# Patient Record
Sex: Male | Born: 1965 | Race: Black or African American | Hispanic: No | Smoking: Current every day smoker
Health system: Southern US, Community
[De-identification: ages and names within clinical notes are randomized; demographics above are authoritative.]

## PROBLEM LIST (undated history)

## (undated) DIAGNOSIS — G2581 Restless legs syndrome: Secondary | ICD-10-CM

## (undated) DIAGNOSIS — F32A Depression, unspecified: Secondary | ICD-10-CM

## (undated) DIAGNOSIS — F419 Anxiety disorder, unspecified: Secondary | ICD-10-CM

## (undated) DIAGNOSIS — E669 Obesity, unspecified: Secondary | ICD-10-CM

## (undated) DIAGNOSIS — E781 Pure hyperglyceridemia: Secondary | ICD-10-CM

## (undated) DIAGNOSIS — J309 Allergic rhinitis, unspecified: Secondary | ICD-10-CM

## (undated) DIAGNOSIS — M549 Dorsalgia, unspecified: Secondary | ICD-10-CM

## (undated) DIAGNOSIS — M5136 Other intervertebral disc degeneration, lumbar region: Secondary | ICD-10-CM

## (undated) DIAGNOSIS — E8881 Metabolic syndrome: Secondary | ICD-10-CM

## (undated) DIAGNOSIS — M109 Gout, unspecified: Secondary | ICD-10-CM

## (undated) DIAGNOSIS — G4733 Obstructive sleep apnea (adult) (pediatric): Secondary | ICD-10-CM

## (undated) DIAGNOSIS — K219 Gastro-esophageal reflux disease without esophagitis: Secondary | ICD-10-CM

## (undated) DIAGNOSIS — R519 Headache, unspecified: Secondary | ICD-10-CM

## (undated) DIAGNOSIS — F329 Major depressive disorder, single episode, unspecified: Secondary | ICD-10-CM

## (undated) DIAGNOSIS — N529 Male erectile dysfunction, unspecified: Secondary | ICD-10-CM

## (undated) DIAGNOSIS — T7840XA Allergy, unspecified, initial encounter: Secondary | ICD-10-CM

## (undated) DIAGNOSIS — M5432 Sciatica, left side: Secondary | ICD-10-CM

## (undated) DIAGNOSIS — Z8719 Personal history of other diseases of the digestive system: Secondary | ICD-10-CM

## (undated) DIAGNOSIS — R51 Headache: Secondary | ICD-10-CM

## (undated) DIAGNOSIS — G629 Polyneuropathy, unspecified: Secondary | ICD-10-CM

## (undated) DIAGNOSIS — K5900 Constipation, unspecified: Secondary | ICD-10-CM

## (undated) DIAGNOSIS — E785 Hyperlipidemia, unspecified: Secondary | ICD-10-CM

## (undated) DIAGNOSIS — E119 Type 2 diabetes mellitus without complications: Secondary | ICD-10-CM

## (undated) DIAGNOSIS — I10 Essential (primary) hypertension: Secondary | ICD-10-CM

## (undated) DIAGNOSIS — G8929 Other chronic pain: Secondary | ICD-10-CM

## (undated) DIAGNOSIS — S060X9A Concussion with loss of consciousness of unspecified duration, initial encounter: Secondary | ICD-10-CM

## (undated) HISTORY — DX: Other chronic pain: G89.29

## (undated) HISTORY — PX: ELBOW SURGERY: SHX618

## (undated) HISTORY — DX: Dorsalgia, unspecified: M54.9

## (undated) HISTORY — PX: TONSILLECTOMY AND ADENOIDECTOMY: SUR1326

## (undated) HISTORY — DX: Allergic rhinitis, unspecified: J30.9

## (undated) HISTORY — PX: SHOULDER SURGERY: SHX246

## (undated) HISTORY — PX: COLONOSCOPY: SHX174

## (undated) HISTORY — DX: Other intervertebral disc degeneration, lumbar region: M51.36

## (undated) HISTORY — DX: Male erectile dysfunction, unspecified: N52.9

## (undated) HISTORY — DX: Obesity, unspecified: E66.9

## (undated) HISTORY — DX: Major depressive disorder, single episode, unspecified: F32.9

## (undated) HISTORY — DX: Obstructive sleep apnea (adult) (pediatric): G47.33

## (undated) HISTORY — DX: Depression, unspecified: F32.A

## (undated) HISTORY — DX: Sciatica, left side: M54.32

## (undated) HISTORY — DX: Personal history of other diseases of the digestive system: Z87.19

## (undated) HISTORY — DX: Gastro-esophageal reflux disease without esophagitis: K21.9

## (undated) HISTORY — DX: Hyperlipidemia, unspecified: E78.5

## (undated) HISTORY — DX: Metabolic syndrome: E88.81

## (undated) HISTORY — DX: Essential (primary) hypertension: I10

## (undated) HISTORY — PX: KNEE SURGERY: SHX244

## (undated) HISTORY — DX: Gout, unspecified: M10.9

## (undated) HISTORY — DX: Allergy, unspecified, initial encounter: T78.40XA

## (undated) HISTORY — DX: Pure hyperglyceridemia: E78.1

---

## 2001-05-29 ENCOUNTER — Emergency Department (HOSPITAL_COMMUNITY): Admission: EM | Admit: 2001-05-29 | Discharge: 2001-05-29 | Payer: Self-pay

## 2001-06-06 ENCOUNTER — Encounter: Admission: RE | Admit: 2001-06-06 | Discharge: 2001-07-04 | Payer: Self-pay | Admitting: Family Medicine

## 2001-06-22 ENCOUNTER — Encounter: Admission: RE | Admit: 2001-06-22 | Discharge: 2001-06-22 | Payer: Self-pay | Admitting: Family Medicine

## 2001-06-22 ENCOUNTER — Encounter: Payer: Self-pay | Admitting: Family Medicine

## 2001-07-20 ENCOUNTER — Encounter: Admission: RE | Admit: 2001-07-20 | Discharge: 2001-10-18 | Payer: Self-pay

## 2002-02-07 ENCOUNTER — Emergency Department (HOSPITAL_COMMUNITY): Admission: EM | Admit: 2002-02-07 | Discharge: 2002-02-07 | Payer: Self-pay

## 2003-02-18 ENCOUNTER — Encounter: Admission: RE | Admit: 2003-02-18 | Discharge: 2003-02-18 | Payer: Self-pay | Admitting: Family Medicine

## 2003-05-06 ENCOUNTER — Encounter: Admission: RE | Admit: 2003-05-06 | Discharge: 2003-05-06 | Payer: Self-pay | Admitting: Otolaryngology

## 2003-05-22 ENCOUNTER — Ambulatory Visit (HOSPITAL_COMMUNITY): Admission: RE | Admit: 2003-05-22 | Discharge: 2003-05-22 | Payer: Self-pay | Admitting: Otolaryngology

## 2003-05-22 ENCOUNTER — Encounter (INDEPENDENT_AMBULATORY_CARE_PROVIDER_SITE_OTHER): Payer: Self-pay | Admitting: *Deleted

## 2004-02-10 ENCOUNTER — Ambulatory Visit: Payer: Self-pay | Admitting: Family Medicine

## 2004-04-02 ENCOUNTER — Ambulatory Visit: Payer: Self-pay | Admitting: Family Medicine

## 2004-05-31 ENCOUNTER — Ambulatory Visit: Payer: Self-pay | Admitting: Family Medicine

## 2004-06-14 ENCOUNTER — Ambulatory Visit: Payer: Self-pay | Admitting: Family Medicine

## 2004-08-13 ENCOUNTER — Ambulatory Visit: Payer: Self-pay | Admitting: Family Medicine

## 2004-08-15 ENCOUNTER — Encounter: Admission: RE | Admit: 2004-08-15 | Discharge: 2004-08-15 | Payer: Self-pay | Admitting: Family Medicine

## 2004-08-21 ENCOUNTER — Encounter: Admission: RE | Admit: 2004-08-21 | Discharge: 2004-08-21 | Payer: Self-pay | Admitting: Family Medicine

## 2004-08-26 ENCOUNTER — Ambulatory Visit: Payer: Self-pay | Admitting: Family Medicine

## 2004-11-18 ENCOUNTER — Ambulatory Visit: Payer: Self-pay | Admitting: Family Medicine

## 2004-12-06 ENCOUNTER — Ambulatory Visit: Payer: Self-pay | Admitting: Family Medicine

## 2004-12-15 ENCOUNTER — Emergency Department (HOSPITAL_COMMUNITY): Admission: EM | Admit: 2004-12-15 | Discharge: 2004-12-15 | Payer: Self-pay | Admitting: Emergency Medicine

## 2004-12-16 ENCOUNTER — Ambulatory Visit: Payer: Self-pay | Admitting: Family Medicine

## 2005-01-06 ENCOUNTER — Ambulatory Visit: Payer: Self-pay | Admitting: Family Medicine

## 2005-01-10 ENCOUNTER — Encounter: Admission: RE | Admit: 2005-01-10 | Discharge: 2005-01-10 | Payer: Self-pay | Admitting: Specialist

## 2005-01-12 ENCOUNTER — Encounter: Admission: RE | Admit: 2005-01-12 | Discharge: 2005-01-12 | Payer: Self-pay | Admitting: Specialist

## 2005-01-18 ENCOUNTER — Encounter: Admission: RE | Admit: 2005-01-18 | Discharge: 2005-01-18 | Payer: Self-pay | Admitting: Specialist

## 2005-02-22 ENCOUNTER — Ambulatory Visit: Payer: Self-pay | Admitting: Family Medicine

## 2005-05-26 ENCOUNTER — Ambulatory Visit: Payer: Self-pay | Admitting: Family Medicine

## 2005-07-12 ENCOUNTER — Ambulatory Visit: Payer: Self-pay | Admitting: Family Medicine

## 2005-09-12 ENCOUNTER — Ambulatory Visit: Payer: Self-pay | Admitting: Family Medicine

## 2006-10-24 DIAGNOSIS — E8881 Metabolic syndrome: Secondary | ICD-10-CM

## 2006-10-24 DIAGNOSIS — E669 Obesity, unspecified: Secondary | ICD-10-CM | POA: Insufficient documentation

## 2006-10-24 HISTORY — DX: Metabolic syndrome: E88.81

## 2006-12-21 ENCOUNTER — Telehealth: Payer: Self-pay | Admitting: Family Medicine

## 2007-05-01 ENCOUNTER — Ambulatory Visit: Payer: Self-pay | Admitting: Family Medicine

## 2007-05-01 DIAGNOSIS — M109 Gout, unspecified: Secondary | ICD-10-CM

## 2007-05-01 DIAGNOSIS — IMO0002 Reserved for concepts with insufficient information to code with codable children: Secondary | ICD-10-CM | POA: Insufficient documentation

## 2007-05-01 HISTORY — DX: Gout, unspecified: M10.9

## 2007-05-04 ENCOUNTER — Ambulatory Visit: Payer: Self-pay | Admitting: Family Medicine

## 2007-05-15 ENCOUNTER — Telehealth: Payer: Self-pay | Admitting: Family Medicine

## 2007-06-21 ENCOUNTER — Telehealth (INDEPENDENT_AMBULATORY_CARE_PROVIDER_SITE_OTHER): Payer: Self-pay | Admitting: *Deleted

## 2007-06-29 ENCOUNTER — Ambulatory Visit: Payer: Self-pay | Admitting: Family Medicine

## 2007-06-29 DIAGNOSIS — J069 Acute upper respiratory infection, unspecified: Secondary | ICD-10-CM | POA: Insufficient documentation

## 2007-08-28 ENCOUNTER — Telehealth: Payer: Self-pay | Admitting: *Deleted

## 2007-08-30 ENCOUNTER — Emergency Department (HOSPITAL_COMMUNITY): Admission: EM | Admit: 2007-08-30 | Discharge: 2007-08-31 | Payer: Self-pay | Admitting: Emergency Medicine

## 2007-09-13 ENCOUNTER — Telehealth: Payer: Self-pay | Admitting: Family Medicine

## 2007-09-13 ENCOUNTER — Encounter: Payer: Self-pay | Admitting: *Deleted

## 2007-10-19 ENCOUNTER — Emergency Department (HOSPITAL_COMMUNITY): Admission: EM | Admit: 2007-10-19 | Discharge: 2007-10-19 | Payer: Self-pay | Admitting: Emergency Medicine

## 2007-10-23 ENCOUNTER — Telehealth: Payer: Self-pay | Admitting: Internal Medicine

## 2007-11-08 ENCOUNTER — Ambulatory Visit: Payer: Self-pay | Admitting: Family Medicine

## 2007-11-08 LAB — CONVERTED CEMR LAB
BUN: 11 mg/dL (ref 6–23)
CO2: 29 meq/L (ref 19–32)
Calcium: 9.6 mg/dL (ref 8.4–10.5)
Chloride: 101 meq/L (ref 96–112)
Creatinine, Ser: 1 mg/dL (ref 0.4–1.5)
GFR calc Af Amer: 106 mL/min
GFR calc non Af Amer: 88 mL/min
Glucose, Bld: 109 mg/dL — ABNORMAL HIGH (ref 70–99)
Potassium: 3.8 meq/L (ref 3.5–5.1)
Sodium: 140 meq/L (ref 135–145)
Uric Acid, Serum: 4.6 mg/dL (ref 4.0–7.8)

## 2007-11-10 ENCOUNTER — Telehealth: Payer: Self-pay | Admitting: Family Medicine

## 2008-01-01 ENCOUNTER — Telehealth: Payer: Self-pay | Admitting: *Deleted

## 2008-01-24 ENCOUNTER — Emergency Department (HOSPITAL_COMMUNITY): Admission: EM | Admit: 2008-01-24 | Discharge: 2008-01-24 | Payer: Self-pay | Admitting: Emergency Medicine

## 2008-02-20 ENCOUNTER — Ambulatory Visit: Payer: Self-pay | Admitting: Family Medicine

## 2008-02-20 LAB — CONVERTED CEMR LAB: Uric Acid, Serum: 2.3 mg/dL — ABNORMAL LOW (ref 4.0–7.8)

## 2008-03-07 ENCOUNTER — Telehealth: Payer: Self-pay | Admitting: Family Medicine

## 2008-03-09 ENCOUNTER — Emergency Department (HOSPITAL_COMMUNITY): Admission: EM | Admit: 2008-03-09 | Discharge: 2008-03-09 | Payer: Self-pay | Admitting: Emergency Medicine

## 2008-03-11 ENCOUNTER — Ambulatory Visit: Payer: Self-pay | Admitting: Family Medicine

## 2008-03-11 DIAGNOSIS — F32A Depression, unspecified: Secondary | ICD-10-CM | POA: Insufficient documentation

## 2008-03-11 DIAGNOSIS — I10 Essential (primary) hypertension: Secondary | ICD-10-CM

## 2008-03-11 DIAGNOSIS — M79609 Pain in unspecified limb: Secondary | ICD-10-CM | POA: Insufficient documentation

## 2008-03-11 DIAGNOSIS — F329 Major depressive disorder, single episode, unspecified: Secondary | ICD-10-CM

## 2008-03-11 HISTORY — DX: Essential (primary) hypertension: I10

## 2008-04-21 ENCOUNTER — Emergency Department (HOSPITAL_COMMUNITY): Admission: EM | Admit: 2008-04-21 | Discharge: 2008-04-21 | Payer: Self-pay | Admitting: Emergency Medicine

## 2008-04-21 DIAGNOSIS — M25519 Pain in unspecified shoulder: Secondary | ICD-10-CM | POA: Insufficient documentation

## 2008-04-25 ENCOUNTER — Ambulatory Visit: Payer: Self-pay | Admitting: Family Medicine

## 2008-06-20 ENCOUNTER — Ambulatory Visit: Payer: Self-pay | Admitting: Family Medicine

## 2008-07-02 ENCOUNTER — Emergency Department (HOSPITAL_COMMUNITY): Admission: EM | Admit: 2008-07-02 | Discharge: 2008-07-02 | Payer: Self-pay | Admitting: Emergency Medicine

## 2008-07-07 ENCOUNTER — Telehealth: Payer: Self-pay | Admitting: Family Medicine

## 2008-07-17 ENCOUNTER — Ambulatory Visit (HOSPITAL_COMMUNITY): Admission: RE | Admit: 2008-07-17 | Discharge: 2008-07-17 | Payer: Self-pay | Admitting: Orthopedic Surgery

## 2008-08-08 ENCOUNTER — Ambulatory Visit (HOSPITAL_COMMUNITY): Admission: RE | Admit: 2008-08-08 | Discharge: 2008-08-08 | Payer: Self-pay | Admitting: Orthopedic Surgery

## 2008-09-19 ENCOUNTER — Telehealth: Payer: Self-pay | Admitting: Family Medicine

## 2008-09-30 ENCOUNTER — Emergency Department (HOSPITAL_COMMUNITY): Admission: EM | Admit: 2008-09-30 | Discharge: 2008-09-30 | Payer: Self-pay | Admitting: Emergency Medicine

## 2009-03-19 ENCOUNTER — Emergency Department (HOSPITAL_COMMUNITY): Admission: EM | Admit: 2009-03-19 | Discharge: 2009-03-19 | Payer: Self-pay | Admitting: Emergency Medicine

## 2009-05-07 ENCOUNTER — Observation Stay (HOSPITAL_COMMUNITY): Admission: EM | Admit: 2009-05-07 | Discharge: 2009-05-08 | Payer: Self-pay | Admitting: Emergency Medicine

## 2009-05-19 ENCOUNTER — Ambulatory Visit: Payer: Self-pay | Admitting: Family Medicine

## 2009-05-19 DIAGNOSIS — Z8719 Personal history of other diseases of the digestive system: Secondary | ICD-10-CM | POA: Insufficient documentation

## 2009-05-19 LAB — CONVERTED CEMR LAB
ALT: 23 units/L (ref 0–53)
AST: 22 units/L (ref 0–37)
Albumin: 4.1 g/dL (ref 3.5–5.2)
Alkaline Phosphatase: 62 units/L (ref 39–117)
BUN: 14 mg/dL (ref 6–23)
Basophils Relative: 0 % (ref 0.0–3.0)
Bilirubin, Direct: 0 mg/dL (ref 0.0–0.3)
CO2: 28 meq/L (ref 19–32)
Calcium: 9.8 mg/dL (ref 8.4–10.5)
Chloride: 101 meq/L (ref 96–112)
Cholesterol: 280 mg/dL — ABNORMAL HIGH (ref 0–200)
Creatinine, Ser: 1.1 mg/dL (ref 0.4–1.5)
Direct LDL: 68.4 mg/dL
Eosinophils Relative: 0 % (ref 0.0–5.0)
GFR calc non Af Amer: 94.49 mL/min (ref >60)
Glucose, Bld: 93 mg/dL (ref 70–99)
HCT: 41.8 % (ref 39.0–52.0)
HDL: 36 mg/dL — ABNORMAL LOW (ref >39.00)
Hemoglobin: 14.4 g/dL (ref 13.0–17.0)
Lymphocytes Relative: 24 % (ref 12.0–46.0)
MCHC: 34.4 g/dL (ref 30.0–36.0)
MCV: 98.8 fL (ref 78.0–100.0)
Monocytes Relative: 2 % — ABNORMAL LOW (ref 3.0–12.0)
Neutrophils Relative %: 74 % (ref 43.0–77.0)
Platelets: 483 10*3/uL — ABNORMAL HIGH (ref 150.0–400.0)
Potassium: 4.5 meq/L (ref 3.5–5.1)
RBC: 4.23 M/uL (ref 4.22–5.81)
RDW: 13.4 % (ref 11.5–14.6)
Sodium: 137 meq/L (ref 135–145)
TSH: 0.25 microintl units/mL — ABNORMAL LOW (ref 0.35–5.50)
Total Bilirubin: 0 mg/dL — ABNORMAL LOW (ref 0.3–1.2)
Total CHOL/HDL Ratio: 8
Total Protein: 7 g/dL (ref 6.0–8.3)
Triglycerides: 1685 mg/dL — ABNORMAL HIGH (ref 0.0–149.0)
VLDL: 337 mg/dL — ABNORMAL HIGH (ref 0.0–40.0)
WBC: 10.2 10*3/uL (ref 4.5–10.5)

## 2009-05-26 ENCOUNTER — Ambulatory Visit: Payer: Self-pay | Admitting: Family Medicine

## 2009-05-26 DIAGNOSIS — E781 Pure hyperglyceridemia: Secondary | ICD-10-CM

## 2009-05-26 HISTORY — DX: Pure hyperglyceridemia: E78.1

## 2009-06-18 ENCOUNTER — Telehealth: Payer: Self-pay | Admitting: Family Medicine

## 2009-07-03 ENCOUNTER — Telehealth: Payer: Self-pay | Admitting: Family Medicine

## 2009-07-29 ENCOUNTER — Telehealth: Payer: Self-pay | Admitting: Gastroenterology

## 2009-07-30 ENCOUNTER — Ambulatory Visit: Payer: Self-pay | Admitting: Family Medicine

## 2009-07-30 DIAGNOSIS — B372 Candidiasis of skin and nail: Secondary | ICD-10-CM | POA: Insufficient documentation

## 2009-07-31 ENCOUNTER — Telehealth: Payer: Self-pay | Admitting: Family Medicine

## 2009-07-31 LAB — CONVERTED CEMR LAB
Cholesterol: 229 mg/dL — ABNORMAL HIGH (ref 0–200)
Direct LDL: 60.7 mg/dL
HDL: 45 mg/dL (ref >39.00)
Total CHOL/HDL Ratio: 5
Triglycerides: 923 mg/dL — ABNORMAL HIGH (ref 0.0–149.0)
VLDL: 184.6 mg/dL — ABNORMAL HIGH (ref 0.0–40.0)

## 2009-09-04 ENCOUNTER — Telehealth: Payer: Self-pay | Admitting: Family Medicine

## 2009-09-07 ENCOUNTER — Telehealth: Payer: Self-pay | Admitting: Family Medicine

## 2009-09-11 ENCOUNTER — Telehealth: Payer: Self-pay | Admitting: Family Medicine

## 2009-10-04 DIAGNOSIS — T07XXXA Unspecified multiple injuries, initial encounter: Secondary | ICD-10-CM | POA: Insufficient documentation

## 2009-10-05 ENCOUNTER — Emergency Department (HOSPITAL_COMMUNITY): Admission: EM | Admit: 2009-10-05 | Discharge: 2009-10-05 | Payer: Self-pay | Admitting: Emergency Medicine

## 2009-10-06 ENCOUNTER — Ambulatory Visit: Payer: Self-pay | Admitting: Family Medicine

## 2009-10-20 ENCOUNTER — Telehealth: Payer: Self-pay | Admitting: Internal Medicine

## 2009-10-29 ENCOUNTER — Telehealth: Payer: Self-pay | Admitting: Family Medicine

## 2009-12-04 ENCOUNTER — Ambulatory Visit: Payer: Self-pay | Admitting: Family Medicine

## 2009-12-11 ENCOUNTER — Ambulatory Visit: Payer: Self-pay | Admitting: Family Medicine

## 2010-01-11 ENCOUNTER — Emergency Department (HOSPITAL_COMMUNITY)
Admission: EM | Admit: 2010-01-11 | Discharge: 2010-01-11 | Payer: Self-pay | Source: Home / Self Care | Admitting: Emergency Medicine

## 2010-01-29 ENCOUNTER — Ambulatory Visit: Payer: Self-pay | Admitting: Family Medicine

## 2010-01-29 DIAGNOSIS — H109 Unspecified conjunctivitis: Secondary | ICD-10-CM | POA: Insufficient documentation

## 2010-01-29 DIAGNOSIS — R300 Dysuria: Secondary | ICD-10-CM | POA: Insufficient documentation

## 2010-01-29 LAB — CONVERTED CEMR LAB
Bilirubin Urine: NEGATIVE
Glucose, Urine, Semiquant: NEGATIVE
Ketones, urine, test strip: NEGATIVE
Nitrite: NEGATIVE
Protein, U semiquant: 30
Specific Gravity, Urine: <1.005
Urobilinogen, UA: 0.2
pH: 7

## 2010-01-30 ENCOUNTER — Encounter: Payer: Self-pay | Admitting: Family Medicine

## 2010-02-23 ENCOUNTER — Ambulatory Visit: Payer: Self-pay | Admitting: Family Medicine

## 2010-02-23 DIAGNOSIS — J309 Allergic rhinitis, unspecified: Secondary | ICD-10-CM | POA: Insufficient documentation

## 2010-02-23 LAB — CONVERTED CEMR LAB
ALT: 30 units/L (ref 0–53)
AST: 46 units/L — ABNORMAL HIGH (ref 0–37)
Albumin: 4.5 g/dL (ref 3.5–5.2)
Alkaline Phosphatase: 77 units/L (ref 39–117)
Bilirubin, Direct: 0 mg/dL (ref 0.0–0.3)
Cholesterol: 242 mg/dL — ABNORMAL HIGH (ref 0–200)
HDL: 33.9 mg/dL — ABNORMAL LOW (ref >39.00)
Total Bilirubin: 0.2 mg/dL — ABNORMAL LOW (ref 0.3–1.2)
Total CHOL/HDL Ratio: 7
Total Protein: 6.9 g/dL (ref 6.0–8.3)
Triglycerides: 2924 mg/dL — ABNORMAL HIGH (ref 0.0–149.0)
VLDL: 584.8 mg/dL — ABNORMAL HIGH (ref 0.0–40.0)

## 2010-03-02 ENCOUNTER — Ambulatory Visit: Payer: Self-pay | Admitting: Family Medicine

## 2010-03-08 ENCOUNTER — Ambulatory Visit: Payer: Self-pay | Admitting: Cardiology

## 2010-03-22 ENCOUNTER — Emergency Department (HOSPITAL_COMMUNITY)
Admission: EM | Admit: 2010-03-22 | Discharge: 2010-03-23 | Payer: Self-pay | Source: Home / Self Care | Admitting: Emergency Medicine

## 2010-03-23 DIAGNOSIS — F17200 Nicotine dependence, unspecified, uncomplicated: Secondary | ICD-10-CM | POA: Insufficient documentation

## 2010-03-24 ENCOUNTER — Telehealth: Payer: Self-pay | Admitting: Family Medicine

## 2010-03-26 ENCOUNTER — Inpatient Hospital Stay (HOSPITAL_COMMUNITY)
Admission: EM | Admit: 2010-03-26 | Discharge: 2010-03-29 | Payer: Self-pay | Source: Home / Self Care | Attending: Cardiology | Admitting: Cardiology

## 2010-03-26 ENCOUNTER — Encounter: Payer: Self-pay | Admitting: Cardiology

## 2010-04-01 ENCOUNTER — Ambulatory Visit
Admission: RE | Admit: 2010-04-01 | Discharge: 2010-04-01 | Payer: Self-pay | Source: Home / Self Care | Attending: Family Medicine | Admitting: Family Medicine

## 2010-04-01 DIAGNOSIS — I319 Disease of pericardium, unspecified: Secondary | ICD-10-CM | POA: Insufficient documentation

## 2010-04-05 ENCOUNTER — Ambulatory Visit: Admission: RE | Admit: 2010-04-05 | Discharge: 2010-04-05 | Payer: Self-pay | Source: Home / Self Care

## 2010-04-05 ENCOUNTER — Other Ambulatory Visit: Payer: Self-pay | Admitting: Cardiology

## 2010-04-05 LAB — HEPATIC FUNCTION PANEL
ALT: 19 U/L (ref 0–53)
AST: 24 U/L (ref 0–37)
Albumin: 3.4 g/dL — ABNORMAL LOW (ref 3.5–5.2)
Alkaline Phosphatase: 69 U/L (ref 39–117)
Bilirubin, Direct: 0.3 mg/dL (ref 0.0–0.3)
Total Bilirubin: 0.7 mg/dL (ref 0.3–1.2)
Total Protein: 6.7 g/dL (ref 6.0–8.3)

## 2010-04-05 LAB — LIPID PANEL
Cholesterol: 74 mg/dL (ref 0–200)
HDL: 17.6 mg/dL — ABNORMAL LOW (ref >39.00)
LDL Cholesterol: 45 mg/dL (ref 0–99)
Total CHOL/HDL Ratio: 4
Triglycerides: 56 mg/dL (ref 0.0–149.0)
VLDL: 11.2 mg/dL (ref 0.0–40.0)

## 2010-04-05 LAB — BASIC METABOLIC PANEL
BUN: 10 mg/dL (ref 6–23)
CO2: 27 mEq/L (ref 19–32)
Calcium: 9.3 mg/dL (ref 8.4–10.5)
Chloride: 100 mEq/L (ref 96–112)
Creatinine, Ser: 0.8 mg/dL (ref 0.4–1.5)
GFR: 144.17 mL/min (ref >60.00)
Glucose, Bld: 96 mg/dL (ref 70–99)
Potassium: 4.7 mEq/L (ref 3.5–5.1)
Sodium: 135 mEq/L (ref 135–145)

## 2010-04-07 ENCOUNTER — Ambulatory Visit
Admission: RE | Admit: 2010-04-07 | Discharge: 2010-04-07 | Payer: Self-pay | Source: Home / Self Care | Attending: Family Medicine | Admitting: Family Medicine

## 2010-04-08 ENCOUNTER — Ambulatory Visit
Admission: RE | Admit: 2010-04-08 | Discharge: 2010-04-08 | Payer: Self-pay | Source: Home / Self Care | Attending: Physician Assistant | Admitting: Physician Assistant

## 2010-04-08 ENCOUNTER — Encounter: Payer: Self-pay | Admitting: Physician Assistant

## 2010-04-08 ENCOUNTER — Ambulatory Visit: Admission: RE | Admit: 2010-04-08 | Discharge: 2010-04-08 | Payer: Self-pay | Source: Home / Self Care

## 2010-04-08 DIAGNOSIS — R9431 Abnormal electrocardiogram [ECG] [EKG]: Secondary | ICD-10-CM

## 2010-04-08 DIAGNOSIS — I300 Acute nonspecific idiopathic pericarditis: Secondary | ICD-10-CM

## 2010-04-08 HISTORY — DX: Acute nonspecific idiopathic pericarditis: I30.0

## 2010-04-08 HISTORY — DX: Abnormal electrocardiogram (ECG) (EKG): R94.31

## 2010-04-17 ENCOUNTER — Encounter: Payer: Self-pay | Admitting: Family Medicine

## 2010-04-18 ENCOUNTER — Encounter: Payer: Self-pay | Admitting: Family Medicine

## 2010-04-22 NOTE — H&P (Addendum)
Harold [name], Harold [name] NO.:  [id number]  MEDICAL RECORD NO.:  [id number]          PATIENT TYPE:  INP  LOCATION:  2034                         FACILITY:  [facility]  PHYSICIAN:  Rollene Rotunda, MD, FACCDATE OF BIRTH:  October 19, 1965  DATE OF ADMISSION:  03/26/2010 DATE OF DISCHARGE:                             HISTORY & PHYSICAL   PRIMARY CARDIOLOGIST:  New patient to [facility], being seen by Dr. Antoine Poche.  PRIMARY CARE PHYSICIAN:  Tinnie Gens A. Tawanna Cooler, MD  CHIEF COMPLAINT:  Abdominal, chest and neck pain.  HISTORY OF PRESENT ILLNESS:  This is a 45 year old African American male with history of hypertension, dyslipidemia, and pancreatitis who was evaluated in the emergency department 3 days ago for complaints of abdominal pain.  The patient was worked up for gastritis and gastroesophageal reflux disease and given Prilosec and Percocet for pain management.  The patient states his symptoms have continued to get worse.  His chest pain is sore and now radiates up to his neck.  He has upper abdominal pain.  Pain is worse with inspiration as well as lying down.  The patient presented to the emergency department this morning with increased pain and inability to sleep secondary to pain and not being able to lie down.  A stat echo was ordered for questionable pericarditis.  Dr. Antoine Poche has reviewed this echocardiogram, which did reveal pericardial effusion.  The patient has been given aspirin and Dilaudid in the emergency department.  He is still with some pain.  EKG does show ST upsloping in inferior leads.  The patient has been admitted by the Cardiology Service for treatment of pericarditis.  PAST MEDICAL HISTORY: 1. Obesity. 2. Hypertension. 3. Depression/anxiety. 4. Dyslipidemia. 5. Gastroesophageal reflux disease. 6. Tobacco abuse. 7. History of alcohol abuse. 8. Acute pancreatitis in February 2011. 9. Back pain, chronic. 10.Status post tonsillectomy and  adenoidectomy. 11.Status post right shoulder arthroscopy.  SOCIAL HISTORY:  The patient lives in [place] with his fiancee.  He is working on obtaining disability for chronic pain issues.  He has a 20- pack-year smoking history and continues to smoke between a half and one pack a day.  He does consume alcohol several times a week, mostly on the weekends.  FAMILY HISTORY:  Contributory for early coronary artery disease.  His mother does have diabetes and hypertension.  He has father with pancreatitis.  ALLERGIES/INTOLERANCES: 1. COLCHICINE causing diarrhea. 2. BENADRYL making the patient feel crazy.  OUTPATIENT MEDICATIONS: 1. Lisinopril/hydrochlorothiazide 20/25 mg daily. 2. Crestor 20 mg daily. 3. Celexa 40 mg daily. 4. Bystolic 5 mg daily. 5. Fenofibrate 160 mg daily. 6. Fish oil 1000 mg capsules two capsules daily. 7. Vicodin ES 7.5/750 mg four times a day.  REVIEW OF SYSTEMS:  All pertinent positives and negatives as stated in the HPI.  All other systems have been reviewed and are negative.  CODE STATUS:  Full.  PHYSICAL EXAMINATION:  VITAL SIGNS:  Temperature 98.2, pulse 72, respirations 19, blood pressure 150/89, O2 saturation 96% on room air. GENERAL:  This is an obese, visibly uncomfortable gentleman.  He is drowsy secondary to pain medication. HEENT:  Normocephalic with  darkening skin above the brow. NECK:  Supple without bruit or JVD. HEART:  Regular rate and rhythm with S1 and S2.  There are no rubs noted.  No murmurs.  PMI is normal.  Pulses are 2+ and equal bilaterally. LUNGS:  Clear to auscultation bilaterally without wheezes, rales or rhonchi. ABDOMEN:  Obese, nontender, positive bowel sounds x4. EXTREMITIES:  No clubbing, cyanosis or edema. MUSCULOSKELETAL:  No joint deformities. NEUROLOGIC:  Alert and oriented x3, cranial nerves II through XII grossly intact.  Chest x-ray pending.  EKG showing sinus rhythm at a rate of 69 beats per minute.  Axis  is normal.  There are inferior and lateral upsloping ST segment elevation.  LABORATORY DATA:  WBC 11.4, hemoglobin 11.5, hematocrit 35.2, platelets 355.  Sodium 134, potassium 3.7, chloride 98, bicarb 25, BUN 11, creatinine 0.94.  Liver enzymes normal.  Point-of-care negative except for elevated myoglobin at 239, lipase 28.  ASSESSMENT AND PLAN:  This is a 45 year old gentleman who presents with 5 days of pleuritic chest discomfort as well as abdominal discomfort. EKG with ST elevations in the inferior and lateral leads.  Bedside echo showed pericardial effusion, therefore, the patient will be admitted to telemetry for treatment of pericarditis.  He will be started on indomethacin and colchicine.  If colchicine does cause diarrhea, this can be discontinued.  We will manage the patient's pain.  EKG will be obtained in the morning.  No heparin or aspirin will be used secondary to possibility of hemorrhagic effusion as wells the patient's anemia.  Upon reviewing lab results on March 22, 2010, hemoglobin/hematocrit have fallen.  We will follow CBCs closely and check stool guaiac.  The patient will be continued on home medications for hypertension, depression, and dyslipidemia.  Venous thromboembolism prophylaxis will be with SCDs.     Leonette Monarch, PA-C   ______________________________ Rollene Rotunda, MD, [facility]    NB/MEDQ  D:  03/26/2010  T:  03/27/2010  Job:  [id number]  Electronically Signed by Alen Blew P.A. on 04/08/2010 08:16:35 AM Electronically Signed by Rollene Rotunda MD [facility] on 04/22/2010 12:17:30 PM

## 2010-04-22 NOTE — Discharge Summary (Addendum)
  NAME:  [name], [name] NO.:  [id number]  MEDICAL RECORD NO.:  [id number]           PATIENT TYPE:  LOCATION:                                 FACILITY:  PHYSICIAN:  Rollene Rotunda, MD, FACCDATE OF BIRTH:  1966-03-16  DATE OF ADMISSION: DATE OF DISCHARGE:                              DISCHARGE SUMMARY   Dr. Antoine Poche reviewed Harold [name] medications.  It was recommended that he be discharged on a higher dose of lisinopril and to discontinue the hydralazine.  To that end, the discharge medications were changed to reflect this.  The lisinopril HCTZ 20/12.5 is discontinued.  He is to start lisinopril HCTZ 20/12.5 two tabs daily.  Hydralazine was a new medication for him and this is discontinued.  All other discharge plans and medications are unchanged.     Harold Demark, PA-C   ______________________________ Rollene Rotunda, MD, [facility]    RB/MEDQ  D:  03/29/2010  T:  03/30/2010  Job:  [id number] Electronically Signed by Harold Demark PA-C on 04/05/2010 03:53:49 PM Electronically Signed by Rollene Rotunda MD [facility] on 04/22/2010 12:17:14 PM

## 2010-04-22 NOTE — Discharge Summary (Addendum)
NAMEFERGUSON, GERTNER NO.:  [id number]  MEDICAL RECORD NO.:  [id number]          PATIENT TYPE:  INP  LOCATION:  2034                         FACILITY:  [facility]  PHYSICIAN:  Rollene Rotunda, MD, FACCDATE OF BIRTH:  10-27-1965  DATE OF ADMISSION:  03/26/2010 DATE OF DISCHARGE:  03/29/2010                              DISCHARGE SUMMARY   PROCEDURES: 1. Two-view chest x-ray. 2. Two-D echocardiogram.  PRIMARY FINAL DISCHARGE DIAGNOSIS:  Acute pericarditis.  SECONDARY DIAGNOSES: 1. Hypertension. 2. Dyslipidemia. 3. Obesity. 4. Depression/anxiety. 5. Gastroesophageal reflux disease. 6. Ongoing tobacco use. 7. History of ethyl alcohol use. 8. History of pancreatitis in February 2011. 9. Chronic back pain. 10.Status post tonsillectomy, right knee surgery, right rotator cuff     repair, left elbow surgery, and left shoulder surgery.  TIME AT DISCHARGE:  34 minutes.  HOSPITAL COURSE:  Mr. Parisien is a 45 year old male with no previous history of coronary artery disease.  He had abdominal, chest, and neck pain and came to the hospital where he was admitted for further evaluation and treatment.  His CKs were up somewhat, but his MBs and troponins were all negative. TSH was within normal limits.  His blood sugars were mildly elevated between 103-112 and his white count was up a little bit with mild anemia, hemoglobin 11.5.  A chest x-ray showed some interval enlargement of the cardiac silhouette and possible venous hypertension versus early interstitial fluid.  On echo, his EF was 55-60% with a mild to moderate pericardial effusion, but no significant RV diastolic collapse and less than 25% respirophasic variation of the mitral inflow E velocity.  There was no evidence of tamponade.  He was treated with NSAIDs and his condition improved.  He was continued on his own blood pressure medications with adjustments for better blood pressure control.  By March 29, 2010, Mr. Eckardt condition had improved.  He was evaluated by Dr. Antoine Poche and considered stable for discharge, to follow up as an outpatient.  DISCHARGE INSTRUCTIONS: 1. His activity level is to be increased gradually. 2. He is encouraged to stick to a low-sodium, heart-healthy diet. 3. He is encouraged not to use tobacco or alcohol. 4. He is to follow up with Dr. Antoine Poche or the PA and our office will     call him with an appointment. 5. He is to follow up with Dr. Tawanna Cooler as needed.  DISCHARGE MEDICATIONS: 1. Tylenol 325 mg, 1-2 tablets q.4 h. p.r.n. 2. Celexa 40 mg daily. 3. Lisinopril/HCTZ 1/2 tablet daily. 4. Bystolic 5 mg daily. 5. Hydralazine 25 mg daily. 6. Fenofibrate 145 mg daily. 7. Crestor 20 mg daily. 8. Hydrocodone 7/750 one tablet q.i.d. 9. Indomethacin 50 mg t.i.d. for a total of 7 days. 10.Protonix 40 mg a day. 11.Potassium 10 mEq a day.     Theodore Demark, PA-C   ______________________________ Rollene Rotunda, MD, [facility]    RB/MEDQ  D:  03/29/2010  T:  03/30/2010  Job:  [id number]  cc:   Tinnie Gens A. Tawanna Cooler, MD  Electronically Signed by Theodore Demark PA-C on 04/05/2010 03:53:23 PM Electronically Signed by Rollene Rotunda MD [facility] on 04/22/2010  12:17:11 PM

## 2010-04-27 NOTE — Progress Notes (Signed)
Summary: KNEE PAIN  Phone Note Call from Patient Call back at Home Phone [phone]   Caller: Patient Call For: Roderick Pee MD Summary of Call: PT IS HAVING KNEE PAIN PLEASE ADVISE Initial call taken by: Heron Sabins,  October 29, 2009 12:47 PM  Follow-up for Phone Call        elevation and ice, however, if that does not work then the SMOC>>>>>>>>>>>> see orthopedic consult Follow-up by: Roderick Pee MD,  October 29, 2009 1:16 PM  Additional Follow-up for Phone Call Additional follow up Details #1::        patient does not have insurance and can not go to ortho. [phone] Additional Follow-up by: Kern Reap CMA Duncan Dull),  October 29, 2009 4:26 PM    Additional Follow-up for Phone Call Additional follow up Details #2::    patient aware rx for pain will not be given.   Follow-up by: Kern Reap CMA Duncan Dull),  October 29, 2009 4:32 PM

## 2010-04-27 NOTE — Progress Notes (Signed)
Summary: REQUEST FOR REFILL (Hydrocodone)  Phone Note Call from Patient   Caller: Patient  [phone] Summary of Call: Pt called in to request a refill for med: HYDROCO/APAP 7.5-750TAB..... Pt advised that he was told that the previous refill request was denied because he needed to get any further refills from the Specialist that he is seeing.... Pt adv that he doesn't have a specialist and that he and Dr Tawanna Cooler had an agreement that he could get meds from Dr Tawanna Cooler till he sees a specialist..... Pt is advising that he doesn't have insurance and is applying for disability and will not see a specialist till after his disability is approved.  Pt request that Rx be sent to [facility] - [address]   Initial call taken by: Debbra Riding,  September 07, 2009 11:32 AM  Follow-up for Phone Call        Vicodin ES dispense 50 tablets directions one half to one tab t.i.d. p.r.n. for pain, refills x 2 Follow-up by: Roderick Pee MD,  September 07, 2009 12:58 PM  Additional Follow-up for Phone Call Additional follow up Details #1::        pt needs lisinopril also Additional Follow-up by: Heron Sabins,  September 07, 2009 2:56 PM    Additional Follow-up for Phone Call Additional follow up Details #2::    Please call pt back at [phone] Follow-up by: Lucy Antigua,  September 07, 2009 5:00 PM  Additional Follow-up for Phone Call Additional follow up Details #3:: Details for Additional Follow-up Action Taken: rx ready at [facility] on [address]  Additional Follow-up by: Kern Reap CMA Duncan Dull),  September 07, 2009 5:24 PM

## 2010-04-27 NOTE — Assessment & Plan Note (Signed)
Summary: ?sinus inf/njr   Vital Signs:  Patient Profile:   45 Years Old Male Weight:      255 pounds Temp:     97.9 degrees F BP sitting:   136 / 84  Vitals Entered By: Sindy Guadeloupe RN (June 29, 2007 12:11 PM)                 Chief Complaint:  sinus cong--right jaw hurts--started yesterday.  History of Present Illness: Harold [name] is a 45 year old male, who comes in today for two day history of congestion, sore throat, nonproductive cough.  He said no, fever, chills, nausea, vomiting, or diarrhea.    Current Allergies (reviewed today): ! BENADRYL (DIPHENHYDRAMINE HCL) ! COLCHICINE (COLCHICINE)   Social History:    Reviewed history from 05/01/2007 and no changes required:       Single       Divorced       Former Smoker       Alcohol use-no       Drug use-no       Regular exercise-no    Review of Systems      See HPI   Physical Exam  General:     Well-developed,well-nourished,in no acute distress; alert,appropriate and cooperative throughout examination Head:     Normocephalic and atraumatic without obvious abnormalities. No apparent alopecia or balding. Eyes:     No corneal or conjunctival inflammation noted. EOMI. Perrla. Funduscopic exam benign, without hemorrhages, exudates or papilledema. Vision grossly normal. Ears:     External ear exam shows no significant lesions or deformities.  Otoscopic examination reveals clear canals, tympanic membranes are intact bilaterally without bulging, retraction, inflammation or discharge. Hearing is grossly normal bilaterally. Nose:     External nasal examination shows no deformity or inflammation. Nasal mucosa are pink and moist without lesions or exudates. Mouth:     Oral mucosa and oropharynx without lesions or exudates.  Teeth in good repair. Neck:     No deformities, masses, or tenderness noted. Chest Wall:     No deformities, masses, tenderness or gynecomastia noted. Lungs:     Normal respiratory effort, chest  expands symmetrically. Lungs are clear to auscultation, no crackles or wheezes.    Impression & Recommendations:  Problem # 1:  VIRAL URI (ICD-465.9) Assessment: New  His updated medication list for this problem includes:    Ibuprofen 200 Mg Caps (Ibuprofen) .[name]... As needed    Hycodan 5-1.5 Mg/78ml Syrp (Hydrocodone-homatropine) .[name]... 1 or 2 tsps three times a day as needed   Complete Medication List: 1)  Celexa 20 Mg Tabs (Citalopram hydrobromide) .... Take 1 tablet by mouth once a day 2)  Lisinopril-hydrochlorothiazide 20-25 Mg Tabs (Lisinopril-hydrochlorothiazide) .... Take 1 tablet by mouth once a day 3)  Lortab 10 10-500 Mg Tabs (Hydrocodone-acetaminophen) .... As needed 4)  Cialis 20 Mg Tabs (Tadalafil) .... As needed 5)  Ibuprofen 200 Mg Caps (Ibuprofen) .... As needed 6)  Hydrocodone-acetaminophen 7.5-750 Mg Tabs (Hydrocodone-acetaminophen) .... One q4-6h prn 7)  Allopurinol 300 Mg Tabs (Allopurinol) .... Once daily 8)  Protonix 20 Mg Tbec (Pantoprazole sodium) .... Once daily prn 9)  Hycodan 5-1.5 Mg/35ml Syrp (Hydrocodone-homatropine) .[name].. 1 or 2 tsps three times a day as needed 10)  Amoxicillin 500 Mg Caps (Amoxicillin) .... Take 1 tablet by mouth three times a day   Patient Instructions: 1)  Get plenty of rest, drink lots of clear liquids, and use Tylenol or Ibuprofen for fever and comfort. Return in 7-10 days if  you're not better:sooner if you're feeling worse. 2)  he may take one or 2 teaspoons of the hydrocodone cough syrup 3 or 4 times a day to help with the sore throat, head congestion, cough. 3)  Also, you may use the Magic mouthwash, 1 teaspoon to swish and spit every two to 4 hours as needed for severe sore throat pain.    Prescriptions: AMOXICILLIN 500 MG  CAPS (AMOXICILLIN) Take 1 tablet by mouth three times a day  #30 x 0   Entered and Authorized by:   Roderick Pee MD   Signed by:   Roderick Pee MD on 06/29/2007   Method used:   Print then Give to  Patient   RxID:   [id number] HYCODAN 5-1.5 MG/5ML  SYRP (HYDROCODONE-HOMATROPINE) 1 or 2 tsps three times a day as needed  #8oz x 1   Entered and Authorized by:   Roderick Pee MD   Signed by:   Roderick Pee MD on 06/29/2007   Method used:   Print then Give to Patient   RxID:   [id number]  ]

## 2010-04-27 NOTE — Miscellaneous (Signed)
Summary: vicodin   Medications Added VICODIN ES 7.5-750 MG  TABS (HYDROCODONE-ACETAMINOPHEN) take one tab two times a day as needed for pain       Clinical Lists Changes  Medications: Added new medication of VICODIN ES 7.5-750 MG  TABS (HYDROCODONE-ACETAMINOPHEN) take one tab two times a day as needed for pain

## 2010-04-27 NOTE — Progress Notes (Signed)
Summary: Pt called to check on status of refill of Vicodin  Phone Note Call from Patient Call back at Work Phone [phone]   Caller: Patient Summary of Call: Pt called and said that he is returning call. Pt is checking on status of refill.  Initial call taken by: Lucy Antigua,  July 03, 2009 4:37 PM  Follow-up for Phone Call        spoke with patient and department of insurance could not help but he has hired a Clinical research associate to try and get disablity.  Is the rx okay to fill? Follow-up by: Kern Reap CMA Duncan Dull),  July 03, 2009 4:46 PM  Additional Follow-up for Phone Call Additional follow up Details #1::        okay per dr Santanna Whitford for #60 take one tab bid  no refils.  2 weeks before refill patient must come in and sign a pain contract. Additional Follow-up by: Kern Reap CMA Duncan Dull),  July 03, 2009 4:57 PM    Additional Follow-up for Phone Call Additional follow up Details #2::    patient is aware and rx called in Follow-up by: Kern Reap CMA Duncan Dull),  July 03, 2009 5:03 PM

## 2010-04-27 NOTE — Assessment & Plan Note (Signed)
Summary: med check--form completion//ccm   Vital Signs:  Patient profile:   45 year old male Weight:      237 pounds Temp:     98.3 degrees F oral BP sitting:   160 / 90  (right arm) Cuff size:   regular  Vitals Entered By: Kern Reap CMA Duncan Dull) (Jul 30, 2009 2:41 PM) CC: med check, and forms Is Patient Diabetic? Yes   CC:  med check and and forms.  History of Present Illness: Harold [name] is a 45 year old single male, smoker, who comes in today for follow-up of hyperlipidemia, and pancreatitis.  He was admitted to the hospital in February of 2011 for pancreatitis, secondary to hypertriglyceridemia.  At that time.  His triglyceride level was 2719.  He was discharged and gemfibrozil 600 mg daily and Zocor.  We stopped the Zocor and switched him to Crestor 40 mg daily.  He states he feels well.  No side effects from medication.  He needs follow-up labs today.  He also brings in a number of forms related to disability.  He tired, and attorney to help him.  He states he cannot work because of the chronic pain.  He takes Vicodin one tablet twice daily.  He is unable to go to the pain clinic because he doesn't have insurance. He also takes lisinopril 20 -- 25 daily BP at home 130/80.  He also takes Celexa 20 mg nightly for mild depression.  he also has a skin rash on his chest he would like checked  Allergies: 1)  ! Benadryl (Diphenhydramine Hcl) 2)  ! Colchicine  Past History:  Past medical, surgical, family and social histories (including risk factors) reviewed for relevance to current acute and chronic problems.  Past Medical History: Reviewed history from 03/11/2008 and no changes required. obesity metabolic syndrome, pre DM & LBP Hypertension Depression  Past Surgical History: Reviewed history from 10/24/2006 and no changes required. Tonsillectomy, adenoidectomy carcoid tumor colon  Family History: Reviewed history from 05/01/2007 and no changes required. Family  History Depression Family History Diabetes 1st degree relative Family History High cholesterol Family History Hypertension  Social History: Reviewed history from 05/01/2007 and no changes required. Single Divorced Former Smoker Alcohol use-no Drug use-no Regular exercise-no  Review of Systems      See HPI  Physical Exam  General:  Well-developed,well-nourished,in no acute distress; alert,appropriate and cooperative throughout examination Skin:  skin condition, consistent with a superficial fungal infection in the chest   Impression & Recommendations:  Problem # 1:  HYPERTRIGLYCERIDEMIA (ICD-272.1) Assessment Improved  His updated medication list for this problem includes:    Gemfibrozil 600 Mg Tabs (Gemfibrozil) .[name]... Take one tab two times a day    Crestor 20 Mg Tabs (Rosuvastatin calcium) .[name]... 1 tab @ bedtime  Orders: Venipuncture (16109) TLB-Lipid Panel (80061-LIPID)  Problem # 2:  HYPERTENSION (ICD-401.9) Assessment: Improved  His updated medication list for this problem includes:    Lisinopril-hydrochlorothiazide 20-25 Mg Tabs (Lisinopril-hydrochlorothiazide) .[name]... Take 1 tablet by mouth once a day  Problem # 3:  CANDIDIASIS, SKIN (ICD-112.3) Assessment: New  Complete Medication List: 1)  Celexa 40 Mg Tabs (Citalopram hydrobromide) .... Take 1 tablet by mouth once a day 2)  Lisinopril-hydrochlorothiazide 20-25 Mg Tabs (Lisinopril-hydrochlorothiazide) .... Take 1 tablet by mouth once a day 3)  Cialis 20 Mg Tabs (Tadalafil) .... As needed 4)  Vicodin Es 7.5-750 Mg Tabs (Hydrocodone-acetaminophen) .... Take 1 tablet by mouth four times a day 5)  Gemfibrozil 600 Mg Tabs (Gemfibrozil) .Marland KitchenMarland KitchenMarland Kitchen  Take one tab two times a day 6)  Cetirizine Hcl 10 Mg Tabs (Cetirizine hcl) .... One tab once daily 7)  Crestor 20 Mg Tabs (Rosuvastatin calcium) .[name].. 1 tab @ bedtime  Patient Instructions: 1)  continue current medications.  Return in 3 months for follow-up, sooner if any  problems. 2)  I will call you the report of the lab work Prescriptions: CRESTOR 20 MG TABS (ROSUVASTATIN CALCIUM) 1 tab @ bedtime  #100 x 3   Entered and Authorized by:   Roderick Pee MD   Signed by:   Roderick Pee MD on 07/30/2009   Method used:   Print then Give to Patient   RxID:   [id number] VICODIN ES 7.5-750 MG TABS (HYDROCODONE-ACETAMINOPHEN) Take 1 tablet by mouth four times a day  #60 x 2   Entered and Authorized by:   Roderick Pee MD   Signed by:   Roderick Pee MD on 07/30/2009   Method used:   Print then Give to Patient   RxID:   [id number]

## 2010-04-27 NOTE — Assessment & Plan Note (Signed)
Summary: chipped bone in shoulder/mhf reschedule per Dr Garald Braver   Vital Signs:  Patient Profile:   45 Years Old Male Weight:      255 pounds Temp:     98.3 degrees F oral BP sitting:   110 / 64  (left arm) Cuff size:   large  Vitals Entered By: Kern Reap CMA (April 25, 2008 12:51 PM)                 Chief Complaint:  left shoulder pain.  History of Present Illness: Harold [name] is a 45 year old male, who comes in today for evaluation of shoulder pain.  He states he fell out of his bed on Sunday in the middle of the night.  Went to the emergency room.  X-ray shows a chip in his left shoulder.  Question acute or old.  Patient, states she's having severe pain.  And a rotator cuff injury to his right shoulder, which was repaired by Dr. Madelon Lips.  He's also had lumbar disk injections by [facility]  orthopedics.  However, he owes  both people money, and they will not see him    Current Allergies: ! BENADRYL (DIPHENHYDRAMINE HCL) ! COLCHICINE (COLCHICINE)  Past Medical History:    Reviewed history from 03/11/2008 and no changes required:       obesity       metabolic syndrome, pre DM & LBP       Hypertension       Depression   Social History:    Reviewed history from 05/01/2007 and no changes required:       Single       Divorced       Former Smoker       Alcohol use-no       Drug use-no       Regular exercise-no    Review of Systems      See HPI   Physical Exam  General:     Well-developed,well-nourished,in no acute distress; alert,appropriate and cooperative throughout examination    Impression & Recommendations:  Problem # 1:  SHOULDER PAIN, LEFT (ICD-719.41) Assessment: New  His updated medication list for this problem includes:    Vicodin Es 7.5-750 Mg Tabs (Hydrocodone-acetaminophen) .[name]... Take 1 tablet by mouth four times a day as needed    Indomethacin 50 Mg Caps (Indomethacin) .[name]... Take 1 tablet by mouth two times a day   Complete Medication  List: 1)  Celexa 20 Mg Tabs (Citalopram hydrobromide) .... Take 1 tablet by mouth once a day 2)  Lisinopril-hydrochlorothiazide 20-25 Mg Tabs (Lisinopril-hydrochlorothiazide) .... Take 1 tablet by mouth once a day 3)  Cialis 20 Mg Tabs (Tadalafil) .... As needed 4)  Protonix 20 Mg Tbec (Pantoprazole sodium) .... Once daily prn 5)  Vicodin Es 7.5-750 Mg Tabs (Hydrocodone-acetaminophen) .... Take 1 tablet by mouth four times a day as needed 6)  Amoxicillin 500 Mg Caps (Amoxicillin) .... Once daily 7)  Indomethacin 50 Mg Caps (Indomethacin) .... Take 1 tablet by mouth two times a day   Patient Instructions: 1)  keep ice on that shoulder is much as possible.  Continue to wear the sling.  It may take a pain pill every 4 to 6 hours as needed for pain.  Call the orthopedist to be seen ASAP   Prescriptions: VICODIN ES 7.5-750 MG  TABS (HYDROCODONE-ACETAMINOPHEN) Take 1 tablet by mouth four times a day as needed  #60 x 2   Entered and Authorized by:   Roderick Pee MD  Signed by:   Roderick Pee MD on 04/25/2008   Method used:   Print then Give to Patient   RxID:   [id number]

## 2010-04-27 NOTE — Progress Notes (Signed)
Summary: refill  Phone Note Refill Request Message from:  Fax from Pharmacy on June 18, 2009 2:10 PM  Refills Requested: Medication #1:  CELEXA 40 MG TABS Take 1 tablet by mouth once a day Initial call taken by: Kern Reap CMA Duncan Dull),  June 18, 2009 2:10 PM    Prescriptions: CELEXA 40 MG TABS (CITALOPRAM HYDROBROMIDE) Take 1 tablet by mouth once a day  #30 x 6   Entered by:   Kern Reap CMA (AAMA)   Authorized by:   Roderick Pee MD   Signed by:   Kern Reap CMA (AAMA) on 06/18/2009   Method used:   Electronically to        [facility] [phone]* (retail)       [address]       [place], [place]  [address]       Ph: [phone]       Fax: [phone]   RxID:   [id number]

## 2010-04-27 NOTE — Assessment & Plan Note (Signed)
Summary: 1 week fup//ccm   Vital Signs:  Patient profile:   45 year old male Height:      70 inches Weight:      239 pounds Pulse rate:   69 / minute BP sitting:   150 / 90  (left arm) Cuff size:   large  Vitals Entered By: Kathlene November LPN (March 02, 2010 11:48 AM) CC: followup BP   CC:  followup BP.  History of Present Illness: Harold [name]  is a 45 year old male, who comes in today for follow-up of two problems.  He has underlying hypertension.  He's been noncompliant with his medication.  I gave him samples of bystolic 5 mg daily to add to his lisinopril 20 -- 25 daily.  BP this morning, 150/90 left arm sitting position.  Is also on Crestor 20 mg daily total cholesterol continues to remain markedly elevated and his triglycerides are now over 2000.  We will request a consult from the lipid clinic.  He states he does not follow a good diet eats a lot of fried and fatty foods.  He also admits to drinking alcohol two to 3 shots, but only two or 3 times per week.  Current Medications (verified): 1)  Celexa 40 Mg Tabs (Citalopram Hydrobromide) .... Take 1 Tablet By Mouth Once A Day 2)  Cialis 20 Mg  Tabs (Tadalafil) .... As Needed 3)  Vicodin Es 7.5-750 Mg Tabs (Hydrocodone-Acetaminophen) .... Take 1 Tablet By Mouth Four Times A Day 4)  Gemfibrozil 600 Mg Tabs (Gemfibrozil) .... Take One Tab Two Times A Day 5)  Crestor 20 Mg Tabs (Rosuvastatin Calcium) .... Take One Tab By Mouth At Bedtime 6)  Flexeril 10 Mg Tabs (Cyclobenzaprine Hcl) .... Take One Tab By Mouth Three Times A Day As Needed For For Muscle Spasm 7)  Lisinopril 40 Mg Tabs (Lisinopril) .... Take 1 Tablet By Mouth Every Morning 8)  Hydrochlorothiazide 25 Mg Tabs (Hydrochlorothiazide) .... Take 1 Tablet By Mouth Every Morning 9)  Prednisone 20 Mg Tabs (Prednisone) .... Uad  Allergies (verified): 1)  ! Benadryl (Diphenhydramine Hcl) 2)  ! Colchicine  Past History:  Past medical, surgical, family and social histories  (including risk factors) reviewed for relevance to current acute and chronic problems.  Past Medical History: Reviewed history from 03/11/2008 and no changes required. obesity metabolic syndrome, pre DM & LBP Hypertension Depression  Past Surgical History: Reviewed history from 10/24/2006 and no changes required. Tonsillectomy, adenoidectomy carcoid tumor colon  Family History: Reviewed history from 05/01/2007 and no changes required. Family History Depression Family History Diabetes 1st degree relative Family History High cholesterol Family History Hypertension  Social History: Reviewed history from 05/01/2007 and no changes required. Single Divorced Former Smoker Alcohol use-no Drug use-no Regular exercise-no  Review of Systems      See HPI  Physical Exam  General:  Well-developed,well-nourished,in no acute distress; alert,appropriate and cooperative throughout examination   Impression & Recommendations:  Problem # 1:  HYPERTRIGLYCERIDEMIA (ICD-272.1) Assessment Deteriorated  His updated medication list for this problem includes:    Gemfibrozil 600 Mg Tabs (Gemfibrozil) .[name]... Take one tab two times a day  Orders: Misc. Referral (Misc. Ref)  Problem # 2:  HYPERTENSION (ICD-401.9) Assessment: Improved  The following medications were removed from the medication list:    Lisinopril 40 Mg Tabs (Lisinopril) .[name]... Take 1 tablet by mouth every morning    Hydrochlorothiazide 25 Mg Tabs (Hydrochlorothiazide) .[name]... Take 1 tablet by mouth every morning His updated medication list for  this problem includes:    Zestoretic 20-25 Mg Tabs (Lisinopril-hydrochlorothiazide) .[name]... Take 1 tablet by mouth every morning  Complete Medication List: 1)  Celexa 40 Mg Tabs (Citalopram hydrobromide) .... Take 1 tablet by mouth once a day 2)  Cialis 20 Mg Tabs (Tadalafil) .... As needed 3)  Vicodin Es 7.5-750 Mg Tabs (Hydrocodone-acetaminophen) .... Take 1 tablet by mouth four times a  day 4)  Gemfibrozil 600 Mg Tabs (Gemfibrozil) .... Take one tab two times a day 5)  Crestor 20 Mg Tabs (rosuvastatin Calcium)  .... Take one tab by mouth at bedtime 6)  Flexeril 10 Mg Tabs (Cyclobenzaprine hcl) .... Take one tab by mouth three times a day as needed for for muscle spasm 7)  Prednisone 20 Mg Tabs (Prednisone) .... Uad 8)  Zestoretic 20-25 Mg Tabs (Lisinopril-hydrochlorothiazide) .... Take 1 tablet by mouth every morning  Patient Instructions: 1)  for your blood pressure control continue the lisinopril 20 -- 25 daily, and the bystolic 5 mg daily in the morning.  Check a morning blood pressure daily.  Return in 5 weeks for follow-up. 2)  Really work hard to avoid fried foods and fatty foods, no alcohol, take your Crestor 20 mg daily.  I will also schedule per consult in the lipid clinic to help [place] manage your hypertriglyceridemia Prescriptions: VICODIN ES 7.5-750 MG TABS (HYDROCODONE-ACETAMINOPHEN) Take 1 tablet by mouth four times a day  #100 x 0   Entered and Authorized by:   Roderick Pee MD   Signed by:   Roderick Pee MD on 03/02/2010   Method used:   Print then Give to Patient   RxID:   [id number] ZESTORETIC 20-25 MG TABS (LISINOPRIL-HYDROCHLOROTHIAZIDE) Take 1 tablet by mouth every morning  #100 x 3   Entered and Authorized by:   Roderick Pee MD   Signed by:   Roderick Pee MD on 03/02/2010   Method used:   Electronically to        [facility] (retail)       [address]       [place], [place]  [address]       Ph: [phone]       Fax: [phone]   RxID:   [id number] CELEXA 40 MG TABS (CITALOPRAM HYDROBROMIDE) Take 1 tablet by mouth once a day  #100 x 3   Entered and Authorized by:   Roderick Pee MD   Signed by:   Roderick Pee MD on 03/02/2010   Method used:   Electronically to        [facility] (retail)       [address]       [place], [place]  [address]       Ph:  [phone]       Fax: [phone]   RxID:   [id number]    Orders Added: 1)  Est. Patient Level III [42706] 2)  Misc. Referral [Misc. Ref]

## 2010-04-27 NOTE — Miscellaneous (Signed)
Summary: Controlled Substance Agreement  Controlled Substance Agreement   Imported By: Maryln Gottron 08/04/2009 12:46:47  _____________________________________________________________________  External Attachment:    Type:   Image     Comment:   External Document

## 2010-04-27 NOTE — Assessment & Plan Note (Signed)
Summary: ankle swollen and sore no injury gout?/mhf   Vital Signs:  Patient Profile:   45 Years Old Male Weight:      250 pounds Temp:     98.3 degrees F oral Pulse rate:   74 / minute Pulse rhythm:   regular BP sitting:   130 / 94  (left arm) Cuff size:   large  Pt. in pain?   yes    Location:   ankle    Intensity:   6    Type:       dull  Vitals Entered By: Kern Reap CMA (February 20, 2008 10:14 AM)                  Chief Complaint:  right ankle swollen and pain.  History of Present Illness: Harold [name] is a 45 year old male, who comes in today for evaluation of pain in his right ankle for 3 weeks.  He has a history of underlying gouty arthropathy.  He takes his allopurinol 300 mg daily.  Despite that he states 3 weeks ago.  His right ankle and right great toe began to be sore and painful.  It is not recalling history,.  Other trauma    Current Allergies: ! BENADRYL (DIPHENHYDRAMINE HCL) ! COLCHICINE (COLCHICINE)  Past Medical History:    Reviewed history from 05/01/2007 and no changes required:       obesity       metabolic syndrome, pre DM & LBP       gout   Social History:    Reviewed history from 05/01/2007 and no changes required:       Single       Divorced       Former Smoker       Alcohol use-no       Drug use-no       Regular exercise-no    Review of Systems      See HPI   Physical Exam  General:     Well-developed,well-nourished,in no acute distress; alert,appropriate and cooperative throughout examination Msk:     tender ankle and great toe to palpation.  There both warm and hot Pulses:     R and L carotid,radial,femoral,dorsalis pedis and posterior tibial pulses are full and equal bilaterally Extremities:     No clubbing, cyanosis, edema, or deformity noted with normal full range of motion of all joints.   Neurologic:     No cranial nerve deficits noted. Station and gait are normal. Plantar reflexes are down-going bilaterally. DTRs  are symmetrical throughout. Sensory, motor and coordinative functions appear intact.    Impression & Recommendations:  Problem # 1:  GOUT, UNSPECIFIED (ICD-274.9) Assessment: Deteriorated  His updated medication list for this problem includes:    Ibuprofen 200 Mg Caps (Ibuprofen) .[name]... As needed    Allopurinol 300 Mg Tabs (Allopurinol) ..... Once daily  Orders: Venipuncture (42595) TLB-Uric Acid, Blood (84550-URIC)   Complete Medication List: 1)  Celexa 20 Mg Tabs (Citalopram hydrobromide) .... Take 1 tablet by mouth once a day 2)  Lisinopril-hydrochlorothiazide 20-25 Mg Tabs (Lisinopril-hydrochlorothiazide) .... Take 1 tablet by mouth once a day 3)  Cialis 20 Mg Tabs (Tadalafil) .... As needed 4)  Ibuprofen 200 Mg Caps (Ibuprofen) .... As needed 5)  Allopurinol 300 Mg Tabs (Allopurinol) .... Once daily 6)  Protonix 20 Mg Tbec (Pantoprazole sodium) .... Once daily prn 7)  Vicodin Es 7.5-750 Mg Tabs (Hydrocodone-acetaminophen) .... Take one tab two times a day as needed for pain  8)  Prednisone 20 Mg Tabs (Prednisone) .... Uad   Patient Instructions: 1)  begin prednisone, take one tablet daily for 5 days, then take a half a tablet a day for 5 days.  I will call you next week when I get your lab work back   ]

## 2010-04-27 NOTE — Assessment & Plan Note (Signed)
Summary: follow up from hosp/kidney function/cjr   Vital Signs:  Patient profile:   45 year old male Height:      70 inches Weight:      243 pounds BMI:     34.99 Temp:     99.0 degrees F oral BP sitting:   118 / 82  (left arm) Cuff size:   regular  Vitals Entered By: Kern Reap CMA Duncan Dull) (May 19, 2009 4:19 PM)  History of Present Illness: Harold [name] is is a 45 year old single male, nonsmoker, who comes in today following a bout of pancreatitis.  He was hospitalized on February the ninth.  Discharged on the 11th with acute pancreatitis secondary to alcohol excess.  He stated he been drinking a lot of alcohol over the Super Bowl weekend, and that's what triggered his pancreatitis.  He now is committed to not drinking any more alcohol.  He also has some abnormal labs that need follow-up.  We gave him a triglyceride lowering drug and simvastatin to take but he is not taken them yet.  In the past.  His lipids are normal.  Blood pressure on lisinopril 20 to 25, normal 118/82  Allergies: 1)  ! Benadryl (Diphenhydramine Hcl) 2)  ! Colchicine (Colchicine)  Past History:  Past medical, surgical, family and social histories (including risk factors) reviewed for relevance to current acute and chronic problems.  Past Medical History: Reviewed history from 03/11/2008 and no changes required. obesity metabolic syndrome, pre DM & LBP Hypertension Depression  Past Surgical History: Reviewed history from 10/24/2006 and no changes required. Tonsillectomy, adenoidectomy carcoid tumor colon  Family History: Reviewed history from 05/01/2007 and no changes required. Family History Depression Family History Diabetes 1st degree relative Family History High cholesterol Family History Hypertension  Social History: Reviewed history from 05/01/2007 and no changes required. Single Divorced Former Smoker Alcohol use-no Drug use-no Regular exercise-no  Review of Systems      See  HPI  Physical Exam  General:  Well-developed,well-nourished,in no acute distress; alert,appropriate and cooperative throughout examination Abdomen:  Bowel sounds positive,abdomen soft and non-tender without masses, organomegaly or hernias noted.   Impression & Recommendations:  Problem # 1:  DEPRESSION (ICD-311) Assessment Deteriorated  His updated medication list for this problem includes:    Celexa 20 Mg Tabs (Citalopram hydrobromide) .[name]... Take 1 tablet by mouth once a day  Orders: Venipuncture ([address]109) TLB-Lipid Panel (80061-LIPID) TLB-BMP (Basic Metabolic Panel-BMET) (80048-METABOL) TLB-CBC Platelet - w/Differential (85025-CBCD) TLB-Hepatic/Liver Function Pnl (80076-HEPATIC) TLB-TSH (Thyroid Stimulating Hormone) (84443-TSH)  Problem # 2:  PANCREATITIS, HX OF (ICD-V12.70) Assessment: New  Orders: Venipuncture (60454) TLB-Lipid Panel (80061-LIPID) TLB-BMP (Basic Metabolic Panel-BMET) (80048-METABOL) TLB-CBC Platelet - w/Differential (85025-CBCD) TLB-Hepatic/Liver Function Pnl (80076-HEPATIC) TLB-TSH (Thyroid Stimulating Hormone) (84443-TSH)  Complete Medication List: 1)  Celexa 20 Mg Tabs (Citalopram hydrobromide) .... Take 1 tablet by mouth once a day 2)  Lisinopril-hydrochlorothiazide 20-25 Mg Tabs (Lisinopril-hydrochlorothiazide) .... Take 1 tablet by mouth once a day 3)  Cialis 20 Mg Tabs (Tadalafil) .... As needed 4)  Vicodin Es 7.5-750 Mg Tabs (Hydrocodone-acetaminophen) .... Take 1 tablet by mouth four times a day 5)  Gemfibrozil 600 Mg Tabs (Gemfibrozil) .... Take one tab two times a day 6)  Simvastatin 20 Mg Tabs (Simvastatin) .... Take one tab at bedtime 7)  Cetirizine Hcl 10 Mg Tabs (Cetirizine hcl) .... One tab once daily  Patient Instructions: 1)  increase the Celexa to 40 mg daily.  Take two Tylenol 4 times a day as needed for pain. 2)  Return in one week for follow-up. 3)  In the meantime stay on a fat-free diet, and absolutely no alcohol

## 2010-04-27 NOTE — Assessment & Plan Note (Signed)
Summary: bp check//ccm   Vital Signs:  Patient profile:   45 year old male Weight:      241 pounds Temp:     98.1 degrees F oral BP sitting:   170 / 100  (left arm) Cuff size:   large  Vitals Entered By: Kathrynn Speed CMA (December 04, 2009 12:08 PM) CC: BP check, src Is Patient Diabetic? No   CC:  BP check and src.  History of Present Illness: Harold [name]  is a 45 year old male, who comes in today for evaluation of elevation of his blood pressure.  He spent a lisinopril H. CTZ 2025 with good blood pressure control until recently.  He states is being compliant with his medication.  He went for a disability examination of August 30 at that time.  His BP was 180/94.  He was told to come see [place] for evaluation.  Review of systems otherwise negative  At the end of the discussion.  He then stated he needs pain medicine.  He said surgery on his left shoulder by Dr. Madelon Lips that was successful, although she has some minor discomfort.  There.  His major discomfort.  His right knee and his back.  Both of those areas were not able to be addressed because he has no insurance.  However, he was told at one time by Dr. Algis Greenhouse is that he had degenerative disease and that was the source of his back pain.  He is currently applying for disability  Preventive Screening-Counseling & Management  Alcohol-Tobacco     Smoking Status: current     Packs/Day: 0.25  Current Medications (verified): 1)  Celexa 40 Mg Tabs (Citalopram Hydrobromide) .... Take 1 Tablet By Mouth Once A Day 2)  Lisinopril-Hydrochlorothiazide 20-25 Mg  Tabs (Lisinopril-Hydrochlorothiazide) .... Take 1 Tablet By Mouth Once A Day 3)  Cialis 20 Mg  Tabs (Tadalafil) .... As Needed 4)  Vicodin Es 7.5-750 Mg Tabs (Hydrocodone-Acetaminophen) .... Take 1 Tablet By Mouth Four Times A Day 5)  Gemfibrozil 600 Mg Tabs (Gemfibrozil) .... Take One Tab Two Times A Day 6)  Crestor 40 Mg Tabs (Rosuvastatin Calcium) .... Take One Tab By Mouth At  Bedtime 7)  Flexeril 10 Mg Tabs (Cyclobenzaprine Hcl) .... Take One Tab By Mouth Three Times A Day As Needed For For Muscle Spasm  Allergies (verified): 1)  ! Benadryl (Diphenhydramine Hcl) 2)  ! Colchicine PMH-FH-SH reviewed for relevance  Social History: Smoking Status:  current Packs/Day:  0.25  Review of Systems      See HPI  Physical Exam  General:  Well-developed,well-nourished,in no acute distress; alert,appropriate and cooperative throughout examination Heart:  170/90 right arm sitting position   Impression & Recommendations:  Problem # 1:  HYPERTENSION (ICD-401.9) Assessment Deteriorated  The following medications were removed from the medication list:    Lisinopril-hydrochlorothiazide 20-25 Mg Tabs (Lisinopril-hydrochlorothiazide) .[name]... Take 1 tablet by mouth once a day His updated medication list for this problem includes:    Lisinopril 40 Mg Tabs (Lisinopril) .[name]... Take 1 tablet by mouth every morning    Hydrochlorothiazide 25 Mg Tabs (Hydrochlorothiazide) .[name]... Take 1 tablet by mouth every morning  Orders: Prescription Created Electronically [id number])  Complete Medication List: 1)  Celexa 40 Mg Tabs (Citalopram hydrobromide) .... Take 1 tablet by mouth once a day 2)  Cialis 20 Mg Tabs (Tadalafil) .... As needed 3)  Vicodin Es 7.5-750 Mg Tabs (Hydrocodone-acetaminophen) .... Take 1 tablet by mouth four times a day 4)  Gemfibrozil 600 Mg Tabs (  Gemfibrozil) .... Take one tab two times a day 5)  Crestor 40 Mg Tabs (Rosuvastatin calcium) .... Take one tab by mouth at bedtime 6)  Flexeril 10 Mg Tabs (Cyclobenzaprine hcl) .... Take one tab by mouth three times a day as needed for for muscle spasm 7)  Lisinopril 40 Mg Tabs (Lisinopril) .... Take 1 tablet by mouth every morning 8)  Hydrochlorothiazide 25 Mg Tabs (Hydrochlorothiazide) .... Take 1 tablet by mouth every morning  Patient Instructions: 1)  begin Coreg, 40 mg directions one half tab now and then one half tab  q.a.m. 2)  Increase the lisinopril to 40 mg daily. 3)  Continue the HCTZ 25 mg daily. 4)  Check a morning blood pressure daily and return in one week for follow-up Prescriptions: VICODIN ES 7.5-750 MG TABS (HYDROCODONE-ACETAMINOPHEN) Take 1 tablet by mouth four times a day  #100 x 3   Entered and Authorized by:   Roderick Pee MD   Signed by:   Roderick Pee MD on 12/04/2009   Method used:   Print then Give to Patient   RxID:   [id number] HYDROCHLOROTHIAZIDE 25 MG TABS (HYDROCHLOROTHIAZIDE) Take 1 tablet by mouth every morning  #100 x 3   Entered and Authorized by:   Roderick Pee MD   Signed by:   Roderick Pee MD on 12/04/2009   Method used:   Electronically to        [facility] (retail)       [address]       [place], [place]  [address]       Ph: [phone]       Fax: [phone]   RxID:   [id number] LISINOPRIL 40 MG TABS (LISINOPRIL) Take 1 tablet by mouth every morning  #100 x 3   Entered and Authorized by:   Roderick Pee MD   Signed by:   Roderick Pee MD on 12/04/2009   Method used:   Electronically to        [facility] (retail)       [address]       [place], [place]  [address]       Ph: [phone]       Fax: [phone]   RxID:   [id number]

## 2010-04-27 NOTE — Progress Notes (Signed)
Summary: refill  Phone Note Call from Patient Call back at [facility] Phone [phone]   Caller: pt live Call For: Tawanna Cooler Summary of Call: Hydrocodone 7.5 750 mg Harold [name] last office visit was 4/09 is this okay to fill? Initial call taken by: Roselle Locus,  October 23, 2007 11:20 AM    #50    Appended Document: refill rx called in.
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
## 2010-04-27 NOTE — Progress Notes (Signed)
Summary: crestor increased  Phone Note Outgoing Call   Summary of Call: crestor increased to 40mg  once daily  Initial call taken by: Kern Reap CMA Duncan Dull),  Jul 31, 2009 3:43 PM    New/Updated Medications: CRESTOR 40 MG TABS (ROSUVASTATIN CALCIUM) take one tab by mouth at bedtime Prescriptions: CRESTOR 40 MG TABS (ROSUVASTATIN CALCIUM) take one tab by mouth at bedtime  #90 x 3   Entered by:   Kern Reap CMA (AAMA)   Authorized by:   Roderick Pee MD   Signed by:   Kern Reap CMA (AAMA) on 07/31/2009   Method used:   Electronically to        [facility]  [address] [phone]* (retail)       [address]       [place], [place]  [id number]       Ph: [phone] or [phone]       Fax: [phone]   RxID:   [id number]

## 2010-04-27 NOTE — Assessment & Plan Note (Signed)
Summary: fup//ccm   Vital Signs:  Patient profile:   45 year old male Weight:      244 pounds Temp:     99 degrees F oral Pulse (ortho):   68 / minute Pulse rhythm:   regular Resp:     12 per minute BP sitting:   158 / 90  (right arm) Cuff size:   regular  Vitals Entered By: Gladis Riffle, RN (May 26, 2009 3:34 PM) CC: FU labs, states waiting on lab results on whether he takes simvastatin and genfibrizole Is Patient Diabetic? No   CC:  FU labs and states waiting on lab results on whether he takes simvastatin and genfibrizole.  History of Present Illness: Harold [name] is a 44 year old single male, who comes in today for evaluation of multiple problems.  At his previous visit.  He was seen following a hospital stay for pancreatitis.  That episode was induced by alcohol.  Follow-up lab work shows a nonfasting triglyceride level of 1685.  Blood sugar back to normal.  Liver functions normal.  CBC normal.  Dr. Madelon Lips, fixed his left shoulder.  However, he continues to have back pain.  Because of the back pain.  He is unable to work.  Dr. Madelon Lips will not see him again because he has no insurance and is not able to pay his bill.  He applied for Medicaid was denied because he is not been out of work for over two years.  I asked him to call the department of insurance in [place] and Dr. Madelon Lips to see if there is anything they can do to help him with his back.  In the meantime, will have to cover him with some pain pills.  Preventive Screening-Counseling & Management  Alcohol-Tobacco     Smoking Status: quit     Packs/Day: 1/7  Current Medications (verified): 1)  Celexa 40 Mg Tabs (Citalopram Hydrobromide) .... Take 1 Tablet By Mouth Once A Day 2)  Lisinopril-Hydrochlorothiazide 20-25 Mg  Tabs (Lisinopril-Hydrochlorothiazide) .... Take 1 Tablet By Mouth Once A Day 3)  Cialis 20 Mg  Tabs (Tadalafil) .... As Needed 4)  Vicodin Es 7.5-750 Mg Tabs (Hydrocodone-Acetaminophen) .... Take 1 Tablet  By Mouth Four Times A Day 5)  Gemfibrozil 600 Mg Tabs (Gemfibrozil) .... Take One Tab Two Times A Day 6)  Simvastatin 20 Mg Tabs (Simvastatin) .... Take One Tab At Bedtime 7)  Cetirizine Hcl 10 Mg Tabs (Cetirizine Hcl) .... One Tab Once Daily  Allergies: 1)  ! Benadryl (Diphenhydramine Hcl) 2)  ! Colchicine (Colchicine)  Past History:  Past medical, surgical, family and social histories (including risk factors) reviewed for relevance to current acute and chronic problems.  Past Medical History: Reviewed history from 03/11/2008 and no changes required. obesity metabolic syndrome, pre DM & LBP Hypertension Depression  Past Surgical History: Reviewed history from 10/24/2006 and no changes required. Tonsillectomy, adenoidectomy carcoid tumor colon  Family History: Reviewed history from 05/01/2007 and no changes required. Family History Depression Family History Diabetes 1st degree relative Family History High cholesterol Family History Hypertension  Social History: Reviewed history from 05/01/2007 and no changes required. Single Divorced Former Smoker Alcohol use-no Drug use-no Regular exercise-no  Review of Systems      See HPI  Physical Exam  General:  Well-developed,well-nourished,in no acute distress; alert,appropriate and cooperative throughout examination   Problems:  Medical Problems Added: 1)  Dx of Hypertriglyceridemia  (ICD-272.1)  Impression & Recommendations:  Problem # 1:  PANCREATITIS, HX OF (ICD-V12.70) Assessment  Improved  Problem # 2:  HYPERTRIGLYCERIDEMIA (ICD-272.1) Assessment: Deteriorated  The following medications were removed from the medication list:    Simvastatin 20 Mg Tabs (Simvastatin) .[name]... Take one tab at bedtime His updated medication list for this problem includes:    Gemfibrozil 600 Mg Tabs (Gemfibrozil) .[name]... Take one tab two times a day  Complete Medication List: 1)  Celexa 40 Mg Tabs (Citalopram hydrobromide) ....  Take 1 tablet by mouth once a day 2)  Lisinopril-hydrochlorothiazide 20-25 Mg Tabs (Lisinopril-hydrochlorothiazide) .... Take 1 tablet by mouth once a day 3)  Cialis 20 Mg Tabs (Tadalafil) .... As needed 4)  Vicodin Es 7.5-750 Mg Tabs (Hydrocodone-acetaminophen) .... Take 1 tablet by mouth four times a day 5)  Gemfibrozil 600 Mg Tabs (Gemfibrozil) .... Take one tab two times a day 6)  Cetirizine Hcl 10 Mg Tabs (Cetirizine hcl) .... One tab once daily  Patient Instructions: 1)  continue your good diet.  Remember to stay away from all fried foods, fatty foods, and carbohydrates. 2)  Drain 30 ounces of water daily. 3)  Take the Crestor 20 mg daily, along with the gemfibrozil, 600 mg daily. 4)  Take Vicodin one half tablet 3 times a day as needed for back pain. 5)  call  the [employer]  in [place], [place] tomorrow to see if there is a possibility of enrolling in the state health.  Plan 6)  Please schedule a follow-up appointment in 1 month. Prescriptions: VICODIN ES 7.5-750 MG TABS (HYDROCODONE-ACETAMINOPHEN) Take 1 tablet by mouth four times a day  #50 x 2   Entered and Authorized by:   Roderick Pee MD   Signed by:   Roderick Pee MD on 05/26/2009   Method used:   Print then Give to Patient   RxID:   [id number]

## 2010-04-27 NOTE — Progress Notes (Signed)
Summary: refills  Phone Note Refill Request Message from:  Fax from Pharmacy on September 04, 2009 4:24 PM  Refills Requested: Medication #1:  LISINOPRIL-HYDROCHLOROTHIAZIDE 20-25 MG  TABS Take 1 tablet by mouth once a day Initial call taken by: Kern Reap CMA Duncan Dull),  September 04, 2009 4:24 PM    Prescriptions: LISINOPRIL-HYDROCHLOROTHIAZIDE 20-25 MG  TABS (LISINOPRIL-HYDROCHLOROTHIAZIDE) Take 1 tablet by mouth once a day  #90 x 3   Entered by:   Kern Reap CMA (AAMA)   Authorized by:   Roderick Pee MD   Signed by:   Kern Reap CMA (AAMA) on 09/04/2009   Method used:   Electronically to        [facility] (retail)       [address]       [place], [place]  [address]       Ph: [phone]       Fax: [phone]   RxID:   [id number]

## 2010-04-27 NOTE — Progress Notes (Signed)
Summary: refill  Phone Note Refill Request Message from:  Fax from Pharmacy on September 11, 2009 1:01 PM  Refills Requested: Medication #1:  LISINOPRIL-HYDROCHLOROTHIAZIDE 20-25 MG  TABS Take 1 tablet by mouth once a day Initial call taken by: Kern Reap CMA Duncan Dull),  September 11, 2009 1:01 PM    Prescriptions: LISINOPRIL-HYDROCHLOROTHIAZIDE 20-25 MG  TABS (LISINOPRIL-HYDROCHLOROTHIAZIDE) Take 1 tablet by mouth once a day  #90 x 3   Entered by:   Kern Reap CMA (AAMA)   Authorized by:   Roderick Pee MD   Signed by:   Kern Reap CMA (AAMA) on 09/11/2009   Method used:   Electronically to        [facility] (retail)       [address]       [place], [place]  [address]       Ph: [phone]       Fax: [phone]   RxID:   [id number]

## 2010-04-27 NOTE — Progress Notes (Signed)
Summary: Schedule Colonoscopy   Phone Note Outgoing Call Call back at [facility] Phone [phone]   Call placed by: Harlow Mares CMA Duncan Dull),  Jul 29, 2009 10:48 AM Call placed to: Patient Summary of Call: Left message on patients machine to call back. pt due for colonoscopy Initial call taken by: Harlow Mares CMA Duncan Dull),  Jul 29, 2009 10:48 AM  Follow-up for Phone Call        patient does not have insurance and is waiting on his disability to get approved. I gae him the number to see if he is able to get patient assistance. He will call back when he gets an answer from them. Follow-up by: Harlow Mares CMA Duncan Dull),  Aug 10, 2009 11:33 AM

## 2010-04-27 NOTE — Assessment & Plan Note (Signed)
Summary: 3 day rov/njr  Medications Added LORTAB 10 10-500 MG  TABS (HYDROCODONE-ACETAMINOPHEN) as needed HYDROCODONE-ACETAMINOPHEN 7.5-750 MG  TABS (HYDROCODONE-ACETAMINOPHEN) one q4-6h prn ALLOPURINOL 300 MG  TABS (ALLOPURINOL) once daily PROTONIX 20 MG  TBEC (PANTOPRAZOLE SODIUM) once daily prn        Vital Signs:  Patient Profile:   45 Years Old Male Weight:      245 pounds Temp:     98.3 degrees F BP sitting:   130 / 88  Vitals Entered By: Sindy Guadeloupe RN (May 04, 2007 10:27 AM)                 Chief Complaint:  follow up on right foot--gout.  History of Present Illness:  Harold [name] is a [age]-year-old male, who comes in today for evaluation of gout.  He was seen yesterday with an acute gouty attack.  He was started allopurinol 300 mg a day and colchicine .6.  Every 6 hours.  He only took two doses of colchicine because it gave him diarrhea.  Current Allergies (reviewed today): ! BENADRYL (DIPHENHYDRAMINE HCL) ! COLCHICINE (COLCHICINE)     Review of Systems      See HPI   Physical Exam  General:     Well-developed,well-nourished,in no acute distress; alert,appropriate and cooperative throughout examination Msk:     acute gouty swollen toe    Impression & Recommendations:  Problem # 1:  GOUT, UNSPECIFIED (ICD-274.9) Assessment: Unchanged  His updated medication list for this problem includes:    Ibuprofen 200 Mg Caps (Ibuprofen) .[name]... As needed    Allopurinol 300 Mg Tabs (Allopurinol) ..... Once daily   Complete Medication List: 1)  Celexa 20 Mg Tabs (Citalopram hydrobromide) .... Take 1 tablet by mouth once a day 2)  Lisinopril-hydrochlorothiazide 20-25 Mg Tabs (Lisinopril-hydrochlorothiazide) .... Take 1 tablet by mouth once a day 3)  Lortab 10 10-500 Mg Tabs (Hydrocodone-acetaminophen) .... As needed 4)  Cialis 20 Mg Tabs (Tadalafil) .... As needed 5)  Ibuprofen 200 Mg Caps (Ibuprofen) .... As needed 6)  Hydrocodone-acetaminophen 7.5-750  Mg Tabs (Hydrocodone-acetaminophen) .... One q4-6h prn 7)  Allopurinol 300 Mg Tabs (Allopurinol) .... Once daily 8)  Protonix 20 Mg Tbec (Pantoprazole sodium) .... Once daily prn   Patient Instructions: 1)  begin prednisone 20-mg tablets two a day for 3 days, one a day for 3 days, then half a tablet a day for 3 days and stop.  Return p.r.n.    ]

## 2010-04-27 NOTE — Assessment & Plan Note (Signed)
Summary: fup on cold symptoms/pt still not feeling well/cjr   Vital Signs:  Patient profile:   45 year old male Height:      70 inches Weight:      241 pounds BMI:     34.70 Temp:     98.5 degrees F oral BP sitting:   184 / 100  (left arm) Cuff size:   regular  Vitals Entered By: Kern Reap CMA Duncan Dull) (February 23, 2010 10:23 AM) CC: follow-up visit cough and urine   CC:  follow-up visit cough and urine.  History of Present Illness: Harold [name] is a 45 year old male, who comes in today for evaluation of multiple problems.  He has underlying hypertension.  BP today 184/100 on lisinopril 40 mg daily and hydrochlorothiazide 25 mg daily.  He, states he's been compliant with his medication however, the samples of Corgard, really lowered his blood pressure back to normal.  He is out of samples and did not come back for follow-up.  In the past month.  He said his condition.  Postnasal drip and cough, pain.  He continues also to smoke.  He's been on the Crestor 40 mg daily.  He is to for follow-up lipid panel  Allergies: 1)  ! Benadryl (Diphenhydramine Hcl) 2)  ! Colchicine  Past History:  Past medical, surgical, family and social histories (including risk factors) reviewed for relevance to current acute and chronic problems.  Past Medical History: Reviewed history from 03/11/2008 and no changes required. obesity metabolic syndrome, pre DM & LBP Hypertension Depression  Past Surgical History: Reviewed history from 10/24/2006 and no changes required. Tonsillectomy, adenoidectomy carcoid tumor colon  Family History: Reviewed history from 05/01/2007 and no changes required. Family History Depression Family History Diabetes 1st degree relative Family History High cholesterol Family History Hypertension  Social History: Reviewed history from 05/01/2007 and no changes required. Single Divorced Former Smoker Alcohol use-no Drug use-no Regular exercise-no  Review of  Systems      See HPI  Physical Exam  General:  Well-developed,well-nourished,in no acute distress; alert,appropriate and cooperative throughout examination..........Marland Kitchensmells of smoke Head:  Normocephalic and atraumatic without obvious abnormalities. No apparent alopecia or balding. Eyes:  No corneal or conjunctival inflammation noted. EOMI. Perrla. Funduscopic exam benign, without hemorrhages, exudates or papilledema. Vision grossly normal. Ears:  External ear exam shows no significant lesions or deformities.  Otoscopic examination reveals clear canals, tympanic membranes are intact bilaterally without bulging, retraction, inflammation or discharge. Hearing is grossly normal bilaterally. Nose:  External nasal examination shows no deformity or inflammation. Nasal mucosa are pink and moist without lesions or exudates. Mouth:  Oral mucosa and oropharynx without lesions or exudates.  Teeth in good repair. Neck:  No deformities, masses, or tenderness noted. Chest Wall:  No deformities, masses, tenderness or gynecomastia noted. Lungs:  Normal respiratory effort, chest expands symmetrically. Lungs are clear to auscultation, no crackles or wheezes.   Problems:  Medical Problems Added: 1)  Dx of Rhinitis  (ICD-477.9)  Impression & Recommendations:  Problem # 1:  HYPERTENSION (ICD-401.9) Assessment Deteriorated  His updated medication list for this problem includes:    Lisinopril 40 Mg Tabs (Lisinopril) .[name]... Take 1 tablet by mouth every morning    Hydrochlorothiazide 25 Mg Tabs (Hydrochlorothiazide) .[name]... Take 1 tablet by mouth every morning  Problem # 2:  METABOLIC SYNDROME X (ICD-277.7) Assessment: Deteriorated  Problem # 3:  RHINITIS (ICD-477.9) Assessment: New  Complete Medication List: 1)  Celexa 40 Mg Tabs (Citalopram hydrobromide) .... Take 1 tablet by  mouth once a day 2)  Cialis 20 Mg Tabs (Tadalafil) .... As needed 3)  Vicodin Es 7.5-750 Mg Tabs (Hydrocodone-acetaminophen) ....  Take 1 tablet by mouth four times a day 4)  Gemfibrozil 600 Mg Tabs (Gemfibrozil) .... Take one tab two times a day 5)  Crestor 40 Mg Tabs (Rosuvastatin calcium) .... Take one tab by mouth at bedtime 6)  Flexeril 10 Mg Tabs (Cyclobenzaprine hcl) .... Take one tab by mouth three times a day as needed for for muscle spasm 7)  Lisinopril 40 Mg Tabs (Lisinopril) .... Take 1 tablet by mouth every morning 8)  Hydrochlorothiazide 25 Mg Tabs (Hydrochlorothiazide) .... Take 1 tablet by mouth every morning 9)  Polytrim 10000-0.1 Unit/ml-% Soln (Polymyxin b-trimethoprim) .... 2 drops od qid for 5 days 10)  Prednisone 20 Mg Tabs (Prednisone) .... Uad  Other Orders: Venipuncture (04540) TLB-Lipid Panel (80061-LIPID) TLB-Hepatic/Liver Function Pnl (80076-HEPATIC)  Patient Instructions: 1)  begin prednisone, one tablet x 3 days, a half x 3 days, then half a tablet Monday, Wednesday, Friday, for a two week taper. 2)  Start the beta-blocker, 5 mg one daily. 3)  Return in one week for follow-up. 4)  bring   All your blood pressure readings with you and the device Prescriptions: PREDNISONE 20 MG TABS (PREDNISONE) UAD  #30 x 0   Entered and Authorized by:   Roderick Pee MD   Signed by:   Roderick Pee MD on 02/23/2010   Method used:   Print then Give to Patient   RxID:   [id number]    Orders Added: 1)  Est. Patient Level III [08657] 2)  Venipuncture [84696] 3)  TLB-Lipid Panel [80061-LIPID] 4)  TLB-Hepatic/Liver Function Pnl [80076-HEPATIC]

## 2010-04-27 NOTE — Progress Notes (Signed)
Summary: rx lost  Phone Note Call from Patient   Caller: Patient Call For: Roderick Pee MD Reason for Call: Talk to Doctor Summary of Call: patient is calling because he lost is rx for vicodin.  also the rx for fexeril given to him from dr todd did not have an amount on it.  patient would like to pick up both rx if possible because he is in pain. Initial call taken by: Kern Reap CMA Duncan Dull),  October 20, 2009 3:16 PM  Follow-up for Phone Call        Rx Called In Follow-up by: Kern Reap CMA Duncan Dull),  October 20, 2009 3:48 PM    Prescriptions: FLEXERIL 10 MG TABS (CYCLOBENZAPRINE HCL) take one tab by mouth three times a day as needed for for muscle spasm  #30 x 2   Entered and Authorized by:   Gordy Savers  MD   Signed by:   Gordy Savers  MD on 10/20/2009   Method used:   Print then Give to Patient   RxID:   [id number] VICODIN ES 7.5-750 MG TABS (HYDROCODONE-ACETAMINOPHEN) Take 1 tablet by mouth four times a day  #50 x 0   Entered and Authorized by:   Gordy Savers  MD   Signed by:   Gordy Savers  MD on 10/20/2009   Method used:   Print then Give to Patient   RxID:   [id number]

## 2010-04-27 NOTE — Assessment & Plan Note (Signed)
Summary: 1 week fup//ccm   Vital Signs:  Patient profile:   45 year old male Weight:      238 pounds Temp:     98.5 degrees F oral BP sitting:   180 / 92  (right arm) Cuff size:   regular  Vitals Entered By: Kathrynn Speed CMA (December 11, 2009 2:15 PM) CC: !wk fup, Review report for disability, Mole removal today? Is Patient Diabetic? Yes   CC:  !wk fup, Review report for disability, and Mole removal today?.  History of Present Illness: Harold [name] is a 45 year old male, who comes in today for evaluation of two problems.  He has a history of underlying hypertension.  We recently increased his lisinopril to 40 mg daily.  His 25 mg of HCTZand added Coreg 20 mg daily.  BP at home, now 120 systolic over 80 diastolic.  He also has a skin tag on his forehead.  He would like removed  Current Medications (verified): 1)  Celexa 40 Mg Tabs (Citalopram Hydrobromide) .... Take 1 Tablet By Mouth Once A Day 2)  Cialis 20 Mg  Tabs (Tadalafil) .... As Needed 3)  Vicodin Es 7.5-750 Mg Tabs (Hydrocodone-Acetaminophen) .... Take 1 Tablet By Mouth Four Times A Day 4)  Gemfibrozil 600 Mg Tabs (Gemfibrozil) .... Take One Tab Two Times A Day 5)  Crestor 40 Mg Tabs (Rosuvastatin Calcium) .... Take One Tab By Mouth At Bedtime 6)  Flexeril 10 Mg Tabs (Cyclobenzaprine Hcl) .... Take One Tab By Mouth Three Times A Day As Needed For For Muscle Spasm 7)  Lisinopril 40 Mg Tabs (Lisinopril) .... Take 1 Tablet By Mouth Every Morning 8)  Hydrochlorothiazide 25 Mg Tabs (Hydrochlorothiazide) .... Take 1 Tablet By Mouth Every Morning  Allergies (verified): 1)  ! Benadryl (Diphenhydramine Hcl) 2)  ! Colchicine  Review of Systems      See HPI  Physical Exam  General:  Well-developed,well-nourished,in no acute distress; alert,appropriate and cooperative throughout examination Heart:  170/70 right arm sitting position.  Here, 130/80 at home   Impression & Recommendations:  Problem # 1:  HYPERTENSION  (ICD-401.9) Assessment Improved  His updated medication list for this problem includes:    Lisinopril 40 Mg Tabs (Lisinopril) .[name]... Take 1 tablet by mouth every morning    Hydrochlorothiazide 25 Mg Tabs (Hydrochlorothiazide) .[name]... Take 1 tablet by mouth every morning  Complete Medication List: 1)  Celexa 40 Mg Tabs (Citalopram hydrobromide) .... Take 1 tablet by mouth once a day 2)  Cialis 20 Mg Tabs (Tadalafil) .... As needed 3)  Vicodin Es 7.5-750 Mg Tabs (Hydrocodone-acetaminophen) .... Take 1 tablet by mouth four times a day 4)  Gemfibrozil 600 Mg Tabs (Gemfibrozil) .... Take one tab two times a day 5)  Crestor 40 Mg Tabs (Rosuvastatin calcium) .... Take one tab by mouth at bedtime 6)  Flexeril 10 Mg Tabs (Cyclobenzaprine hcl) .... Take one tab by mouth three times a day as needed for for muscle spasm 7)  Lisinopril 40 Mg Tabs (Lisinopril) .... Take 1 tablet by mouth every morning 8)  Hydrochlorothiazide 25 Mg Tabs (Hydrochlorothiazide) .... Take 1 tablet by mouth every morning  Patient Instructions: 1)  continue the lisinopril 40 mg daily, hydrochlorothiazide, 25 mg daily, and the Coreg , 40 mg one half tab q.a.m.

## 2010-04-27 NOTE — Assessment & Plan Note (Signed)
Summary: ear inf/swollen gland behing ear/okper rv/cjr   Vital Signs:  Patient profile:   45 year old male Temp:     98.8 degrees F oral BP sitting:   144 / 78  (left arm) Cuff size:   large  Vitals Entered By: Sid Falcon LPN (January 29, 2010 2:45 PM)  History of Present Illness: Patient seen for the following.  Questionable left ear infection. He notes some soreness left posterior auricular area. This was noted 2 days ago. Swollen lymph node in this area. History that 4 days prior he accidentally cut his left scalp while shaving. No fever or chills.  Also separate problem one-day history of redness right eye with some greenish discharge and matting this morning. No pain and no blurred vision.  No contact use.  Hypertension and requesting samples of Coreg 20 mg.  Out for past few days.  Pt also complains of 2-3 day hx of mild burning with urination.  No penile discharge.  No fever or chillls.  Allergies: 1)  ! Benadryl (Diphenhydramine Hcl) 2)  ! Colchicine  Past History:  Past Medical History: Last updated: 03/11/2008 obesity metabolic syndrome, pre DM & LBP Hypertension Depression  Past Surgical History: Last updated: 10/24/2006 Tonsillectomy, adenoidectomy carcoid tumor colon  Family History: Last updated: 05/01/2007 Family History Depression Family History Diabetes 1st degree relative Family History High cholesterol Family History Hypertension  Social History: Last updated: 05/01/2007 Single Divorced Former Smoker Alcohol use-no Drug use-no Regular exercise-no  Risk Factors: Exercise: no (05/01/2007)  Risk Factors: Smoking Status: current (12/04/2009) Packs/Day: 0.25 (12/04/2009) PMH-FH-SH reviewed for relevance  Review of Systems  The patient denies anorexia, fever, weight loss, vision loss, chest pain, syncope, dyspnea on exertion, peripheral edema, and headaches.    Physical Exam  General:  Well-developed,well-nourished,in no acute  distress; alert,appropriate and cooperative throughout examination Head:  small abrasion L parietal scalp with no drainage and no surrounding erythema. Eyes:  patient has erythema of both conjunctiva. Pupils equal reactive to light. Corneas appear normal. No preauricular adenopathy Ears:  External ear exam shows no significant lesions or deformities.  Otoscopic examination reveals clear canals, tympanic membranes are intact bilaterally without bulging, retraction, inflammation or discharge. Hearing is grossly normal bilaterally. Mouth:  Oral mucosa and oropharynx without lesions or exudates.  Teeth in good repair. Neck:  patient has minimally enlarged tender left posterior regular noted. Lungs:  Normal respiratory effort, chest expands symmetrically. Lungs are clear to auscultation, no crackles or wheezes. Heart:  Normal rate and regular rhythm. S1 and S2 normal without gallop, murmur, click, rub or other extra sounds. Extremities:  no edema Neurologic:  alert & oriented X3 and cranial nerves II-XII intact.     Impression & Recommendations:  Problem # 1:  CONJUNCTIVITIS (ICD-372.30) Assessment New  His updated medication list for this problem includes:    Polytrim 10000-0.1 Unit/ml-% Soln (Polymyxin b-trimethoprim) .[name]... 2 drops od qid for 5 days  Problem # 2:  CERVICAL LYMPHADENOPATHY (ICD-785.6) Assessment: New suspect reactive from recent abrasion L scalp.  No secondary infection. His updated medication list for this problem includes:    Cipro 500 Mg Tabs (Ciprofloxacin hcl) ..... One tab two times a day for 7 days  Problem # 3:  HYPERTENSION (ICD-401.9)  His updated medication list for this problem includes:    Lisinopril 40 Mg Tabs (Lisinopril) .[name]... Take 1 tablet by mouth every morning    Hydrochlorothiazide 25 Mg Tabs (Hydrochlorothiazide) .[name]... Take 1 tablet by mouth every morning  Problem # 4:  DYSURIA (ICD-788.1) Assessment: New urine cx and cover with Cipro pending cx  results. His updated medication list for this problem includes:    Cipro 500 Mg Tabs (Ciprofloxacin hcl) ..... One tab two times a day for 7 days  Orders: UA Dipstick w/o Micro (manual) (06301) T-Culture, Urine ([address]09-32355)  Complete Medication List: 1)  Celexa 40 Mg Tabs (Citalopram hydrobromide) .... Take 1 tablet by mouth once a day 2)  Cialis 20 Mg Tabs (Tadalafil) .... As needed 3)  Vicodin Es 7.5-750 Mg Tabs (Hydrocodone-acetaminophen) .... Take 1 tablet by mouth four times a day 4)  Gemfibrozil 600 Mg Tabs (Gemfibrozil) .... Take one tab two times a day 5)  Crestor 40 Mg Tabs (Rosuvastatin calcium) .... Take one tab by mouth at bedtime 6)  Flexeril 10 Mg Tabs (Cyclobenzaprine hcl) .... Take one tab by mouth three times a day as needed for for muscle spasm 7)  Lisinopril 40 Mg Tabs (Lisinopril) .... Take 1 tablet by mouth every morning 8)  Hydrochlorothiazide 25 Mg Tabs (Hydrochlorothiazide) .... Take 1 tablet by mouth every morning 9)  Polytrim 10000-0.1 Unit/ml-% Soln (Polymyxin b-trimethoprim) .... 2 drops od qid for 5 days 10)  Cipro 500 Mg Tabs (Ciprofloxacin hcl) .... One tab two times a day for 7 days  Patient Instructions: 1)  Clean any discharge from eyelids with baby shampoo and warm water. Be sure to wash hands often to avoid spreading and reinfection. If you wear contacts, remove them and wear glasses until infection resolved( be sure and clean lenses before replacing).  Prescriptions: CIPRO 500 MG TABS (CIPROFLOXACIN HCL) one tab two times a day for 7 days  #14 x 0   Entered by:   Sid Falcon LPN   Authorized by:   Evelena Peat MD   Signed by:   Sid Falcon LPN on 73/22/0254   Method used:   Electronically to        [facility] (retail)       [address]       [place], [place]  [address]       Ph: [phone]       Fax: [phone]   RxID:   [id number] POLYTRIM 10000-0.1 UNIT/ML-% SOLN (POLYMYXIN B-TRIMETHOPRIM) 2  drops OD QID for 5 days  #5 ml x 0   Entered and Authorized by:   Evelena Peat MD   Signed by:   Evelena Peat MD on 01/29/2010   Method used:   Electronically to        [facility] (retail)       [address]       [place], [place]  [address]       Ph: [phone]       Fax: [phone]   RxID:   [id number]    Orders Added: 1)  UA Dipstick w/o Micro (manual) [81002] 2)  T-Culture, Urine [[id number]] 3)  Est. Patient Level IV [76195]    Laboratory Results   Urine Tests    Routine Urinalysis   Color: yellow Appearance: Hazy Glucose: negative   (Normal Range: Negative) Bilirubin: negative   (Normal Range: Negative) Ketone: negative   (Normal Range: Negative) Spec. Gravity: <1.005   (Normal Range: 1.003-1.035) Blood: moderate   (Normal Range: Negative) pH: 7.0   (Normal Range: 5.0-8.0) Protein: 30   (Normal Range: Negative) Urobilinogen: 0.2   (  Normal Range: 0-1) Nitrite: negative   (Normal Range: Negative) Leukocyte Esterace: trace   (Normal Range: Negative)    Comments: Sid Falcon LPN  January 29, 2010 3:21 PM

## 2010-04-29 NOTE — Assessment & Plan Note (Signed)
Summary: eph. gd  Medications Added CELEXA 20 MG TABS (CITALOPRAM HYDROBROMIDE) take one tablet by mouth daily CRESTOR 10 MG TABS (ROSUVASTATIN CALCIUM) take one tablet by mouth daily COLCRYS 0.6 MG TABS (COLCHICINE) Take 1 tablet by mouth once a day for 3 months      Allergies Added:   History of Present Illness: Primary Cardiologist:  Dr. Rollene Rotunda  Harold [name] is a 45 yo male who was admitted 03/26/10 to 03/29/10 with acute pericarditis.  Echo on 12/30 demonstrated an EF 55-60%; mild LVH; mild-mod circumferential pericardial effusion without evidence of tamponade.  He was treated with NSAIDs with improvement.  He returns for follow up.  He is doing well.  He denies any further pleuritic chest pain.  He has some mild dyspnea.  Notes NYHA class 2 to 2b symptoms.  No syncope.  No cough.  No orthopnea or PND.  He does have witnessed apneic episodes and has a sleep test scheduled soon.  He denies chest pain with exertion.  Current Medications (verified): 1)  Celexa 20 Mg Tabs (Citalopram Hydrobromide) .... Take One Tablet By Mouth Daily 2)  Cialis 20 Mg  Tabs (Tadalafil) .... Uad 3)  Vicodin Es 7.5-750 Mg Tabs (Hydrocodone-Acetaminophen) .... Take 1 Tablet By Mouth Four Times A Day 4)  Crestor 10 Mg Tabs (Rosuvastatin Calcium) .... Take One Tablet By Mouth Daily 5)  Flexeril 10 Mg Tabs (Cyclobenzaprine Hcl) .... Take One Tab By Mouth Three Times A Day As Needed For For Muscle Spasm 6)  Zestoretic 20-25 Mg Tabs (Lisinopril-Hydrochlorothiazide) .... Take 1 Tablet By Mouth Every Morning 7)  Bystolic 5 Mg Tabs (Nebivolol Hcl) .... Take 1 Tablet By Mouth Daily 8)  Fish Oil 1000 Mg Caps (Omega-3 Fatty Acids) .... Take 2 Capsules By Mouth Daily 9)  Tricor 145 Mg Tabs (Fenofibrate) .... One Tab Once Daily 10)  Klor-Con 10 10 Meq Cr-Tabs (Potassium Chloride) .... Take One Tab By Mouth Once Daily 11)  Indomethacin 50 Mg Caps (Indomethacin) .... Take One Tab By Mouth Three Times A  Day  Allergies (verified): 1)  ! Colchicine 2)  Benadryl (Diphenhydramine Hcl)  Past History:  Past Medical History: Last updated: 04/08/2010 Admx 03/26/10-03/29/10: acute pericarditis   a. echo 03/26/10: EF 55-60%; mild LVH; trivial MR; RVF ok; mild-mod circumferential Eff w/o tamponade Hypertriglyceridemia   a. followed in [facility] obesity metabolic syndrome Hypertension Depression/Anxiety GERD h/o Pancreatitis   a. admx 04/2009 . . . ? 2/2 triglycerides chronic back pain  Review of Systems       As per  the HPI.  All other systems reviewed and negative.   Vital Signs:  Patient profile:   45 year old male Height:      70 inches Weight:      238.50 pounds Pulse rate:   87 / minute Pulse rhythm:   regular BP sitting:   130 / 78  (left arm) Cuff size:   large  Physical Exam  General:  Well nourished, well developed, in no acute distress HEENT: normal Neck: no JVD Cardiac:  normal S1, S2; RRR; no murmur; no rubs Lungs:  clear to auscultation bilaterally, no wheezing, rhonchi or rales Abd: soft, nontender, no hepatomegaly Ext: no  edema Skin: warm and dry Neuro:  CNs 2-12 intact, no focal abnormalities noted    EKG  Procedure date:  04/08/2010  Findings:      normal sinus rhythm Heart rate 87 Normal axis T-wave inversions in leads 2, 3, aVF, V3-V6  QTC 599 ms Nonspecific interventricular conduction delay  Impression & Recommendations:  Problem # 1:  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  He is not having any further chest pain.  I think he can stop the indocin.  I will put him on colchicine 0.6 mg once daily for 3 months to prevent recurrence. He is leaving tomorrow for [employer]  He will be set up for a repeat echo when he returns and follow up with Dr. Antoine Poche.  Problem # 2:  NONSPECIFIC ABNORMAL ELECTROCARDIOGRAM (ICD-794.31) QT is prolonged. He  should refrain from starting any other drugs that have the potential to prolong the QT interval. He has diffuse T wave inversions in inf leads and V3-6.  This is c/w resolving pericarditis.  Problem # 3:  HYPERTENSION (ICD-401.9) Controlled.  Problem # 4:  Possible sleep apnea As noted, he has a sleep test pending.  Other Orders: Echocardiogram (Echo)  Patient Instructions: 1)  Your physician has recommended you make the following change in your medication:  2)  Stop the Indomethacin. 3)  Start taking Colchicine (colcrys) once daily for 3 months. 4)  Your physician has requested that you have an echocardiogram.  Echocardiography is a painless test that uses sound waves to create images of your heart. It provides your doctor with information about the size and shape of your heart and how well your heart's chambers and valves are working.  This procedure takes approximately one hour. There are no restrictions for this procedure. 5)  Your physician recommends that you schedule a follow-up appointment in: With Dr. Antoine Poche in 2 to 3 months Prescriptions: COLCRYS 0.6 MG TABS (COLCHICINE) Take 1 tablet by mouth once a day for 3 months  #30 x 2   Entered and Authorized by:   Tereso Newcomer PA-C   Signed by:   Tereso Newcomer PA-C on 04/08/2010   Method used:   Print then Give to Patient   RxID:   [id number]

## 2010-04-29 NOTE — Assessment & Plan Note (Signed)
Summary: 1 month rov/njr   Vital Signs:  Patient profile:   45 year old male Weight:      240 pounds Temp:     98.7 degrees F oral BP sitting:   124 / 84  (left arm) Cuff size:   regular  Vitals Entered By: Kern Reap CMA Duncan Dull) (April 07, 2010 10:44 AM) CC: follow-up visit   CC:  follow-up visit.  History of Present Illness: Harold [name] is a 45 year old male, who comes in today for follow-up of multiple problems.  His lipids, liver tests, were all back to normal because he stopped drinking alcohol completely.  Indeed, his total cholesterol is dropped to 74.  Blood pressure 124/84.  Is compliant with his medication.  He takes Vicodin one tab q.i.d. for chronic back pain.  He scored L. A. for a couple months.    Allergies: 1)  ! Colchicine 2)  Benadryl (Diphenhydramine Hcl)  Past History:  Past medical, surgical, family and social histories (including risk factors) reviewed for relevance to current acute and chronic problems.  Past Medical History: Reviewed history from 03/11/2008 and no changes required. obesity metabolic syndrome, pre DM & LBP Hypertension Depression  Past Surgical History: Reviewed history from 10/24/2006 and no changes required. Tonsillectomy, adenoidectomy carcoid tumor colon  Family History: Reviewed history from 05/01/2007 and no changes required. Family History Depression Family History Diabetes 1st degree relative Family History High cholesterol Family History Hypertension  Social History: Reviewed history from 05/01/2007 and no changes required. Single Divorced Former Smoker Alcohol use-no Drug use-no Regular exercise-no  Review of Systems      See HPI  Physical Exam  General:  Well-developed,well-nourished,in no acute distress; alert,appropriate and cooperative throughout examination   Impression & Recommendations:  Problem # 1:  CIGARETTE SMOKER (ICD-305.1) Assessment Improved  His updated medication list for  this problem includes:    Chantix Continuing Month Pak 1 Mg Tabs (Varenicline tartrate) ..... Uad  Problem # 2:  PANCREATITIS, HX OF (ICD-V12.70) Assessment: Improved  Problem # 3:  METABOLIC SYNDROME X (ICD-277.7) Assessment: Improved  Complete Medication List: 1)  Celexa 40 Mg Tabs (Citalopram hydrobromide) .... Take 1 tablet by mouth once a day 2)  Cialis 20 Mg Tabs (Tadalafil) .... Uad 3)  Vicodin Es 7.5-750 Mg Tabs (Hydrocodone-acetaminophen) .... Take 1 tablet by mouth four times a day 4)  Crestor 20 Mg Tabs (rosuvastatin Calcium)  .... Take one tab by mouth at bedtime 5)  Flexeril 10 Mg Tabs (Cyclobenzaprine hcl) .... Take one tab by mouth three times a day as needed for for muscle spasm 6)  Zestoretic 20-25 Mg Tabs (Lisinopril-hydrochlorothiazide) .... Take 1 tablet by mouth every morning 7)  Bystolic 5 Mg Tabs (Nebivolol hcl) .... Take 1 tablet by mouth daily 8)  Fenofibrate 160 Mg Tabs (Fenofibrate) .... Take one tablet by mouth daily with a meal 9)  Fish Oil 1000 Mg Caps (Omega-3 fatty acids) .... Take 2 capsules by mouth daily 10)  Tricor 145 Mg Tabs (Fenofibrate) .... One tab once daily 11)  Klor-con 10 10 Meq Cr-tabs (Potassium chloride) .... Take one tab by mouth once daily 12)  Pantoprazole Sodium 40 Mg Tbec (Pantoprazole sodium) .... Take one tab by mouth once daily 13)  Indomethacin 50 Mg Caps (Indomethacin) .... Take one tab by mouth three times a day 14)  Chantix Continuing Month Pak 1 Mg Tabs (Varenicline tartrate) .... Uad  Patient Instructions: 1)  decrease the Crestor to 10 mg daily. 2)  Continue your good  health habits. 3)  Workup on stop smoking completely.  Follow-up in 3 months when you get back from [place] 4)  Continue to stayon a  alcohol free diet Prescriptions: VICODIN ES 7.5-750 MG TABS (HYDROCODONE-ACETAMINOPHEN) Take 1 tablet by mouth four times a day  #120 x 3   Entered and Authorized by:   Roderick Pee MD   Signed by:   Roderick Pee MD on  04/07/2010   Method used:   Print then Give to Patient   RxID:   [id number] CIALIS 20 MG  TABS (TADALAFIL) UAD  #30 x 3   Entered and Authorized by:   Roderick Pee MD   Signed by:   Roderick Pee MD on 04/07/2010   Method used:   Print then Give to Patient   RxID:   [id number]    Orders Added: 1)  Est. Patient Level III [62831]

## 2010-04-29 NOTE — Progress Notes (Signed)
Summary: pt req refill VICODIN ES 7.5-750 MG TABS pt req refill  Phone Note Refill Request Call back at Home Phone [phone] Message from:  Patient on March 24, 2010 1:56 PM  Refills Requested: Medication #1:  VICODIN ES 7.5-750 MG TABS Take 1 tablet by mouth four times a day   Dosage confirmed as above?Dosage Confirmed   Supply Requested: 1 month Pls call in to [facility] on [address]. Pt was in hosp on Monday 03/22/10 for GI inflamation. Pt req refill asap.    Method Requested: Telephone to Pharmacy Initial call taken by: Lucy Antigua,  March 24, 2010 1:55 PM  Follow-up for Phone Call        Pt called to check on status of refill. Need this asap today.  Follow-up by: Lucy Antigua,  March 24, 2010 4:03 PM  Additional Follow-up for Phone Call Additional follow up Details #1::        Pt called back to check on status of refill.  Additional Follow-up by: Lucy Antigua,  March 24, 2010 4:52 PM    Additional Follow-up for Phone Call Additional follow up Details #2::    less than one months since last refill.  May refill # 60 until Dr Nelida Meuse return Follow-up by: Evelena Peat MD,  March 24, 2010 6:05 PM  Additional Follow-up for Phone Call Additional follow up Details #3:: Details for Additional Follow-up Action Taken: Pt informed R ready for pick-up Additional Follow-up by: Sid Falcon LPN,  March 25, 2010 8:30 AM  Prescriptions: VICODIN ES 7.5-750 MG TABS (HYDROCODONE-ACETAMINOPHEN) Take 1 tablet by mouth four times a day  #60 x 0   Entered and Authorized by:   Evelena Peat MD   Signed by:   Evelena Peat MD on 03/24/2010   Method used:   Print then Give to Patient   RxID:   [id number]

## 2010-04-29 NOTE — Assessment & Plan Note (Signed)
Summary: FUP FROM HOSP RE: FLUID ON HEART AND HIGH BP/CJR   Vital Signs:  Patient profile:   45 year old male Weight:      238 pounds Temp:     99.0 degrees F oral BP sitting:   130 / 88  (left arm) Cuff size:   regular  Vitals Entered By: Kern Reap CMA Duncan Dull) (April 01, 2010 1:05 PM) CC: follow-up visit   CC:  follow-up visit.  History of Present Illness: Harold [name] Falling is a 45 year old male, who comes in today for follow-up of multiple problems.  Over the Christmas holidays.  He went to the emergency room for a viral gastroenteritis.  Four days later he went back to the emergency room and was admitted for chest pain eval.  He was admitted in December, the 30th and discharged on January the second.  Workup showed a pericardial effusion, most likely viral in etiology.  He was discharged on indomethacin 50 mg 3 times a day.  However, it he's having problems with nosebleeds from his right nostril.  Is also continues to smoke however, he speak 3 since consumption, and a 6 cigarettes per day, but would like to quit completely.  the pharmacist for lipid evaluation and is currently on 20 mg of Crestor daily, along with TriCor hundred and 45 mg daily.  Also, his blood pressure medication was switched and he was asked to take lisinopril 40 mg daily, along with 25 mg of hydrochlorothiazide daily, however, he never got the prescription filled and is currently taking Zestoretic 20 to 25 daily.  BP today 130/88.  The cardiologist.  Also, recommend he have a sleep apnea study, however, he is going to [place] on January the 11th and will not be back until March the third  Allergies: 1)  ! Colchicine 2)  Benadryl (Diphenhydramine Hcl)  Past History:  Past medical, surgical, family and social histories (including risk factors) reviewed for relevance to current acute and chronic problems.  Past Medical History: Reviewed history from 03/11/2008 and no changes required. obesity metabolic syndrome,  pre DM & LBP Hypertension Depression  Past Surgical History: Reviewed history from 10/24/2006 and no changes required. Tonsillectomy, adenoidectomy carcoid tumor colon  Family History: Reviewed history from 05/01/2007 and no changes required. Family History Depression Family History Diabetes 1st degree relative Family History High cholesterol Family History Hypertension  Social History: Reviewed history from 05/01/2007 and no changes required. Single Divorced Former Smoker Alcohol use-no Drug use-no Regular exercise-no  Review of Systems      See HPI  Physical Exam  General:  Well-developed,well-nourished,in no acute distress; alert,appropriate and cooperative throughout examination Head:  Normocephalic and atraumatic without obvious abnormalities. No apparent alopecia or balding. Eyes:  No corneal or conjunctival inflammation noted. EOMI. Perrla. Funduscopic exam benign, without hemorrhages, exudates or papilledema. Vision grossly normal. Ears:  External ear exam shows no significant lesions or deformities.  Otoscopic examination reveals clear canals, tympanic membranes are intact bilaterally without bulging, retraction, inflammation or discharge. Hearing is grossly normal bilaterally. Nose:  clot the septum.  The right nostril........ozzing  Mouth:  Oral mucosa and oropharynx without lesions or exudates.  Teeth in good repair. Neck:  No deformities, masses, or tenderness noted. Chest Wall:  No deformities, masses, tenderness or gynecomastia noted. Lungs:  Normal respiratory effort, chest expands symmetrically. Lungs are clear to auscultation, no crackles or wheezes. Heart:  Normal rate and regular rhythm. S1 and S2 normal without gallop, murmur, click, rub or other extra sounds.  Impression & Recommendations:  Problem # 1:  PERICARDIAL EFFUSION (ICD-423.9) Assessment New  Orders: Tobacco use cessation intermediate 3-10 minutes (53664)  Problem # 2:  CIGARETTE  SMOKER (ICD-305.1) Assessment: Unchanged  His updated medication list for this problem includes:    Chantix Continuing Month Pak 1 Mg Tabs (Varenicline tartrate) ..... Uad  Orders: Tobacco use cessation intermediate 3-10 minutes (40347)  Problem # 3:  HYPERTENSION (ICD-401.9) Assessment: Improved  His updated medication list for this problem includes:    Zestoretic 20-25 Mg Tabs (Lisinopril-hydrochlorothiazide) .[name]... Take 1 tablet by mouth every morning    Bystolic 5 Mg Tabs (Nebivolol hcl) .[name]... Take 1 tablet by mouth daily  Complete Medication List: 1)  Celexa 40 Mg Tabs (Citalopram hydrobromide) .... Take 1 tablet by mouth once a day 2)  Cialis 20 Mg Tabs (Tadalafil) .... As needed 3)  Vicodin Es 7.5-750 Mg Tabs (Hydrocodone-acetaminophen) .... Take 1 tablet by mouth four times a day 4)  Crestor 20 Mg Tabs (rosuvastatin Calcium)  .... Take one tab by mouth at bedtime 5)  Flexeril 10 Mg Tabs (Cyclobenzaprine hcl) .... Take one tab by mouth three times a day as needed for for muscle spasm 6)  Zestoretic 20-25 Mg Tabs (Lisinopril-hydrochlorothiazide) .... Take 1 tablet by mouth every morning 7)  Bystolic 5 Mg Tabs (Nebivolol hcl) .... Take 1 tablet by mouth daily 8)  Fenofibrate 160 Mg Tabs (Fenofibrate) .... Take one tablet by mouth daily with a meal 9)  Fish Oil 1000 Mg Caps (Omega-3 fatty acids) .... Take 2 capsules by mouth daily 10)  Tricor 145 Mg Tabs (Fenofibrate) .... One tab once daily 11)  Klor-con 10 10 Meq Cr-tabs (Potassium chloride) .... Take one tab by mouth once daily 12)  Pantoprazole Sodium 40 Mg Tbec (Pantoprazole sodium) .... Take one tab by mouth once daily 13)  Indomethacin 50 Mg Caps (Indomethacin) .... Take one tab by mouth three times a day 14)  Chantix Continuing Month Pak 1 Mg Tabs (Varenicline tartrate) .... Uad  Other Orders: Pulmonary Referral (Pulmonary)  Patient Instructions: 1)  use Vaseline up that right nostril 3 times daily.x 2 weeks. 2)  Stop  the indomethacin until Monday then restarted.  I taking one tablet twice daily to bottle empty.  If however, the bleeding recurs stop the Indocin completely. 3)  Chantix one half tablet q.a.m................. stop smoking completely 4)  Continue the Zestoretic, 20/25 q.a.m. for blood pressure control. 5)  We will get to set up the second or third week of March for a sleep apnea study Prescriptions: CHANTIX CONTINUING MONTH PAK 1 MG TABS (VARENICLINE TARTRATE) UAD  #1 x 1   Entered and Authorized by:   Roderick Pee MD   Signed by:   Roderick Pee MD on 04/01/2010   Method used:   Print then Give to Patient   RxID:   [id number] CHANTIX CONTINUING MONTH PAK 1 MG TABS (VARENICLINE TARTRATE) UAD  #1 x 1   Entered and Authorized by:   Roderick Pee MD   Signed by:   Roderick Pee MD on 04/01/2010   Method used:   Electronically to        [facility] (retail)       [address]       [place], [place]  [address]       Ph: [phone]       Fax: [phone]   RxID:  [id number]    Orders Added: 1)  Pulmonary Referral [Pulmonary] 2)  Est. Patient Level III [14782] 3)  Tobacco use cessation intermediate 3-10 minutes [99406]

## 2010-04-29 NOTE — Assessment & Plan Note (Signed)
Summary: dx hypertriglycerydemia/mt  Medications Added BYSTOLIC 5 MG TABS (NEBIVOLOL HCL) Take 1 tablet by mouth daily FENOFIBRATE 160 MG TABS (FENOFIBRATE) Take one tablet by mouth daily with a meal FISH OIL 1000 MG CAPS (OMEGA-3 FATTY ACIDS) Take 2 capsules by mouth daily      Allergies Added: BENADRYL (DIPHENHYDRAMINE HCL)  Visit Type:  Initial Consult         This is a 45 year old male who presents for evaluation in Lipid Clinic.  The patient presents with history of hyperlipidemia, hypertension, smoking, obesity, and current cholesterol value of TC- 242, TG-2924, HDL- 33.9 and LDL was unable to be calculated due to elevated TG.  Pt was suppose to be taking Crestor 20mg  and Gemfibrozil 600mg  when lab was drawn but he reports only taking Crestor 10mg  and no gemfibrozil at this time.  Pt did not have a specific reason of why he was not taking medications as prescribed.   He has a history of elevated TG.  His TG were 1285 in February and 923 in May.  He also had pancreatitis earlier this year.            Review of diet showed pt was eating a high fat, high cholesterol diet prior to most recent lab work.  Breakfast would be bacon, eggs and grits.  Lunch and dinner were often fried chicken, chicken wings or fried fish.  He would snakc on popcorn. States he likes fried foods because of the crunch.  After he received his last lab results, he changed his diet.  He now skips breakfast most days.  For lunch he will have a flatbread from [employer].  Dinner consists of backed chicken, [place], brocolli, etc.  He drinks water throughout the day.  He does drink a SIGNIFICANT amount of alcohol during football season.  For 5 days of the week, he will have 1 1/2 pints of gin or vodka while he is watching the games.           Pt is currently not exercising on a regular basis.  He has back and shoulder pain, bone spurs and carpel tunnel in his wrists that keep him from extensive physicial activity.         Pt is a  smoker.  He smokes about 5 cigarettes/day and has smoked for 20 years.  He has tried to quit in the past with evaporated cigarettes and lozenges.  He stated neither one helped.  He is interested in other alternatives.   Lipid Management Provider  Weston Brass, PharmD  Preventive Screening-Counseling & Management  Alcohol-Tobacco     Alcohol drinks/day: >5     Alcohol type: spirits     >5/day in last 3 mos: yes     Alcohol Counseling: to decrease amount and/or frequency of alcohol intake     Smoking Status: current     Smoking Cessation Counseling: yes     Smoke Cessation Stage: contemplative     Packs/Day: 0.25  Caffeine-Diet-Exercise     Does Patient Exercise: no  Medications Prior to Update: 1)  Celexa 40 Mg Tabs (Citalopram Hydrobromide) .... Take 1 Tablet By Mouth Once A Day 2)  Cialis 20 Mg  Tabs (Tadalafil) .... As Needed 3)  Vicodin Es 7.5-750 Mg Tabs (Hydrocodone-Acetaminophen) .... Take 1 Tablet By Mouth Four Times A Day 4)  Gemfibrozil 600 Mg Tabs (Gemfibrozil) .... Take One Tab Two Times A Day 5)  Crestor 20 Mg Tabs (Rosuvastatin Calcium) .... Take One Tab  By Mouth At Bedtime 6)  Flexeril 10 Mg Tabs (Cyclobenzaprine Hcl) .... Take One Tab By Mouth Three Times A Day As Needed For For Muscle Spasm 7)  Prednisone 20 Mg Tabs (Prednisone) .... Uad 8)  Zestoretic 20-25 Mg Tabs (Lisinopril-Hydrochlorothiazide) .... Take 1 Tablet By Mouth Every Morning  Current Medications (verified): 1)  Celexa 40 Mg Tabs (Citalopram Hydrobromide) .... Take 1 Tablet By Mouth Once A Day 2)  Cialis 20 Mg  Tabs (Tadalafil) .... As Needed 3)  Vicodin Es 7.5-750 Mg Tabs (Hydrocodone-Acetaminophen) .... Take 1 Tablet By Mouth Four Times A Day 4)  Crestor 20 Mg Tabs (Rosuvastatin Calcium) .... Take One Tab By Mouth At Bedtime 5)  Flexeril 10 Mg Tabs (Cyclobenzaprine Hcl) .... Take One Tab By Mouth Three Times A Day As Needed For For Muscle Spasm 6)  Prednisone 20 Mg Tabs (Prednisone) .... Uad 7)   Zestoretic 20-25 Mg Tabs (Lisinopril-Hydrochlorothiazide) .... Take 1 Tablet By Mouth Every Morning 8)  Bystolic 5 Mg Tabs (Nebivolol Hcl) .... Take 1 Tablet By Mouth Daily 9)  Fenofibrate 160 Mg Tabs (Fenofibrate) .... Take One Tablet By Mouth Daily With A Meal 10)  Fish Oil 1000 Mg Caps (Omega-3 Fatty Acids) .... Take 2 Capsules By Mouth Daily  Allergies (verified): 1)  ! Colchicine 2)  Benadryl (Diphenhydramine Hcl)  Past History:  Past Medical History: Last updated: 03/11/2008 obesity metabolic syndrome, pre DM & LBP Hypertension Depression  Family History: Last updated: 05/01/2007 Family History Depression Family History Diabetes 1st degree relative Family History High cholesterol Family History Hypertension  Social History: Alcohol drinks/day:  >5 >5/day in last 3 mos:  yes   Vital Signs:  Patient profile:   45 year old male Weight:      240 pounds  Impression & Recommendations:  Problem # 1:  HYPERTRIGLYCERIDEMIA (ICD-272.1) Pt has history of extremely elevated TG.  Likely secondary to alcohol intake and pt not taking medications as prescribed.  Of note, his TG do increase during football season and were lower during the off season.  Counseled pt extensively on alcohol intake and risk of CVD and pancreatitis.  Pt agreed to decrease alcohol to only 1 drink with the football game rather than the entire pint.  Will change pt from gemfibrozil to fenofibrate to decrease risk of myalgias and drug interactions and help with compliance (once-daily dosing rather than two times a day).  Asked pt to increase Crestor back to 20mg  daily.  He has made significant improvements in his diet already.  Encouraged him to continue this and try to exercise as much as possible.  Will f/u in 1 month.    The following medications were removed from the medication list:    Gemfibrozil 600 Mg Tabs (Gemfibrozil) .[name]... Take one tab two times a day His updated medication list for this problem  includes:    Fenofibrate 160 Mg Tabs (Fenofibrate) .[name]... Take one tablet by mouth daily with a meal  Problem # 2:  CIGARETTE SMOKER (ICD-305.1) Pt interested in smoking cessation.  He has tried several nicotine replacement options in past with no success.  He is interested in Chantix.  Will discuss with Dr. Tawanna Cooler.   Patient Instructions: 1)  Start Tricor (fenofibrate) once daily 2)  Start fish oil (omega-3) two tablets daily 3)  Continue all other medications 4)  Decrease alcohol to one drink daily 5)  Decrease fried foods to once a week 6)  Exercise when possible 7)  Lab Appt: 04/05/09 at 10:30-  do not eat before appt 8)  Lipid Clinic Appt: 04/08/09 at 3pm  Prescriptions: FENOFIBRATE 160 MG TABS (FENOFIBRATE) Take one tablet by mouth daily with a meal  #30 x 3   Entered by:   Weston Brass PharmD   Authorized by:   Lenoria Farrier, MD, [facility]   Signed by:   Weston Brass PharmD on 03/08/2010   Method used:   Electronically to        [facility] [phone]* (retail)       [address]       [place], [place]  [id number]       Ph: [phone] or [phone]       Fax: [phone]   RxID:   [id number]

## 2010-06-02 ENCOUNTER — Ambulatory Visit (INDEPENDENT_AMBULATORY_CARE_PROVIDER_SITE_OTHER)
Admission: RE | Admit: 2010-06-02 | Discharge: 2010-06-02 | Disposition: A | Discharge status: Home / Self Care | Payer: Self-pay | Source: Ambulatory Visit | Attending: Family Medicine | Admitting: Family Medicine

## 2010-06-02 ENCOUNTER — Ambulatory Visit: Payer: Self-pay | Admitting: Internal Medicine

## 2010-06-02 ENCOUNTER — Ambulatory Visit (INDEPENDENT_AMBULATORY_CARE_PROVIDER_SITE_OTHER): Payer: Self-pay | Admitting: Family Medicine

## 2010-06-02 VITALS — BP 115/80 | Temp 97.8°F | Ht 70.0 in | Wt 250.0 lb

## 2010-06-02 DIAGNOSIS — I300 Acute nonspecific idiopathic pericarditis: Secondary | ICD-10-CM

## 2010-06-02 DIAGNOSIS — R0609 Other forms of dyspnea: Secondary | ICD-10-CM

## 2010-06-02 DIAGNOSIS — R609 Edema, unspecified: Secondary | ICD-10-CM

## 2010-06-02 DIAGNOSIS — R0600 Dyspnea, unspecified: Secondary | ICD-10-CM

## 2010-06-02 DIAGNOSIS — I10 Essential (primary) hypertension: Secondary | ICD-10-CM

## 2010-06-02 DIAGNOSIS — R0989 Other specified symptoms and signs involving the circulatory and respiratory systems: Secondary | ICD-10-CM

## 2010-06-02 DIAGNOSIS — R319 Hematuria, unspecified: Secondary | ICD-10-CM

## 2010-06-02 LAB — POCT URINALYSIS DIPSTICK
Glucose, UA: NEGATIVE
Ketones, UA: NEGATIVE
Leukocytes, UA: NEGATIVE
Nitrite, UA: NEGATIVE
Spec Grav, UA: 1.015
Urobilinogen, UA: 4
pH, UA: 6

## 2010-06-02 LAB — BASIC METABOLIC PANEL
BUN: 20 mg/dL (ref 6–23)
CO2: 29 mEq/L (ref 19–32)
Calcium: 8.9 mg/dL (ref 8.4–10.5)
Chloride: 102 mEq/L (ref 96–112)
Creatinine, Ser: 1 mg/dL (ref 0.4–1.5)
GFR: 101.43 mL/min (ref >60.00)
Glucose, Bld: 149 mg/dL — ABNORMAL HIGH (ref 70–99)
Potassium: 4.5 mEq/L (ref 3.5–5.1)
Sodium: 140 mEq/L (ref 135–145)

## 2010-06-02 LAB — TSH: TSH: 1.03 u[IU]/mL (ref 0.35–5.50)

## 2010-06-02 LAB — BRAIN NATRIURETIC PEPTIDE: Pro B Natriuretic peptide (BNP): 736.4 pg/mL — ABNORMAL HIGH (ref 0.0–100.0)

## 2010-06-02 MED ORDER — FUROSEMIDE 20 MG PO TABS: 20.0000 mg | ORAL_TABLET | Freq: Every day | ORAL | Status: DC

## 2010-06-02 NOTE — Progress Notes (Signed)
  Subjective:    Patient ID: Harold [name], male    DOB: 11/01/1965, 45 y.o.   MRN: [id number]  HPI  Patient seen with chief complaint of increased edema legs and feet past 2-3 weeks. He relates hospitalization for acute pericarditis back in January. He also had dyspnea past 2-3 weeks and possibly some orthopnea. Dyspnea mostly with exertion. Occasional dry cough. Patient had ECHO recent hospitalization with EF 55-60% with LVH. Remains on lisinopril hydrochlorothiazide. Patient denies any history of CHF. Has been scheduled for sleep study for possible sleep apnea.  No recent chest pain.  He recalls discharged hospital weight around 236 pounds currently 250 pounds. No alcohol since recent admission. Was treated with Indocin, followed by colchicine for his pericarditis.  Off NSAIDS at this time.   Review of Systems  Constitutional: Positive for fatigue. Negative for fever and chills.  Respiratory: Positive for shortness of breath. Negative for cough and wheezing.   Cardiovascular: Positive for leg swelling. Negative for chest pain and palpitations.  Gastrointestinal: Negative for nausea, vomiting, abdominal pain, diarrhea and blood in stool.  Genitourinary: Negative for dysuria.  Skin: Negative for rash.  Neurological: Negative for syncope.  Psychiatric/Behavioral: Negative for confusion.       Objective:   Physical Exam  patient is alert nontoxic in appearance. Blood pressure 115/80  oropharynx is clear  Neck supple no mass Chest clear to auscultation Heart regular rhythm and rate Abdomen is soft and nontender Extremities 2+ pitting edema of feet legs and ankles bilaterally   GU No hernia.  No urethral discharge.  R testicle slightly tender but no mass palpated.    Assessment & Plan:  #1 increased pedal edema- ?etiology.  May have sleep apnea.  Off NSAIDS.  Check labs with BMP and BNP.  CHeck CXR #2 Hypertension-stable. #3 recent acute pericarditis-  Pt has been scheduled for  repeat ECHO.  May need to move up with new symptoms above. #4 hematuria on urine dipstick.  Needs further evaluation.  Urology referral.

## 2010-06-03 ENCOUNTER — Encounter: Payer: Self-pay | Admitting: Family Medicine

## 2010-06-03 NOTE — Progress Notes (Signed)
Quick Note:  Pt informed, he is scheduled with Dr Todd 3/13 ______ 

## 2010-06-03 NOTE — Progress Notes (Signed)
Quick Note:  Pt informed, he is scheduled with Dr Tawanna Cooler 3/13 ______

## 2010-06-04 ENCOUNTER — Institutional Professional Consult (permissible substitution) (INDEPENDENT_AMBULATORY_CARE_PROVIDER_SITE_OTHER): Payer: Self-pay | Admitting: Pulmonary Disease

## 2010-06-04 ENCOUNTER — Encounter: Payer: Self-pay | Admitting: Pulmonary Disease

## 2010-06-04 DIAGNOSIS — G4733 Obstructive sleep apnea (adult) (pediatric): Secondary | ICD-10-CM

## 2010-06-07 LAB — CARDIAC PANEL(CRET KIN+CKTOT+MB+TROPI)
CK, MB: 2 ng/mL (ref 0.3–4.0)
CK, MB: 2.1 ng/mL (ref 0.3–4.0)
Relative Index: 0.6 (ref 0.0–2.5)
Relative Index: 0.6 (ref 0.0–2.5)
Total CK: 360 U/L — ABNORMAL HIGH (ref 7–232)
Total CK: 375 U/L — ABNORMAL HIGH (ref 7–232)
Troponin I: 0.02 ng/mL (ref 0.00–0.06)
Troponin I: 0.03 ng/mL (ref 0.00–0.06)

## 2010-06-07 LAB — CBC
HCT: 35.2 % — ABNORMAL LOW (ref 39.0–52.0)
HCT: 35.8 % — ABNORMAL LOW (ref 39.0–52.0)
HCT: 41.7 % (ref 39.0–52.0)
Hemoglobin: 11.5 g/dL — ABNORMAL LOW (ref 13.0–17.0)
Hemoglobin: 11.5 g/dL — ABNORMAL LOW (ref 13.0–17.0)
Hemoglobin: 14.1 g/dL (ref 13.0–17.0)
MCH: 32.3 pg (ref 26.0–34.0)
MCH: 32.7 pg (ref 26.0–34.0)
MCH: 33.3 pg (ref 26.0–34.0)
MCHC: 32.1 g/dL (ref 30.0–36.0)
MCHC: 32.7 g/dL (ref 30.0–36.0)
MCHC: 33.8 g/dL (ref 30.0–36.0)
MCV: 100 fL (ref 78.0–100.0)
MCV: 100.6 fL — ABNORMAL HIGH (ref 78.0–100.0)
MCV: 98.3 fL (ref 78.0–100.0)
Platelets: 329 10*3/uL (ref 150–400)
Platelets: 355 10*3/uL (ref 150–400)
Platelets: 365 10*3/uL (ref 150–400)
RBC: 3.52 MIL/uL — ABNORMAL LOW (ref 4.22–5.81)
RBC: 3.56 MIL/uL — ABNORMAL LOW (ref 4.22–5.81)
RBC: 4.24 MIL/uL (ref 4.22–5.81)
RDW: 12.3 % (ref 11.5–15.5)
RDW: 12.4 % (ref 11.5–15.5)
RDW: 12.4 % (ref 11.5–15.5)
WBC: 11.4 10*3/uL — ABNORMAL HIGH (ref 4.0–10.5)
WBC: 20.3 10*3/uL — ABNORMAL HIGH (ref 4.0–10.5)
WBC: 9.5 10*3/uL (ref 4.0–10.5)

## 2010-06-07 LAB — HEPATIC FUNCTION PANEL
ALT: 18 U/L (ref 0–53)
AST: 26 U/L (ref 0–37)
Albumin: 4.2 g/dL (ref 3.5–5.2)
Alkaline Phosphatase: 51 U/L (ref 39–117)
Bilirubin, Direct: <0.1 mg/dL (ref 0.0–0.3)
Total Bilirubin: 0.6 mg/dL (ref 0.3–1.2)
Total Protein: 7 g/dL (ref 6.0–8.3)

## 2010-06-07 LAB — COMPREHENSIVE METABOLIC PANEL
ALT: 15 U/L (ref 0–53)
AST: 20 U/L (ref 0–37)
Albumin: 3.6 g/dL (ref 3.5–5.2)
Alkaline Phosphatase: 53 U/L (ref 39–117)
BUN: 11 mg/dL (ref 6–23)
CO2: 25 mEq/L (ref 19–32)
Calcium: 9.2 mg/dL (ref 8.4–10.5)
Chloride: 98 mEq/L (ref 96–112)
Creatinine, Ser: 0.94 mg/dL (ref 0.4–1.5)
GFR calc Af Amer: >60 mL/min (ref >60)
GFR calc non Af Amer: >60 mL/min (ref >60)
Glucose, Bld: 112 mg/dL — ABNORMAL HIGH (ref 70–99)
Potassium: 3.7 mEq/L (ref 3.5–5.1)
Sodium: 134 mEq/L — ABNORMAL LOW (ref 135–145)
Total Bilirubin: 0.5 mg/dL (ref 0.3–1.2)
Total Protein: 7.2 g/dL (ref 6.0–8.3)

## 2010-06-07 LAB — DIFFERENTIAL
Basophils Absolute: 0 10*3/uL (ref 0.0–0.1)
Basophils Absolute: 0 10*3/uL (ref 0.0–0.1)
Basophils Absolute: 0 10*3/uL (ref 0.0–0.1)
Basophils Relative: 0 % (ref 0–1)
Basophils Relative: 0 % (ref 0–1)
Basophils Relative: 0 % (ref 0–1)
Eosinophils Absolute: 0.1 10*3/uL (ref 0.0–0.7)
Eosinophils Absolute: 0.3 10*3/uL (ref 0.0–0.7)
Eosinophils Absolute: 0.3 10*3/uL (ref 0.0–0.7)
Eosinophils Relative: 1 % (ref 0–5)
Eosinophils Relative: 3 % (ref 0–5)
Eosinophils Relative: 3 % (ref 0–5)
Lymphocytes Relative: 14 % (ref 12–46)
Lymphocytes Relative: 19 % (ref 12–46)
Lymphocytes Relative: 9 % — ABNORMAL LOW (ref 12–46)
Lymphs Abs: 1.6 10*3/uL (ref 0.7–4.0)
Lymphs Abs: 1.8 10*3/uL (ref 0.7–4.0)
Lymphs Abs: 1.8 10*3/uL (ref 0.7–4.0)
Monocytes Absolute: 1.3 10*3/uL — ABNORMAL HIGH (ref 0.1–1.0)
Monocytes Absolute: 1.4 10*3/uL — ABNORMAL HIGH (ref 0.1–1.0)
Monocytes Absolute: 2.1 10*3/uL — ABNORMAL HIGH (ref 0.1–1.0)
Monocytes Relative: 10 % (ref 3–12)
Monocytes Relative: 12 % (ref 3–12)
Monocytes Relative: 14 % — ABNORMAL HIGH (ref 3–12)
Neutro Abs: 16.2 10*3/uL — ABNORMAL HIGH (ref 1.7–7.7)
Neutro Abs: 6.1 10*3/uL (ref 1.7–7.7)
Neutro Abs: 8.1 10*3/uL — ABNORMAL HIGH (ref 1.7–7.7)
Neutrophils Relative %: 64 % (ref 43–77)
Neutrophils Relative %: 71 % (ref 43–77)
Neutrophils Relative %: 80 % — ABNORMAL HIGH (ref 43–77)

## 2010-06-07 LAB — BASIC METABOLIC PANEL
BUN: 10 mg/dL (ref 6–23)
BUN: 11 mg/dL (ref 6–23)
BUN: 12 mg/dL (ref 6–23)
CO2: 27 mEq/L (ref 19–32)
CO2: 27 mEq/L (ref 19–32)
CO2: 28 mEq/L (ref 19–32)
Calcium: 9.1 mg/dL (ref 8.4–10.5)
Calcium: 9.4 mg/dL (ref 8.4–10.5)
Calcium: 9.6 mg/dL (ref 8.4–10.5)
Chloride: 102 mEq/L (ref 96–112)
Chloride: 96 mEq/L (ref 96–112)
Chloride: 99 mEq/L (ref 96–112)
Creatinine, Ser: 0.84 mg/dL (ref 0.4–1.5)
Creatinine, Ser: 0.87 mg/dL (ref 0.4–1.5)
Creatinine, Ser: 0.95 mg/dL (ref 0.4–1.5)
GFR calc Af Amer: >60 mL/min (ref >60)
GFR calc Af Amer: >60 mL/min (ref >60)
GFR calc Af Amer: >60 mL/min (ref >60)
GFR calc non Af Amer: >60 mL/min (ref >60)
GFR calc non Af Amer: >60 mL/min (ref >60)
GFR calc non Af Amer: >60 mL/min (ref >60)
Glucose, Bld: 103 mg/dL — ABNORMAL HIGH (ref 70–99)
Glucose, Bld: 110 mg/dL — ABNORMAL HIGH (ref 70–99)
Glucose, Bld: 113 mg/dL — ABNORMAL HIGH (ref 70–99)
Potassium: 3.2 mEq/L — ABNORMAL LOW (ref 3.5–5.1)
Potassium: 4 mEq/L (ref 3.5–5.1)
Potassium: 4 mEq/L (ref 3.5–5.1)
Sodium: 134 mEq/L — ABNORMAL LOW (ref 135–145)
Sodium: 134 mEq/L — ABNORMAL LOW (ref 135–145)
Sodium: 137 mEq/L (ref 135–145)

## 2010-06-07 LAB — URINALYSIS, ROUTINE W REFLEX MICROSCOPIC
Bilirubin Urine: NEGATIVE
Glucose, UA: NEGATIVE mg/dL
Ketones, ur: NEGATIVE mg/dL
Leukocytes, UA: NEGATIVE
Nitrite: NEGATIVE
Protein, ur: NEGATIVE mg/dL
Specific Gravity, Urine: 1.025 (ref 1.005–1.030)
Urobilinogen, UA: 1 mg/dL (ref 0.0–1.0)
pH: 6 (ref 5.0–8.0)

## 2010-06-07 LAB — LIPASE, BLOOD
Lipase: 28 U/L (ref 11–59)
Lipase: 28 U/L (ref 11–59)

## 2010-06-07 LAB — CK TOTAL AND CKMB (NOT AT ARMC)
CK, MB: 2 ng/mL (ref 0.3–4.0)
Relative Index: 0.5 (ref 0.0–2.5)
Total CK: 372 U/L — ABNORMAL HIGH (ref 7–232)

## 2010-06-07 LAB — POCT CARDIAC MARKERS
CKMB, poc: 3.2 ng/mL (ref 1.0–8.0)
Myoglobin, poc: 239 ng/mL (ref 12–200)
Troponin i, poc: <0.05 ng/mL (ref 0.00–0.09)

## 2010-06-07 LAB — URINE MICROSCOPIC-ADD ON

## 2010-06-07 LAB — TSH: TSH: 0.695 u[IU]/mL (ref 0.350–4.500)

## 2010-06-07 LAB — TROPONIN I: Troponin I: 0.01 ng/mL (ref 0.00–0.06)

## 2010-06-08 ENCOUNTER — Ambulatory Visit: Payer: Self-pay | Admitting: Family Medicine

## 2010-06-09 ENCOUNTER — Other Ambulatory Visit (HOSPITAL_COMMUNITY): Payer: Self-pay

## 2010-06-09 ENCOUNTER — Ambulatory Visit: Payer: Self-pay | Admitting: Cardiology

## 2010-06-10 ENCOUNTER — Ambulatory Visit (INDEPENDENT_AMBULATORY_CARE_PROVIDER_SITE_OTHER): Payer: Self-pay | Admitting: Cardiology

## 2010-06-10 ENCOUNTER — Encounter: Payer: Self-pay | Admitting: Cardiology

## 2010-06-10 ENCOUNTER — Inpatient Hospital Stay (HOSPITAL_COMMUNITY)
Admission: AD | Admit: 2010-06-10 | Discharge: 2010-06-17 | DRG: 286 | Disposition: A | Discharge status: Home / Self Care | Payer: Medicaid Other | Source: Ambulatory Visit | Attending: Cardiology | Admitting: Cardiology

## 2010-06-10 ENCOUNTER — Telehealth: Payer: Self-pay | Admitting: Cardiology

## 2010-06-10 ENCOUNTER — Ambulatory Visit: Payer: Self-pay | Admitting: Family Medicine

## 2010-06-10 ENCOUNTER — Ambulatory Visit (HOSPITAL_BASED_OUTPATIENT_CLINIC_OR_DEPARTMENT_OTHER): Payer: Medicaid Other

## 2010-06-10 DIAGNOSIS — I5033 Acute on chronic diastolic (congestive) heart failure: Secondary | ICD-10-CM | POA: Diagnosis present

## 2010-06-10 DIAGNOSIS — I311 Chronic constrictive pericarditis: Principal | ICD-10-CM | POA: Diagnosis present

## 2010-06-10 DIAGNOSIS — E781 Pure hyperglyceridemia: Secondary | ICD-10-CM | POA: Diagnosis present

## 2010-06-10 DIAGNOSIS — I319 Disease of pericardium, unspecified: Secondary | ICD-10-CM | POA: Insufficient documentation

## 2010-06-10 DIAGNOSIS — I059 Rheumatic mitral valve disease, unspecified: Secondary | ICD-10-CM | POA: Insufficient documentation

## 2010-06-10 DIAGNOSIS — R0989 Other specified symptoms and signs involving the circulatory and respiratory systems: Secondary | ICD-10-CM

## 2010-06-10 DIAGNOSIS — M549 Dorsalgia, unspecified: Secondary | ICD-10-CM | POA: Diagnosis present

## 2010-06-10 DIAGNOSIS — G8929 Other chronic pain: Secondary | ICD-10-CM | POA: Diagnosis present

## 2010-06-10 DIAGNOSIS — I10 Essential (primary) hypertension: Secondary | ICD-10-CM | POA: Insufficient documentation

## 2010-06-10 DIAGNOSIS — K219 Gastro-esophageal reflux disease without esophagitis: Secondary | ICD-10-CM | POA: Diagnosis present

## 2010-06-10 DIAGNOSIS — E669 Obesity, unspecified: Secondary | ICD-10-CM | POA: Insufficient documentation

## 2010-06-10 DIAGNOSIS — I509 Heart failure, unspecified: Secondary | ICD-10-CM | POA: Diagnosis present

## 2010-06-10 DIAGNOSIS — K761 Chronic passive congestion of liver: Secondary | ICD-10-CM | POA: Diagnosis present

## 2010-06-10 DIAGNOSIS — F17200 Nicotine dependence, unspecified, uncomplicated: Secondary | ICD-10-CM | POA: Insufficient documentation

## 2010-06-10 DIAGNOSIS — E876 Hypokalemia: Secondary | ICD-10-CM | POA: Diagnosis present

## 2010-06-10 DIAGNOSIS — Z79899 Other long term (current) drug therapy: Secondary | ICD-10-CM

## 2010-06-10 DIAGNOSIS — Z0289 Encounter for other administrative examinations: Secondary | ICD-10-CM

## 2010-06-10 DIAGNOSIS — F341 Dysthymic disorder: Secondary | ICD-10-CM | POA: Diagnosis present

## 2010-06-10 DIAGNOSIS — R609 Edema, unspecified: Secondary | ICD-10-CM

## 2010-06-10 DIAGNOSIS — Z23 Encounter for immunization: Secondary | ICD-10-CM

## 2010-06-10 DIAGNOSIS — G4733 Obstructive sleep apnea (adult) (pediatric): Secondary | ICD-10-CM | POA: Diagnosis present

## 2010-06-10 HISTORY — DX: Edema, unspecified: R60.9

## 2010-06-10 LAB — COMPREHENSIVE METABOLIC PANEL
ALT: 43 U/L (ref 0–53)
AST: 54 U/L — ABNORMAL HIGH (ref 0–37)
Albumin: 2.8 g/dL — ABNORMAL LOW (ref 3.5–5.2)
Alkaline Phosphatase: 123 U/L — ABNORMAL HIGH (ref 39–117)
BUN: 15 mg/dL (ref 6–23)
CO2: 25 mEq/L (ref 19–32)
Calcium: 8.3 mg/dL — ABNORMAL LOW (ref 8.4–10.5)
Chloride: 97 mEq/L (ref 96–112)
Creatinine, Ser: 1.13 mg/dL (ref 0.4–1.5)
GFR calc Af Amer: >60 mL/min (ref >60)
GFR calc non Af Amer: >60 mL/min (ref >60)
Glucose, Bld: 147 mg/dL — ABNORMAL HIGH (ref 70–99)
Potassium: 3.3 mEq/L — ABNORMAL LOW (ref 3.5–5.1)
Sodium: 133 mEq/L — ABNORMAL LOW (ref 135–145)
Total Bilirubin: 3.7 mg/dL — ABNORMAL HIGH (ref 0.3–1.2)
Total Protein: 6.5 g/dL (ref 6.0–8.3)

## 2010-06-10 LAB — PROTIME-INR
INR: 1.36 (ref 0.00–1.49)
Prothrombin Time: 17 seconds — ABNORMAL HIGH (ref 11.6–15.2)

## 2010-06-10 LAB — CBC
HCT: 44.6 % (ref 39.0–52.0)
Hemoglobin: 14.3 g/dL (ref 13.0–17.0)
MCH: 26.8 pg (ref 26.0–34.0)
MCHC: 32.1 g/dL (ref 30.0–36.0)
MCV: 83.7 fL (ref 78.0–100.0)
Platelets: 312 10*3/uL (ref 150–400)
RBC: 5.33 MIL/uL (ref 4.22–5.81)
RDW: 18.3 % — ABNORMAL HIGH (ref 11.5–15.5)
WBC: 6.7 10*3/uL (ref 4.0–10.5)

## 2010-06-10 LAB — TSH: TSH: 0.824 u[IU]/mL (ref 0.350–4.500)

## 2010-06-10 LAB — APTT: aPTT: 30 seconds (ref 24–37)

## 2010-06-10 LAB — BRAIN NATRIURETIC PEPTIDE: Pro B Natriuretic peptide (BNP): 594 pg/mL — ABNORMAL HIGH (ref 0.0–100.0)

## 2010-06-11 LAB — BASIC METABOLIC PANEL
BUN: 16 mg/dL (ref 6–23)
BUN: 16 mg/dL (ref 6–23)
CO2: 30 mEq/L (ref 19–32)
CO2: 29 mEq/L (ref 19–32)
Calcium: 9.1 mg/dL (ref 8.4–10.5)
Calcium: 9 mg/dL (ref 8.4–10.5)
Chloride: 98 mEq/L (ref 96–112)
Chloride: 102 mEq/L (ref 96–112)
Creatinine, Ser: 1.06 mg/dL (ref 0.4–1.5)
Creatinine, Ser: 1.03 mg/dL (ref 0.4–1.5)
GFR calc Af Amer: >60 mL/min (ref >60)
GFR calc Af Amer: >60 mL/min (ref >60)
GFR calc non Af Amer: >60 mL/min (ref >60)
GFR calc non Af Amer: >60 mL/min (ref >60)
Glucose, Bld: 96 mg/dL (ref 70–99)
Glucose, Bld: 167 mg/dL — ABNORMAL HIGH (ref 70–99)
Potassium: 3.1 mEq/L — ABNORMAL LOW (ref 3.5–5.1)
Potassium: 3.7 mEq/L (ref 3.5–5.1)
Sodium: 138 mEq/L (ref 135–145)
Sodium: 139 mEq/L (ref 135–145)

## 2010-06-11 LAB — MAGNESIUM: Magnesium: 2.1 mg/dL (ref 1.5–2.5)

## 2010-06-12 LAB — BASIC METABOLIC PANEL
BUN: 15 mg/dL (ref 6–23)
CO2: 33 mEq/L — ABNORMAL HIGH (ref 19–32)
Calcium: 9.1 mg/dL (ref 8.4–10.5)
Chloride: 99 mEq/L (ref 96–112)
Creatinine, Ser: 1.09 mg/dL (ref 0.4–1.5)
GFR calc Af Amer: >60 mL/min (ref >60)
GFR calc non Af Amer: >60 mL/min (ref >60)
Glucose, Bld: 83 mg/dL (ref 70–99)
Potassium: 3.7 mEq/L (ref 3.5–5.1)
Sodium: 140 mEq/L (ref 135–145)

## 2010-06-13 LAB — BASIC METABOLIC PANEL
BUN: 19 mg/dL (ref 6–23)
CO2: 34 mEq/L — ABNORMAL HIGH (ref 19–32)
Calcium: 9.3 mg/dL (ref 8.4–10.5)
Chloride: 98 mEq/L (ref 96–112)
Creatinine, Ser: 1.19 mg/dL (ref 0.4–1.5)
GFR calc Af Amer: >60 mL/min (ref >60)
GFR calc non Af Amer: >60 mL/min (ref >60)
Glucose, Bld: 85 mg/dL (ref 70–99)
Potassium: 3.8 mEq/L (ref 3.5–5.1)
Sodium: 139 mEq/L (ref 135–145)

## 2010-06-14 ENCOUNTER — Other Ambulatory Visit: Payer: Self-pay

## 2010-06-14 DIAGNOSIS — I509 Heart failure, unspecified: Secondary | ICD-10-CM

## 2010-06-14 LAB — BASIC METABOLIC PANEL
BUN: 19 mg/dL (ref 6–23)
CO2: 31 mEq/L (ref 19–32)
Calcium: 9.1 mg/dL (ref 8.4–10.5)
Chloride: 97 mEq/L (ref 96–112)
Creatinine, Ser: 1.2 mg/dL (ref 0.4–1.5)
GFR calc Af Amer: >60 mL/min (ref >60)
GFR calc non Af Amer: >60 mL/min (ref >60)
Glucose, Bld: 86 mg/dL (ref 70–99)
Potassium: 4 mEq/L (ref 3.5–5.1)
Sodium: 137 mEq/L (ref 135–145)

## 2010-06-14 LAB — PROTIME-INR
INR: 1.26 (ref 0.00–1.49)
Prothrombin Time: 16 seconds — ABNORMAL HIGH (ref 11.6–15.2)

## 2010-06-14 LAB — CBC
HCT: 46.1 % (ref 39.0–52.0)
Hemoglobin: 15.1 g/dL (ref 13.0–17.0)
MCH: 27.8 pg (ref 26.0–34.0)
MCHC: 32.8 g/dL (ref 30.0–36.0)
MCV: 84.7 fL (ref 78.0–100.0)
Platelets: 390 10*3/uL (ref 150–400)
RBC: 5.44 MIL/uL (ref 4.22–5.81)
RDW: 19.2 % — ABNORMAL HIGH (ref 11.5–15.5)
WBC: 7.1 10*3/uL (ref 4.0–10.5)

## 2010-06-15 LAB — POCT I-STAT 3, ART BLOOD GAS (G3+)
Acid-Base Excess: 8 mmol/L — ABNORMAL HIGH (ref 0.0–2.0)
Bicarbonate: 34.4 mEq/L — ABNORMAL HIGH (ref 20.0–24.0)
O2 Saturation: 68 %
TCO2: 36 mmol/L (ref 0–100)
pCO2 arterial: 52.7 mmHg — ABNORMAL HIGH (ref 35.0–45.0)
pH, Arterial: 7.423 (ref 7.350–7.450)
pO2, Arterial: 35 mmHg — CL (ref 80.0–100.0)

## 2010-06-15 LAB — POCT I-STAT 3, VENOUS BLOOD GAS (G3P V)
Acid-Base Excess: 7 mmol/L — ABNORMAL HIGH (ref 0.0–2.0)
Bicarbonate: 33 mEq/L — ABNORMAL HIGH (ref 20.0–24.0)
O2 Saturation: 36 %
TCO2: 34 mmol/L (ref 0–100)
pCO2, Ven: 50.6 mmHg — ABNORMAL HIGH (ref 45.0–50.0)
pH, Ven: 7.422 — ABNORMAL HIGH (ref 7.250–7.300)
pO2, Ven: 21 mmHg — CL (ref 30.0–45.0)

## 2010-06-15 LAB — BASIC METABOLIC PANEL
BUN: 20 mg/dL (ref 6–23)
CO2: 31 mEq/L (ref 19–32)
Calcium: 9 mg/dL (ref 8.4–10.5)
Chloride: 97 mEq/L (ref 96–112)
Creatinine, Ser: 1.32 mg/dL (ref 0.4–1.5)
GFR calc Af Amer: >60 mL/min (ref >60)
GFR calc non Af Amer: 59 mL/min — ABNORMAL LOW (ref >60)
Glucose, Bld: 152 mg/dL — ABNORMAL HIGH (ref 70–99)
Potassium: 3.7 mEq/L (ref 3.5–5.1)
Sodium: 135 mEq/L (ref 135–145)

## 2010-06-15 LAB — CBC
HCT: 43.7 % (ref 39.0–52.0)
Hemoglobin: 13.7 g/dL (ref 13.0–17.0)
MCH: 26.7 pg (ref 26.0–34.0)
MCHC: 31.4 g/dL (ref 30.0–36.0)
MCV: 85 fL (ref 78.0–100.0)
Platelets: 418 10*3/uL — ABNORMAL HIGH (ref 150–400)
RBC: 5.14 MIL/uL (ref 4.22–5.81)
RDW: 19.3 % — ABNORMAL HIGH (ref 11.5–15.5)
WBC: 8.1 10*3/uL (ref 4.0–10.5)

## 2010-06-15 LAB — POCT ACTIVATED CLOTTING TIME: Activated Clotting Time: 140 seconds

## 2010-06-15 NOTE — Progress Notes (Signed)
Summary: pt calling re time/room number for [facility]                Phone Note Call from Patient   Caller: Patient [phone] Reason for Call: Talk to Nurse Summary of Call: pt calling to get [facility] number and time to go to [facility] today Initial call taken by: Glynda Jaeger,  June 10, 2010 3:30 PM  Follow-up for Phone Call        [facility] has not assigned a bed to pt as of yet  3:34 pm  Avie Arenas, RN  Pt aware bed assignment is not available as of yet.  I gave Patient Placement pt's cell and home number.  They are aware pt is not in the office at this time and they are to call pt.  Pt aware orders are here for him to pick up before he goes to the hospital. Follow-up by: Charolotte Capuchin, RN,  June 10, 2010 3:51 PM

## 2010-06-15 NOTE — Assessment & Plan Note (Signed)
Summary: 2 month rov.sl/sp      Allergies Added:   Visit Type:  Hospital Admission Note Primary Provider:  Roderick Pee MD  CC:  Anasarca.  History of Present Illness: The patient presents for followup of previous cardial effusions. He recently was seen for evaluation of sleep apnea as well. A chest x-ray demonstrated a right pleural effusion. The day he has obvious massive volume overload with edema extending above his abdomen. He has gained about 30 pounds since an appointment earlier this year. He says this has been having for several weeks. It started after he took a trip to [place]. He is having some dyspnea with exertion but is not describing PND or orthopnea. He said he would not be able to walk more than 25 yards without dyspnea.  He is not describing chest pressure, neck or arm discomfort. He is not describing palpitations, presyncope or syncope.  Current Medications (verified): 1)  Celexa 20 Mg Tabs (Citalopram Hydrobromide) .... Take One Tablet By Mouth Daily 2)  Cialis 20 Mg  Tabs (Tadalafil) .... Uad 3)  Crestor 10 Mg Tabs (Rosuvastatin Calcium) .... Take One Tablet By Mouth Daily 4)  Zestoretic 20-25 Mg Tabs (Lisinopril-Hydrochlorothiazide) .... Take 1 Tablet By Mouth Every Morning 5)  Bystolic 5 Mg Tabs (Nebivolol Hcl) .... Take 1 Tablet By Mouth Daily 6)  Tricor 145 Mg Tabs (Fenofibrate) .... One Tab Once Daily 7)  Furosemide 20 Mg Tabs (Furosemide) .... Take 1 Tablet By Mouth Once A Day  Allergies (verified): 1)  ! Colchicine 2)  Benadryl (Diphenhydramine Hcl)  Past History:  Past Medical History: Admx 03/26/10-03/29/10: acute pericarditis   a. echo 03/26/10: EF 55-60%; mild LVH; trivial MR; RVF ok; mild-mod circumferential Eff w/o tamponade Hypertriglyceridemia   a. followed in [facility] lipid clinic Obesity Metabolic syndrome Hypertension Depression/Anxiety GERD h/o Pancreatitis   a. admx 04/2009 . . . ? 2/2 triglycerides Chronic back pain  Past Surgical  History: Tonsillectomy, adenoidectomy Carcoid tumor colon  Family History: History Depression History Diabetes 1st degree relative History High cholesterol History Hypertension Father has a h/o pancreatitis  Social History: Reviewed history from 04/08/2010 and no changes required. Lives in [place] with fiance Smoker Drinks alcohol several times a week and mostly on the weekends (He denies current use)  Review of Systems       As stated in the HPI and negative for all other systems.   Vital Signs:  Patient profile:   45 year old male Height:      70 inches Weight:      261 pounds BMI:     37.58 Pulse rate:   69 / minute Resp:     18 per minute BP sitting:   142 / 120  (right arm)  Vitals Entered By: Marrion Coy, CNA (June 10, 2010 11:20 AM)  Physical Exam  General:  Well developed, well nourished, in no acute distress. Head:  normocephalic and atraumatic Eyes:  PERRLA/EOM intact; conjunctiva and lids normal. Mouth:  Teeth, gums and palate normal. Oral mucosa normal. Neck:  Neck supple, no JVD. No masses, thyromegaly or abnormal cervical nodes. Chest Wall:  no deformities or breast masses noted Lungs:  Clear bilaterally to auscultation and percussion. Abdomen:  Bowel sounds positive; abdomen soft and non-tender without masses, organomegaly, or hernias noted. No hepatosplenomegaly. obese with positive edema Msk:  Back normal, normal gait. Muscle strength and tone normal. Extremities:  severe bilateral lower extremity edema extending to the abdomen Neurologic:  Alert and oriented x  3. Skin:  Intact without lesions or rashes. Cervical Nodes:  no significant adenopathy Inguinal Nodes:  no significant adenopathy Psych:  Normal affect.   Detailed Cardiovascular Exam  Neck    Carotids: Carotids full and equal bilaterally without bruits.      Neck Veins: Mild venous distention  Heart    Inspection: no deformities or lifts noted.      Palpation: normal PMI with no  thrills palpable.      Auscultation: regular rate and rhythm, S1, S2 without murmurs, rubs, gallops, or clicks.    Vascular    Abdominal Aorta: no palpable masses, pulsations, or audible bruits.      Femoral Pulses: normal femoral pulses bilaterally.      Pedal Pulses: Unable to appreciate dorsalis pedis or posterior tibialis    Radial Pulses: normal radial pulses bilaterally.     Impression & Recommendations:  Problem # 1:  EDEMA (ICD-782.3) The patient has severe edema/anasarca. I have reviewed his echocardiogram personally and with Dr. Tenny Craw. Given his past history and current thickening of the pericardium constrictive physiology needs to be considered. I will begin diuresis but he will need a right and left heart catheter.  Problem # 2:  OBSTRUCTIVE SLEEP APNEA (ICD-327.23) This evaluation is ongoing. I will defer management to Dr.Clance  Problem # 3:  PERICARDIAL EFFUSION (ICD-423.9) He has no significant recurrent effusion or tenderness no. However, constrictive pericarditis is now a possibility and will be evaluated as above.  Problem # 4:  HYPERTENSION (ICD-401.9) I will continue his ACE inhibitor and beta blocker.

## 2010-06-15 NOTE — Procedures (Signed)
  NAME:  Harold [name], VENN NO.:  [id number]  MEDICAL RECORD NO.:  [id number]           PATIENT TYPE:  I  LOCATION:  2029                         FACILITY:  [facility]  PHYSICIAN:  Verne Carrow, MDDATE OF BIRTH:  Nov 08, 1965  DATE OF PROCEDURE:  06/14/2010 DATE OF DISCHARGE:                           CARDIAC CATHETERIZATION   PRIMARY CARDIOLOGIST:  Rollene Rotunda, MD, [facility]  PRIMARY CARE PHYSICIAN:  Eugenio Hoes. Tawanna Cooler, MD  PROCEDURES PERFORMED: 1. Left heart catheterization with measurement of the left ventricular     pressures only. 2. Right heart catheterization.  OPERATOR:  Verne Carrow, MD  INDICATIONS:  This is a 45 year old African American male admitted with diastolic heart failure.  There is the possibility of constrictive pericarditis.  The right and left heart catheterization was arranged today to measure pressures and to exclude constriction.  PROCEDURE IN DETAIL:  The patient was brought to the main cardiac catheterization laboratory after signing informed consent for the procedure.  The right groin was prepped and draped in sterile fashion. Lidocaine 1% was used for local anesthesia.  A 5-French sheath was inserted into the right femoral artery without difficulty.  A 7-French sheath was inserted into the right femoral vein without difficulty. Right heart catheterization was performed with a balloon-tipped catheter.  A pigtail catheter was placed into the left ventricle. Simultaneous pressures were measured comparing the left ventricle to the wedge pressure, pulmonary artery pressure, right ventricular pressure, and right atrial pressure.  The patient tolerated the procedure well. He was taken to the recovery area in stable condition.  HEMODYNAMIC FINDINGS:  Central aortic pressure 109/77.  Left ventricular pressure 104/26/30.  Right atrial pressure 26.  Right ventricular pressure 42/20/28.  Pulmonary artery pressure 46/29 with a mean of  35. Pulmonary capillary wedge pressure 29/27 with a mean of 27.  Cardiac output 4.79 L/min.  Cardiac index 2.14 L/min/m2.  Pulmonary artery saturation 36%.  Central aortic saturation 68% (of note, this was a true saturation.  This correlated with the patient's episodes of sleep apnea and peripheral saturation).  Of note, all hemodynamic tracings are included in the chart.  IMPRESSION: 1. Hemodynamics consistent with constriction. 2. Obstructive sleep apnea as demonstrated during periods of apnea     while on the catheterization table at which time the patient     dropped the saturations down into the low 60s.  RECOMMENDATIONS:  We will continue diuresis at this time.  Further plans per Dr. Antoine Poche in regards to the patient's possible constriction physiology.  The patient will also need a sleep study to document sleep apnea and will best be treated with CPAP at night.     Verne Carrow, MD     CM/MEDQ  D:  06/14/2010  T:  06/15/2010  Job:  [id number]  cc:   Tinnie Gens A. Tawanna Cooler, MD  Electronically Signed by Verne Carrow MD on 06/15/2010 10:09:30 AM

## 2010-06-16 LAB — RAPID URINE DRUG SCREEN, HOSP PERFORMED
Amphetamines: NOT DETECTED
Barbiturates: NOT DETECTED
Benzodiazepines: POSITIVE — AB
Cocaine: NOT DETECTED
Opiates: POSITIVE — AB
Tetrahydrocannabinol: NOT DETECTED

## 2010-06-16 LAB — DIFFERENTIAL
Basophils Absolute: 0 10*3/uL (ref 0.0–0.1)
Basophils Absolute: 0 10*3/uL (ref 0.0–0.1)
Basophils Relative: 0 % (ref 0–1)
Basophils Relative: 0 % (ref 0–1)
Eosinophils Absolute: 0 10*3/uL (ref 0.0–0.7)
Eosinophils Absolute: 0.1 10*3/uL (ref 0.0–0.7)
Eosinophils Relative: 0 % (ref 0–5)
Eosinophils Relative: 1 % (ref 0–5)
Lymphocytes Relative: 12 % (ref 12–46)
Lymphocytes Relative: 9 % — ABNORMAL LOW (ref 12–46)
Lymphs Abs: 1.4 10*3/uL (ref 0.7–4.0)
Lymphs Abs: 1.9 10*3/uL (ref 0.7–4.0)
Monocytes Absolute: 0.6 10*3/uL (ref 0.1–1.0)
Monocytes Absolute: 1 10*3/uL (ref 0.1–1.0)
Monocytes Relative: 4 % (ref 3–12)
Monocytes Relative: 6 % (ref 3–12)
Neutro Abs: 13 10*3/uL — ABNORMAL HIGH (ref 1.7–7.7)
Neutro Abs: 13.6 10*3/uL — ABNORMAL HIGH (ref 1.7–7.7)
Neutrophils Relative %: 83 % — ABNORMAL HIGH (ref 43–77)
Neutrophils Relative %: 85 % — ABNORMAL HIGH (ref 43–77)

## 2010-06-16 LAB — COMPREHENSIVE METABOLIC PANEL
ALT: 14 U/L (ref 0–53)
ALT: 20 U/L (ref 0–53)
ALT: 39 U/L (ref 0–53)
AST: 24 U/L (ref 0–37)
AST: 29 U/L (ref 0–37)
AST: 53 U/L — ABNORMAL HIGH (ref 0–37)
Albumin: 2.9 g/dL — ABNORMAL LOW (ref 3.5–5.2)
Albumin: 3.4 g/dL — ABNORMAL LOW (ref 3.5–5.2)
Albumin: 4.6 g/dL (ref 3.5–5.2)
Alkaline Phosphatase: 54 U/L (ref 39–117)
Alkaline Phosphatase: 56 U/L (ref 39–117)
Alkaline Phosphatase: 77 U/L (ref 39–117)
BUN: 2 mg/dL — ABNORMAL LOW (ref 6–23)
BUN: 3 mg/dL — ABNORMAL LOW (ref 6–23)
BUN: 4 mg/dL — ABNORMAL LOW (ref 6–23)
CO2: 27 mEq/L (ref 19–32)
CO2: 29 mEq/L (ref 19–32)
CO2: 29 mEq/L (ref 19–32)
Calcium: 8.7 mg/dL (ref 8.4–10.5)
Calcium: 8.9 mg/dL (ref 8.4–10.5)
Calcium: 9.5 mg/dL (ref 8.4–10.5)
Chloride: 89 mEq/L — ABNORMAL LOW (ref 96–112)
Chloride: 90 mEq/L — ABNORMAL LOW (ref 96–112)
Chloride: 99 mEq/L (ref 96–112)
Creatinine, Ser: 0.83 mg/dL (ref 0.4–1.5)
Creatinine, Ser: 0.87 mg/dL (ref 0.4–1.5)
Creatinine, Ser: 0.9 mg/dL (ref 0.4–1.5)
GFR calc Af Amer: >60 mL/min (ref >60)
GFR calc Af Amer: >60 mL/min (ref >60)
GFR calc Af Amer: >60 mL/min (ref >60)
GFR calc non Af Amer: >60 mL/min (ref >60)
GFR calc non Af Amer: >60 mL/min (ref >60)
GFR calc non Af Amer: >60 mL/min (ref >60)
Glucose, Bld: 115 mg/dL — ABNORMAL HIGH (ref 70–99)
Glucose, Bld: 142 mg/dL — ABNORMAL HIGH (ref 70–99)
Glucose, Bld: 149 mg/dL — ABNORMAL HIGH (ref 70–99)
Potassium: 3 mEq/L — ABNORMAL LOW (ref 3.5–5.1)
Potassium: 3.4 mEq/L — ABNORMAL LOW (ref 3.5–5.1)
Potassium: 3.9 mEq/L (ref 3.5–5.1)
Sodium: 129 mEq/L — ABNORMAL LOW (ref 135–145)
Sodium: 131 mEq/L — ABNORMAL LOW (ref 135–145)
Sodium: 133 mEq/L — ABNORMAL LOW (ref 135–145)
Total Bilirubin: 0.7 mg/dL (ref 0.3–1.2)
Total Bilirubin: 1.4 mg/dL — ABNORMAL HIGH (ref 0.3–1.2)
Total Bilirubin: 2.7 mg/dL — ABNORMAL HIGH (ref 0.3–1.2)
Total Protein: 6.5 g/dL (ref 6.0–8.3)
Total Protein: 6.7 g/dL (ref 6.0–8.3)
Total Protein: 8.5 g/dL — ABNORMAL HIGH (ref 6.0–8.3)

## 2010-06-16 LAB — URINALYSIS, MICROSCOPIC ONLY
Bilirubin Urine: NEGATIVE
Glucose, UA: NEGATIVE mg/dL
Ketones, ur: 15 mg/dL — AB
Leukocytes, UA: NEGATIVE
Nitrite: NEGATIVE
Protein, ur: 30 mg/dL — AB
Specific Gravity, Urine: 1.017 (ref 1.005–1.030)
Urobilinogen, UA: 0.2 mg/dL (ref 0.0–1.0)
pH: 7.5 (ref 5.0–8.0)

## 2010-06-16 LAB — FECAL LACTOFERRIN, QUANT: Fecal Lactoferrin: NEGATIVE

## 2010-06-16 LAB — LIPID PANEL
Cholesterol: 525 mg/dL — ABNORMAL HIGH (ref 0–200)
LDL Cholesterol: UNDETERMINED mg/dL (ref 0–99)
Triglycerides: 2719 mg/dL — ABNORMAL HIGH (ref <150)
VLDL: UNDETERMINED mg/dL (ref 0–40)

## 2010-06-16 LAB — CBC
HCT: 33.1 % — ABNORMAL LOW (ref 39.0–52.0)
HCT: 36.7 % — ABNORMAL LOW (ref 39.0–52.0)
HCT: 43 % (ref 39.0–52.0)
Hemoglobin: 11.4 g/dL — ABNORMAL LOW (ref 13.0–17.0)
Hemoglobin: 12.8 g/dL — ABNORMAL LOW (ref 13.0–17.0)
Hemoglobin: 14.7 g/dL (ref 13.0–17.0)
MCHC: 34.3 g/dL (ref 30.0–36.0)
MCHC: 34.5 g/dL (ref 30.0–36.0)
MCHC: 34.8 g/dL (ref 30.0–36.0)
MCV: 96.9 fL (ref 78.0–100.0)
MCV: 97.6 fL (ref 78.0–100.0)
MCV: 98.8 fL (ref 78.0–100.0)
Platelets: 264 10*3/uL (ref 150–400)
Platelets: 264 10*3/uL (ref 150–400)
Platelets: 276 10*3/uL (ref 150–400)
RBC: 3.39 MIL/uL — ABNORMAL LOW (ref 4.22–5.81)
RBC: 3.79 MIL/uL — ABNORMAL LOW (ref 4.22–5.81)
RBC: 4.35 MIL/uL (ref 4.22–5.81)
RDW: 13.3 % (ref 11.5–15.5)
RDW: 13.5 % (ref 11.5–15.5)
RDW: 13.7 % (ref 11.5–15.5)
WBC: 12.5 10*3/uL — ABNORMAL HIGH (ref 4.0–10.5)
WBC: 15.6 10*3/uL — ABNORMAL HIGH (ref 4.0–10.5)
WBC: 16 10*3/uL — ABNORMAL HIGH (ref 4.0–10.5)

## 2010-06-16 LAB — AMYLASE: Amylase: 29 U/L (ref 0–105)

## 2010-06-16 LAB — MAGNESIUM: Magnesium: 3 mg/dL — ABNORMAL HIGH (ref 1.5–2.5)

## 2010-06-16 LAB — BASIC METABOLIC PANEL
BUN: 22 mg/dL (ref 6–23)
CO2: 30 mEq/L (ref 19–32)
Calcium: 9.5 mg/dL (ref 8.4–10.5)
Chloride: 94 mEq/L — ABNORMAL LOW (ref 96–112)
Creatinine, Ser: 1.46 mg/dL (ref 0.4–1.5)
GFR calc Af Amer: >60 mL/min (ref >60)
GFR calc non Af Amer: 53 mL/min — ABNORMAL LOW (ref >60)
Glucose, Bld: 153 mg/dL — ABNORMAL HIGH (ref 70–99)
Potassium: 4.8 mEq/L (ref 3.5–5.1)
Sodium: 136 mEq/L (ref 135–145)

## 2010-06-16 LAB — LIPASE, BLOOD
Lipase: 18 U/L (ref 11–59)
Lipase: 23 U/L (ref 11–59)
Lipase: 42 U/L (ref 11–59)

## 2010-06-16 LAB — HEMOGLOBIN A1C
Hgb A1c MFr Bld: 6.4 % — ABNORMAL HIGH (ref 4.6–6.1)
Mean Plasma Glucose: 137 mg/dL

## 2010-06-16 LAB — PROTIME-INR
INR: 0.97 (ref 0.00–1.49)
Prothrombin Time: 12.8 seconds (ref 11.6–15.2)

## 2010-06-16 LAB — HOMOCYSTEINE: Homocysteine: 6.9 umol/L (ref 4.0–15.4)

## 2010-06-16 LAB — PHOSPHORUS: Phosphorus: 3.9 mg/dL (ref 2.3–4.6)

## 2010-06-16 LAB — APTT: aPTT: 30 seconds (ref 24–37)

## 2010-06-17 DIAGNOSIS — I319 Disease of pericardium, unspecified: Secondary | ICD-10-CM

## 2010-06-17 LAB — BASIC METABOLIC PANEL
BUN: 25 mg/dL — ABNORMAL HIGH (ref 6–23)
CO2: 32 mEq/L (ref 19–32)
Calcium: 9.1 mg/dL (ref 8.4–10.5)
Chloride: 96 mEq/L (ref 96–112)
Creatinine, Ser: 1.53 mg/dL — ABNORMAL HIGH (ref 0.4–1.5)
GFR calc Af Amer: >60 mL/min (ref >60)
GFR calc non Af Amer: 50 mL/min — ABNORMAL LOW (ref >60)
Glucose, Bld: 94 mg/dL (ref 70–99)
Potassium: 4.6 mEq/L (ref 3.5–5.1)
Sodium: 135 mEq/L (ref 135–145)

## 2010-06-24 ENCOUNTER — Other Ambulatory Visit (INDEPENDENT_AMBULATORY_CARE_PROVIDER_SITE_OTHER): Payer: Self-pay | Admitting: *Deleted

## 2010-06-24 ENCOUNTER — Other Ambulatory Visit (INDEPENDENT_AMBULATORY_CARE_PROVIDER_SITE_OTHER): Payer: Self-pay

## 2010-06-24 DIAGNOSIS — E876 Hypokalemia: Secondary | ICD-10-CM

## 2010-06-24 DIAGNOSIS — E878 Other disorders of electrolyte and fluid balance, not elsewhere classified: Secondary | ICD-10-CM

## 2010-06-24 DIAGNOSIS — R0789 Other chest pain: Secondary | ICD-10-CM

## 2010-06-24 NOTE — Discharge Summary (Addendum)
NAME:  Harold [name], Harold [name] NO.:  [id number]  MEDICAL RECORD NO.:  [id number]           PATIENT TYPE:  I  LOCATION:  2029                         FACILITY:  [facility]  PHYSICIAN:  Rollene Rotunda, MD, FACCDATE OF BIRTH:  22-May-1965  DATE OF ADMISSION:  06/10/2010 DATE OF DISCHARGE:  06/17/2010                              DISCHARGE SUMMARY   DISCHARGE DIAGNOSES: 1. 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     a.     Discharge weight of 101.2 kg.     b.     Lasix increased to 80 mg daily at discharge.     c.     Underwent right heart catheterization demonstrating      constricted hemodynamics June 14, 2010, by Dr. Clifton Isley Zinni. 2. History of acute pericarditis March 26, 2010 - March 29, 2010,     with echo at that time showing EF of 55-60% with mild LVH, trivial     MR and a mild-to-moderate circumstantial effusion without     tamponade. 3. Hypertriglyceridemia. 4. Obesity. 5. Metabolic syndrome. 6. Hypertension. 7. Depression/anxiety. 8. Gastroesophageal reflux disease. 9. History of pancreatitis with admission in 2011, questionably     related to triglycerides. 10.Chronic back pain. 11.Status post tonsillectomy and adenoidectomy. 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 13.Probable obstructive sleep apnea, for sleep study with Dr. Shelle Iron.  HOSPITAL COURSE:  Harold [name] is a 45 year old gentleman with a history of acute pericarditis, hypertension and hypertriglyceridemia as well as probable obstructive sleep apnea who was seen in the office with complaints of edema, having gained 30 pounds since an appointment earlier this year.  He was found to be in massive volume overload with swelling extending above his abdomen consistent with anasarca.  He has had some dyspnea with exertion, but did not describe any PND or orthopnea.  He denied any chest pressure, neck or arm discomfort, palpitations, syncope or presyncope.  Given his past history  and thickening of the pericardium, Dr. Antoine Poche postulated that this was constrictive pericarditis and to admit the patient in the hospital for diuresis.  He was initiated on 80 mg IV b.i.d. of Lasix and diuresed well.  His potassium had to be repleted secondary to hypokalemia.  He did have some problems with hypertension, so his lisinopril was increased to 20 mg b.i.d.  He was to have a heart catheterization on Friday the 16th, however, this was delayed until June 14, 2010, secondary to scheduling issues.  He underwent right heart catheterization which showed hemodynamic constrictive with construction. There was also obstructive sleep apnea that demonstrated during periods of the apnea while on the catheterization table during which time the patient dropped to saturations into the low 60s.  Dr. Antoine Poche has discussed the patient with Dr. Shelle Iron who is working to expedite the patient's sleep study.  He was continued on diuresis, which did include a brief trial of metolazone.  Today, the patient is doing well with much improved edema.  The first weight in the computer is noted at 109.3 kg and today's weight is 101.2 kg.  Dr. Antoine Poche has seen and examined him today and feels he is  stable for discharge.  He did have some acute renal insufficiency with a creatinine of 1.53, but unfortunately unaffected creatinine clearance, and Dr. Antoine Poche will changing his lisinopril/HCT to lisinopril 20 mg daily.  Please see below for consolidative medicine list.  DISCHARGE LABORATORY DATA:  WBC 8.1, hemoglobin 13.7, hematocrit 43.7, platelet count 418.  Sodium 135, potassium 4.6, chloride 96, CO2 of 32, glucose 94, BUN 25, creatinine 1.53.  LFTs were elevated on admission with alk phos of 123 and AST of 54 and a total bilirubin 3.7, BNP was 594.  The LFT elevations were felt consistent with hepatic congestion. Magnesium was 2.1 on admission.  TSH was 0.824.  STUDIES: 1. Chest x-ray June 02, 2010,  showed new right pleural effusion with     associated right lower lobe atelectasis versus infiltrate. 2. Right heart catheterization, please see full report for details as     well as hospital course for summary.  DISCHARGE MEDICATIONS: 1. Lisinopril 20 mg daily. 2. Potassium chloride 20 mEq daily. 3. Lasix 80 mg daily. 4. Bystolic 5 mg daily. 5. Citalopram 20 mg daily. 6. Crestor 10 mg daily. 7. Fenofibrate 145 mg daily. 8. Hydrocodone/APAP 7.5/750 mg p.o. q6 h. p.r.n. pain. 9. Multivitamin 1 tablet daily. 10.Vitamin B12 OTC 1 tablet sublingual 3 times a week. 11.Zantac 150 mg daily. 12.Zyrtec 10 mg half tablet daily.  DISPOSITION:  Harold [name] will be discharged in stable condition to home. He is not to lift anything over 5 pounds for 5 days or participate in sexual activity for 5 days.  He is to follow a low-sodium heart-healthy diet and to call or return if he notices any pain, swelling, bleeding or pus at this cath site.  He will follow up for a BMET on June 24, 2010, at 10 a.m. and then has his next appointment with Dr. Antoine Poche July 22, 2010, at 4:15 cm.  Dr. Teddy Spike office is also apparently trying to expedite his sleep study per Dr. Jenene Slicker note, and the patient will be instructed to call Dr. Teddy Spike office within 3 days if he has not heard anything back.  DURATION OF DISCHARGE ENCOUNTER:  Greater than 30 minutes including physician and PA time.     Ronie Spies, P.A.C.   ______________________________ Rollene Rotunda, MD, [facility]    DD/MEDQ  D:  06/17/2010  T:  06/18/2010  Job:  [id number]  cc:   Rollene Rotunda, MD, [facility] Barbaraann Share, MD,FCCP Eugenio Hoes Tawanna Cooler, MD  Electronically Signed by Ronie Spies  on 06/24/2010 12:23:31 PM Electronically Signed by Rollene Rotunda MD [facility] on 07/23/2010 11:42:57 AM

## 2010-06-24 NOTE — Assessment & Plan Note (Signed)
Summary: consult for possible osa   Visit Type:  Initial Consult Copy to:  Kelle Darting Primary Provider/Referring Provider:  Roderick Pee MD  CC:  Sleep Consult.  Pt states he snores while sleeping at night, has difficulty breathing while sleeping, and and states he is tired throughout the day. [name]  History of Present Illness: The pt is a 44y/o male who I have been asked to see for possible osa.  Presents with the following: +loud snoring, +abnl breathing pattern frequent awakenings at night, nonrestorative sleep, sleeps 1-3a to 9a. +sleepiness with any period of inactivity, falls asleep with tv in evening epworth 16, weight up 20 pounds in the past 2 yrs.  Current Medications (verified): 1)  Celexa 20 Mg Tabs (Citalopram Hydrobromide) .... Take One Tablet By Mouth Daily 2)  Cialis 20 Mg  Tabs (Tadalafil) .... Uad 3)  Vicodin Es 7.5-750 Mg Tabs (Hydrocodone-Acetaminophen) .... Take 1 Tablet By Mouth Four Times A Day 4)  Crestor 10 Mg Tabs (Rosuvastatin Calcium) .... Take One Tablet By Mouth Daily 5)  Flexeril 10 Mg Tabs (Cyclobenzaprine Hcl) .... Take One Tab By Mouth Three Times A Day As Needed For For Muscle Spasm 6)  Zestoretic 20-25 Mg Tabs (Lisinopril-Hydrochlorothiazide) .... Take 1 Tablet By Mouth Every Morning 7)  Bystolic 5 Mg Tabs (Nebivolol Hcl) .... Take 1 Tablet By Mouth Daily 8)  Tricor 145 Mg Tabs (Fenofibrate) .... One Tab Once Daily 9)  Klor-Con 10 10 Meq Cr-Tabs (Potassium Chloride) .... Take One Tab By Mouth Once Daily 10)  Furosemide 20 Mg Tabs (Furosemide) .... Take 1 Tablet By Mouth Once A Day  Allergies (verified): 1)  ! Colchicine 2)  Benadryl (Diphenhydramine Hcl)  Past History:  Past Medical History: Reviewed history from 04/08/2010 and no changes required. Admx 03/26/10-03/29/10: acute pericarditis   a. echo 03/26/10: EF 55-60%; mild LVH; trivial MR; RVF ok; mild-mod circumferential Eff w/o tamponade Hypertriglyceridemia   a. followed in [facility] obesity metabolic syndrome Hypertension Depression/Anxiety GERD h/o Pancreatitis   a. admx 04/2009 . . . ? 2/2 triglycerides chronic back pain  Past Surgical History: Tonsillectomy carcoid tumor colon  Family History: Reviewed history from 04/08/2010 and no changes required. Family History Depression Family History Diabetes 1st degree relative Family History High cholesterol Family History Hypertension Father has a h/o pancreatitis  emphysema: father asthma: sister, father heart disease: father cancer: father (colon)   Social History: Reviewed history from 04/08/2010 and no changes required. pt is divorced. pt has 1 children. Smoker.  started at age 89.  3 cigs daily. pt is unemployed. applying for disability. Drinks alcohol several times a week and mostly on the weekends  Review of Systems       The patient complains of shortness of breath with activity, shortness of breath at rest, productive cough, non-productive cough, acid heartburn, indigestion, weight change, difficulty swallowing, nasal congestion/difficulty breathing through nose, sneezing, itching, ear ache, anxiety, depression, hand/feet swelling, joint stiffness or pain, and change in color of mucus.  The patient denies coughing up blood, chest pain, irregular heartbeats, loss of appetite, abdominal pain, sore throat, tooth/dental problems, headaches, rash, and fever.    Vital Signs:  Patient profile:   45 year old male Height:      70 inches Weight:      252.25 pounds BMI:     36.32 O2 Sat:      96 % on Room air Pulse rate:   72 / minute BP sitting:   132 /  100  (right arm) Cuff size:   large  Vitals Entered By: Arman Filter LPN (June 03, 100 9:43 AM)  O2 Flow:  Room air CC: Sleep Consult.  Pt states he snores while sleeping at night, has difficulty breathing while sleeping,  and states he is tired throughout the day.  Comments Medications reviewed with patient Arman Filter LPN  June 04, 2010 9:43 AM    Physical Exam  General:  obese male in nad  Eyes:  PERRLA and EOMI.   Nose:  large turbs, but patent bilat Mouth:  elongation of soft palate and uvula, no lesions or exudates seen Neck:  no jvd, tmg, LN  Lungs:  clear to auscultation  Heart:  rrr, no mrg Abdomen:  soft and nontender, bs+ Extremities:  2+ edema bilat, no cyanosis  pulses intact distally Neurologic:  appears very sleepy, moves all 4    Impression & Recommendations:  Problem # 1:  OBSTRUCTIVE SLEEP APNEA (ICD-327.23) the pt's history is very suggestive of osa.  I think he needs to have a sleep study for diagnosis, and the pt is agreeable.  I have had a long discussion with the pt about sleep apnea, including its impact on QOL and CV health.  Medications Added to Medication List This Visit: 1)  Furosemide 20 Mg Tabs (Furosemide) .... Take 1 tablet by mouth once a day  Other Orders: Consultation Level IV (72536) Sleep Disorder Referral (Sleep Disorder)  Patient Instructions: 1)  will schedule for sleep study 2)  work on weight loss 3)  will arrange followup once results available

## 2010-06-25 LAB — BASIC METABOLIC PANEL
BUN: 19 mg/dL (ref 6–23)
CO2: 31 mEq/L (ref 19–32)
Calcium: 9 mg/dL (ref 8.4–10.5)
Chloride: 101 mEq/L (ref 96–112)
Creatinine, Ser: 1.2 mg/dL (ref 0.4–1.5)
GFR: 89.29 mL/min (ref >60.00)
Glucose, Bld: 100 mg/dL — ABNORMAL HIGH (ref 70–99)
Potassium: 4.1 mEq/L (ref 3.5–5.1)
Sodium: 138 mEq/L (ref 135–145)

## 2010-06-29 ENCOUNTER — Encounter: Payer: Self-pay | Admitting: Family Medicine

## 2010-06-29 ENCOUNTER — Ambulatory Visit (INDEPENDENT_AMBULATORY_CARE_PROVIDER_SITE_OTHER): Payer: Self-pay | Admitting: Family Medicine

## 2010-06-29 ENCOUNTER — Ambulatory Visit (HOSPITAL_BASED_OUTPATIENT_CLINIC_OR_DEPARTMENT_OTHER): Payer: Medicaid Other | Attending: Pulmonary Disease

## 2010-06-29 DIAGNOSIS — G4733 Obstructive sleep apnea (adult) (pediatric): Secondary | ICD-10-CM

## 2010-06-29 DIAGNOSIS — I509 Heart failure, unspecified: Secondary | ICD-10-CM

## 2010-06-29 HISTORY — DX: Heart failure, unspecified: I50.9

## 2010-06-29 NOTE — Patient Instructions (Signed)
Split the Lasix does take 40 mg in the morning and 40 mg around 2 p.m..  Take an extra 40 mg of Lasix when you get home.  This afternoon.  Avoid sitting.  Keep her feet elevated continuously.  Complete no salt diet.  Daily weights at home.  Follow-up in cardiology next week or if his symptoms get worse over the weekend call the cardiologist

## 2010-06-29 NOTE — Progress Notes (Signed)
  Subjective:    Patient ID: Harold [name], male    DOB: [birth year], 45 y.o.   MRN: [id number]  HPIlamonte Is a 45 year old single male, who comes in today for evaluation of multiple issues.  The most pressing of which is a recent hospitalization for congestive heart failure.  He states he was hospitalized for 7 days and on discharge he had no peripheral edema.  Now.  His legs are swollen again.  He states his been compliant with his medication.  They increased his Lasix from 20 mg a day to 80.  Electrolytes drawn 5 days ago shows a normal potassium of 4.1, creatinine 1.2, GFR of 90.  He states he has to use his sister.  oxygen sometimes at night to sleep because he wakes up short of breath!!!!!!!!!!!!!  He also has multiple other medical problems, including a possible sleep apnea, hypertension, which is 108/80.  Today, history of metabolic syndrome, tobacco abuse, however, he states he is not smoking now.   Review of Systems    General and cardiovascular review of systems negative except for above Objective:   Physical Exam    Well-developed well-nourished man, acute distress.  Cardiac exam shows no murmurs.  Lung exam clear.  No crackles, 2+ peripheral edema.  Right left    Assessment & Plan:  Congestive heart failure, ,,,,,,,,,,, split the Lasix take 40 mg in the morning, and another 40-mg dose around 2 p.m.,,,,,,,, complete no salt diet,,,,,,,,,, elevate his feet continuously see cardiology on Monday for follow-up

## 2010-07-01 ENCOUNTER — Ambulatory Visit: Payer: Self-pay | Admitting: Family Medicine

## 2010-07-01 ENCOUNTER — Emergency Department (HOSPITAL_COMMUNITY): Payer: Medicaid Other

## 2010-07-01 ENCOUNTER — Inpatient Hospital Stay (HOSPITAL_COMMUNITY)
Admission: EM | Admit: 2010-07-01 | Discharge: 2010-07-10 | DRG: 237 | Disposition: A | Discharge status: Home Health | Payer: Medicaid Other | Attending: Cardiothoracic Surgery | Admitting: Cardiothoracic Surgery

## 2010-07-01 DIAGNOSIS — G4733 Obstructive sleep apnea (adult) (pediatric): Secondary | ICD-10-CM | POA: Diagnosis present

## 2010-07-01 DIAGNOSIS — F329 Major depressive disorder, single episode, unspecified: Secondary | ICD-10-CM | POA: Diagnosis present

## 2010-07-01 DIAGNOSIS — I059 Rheumatic mitral valve disease, unspecified: Secondary | ICD-10-CM | POA: Diagnosis present

## 2010-07-01 DIAGNOSIS — R0602 Shortness of breath: Secondary | ICD-10-CM

## 2010-07-01 DIAGNOSIS — E669 Obesity, unspecified: Secondary | ICD-10-CM | POA: Diagnosis present

## 2010-07-01 DIAGNOSIS — G609 Hereditary and idiopathic neuropathy, unspecified: Secondary | ICD-10-CM | POA: Diagnosis present

## 2010-07-01 DIAGNOSIS — M549 Dorsalgia, unspecified: Secondary | ICD-10-CM | POA: Diagnosis present

## 2010-07-01 DIAGNOSIS — I10 Essential (primary) hypertension: Secondary | ICD-10-CM | POA: Diagnosis present

## 2010-07-01 DIAGNOSIS — E781 Pure hyperglyceridemia: Secondary | ICD-10-CM | POA: Diagnosis present

## 2010-07-01 DIAGNOSIS — E7800 Pure hypercholesterolemia, unspecified: Secondary | ICD-10-CM | POA: Diagnosis present

## 2010-07-01 DIAGNOSIS — I5043 Acute on chronic combined systolic (congestive) and diastolic (congestive) heart failure: Secondary | ICD-10-CM | POA: Diagnosis present

## 2010-07-01 DIAGNOSIS — F17200 Nicotine dependence, unspecified, uncomplicated: Secondary | ICD-10-CM | POA: Diagnosis present

## 2010-07-01 DIAGNOSIS — M1990 Unspecified osteoarthritis, unspecified site: Secondary | ICD-10-CM | POA: Diagnosis present

## 2010-07-01 DIAGNOSIS — F3289 Other specified depressive episodes: Secondary | ICD-10-CM | POA: Diagnosis present

## 2010-07-01 DIAGNOSIS — F411 Generalized anxiety disorder: Secondary | ICD-10-CM | POA: Diagnosis present

## 2010-07-01 DIAGNOSIS — I311 Chronic constrictive pericarditis: Principal | ICD-10-CM | POA: Diagnosis present

## 2010-07-01 DIAGNOSIS — G8929 Other chronic pain: Secondary | ICD-10-CM | POA: Diagnosis present

## 2010-07-01 DIAGNOSIS — K219 Gastro-esophageal reflux disease without esophagitis: Secondary | ICD-10-CM | POA: Diagnosis present

## 2010-07-01 LAB — COMPREHENSIVE METABOLIC PANEL
ALT: 22 U/L (ref 0–53)
AST: 69 U/L — ABNORMAL HIGH (ref 0–37)
Albumin: 3.1 g/dL — ABNORMAL LOW (ref 3.5–5.2)
Alkaline Phosphatase: 96 U/L (ref 39–117)
BUN: 16 mg/dL (ref 6–23)
CO2: 25 mEq/L (ref 19–32)
Calcium: 8.6 mg/dL (ref 8.4–10.5)
Chloride: 101 mEq/L (ref 96–112)
Creatinine, Ser: 1.13 mg/dL (ref 0.4–1.5)
GFR calc Af Amer: >60 mL/min (ref >60)
GFR calc non Af Amer: >60 mL/min (ref >60)
Glucose, Bld: 141 mg/dL — ABNORMAL HIGH (ref 70–99)
Potassium: 4.6 mEq/L (ref 3.5–5.1)
Sodium: 135 mEq/L (ref 135–145)
Total Bilirubin: 3.3 mg/dL — ABNORMAL HIGH (ref 0.3–1.2)
Total Protein: 7.4 g/dL (ref 6.0–8.3)

## 2010-07-01 LAB — DIFFERENTIAL
Basophils Absolute: 0.1 10*3/uL (ref 0.0–0.1)
Basophils Relative: 1 % (ref 0–1)
Eosinophils Absolute: 0.1 10*3/uL (ref 0.0–0.7)
Eosinophils Relative: 1 % (ref 0–5)
Lymphocytes Relative: 20 % (ref 12–46)
Lymphs Abs: 1.7 10*3/uL (ref 0.7–4.0)
Monocytes Absolute: 1 10*3/uL (ref 0.1–1.0)
Monocytes Relative: 12 % (ref 3–12)
Neutro Abs: 5.5 10*3/uL (ref 1.7–7.7)
Neutrophils Relative %: 66 % (ref 43–77)

## 2010-07-01 LAB — TROPONIN I: Troponin I: 0.02 ng/mL (ref 0.00–0.06)

## 2010-07-01 LAB — PROTIME-INR
INR: 1.44 (ref 0.00–1.49)
Prothrombin Time: 17.7 seconds — ABNORMAL HIGH (ref 11.6–15.2)

## 2010-07-01 LAB — CK TOTAL AND CKMB (NOT AT ARMC)
CK, MB: 2.3 ng/mL (ref 0.3–4.0)
Relative Index: 1.7 (ref 0.0–2.5)
Total CK: 139 U/L (ref 7–232)

## 2010-07-01 LAB — CBC
HCT: 44.7 % (ref 39.0–52.0)
Hemoglobin: 13.8 g/dL (ref 13.0–17.0)
MCH: 26 pg (ref 26.0–34.0)
MCHC: 30.9 g/dL (ref 30.0–36.0)
MCV: 84.2 fL (ref 78.0–100.0)
Platelets: 366 10*3/uL (ref 150–400)
RBC: 5.31 MIL/uL (ref 4.22–5.81)
RDW: 19.4 % — ABNORMAL HIGH (ref 11.5–15.5)
WBC: 8.3 10*3/uL (ref 4.0–10.5)

## 2010-07-01 LAB — BRAIN NATRIURETIC PEPTIDE: Pro B Natriuretic peptide (BNP): 433 pg/mL — ABNORMAL HIGH (ref 0.0–100.0)

## 2010-07-01 LAB — LIPASE, BLOOD: Lipase: 40 U/L (ref 11–59)

## 2010-07-02 ENCOUNTER — Inpatient Hospital Stay (HOSPITAL_COMMUNITY): Payer: Medicaid Other

## 2010-07-02 ENCOUNTER — Encounter (HOSPITAL_COMMUNITY): Payer: Self-pay | Admitting: Radiology

## 2010-07-02 DIAGNOSIS — I309 Acute pericarditis, unspecified: Secondary | ICD-10-CM

## 2010-07-02 DIAGNOSIS — R609 Edema, unspecified: Secondary | ICD-10-CM

## 2010-07-02 LAB — COMPREHENSIVE METABOLIC PANEL
ALT: 21 U/L (ref 0–53)
AST: 42 U/L — ABNORMAL HIGH (ref 0–37)
Albumin: 3 g/dL — ABNORMAL LOW (ref 3.5–5.2)
Alkaline Phosphatase: 101 U/L (ref 39–117)
BUN: 18 mg/dL (ref 6–23)
CO2: 23 mEq/L (ref 19–32)
Calcium: 8.7 mg/dL (ref 8.4–10.5)
Chloride: 104 mEq/L (ref 96–112)
Creatinine, Ser: 1 mg/dL (ref 0.4–1.5)
GFR calc Af Amer: >60 mL/min (ref >60)
GFR calc non Af Amer: >60 mL/min (ref >60)
Glucose, Bld: 124 mg/dL — ABNORMAL HIGH (ref 70–99)
Potassium: 3.7 mEq/L (ref 3.5–5.1)
Sodium: 136 mEq/L (ref 135–145)
Total Bilirubin: 2.6 mg/dL — ABNORMAL HIGH (ref 0.3–1.2)
Total Protein: 7 g/dL (ref 6.0–8.3)

## 2010-07-02 LAB — CBC
HCT: 43.4 % (ref 39.0–52.0)
Hemoglobin: 13.6 g/dL (ref 13.0–17.0)
MCH: 26.5 pg (ref 26.0–34.0)
MCHC: 31.3 g/dL (ref 30.0–36.0)
MCV: 84.4 fL (ref 78.0–100.0)
Platelets: 340 10*3/uL (ref 150–400)
RBC: 5.14 MIL/uL (ref 4.22–5.81)
RDW: 19.3 % — ABNORMAL HIGH (ref 11.5–15.5)
WBC: 7.5 10*3/uL (ref 4.0–10.5)

## 2010-07-02 LAB — COMPREHENSIVE METABOLIC PANEL WITH GFR
ALT: 26 U/L (ref 0–53)
AST: 49 U/L — ABNORMAL HIGH (ref 0–37)
Albumin: 3.1 g/dL — ABNORMAL LOW (ref 3.5–5.2)
Alkaline Phosphatase: 98 U/L (ref 39–117)
BUN: 18 mg/dL (ref 6–23)
CO2: 27 meq/L (ref 19–32)
Calcium: 8.8 mg/dL (ref 8.4–10.5)
Chloride: 102 meq/L (ref 96–112)
Creatinine, Ser: 1.12 mg/dL (ref 0.4–1.5)
GFR calc non Af Amer: >60 mL/min
Glucose, Bld: 95 mg/dL (ref 70–99)
Potassium: 4.1 meq/L (ref 3.5–5.1)
Sodium: 136 meq/L (ref 135–145)
Total Bilirubin: 2.8 mg/dL — ABNORMAL HIGH (ref 0.3–1.2)
Total Protein: 7 g/dL (ref 6.0–8.3)

## 2010-07-02 LAB — LIPID PANEL
Cholesterol: 76 mg/dL (ref 0–200)
HDL: 10 mg/dL — ABNORMAL LOW (ref >39)
LDL Cholesterol: 49 mg/dL (ref 0–99)
Total CHOL/HDL Ratio: 7.6 RATIO
Triglycerides: 85 mg/dL (ref <150)
VLDL: 17 mg/dL (ref 0–40)

## 2010-07-02 LAB — DIFFERENTIAL
Basophils Absolute: 0.1 10*3/uL (ref 0.0–0.1)
Basophils Relative: 1 % (ref 0–1)
Eosinophils Absolute: 0.2 10*3/uL (ref 0.0–0.7)
Eosinophils Relative: 2 % (ref 0–5)
Lymphocytes Relative: 26 % (ref 12–46)
Lymphs Abs: 2 10*3/uL (ref 0.7–4.0)
Monocytes Absolute: 0.9 10*3/uL (ref 0.1–1.0)
Monocytes Relative: 12 % (ref 3–12)
Neutro Abs: 4.4 10*3/uL (ref 1.7–7.7)
Neutrophils Relative %: 59 % (ref 43–77)

## 2010-07-02 LAB — BRAIN NATRIURETIC PEPTIDE: Pro B Natriuretic peptide (BNP): 443 pg/mL — ABNORMAL HIGH (ref 0.0–100.0)

## 2010-07-02 MED ORDER — IOHEXOL 300 MG/ML  SOLN
80.0000 mL | Freq: Once | INTRAMUSCULAR | Status: AC | PRN
  Administered 2010-07-02: 80 mL via INTRAVENOUS

## 2010-07-03 LAB — BASIC METABOLIC PANEL
BUN: 17 mg/dL (ref 6–23)
CO2: 28 mEq/L (ref 19–32)
Calcium: 9 mg/dL (ref 8.4–10.5)
Chloride: 100 mEq/L (ref 96–112)
Creatinine, Ser: 1.08 mg/dL (ref 0.4–1.5)
GFR calc Af Amer: >60 mL/min (ref >60)
GFR calc non Af Amer: >60 mL/min (ref >60)
Glucose, Bld: 89 mg/dL (ref 70–99)
Potassium: 4.1 mEq/L (ref 3.5–5.1)
Sodium: 137 mEq/L (ref 135–145)

## 2010-07-03 LAB — PROTIME-INR
INR: 1.34 (ref 0.00–1.49)
Prothrombin Time: 16.8 seconds — ABNORMAL HIGH (ref 11.6–15.2)

## 2010-07-03 LAB — APTT: aPTT: 29 seconds (ref 24–37)

## 2010-07-04 ENCOUNTER — Inpatient Hospital Stay (HOSPITAL_COMMUNITY): Payer: Medicaid Other

## 2010-07-04 LAB — COMPREHENSIVE METABOLIC PANEL
ALT: 19 U/L (ref 0–53)
AST: 42 U/L — ABNORMAL HIGH (ref 0–37)
Albumin: 2.9 g/dL — ABNORMAL LOW (ref 3.5–5.2)
Alkaline Phosphatase: 93 U/L (ref 39–117)
BUN: 17 mg/dL (ref 6–23)
CO2: 27 mEq/L (ref 19–32)
Calcium: 8.5 mg/dL (ref 8.4–10.5)
Chloride: 98 mEq/L (ref 96–112)
Creatinine, Ser: 1.14 mg/dL (ref 0.4–1.5)
GFR calc Af Amer: >60 mL/min (ref >60)
GFR calc non Af Amer: >60 mL/min (ref >60)
Glucose, Bld: 117 mg/dL — ABNORMAL HIGH (ref 70–99)
Potassium: 3.7 mEq/L (ref 3.5–5.1)
Sodium: 133 mEq/L — ABNORMAL LOW (ref 135–145)
Total Bilirubin: 2.4 mg/dL — ABNORMAL HIGH (ref 0.3–1.2)
Total Protein: 6.9 g/dL (ref 6.0–8.3)

## 2010-07-04 LAB — BLOOD GAS, ARTERIAL
Acid-Base Excess: 0.7 mmol/L (ref 0.0–2.0)
Bicarbonate: 24.3 mEq/L — ABNORMAL HIGH (ref 20.0–24.0)
Drawn by: 23337
FIO2: 0.21 %
O2 Saturation: 95.4 %
Patient temperature: 98.6
TCO2: 25.4 mmol/L (ref 0–100)
pCO2 arterial: 35.6 mmHg (ref 35.0–45.0)
pH, Arterial: 7.449 (ref 7.350–7.450)
pO2, Arterial: 76.5 mmHg — ABNORMAL LOW (ref 80.0–100.0)

## 2010-07-04 LAB — URINALYSIS, ROUTINE W REFLEX MICROSCOPIC
Bilirubin Urine: NEGATIVE
Glucose, UA: NEGATIVE mg/dL
Hgb urine dipstick: NEGATIVE
Ketones, ur: NEGATIVE mg/dL
Nitrite: NEGATIVE
Protein, ur: NEGATIVE mg/dL
Specific Gravity, Urine: 1.014 (ref 1.005–1.030)
Urobilinogen, UA: 2 mg/dL — ABNORMAL HIGH (ref 0.0–1.0)
pH: 6 (ref 5.0–8.0)

## 2010-07-04 LAB — CBC
HCT: 43.6 % (ref 39.0–52.0)
Hemoglobin: 13.4 g/dL (ref 13.0–17.0)
MCH: 26.2 pg (ref 26.0–34.0)
MCHC: 30.7 g/dL (ref 30.0–36.0)
MCV: 85.3 fL (ref 78.0–100.0)
Platelets: 336 10*3/uL (ref 150–400)
RBC: 5.11 MIL/uL (ref 4.22–5.81)
RDW: 19.4 % — ABNORMAL HIGH (ref 11.5–15.5)
WBC: 6.2 10*3/uL (ref 4.0–10.5)

## 2010-07-04 LAB — TYPE AND SCREEN
ABO/RH(D): A POS
Antibody Screen: NEGATIVE

## 2010-07-04 LAB — ABO/RH: ABO/RH(D): A POS

## 2010-07-04 LAB — PROTIME-INR
INR: 1.34 (ref 0.00–1.49)
Prothrombin Time: 16.8 seconds — ABNORMAL HIGH (ref 11.6–15.2)

## 2010-07-04 LAB — MRSA PCR SCREENING: MRSA by PCR: NEGATIVE

## 2010-07-05 ENCOUNTER — Inpatient Hospital Stay (HOSPITAL_COMMUNITY): Payer: Medicaid Other

## 2010-07-05 ENCOUNTER — Other Ambulatory Visit: Payer: Self-pay | Admitting: Cardiothoracic Surgery

## 2010-07-05 DIAGNOSIS — I309 Acute pericarditis, unspecified: Secondary | ICD-10-CM

## 2010-07-05 LAB — POCT I-STAT 3, ART BLOOD GAS (G3+)
Acid-Base Excess: 1 mmol/L (ref 0.0–2.0)
Acid-Base Excess: 3 mmol/L — ABNORMAL HIGH (ref 0.0–2.0)
Acid-Base Excess: 2 mmol/L (ref 0.0–2.0)
Acid-Base Excess: 2 mmol/L (ref 0.0–2.0)
Bicarbonate: 25.4 mEq/L — ABNORMAL HIGH (ref 20.0–24.0)
Bicarbonate: 26.8 mEq/L — ABNORMAL HIGH (ref 20.0–24.0)
Bicarbonate: 26.9 mEq/L — ABNORMAL HIGH (ref 20.0–24.0)
Bicarbonate: 27 mEq/L — ABNORMAL HIGH (ref 20.0–24.0)
O2 Saturation: 100 %
O2 Saturation: 97 %
O2 Saturation: 97 %
O2 Saturation: 97 %
Patient temperature: 36.1
Patient temperature: 36.5
Patient temperature: 36.5
TCO2: 27 mmol/L (ref 0–100)
TCO2: 28 mmol/L (ref 0–100)
TCO2: 28 mmol/L (ref 0–100)
TCO2: 28 mmol/L (ref 0–100)
pCO2 arterial: 40.7 mmHg (ref 35.0–45.0)
pCO2 arterial: 37.3 mmHg (ref 35.0–45.0)
pCO2 arterial: 41.4 mmHg (ref 35.0–45.0)
pCO2 arterial: 42.4 mmHg (ref 35.0–45.0)
pH, Arterial: 7.404 (ref 7.350–7.450)
pH, Arterial: 7.46 — ABNORMAL HIGH (ref 7.350–7.450)
pH, Arterial: 7.419 (ref 7.350–7.450)
pH, Arterial: 7.41 (ref 7.350–7.450)
pO2, Arterial: 198 mmHg — ABNORMAL HIGH (ref 80.0–100.0)
pO2, Arterial: 81 mmHg (ref 80.0–100.0)
pO2, Arterial: 83 mmHg (ref 80.0–100.0)
pO2, Arterial: 87 mmHg (ref 80.0–100.0)

## 2010-07-05 LAB — POCT I-STAT, CHEM 8
BUN: 16 mg/dL (ref 6–23)
Calcium, Ion: 1.2 mmol/L (ref 1.12–1.32)
Chloride: 107 mEq/L (ref 96–112)
Creatinine, Ser: 1 mg/dL (ref 0.4–1.5)
Glucose, Bld: 119 mg/dL — ABNORMAL HIGH (ref 70–99)
HCT: 43 % (ref 39.0–52.0)
Hemoglobin: 14.6 g/dL (ref 13.0–17.0)
Potassium: 5.2 mEq/L — ABNORMAL HIGH (ref 3.5–5.1)
Sodium: 140 mEq/L (ref 135–145)
TCO2: 25 mmol/L (ref 0–100)

## 2010-07-05 LAB — GLUCOSE, SEROUS FLUID: Glucose, Fluid: 104 mg/dL

## 2010-07-05 LAB — MAGNESIUM: Magnesium: 2.7 mg/dL — ABNORMAL HIGH (ref 1.5–2.5)

## 2010-07-05 LAB — POCT I-STAT 4, (NA,K, GLUC, HGB,HCT)
Glucose, Bld: 129 mg/dL — ABNORMAL HIGH (ref 70–99)
Glucose, Bld: 116 mg/dL — ABNORMAL HIGH (ref 70–99)
Glucose, Bld: 140 mg/dL — ABNORMAL HIGH (ref 70–99)
Glucose, Bld: 99 mg/dL (ref 70–99)
HCT: 47 % (ref 39.0–52.0)
HCT: 45 % (ref 39.0–52.0)
HCT: 44 % (ref 39.0–52.0)
HCT: 43 % (ref 39.0–52.0)
Hemoglobin: 16 g/dL (ref 13.0–17.0)
Hemoglobin: 15.3 g/dL (ref 13.0–17.0)
Hemoglobin: 15 g/dL (ref 13.0–17.0)
Hemoglobin: 14.6 g/dL (ref 13.0–17.0)
Potassium: 3.9 mEq/L (ref 3.5–5.1)
Potassium: 3.3 mEq/L — ABNORMAL LOW (ref 3.5–5.1)
Potassium: 3.8 mEq/L (ref 3.5–5.1)
Potassium: 3.7 mEq/L (ref 3.5–5.1)
Sodium: 140 mEq/L (ref 135–145)
Sodium: 140 mEq/L (ref 135–145)
Sodium: 141 mEq/L (ref 135–145)
Sodium: 145 mEq/L (ref 135–145)

## 2010-07-05 LAB — CBC
HCT: 37.3 % — ABNORMAL LOW (ref 39.0–52.0)
HCT: 37.6 % — ABNORMAL LOW (ref 39.0–52.0)
Hemoglobin: 11.6 g/dL — ABNORMAL LOW (ref 13.0–17.0)
Hemoglobin: 11.9 g/dL — ABNORMAL LOW (ref 13.0–17.0)
MCH: 25.8 pg — ABNORMAL LOW (ref 26.0–34.0)
MCH: 26.2 pg (ref 26.0–34.0)
MCHC: 31.1 g/dL (ref 30.0–36.0)
MCHC: 31.6 g/dL (ref 30.0–36.0)
MCV: 83.1 fL (ref 78.0–100.0)
MCV: 82.8 fL (ref 78.0–100.0)
Platelets: 263 10*3/uL (ref 150–400)
Platelets: 273 10*3/uL (ref 150–400)
RBC: 4.49 MIL/uL (ref 4.22–5.81)
RBC: 4.54 MIL/uL (ref 4.22–5.81)
RDW: 18.9 % — ABNORMAL HIGH (ref 11.5–15.5)
RDW: 19.4 % — ABNORMAL HIGH (ref 11.5–15.5)
WBC: 6.4 10*3/uL (ref 4.0–10.5)
WBC: 8.6 10*3/uL (ref 4.0–10.5)

## 2010-07-05 LAB — LACTATE DEHYDROGENASE, PLEURAL OR PERITONEAL FLUID: LD, Fluid: 143 U/L — ABNORMAL HIGH (ref 3–23)

## 2010-07-05 LAB — GRAM STAIN: Gram Stain: NONE SEEN

## 2010-07-05 LAB — BODY FLUID CELL COUNT WITH DIFFERENTIAL
Lymphs, Fluid: 60 %
Monocyte-Macrophage-Serous Fluid: 5 % — ABNORMAL LOW (ref 50–90)
Neutrophil Count, Fluid: 35 % — ABNORMAL HIGH (ref 0–25)
Total Nucleated Cell Count, Fluid: 113 cu mm (ref 0–1000)

## 2010-07-05 LAB — PROTIME-INR
INR: 1.72 — ABNORMAL HIGH (ref 0.00–1.49)
Prothrombin Time: 20.3 seconds — ABNORMAL HIGH (ref 11.6–15.2)

## 2010-07-05 LAB — PROTEIN, BODY FLUID: Total protein, fluid: 3.4 g/dL

## 2010-07-05 LAB — GLUCOSE, CAPILLARY
Glucose-Capillary: 83 mg/dL (ref 70–99)
Glucose-Capillary: 100 mg/dL — ABNORMAL HIGH (ref 70–99)
Glucose-Capillary: 101 mg/dL — ABNORMAL HIGH (ref 70–99)
Glucose-Capillary: 108 mg/dL — ABNORMAL HIGH (ref 70–99)

## 2010-07-05 LAB — POCT I-STAT GLUCOSE
Glucose, Bld: 98 mg/dL (ref 70–99)
Glucose, Bld: 110 mg/dL — ABNORMAL HIGH (ref 70–99)
Operator id: 3408
Operator id: 3408

## 2010-07-05 LAB — CREATININE, SERUM
Creatinine, Ser: 1.01 mg/dL (ref 0.4–1.5)
GFR calc Af Amer: >60 mL/min (ref >60)
GFR calc non Af Amer: >60 mL/min (ref >60)

## 2010-07-05 LAB — APTT: aPTT: 33 seconds (ref 24–37)

## 2010-07-06 ENCOUNTER — Inpatient Hospital Stay (HOSPITAL_COMMUNITY): Payer: Medicaid Other

## 2010-07-06 LAB — POCT I-STAT, CHEM 8
BUN: 21 mg/dL (ref 6–23)
Calcium, Ion: 1.17 mmol/L (ref 1.12–1.32)
Chloride: 101 mEq/L (ref 96–112)
Creatinine, Ser: 1 mg/dL (ref 0.4–1.5)
Glucose, Bld: 89 mg/dL (ref 70–99)
HCT: 50 % (ref 39.0–52.0)
Hemoglobin: 17 g/dL (ref 13.0–17.0)
Potassium: 4.5 mEq/L (ref 3.5–5.1)
Sodium: 139 mEq/L (ref 135–145)
TCO2: 28 mmol/L (ref 0–100)

## 2010-07-06 LAB — COMPREHENSIVE METABOLIC PANEL
ALT: 19 U/L (ref 0–53)
AST: 30 U/L (ref 0–37)
Albumin: 3.9 g/dL (ref 3.5–5.2)
Alkaline Phosphatase: 50 U/L (ref 39–117)
BUN: 12 mg/dL (ref 6–23)
CO2: 22 mEq/L (ref 19–32)
Calcium: 9.2 mg/dL (ref 8.4–10.5)
Chloride: 96 mEq/L (ref 96–112)
Creatinine, Ser: 1.01 mg/dL (ref 0.4–1.5)
GFR calc Af Amer: >60 mL/min (ref >60)
GFR calc non Af Amer: >60 mL/min (ref >60)
Glucose, Bld: 115 mg/dL — ABNORMAL HIGH (ref 70–99)
Potassium: 4.2 mEq/L (ref 3.5–5.1)
Sodium: 134 mEq/L — ABNORMAL LOW (ref 135–145)
Total Bilirubin: 1.3 mg/dL — ABNORMAL HIGH (ref 0.3–1.2)
Total Protein: 6.5 g/dL (ref 6.0–8.3)

## 2010-07-06 LAB — CBC
HCT: 39.1 % (ref 39.0–52.0)
HCT: 39.2 % (ref 39.0–52.0)
HCT: 44 % (ref 39.0–52.0)
Hemoglobin: 12.3 g/dL — ABNORMAL LOW (ref 13.0–17.0)
Hemoglobin: 13.8 g/dL (ref 13.0–17.0)
Hemoglobin: 13.6 g/dL (ref 13.0–17.0)
MCH: 26.2 pg (ref 26.0–34.0)
MCH: 26.1 pg (ref 26.0–34.0)
MCHC: 31.5 g/dL (ref 30.0–36.0)
MCHC: 35.2 g/dL (ref 30.0–36.0)
MCHC: 30.9 g/dL (ref 30.0–36.0)
MCV: 83.2 fL (ref 78.0–100.0)
MCV: 98.6 fL (ref 78.0–100.0)
MCV: 84.3 fL (ref 78.0–100.0)
Platelets: 271 10*3/uL (ref 150–400)
Platelets: 297 10*3/uL (ref 150–400)
Platelets: 310 10*3/uL (ref 150–400)
RBC: 4.7 MIL/uL (ref 4.22–5.81)
RBC: 3.97 MIL/uL — ABNORMAL LOW (ref 4.22–5.81)
RBC: 5.22 MIL/uL (ref 4.22–5.81)
RDW: 19.5 % — ABNORMAL HIGH (ref 11.5–15.5)
RDW: 13 % (ref 11.5–15.5)
RDW: 19.9 % — ABNORMAL HIGH (ref 11.5–15.5)
WBC: 9.9 10*3/uL (ref 4.0–10.5)
WBC: 7.3 10*3/uL (ref 4.0–10.5)
WBC: 13.9 10*3/uL — ABNORMAL HIGH (ref 4.0–10.5)

## 2010-07-06 LAB — MAGNESIUM
Magnesium: 2.6 mg/dL — ABNORMAL HIGH (ref 1.5–2.5)
Magnesium: 2.5 mg/dL (ref 1.5–2.5)

## 2010-07-06 LAB — BASIC METABOLIC PANEL
BUN: 14 mg/dL (ref 6–23)
CO2: 27 mEq/L (ref 19–32)
Calcium: 8.3 mg/dL — ABNORMAL LOW (ref 8.4–10.5)
Chloride: 109 mEq/L (ref 96–112)
Creatinine, Ser: 0.97 mg/dL (ref 0.4–1.5)
GFR calc Af Amer: >60 mL/min (ref >60)
GFR calc non Af Amer: >60 mL/min (ref >60)
Glucose, Bld: 107 mg/dL — ABNORMAL HIGH (ref 70–99)
Potassium: 4.7 mEq/L (ref 3.5–5.1)
Sodium: 140 mEq/L (ref 135–145)

## 2010-07-06 LAB — GLUCOSE, CAPILLARY
Glucose-Capillary: 104 mg/dL — ABNORMAL HIGH (ref 70–99)
Glucose-Capillary: 114 mg/dL — ABNORMAL HIGH (ref 70–99)
Glucose-Capillary: 115 mg/dL — ABNORMAL HIGH (ref 70–99)
Glucose-Capillary: 119 mg/dL — ABNORMAL HIGH (ref 70–99)
Glucose-Capillary: 192 mg/dL — ABNORMAL HIGH (ref 70–99)
Glucose-Capillary: 87 mg/dL (ref 70–99)
Glucose-Capillary: 91 mg/dL (ref 70–99)

## 2010-07-06 LAB — CREATININE, SERUM
Creatinine, Ser: 1.01 mg/dL (ref 0.4–1.5)
GFR calc Af Amer: >60 mL/min (ref >60)
GFR calc non Af Amer: >60 mL/min (ref >60)

## 2010-07-06 LAB — POTASSIUM: Potassium: 4.6 mEq/L (ref 3.5–5.1)

## 2010-07-07 ENCOUNTER — Inpatient Hospital Stay (HOSPITAL_COMMUNITY): Payer: Medicaid Other

## 2010-07-07 ENCOUNTER — Encounter (HOSPITAL_BASED_OUTPATIENT_CLINIC_OR_DEPARTMENT_OTHER): Payer: Self-pay

## 2010-07-07 DIAGNOSIS — I311 Chronic constrictive pericarditis: Secondary | ICD-10-CM

## 2010-07-07 LAB — GLUCOSE, CAPILLARY
Glucose-Capillary: 107 mg/dL — ABNORMAL HIGH (ref 70–99)
Glucose-Capillary: 112 mg/dL — ABNORMAL HIGH (ref 70–99)
Glucose-Capillary: 114 mg/dL — ABNORMAL HIGH (ref 70–99)
Glucose-Capillary: 132 mg/dL — ABNORMAL HIGH (ref 70–99)
Glucose-Capillary: 138 mg/dL — ABNORMAL HIGH (ref 70–99)
Glucose-Capillary: 140 mg/dL — ABNORMAL HIGH (ref 70–99)
Glucose-Capillary: 98 mg/dL (ref 70–99)

## 2010-07-07 LAB — CBC
HCT: 41.8 % (ref 39.0–52.0)
Hemoglobin: 13 g/dL (ref 13.0–17.0)
MCH: 25.9 pg — ABNORMAL LOW (ref 26.0–34.0)
MCHC: 31.1 g/dL (ref 30.0–36.0)
MCV: 83.4 fL (ref 78.0–100.0)
Platelets: 295 10*3/uL (ref 150–400)
RBC: 5.01 MIL/uL (ref 4.22–5.81)
RDW: 20 % — ABNORMAL HIGH (ref 11.5–15.5)
WBC: 13.8 10*3/uL — ABNORMAL HIGH (ref 4.0–10.5)

## 2010-07-07 LAB — BASIC METABOLIC PANEL
BUN: 17 mg/dL (ref 6–23)
CO2: 29 mEq/L (ref 19–32)
Calcium: 8.3 mg/dL — ABNORMAL LOW (ref 8.4–10.5)
Chloride: 100 mEq/L (ref 96–112)
Creatinine, Ser: 0.93 mg/dL (ref 0.4–1.5)
GFR calc Af Amer: >60 mL/min (ref >60)
GFR calc non Af Amer: >60 mL/min (ref >60)
Glucose, Bld: 94 mg/dL (ref 70–99)
Potassium: 4.4 mEq/L (ref 3.5–5.1)
Sodium: 135 mEq/L (ref 135–145)

## 2010-07-07 LAB — TISSUE CULTURE: Culture: NO GROWTH

## 2010-07-08 ENCOUNTER — Inpatient Hospital Stay (HOSPITAL_COMMUNITY): Payer: Medicaid Other

## 2010-07-08 LAB — BODY FLUID CULTURE
Culture: NO GROWTH
Gram Stain: NONE SEEN

## 2010-07-08 LAB — COMPREHENSIVE METABOLIC PANEL
ALT: 16 U/L (ref 0–53)
AST: 40 U/L — ABNORMAL HIGH (ref 0–37)
Albumin: 2.3 g/dL — ABNORMAL LOW (ref 3.5–5.2)
Alkaline Phosphatase: 73 U/L (ref 39–117)
BUN: 19 mg/dL (ref 6–23)
CO2: 28 mEq/L (ref 19–32)
Calcium: 8 mg/dL — ABNORMAL LOW (ref 8.4–10.5)
Chloride: 97 mEq/L (ref 96–112)
Creatinine, Ser: 0.95 mg/dL (ref 0.4–1.5)
GFR calc Af Amer: >60 mL/min (ref >60)
GFR calc non Af Amer: >60 mL/min (ref >60)
Glucose, Bld: 86 mg/dL (ref 70–99)
Potassium: 4.2 mEq/L (ref 3.5–5.1)
Sodium: 132 mEq/L — ABNORMAL LOW (ref 135–145)
Total Bilirubin: 3.5 mg/dL — ABNORMAL HIGH (ref 0.3–1.2)
Total Protein: 5.5 g/dL — ABNORMAL LOW (ref 6.0–8.3)

## 2010-07-08 LAB — CBC
HCT: 40.4 % (ref 39.0–52.0)
Hemoglobin: 12.5 g/dL — ABNORMAL LOW (ref 13.0–17.0)
MCH: 25.6 pg — ABNORMAL LOW (ref 26.0–34.0)
MCHC: 30.9 g/dL (ref 30.0–36.0)
MCV: 82.8 fL (ref 78.0–100.0)
Platelets: 293 10*3/uL (ref 150–400)
RBC: 4.88 MIL/uL (ref 4.22–5.81)
RDW: 19.8 % — ABNORMAL HIGH (ref 11.5–15.5)
WBC: 10.1 10*3/uL (ref 4.0–10.5)

## 2010-07-08 LAB — GLUCOSE, CAPILLARY
Glucose-Capillary: 85 mg/dL (ref 70–99)
Glucose-Capillary: 87 mg/dL (ref 70–99)
Glucose-Capillary: 100 mg/dL — ABNORMAL HIGH (ref 70–99)
Glucose-Capillary: 102 mg/dL — ABNORMAL HIGH (ref 70–99)
Glucose-Capillary: 93 mg/dL (ref 70–99)

## 2010-07-08 LAB — TISSUE CULTURE
Culture: NO GROWTH
Gram Stain: NONE SEEN

## 2010-07-09 ENCOUNTER — Inpatient Hospital Stay (HOSPITAL_COMMUNITY): Payer: Medicaid Other

## 2010-07-09 ENCOUNTER — Telehealth: Payer: Self-pay | Admitting: Pulmonary Disease

## 2010-07-09 DIAGNOSIS — G4733 Obstructive sleep apnea (adult) (pediatric): Secondary | ICD-10-CM

## 2010-07-09 LAB — CBC
HCT: 42.2 % (ref 39.0–52.0)
Hemoglobin: 13 g/dL (ref 13.0–17.0)
MCH: 26 pg (ref 26.0–34.0)
MCHC: 30.8 g/dL (ref 30.0–36.0)
MCV: 84.4 fL (ref 78.0–100.0)
Platelets: 318 10*3/uL (ref 150–400)
RBC: 5 MIL/uL (ref 4.22–5.81)
RDW: 20.1 % — ABNORMAL HIGH (ref 11.5–15.5)
WBC: 9.1 10*3/uL (ref 4.0–10.5)

## 2010-07-09 LAB — GLUCOSE, CAPILLARY
Glucose-Capillary: 103 mg/dL — ABNORMAL HIGH (ref 70–99)
Glucose-Capillary: 114 mg/dL — ABNORMAL HIGH (ref 70–99)
Glucose-Capillary: 96 mg/dL (ref 70–99)
Glucose-Capillary: 107 mg/dL — ABNORMAL HIGH (ref 70–99)

## 2010-07-09 LAB — BASIC METABOLIC PANEL
BUN: 14 mg/dL (ref 6–23)
CO2: 30 mEq/L (ref 19–32)
Calcium: 8.4 mg/dL (ref 8.4–10.5)
Chloride: 97 mEq/L (ref 96–112)
Creatinine, Ser: 0.98 mg/dL (ref 0.4–1.5)
GFR calc Af Amer: >60 mL/min (ref >60)
GFR calc non Af Amer: >60 mL/min (ref >60)
Glucose, Bld: 78 mg/dL (ref 70–99)
Potassium: 3.7 mEq/L (ref 3.5–5.1)
Sodium: 133 mEq/L — ABNORMAL LOW (ref 135–145)

## 2010-07-09 NOTE — Telephone Encounter (Signed)
Please have them order s9 escape/auto with h/h and set on 10cm of pressure.  He needs to see me in 5 weeks

## 2010-07-09 NOTE — H&P (Signed)
NAME:  Harold, [name] NO.:  [id number]  MEDICAL RECORD NO.:  [id number]           PATIENT TYPE:  I  LOCATION:  2003                         FACILITY:  [facility]  PHYSICIAN:  Harold Abed, MD, FACCDATE OF BIRTH:  1965-05-30  DATE OF ADMISSION:  07/01/2010 DATE OF DISCHARGE:                             HISTORY & PHYSICAL   PRIMARY CARDIOLOGIST:  Harold Rotunda, MD, [facility]  PRIMARY CARE PHYSICIAN:  Harold [name]. Tawanna Cooler, MD  PRIMARY PULMONOLOGIST:  Harold Share, MD,FCCP  CHIEF COMPLAINT:  Shortness of breath.  HISTORY OF PRESENT ILLNESS:  [name] is a 45 year old African American male with a known history of constrictive pericarditis with significant volume overload discharged on June 18, 2010, after diuresing with 80 mg of IV Lasix b.i.d., decrease in weight from 109.3 kg to 101.2 kg on discharge.  The patient had a right and left heart catheterization at that time as well as a 2-D echocardiogram, both of which were consistent with constrictive pericarditis.  The patient was diagnosed with acute pericarditis back in January 2012.  The patient also has history significant for hypertriglyceridemia, obesity, hypertension, presumed OSA (had sleep study 2 days ago, results pending), GERD, and minimal but ongoing tobacco abuse who now presents with worsening shortness of breath, orthopnea, PND, 2 episodes of vomiting separated by 1 week, and increasing cough over the last 2 weeks.  The patient was in his usual state of health and felt much better after leaving the hospital on June 18, 2010.  He reports excellent medical compliance except for discontinuing his statin therapy due to "hand cramps."  He denies any dietary indiscretions.  Unfortunately, he has had a progressive increase in shortness of breath, orthopnea, lower extremity swelling, and abdominal tightness over the last week and a half to two weeks.  He has also noted significant increasing in  cough, especially over the last 24 hours.  One week ago, he had an episode of nausea with 2 episodes of vomiting.  He was okay for a week with these symptoms, but then today he was coughing vigorously and had another episode of vomiting at this time.  He felt it is mostly phlegm that he got up.  The patient has some chest tightness, but no significant chest pain outside associated with his worsening shortness of breath.  He denies fevers, chills, palpitations, pre/syncope, wheezing, bleeding, changes in bowel or bladder, or any other complaints.  It was felt noteworthy that the patient has chronic back pain continues to bother him.  He also complains of right testicular pain.  When he stands, he feels like this pain is worsened and he has a fullness and actually can feel that there seems to be swelling in his scrotum simply from standing, more so on the right side without any discomfort in the left side.  His symptoms this morning were significantly worse as he has had such poor sleep overnight due to his shortness of breath/orthopnea/PND/coughing.  He finally came to the emergency department for further evaluation.  Here he is mildly hypertensive, otherwise vital signs within normal limits and stable.  Chest x-ray shows  persistent moderate right pleural effusion.  Labs notable for BNP of 433, but prior values between 594 and 736 from last admission.  CBC and CMET unremarkable except for minimally elevated AST of 69.  PAST MEDICAL HISTORY: 1. Constrictive pericarditis.     a.     Discharged June 18, 2010, on Lasix 80 mg daily and at 101.2      kg down from 109.3 kg on admission.     b.     2-D echocardiogram and left and right heart catheterization      at admission consistent with constrictive pericarditis. 2. History of acute pericarditis on March 29, 2010. 3. Hypertriglyceridemia. 4. Obesity. 5. Hypertension. 6. Obstructive sleep apnea (suspected, sleep study completed June 29, 2010, results pending). 7. Depression/anxiety. 8. GERD. 9. Ongoing tobacco abuse (only 3 cigarettes per day). 10.Chronic back pain. 11.History of pancreatitis (question secondary to     hypertriglyceridemia). 12.History of colonic polyps. 13.S/P tonsillectomy and adenoidectomy.  SOCIAL HISTORY:  The patient lives in [place] with his parents.  He is on disability.  The patient has been smoking for 25 years, but only smokes 3 cigarettes per day for the last year or so.  No EtOH since March 21, 2010.  No illicit drug use history.  No herbal meds. Generally follows low-sodium diet.  No regular exercise.  FAMILY HISTORY:  Negative for premature diagnosis of coronary artery disease.  REVIEW OF SYSTEMS:  Please see HPI.  All other systems were reviewed and were negative.  CODE STATUS:  Full.  ALLERGIES AND INTOLERANCES: 1. DIPHENHYDRAMINE. 2. PORCINE. 3. ZOLPIDEM.  MEDICATIONS: 1. Lisinopril 20 mg p.o. b.i.d. 2. Potassium chloride 20 mEq p.o. daily. 3. Lasix 80 mg p.o. daily. 4. Bystolic 5 mg p.o. daily. 5. Citalopram 20 mg p.o. daily. 6. Crestor 20 mg p.o. at bedtime (the patient discontinued secondary     to hand cramps). 7. Fenofibrate 145 mg p.o. daily. 8. Hydrocodone/acetaminophen 7.5/750 mg p.o. q.6 h. 9. Multivitamin daily. 10.Vitamin B12 daily. 11.Zantac 150 mg p.o. daily. 12.Zyrtec 10 mg one-half tablet p.o. daily.  In the emergency department, the patient was given Lasix 80 mg IV x1.  PHYSICAL EXAMINATION:  VITAL SIGNS:  Temperature 97.6 degrees Fahrenheit with BP 143/105, pulse 70, respiration rate 20, O2 saturation 99% on room air. GENERAL:  The patient is alert and oriented x3 in no apparent distress. He is able to speak easily in full sentences without respiratory distress (wearing 2 L nasal cannula currently). HEENT:  Head is normocephalic, atraumatic.  Pupils equal, round, and reactive to light.  Extraocular muscles are intact.  Nares are  patent without discharge.  Oropharynx without erythema or exudates. NECK:  Supple without lymphadenopathy.  No thyromegaly, JVD approximately at the earlobe bilaterally. HEART:  Rate is regular with audible S1 and S2.  No clicks, rubs, murmurs, or gallops.  Pulses are 2+ and equal in both upper and lower extremities bilaterally. LUNGS:  Decreased breath sounds at the right base. SKIN:  No rashes, lesions, or petechiae. ABDOMEN:  Firm, nontender, mild-to-moderate distention.  Normal abdominal bowel sounds.  No rebound or guarding.  No obvious splenomegaly.  The patient is obese. EXTREMITIES:  With no clubbing or cyanosis, difficult to assess edema as the patient is wearing thigh-high stockings, but at least 1+ bilaterally. NEURO:  Cranial nerves II through XII grossly intact.  Strength 5/5 in all extremities and axial groups.  Normal sensation throughout and normal cerebellar function.  RADIOLOGY:  Chest  x-ray showed persistent moderate right pleural effusion with dense right basilar opacity.  Probable plate-like atelectomy versus atelectasis at the left base.  EKG:  Pending.  LABORATORY DATA:  WBC is 8.3, HGB 13.8, HCT 44.7, PLT count is 366. Sodium 135, potassium 4.6, chloride 101, bicarb 25, BUN 16, creatinine 1.13, glucose 141, total bilirubin 3.3, alkaline phosphatase 96, AST 69, ALT 22, albumin 3.1.  BNP 433.  Protime 17.7, INR 1.44, calcium 8.6.  ASSESSMENT/PLAN:  The patient initially seen by Lenard Galloway, PA-C and then seen and thoroughly assessed by attending cardiologist, Dr. Willa Rough.  Mr. Brau is a 45 year old African American male with a known history of constrictive pericarditis requiring admission and IV diuresis with Lasix 80 mg IV b.i.d. with decrease in weight of 101.2 kg discharged on June 18, 2010.  Also history significant for hypertriglyceridemia, pericarditis thought to possibly related to elevated triglycerides, obesity, hypertension, and highly  suspected to have obstructive sleep apnea who now presents with acute on chronic diastolic CHF (constrictive physiology), persistent and moderate right pleural effusion, question of GI illness and/or right inguinal hernia in the setting of question of gelness and/or right inguinal hernia.  The patient known to have good LV time, he is currently volume overloaded, there is a question of constrictive physiology and obstructive sleep apnea.  We will admit the patient, diurese, talk to his primary cardiologist, question role of pericardectomy in this case.  If right pleural effusion does not resolve/improve this admission, we will discuss the risks and benefits of thoracocentesis (diagnostic/therapeutic).  We will check lipase and follow renal function, electrolytes, telemetry, and symptoms closely.  The patient will have low-sodium diet, daily weights, and nutrition consult.     Jarrett Ables, PAC   ______________________________ Harold Abed, MD, [facility]    MS/MEDQ  D:  07/01/2010  T:  07/02/2010  Job:  [id number]  Electronically Signed by Jarrett Ables PAC on 07/06/2010 11:22:12 AM Electronically Signed by Willa Rough MD FACC on 07/09/2010 02:48:08 PM

## 2010-07-09 NOTE — Telephone Encounter (Signed)
Raynelle Fanning, case mgr on 2000 calling to inquire about starting the patient on cpap. He has been on cpap while in the hospital and says that he feels so much better. Raynelle Fanning, will fax sleep study over for [facility] to review but will go ahead and have cardiology order cpap for home through [facility]. KC can call them with cpap settings. Sleep study given to [facility].

## 2010-07-10 LAB — GLUCOSE, CAPILLARY: Glucose-Capillary: 77 mg/dL (ref 70–99)

## 2010-07-10 LAB — ANAEROBIC CULTURE: Gram Stain: NONE SEEN

## 2010-07-12 NOTE — Telephone Encounter (Signed)
LMOM with Raynelle Fanning, case mgr at [facility] to inform her of KC's recs.

## 2010-07-13 DIAGNOSIS — G4733 Obstructive sleep apnea (adult) (pediatric): Secondary | ICD-10-CM

## 2010-07-14 ENCOUNTER — Telehealth: Payer: Self-pay | Admitting: Pulmonary Disease

## 2010-07-14 NOTE — Telephone Encounter (Signed)
Called and spoke with pt.  Pt scheduled to see KC on Monday 07-19-2010 at 2:30 pm.

## 2010-07-14 NOTE — Procedures (Signed)
NAME:  Harold [name], Harold [name] NO.:  [id number]  MEDICAL RECORD NO.:  [id number]          PATIENT TYPE:  OUT  LOCATION:  SLEEP CENTER                 FACILITY:  [facility]  PHYSICIAN:  Barbaraann Share, MD,FCCPDATE OF BIRTH:  02[birth year]08[birth year]1968  DATE OF STUDY:  07/01/2010                           NOCTURNAL POLYSOMNOGRAM  REFERRING PHYSICIAN:  Moody Robben M Asar Evilsizer  INDICATION FOR STUDY:  Hypersomnia with sleep apnea.  EPWORTH SLEEPINESS SCORE:  14.  MEDICATIONS:  SLEEP ARCHITECTURE:  The patient had a total sleep time of 182 minutes with no slow wave sleep and only 3.5 minutes of REM.  Sleep onset latency was prolonged at 75 minutes, and REM onset was prolonged at 198 minutes.  Sleep efficiency was poor at 50%.  RESPIRATORY DATA:  The patient was found to have 107 central apneas, 117 obstructive apneas, and 11 obstructive hypopneas, giving him an apnea- hypopnea index of 78 events per hour.  The events occurred in all body positions and there was moderate snoring noted throughout.  OXYGEN DATA:  There was O2 desaturation as low as 69% with the patient's obstructive events.  CARDIAC DATA:  Rare PAC noted, but no clinically significant arrhythmias were seen.  MOVEMENT-PARASOMNIA:  The patient had no significant leg jerks or other abnormal behaviors seen.  IMPRESSIONS-RECOMMENDATIONS: 1. Severe obstructive and central sleep apnea with an AHI of 78 events     per hour and O2 desaturation as low as 69%.  Treatment for this     degree of sleep apnea should focus primarily on a positive pressure     device such as CPAP or bilevel, as well as weight loss.  Given the     patient's complex apnea, he will more than likely need a return to     the sleep center for a formal CPAP titration in order to prevent     worsening of his apnea due to     pressure-induced central apneas in this setting. 2. Rare PAC noted, but no clinically significant arrhythmia was seen.     Barbaraann Share,  MD,FCCP Diplomate, American Board of Sleep Medicine Electronically Signed    [facility]/MEDQ  D:  07/13/2010 19:19:47  T:  07/14/2010 06:04:18  Job:  [id number]

## 2010-07-15 ENCOUNTER — Telehealth: Payer: Self-pay | Admitting: Cardiology

## 2010-07-15 HISTORY — PX: OTHER SURGICAL HISTORY: SHX169

## 2010-07-15 NOTE — Telephone Encounter (Signed)
Yes I do. 

## 2010-07-15 NOTE — Telephone Encounter (Signed)
Called to sch follow-up after sending order to [facility] . He states he already has cpap machine through [facility]. It's a Resmed machine. PCC's will call [facility] to find out exactly what type of machine he has and pressure setting. Pt is scheduled for f/u on Mon., 08/09/2010 w/ KC.

## 2010-07-15 NOTE — Telephone Encounter (Signed)
Spoke w/ Raynelle Fanning and she states pt has already be released and can no longer do anything about pt being on cpap bc he has been released.  Spoke w/ [facility] and they states they received order from Dr. Nelida Meuse office for pt to be on cpap.   Please advise Dr. Shelle Iron if you still want [place] to send order for pt s9 escape/auto w/ h/h and set on 10 cm of pressure and then him to see you in 5 weeks.  Thanks  Carver Fila, CMA

## 2010-07-15 NOTE — Telephone Encounter (Signed)
Spoke with Harold [name]'s brother who reports Harold [name] started experiencing severe right side pain and left sided chest pain last evening which doubled him up. Also, has shortness of breath.  Brother states Harold [name]'s pain is near where he has a drain in.  Harold [name] released from hospital on Saturday following median sternotomy with pericardectomy.  Brother reports Harold [name] with 10/10 pain. Took pain medicine with relief only lasting 20 minutes.  I told brother Harold [name] should go to [facility] to be evaluated by ER doctor. Brother agrees with plan and will transport Harold [name] to hospital.  He is aware to call EMS if needed.

## 2010-07-19 ENCOUNTER — Ambulatory Visit: Payer: Self-pay | Admitting: Pulmonary Disease

## 2010-07-19 NOTE — Telephone Encounter (Signed)
Checked with [facility] and they D/C pt on a ResMed s9 Escape/Auto set a 8 cm with o2 bled in.

## 2010-07-19 NOTE — Consult Note (Signed)
NAME:  Harold [name], Harold [name] NO.:  [id number]  MEDICAL RECORD NO.:  [id number]           PATIENT TYPE:  I  LOCATION:  2003                         FACILITY:  [facility]  PHYSICIAN:  Sheliah Plane, MD    DATE OF BIRTH:  11-10-1965  DATE OF CONSULTATION: DATE OF DISCHARGE:                                CONSULTATION   REQUESTING PHYSICIAN:  Rollene Rotunda, MD, [facility]  FOLLOWUP CARDIOLOGIST:  Rollene Rotunda, MD, [facility]  PRIMARY CARE PHYSICIAN:  Eugenio Hoes. Tawanna Cooler, MD  REASON FOR CONSULTATION:  Question of constrictive pericarditis.  HISTORY OF PRESENT ILLNESS:  The patient is a 45 year old male who has had a history of pericarditis, admitted in December 2011.  At that time, no invasive procedures were carried out, but since the patient has been seen frequently for increasing heart failure especially right heart failure with anasarca.  In spite of aggressive medical therapy, he was admitted on June 10, 2010, at that time right heart catheterization was performed suggesting constrictive pericarditis.  The patient was again discharged home and now returns back in spite of diuretic therapy and what appears to be compliance with his treatment regime with increasing right pleural effusion and anasarca.  He has a history of hypertension, hyperlipidemia.  Denies diabetes.  He is a smoker and continues to smoke and has been for 20 years.  Denies stroke.  Denies claudication.  Denies renal insufficiency.  Has had a history of pancreatitis in February 2011.  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  Previous history include right knee arthroscopy of both shoulders and left elbow, tonsillectomy.  He had a vocal cord polyp removed with suggestion that could be lymphoma, but the path was all negative.  He had a colon polyp removed during colonoscopy by Dr. 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  His colonoscopy was performed  because the patient's concern his father died at age 53 with colon cancer.  SOCIAL HISTORY:  The patient is divorced, has 45-year-old son.  He is currently disabled.  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 He notes the in December 2011, he quit drinking alcohol.  CURRENT MEDICATIONS: 1. Lisinopril 20 mg a day. 2. KCl 20 mEq a day. 3. Lasix 80 mg twice a day. 4. Bystolic 5 mg a day. 5. Citalopram 20 mg a day. 6. Crestor 20 mg a day. 7. Fenofibrate 145 mg a day. 8. Vicodin 7.5/750 q.6 h. 9. Vitamin D. 10.Zantac. 11.Zyrtec.  DRUG ALLERGIES: 1. COLCHICINE. 2. ZYLOPRIM. 3. DIPHENHYDRAMINE.  REVIEW OF SYSTEMS:  CARDIAC:  Negative for chest pain.  He had resting shortness of breath, exertional shortness of breath, presyncope, orthopnea, severe lower extremity edema.  Denies palpitations or syncope.  GENERAL:  The patient's weight has fluctuate significantly. When he had severe anasarca, his weight had been up as high as 262 pounds and with aggressive treatment had been as low as 212 pounds.  He recently had a sleep apnea tests and is followed by Dr. Shelle Iron.  During his cath catheterization, he had a right heart cath, he  had episodes of apnea with hypoxia.  He denies any blood in his stool.  Has musculoskeletal complaints including chronic low back pain, which he takes chronic narcotics.  He has had no history of tuberculosis exposure.  FAMILY HISTORY:  The patient's father died in his 23s who has had 3 or 4 strokes and had colon cancer at age 68, and died related to strokes in his 79s.  His mother is alive.  She does have diabetes.  He has two sisters, one has "mold-related lung disease.".  PHYSICAL EXAMINATION:  GENERAL:  The patient appears older than his stated age of 66 years. VITAL SIGNS:  O2 sats 99% on room air.  He is afebrile.  Blood pressure is 140/80. NECK:  He has no carotid bruits.  He has marked decreased breath sounds of the lower right  chest. CARDIAC:  Has muffled, but I do not hear any murmur of mitral insufficiency. LUNGS:  The left lung fields are clear. ABDOMEN:  Exam reveals a palpable edge of his liver three to four fingerbreadths below the costal margin. Extremities:  He has significant pitting edema in his thighs and calves.  LABORATORY FINDINGS:  Sodium 136, potassium 3.7, creatinine is 1.0, AST 42, alk phos 101, total bilirubin 2.6, ALT 21, albumin 3.0.  BNP 433. Protime 17.7, INR 1.44.  CT scan of the chest was ordered and reviewed with Radiology.  The patient has a thin distinctive fat pad around the heart.  The pericardium appears to be thickened to at least 10 mm.  It is unclear if this is actually thickened pericardium or a very uniform pericardial effusion, but on review of the whole scan, my oppression is that it is thickened pericardium at least 10 mm in thickness.  The patient had a right heart cath with simultaneous left and right heart pressures done in end of March consistent with constrictive pericarditis.  IMPRESSION: 1. Acute on chronic right heart failure with anasarca and large right     pleural effusion. 2. History of alcohol abuse with pancreatitis and probable some degree     of cirrhosis.  SUGGESTIONS:  With the patient's current clinical course, I agree with Dr. Antoine Poche that pericardectomy would be indicated as the patient's aggressive medical therapy for this situation is failed as the likelihood in his true constrictive pericarditis.  I have discussed these findings with the patient with a known passive congestion of liver superimposed and abnormal liver, we will give some vitamin K at this point and consider early in this hospitalization proceeding with sternotomy, pericardectomy with pump standby and also drainage of right pleural effusion with placement of a PleurX catheter.     Sheliah Plane, MD     EG/MEDQ  D:  07/03/2010  T:  07/03/2010  Job:  [id number]  cc:    Rollene Rotunda, MD, [facility]. Tawanna Cooler, MD  Electronically Signed by Sheliah Plane MD on 07/19/2010 11:04:03 AM

## 2010-07-19 NOTE — Discharge Summary (Signed)
NAME:  Harold [name], Harold [name] NO.:  [id number]  MEDICAL RECORD NO.:  [id number]           PATIENT TYPE:  I  LOCATION:  2016                         FACILITY:  [facility]  PHYSICIAN:  Sheliah Plane, MD    DATE OF BIRTH:  Feb 14, 1966  DATE OF ADMISSION:  07/01/2010 DATE OF DISCHARGE:  07/10/2010                              DISCHARGE SUMMARY   PRIMARY ADMITTING DIAGNOSIS:  Shortness of breath.  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 3. Hypertension. 4. Hyperlipidemia. 5. Ongoing tobacco abuse. 6. History of pancreatitis in February 2011. 7. History of vocal cord polyps. 8. History of colon polyps. 9. Prior history of alcohol use. 10.Presumed obstructive sleep apnea. 11.History of hypertriglyceridemia. 12.Gastroesophageal reflux disease.  PROCEDURES PERFORMED: 1. Median sternotomy with pericardectomy. 2. Placement of right Pleurx catheter.  HISTORY OF PRESENT ILLNESS:  The patient is a 45 year old male who was recently been admitted on several occasions since December 2011 with pericarditis.  He was most recently admitted in March 2012 with some significant volume overload which was felt to be secondary to constrictive pericarditis.  He was treated with aggressive diuresis and subsequently was discharged home on June 08, 2011, in improved condition.  Since that time, he has had progressive shortness of breath, orthopnea, lower extremity edema, and anasarca.  He ultimately presented to the emergency department on the day of admission for further evaluation.  He was noted to be mildly hypertensive with a persistent moderate right pleural effusion in the ER.  He was seen by Cardiology and was subsequently admitted for further evaluation and treatment.  HOSPITAL COURSE:  Harold [name] was admitted and was started on diuresis. His records from his previous admission were reviewed and he  had undergone cardiac catheterization which suggested constrictive pericarditis.  In light of his progressive symptoms, cardiac surgery consult was obtained for consideration of pericardectomy.  Dr. Tyrone Sage saw the patient and reviewed his films and agreed that this indicated as the patient had failed medical therapy.  A CT of the chest was performed and did show thickening of the pericardium, at least 10 mm.  Review of his right heart cath did confirm of a constrictive pericarditis picture. Dr. Tyrone Sage explained all risks, benefits, and alternatives of the surgery to the patient and he agreed to proceed.  Also, it was felt that Pleurx catheter should be placed to drain this recurrent right pleural effusion.  The patient was taken to the operating room on July 05, 2010, and underwent a median sternotomy with pericardectomy and placement of a right Pleurx catheter.  He tolerated the procedure well and was transferred to the SICU in stable condition.  Intraoperatively, it was noted that he had significant constrictive pericarditis and approximately 600 mL of yellowish fluid was drained from the right pleural space.  The patient was extubated shortly after surgery and was hemodynamically stable and doing well on postop day #1.  He did remain in the unit for close observation.  By postop day #3, he was ready for transfer to the Step-Down Unit.  His postoperative course has generally been uneventful.  His chest tubes were removed by postop day #2 and his Pleurx catheter was drained on postop day #4 yielding approximately 250 mL of serous-appearing fluid.  His pulmonary status has remained stable and he has been afebrile.  He has not been significantly volume overloaded postoperatively and has not required diuresis.  His incisions are all healing well.  He is tolerating a regular diet and is ambulating in the halls without problem.  He did have some mild elevation of his LFTs on postop day  #2, but on followup this had begun to trend back down towards baseline.  His most recent LFT panel showed a total bilirubin of 3.5 and alkaline phosphatase of 73.  His labs on postop day #4 showed hemoglobin of 13, hematocrit 42.2, white count 9.1, platelets 318, sodium 133, potassium 3.7, BUN 14, creatinine 0.98.  His most recent chest x-ray is stable.  He appears to be progressing as expected and we anticipate discharge home within the next 24-48 hours providing he remain stable.  DISCHARGE MEDICATIONS: 1. Metoprolol 12.5 mg b.i.d. 2. Oxycodone IR 5-10 mg q.3-4 h. p.r.n. for pain. 3. Celexa 20 mg daily. 4. Crestor 10 mg daily. 5. Fenofibrate 145 mg daily. 6. Multivitamin daily. 7. Vitamin B12 three times weekly. 8. Zantac 150 mg daily. 9. Zyrtec 5 mg daily.  DISCHARGE INSTRUCTIONS:  He is asked to refrain from driving, heavy lifting, or strenuous activity.  He may continue ambulating daily and using his incentive spirometer.  He may shower daily and clean his incisions with soap and water.  He will continue a low-fat, low-sodium diet.  DISCHARGE FOLLOWUP:  A home health nurse has been arranged to continuedrainage of his right Pleurx catheter.  He will need to follow up with Dr. Antoine Poche in 2 weeks.  He will then see Dr. Tyrone Sage in 3 weeks with a chest x-ray.  In the interim, our office will be contacted if experiences any problems or has questions.     Coral Ceo, P.A.   ______________________________ Sheliah Plane, MD    GC/MEDQ  D:  07/09/2010  T:  07/10/2010  Job:  [id number]  cc:   Rollene Rotunda, MD, [facility] Tinnie Gens A. Tawanna Cooler, MD [facility] Office  Electronically Signed by Coral Ceo P.A. on 07/16/2010 02:24:09 PM Electronically Signed by Sheliah Plane MD on 07/19/2010 11:04:41 AM

## 2010-07-19 NOTE — Op Note (Signed)
NAME:  Harold [name], Harold [name] NO.:  [id number]  MEDICAL RECORD NO.:  [id number]           PATIENT TYPE:  I  LOCATION:  2304                         FACILITY:  [facility]  PHYSICIAN:  Sheliah Plane, MD    DATE OF BIRTH:  1965/06/23  DATE OF PROCEDURE:  07/05/2010 DATE OF DISCHARGE:                              OPERATIVE REPORT   PREOPERATIVE DIAGNOSIS:  Constrictive pericarditis with acute and chronic systolic and diastolic heart failure.  POSTOPERATIVE DIAGNOSIS:  Constrictive pericarditis with acute and chronic systolic and diastolic heart failure.  SURGICAL PROCEDURES:  Pericardectomy and relief of constrictive pericarditis by a sternotomy and placement of right thorax catheter.  SURGEON:  Sheliah Plane, MD.  FIRST ASSISTANT:  Doree Fudge, [place].  BRIEF HISTORY:  The patient is a 45 year old male, who has had a history of pancreatitis in the past.  In December 2011, he presented with abdominal and chest pain and ultimately was diagnosed with pericarditis. He was hospitalized several days.  In March, he presented with traumatic right-sided heart failure symptoms with anasarca.  The right heart cath was suggestive of constrictive pericarditis.  He was treated with diuretics and returned with continued symptoms of right heart failure, was readmitted and Surgery was consulted.  CT scan of the chest was performed and showed a pericardium at least 10 mm thick and in addition had reflux of venous contrast into the hepatic veins consistent with passive congestion.  He also had elevated bilirubin and LFTs.  With his physical and radiographic findings, recommended pericardectomy.  The patient agreed and signed informed consent.  DESCRIPTION OF PROCEDURE:  With Swan-Ganz and arterial line monitors in place, the patient underwent general anesthesia without incident.  A TEE probe was placed and showed 1+ mitral regurgitation and findings consistent with thickened  pericardium and constriction.  Median sternotomy was performed.  The right pleural space was entered and 1800 mL of straw-colored pleural fluid was removed.  The pericardium was obviously very thickened.  Initially, the parietal layer of the pericardium was entered and dissected off the heart.  This still left a tightly bound visceral pericardium.  There were areas of necrotic debris and small pockets of old clotted blood throughout the space starting from the left ventricle.  The visceral pericardium with slow deliberation was carefully excised off the epicardium.  With this, obviously the left ventricle began bulging.  This dissection process was carried out for several hours to be able to excise the pericardium from phrenic nerve to phrenic nerve.  As we progressed from the left ventricle toward the right ventricle, the right ventricle and a right atrium, which was also involved was made a traumatic difference in filling and contraction.  With this process completed, specimens were cultured,.  Kawasaki virus, tuberculosis, routine cultures, and path were all sent.  Under direct vision, a PleurX catheter was placed and tunneled in the right chest and placed posteriorly in the right pleural space.  Bilateral chest tubes were placed.  Two Blake drains, one anterior, one inferior in the pericardium.  Pericardial space was performed.  The sternum was then closed with #6 stainless steel wire.  Fascia closed with interrupted 0 Vicryl and 3-0 Vicryl subcutaneous tissue, 4-0 subcuticular stitch in the skin edges.  Dry dressings were applied.  Sponge and needle count was reported as correct at completion of procedure.  TEE at the completion of procedure showed marked improvement in contractility especially of the right ventricle.  The mitral regurgitation was still present with a slightly wider base, but no increased amount.  It did not appear to be significant mitral regurgitation to what  placing the patient on bypass and placing a ring.  The patient was left intubated and was transferred to surgical intensive care unit for further postoperative care.     Sheliah Plane, MD     EG/MEDQ  D:  07/06/2010  T:  07/06/2010  Job:  [id number]  cc:   Rollene Rotunda, MD, [place] Todd,MD Electronically Signed by Sheliah Plane MD on 07/19/2010 11:04:33 AM

## 2010-07-22 ENCOUNTER — Ambulatory Visit (INDEPENDENT_AMBULATORY_CARE_PROVIDER_SITE_OTHER): Payer: Self-pay | Admitting: Cardiology

## 2010-07-22 ENCOUNTER — Encounter: Payer: Self-pay | Admitting: Cardiology

## 2010-07-22 DIAGNOSIS — I311 Chronic constrictive pericarditis: Secondary | ICD-10-CM

## 2010-07-22 DIAGNOSIS — G4733 Obstructive sleep apnea (adult) (pediatric): Secondary | ICD-10-CM

## 2010-07-22 DIAGNOSIS — R609 Edema, unspecified: Secondary | ICD-10-CM

## 2010-07-22 HISTORY — DX: Chronic constrictive pericarditis: I31.1

## 2010-07-22 MED ORDER — POTASSIUM CHLORIDE CRYS ER 20 MEQ PO TBCR: 80.0000 meq | EXTENDED_RELEASE_TABLET | Freq: Every day | ORAL | Status: DC

## 2010-07-22 MED ORDER — FUROSEMIDE 80 MG PO TABS: 80.0000 mg | ORAL_TABLET | Freq: Two times a day (BID) | ORAL | Status: DC

## 2010-07-22 NOTE — Progress Notes (Signed)
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 He was sent home with a Pleurx catheter. He has had better breathing he has continued to have edema. He is getting about 400-800 cc drained from this daily. He is trying to keep his feet elevated. Is not having a new chest discomfort, neck or arm discomfort though he has obvious sternal wound discomfort. He is not having any new palpitations, presyncope or syncope. He has had no fevers chills or new cough.  Allergies  Allergen Reactions  . Colchicine     REACTION: diarrhea  . Diphenhydramine Hcl     REACTION: itching    Current Outpatient Prescriptions  Medication Sig Dispense Refill  . citalopram (CELEXA) 40 MG tablet Take 20 mg by mouth daily.       . fenofibrate (TRICOR) 145 MG tablet Take 145 mg by mouth daily.        . furosemide (LASIX) 80 MG tablet Take 40 mg by mouth daily.       [name] lisinopril (PRINIVIL,ZESTRIL) 20 MG tablet Take 20 mg by mouth daily.        . metoprolol tartrate (LOPRESSOR) 25 MG tablet Take 12.5 mg by mouth 2 (two) times daily.        [name] oxyCODONE-acetaminophen (PERCOCET) 5-325 MG per tablet Take 1 tablet by mouth every 4 (four) hours as needed.        . potassium chloride (KLOR-CON) 10 MEQ CR tablet Take 20 mEq by mouth daily.        . rosuvastatin (CRESTOR) 40 MG tablet Take 10 mg by mouth at bedtime.       [name] DISCONTD: cyclobenzaprine (FLEXERIL) 10 MG tablet Take 10 mg by mouth 3 (three) times daily as needed.        [name] DISCONTD: gemfibrozil (LOPID) 600 MG tablet Take 600 mg by mouth 2 (two) times daily before a meal.        . DISCONTD: hydrochlorothiazide 25 MG tablet Take 25 mg by mouth daily.        [name] DISCONTD: HYDROcodone-acetaminophen (VICODIN ES) 7.5-750 MG per tablet Take 1 tablet by mouth. Take one tablet by mouth four times a day       . DISCONTD: lisinopril-hydrochlorothiazide (PRINZIDE,ZESTORETIC) 20-25 MG per tablet Take 1 tablet by  mouth daily.        [name] DISCONTD: nebivolol (BYSTOLIC) 5 MG tablet Take 5 mg by mouth daily.        [name] DISCONTD: predniSONE (DELTASONE) 20 MG tablet Take 20 mg by mouth as directed.        [name] DISCONTD: tadalafil (CIALIS) 20 MG tablet Take 20 mg by mouth daily as needed.          Past Medical History  Diagnosis Date  . Obesity   . Metabolic syndrome   . Hypertension   . Depression   . GERD (gastroesophageal reflux disease)   . Chronic back pain   . History of pancreatitis     a. admx 04-2009.Marland KitchenMarland Kitchen? 2-2 triglycerides  . Hypertriglyceridemia     a. followed by [facility]  . Acute pericarditis     admx 03-26-10 thru 03-29-10; a. echo 03-26-10: EF 55-60%; mild LVH; trivial MR; RVF ok; mild -mod circumferential Eff w/o tamponade    Past Surgical History  Procedure Date  . Tonsillectomy and adenoidectomy   . Carcoid tumor colon     ROS: As stated in the HPI and negative for all other  systems.  PHYSICAL EXAM BP 141/106  Pulse 87  Resp 18  Ht 5\' 10"  (1.778 m)  Wt 230 lb (104.327 kg)  BMI 33.00 kg/m2 GENERAL:  Well appearing HEENT:  Pupils equal round and reactive, fundi not visualized, oral mucosa unremarkable NECK:  Jugular venous distention to the jaw, waveform within normal limits, carotid upstroke brisk and symmetric, no bruits, no thyromegaly LYMPHATICS:  No cervical, inguinal adenopathy LUNGS:  Decreased breath sounds bilaterally with right lower lobe BACK:  No CVA tenderness CHEST:  Well healed sternotomy scar. HEART:  PMI not displaced or sustained,S1 and S2 within normal limits, no S3, no S4, no clicks, no rubs, no murmurs ABD:  Flat, positive bowel sounds normal in frequency in pitch, no bruits, no rebound, no guarding, no midline pulsatile mass, no hepatomegaly, no splenomegaly EXT:  2 plus pulses throughout, Severe edema bilaterally to above the kneesedema, no cyanosis no clubbing SKIN:  No rashes no nodules NEURO:  Cranial nerves II through XII grossly intact, motor  grossly intact throughout PSYCH:  Cognitively intact, oriented to person place and time   EKG:  Sinus rhythm, rate 85, poor anterior R-wave progression, low voltage in chest leads  ASSESSMENT AND PLAN

## 2010-07-22 NOTE — Patient Instructions (Signed)
Increase Furosemide to 80 mg twice a day Increase Potassium to as well Return Tuesday for blood work See Dr Antoine Poche 08/12/2010

## 2010-07-22 NOTE — Assessment & Plan Note (Signed)
He is now being followed for this.

## 2010-07-22 NOTE — Assessment & Plan Note (Signed)
As above.

## 2010-07-22 NOTE — Assessment & Plan Note (Signed)
He is status post pericardiectomy. He is to follow with his surgeon on the 6th of next month.  His nurse is checking his Pleurx.  I will increase his Lasix to 80 mg b.i.d. With 80 mEq of potassium the next 4 days. He will come back next Tuesday on the fifth day for a basic metabolic profile. He is to restrict salt and fluid keep his feet elevated and put his compression stockings back on.

## 2010-07-26 ENCOUNTER — Other Ambulatory Visit: Payer: Self-pay | Admitting: Family Medicine

## 2010-07-26 ENCOUNTER — Encounter: Payer: Self-pay | Admitting: Pulmonary Disease

## 2010-07-27 ENCOUNTER — Other Ambulatory Visit (INDEPENDENT_AMBULATORY_CARE_PROVIDER_SITE_OTHER): Payer: Self-pay | Admitting: *Deleted

## 2010-07-27 DIAGNOSIS — E7800 Pure hypercholesterolemia, unspecified: Secondary | ICD-10-CM

## 2010-07-27 DIAGNOSIS — Z79899 Other long term (current) drug therapy: Secondary | ICD-10-CM

## 2010-07-27 LAB — BASIC METABOLIC PANEL
BUN: 8 mg/dL (ref 6–23)
CO2: 27 mEq/L (ref 19–32)
Calcium: 8.9 mg/dL (ref 8.4–10.5)
Chloride: 100 mEq/L (ref 96–112)
Creatinine, Ser: 0.8 mg/dL (ref 0.4–1.5)
GFR: 146.17 mL/min (ref >60.00)
Glucose, Bld: 84 mg/dL (ref 70–99)
Potassium: 4.5 mEq/L (ref 3.5–5.1)
Sodium: 136 mEq/L (ref 135–145)

## 2010-07-27 LAB — LIPID PANEL
Cholesterol: 86 mg/dL (ref 0–200)
HDL: 21.7 mg/dL — ABNORMAL LOW (ref >39.00)
LDL Cholesterol: 52 mg/dL (ref 0–99)
Total CHOL/HDL Ratio: 4
Triglycerides: 62 mg/dL (ref 0.0–149.0)
VLDL: 12.4 mg/dL (ref 0.0–40.0)

## 2010-07-27 LAB — HEPATIC FUNCTION PANEL
ALT: 9 U/L (ref 0–53)
AST: 23 U/L (ref 0–37)
Albumin: 2.9 g/dL — ABNORMAL LOW (ref 3.5–5.2)
Alkaline Phosphatase: 100 U/L (ref 39–117)
Bilirubin, Direct: 1 mg/dL — ABNORMAL HIGH (ref 0.0–0.3)
Total Bilirubin: 1.9 mg/dL — ABNORMAL HIGH (ref 0.3–1.2)
Total Protein: 6.2 g/dL (ref 6.0–8.3)

## 2010-07-27 NOTE — Telephone Encounter (Signed)
Would not recommend that he take narcotics with this cardiac problem.  If his back is bothering him that much then no need to go see an orthopedist, Dr. Cleophas Dunker in group

## 2010-07-29 ENCOUNTER — Ambulatory Visit (INDEPENDENT_AMBULATORY_CARE_PROVIDER_SITE_OTHER): Payer: Self-pay

## 2010-07-29 VITALS — Wt 222.0 lb

## 2010-07-29 DIAGNOSIS — E781 Pure hyperglyceridemia: Secondary | ICD-10-CM

## 2010-07-29 DIAGNOSIS — F17200 Nicotine dependence, unspecified, uncomplicated: Secondary | ICD-10-CM

## 2010-07-29 NOTE — Patient Instructions (Addendum)
Great job on the improvement in your cholesterol  Continue current medications and lifestyle changes  Continue to think about quitting smoking.    Recheck labs in 6 months.

## 2010-07-29 NOTE — Assessment & Plan Note (Signed)
Pt's cholesterol is much improved.  TC- 86 (goal<200), TG- 62 (goal<150), HDL- 21 (goal>40), and LDL- 52 (goal<70).  LFTs are WNL.  Pt is tolerating medications well.  Will continue current regimen.  Encouraged him to keep up dietary changes.  Will f/u in 6 months.

## 2010-07-29 NOTE — Progress Notes (Signed)
This is a 45 year old male who presents for follow[facility]up [facility].  Since last visit, pt has been in the hospital several times for pericarditis and fluid overload.  In April he had a pericardectomy.  He is currently compliant with his Crestor but did run out of Tricor approximately 2 weeks ago.  He does report some leg pain when he gains some fluid.  He states it is better when the swelling is down and he is wearing his compression hose.  He still has some chest pain from his surgery last month and is having to take hydrocodone/APAP on a consistent basis.            Pt has been able to maintain the dietary changes he made at the end of last year.  He has started eating breakfast every day.  He does limit the number of eggs he has but increased his fruits.  Now that he is consistently eating breakfast, he may skip lunch most days because he is not hungry.  If he does eat lunch, it is usually a sandwich.  Dinner consists of backed chicken, [place], brocolli, etc.  He will splurge on fried foods once a week.  He has completely cut out all alcohol from his diet.         Pt is currently not exercising.  He is still limited due to surgery and is wearing oxygen on an as needed basis.         Pt is a smoker.  He smokes about 5 cigarettes/day and has smoked for 20 years.  He did talk to Dr. Tawanna Cooler about starting Chantix but is concerned over costs.  Discussed the difference in cost of Chantix and the cigarettes and it would be cheaper for him to take the Chantix.  He was given a coupon today in clinic if he decides to start taking it.   Current Outpatient Prescriptions  Medication Sig Dispense Refill  . citalopram (CELEXA) 40 MG tablet Take 20 mg by mouth daily.       . fenofibrate (TRICOR) 145 MG tablet Take 145 mg by mouth daily.        . furosemide (LASIX) 80 MG tablet Take 1 tablet (80 mg total) by mouth 2 (two) times daily.  60 tablet  11  . HYDROcodone-acetaminophen (LORTAB) 7.5-500 MG per tablet  Take 1-2 tablets by mouth. every 4-6 hours as needed       . lisinopril (PRINIVIL,ZESTRIL) 20 MG tablet Take 20 mg by mouth daily.        . metoprolol tartrate (LOPRESSOR) 25 MG tablet Take 12.5 mg by mouth 2 (two) times daily.        . potassium chloride (KLOR-CON) 10 MEQ CR tablet Take 20 mEq by mouth daily.        . potassium chloride SA (K-DUR,KLOR-CON) 20 MEQ tablet Take 4 tablets (80 mEq total) by mouth daily.  120 tablet  11  . rosuvastatin (CRESTOR) 10 MG tablet Take 10 mg by mouth daily.        [name] DISCONTD: oxyCODONE-acetaminophen (PERCOCET) 5-325 MG per tablet Take 1 tablet by mouth every 4 (four) hours as needed.        [name] DISCONTD: rosuvastatin (CRESTOR) 40 MG tablet Take 10 mg by mouth at bedtime.

## 2010-07-29 NOTE — Assessment & Plan Note (Signed)
Pt is still smoking.  He states he is still not ready to quit but will think about it.  He is aware of the risks of smoking.

## 2010-08-04 ENCOUNTER — Other Ambulatory Visit: Payer: Self-pay | Admitting: Cardiothoracic Surgery

## 2010-08-04 DIAGNOSIS — I309 Acute pericarditis, unspecified: Secondary | ICD-10-CM

## 2010-08-05 ENCOUNTER — Encounter (INDEPENDENT_AMBULATORY_CARE_PROVIDER_SITE_OTHER): Payer: Self-pay | Admitting: Cardiothoracic Surgery

## 2010-08-05 ENCOUNTER — Ambulatory Visit
Admission: RE | Admit: 2010-08-05 | Discharge: 2010-08-05 | Disposition: A | Discharge status: Home / Self Care | Payer: No Typology Code available for payment source | Source: Ambulatory Visit | Attending: Cardiothoracic Surgery | Admitting: Cardiothoracic Surgery

## 2010-08-05 DIAGNOSIS — J90 Pleural effusion, not elsewhere classified: Secondary | ICD-10-CM

## 2010-08-05 DIAGNOSIS — I309 Acute pericarditis, unspecified: Secondary | ICD-10-CM

## 2010-08-05 NOTE — Assessment & Plan Note (Signed)
OFFICE VISIT  Harold [name], Harold [name] DOB:  1965/10/30                                        Aug 05, 2010 CHART #:  [id number]  The patient returns to office today for postop visit following total pericardiectomy for severe constrictive pericarditis with acute and chronic systolic and diastolic heart failure with severe anasarca which was done on July 05, 2010.  Since discharge, the patient has had a sleep test and now is on home CPAP at night with oxygen.  He was discharged home on nasal cannula which he has been using on and off since discharge.  Because of significant right pleural effusion at the time of surgery, a PleurX catheter was also placed.  This has been drained Monday, Wednesday, and Fridays.  The drainage has decreased some, but still puts out about 200 each time it is drained.  His Lasix dose has been increased.  Overall, his heart failure is markedly improved.  He notes that he has much less edema, much less fatigue, less chest pain and overall much improved from his condition on just prior to surgery.  His current medications include: 1. Metoprolol 12.5 b.i.d. 2. Celexa 20 once a day. 3. Crestor 10 mg a day. 4. Fenofibrate 145 mg a day. 5. Vitamin B12. 6. Zantac. 7. Zyrtec. 8. Hydrocodone. 9. Potassium 40 mEq a day. 10.Lasix 80 p.o. b.i.d. 11.Lisinopril 20 mg p.o. daily.  His allergies are unchanged.  On physical exam his blood pressure remains elevated 150/114, 157/10 on the left arm, pulse 75, respiratory rate 20, O2 sats 99% on 2 liters nasal cannula.  He has decreased breath sounds over the left base, clear over the right.  The chest tube sutures were removed. The PleurX catheter was intact and secured in place.  His sternum is stable and well healed.  I do not appreciate any murmur.  His anasarca is markedly improved.  He has mild edema at each ankle, but legs are significantly better.  His recent laboratory done on Jul 27, 2010,  shows his bilirubin is decreased from 3.7 preop to 1.9, creatinine is 0.8.  Follow up chest x-ray shows clear lung fields on the right, small left pleural effusion is noted.  IMPRESSION:  The patient still has very poorly-controlled hypertension. He is on lisinopril and beta-blocker.  When seen in the lumbar office, his blood pressure was also elevated.  I have contacted Dr. Antoine Poche, who wished to see him this coming week to consider further blood pressure treatment and we will decrease the frequency of drainage of his PleurX catheter to Mondays and Thursdays, as his heart failure resolves, we will consider removing it.  I planned to see him back again in 3 weeks with chest x-ray.  Sheliah Plane, MD Electronically Signed  EG/MEDQ  D:  08/05/2010  T:  08/05/2010  Job:  [id number]  cc:   Tinnie Gens A. Tawanna Cooler, MD Rollene Rotunda, MD, [facility]

## 2010-08-09 ENCOUNTER — Encounter: Payer: Self-pay | Admitting: Pulmonary Disease

## 2010-08-09 ENCOUNTER — Ambulatory Visit (INDEPENDENT_AMBULATORY_CARE_PROVIDER_SITE_OTHER): Payer: Self-pay | Admitting: Pulmonary Disease

## 2010-08-09 ENCOUNTER — Encounter: Payer: Self-pay | Admitting: Cardiology

## 2010-08-09 ENCOUNTER — Ambulatory Visit (HOSPITAL_BASED_OUTPATIENT_CLINIC_OR_DEPARTMENT_OTHER): Payer: Medicaid Other | Attending: Pulmonary Disease

## 2010-08-09 VITALS — BP 154/100 | HR 76 | Temp 98.1°F | Ht 70.0 in | Wt 216.4 lb

## 2010-08-09 DIAGNOSIS — G4733 Obstructive sleep apnea (adult) (pediatric): Secondary | ICD-10-CM | POA: Insufficient documentation

## 2010-08-09 DIAGNOSIS — G4730 Sleep apnea, unspecified: Secondary | ICD-10-CM

## 2010-08-09 DIAGNOSIS — G4731 Primary central sleep apnea: Secondary | ICD-10-CM

## 2010-08-09 NOTE — Patient Instructions (Addendum)
Will send an order to [facility] to get you a mask adapter piece. Continue on cpap at current level Will schedule for titration study at sleep center to optimize your pressure.  Will call when this is done.

## 2010-08-09 NOTE — Progress Notes (Signed)
  Subjective:    Patient ID: Harold [name], male    DOB: [birth year], 45 y.o.   MRN: [id number]  HPI The pt comes in today for review of his recent sleep study.  He was in the hospital earlier in the year for pericarditis, and noted to have significant apnea during his catheterization.  He was ultimately discharged on bipap until he could have a formal sleep study.  He underwent NPSG where he was found to have severe osa with AHI 78/hr.  He had both central and obstructive events. The pt states he has been wearing bipap everynight, and has no issues with mask or pressure.  He thinks he is sleeping better and has improved daytime alertness.    Review of Systems  Constitutional: Negative for fever and unexpected weight change.  HENT: Positive for congestion, rhinorrhea, sneezing and sinus pressure. Negative for ear pain, nosebleeds, sore throat, trouble swallowing, dental problem and postnasal drip.   Eyes: Positive for itching. Negative for redness.  Respiratory: Positive for shortness of breath. Negative for cough, chest tightness and wheezing.   Cardiovascular: Positive for leg swelling. Negative for palpitations.  Gastrointestinal: Positive for nausea. Negative for vomiting.  Genitourinary: Negative for dysuria.  Musculoskeletal: Negative for joint swelling.  Skin: Negative for rash.  Neurological: Negative for headaches.  Hematological: Does not bruise/bleed easily.  Psychiatric/Behavioral: Negative for dysphoric mood. The patient is nervous/anxious.        Objective:   Physical Exam Obese male in nad No skin breakdown or pressure necrosis from cpap mask Chest is clear bilat Cor with rrr LE with edema, no cyanosis noted. Alert and oriented, appears slightly sleepy, moves all 4.        Assessment & Plan:

## 2010-08-10 NOTE — Op Note (Signed)
NAME:  Harold [name], Harold [name]               ACCOUNT NO.:  [id number]   MEDICAL RECORD NO.:  [id number]          PATIENT TYPE:  AMB   LOCATION:  SDS                          FACILITY:  [facility]   PHYSICIAN:  Dyke Brackett, M.D.    DATE OF BIRTH:  [birth year]   DATE OF PROCEDURE:  DATE OF DISCHARGE:  08/08/2008                               OPERATIVE REPORT   INDICATIONS:  This 45 year old with intractable shoulder pain, left,  status post right shoulder arthroscopy felt to be amenable to outpatient  surgery.   PREOPERATIVE DIAGNOSES:  1. Impingement.  2. Degenerative tearing, anterior superior labrum, partial rotator      cuff tear, partial subscap tear, very large os acromiale.   POSTOPERATIVE DIAGNOSES:  1. Impingement.  2. Degenerative tearing, anterior superior labrum, partial rotator      cuff tear, partial subscap tear, very large os acromiale.   OPERATION:  1. Arthroscopic acromioplasty.  2. Arthroscopic debridement to labrum and rotator cuff.   SURGEON:  Dyke Brackett, MD   ASSISTANT:  Joslyn Devon. [place], [place]   DESCRIPTION OF PROCEDURE:  Arthroscopic portals were created laterally,  anteriorly, and posteriorly.  After examination of anesthesia showed  normal range of motion, no instability to arthroscope as mentioned.  Systematic space intra-articularly showed very small partial thickness  and partial tear of the anterior edge of the supraspinatus was debrided,  not nearly full thickness, tendinopathy with some degenerative tearing  interstitially of the subscap without a frank avulsion debrided.  It  seemed almost slip into the joint somewhat as well as labral tearing  with the same, although no instability of the shoulder was noted,  debridement of the labrum, subscap, and supraspinatus was carried out.   Subacromial space was hypertrophied and inflamed.  It was resected.  There was a very large os acromiale present, which was impinging.  He  was counseled preoperatively that we  would not make an attempt to fix  the os or excise it.  Undersurface debridement was carried out.  Care  was made not to destabilize the os anymore than minimally, and  the [facility] joint was not violated as it seemed to have a relatively strong  articulation to the os.  Bursectomy was carried out at the superior  surface of the cup as expected, no full-thickness component appreciated,  light compressive dressing sling applied with stitches, and taken to  recovery room in stable condition.      Dyke Brackett, M.D.  Electronically Signed     WDC/MEDQ  D:  08/08/2008  T:  08/09/2008  Job:  [id number]

## 2010-08-12 ENCOUNTER — Ambulatory Visit (INDEPENDENT_AMBULATORY_CARE_PROVIDER_SITE_OTHER): Payer: Self-pay | Admitting: Cardiology

## 2010-08-12 ENCOUNTER — Encounter: Payer: Self-pay | Admitting: Cardiology

## 2010-08-12 VITALS — BP 158/102 | HR 65 | Ht 70.0 in | Wt 217.1 lb

## 2010-08-12 DIAGNOSIS — I311 Chronic constrictive pericarditis: Secondary | ICD-10-CM

## 2010-08-12 DIAGNOSIS — I10 Essential (primary) hypertension: Secondary | ICD-10-CM

## 2010-08-12 DIAGNOSIS — R9431 Abnormal electrocardiogram [ECG] [EKG]: Secondary | ICD-10-CM | POA: Insufficient documentation

## 2010-08-12 LAB — BASIC METABOLIC PANEL
BUN: 11 mg/dL (ref 6–23)
CO2: 24 mEq/L (ref 19–32)
Calcium: 9.2 mg/dL (ref 8.4–10.5)
Chloride: 102 mEq/L (ref 96–112)
Creatinine, Ser: 0.8 mg/dL (ref 0.4–1.5)
GFR: 146.14 mL/min (ref >60.00)
Glucose, Bld: 81 mg/dL (ref 70–99)
Potassium: 4.2 mEq/L (ref 3.5–5.1)
Sodium: 135 mEq/L (ref 135–145)

## 2010-08-12 LAB — MAGNESIUM: Magnesium: 1.7 mg/dL (ref 1.5–2.5)

## 2010-08-12 MED ORDER — LISINOPRIL 40 MG PO TABS: 40.0000 mg | ORAL_TABLET | Freq: Every day | ORAL | Status: DC

## 2010-08-12 NOTE — Progress Notes (Signed)
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 He was sent home with a Pleurx catheter.  Since I last saw him he has had much less drainage from his catheter.  He is breathing better and his lower extremity edema is improved.  He has had continued hypertension.  He denies any palpitations, chest pain neck or arm discomfort.  His breathing is much improved.  He is not having PND or orthopnea.  His weights are down.    Allergies  Allergen Reactions  . Colchicine     REACTION: diarrhea  . Diphenhydramine Hcl     REACTION: itching    Current Outpatient Prescriptions  Medication Sig Dispense Refill  . citalopram (CELEXA) 40 MG tablet Take 20 mg by mouth daily.       . fenofibrate (TRICOR) 145 MG tablet Take 145 mg by mouth daily.        . furosemide (LASIX) 80 MG tablet Take 1 tablet (80 mg total) by mouth 2 (two) times daily.  60 tablet  11  . HYDROcodone-acetaminophen (LORTAB) 7.5-500 MG per tablet Take 1-2 tablets by mouth. every 4-6 hours as needed       . lisinopril (PRINIVIL,ZESTRIL) 40 MG tablet Take 1 tablet (40 mg total) by mouth daily.  30 tablet  12  . metoprolol tartrate (LOPRESSOR) 25 MG tablet Take 12.5 mg by mouth 2 (two) times daily.        . potassium chloride SA (K-DUR,KLOR-CON) 20 MEQ tablet Take 4 tablets (80 mEq total) by mouth daily.  120 tablet  11  . rosuvastatin (CRESTOR) 10 MG tablet Take 10 mg by mouth daily.        [name] DISCONTD: lisinopril (PRINIVIL,ZESTRIL) 20 MG tablet Take 20 mg by mouth daily.        [name] DISCONTD: nebivolol (BYSTOLIC) 5 MG tablet Take 5 mg by mouth daily.        [name] DISCONTD: tadalafil (CIALIS) 20 MG tablet UAD         Past Medical History  Diagnosis Date  . Obesity   . Metabolic syndrome   . Hypertension   . Depression   . GERD (gastroesophageal reflux disease)   . Chronic back pain   . History of pancreatitis     a. admx 04-2009.Marland KitchenMarland Kitchen? 2-2 triglycerides  .  Hypertriglyceridemia     a. followed by [facility]  . Acute pericarditis     admx 03-26-10 thru 03-29-10; a. echo 03-26-10: EF 55-60%; mild LVH; trivial MR; RVF ok; mild -mod circumferential Eff w/o tamponade    Past Surgical History  Procedure Date  . Tonsillectomy and adenoidectomy     ROS: As stated in the HPI and negative for all other systems.  PHYSICAL EXAM BP 158/102  Pulse 65  Ht 5\' 10"  (1.778 m)  Wt 217 lb 1.9 oz (98.485 kg)  BMI 31.15 kg/m2 GENERAL:  Well appearing HEENT:  Pupils equal round and reactive, fundi not visualized, oral mucosa unremarkable NECK:  Jugular venous distention 8 cm, waveform within normal limits, carotid upstroke brisk and symmetric, no bruits, no thyromegaly LYMPHATICS:  No cervical, inguinal adenopathy LUNGS:  Decreased breath sounds bilaterally with right lower lobe BACK:  No CVA tenderness CHEST:  Well healed sternotomy scar, indwelling catheter HEART:  PMI not displaced or sustained,S1 and S2 within normal limits, no S3, no S4, no clicks, no rubs, no murmurs ABD:  Flat, positive bowel sounds normal in frequency in pitch, no bruits,  no rebound, no guarding, no midline pulsatile mass, no hepatomegaly, no splenomegaly EXT:  2 plus pulses throughout, Mild edema bilaterally to above the kneesedema, no cyanosis no clubbing SKIN:  No rashes no nodules NEURO:  Cranial nerves II through XII grossly intact, motor grossly intact throughout PSYCH:  Cognitively intact, oriented to person place and time   EKG:  Sinus rhythm, rate 65, deep anterior lateral T wave inversion with QT prolongation new since previous ECG.  ASSESSMENT AND PLAN

## 2010-08-12 NOTE — Assessment & Plan Note (Signed)
I will increase his Lisinopril to 40 mg daily.

## 2010-08-12 NOTE — Assessment & Plan Note (Addendum)
His ECG is markedly changed from previous.  I will check Mg and BMET today.  I will discuss with Dr. Tawanna Cooler the possibility of stopping the Celexa.

## 2010-08-12 NOTE — Assessment & Plan Note (Signed)
He will continue with current therapy.  He has follow up with Dr. Tyrone Sage later this month

## 2010-08-12 NOTE — Patient Instructions (Signed)
Your physician recommends that you schedule a follow-up appointment in: June  Your physician recommends that you return for lab work in: WITH DR GEXBMWUX AND TODAY  INCREASE LISINOPRIL TO 40 MG ONCE DAILY

## 2010-08-13 NOTE — H&P (Signed)
NAME:  Harold [name], Harold [name]                         ACCOUNT NO.:  [id number]   MEDICAL RECORD NO.:  [id number]                   PATIENT TYPE:  OIB   LOCATION:  2899                                 FACILITY:  [facility]   PHYSICIAN:  Hermelinda Medicus, M.D.                DATE OF BIRTH:  Sep 29, 1965   DATE OF ADMISSION:  05/22/2003  DATE OF DISCHARGE:                                HISTORY & PHYSICAL   HISTORY OF PRESENT ILLNESS:  The patient is a 45 year old male who has  recently had esophagoscopy by Dr. Sheryn Bison and he saw a concerning  area on his vocal cord and also on his pharynx.  He sent him to me where on  examination he had a very full tonsillar region on the right side.  He is a  smoker.  He also had a glimpse. We were concerned about a slight fullness in  his true vocal cord and I placed him on doxycycline originally.  He then had  very little response.  I placed him on Augmentin XR to try to prove a  tonsillar inflammation along with a lateral pharyngeal wall inflammation and  fullness.  On palpation, this felt firm, not so firm that it was hard but  also with full response to the Augmentin.  We then placed him on Avelox 400  mg daily.  This still did not gain any inflammation improvement in his right  tonsil.  A CT scan was obtained showing asymmetrical fullness in the  tonsillar region and the lateral pharyngeal wall with associated neck nodes  approaching 1.8 cm x 2.1 cm and fullness in the tonsillar pillar.  One lymph  node was 1.5 cm where the opposite side was somewhat similar but less in  size.  The concern is that he had a bilateral 2/3 lymphadenopathy, more on  the right side and inflammation is a consideration but also neoplastic  etiology is possible.  With this concern, a direct laryngoscopy and biopsy  along with a right tonsillectomy will be completed.   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   MEDICATIONS:  1. Zestoretic 20/25 daily.  2. Protonix 40 mg two per day.  3. Multivitamins.  4. Prednisone 10 mg daily.  5. Guaifenesin one daily.  6. He has also taken hydrocodone in the past for discomfort.   PAST SURGICAL HISTORY:  1. Left elbow fracture.  2. Arthroscopy of the right knee in 2003.  3. Rotator cuff in 1991.   SOCIAL HISTORY:  He smokes approximately half a pack a day.  He does not  drink.   REVIEW OF SYSTEMS:  He has some reflux esophagitis.  He has had back pain  and knee pain.  He has had some cough productive and shortness of breath due  to congestion.   PHYSICAL EXAMINATION:  VITAL SIGNS:  Blood pressure 136/82, height 5 feet 10  inches.  He weighs 230.  His heart rate is 62.  HEENT:  Ears are clear.  Tympanic membranes are clear.  His oral cavity is clear. His tonsil,  however, n the right side is quite enlarged.  It does show some erythema  with some firmness relative to the left which is also somewhat enlarged.  The right side also shows a questionable vocal cord nodule or fullness I  just caught on a glimpse as he is somewhat difficult to examine.  The  lateral pharyngeal wall on the right side also appears to be slightly full.  The base of his tongue is clear.  The oral cavity is otherwise clear.  The  nasopharynx and oropharynx and nose are free of any ulceration or mass.  NECK:  Free of any thyromegaly but does have some bilateral cervical  adenopathy, more on the right than on the left.  A fullness and a tenderness  associated with this.  CHEST:  Clear.  No rhonchi or wheezes.  CARDIOVASCULAR:  No __________ murmurs or gallops.  ABDOMEN:  Free of any organomegaly, tenderness or mass.  Mildly obese.  EXTREMITIES:  Unremarkable except for the previous surgery noted.  Left  elbow, right knee arthroscopy, right rotator cuff.   INITIAL DIAGNOSES:  1. Rule out tonsillar malignancy, possible lymphoma, right.  2. Rule out  true vocal nodule, doubt malignancy.  3. History of left elbow fracture.  4. History of arthroscopy, right knee.  5. History of rotator cuff repair.  6. History of reflux esophagitis.  7. History of back pain, knee and arm pain.  8. History of sinusitis, distant history.                                                Hermelinda Medicus, M.D.    JC/MEDQ  D:  05/22/2003  T:  05/22/2003  Job:  [id number]   cc:   Tinnie Gens A. Tawanna Cooler, M.D. [facility]   Vania Rea. Jarold Motto, M.D. [facility]

## 2010-08-13 NOTE — Consult Note (Signed)
[facility]  Patient:    Harold [name], Harold [name] Visit Number: [id number] MRN: [id number]          Service Type: PMG Location: TPC Attending Physician:  Sondra Come Dictated by:   Sondra Come, D.O. Proc. Date: 07/27/01 Admit Date:  07/20/2001   CC:         Evette Georges, M.D. [facility]   Consultation Report  HISTORY OF PRESENT ILLNESS:  Mr. Kunst returns to clinic today as scheduled for a left L5-S1 facet injection.  I initially saw Mr. Santone on July 24, 2001.  He continues to have left lower back pain at the L5-S1 region and has noted L5-S1 facet arthropathy, left greater than right, on MRI.  I review health and history form, and 14-point review of systems.  Pain today is a 6/10 on a subjective scale.  PHYSICAL EXAMINATION:  GENERAL:  Healthy male in no acute distress.  VITAL SIGNS:  Blood pressure 151/85, pulse 83, respirations 16, O2 saturation is 97% on room air.  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  PLAN:  Left L5-S1 facet injection.  The procedure was described to the patient in detail including risks, benefits, limitations, and alternatives.  The patient wishes to proceed.  Informed consent was obtained.  DESCRIPTION OF PROCEDURE:  The patient was brought back to the fluoroscopy suite and placed on the table in prone position.  The skin was prepped and draped in the usual sterile fashion.  Skin and subcutaneous tissues were anesthetized with 3 cc of 1% lidocaine.  Under direct fluoroscopic guidance, a 22-gauge 3-1/2-inch spinal needle was advanced into the inferior recess of L5-S1 on the left.  Injection of 0.25 cc of Omnipaque 180 revealed an L5-S1 arthrogram.  This was then injected with 0.5 cc of Kenalog 40 mg/cc plus 1 cc of preservative-free 0.25% Bupivacaine.  The patient tolerated the procedure well.  Discharge instructions given.  The patients post-injection pain  level was 4/10.  RECOMMENDATIONS: 1. The patient is to continue current medications. 2. The patient is to return to clinic in two weeks for reevaluation. 3. The patient is to call our clinic early next week and we will discuss    return to work plans.  The patient was educated on the above findings and recommendations, and understands.  There were no barriers to communication. Dictated by:   Sondra Come, D.O. Attending Physician:  Sondra Come DD:  07/27/01 TD:  07/29/01 Job: [id number]
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
## 2010-08-13 NOTE — Op Note (Signed)
NAME:  Harold [name], Harold [name]                         ACCOUNT NO.:  [id number]   MEDICAL RECORD NO.:  [id number]                   PATIENT TYPE:  OIB   LOCATION:  2899                                 FACILITY:  [facility]   PHYSICIAN:  Hermelinda Medicus, M.D.                DATE OF BIRTH:  October 21, 1965   DATE OF PROCEDURE:  05/22/2003  DATE OF DISCHARGE:                                 OPERATIVE REPORT   PREOPERATIVE DIAGNOSIS:  1. Right tonsillar mass with fullness with cervical adenopathy, rule out     lymphoma.  2. 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   POSTOPERATIVE DIAGNOSIS:  1. Right tonsillar mass with fullness with cervical adenopathy, rule out     lymphoma.  2. 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   OPERATION:  Direct laryngoscopy and biopsy with removal of vocal cord polyp  and a right tonsillectomy for lymphoma evaluation.   SURGEON:  Hermelinda Medicus, M.D.   ANESTHESIA:  General endotracheal.   PROCEDURE:  The patient was placed in the supine position.  Under general  endotracheal anesthesia, the laryngoscopy was first approached.  The patient  was prepped and the head drape was placed.  The laryngoscope was placed and  the larynx and pharynx were completely evaluated except for the anterior  commissure region.  Through this, we saw no evidence of abnormality but were  still concerned about the right tonsillar fullness and firmness in that  region.  The anterior commissure scope was then used and the true vocal cord  polyp was brought into position, it was unilateral just on the right side  and this was completely biopsy excised using the biopsy forceps.  The vocal  cord was, otherwise, in good condition and appeared to be a benign type  procedure, though this polyp was quite anterior and was approximately 4-5 mm  in size.   Once this was completed, then the tonsil was approached.  We reprepped,  gowned, and gloves.  The tonsillar gag was  placed and the pharynx was  carefully examined.  Once again, it was felt to be slightly inflamed, he had  been on antibiotics for approximately three weeks including Avalox.  The  right tonsil was removed using Bovie and blunt dissection.  All hemostasis  was established with Bovie electrocoagulation.  This was sent fresh for  lymphoma  evaluation.  We discussed this with pathology, there were two specimen, the  second to be evaluated, the tonsil, for lymphoma.  The patient tolerated the  procedure well and is doing well postop and we will follow closely in five  days, ten days, three weeks, five weeks, then depending on pathology result.  Hermelinda Medicus, M.D.    JC/MEDQ  D:  05/22/2003  T:  05/22/2003  Job:  [id number]   cc:   Tinnie Gens A. Tawanna Cooler, M.D. [facility]   Vania Rea. Jarold Motto, M.D. [facility]

## 2010-08-13 NOTE — Consult Note (Signed)
[facility]  Patient:    Harold [name], Harold [name] Visit Number: [id number] MRN: [id number]          Service Type: PMG Location: [facility] Attending Physician:  Sondra Come Dictated by:   Sondra Come, D.O. Proc. Date: 08/09/01 Admit Date:  07/20/2001   CC:         Harold [name], M.D. [facility]   Consultation Report  Harold [name] returns to clinic today as scheduled for reevaluation.  He was last seen on Jul 27, 2001 at which time he underwent an L5-S1 facet joint injection for facet arthropathy of the lumbar spine with low back pain.  Patient states that his pain has considerably improved following the injection.  His pain level today is a 5/10 on a subjective scale.  He has returned to work with good and bad days.  His function and quality of life indexes have improved. His sleep is good.  He takes hydrocodone rarely at nighttime for severe pain. He continues on Bextra 20 mg daily.  I reviewed health and history form and 14 point review of systems.  PHYSICAL EXAMINATION  VITAL SIGNS:  Blood pressure 152/90, pulse 64, respirations 16, O2 saturation 97% on room air.  BACK:  Level pelvis without scoliosis.  There is minimally decreased lumbar lordosis.  Range of motion is full in all planes with pain on extension, especially with left rotation plus extension.  Manual muscle testing is 5/5 bilateral lower extremities.  Sensory examination intact to light touch bilateral lower extremities.  Muscle stretch reflexes are symmetric bilateral lower extremities.  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  PLAN: 1. Repeat left L5-S1 facet injection. 2. Patient to continue Bextra 20 mg daily. 3. Continue hydrocodone just as needed and sparing use. 4. Patient to stay off work today. 5. Resume work Advertising account executive. 6. Patient to return to clinic in two weeks for reevaluation.  PROCEDURE:  Left L5-S1 facet  injection.  Informed consent was obtained. Patient was brought back to the fluoroscopy suite and placed on the table in prone position.  Skin was prepped and draped in the usual sterile fashion. Skin and subcutaneous tissues were anesthetized with 3 cc of 1% lidocaine. Under fluoroscopic guidance a 22-gauge 3.5 inch spinal needle was advanced into the inferior recess of L5-S1 facet joint on the left.  Injection of 0.25 cc of Omnipaque 180 revealed an L5-S1 facet arthrogram.  This was then injected with 0.5 cc of Kenalog 40 mg/cc plus 1 cc of preservative-free 0.25% bupivacaine.  Patient tolerated procedure well.  Discharge instructions given. Patients post injection pain level is 0/10.  Patient was educated on the above findings and recommendations and understands.  No barriers to communication. Dictated by:   Sondra Come, D.O. Attending Physician:  Sondra Come DD:  08/09/01 TD:  08/12/01 Job: [id number]

## 2010-08-13 NOTE — Consult Note (Signed)
[facility]  Patient:    Harold, [name] Visit Number: [id number] MRN: [id number]          Service Type: PMG Location: [facility] Attending Physician:  Sondra Come Dictated by:   Sondra Come, D.O. Proc. Date: 08/22/01 Admit Date:  07/20/2001   CC:         Evette Georges, M.D. [facility]   Consultation Report  [name] returns to clinic today as scheduled for reevaluation.  He is doing significantly well following left L5-S1 facet injection x2.  His pain today is a 3/10 on a subjective scale.  Patients function and quality of life indexes have improved significantly.  He states he is able to play basketball.  He continues to take Bextra 20 mg as needed and Flexeril as needed at bedtime. He also occasionally takes a Vicodin at bedtime.  We discussed medication options at this point.  I reviewed a lumbar stabilization exercise program and demonstrated exercises for [name].  I review health and history form and 14 point review of systems.  PHYSICAL EXAMINATION  GENERAL:  Healthy male in no acute distress.  VITAL SIGNS:  Blood pressure 136/76, pulse 68, respirations 12, O2 saturation 98% on room air.  NEUROLOGIC:  Manual muscle testing 5/5 bilateral lower extremities.  Sensory examination intact to light touch bilateral lower extremities.  Muscle stretch reflexes are symmetrical bilateral lower extremities.  Mild tenderness to palpation bilateral lumbar paraspinal muscles.  IMPRESSION:  Facet arthropathy of the lumbar spine with improved left L5-S1 facet syndrome.  PLAN: 1. Thorough discussion with [name] with counseling on home exercise    program as well as medication use.  At this point continue to take Bextra    20 mg one p.o. q.d. 2. Flexeril 10 mg one p.o. q.h.s. as needed #30 with one refill. 3. Will provide patient with Ultracet samples #20 to take one to two up to    q.i.d. as needed for pain not controlled with Bextra. 4.  Continue home exercise program. 5. Patient to return to clinic in two months for reevaluation.  Patient was educated on the above findings and recommendations and understands.  There were no barriers to communication. Dictated by:   Sondra Come, D.O. Attending Physician:  Sondra Come DD:  08/22/01 TD:  08/23/01 Job: [id number]

## 2010-08-15 ENCOUNTER — Encounter: Payer: Self-pay | Admitting: Pulmonary Disease

## 2010-08-15 NOTE — Assessment & Plan Note (Addendum)
The pt has severe complex apnea by his sleep study, and currently is on cpap which is appropriate.  He needs to have his pressure optimized, and with his complex apnea this will need to be done in the sleep center.  I have asked him to continue with his current cpap, which he thinks has helped, and will set up for formal cpap titration study at the sleep center.  I have also encouraged him to work on weight loss.

## 2010-08-17 LAB — AFB CULTURE WITH SMEAR (NOT AT ARMC)
Acid Fast Smear: NONE SEEN
Acid Fast Smear: NONE SEEN

## 2010-08-21 ENCOUNTER — Other Ambulatory Visit: Payer: Self-pay | Admitting: *Deleted

## 2010-08-21 MED ORDER — METOPROLOL TARTRATE 25 MG PO TABS: 12.5000 mg | ORAL_TABLET | Freq: Two times a day (BID) | ORAL | Status: DC

## 2010-08-25 ENCOUNTER — Other Ambulatory Visit: Payer: Self-pay | Admitting: Cardiothoracic Surgery

## 2010-08-25 DIAGNOSIS — J90 Pleural effusion, not elsewhere classified: Secondary | ICD-10-CM

## 2010-08-26 ENCOUNTER — Ambulatory Visit
Admission: RE | Admit: 2010-08-26 | Discharge: 2010-08-26 | Disposition: A | Discharge status: Home / Self Care | Payer: No Typology Code available for payment source | Source: Ambulatory Visit | Attending: Cardiothoracic Surgery | Admitting: Cardiothoracic Surgery

## 2010-08-26 ENCOUNTER — Encounter: Payer: Self-pay | Admitting: Pulmonary Disease

## 2010-08-26 ENCOUNTER — Encounter (INDEPENDENT_AMBULATORY_CARE_PROVIDER_SITE_OTHER): Payer: Self-pay | Admitting: Cardiothoracic Surgery

## 2010-08-26 DIAGNOSIS — I309 Acute pericarditis, unspecified: Secondary | ICD-10-CM

## 2010-08-26 DIAGNOSIS — J90 Pleural effusion, not elsewhere classified: Secondary | ICD-10-CM

## 2010-08-27 NOTE — Assessment & Plan Note (Signed)
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 At that time, he also had a right Pleurx catheter placed that has continued to drain approximately 175-200 mL each time it has drained twice per week.  Overall, the patient continues to improve.  He is increasing his physical activity.  He has had marked decrease in the lower extremity edema that he had bilaterally to the point where the diameter of his legs and calves are almost totally back to normal.  He notes that he did have some blood work done in [facility] office last week but was not sure of the results.  PHYSICAL EXAMINATION:  VITAL SIGNS:  His blood pressure 147/98, pulse 62, respiratory rate 20, and O2 saturation 99% on 2 liters of nasal cannula.  CHEST:  His sternum is stable and well healed.  His Pleurx is intact.  CARDIAC:  Regular rate and rhythm without murmur or gallop. EXTREMITIES:  As noted, both his lower legs and thighs are markedly decreased in size previously.  Review of his laboratory and EPIC, his potassium is 4.2.  His AST and ALT have returned to normal.  Follow up chest x-ray shows clear lung fields bilaterally.  The Pleurx catheter is in good position.  Unfortunately, the patient continues smoking, has requested nicotine patch.  I have given him 40 patches 15 mg transdermal and refilled Lortab 7.5/500, 40 tablets 1 p.o. q.6 h p.r.n. for pain.  We will plan to see him back with a chest x-ray in 1 month.  He is aware should the drainage from the Pleurx decrease to negligible amount, then to call and we would take out the Pleurx sooner.  The patient's medications in EPIC are reviewed.  He continues on metoprolol, Celexa, Crestor, potassium, Lasix, and  lisinopril.  Sheliah Plane, MD Electronically Signed  EG/MEDQ  D:  08/26/2010  T:  08/27/2010  Job:  [id number]  cc:   Rollene Rotunda, MD, [facility] Gens A. Tawanna Cooler, MD

## 2010-09-01 DIAGNOSIS — G4733 Obstructive sleep apnea (adult) (pediatric): Secondary | ICD-10-CM

## 2010-09-02 ENCOUNTER — Telehealth: Payer: Self-pay | Admitting: Pulmonary Disease

## 2010-09-02 DIAGNOSIS — G4731 Primary central sleep apnea: Secondary | ICD-10-CM

## 2010-09-02 DIAGNOSIS — G4739 Other sleep apnea: Secondary | ICD-10-CM

## 2010-09-02 NOTE — Telephone Encounter (Signed)
cpap titration study shows optimal pressure of 12cm.  Will let pt know and will send an order to [facility].

## 2010-09-02 NOTE — Procedures (Signed)
NAME:  Harold [name], Harold [name] NO.:  [id number]  MEDICAL RECORD NO.:  [id number]          PATIENT TYPE:  OUT  LOCATION:  SLEEP CENTER                 FACILITY:  [facility]  PHYSICIAN:  Barbaraann Share, MD,FCCPDATE OF BIRTH:  1968-02-29  DATE OF STUDY:  08/09/2010                           NOCTURNAL POLYSOMNOGRAM  REFERRING PHYSICIAN:  KEITH M CLANCE  INDICATION FOR STUDY:  Hypersomnia with sleep apnea.  EPWORTH SLEEPINESS SCORE:  14.  MEDICATIONS:  SLEEP ARCHITECTURE:  The patient had total sleep time of only 192 minutes with no slow wave sleep and only 30 minutes of REM.  Sleep onset latency was mildly prolonged at 34 minutes, and REM onset was delayed at 275 minutes.  Sleep efficiency was poor at 45%.  RESPIRATORY DATA:  The patient underwent a CPAP titration study with a small Quattro FX full-face mask.  He was started on a pressure of 5-cm and increased for both respiratory events and also snoring.  He was found to have good control both of those parameters at a CPAP pressure of 12-cm of water.  There was good control even through REM.  OXYGEN DATA:  There was O2 desaturation as low as 88% with the patient's obstructive events.  This resolved with optimal CPAP.  CARDIAC DATA:  Rare PVC noted, but no clinically significant arrhythmias were seen.  MOVEMENT-PARASOMNIA:  The patient had no significant leg jerks or other abnormal behaviors noted.  IMPRESSIONS-RECOMMENDATIONS: 1. Good control of previously documented obstructive sleep apnea with     a CPAP pressure of 12-cm of water, and delivered by a small Quattro     FX full-face mask.  The patient should also be encouraged to work     aggressively     on weight loss. 2. Rare PVC noted, but no clinically significant arrhythmias were     seen.     Barbaraann Share, MD,FCCP Diplomate, American Board of Sleep Medicine Electronically Signed    [facility]/MEDQ  D:  09/01/2010 19:50:59  T:  09/02/2010 08:46:39  Job:   [id number]

## 2010-09-07 NOTE — Telephone Encounter (Signed)
Called and spoke with pt. Pt aware of cpap pressure.  Pt states DME has already come to his home and set his pressure to 12.

## 2010-09-10 ENCOUNTER — Ambulatory Visit (INDEPENDENT_AMBULATORY_CARE_PROVIDER_SITE_OTHER): Payer: Self-pay | Admitting: Family Medicine

## 2010-09-10 ENCOUNTER — Ambulatory Visit: Payer: Self-pay | Admitting: Family Medicine

## 2010-09-10 ENCOUNTER — Encounter: Payer: Self-pay | Admitting: Family Medicine

## 2010-09-10 VITALS — BP 130/92 | Temp 98.8°F | Wt 222.0 lb

## 2010-09-10 DIAGNOSIS — M545 Low back pain, unspecified: Secondary | ICD-10-CM

## 2010-09-10 MED ORDER — HYDROCODONE-ACETAMINOPHEN 5-325 MG PO TABS: 2.0000 | ORAL_TABLET | Freq: Four times a day (QID) | ORAL | Status: DC | PRN

## 2010-09-10 NOTE — Progress Notes (Signed)
  Subjective:    Patient ID: Harold [name], male    DOB: July 06, 1965, 45 y.o.   MRN: [id number]  HPI Patient seen with low back pain. Onset about 2 weeks ago. Intermittent problems chronically. Location left lumbar. Sharp pain occasionally down below the left knee. No definite weakness. Numbness first and second toe intermittently. No recent injury. Hydrocodone helps slightly with 2 tablets.  Worse with position change. No incontinent symptoms. No dysuria. Denies fever or chills  Patient presented with profound dyspnea and lower extremity edema several months ago. Constrictive pericarditis. Underwent recent surgery and symptoms greatly improved. Also obstructive sleep apnea now on CPAP.   Review of Systems  Constitutional: Negative for fever, chills, appetite change and unexpected weight change.  Cardiovascular: Negative for chest pain and leg swelling.  Gastrointestinal: Negative for abdominal pain.  Genitourinary: Negative for dysuria and hematuria.  Neurological: Positive for numbness. Negative for weakness.  Hematological: Negative for adenopathy.       Objective:   Physical Exam  Constitutional: He appears well-developed and well-nourished.  Cardiovascular: Normal rate and regular rhythm.   Pulmonary/Chest: Effort normal and breath sounds normal. No respiratory distress. He has no wheezes. He has no rales.  Musculoskeletal: He exhibits no edema.       Straight leg raise is negative. Patient able to flex back 90. No localized tenderness  Neurological:       No weakness with plantar or dorsiflexion. Ankle reflexes 2+ bilaterally. Trace knee bilaterally  Skin: No rash noted.  Psychiatric: He has a normal mood and affect.          Assessment & Plan:  Low back pain with some left sided radiculopathy symptoms. No focal weakness. Patient previously intolerant to prednisone. Poor candidate for nonsteroidals. 1 refill hydrocodone 5/325 mg one to 2 every 6 hours as needed #40 with no  refill. Reviewed proper stretches. Consider physical therapy if no improvement 2 weeks

## 2010-09-10 NOTE — Patient Instructions (Signed)
Low Back Sprain with Rehab    A sprain is an injury in which a ligament is torn. The ligaments of the lower back are vulnerable to sprains. However, they are strong and require great force to be injured. These ligaments are important for stabilizing the spinal column. Sprains are classified into three categories. Grade 1 sprains cause pain, but the tendon is not lengthened. Grade 2 sprains include a lengthened ligament, due to the ligament being stretched or partially ruptured. With grade 2 sprains there is still function, although the function may be decreased. Grade 3 sprains involve a complete tear of the tendon or muscle, and function is usually impaired.   SYMPTOMS  Severe pain in the lower back.  Sometimes, a feeling of a "pop," "snap," or tear, at the time of injury.  Tenderness and sometimes swelling at the injury site.  Uncommonly, bruising (contusion) within 48 hours of injury.  Muscle spasms in the back.   CAUSES  Low back sprains occur when a force is placed on the ligaments that is greater than they can handle. Common causes of injury include:  Performing a stressful act while off-balance.  Repetitive stressful activities that involve movement of the lower back.  Direct hit (trauma) to the lower back.   RISK INCREASES WITH   Contact sports (football, wrestling).  Collisions (major skiing accidents).  Sports that require throwing or lifting (baseball, weightlifting).  Sports involving twisting of the spine (gymnastics, diving, tennis, golf).  Poor strength and flexibility.  Inadequate protection.  Previous back injury or surgery (especially fusion).   PREVENTIVE MEASURES   Wear properly fitted and padded protective equipment.  Warm up and stretch properly before activity.  Allow for adequate recovery between workouts.  Maintain physical fitness: l Strength, flexibility, and endurance. l Cardiovascular fitness.  Maintain a healthy body weight.     PROGNOSIS If treated properly, low back sprains usually heal with non-surgical treatment. The length of time for healing depends on the severity of the injury.    POSSIBLE COMPLICATIONS   Recurring symptoms, resulting in a chronic problem.  Chronic inflammation and pain in the low back.  Delayed healing or resolution of symptoms, especially if activity is resumed too soon.  Prolonged impairment.  Unstable or arthritic joints of the low back.   GENERAL TREATMENT CONSIDERATIONS  Treatment first involves the use of ice and medicine, to reduce pain and inflammation. The use of strengthening and stretching exercises may help reduce pain with activity. These exercises may be performed at home or with a therapist. Severe injuries may require referral to a therapist for further evaluation and treatment, such as ultrasound. Your caregiver may advise that you wear a back brace or corset, to help reduce pain and discomfort. Often, prolonged bed rest results in greater harm then benefit. Corticosteroid injections may be recommended. However, these should be reserved for the most serious cases. It is important to avoid using your back when lifting objects. At night, sleep on your back on a firm mattress, with a pillow placed under your knees. If non-surgical treatment is unsuccessful, surgery may be needed.    MEDICATION:   If pain medicine is needed, nonsteroidal anti-inflammatory medicines (aspirin and ibuprofen), or other minor pain relievers (acetaminophen), are often advised.   Do not take pain medicine for 7 days before surgery.   Prescription pain relievers may be given, if your caregiver thinks they are needed. Use only as directed and only as much as you need.  Ointments applied to   the skin may be helpful.  Corticosteroid injections may be given by your caregiver. These injections should be reserved for the most serious cases, because they may only be given a certain number of times.    HEAT AND COLD:   Cold treatment (icing) should be applied for 10 to 15 minutes every 2 to 3 hours for inflammation and pain, and immediately after activity that aggravates your symptoms. Use ice packs or an ice massage.  Heat treatment may be used before performing stretching and strengthening activities prescribed by your caregiver, physical therapist, or athletic trainer. Use a heat pack or a warm water soak.     SEEK MEDICAL CARE IF:   Symptoms get worse or do not improve in 2 to 4 weeks, despite treatment.  You develop numbness or weakness in either leg.  You lose bowel or bladder function.  Any of the following occur after surgery: fever, increased pain, swelling, redness, drainage of fluids, or bleeding in the affected area.  New, unexplained symptoms develop. (Drugs used in treatment may produce side effects.)     EXERCISES   RANGE OF MOTION AND STRETCHING EXERCISES - Low Back Sprain Most people with lower back pain will find that their symptoms get worse with excessive bending forward (flexion) or arching at the lower back (extension). The exercises that will help resolve your symptoms will focus on the opposite motion.    Your physician, physical therapist or athletic trainer will help you determine which exercises will be most helpful to resolve your lower back pain. Do not complete any exercises without first consulting with your caregiver. Discontinue any exercises which make your symptoms worse, until you speak to your caregiver.    If you have pain, numbness or tingling which travels down into your buttocks, leg or foot, the goal of the therapy is for these symptoms to move closer to your back and eventually resolve. Sometimes, these leg symptoms will get better, but your lower back pain may worsen. This is often an indication of progress in your rehabilitation. Be very alert to any changes in your symptoms and the activities in which you participated in the 24 hours prior  to the change. Sharing this information with your caregiver will allow him or her to most efficiently treat your condition.   These exercises may help you when beginning to rehabilitate your injury. Your symptoms may resolve with or without further involvement from your physician, physical therapist or athletic trainer. While completing these exercises, remember:   Restoring tissue flexibility helps normal motion to return to the joints. This allows healthier, less painful movement and activity.  An effective stretch should be held for at least 30 seconds.  A stretch should never be painful. You should only feel a gentle lengthening or release in the stretched tissue.      FLEXION RANGE OF MOTION AND STRETCHING EXERCISES:  STRETCH - Flexion, Single Knee to Chest   Lie on a firm bed or floor with both legs extended in front of you.  Keeping one leg in contact with the floor, bring your opposite knee to your chest. Hold your leg in place by either grabbing behind your thigh or at your knee.  Pull until you feel a gentle stretch in your low back. Hold __________ seconds.  Slowly release your grasp and repeat the exercise with the opposite side. Repeat __________ times. Complete this exercise __________ times per day.     STRETCH - Flexion, Double Knee to Chest     Lie on a firm bed or floor with both legs extended in front of you.  Keeping one leg in contact with the floor, bring your opposite knee to your chest.    Tense your stomach muscles to support your back and then lift your other knee to your chest. Hold your legs in place by either grabbing behind your thighs or at your knees.  Pull both knees toward your chest until you feel a gentle stretch in your low back. Hold __________ seconds.  Tense your stomach muscles and slowly return one leg at a time to the floor. Repeat __________ times. Complete this exercise __________ times per day.     STRETCH - Low Trunk Rotation   Lie  on a firm bed or floor. Keeping your legs in front of you, bend your knees so they are both pointed toward the ceiling and your feet are flat on the floor.  Extend your arms out to the side. This will stabilize your upper body by keeping your shoulders in contact with the floor.  Gently and slowly drop both knees together to one side until you feel a gentle stretch in your low back. Hold for __________ seconds.   Tense your stomach muscles to support your lower back as you bring your knees back to the starting position. Repeat the exercise to the other side. Repeat __________ times. Complete this exercise __________ times per day      EXTENSION RANGE OF MOTION AND FLEXIBILITY EXERCISES:  STRETCH - Extension, Prone on Elbows   Lie on your stomach on the floor, a bed will be too soft. Place your palms about shoulder width apart and at the height of your head.  Place your elbows under your shoulders. If this is too painful, stack pillows under your chest.  Allow your body to relax so that your hips drop lower and make contact more completely with the floor.  Hold this position for __________ seconds.  Slowly return to lying flat on the floor. Repeat __________ times. Complete this exercise __________ times per day.     RANGE OF MOTION - Extension, Prone Press Ups   Lie on your stomach on the floor, a bed will be too soft. Place your palms about shoulder width apart and at the height of your head.  Keeping your back as relaxed as possible, slowly straighten your elbows while keeping your hips on the floor. You may adjust the placement of your hands to maximize your comfort. As you gain motion, your hands will come more underneath your shoulders.  Hold this position __________ seconds.  Slowly return to lying flat on the floor. Repeat __________ times. Complete this exercise __________ times per day.     RANGE OF MOTION- Quadruped, Neutral Spine   Assume a hands and knees position  on a firm surface. Keep your hands under your shoulders and your knees under your hips. You may place padding under your knees for comfort.    Drop your head and point your tailbone toward the ground below you. This will round out your lower back like an angry cat. Hold this position for __________ seconds.   Slowly lift your head and release your tail bone so that your back sags into a large arch, like an old horse.  Hold this position for __________ seconds.   Repeat this until you feel limber in your low back.  Now, find your "sweet spot." This will be the most comfortable position somewhere between the two previous   positions. This is your neutral spine. Once you have found this position, tense your stomach muscles to support your low back.  Hold this position for __________ seconds. Repeat __________ times. Complete this exercise __________ times per day.      STRENGTHENING EXERCISES - Low Back Sprain These exercises may help you when beginning to rehabilitate your injury. These exercises should be done near your "sweet spot." This is the neutral, low-back arch, somewhere between fully rounded and fully arched, that is your least painful position. When performed in this safe range of motion, these exercises can be used for people who have either a flexion or extension based injury. These exercises may resolve your symptoms with or without further involvement from your physician, physical therapist or athletic trainer. While completing these exercises, remember:   Muscles can gain both the endurance and the strength needed for everyday activities through controlled exercises.  Complete these exercises as instructed by your physician, physical therapist or athletic trainer. Increase the resistance and repetitions only as guided.  You may experience muscle soreness or fatigue, but the pain or discomfort you are trying to eliminate should never worsen during these exercises. If this pain does  worsen, stop and make certain you are following the directions exactly. If the pain is still present after adjustments, discontinue the exercise until you can discuss the trouble with your caregiver.     STRENGTHENING - Deep Abdominals, Pelvic Tilt   Lie on a firm bed or floor. Keeping your legs in front of you, bend your knees so they are both pointed toward the ceiling and your feet are flat on the floor.  Tense your lower abdominal muscles to press your low back into the floor.  This motion will rotate your pelvis so that your tail bone is scooping upwards rather than pointing at your feet or into the floor. With a gentle tension and even breathing, hold this position for __________ seconds. Repeat __________ times. Complete this exercise __________ times per day.      STRENGTHENING - Abdominals, Crunches   Lie on a firm bed or floor. Keeping your legs in front of you, bend your knees so they are both pointed toward the ceiling and your feet are flat on the floor. Cross your arms over your chest.    Slightly tip your chin down without bending your neck.  Tense your abdominals and slowly lift your trunk high enough to just clear your shoulder blades. Lifting higher can put excessive stress on the lower back and does not further strengthen your abdominal muscles.  Control your return to the starting position. Repeat __________ times. Complete this exercise __________ times per day.     STRENGTHENING - Quadruped, Opposite UE/LE Lift   Assume a hands and knees position on a firm surface. Keep your hands under your shoulders and your knees under your hips. You may place padding under your knees for comfort.    Find your neutral spine and gently tense your abdominal muscles so that you can maintain this position. Your shoulders and hips should form a rectangle that is parallel with the floor and is not twisted.   Keeping your trunk steady, lift your right hand no higher than your shoulder  and then your left leg no higher than your hip. Make sure you are not holding your breath. Hold this position for __________ seconds.  Continuing to keep your abdominal muscles tense and your back steady, slowly return to your starting position. Repeat with the   opposite arm and leg. Repeat __________ times. Complete this exercise __________ times per day.      STRENGTHENING - Abdominals and Quadriceps, Straight Leg Raise   Lie on a firm bed or floor with both legs extended in front of you.  Keeping one leg in contact with the floor, bend the other knee so that your foot can rest flat on the floor.  Find your neutral spine, and tense your abdominal muscles to maintain your spinal position throughout the exercise.  Slowly lift your straight leg off the floor about 6 inches for a count of 15, making sure to not hold your breath.  Still keeping your neutral spine, slowly lower your leg all the way to the floor.   Repeat this exercise with each leg __________ times. Complete this exercise __________ times per day.     POSTURE AND BODY MECHANICS CONSIDERATIONS - Low Back Sprain Keeping correct posture when sitting, standing or completing your activities will reduce the stress put on different body tissues, allowing injured tissues a chance to heal and limiting painful experiences. The following are general guidelines for improved posture. Your physician or physical therapist will provide you with any instructions specific to your needs. While reading these guidelines, remember:  The exercises prescribed by your provider will help you have the flexibility and strength to maintain correct postures.  The correct posture provides the best environment for your joints to work. All of your joints have less wear and tear when properly supported by a spine with good posture. This means you will experience a healthier, less painful body.  Correct posture must be practiced with all of your activities,  especially prolonged sitting and standing. Correct posture is as important when doing repetitive low-stress activities (typing) as it is when doing a single heavy-load activity (lifting).     RESTING POSITIONS Consider which positions are most painful for you when choosing a resting position. If you have pain with flexion-based activities (sitting, bending, stooping, squatting), choose a position that allows you to rest in a less flexed posture. You would want to avoid curling into a fetal position on your side. If your pain worsens with extension-based activities (prolonged standing, working overhead), avoid resting in an extended position such as sleeping on your stomach. Most people will find more comfort when they rest with their spine in a more neutral position, neither too rounded nor too arched. Lying on a non-sagging bed on your side with a pillow between your knees, or on your back with a pillow under your knees will often provide some relief.  Keep in mind, being in any one position for a prolonged period of time, no matter how correct your posture, can still lead to stiffness.    PROPER SITTING POSTURE In order to minimize stress and discomfort on your spine, you must sit with correct posture. Sitting with good posture should be effortless for a healthy body. Returning to good posture is a gradual process. Many people can work toward this most comfortably by using various supports until they have the flexibility and strength to maintain this posture on their own.   When sitting with proper posture, your ears will fall over your shoulders and your shoulders will fall over your hips. You should use the back of the chair to support your upper back. Your lower back will be in a neutral position, just slightly arched. You may place a small pillow or folded towel at the base of your lower back for    support.    When working at a desk, create an environment that supports good, upright posture.  Without extra support, muscles tire, which leads to excessive strain on joints and other tissues. Keep these recommendations in mind:   CHAIR:    A chair should be able to slide under your desk when your back makes contact with the back of the chair. This allows you to work closely.  The chair's height should allow your eyes to be level with the upper part of your monitor and your hands to be slightly lower than your elbows.      BODY POSITION  Your feet should make contact with the floor. If this is not possible, use a foot rest.  Keep your ears over your shoulders. This will reduce stress on your neck and low back.     INCORRECT SITTING POSTURES  If you are feeling tired and unable to assume a healthy sitting posture, do not slouch or slump. This puts excessive strain on your back tissues, causing more damage and pain. Healthier options include:  Using more support, like a lumbar pillow.  Switching tasks to something that requires you to be upright or walking.  Talking a brief walk.  Lying down to rest in a neutral-spine position.      PROLONGED STANDING WHILE SLIGHTLY LEANING FORWARD  When completing a task that requires you to lean forward while standing in one place for a long time, place either foot up on a stationary 2-4 inch high object to help maintain the best posture. When both feet are on the ground, the lower back tends to lose its slight inward curve. If this curve flattens (or becomes too large), then the back and your other joints will experience too much stress, tire more quickly, and can cause pain.       CORRECT STANDING POSTURES Proper standing posture should be assumed with all daily activities, even if they only take a few moments, like when brushing your teeth. As in sitting, your ears should fall over your shoulders and your shoulders should fall over your hips. You should keep a slight tension in your abdominal muscles to brace your spine. Your tailbone  should point down to the ground, not behind your body, resulting in an over-extended swayback posture.      INCORRECT STANDING POSTURES  Common incorrect standing postures include a forward head, locked knees and/or an excessive swayback.     WALKING Walk with an upright posture. Your ears, shoulders and hips should all line-up.     PROLONGED ACTIVITY IN A FLEXED POSITION When completing a task that requires you to bend forward at your waist or lean over a low surface, try to find a way to stabilize 3 out of 4 of your limbs. You can place a hand or elbow on your thigh or rest a knee on the surface you are reaching across. This will provide you more stability, so that your muscles do not tire as quickly. By keeping your knees relaxed, or slightly bent, you will also reduce stress across your lower back.     CORRECT LIFTING TECHNIQUES DO :   Assume a wide stance. This will provide you more stability and the opportunity to get as close as possible to the object which you are lifting.  Tense your abdominals to brace your spine. Bend at the knees and hips. Keeping your back locked in a neutral-spine position, lift using your leg muscles. Lift with your legs, keeping your   back straight.  Test the weight of unknown objects before attempting to lift them.  Try to keep your elbows locked down at your sides in order get the best strength from your shoulders when carrying an object.  Always ask for help when lifting heavy or awkward objects.    INCORRECT LIFTING TECHNIQUES DO NOT:   Lock your knees when lifting, even if it is a small object.  Bend and twist. Pivot at your feet or move your feet when needing to change directions.  Assume that you can safely pick up even a paperclip without proper posture.   Document Released: 03/14/2005  Document Re-Released: 01/09/2009 [employer] Patient Information 2011 [employer]. 

## 2010-09-14 ENCOUNTER — Telehealth: Payer: Self-pay | Admitting: *Deleted

## 2010-09-14 NOTE — Telephone Encounter (Signed)
Spoke with Karen

## 2010-09-14 NOTE — Telephone Encounter (Signed)
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  I would ask them to call the cardiologist to see how they want to change his medication

## 2010-09-14 NOTE — Telephone Encounter (Signed)
Nurse has been seeing pt for two months now, he has a plurex catheter under the care of Dr. Lowella Fairy. However Dr. Tawanna Cooler is managing his blood pressure medication.  His current medications are lisinopril and metoprolol, bp remains in the 160 range over 90-100.  Thinks his medication needs to be adjusted.  Please advise.

## 2010-09-16 ENCOUNTER — Other Ambulatory Visit: Payer: Self-pay | Admitting: *Deleted

## 2010-09-16 MED ORDER — METOPROLOL TARTRATE 25 MG PO TABS: 12.5000 mg | ORAL_TABLET | Freq: Two times a day (BID) | ORAL | Status: DC

## 2010-09-23 ENCOUNTER — Ambulatory Visit (INDEPENDENT_AMBULATORY_CARE_PROVIDER_SITE_OTHER): Payer: Self-pay | Admitting: Family Medicine

## 2010-09-23 ENCOUNTER — Encounter: Payer: Self-pay | Admitting: Cardiology

## 2010-09-23 ENCOUNTER — Encounter: Payer: Self-pay | Admitting: Family Medicine

## 2010-09-23 ENCOUNTER — Ambulatory Visit (INDEPENDENT_AMBULATORY_CARE_PROVIDER_SITE_OTHER): Payer: Self-pay | Admitting: Cardiology

## 2010-09-23 VITALS — BP 138/90 | Temp 99.1°F | Wt 220.0 lb

## 2010-09-23 DIAGNOSIS — M549 Dorsalgia, unspecified: Secondary | ICD-10-CM

## 2010-09-23 DIAGNOSIS — I10 Essential (primary) hypertension: Secondary | ICD-10-CM

## 2010-09-23 DIAGNOSIS — G8929 Other chronic pain: Secondary | ICD-10-CM

## 2010-09-23 DIAGNOSIS — R609 Edema, unspecified: Secondary | ICD-10-CM

## 2010-09-23 MED ORDER — AMLODIPINE BESYLATE 5 MG PO TABS: 2.5000 mg | ORAL_TABLET | Freq: Every day | ORAL | Status: DC

## 2010-09-23 MED ORDER — METOPROLOL SUCCINATE ER 25 MG PO TB24: 25.0000 mg | ORAL_TABLET | Freq: Two times a day (BID) | ORAL | Status: DC

## 2010-09-23 MED ORDER — AMLODIPINE BESYLATE 2.5 MG PO TABS: 2.5000 mg | ORAL_TABLET | Freq: Every day | ORAL | Status: DC

## 2010-09-23 MED ORDER — POTASSIUM CHLORIDE CRYS ER 20 MEQ PO TBCR: 40.0000 meq | EXTENDED_RELEASE_TABLET | Freq: Every day | ORAL | Status: DC

## 2010-09-23 MED ORDER — OXYCODONE-ACETAMINOPHEN 7.5-500 MG PO TABS: ORAL_TABLET | ORAL | Status: DC

## 2010-09-23 MED ORDER — FUROSEMIDE 80 MG PO TABS: 80.0000 mg | ORAL_TABLET | Freq: Every day | ORAL | Status: DC

## 2010-09-23 NOTE — Assessment & Plan Note (Signed)
Today I will decrease the Lasix to 80 mg daily.

## 2010-09-23 NOTE — Progress Notes (Signed)
  Subjective:    Patient ID: Harold [name], male    DOB: 12/02/1965, 45 y.o.   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  Chronic back pain.  He taken a Vicodin 4 times a day however, he still having severe pain.  This is been an ongoing problem now for greater than a year.  We originally referred him to Dr. Madelon Lips, however, he owed him money and Dr. Madelon Lips, she is to follow.  We cannot find anybody else to see him for evaluation because he has no insurance   Review of Systems    Negative Objective:   Physical Exam    Well-developed well-nourished, in no acute distress.  Examination spine is normal.  Neurologic examination lower extremity shows normal sensation.  Muscle strength and reflexes    Assessment & Plan:  Chronic low back pain,,,,,,,,, Percocet one half to one tablet 3 times a day as needed for severe pain.  Return in 5 weeks for follow-up

## 2010-09-23 NOTE — Patient Instructions (Signed)
Percocet one half to one tablet 3 times a day.  Return in 5 weeks for follow-up.  We will try to get to set up to see the physicians in the pain clinic for complete evaluation

## 2010-09-23 NOTE — Progress Notes (Signed)
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 He was sent home with a Pleurx catheter.  Over the past several weeks the Pleurx catheter has drained less and less.  He feels "like I am 32."  He is exercising and denies any new shortness of breath, PND or orthopnea. Is not having any chest pressure, neck or arm discomfort. His lower extremity swelling has resolved. His weight is down dramatically.  He exercises routinely and is doing well with this.   Allergies  Allergen Reactions  . Colchicine     REACTION: diarrhea  . Diphenhydramine Hcl     REACTION: itching    Current Outpatient Prescriptions  Medication Sig Dispense Refill  . citalopram (CELEXA) 40 MG tablet Take 20 mg by mouth daily.       . fenofibrate (TRICOR) 145 MG tablet Take 145 mg by mouth daily.        . furosemide (LASIX) 80 MG tablet Take 1 tablet (80 mg total) by mouth 2 (two) times daily.  60 tablet  11  . HYDROcodone-acetaminophen (NORCO) 5-325 MG per tablet Take 2 tablets by mouth every 6 (six) hours as needed.  40 tablet  0  . lisinopril (PRINIVIL,ZESTRIL) 40 MG tablet Take 1 tablet (40 mg total) by mouth daily.  30 tablet  12  . metoprolol succinate (TOPROL-XL) 25 MG 24 hr tablet Take by mouth daily. Take 1/2 tab daily       . potassium chloride SA (K-DUR,KLOR-CON) 20 MEQ tablet Take 4 tablets (80 mEq total) by mouth daily.  120 tablet  11  . rosuvastatin (CRESTOR) 10 MG tablet Take 10 mg by mouth daily.        [name] DISCONTD: metoprolol tartrate (LOPRESSOR) 25 MG tablet Take 1 tablet (25 mg total) by mouth 2 (two) times daily.  60 tablet  6    Past Medical History  Diagnosis Date  . Obesity   . Metabolic syndrome   . Hypertension   . Depression   . GERD (gastroesophageal reflux disease)   . Chronic back pain   . History of pancreatitis     a. admx 04-2009.Marland KitchenMarland Kitchen? 2-2 triglycerides  . Hypertriglyceridemia     a. followed by [facility]  .  Acute pericarditis     admx 03-26-10 thru 03-29-10; a. echo 03-26-10: EF 55-60%; mild LVH; trivial MR; RVF ok; mild -mod circumferential Eff w/o tamponade    Past Surgical History  Procedure Date  . Tonsillectomy and adenoidectomy     ROS: As stated in the HPI and negative for all other systems except for back pain.  PHYSICAL EXAM BP 155/102  Pulse 65  Ht 5\' 10"  (1.778 m)  Wt 218 lb (98.884 kg)  BMI 31.28 kg/m2 GENERAL:  Well appearing HEENT:  Pupils equal round and reactive, fundi not visualized, oral mucosa unremarkable NECK:  Jugular venous distention 8 cm, waveform within normal limits, carotid upstroke brisk and symmetric, no bruits, no thyromegaly LYMPHATICS:  No cervical, inguinal adenopathy LUNGS:  Decreased breath sounds bilaterally with right lower lobe BACK:  No CVA tenderness CHEST:  Well healed sternotomy scar, indwelling catheter HEART:  PMI not displaced or sustained,S1 and S2 within normal limits, no S3, no S4, no clicks, no rubs, no murmurs ABD:  Flat, positive bowel sounds normal in frequency in pitch, no bruits, no rebound, no guarding, no midline pulsatile mass, no hepatomegaly, no splenomegaly EXT:  2 plus pulses throughout, no edema, no cyanosis no  clubbing SKIN:  No rashes no nodules NEURO:  Cranial nerves II through XII grossly intact, motor grossly intact throughout PSYCH:  Cognitively intact, oriented to person place and time   EKG:  Sinus rhythm, rate 65, deep anterior lateral T wave inversion with QT shortened since previous.  ASSESSMENT AND PLAN

## 2010-09-23 NOTE — Assessment & Plan Note (Signed)
His blood pressure is not at target.  I will add Norvasc 2.5 mg and increase the beta blocker.

## 2010-09-23 NOTE — Assessment & Plan Note (Signed)
The QT has shortened and he can continue with meds as listed.

## 2010-09-23 NOTE — Patient Instructions (Addendum)
Please start Norvasc (amlodipine) 2.5 mg once a day. Increase Metoprolol to one tablet twice a day Decrease Lasix (Furosemide) to 80 mg a day. Decrease Potassium CHL to 40 MEQ a day. Follow up with Dr Antoine Poche in 6 weeks.

## 2010-09-24 ENCOUNTER — Other Ambulatory Visit: Payer: Self-pay | Admitting: Cardiothoracic Surgery

## 2010-09-24 DIAGNOSIS — I309 Acute pericarditis, unspecified: Secondary | ICD-10-CM

## 2010-09-26 ENCOUNTER — Other Ambulatory Visit: Payer: Self-pay | Admitting: Cardiology

## 2010-09-27 ENCOUNTER — Ambulatory Visit
Admission: RE | Admit: 2010-09-27 | Discharge: 2010-09-27 | Disposition: A | Discharge status: Home / Self Care | Payer: No Typology Code available for payment source | Source: Ambulatory Visit | Attending: Cardiothoracic Surgery | Admitting: Cardiothoracic Surgery

## 2010-09-27 ENCOUNTER — Encounter (INDEPENDENT_AMBULATORY_CARE_PROVIDER_SITE_OTHER): Payer: Self-pay

## 2010-09-27 DIAGNOSIS — J90 Pleural effusion, not elsewhere classified: Secondary | ICD-10-CM

## 2010-09-27 DIAGNOSIS — I309 Acute pericarditis, unspecified: Secondary | ICD-10-CM

## 2010-09-28 NOTE — Assessment & Plan Note (Signed)
OFFICE VISIT  Harold [name], Harold [name] DOB:  08-22-1965                                        September 27, 2010 CHART #:  [id number]  The patient comes in today for 48-month followup.  He is status post pericardectomy for relief of constrictive pericarditis and placement of a right Pleurx catheter on July 15, 2010.  Currently, his Pleurx catheter is being drained on Monday and Thursday.  The patient reports minimal drainage over the past 3-4 weeks.  At the most, they have gotten about 50 mL out though for the past 2 weeks, he has had no drainage out. He is still having a moderate amount of pain particularly related to the drainage sessions.  He overall is much better, however.  His breathing has been stable and he is back to pretty much his usual activities at this point.  His lower extremity edema has completely resolved. Overall, he is progressing well.  PHYSICAL EXAMINATION:  Blood pressure 144/92, pulse is 66, respirations 16, O2 sat 97% on room air.  His sternal incision is healed and sternum is stable to palpation.  HEART:  Regular rate and rhythm without murmurs, rubs, or gallops.  Lungs are clear.  His right Pleurx catheter site is clean and dry.  Chest x-ray today shows resolution of his right effusion.  ASSESSMENT AND PLAN:  The patient is progressing well status post pericardectomy and Pleurx catheter placement.  Hopefully, we can remove his Pleurx catheter soon.  Dr. Tyrone Sage is currently on vacation, but we will discuss this with him when he returns and we will schedule the patient to return to Short Stay hopefully within the next week or two for removal of his Pleurx catheter.  He may call in the interim if he experiences any problems or has questions.  Coral Ceo, P.A.  GC/MEDQ  D:  09/27/2010  T:  09/28/2010  Job:  [id number]  cc:   Rollene Rotunda, MD, [facility] T[facility] Office [place] [facility]. Tawanna Cooler, MD

## 2010-09-30 ENCOUNTER — Ambulatory Visit (HOSPITAL_COMMUNITY)
Admission: RE | Admit: 2010-09-30 | Discharge: 2010-09-30 | Disposition: A | Discharge status: Home / Self Care | Payer: Medicaid Other | Source: Ambulatory Visit | Attending: Cardiothoracic Surgery | Admitting: Cardiothoracic Surgery

## 2010-09-30 DIAGNOSIS — J90 Pleural effusion, not elsewhere classified: Secondary | ICD-10-CM

## 2010-09-30 DIAGNOSIS — I311 Chronic constrictive pericarditis: Secondary | ICD-10-CM | POA: Insufficient documentation

## 2010-09-30 DIAGNOSIS — I5043 Acute on chronic combined systolic (congestive) and diastolic (congestive) heart failure: Secondary | ICD-10-CM | POA: Insufficient documentation

## 2010-09-30 DIAGNOSIS — Z4682 Encounter for fitting and adjustment of non-vascular catheter: Secondary | ICD-10-CM | POA: Insufficient documentation

## 2010-10-01 ENCOUNTER — Encounter: Payer: Self-pay | Admitting: Pulmonary Disease

## 2010-10-01 ENCOUNTER — Telehealth: Payer: Self-pay | Admitting: *Deleted

## 2010-10-06 ENCOUNTER — Other Ambulatory Visit: Payer: Self-pay | Admitting: Cardiothoracic Surgery

## 2010-10-06 DIAGNOSIS — I309 Acute pericarditis, unspecified: Secondary | ICD-10-CM

## 2010-10-07 ENCOUNTER — Encounter: Payer: Self-pay | Admitting: Cardiothoracic Surgery

## 2010-10-07 ENCOUNTER — Ambulatory Visit (INDEPENDENT_AMBULATORY_CARE_PROVIDER_SITE_OTHER): Payer: Self-pay | Admitting: Cardiothoracic Surgery

## 2010-10-07 ENCOUNTER — Ambulatory Visit
Admission: RE | Admit: 2010-10-07 | Discharge: 2010-10-07 | Disposition: A | Discharge status: Home / Self Care | Payer: No Typology Code available for payment source | Source: Ambulatory Visit | Attending: Cardiothoracic Surgery | Admitting: Cardiothoracic Surgery

## 2010-10-07 ENCOUNTER — Ambulatory Visit: Payer: No Typology Code available for payment source | Admitting: Cardiothoracic Surgery

## 2010-10-07 VITALS — BP 150/97 | HR 64 | Resp 16

## 2010-10-07 DIAGNOSIS — J90 Pleural effusion, not elsewhere classified: Secondary | ICD-10-CM

## 2010-10-07 DIAGNOSIS — Z09 Encounter for follow-up examination after completed treatment for conditions other than malignant neoplasm: Secondary | ICD-10-CM

## 2010-10-07 DIAGNOSIS — I311 Chronic constrictive pericarditis: Secondary | ICD-10-CM

## 2010-10-07 DIAGNOSIS — I309 Acute pericarditis, unspecified: Secondary | ICD-10-CM

## 2010-10-07 NOTE — Progress Notes (Signed)
See dictation in Echart for exam and history

## 2010-10-07 NOTE — Assessment & Plan Note (Signed)
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  At that time, he had also been plagued by significant right pleural effusion and right Pleurx catheter was placed and has been intermittently drained over the past several months.  Last week, the drainage had decreased to a minimal amount with a clear chest x-ray. One week ago, the Pleurx catheter was removed.  He returns today for followup visit with a chest x-ray.  Overall, the patient seems to be making good progress postoperatively and his symptoms, especially of anasarca has dramatically improved.  He notes that he continues to see Dr. Rollene Rotunda for attempts at controlling his longstanding hypertension.  Today in the office, his blood pressure is 150/97, pulse is 65, respiratory rate is 18, and O2 saturation is 96%.  His sternum is stable and well healed.  His lungs are clear.  Prior to surgery, he had severe anasarca.  This is markedly improved.  On chest x-ray today, he has clear lung fields bilaterally without evidence of pleural effusion.  In EPIC system, his medications are reviewed and outlined there.  I have not made him return appointment to see me as he is now 3 months postop.  He continues to see Dr. Antoine Poche for hypertension management.  Harold Plane, MD Electronically Signed  EG/MEDQ  D:  10/07/2010  T:  10/07/2010  Job:  [id number]  cc:   Rollene Rotunda, MD, [facility] Barbaraann Share, MD,FCCP Eugenio Hoes Tawanna Cooler, MD

## 2010-10-07 NOTE — Progress Notes (Deleted)
  Subjective:    Patient ID: Harold [name], male    DOB: [birth year], 45 y.o.   MRN: [id number]  HPI    Review of Systems     Objective:   Physical Exam        Assessment & Plan:

## 2010-10-07 NOTE — Progress Notes (Signed)
  Subjective:    Patient ID: Harold [name], male    DOB: October 24, 1965, 45 y.o.   MRN: [id number]  HPI    Review of Systems  Skin: Negative for color change and wound.       Objective:   Physical Exam        Assessment & Plan:

## 2010-10-14 ENCOUNTER — Ambulatory Visit (INDEPENDENT_AMBULATORY_CARE_PROVIDER_SITE_OTHER): Payer: Self-pay | Admitting: Family Medicine

## 2010-10-14 ENCOUNTER — Encounter: Payer: Self-pay | Admitting: Family Medicine

## 2010-10-14 ENCOUNTER — Ambulatory Visit: Payer: Self-pay | Admitting: Cardiothoracic Surgery

## 2010-10-14 DIAGNOSIS — E669 Obesity, unspecified: Secondary | ICD-10-CM

## 2010-10-14 DIAGNOSIS — M549 Dorsalgia, unspecified: Secondary | ICD-10-CM

## 2010-10-14 DIAGNOSIS — G8929 Other chronic pain: Secondary | ICD-10-CM

## 2010-10-14 MED ORDER — OXYCODONE-ACETAMINOPHEN 7.5-500 MG PO TABS: ORAL_TABLET | ORAL | Status: DC

## 2010-10-14 NOTE — Patient Instructions (Signed)
Increase the Percocet to one tablet 4 times daily return in 4 weeks for follow-up

## 2010-10-14 NOTE — Progress Notes (Signed)
  Subjective:    Patient ID: Harold [name], male    DOB: 07/11/1965, 45 y.o.   MRN: [id number]  Harold [name] Is a 45 year old single male ex smoker x 3 weeks, who comes in today for reevaluation of chronic back pain.  As noted previously.  He seen orthopedics.  They did not find any surgical correctable cause.  Because of his disability.  He can't work.  He recently went underwent a disability evaluation and is waiting for approval.  Once he gets disability, then, we will refer him to the pain clinic.  In the meantime, we will treat his pain.  He has signed a recent pain Contract  and we will do a drug screen today.  His blood pressure is 120/80.  He recently had the pericardial drain removed.  His weight has gone from 272 to 222!!!!!!!!!!!!!!.  He is to see Dr. Leta Jungling in August    Review of Systems General and neurologic review of systems otherwise negative    Objective:   Physical Exam    Well-developed well-nourished, male in no acute distress    Assessment & Plan:  Chronic back pain continue Percocet increase dose to one tablet 4 times daily.  Follow-up in 4 weeks

## 2010-10-15 LAB — DRUG SCREEN, URINE
Amphetamine Screen, Ur: NEGATIVE
Barbiturate Quant, Ur: NEGATIVE
Benzodiazepines.: NEGATIVE
Cocaine Metabolites: NEGATIVE
Creatinine,U: 31.3 mg/dL
Marijuana Metabolite: NEGATIVE
Methadone: NEGATIVE
Opiates: NEGATIVE
Phencyclidine (PCP): NEGATIVE
Propoxyphene: NEGATIVE

## 2010-10-18 ENCOUNTER — Ambulatory Visit: Payer: Self-pay | Admitting: Family Medicine

## 2010-11-09 ENCOUNTER — Encounter: Payer: Self-pay | Admitting: Cardiology

## 2010-11-09 ENCOUNTER — Ambulatory Visit (INDEPENDENT_AMBULATORY_CARE_PROVIDER_SITE_OTHER): Payer: Self-pay | Admitting: Cardiology

## 2010-11-09 DIAGNOSIS — I311 Chronic constrictive pericarditis: Secondary | ICD-10-CM

## 2010-11-09 DIAGNOSIS — I10 Essential (primary) hypertension: Secondary | ICD-10-CM

## 2010-11-09 DIAGNOSIS — R609 Edema, unspecified: Secondary | ICD-10-CM

## 2010-11-09 MED ORDER — LISINOPRIL-HYDROCHLOROTHIAZIDE 20-25 MG PO TABS: 1.0000 | ORAL_TABLET | Freq: Every day | ORAL | Status: DC

## 2010-11-09 MED ORDER — POTASSIUM CHLORIDE CRYS ER 20 MEQ PO TBCR: 20.0000 meq | EXTENDED_RELEASE_TABLET | Freq: Every day | ORAL | Status: DC

## 2010-11-09 MED ORDER — FUROSEMIDE 40 MG PO TABS: 40.0000 mg | ORAL_TABLET | Freq: Every day | ORAL | Status: DC

## 2010-11-09 NOTE — Progress Notes (Signed)
HPI The patient presents for followup of his difficult to control hypertension.  Since I last saw him he ran out of Lisinopril and started back on Lisinopril/hct 20/25 that he had laying around.  On this drug his BP was much better.  He tolerated increased beta blocker and decreased lasix.  The patient denies any new symptoms such as chest discomfort, neck or arm discomfort. There has been no new shortness of breath, PND or orthopnea. There have been no reported palpitations, presyncope or syncope. He does complain of some pain in his feet possibly secondary to neuropathy.  Allergies  Allergen Reactions  . Colchicine     REACTION: diarrhea  . Diphenhydramine Hcl     REACTION: itching    Current Outpatient Prescriptions  Medication Sig Dispense Refill  . amLODipine (NORVASC) 2.5 MG tablet Take 1 tablet (2.5 mg total) by mouth daily.  30 tablet  11  . citalopram (CELEXA) 40 MG tablet Take 20 mg by mouth daily.       . fenofibrate (TRICOR) 145 MG tablet Take 145 mg by mouth daily.        . furosemide (LASIX) 80 MG tablet Take 1 tablet (80 mg total) by mouth daily.  30 tablet  11  . HYDROcodone-acetaminophen (NORCO) 5-325 MG per tablet Take 2 tablets by mouth every 6 (six) hours as needed.        [name] lisinopril (PRINIVIL,ZESTRIL) 40 MG tablet Take 1 tablet (40 mg total) by mouth daily.  30 tablet  12  . metoprolol succinate (TOPROL-XL) 25 MG 24 hr tablet Take 1 tablet (25 mg total) by mouth 2 (two) times daily.  60 tablet  6  . Multiple Vitamins-Minerals (MEGA MULTIVITAMIN FOR MEN) TABS Take 500 mg by mouth 2 (two) times daily.        [name] oxyCODONE-acetaminophen (PERCOCET) 7.5-500 MG per tablet One tablet 4 times daily  120 tablet  0  . potassium chloride SA (K-DUR,KLOR-CON) 20 MEQ tablet Take 2 tablets (40 mEq total) by mouth daily.  120 tablet  11  . rosuvastatin (CRESTOR) 10 MG tablet Take 10 mg by mouth daily.          Past Medical History  Diagnosis Date  . Obesity   . Metabolic syndrome     . Hypertension   . Depression   . GERD (gastroesophageal reflux disease)   . Chronic back pain   . History of pancreatitis     a. admx 04-2009.Marland KitchenMarland Kitchen? 2-2 triglycerides  . Hypertriglyceridemia     a. followed by [facility]  . Acute pericarditis     admx 03-26-10 thru 03-29-10; a. echo 03-26-10: EF 55-60%; mild LVH; trivial MR; RVF ok; mild -mod circumferential Eff w/o tamponade  . Constrictive pericarditis     Past Surgical History  Procedure Date  . Tonsillectomy and adenoidectomy   . Pericardectomy 07/15/2010    Dr. Tyrone Sage  . Pleurx catheter placement 07/15/2010    Dr. Tyrone Sage    ROS: As stated in the HPI and negative for all other systems except for back pain.  PHYSICAL EXAM BP 180/92  Pulse 88  Ht 5\' 10"  (1.778 m)  Wt 223 lb (101.152 kg)  BMI 32.00 kg/m2 GENERAL:  Well appearing HEENT:  Pupils equal round and reactive, fundi not visualized, oral mucosa unremarkable NECK:  Jugular venous distention 8 cm, waveform within normal limits, carotid upstroke brisk and symmetric, no bruits, no thyromegaly LYMPHATICS:  No cervical, inguinal adenopathy LUNGS:  Decreased breath sounds bilaterally  with right lower lobe BACK:  No CVA tenderness CHEST:  Well healed sternotomy scar, indwelling catheter HEART:  PMI not displaced or sustained,S1 and S2 within normal limits, no S3, no S4, no clicks, no rubs, no murmurs ABD:  Flat, positive bowel sounds normal in frequency in pitch, no bruits, no rebound, no guarding, no midline pulsatile mass, no hepatomegaly, no splenomegaly EXT:  2 plus pulses throughout, no edema, no cyanosis no clubbing SKIN:  No rashes no nodules NEURO:  Cranial nerves II through XII grossly intact, motor grossly intact throughout PSYCH:  Cognitively intact, oriented to person place and time  ASSESSMENT AND PLAN

## 2010-11-09 NOTE — Assessment & Plan Note (Signed)
I will be trying to reduce his Lasix to the lowest dose now that he is post pericardiectomy.

## 2010-11-09 NOTE — Assessment & Plan Note (Signed)
At this point I will change him to lisinopril HCT 20/25. I'm going to further reduce his lasix to 40 mg and his KCL to 20 meq.  I will check a BMET in 1 week.

## 2010-11-09 NOTE — Patient Instructions (Signed)
Please decrease Lasix to 40 mg a day, decrease potassium to 20 mEq a day, stop Lisinopril and start Lisinopril/HCT 20-25 mg a day  Please have lab work in one week between the hours of 8:30 am to 1:30 pm and 2:30 to 4:30.  Follow up with Dr Antoine Poche in 2 months

## 2010-11-11 ENCOUNTER — Ambulatory Visit (INDEPENDENT_AMBULATORY_CARE_PROVIDER_SITE_OTHER): Payer: Self-pay | Admitting: Family Medicine

## 2010-11-11 ENCOUNTER — Encounter: Payer: Self-pay | Admitting: Family Medicine

## 2010-11-11 VITALS — BP 128/88 | Temp 98.8°F | Wt 224.0 lb

## 2010-11-11 DIAGNOSIS — M549 Dorsalgia, unspecified: Secondary | ICD-10-CM

## 2010-11-11 DIAGNOSIS — G8929 Other chronic pain: Secondary | ICD-10-CM | POA: Insufficient documentation

## 2010-11-11 MED ORDER — HYDROCODONE-ACETAMINOPHEN 7.5-750 MG PO TABS: 1.0000 | ORAL_TABLET | Freq: Four times a day (QID) | ORAL | Status: DC | PRN

## 2010-11-11 NOTE — Patient Instructions (Signed)
Stop the Percocet.  Take the hydrocodone 7.5 751 tablet 4 times a day as needed for severe pain.  This in a shoe.  Gait, your health insurance, let us know and we would be to set up for a neurosurgical consultation to evaluate your back

## 2010-11-11 NOTE — Progress Notes (Signed)
  Subjective:    Patient ID: Harold [name], male    DOB: Mar 28, 1966, 45 y.o.   MRN: [id number]  HPI Harold [name] is a 45 year old single male, who comes in today for reevaluation of chronic back pain.  Cardiac-wise he is doing well.  He saw Dr. Leta Jungling recently and is clinically stable.  His Lasix was reduced to one tablet daily, and cardiac-wise, he feels fine.  He continues to have chronic low back pain and is on Percocet one tablet 4 times daily.  We are unable to get him set up to see any of the specialists or go to the pain clinic because they want to accept him because he has no insurance.  He applied for disability 10 months ago, however, has not heard anything.  They have to provide a hearing within one year.  He states the Percocet controls his pain however, he has side effects from it.  It makes him feel loopy.  He would prefer to plain hydrocodone   Review of Systems    General musculoskeletal review of systems otherwise negative Objective:   Physical Exam Harold [name] menarche distress       Assessment & Plan:  Chronic low back pain.  Plan DC Percocet use the hydrocodone 7.5 specialists consult ASAP.

## 2010-11-16 ENCOUNTER — Other Ambulatory Visit: Payer: Self-pay | Admitting: *Deleted

## 2010-12-06 ENCOUNTER — Other Ambulatory Visit: Payer: Self-pay | Admitting: Family Medicine

## 2010-12-06 NOTE — Telephone Encounter (Signed)
no

## 2010-12-06 NOTE — Telephone Encounter (Addendum)
Pt has chronic back pain he had to increase taking vicodin to 5 pills a day. Needs early refill if possible [facility] [place] [phone]. Pt is out of med

## 2010-12-07 NOTE — Telephone Encounter (Signed)
patient  Is aware 

## 2010-12-08 ENCOUNTER — Emergency Department (HOSPITAL_COMMUNITY)
Admission: EM | Admit: 2010-12-08 | Discharge: 2010-12-08 | Disposition: A | Discharge status: Home / Self Care | Payer: Medicaid Other | Attending: Emergency Medicine | Admitting: Emergency Medicine

## 2010-12-08 DIAGNOSIS — G8929 Other chronic pain: Secondary | ICD-10-CM | POA: Insufficient documentation

## 2010-12-08 DIAGNOSIS — I10 Essential (primary) hypertension: Secondary | ICD-10-CM | POA: Insufficient documentation

## 2010-12-08 DIAGNOSIS — F3289 Other specified depressive episodes: Secondary | ICD-10-CM | POA: Insufficient documentation

## 2010-12-08 DIAGNOSIS — M545 Low back pain, unspecified: Secondary | ICD-10-CM | POA: Insufficient documentation

## 2010-12-08 DIAGNOSIS — K219 Gastro-esophageal reflux disease without esophagitis: Secondary | ICD-10-CM | POA: Insufficient documentation

## 2010-12-08 DIAGNOSIS — Z79899 Other long term (current) drug therapy: Secondary | ICD-10-CM | POA: Insufficient documentation

## 2010-12-08 DIAGNOSIS — E7800 Pure hypercholesterolemia, unspecified: Secondary | ICD-10-CM | POA: Insufficient documentation

## 2010-12-08 DIAGNOSIS — M79609 Pain in unspecified limb: Secondary | ICD-10-CM | POA: Insufficient documentation

## 2010-12-08 DIAGNOSIS — F329 Major depressive disorder, single episode, unspecified: Secondary | ICD-10-CM | POA: Insufficient documentation

## 2010-12-13 ENCOUNTER — Other Ambulatory Visit: Payer: Self-pay | Admitting: *Deleted

## 2010-12-14 ENCOUNTER — Other Ambulatory Visit (INDEPENDENT_AMBULATORY_CARE_PROVIDER_SITE_OTHER): Payer: Self-pay | Admitting: *Deleted

## 2010-12-14 DIAGNOSIS — E781 Pure hyperglyceridemia: Secondary | ICD-10-CM

## 2010-12-14 DIAGNOSIS — R609 Edema, unspecified: Secondary | ICD-10-CM

## 2010-12-14 LAB — BASIC METABOLIC PANEL
BUN: 16 mg/dL (ref 6–23)
CO2: 28 mEq/L (ref 19–32)
Calcium: 9.4 mg/dL (ref 8.4–10.5)
Chloride: 101 mEq/L (ref 96–112)
Creatinine, Ser: 1 mg/dL (ref 0.4–1.5)
GFR: 101.18 mL/min (ref >60.00)
Glucose, Bld: 99 mg/dL (ref 70–99)
Potassium: 4.2 mEq/L (ref 3.5–5.1)
Sodium: 138 mEq/L (ref 135–145)

## 2010-12-14 LAB — HEPATIC FUNCTION PANEL
ALT: 18 U/L (ref 0–53)
AST: 25 U/L (ref 0–37)
Albumin: 4.4 g/dL (ref 3.5–5.2)
Alkaline Phosphatase: 56 U/L (ref 39–117)
Bilirubin, Direct: 0 mg/dL (ref 0.0–0.3)
Total Bilirubin: 0.6 mg/dL (ref 0.3–1.2)
Total Protein: 7.4 g/dL (ref 6.0–8.3)

## 2010-12-14 LAB — LIPID PANEL
Cholesterol: 142 mg/dL (ref 0–200)
HDL: 47.2 mg/dL (ref >39.00)
LDL Cholesterol: 76 mg/dL (ref 0–99)
Total CHOL/HDL Ratio: 3
Triglycerides: 94 mg/dL (ref 0.0–149.0)
VLDL: 18.8 mg/dL (ref 0.0–40.0)

## 2010-12-16 ENCOUNTER — Ambulatory Visit: Payer: Self-pay

## 2011-01-11 ENCOUNTER — Ambulatory Visit (INDEPENDENT_AMBULATORY_CARE_PROVIDER_SITE_OTHER): Payer: Self-pay | Admitting: Cardiology

## 2011-01-11 ENCOUNTER — Encounter: Payer: Self-pay | Admitting: Cardiology

## 2011-01-11 VITALS — BP 141/88 | HR 77 | Ht 70.0 in | Wt 231.0 lb

## 2011-01-11 DIAGNOSIS — R609 Edema, unspecified: Secondary | ICD-10-CM

## 2011-01-11 DIAGNOSIS — E669 Obesity, unspecified: Secondary | ICD-10-CM

## 2011-01-11 DIAGNOSIS — I10 Essential (primary) hypertension: Secondary | ICD-10-CM

## 2011-01-11 DIAGNOSIS — I319 Disease of pericardium, unspecified: Secondary | ICD-10-CM

## 2011-01-11 MED ORDER — POTASSIUM CHLORIDE CRYS ER 10 MEQ PO TBCR: 10.0000 meq | EXTENDED_RELEASE_TABLET | Freq: Every day | ORAL | Status: DC

## 2011-01-11 MED ORDER — FUROSEMIDE 20 MG PO TABS: 20.0000 mg | ORAL_TABLET | Freq: Every day | ORAL | Status: DC

## 2011-01-11 NOTE — Progress Notes (Signed)
HPI The patient presents for followup of his difficult to control hypertension. At the last visit I decreased his Lasix and potassium.  He has done well with this.  I changed his lisinoprilHCT as well.  His blood pressure has been well controlled at home. The patient denies any new symptoms such as chest discomfort, neck or arm discomfort. There has been no new shortness of breath, PND or orthopnea. There have been no reported palpitations, presyncope or syncope. He has had a cold recently.  Allergies  Allergen Reactions  . Colchicine     REACTION: diarrhea  . Diphenhydramine Hcl     REACTION: itching    Current Outpatient Prescriptions  Medication Sig Dispense Refill  . citalopram (CELEXA) 40 MG tablet Take 20 mg by mouth daily.       . fenofibrate (TRICOR) 145 MG tablet Take 145 mg by mouth daily.        . furosemide (LASIX) 40 MG tablet Take 1 tablet (40 mg total) by mouth daily.  30 tablet  11  . HYDROcodone-acetaminophen (VICODIN ES) 7.5-750 MG per tablet Take 1 tablet by mouth every 6 (six) hours as needed for pain.  120 tablet  2  . lisinopril-hydrochlorothiazide (PRINZIDE,ZESTORETIC) 20-25 MG per tablet Take 1 tablet by mouth daily.  90 tablet  3  . metoprolol succinate (TOPROL-XL) 25 MG 24 hr tablet 12.5 mg daily       . Multiple Vitamins-Minerals (MEGA MULTIVITAMIN FOR MEN) TABS Take 500 mg by mouth 2 (two) times daily.        . potassium chloride SA (K-DUR,KLOR-CON) 20 MEQ tablet Take 1 tablet (20 mEq total) by mouth daily.  30 tablet  11  . rosuvastatin (CRESTOR) 10 MG tablet Take 10 mg by mouth daily.          Past Medical History  Diagnosis Date  . Obesity   . Metabolic syndrome   . Hypertension   . Depression   . GERD (gastroesophageal reflux disease)   . Chronic back pain   . History of pancreatitis     a. admx 04-2009.Marland KitchenMarland Kitchen? 2-2 triglycerides  . Hypertriglyceridemia     a. followed by [facility]  . Acute pericarditis     admx 03-26-10 thru 03-29-10; a. echo  03-26-10: EF 55-60%; mild LVH; trivial MR; RVF ok; mild -mod circumferential Eff w/o tamponade  . Constrictive pericarditis     Past Surgical History  Procedure Date  . Tonsillectomy and adenoidectomy   . Pericardectomy 07/15/2010    Dr. Tyrone Sage  . Pleurx catheter placement 07/15/2010    Dr. Tyrone Sage    ROS: As stated in the HPI and negative for all other systems.  PHYSICAL EXAM BP 141/88  Pulse 77  Ht 5\' 10"  (1.778 m)  Wt 231 lb (104.781 kg)  BMI 33.15 kg/m2 GENERAL:  Well appearing HEENT:  Pupils equal round and reactive, fundi not visualized, oral mucosa unremarkable NECK:  No JVD, waveform within normal limits, carotid upstroke brisk and symmetric, no bruits, no thyromegaly LYMPHATICS:  No cervical, inguinal adenopathy LUNGS: Clear BACK:  No CVA tenderness CHEST:  Well healed sternotomy scar HEART:  PMI not displaced or sustained,S1 and S2 within normal limits, no S3, no S4, no clicks, no rubs, no murmurs ABD:  Flat, positive bowel sounds normal in frequency in pitch, no bruits, no rebound, no guarding, no midline pulsatile mass, no hepatomegaly, no splenomegaly EXT:  2 plus pulses throughout, no edema, no cyanosis no clubbing SKIN:  No rashes  no nodules NEURO:  Cranial nerves II through XII grossly intact, motor grossly intact throughout PSYCH:  Cognitively intact, oriented to person place and time  ASSESSMENT AND PLAN

## 2011-01-11 NOTE — Assessment & Plan Note (Addendum)
He can decrease his Lasix further to 20 mg with a KCL of 10.  He will call in several weeks and we might be able to go down further on his meds.

## 2011-01-11 NOTE — Assessment & Plan Note (Signed)
The patient understands the need to lose weight with diet and exercise. We have discussed specific strategies for this.  

## 2011-01-11 NOTE — Assessment & Plan Note (Signed)
He is s/p pericardial stripping and no further imaging is needed.

## 2011-01-11 NOTE — Patient Instructions (Signed)
PLEASE RESCHEDULE YOUR APPOINTMENT FOR THE LIPID CLINIC  PLEASE HAVE FASTING LIPID AND LIVER PROFILE BEFORE YOU SEE THE LIPID CLINIC  DECREASE LASIX TO 20 MG A DAY DECREASE POTASSIUM CHLORIDE TO 10 MEQ A DAY CONTINUE ALL OTHER MEDICATIONS AS LISTED  Follow up in 6 MONTHS with Dr Antoine Poche.  You will receive a letter in the mail 2 months before you are due.  Please call us when you receive this letter to schedule your follow up appointment.

## 2011-01-13 ENCOUNTER — Other Ambulatory Visit: Payer: Self-pay | Admitting: *Deleted

## 2011-01-17 ENCOUNTER — Ambulatory Visit (INDEPENDENT_AMBULATORY_CARE_PROVIDER_SITE_OTHER): Payer: Self-pay

## 2011-01-17 VITALS — BP 132/78 | HR 66 | Wt 230.5 lb

## 2011-01-17 DIAGNOSIS — E781 Pure hyperglyceridemia: Secondary | ICD-10-CM

## 2011-01-17 NOTE — Patient Instructions (Signed)
1) Continue Crestor 10 mg daily and Tricor 145 mg daily. 2) Try to decrease the amount of chips you eat in a week and the amount of snacking you do in general. 3) Eat three meals a day as many days of the week as possible. 4) Limit fried foods. 5) Start incorporating physical activity into your regular routine.   Follow up with the lipid clinic in 3 months: Monday, January 28th at 1:30PM FASTING lab work at [facility]: Thursday, January 24th at 8:50AM

## 2011-01-17 NOTE — Progress Notes (Signed)
Mr. Kenley is a 45 year old African American male presenting to the lipid clinic for 6 month follow up of his dyslipidemia. Current cholesterol medications include Crestor 10 mg daily and Tricor 145 mg daily. Patient reports that he is compliant with his medications and has not had any issues tolerating them.   Diet: Patient has been conscious of his diet and does a good job overall. Says that his weakness is chips, and he reports eating one bag per week. He sometimes will snack instead of eating a meal. Skips meals 3 days a week (most commonly breakfast). Snacks at night when he can't sleep, usually on grapes or Jello. Breakfast consists of egg whites with bacon or sausage. Lunch is usually a [place] or chicken sandwich prepared at home with mustard and mayo. Will sometimes have a salad at lunch. Dinner is frequently seafood, prepared baked or grilled. Will have a side of macaroni and cheese, salad, or baked potato as well. Only eats one plate of food. Drinks water throughout the day and has an occasional Pepsi or sweet tea. Fried food 2x per week.  Exercise: No regular exercise. Patient states that he was cleared last Tuesday by his PCP to do regular exercise, and he is planning on incorporating that into his routine. He would like to lose 10 pounds by Thanksgiving.  Social History: Patient has a couple of drinks per week and quit smoking about a month and a half ago. Did not use a nicotine replacement product, but rather weaned himself off of cigarettes slowly.  Current Outpatient Prescriptions on File Prior to Visit  Medication Sig Dispense Refill  . citalopram (CELEXA) 40 MG tablet Take 20 mg by mouth daily.       . fenofibrate (TRICOR) 145 MG tablet Take 145 mg by mouth daily.        . furosemide (LASIX) 20 MG tablet Take 1 tablet (20 mg total) by mouth daily.  30 tablet  11  . HYDROcodone-acetaminophen (VICODIN ES) 7.5-750 MG per tablet Take 1 tablet by mouth every 6 (six) hours as needed for  pain.  120 tablet  2  . lisinopril-hydrochlorothiazide (PRINZIDE,ZESTORETIC) 20-25 MG per tablet Take 1 tablet by mouth daily.  90 tablet  3  . metoprolol succinate (TOPROL-XL) 25 MG 24 hr tablet 12.5 mg daily       . Multiple Vitamins-Minerals (MEGA MULTIVITAMIN FOR MEN) TABS Take 500 mg by mouth 2 (two) times daily.        . potassium chloride SA (K-DUR,KLOR-CON) 10 MEQ tablet Take 1 tablet (10 mEq total) by mouth daily.  30 tablet  11  . rosuvastatin (CRESTOR) 10 MG tablet Take 10 mg by mouth daily.

## 2011-01-17 NOTE — Assessment & Plan Note (Addendum)
Assessment: Dyslipidemia controlled on current medication regimen. Labs from 9/18: TC 142 (goal <200), TG 94 (goal <150), HDL  47 (goal >40), LDL 76 (goal <70). LFTs WNL.  Plan: Continue current medication regimen and lifestyle modifications. Congratulated the patient on smoking cessation. Provided with the instructions below. Patient will follow up in 3 months to reassess.   1) Continue Crestor 10 mg daily and Tricor 145 mg daily. 2) Try to decrease the amount of chips you eat in a week and the amount of snacking you do in general. 3) Eat three meals a day as many days of the week as possible. 4) Limit fried foods. 5) Start incorporating physical activity into your regular routine.

## 2011-01-27 ENCOUNTER — Encounter: Payer: Self-pay | Admitting: Family Medicine

## 2011-01-27 ENCOUNTER — Ambulatory Visit (INDEPENDENT_AMBULATORY_CARE_PROVIDER_SITE_OTHER): Payer: Self-pay | Admitting: Family Medicine

## 2011-01-27 VITALS — BP 170/100 | Temp 98.2°F | Wt 234.0 lb

## 2011-01-27 DIAGNOSIS — I10 Essential (primary) hypertension: Secondary | ICD-10-CM

## 2011-01-27 DIAGNOSIS — Z23 Encounter for immunization: Secondary | ICD-10-CM

## 2011-01-27 DIAGNOSIS — J309 Allergic rhinitis, unspecified: Secondary | ICD-10-CM

## 2011-01-27 DIAGNOSIS — G8929 Other chronic pain: Secondary | ICD-10-CM

## 2011-01-27 DIAGNOSIS — M549 Dorsalgia, unspecified: Secondary | ICD-10-CM

## 2011-01-27 MED ORDER — HYDROCODONE-ACETAMINOPHEN 7.5-750 MG PO TABS: 1.0000 | ORAL_TABLET | Freq: Four times a day (QID) | ORAL | Status: DC | PRN

## 2011-01-27 MED ORDER — HYDROCODONE-HOMATROPINE 5-1.5 MG/5ML PO SYRP: ORAL_SOLUTION | ORAL | Status: DC

## 2011-01-27 NOTE — Progress Notes (Signed)
Addended by: Kern Reap B on: 01/27/2011 05:44 PM   Modules accepted: Orders

## 2011-01-27 NOTE — Patient Instructions (Signed)
Drink lots of liquids.  Hydromet 0.5-teaspoon 3 times daily p.r.n. For cough and cold.  Return p.r.n.

## 2011-01-27 NOTE — Progress Notes (Signed)
  Subjective:    Patient ID: Harold [name], male    DOB: 05/09/1965, 45 y.o.   MRN: [id number]  HPI Harold [name] is a 45 year old male, nonsmoker, who comes in today for a cold for two weeks.  For the past two weeks has had head congestion, postnasal drip, mild cough.  No fever.  He has a history of hypertension and is on Lasix 20 mg daily, Zestoretic 20 -- 25 daily, Toprol 12.5 daily.  BP today is 170 over hundred, however, he says his blood pressures at home and at the cardiology office a couple weeks ago were normal 130/70 He's not been taking any over-the-counter cough or cold preparations Review of Systems General and pulmonary venous systems otherwise negative    Objective:   Physical Exam  Well-developed well-nourished, male in no acute distress.  Examination of the HEENT negative.  Neck was supple.  No adenopathy.  Lungs are clear      Assessment & Plan:  Viral syndrome.........[name] Drink lots of liquids........ Hydromet one half to 1 teaspoon nightly p.r.n.

## 2011-04-08 ENCOUNTER — Other Ambulatory Visit: Payer: Self-pay | Admitting: Cardiothoracic Surgery

## 2011-04-12 ENCOUNTER — Encounter: Payer: Self-pay | Admitting: Family Medicine

## 2011-04-12 ENCOUNTER — Ambulatory Visit (INDEPENDENT_AMBULATORY_CARE_PROVIDER_SITE_OTHER): Payer: Self-pay | Admitting: Family Medicine

## 2011-04-12 DIAGNOSIS — G8929 Other chronic pain: Secondary | ICD-10-CM

## 2011-04-12 DIAGNOSIS — F17200 Nicotine dependence, unspecified, uncomplicated: Secondary | ICD-10-CM

## 2011-04-12 DIAGNOSIS — E8881 Metabolic syndrome: Secondary | ICD-10-CM

## 2011-04-12 DIAGNOSIS — M79609 Pain in unspecified limb: Secondary | ICD-10-CM

## 2011-04-12 DIAGNOSIS — N390 Urinary tract infection, site not specified: Secondary | ICD-10-CM

## 2011-04-12 DIAGNOSIS — I10 Essential (primary) hypertension: Secondary | ICD-10-CM

## 2011-04-12 DIAGNOSIS — M549 Dorsalgia, unspecified: Secondary | ICD-10-CM

## 2011-04-12 DIAGNOSIS — Z720 Tobacco use: Secondary | ICD-10-CM

## 2011-04-12 LAB — POCT URINALYSIS DIPSTICK
Bilirubin, UA: NEGATIVE
Blood, UA: NEGATIVE
Glucose, UA: NEGATIVE
Ketones, UA: NEGATIVE
Leukocytes, UA: NEGATIVE
Nitrite, UA: NEGATIVE
Protein, UA: NEGATIVE
Spec Grav, UA: 1.025
Urobilinogen, UA: 0.2
pH, UA: 5.5

## 2011-04-12 MED ORDER — FENOFIBRATE 145 MG PO TABS: 145.0000 mg | ORAL_TABLET | Freq: Every day | ORAL | Status: DC

## 2011-04-12 MED ORDER — HYDROCODONE-ACETAMINOPHEN 7.5-750 MG PO TABS: 1.0000 | ORAL_TABLET | Freq: Four times a day (QID) | ORAL | Status: DC | PRN

## 2011-04-12 MED ORDER — CITALOPRAM HYDROBROMIDE 40 MG PO TABS: 20.0000 mg | ORAL_TABLET | Freq: Every day | ORAL | Status: DC

## 2011-04-12 NOTE — Patient Instructions (Signed)
second dose of the Zestoretic, tonight, then tomorrow resuming y normal medications.  Check y  blood pressure daily in the morning and return in one  Week  for follow-up..........Marland KitchenBring a record of all your blood pressure readings and the device when you return next week  I will call you when I get your lab work back

## 2011-04-12 NOTE — Progress Notes (Signed)
  Subjective:    Patient ID: Harold [name], male    DOB: 10-06-1965, 46 y.o.   MRN: [id number]  HPI Harold [name] is a 46 year old single male, who comes in today for evaluation of multiple problems per usual.  He has underlying hypertension, for which he takes Lasix 20 mg daily and Zestoretic 20 to 25 daily.  BP today 180 over hundred.  He states he checked his blood pressure at home week ago.  It was 135/85.  He attributes to his elevation in blood pressure to the, fact he's been traveling to the [facility] in the last 48 hours and just got back to [place].  He also complains of tingling in both feet for 5 months.  He states that he told this to Dr. h his cardiologist after he had a pericardial window and the felt it would resolve over time, however, is not.  He is not having trouble circulation.  He states when he rubs his feet.  It makes it feels better.   Review of Systems    General cardiovascular and neurologic review of systems otherwise negative Objective:   Physical Exam Obese male no acute distress.  Examination of the right arm shows a blood pressure be 190 over hundred, pulse 70 and regular.  Cardiovascular exam negative periphery shows the skin on the legs to be normal.  No edema.  Pulses normal.  No swelling or tenderness.  Normal range of motion.  No neurologic deficit       Assessment & Plan:  Hypertension not at goal.  Plan take a second dose of lisinopril tonight, then resume your normal doses tomorrow morning.  BP check.  Q.a.m. Follow-up in two weeks.  Check metabolic studies, including B12, and blood sugar to rule out a metabolic problem with the tingling in his feet

## 2011-04-13 LAB — BASIC METABOLIC PANEL
BUN: 11 mg/dL (ref 6–23)
CO2: 27 mEq/L (ref 19–32)
Calcium: 9.3 mg/dL (ref 8.4–10.5)
Chloride: 101 mEq/L (ref 96–112)
Creatinine, Ser: 1 mg/dL (ref 0.4–1.5)
GFR: 104.53 mL/min (ref >60.00)
Glucose, Bld: 110 mg/dL — ABNORMAL HIGH (ref 70–99)
Potassium: 4.1 mEq/L (ref 3.5–5.1)
Sodium: 138 mEq/L (ref 135–145)

## 2011-04-13 LAB — HEMOGLOBIN A1C: Hgb A1c MFr Bld: 6.3 % (ref 4.6–6.5)

## 2011-04-13 LAB — MICROALBUMIN / CREATININE URINE RATIO
Creatinine,U: 186.7 mg/dL
Microalb Creat Ratio: 0.9 mg/g (ref 0.0–30.0)
Microalb, Ur: 1.6 mg/dL (ref 0.0–1.9)

## 2011-04-14 LAB — DRUG SCREEN, URINE
Amphetamine Screen, Ur: NEGATIVE
Barbiturate Quant, Ur: NEGATIVE
Benzodiazepines.: NEGATIVE
Cocaine Metabolites: NEGATIVE
Creatinine,U: 186.76 mg/dL
Marijuana Metabolite: NEGATIVE
Methadone: NEGATIVE
Opiates: POSITIVE — AB
Phencyclidine (PCP): NEGATIVE
Propoxyphene: NEGATIVE

## 2011-04-14 LAB — VITAMIN B12: Vitamin B-12: 307 pg/mL (ref 211–911)

## 2011-04-21 ENCOUNTER — Ambulatory Visit (INDEPENDENT_AMBULATORY_CARE_PROVIDER_SITE_OTHER): Payer: Self-pay | Admitting: Family Medicine

## 2011-04-21 ENCOUNTER — Encounter: Payer: Self-pay | Admitting: Family Medicine

## 2011-04-21 ENCOUNTER — Other Ambulatory Visit: Payer: Self-pay | Admitting: *Deleted

## 2011-04-21 DIAGNOSIS — I10 Essential (primary) hypertension: Secondary | ICD-10-CM

## 2011-04-21 NOTE — Progress Notes (Signed)
  Subjective:    Patient ID: Harold [name], male    DOB: 1966-02-29, 46 y.o.   MRN: [id number]  HPI Harold [name] is a 46 year old male, who comes in today for follow-up of hypertension.  We saw him last week.  His blood pressure was elevated, however, he stated he been on a cross-country trip and felt that he did not want to change his medicine and that once he got a good night sleep.  His blood pressure would drop back to normal.  He comes in today for follow-up.  BP 120/70   Review of Systems    General and cardiovascular review of systems otherwise negative Objective:   Physical Exam Well-developed well-nourished, male in no acute distress       Assessment & Plan:  Hypertension,,,,,,,,,, normal,,,,,, continue current medication return p.r.n.

## 2011-04-21 NOTE — Patient Instructions (Signed)
Continue your current medication.  Check your blood pressure weekly.  Return p.r.n.

## 2011-04-25 ENCOUNTER — Ambulatory Visit: Payer: Self-pay

## 2011-04-27 ENCOUNTER — Other Ambulatory Visit: Payer: Self-pay | Admitting: *Deleted

## 2011-04-28 ENCOUNTER — Ambulatory Visit: Payer: Self-pay

## 2011-06-23 ENCOUNTER — Telehealth: Payer: Self-pay | Admitting: Family Medicine

## 2011-06-23 NOTE — Telephone Encounter (Signed)
No answer at pt phone numbers.

## 2011-06-23 NOTE — Telephone Encounter (Signed)
Patient called stating that he would like an appt for Monday as he has a pain in the right side of his neck. Please assist.

## 2011-06-27 ENCOUNTER — Encounter: Payer: Self-pay | Admitting: Family Medicine

## 2011-06-27 ENCOUNTER — Ambulatory Visit (INDEPENDENT_AMBULATORY_CARE_PROVIDER_SITE_OTHER): Payer: Self-pay | Admitting: Family Medicine

## 2011-06-27 VITALS — BP 160/90 | Temp 98.8°F | Wt 250.0 lb

## 2011-06-27 DIAGNOSIS — G8929 Other chronic pain: Secondary | ICD-10-CM

## 2011-06-27 DIAGNOSIS — I10 Essential (primary) hypertension: Secondary | ICD-10-CM

## 2011-06-27 DIAGNOSIS — M79609 Pain in unspecified limb: Secondary | ICD-10-CM

## 2011-06-27 DIAGNOSIS — M549 Dorsalgia, unspecified: Secondary | ICD-10-CM

## 2011-06-27 MED ORDER — HYDROCODONE-ACETAMINOPHEN 7.5-750 MG PO TABS: 1.0000 | ORAL_TABLET | Freq: Four times a day (QID) | ORAL | Status: DC | PRN

## 2011-06-27 MED ORDER — CYCLOBENZAPRINE HCL 5 MG PO TABS: 5.0000 mg | ORAL_TABLET | Freq: Three times a day (TID) | ORAL | Status: AC | PRN

## 2011-06-27 NOTE — Patient Instructions (Signed)
Take 5 mg of Flexeril at bedtime as needed for the muscle spasm in your neck  Check your blood pressure daily in the morning return in 3 weeks for followup with the data and the device

## 2011-06-27 NOTE — Progress Notes (Signed)
  Subjective:    Patient ID: Harold [name], male    DOB: 12-09-1965, 46 y.o.   MRN: [id number]  HPI Harold [name] is a 46 year old male states he's not a smoker but he smells like tobacco who comes in today for evaluation of right-sided neck pain  He states as to sit woke with right-sided neck pain it's a dull ache neurologic symptoms none. No history of trauma   Review of Systems    general review of systems negative except for elevated blood pressure 160/90 Objective:   Physical Exam  Well-developed well-nourished male in no acute distress examination the neck is normal full range of motion neurologic exam normal  BP 160/90 right arm sitting position      Assessment & Plan:  Cervical muscle spasm plan continue symptomatic therapy at Flexeril 5 mg at bedtime  Hypertension question goal plan BP check daily followup in 3 weeks

## 2011-07-04 ENCOUNTER — Other Ambulatory Visit (INDEPENDENT_AMBULATORY_CARE_PROVIDER_SITE_OTHER): Payer: Self-pay

## 2011-07-04 DIAGNOSIS — Z0271 Encounter for disability determination: Secondary | ICD-10-CM

## 2011-07-04 DIAGNOSIS — E781 Pure hyperglyceridemia: Secondary | ICD-10-CM

## 2011-07-05 ENCOUNTER — Ambulatory Visit (INDEPENDENT_AMBULATORY_CARE_PROVIDER_SITE_OTHER): Payer: Self-pay | Admitting: Pharmacist

## 2011-07-05 DIAGNOSIS — E781 Pure hyperglyceridemia: Secondary | ICD-10-CM

## 2011-07-05 LAB — HEPATIC FUNCTION PANEL
ALT: 19 U/L (ref 0–53)
AST: 29 U/L (ref 0–37)
Albumin: 4.5 g/dL (ref 3.5–5.2)
Alkaline Phosphatase: 59 U/L (ref 39–117)
Bilirubin, Direct: 0.1 mg/dL (ref 0.0–0.3)
Total Bilirubin: 0.5 mg/dL (ref 0.3–1.2)
Total Protein: 7.3 g/dL (ref 6.0–8.3)

## 2011-07-05 LAB — LIPID PANEL
Cholesterol: 145 mg/dL (ref 0–200)
HDL: 43.5 mg/dL (ref >39.00)
Total CHOL/HDL Ratio: 3
Triglycerides: 306 mg/dL — ABNORMAL HIGH (ref 0.0–149.0)
VLDL: 61.2 mg/dL — ABNORMAL HIGH (ref 0.0–40.0)

## 2011-07-05 LAB — LDL CHOLESTEROL, DIRECT: Direct LDL: 58.2 mg/dL

## 2011-07-05 NOTE — Progress Notes (Signed)
Subjective -  Harold [name] presented to the lipid clinic today for follow up of his dyslipidemia assisted by a cane and drowsy from his medications.  His last visit was in Oct 2012. He continue on Crestor 10 mg daily (samples from Dr. Tawanna Cooler) and Tricor 145 mg daily without complaints of side effects.  Due to lack of insurance or employment, he is not able to afford medications that are not on the $4 generic list.  Subsequently, he ran out of his Tricor samples about two weeks ago.  He is actively pursuing disability and needs to reapply for the [employer].  Diet: Patient continues to watch what he eats, and tries to limit fried foods and red meat.  Exercise: Due to significant chronic back pain and disability, he is unable to tolerate any physical activity.  Anticipates need for surgery in the near future.  Lipid Panel     Component Value Date/Time   CHOL 145 07/04/2011 1503   TRIG 306.0* 07/04/2011 1503   HDL 43.50 07/04/2011 1503   CHOLHDL 3 07/04/2011 1503   VLDL 61.2* 07/04/2011 1503   LDLCALC 76 12/14/2010 1117    Current Outpatient Prescriptions on File Prior to Visit  Medication Sig Dispense Refill  . cetirizine (ZYRTEC) 10 MG tablet Take 10 mg by mouth daily.      . citalopram (CELEXA) 40 MG tablet Take 0.5 tablets (20 mg total) by mouth daily.  100 tablet  3  . cyclobenzaprine (FLEXERIL) 5 MG tablet Take 1 tablet (5 mg total) by mouth 3 (three) times daily as needed for muscle spasms.  30 tablet  2  . fenofibrate (TRICOR) 145 MG tablet Take 1 tablet (145 mg total) by mouth daily.  100 tablet  3  . furosemide (LASIX) 20 MG tablet Take 1 tablet (20 mg total) by mouth daily.  30 tablet  11  . HYDROcodone-acetaminophen (VICODIN ES) 7.5-750 MG per tablet Take 1 tablet by mouth every 6 (six) hours as needed for pain.  120 tablet  2  . lisinopril-hydrochlorothiazide (PRINZIDE,ZESTORETIC) 20-25 MG per tablet Take 1 tablet by mouth daily.  90 tablet  3  . Multiple Vitamins-Minerals (MEGA MULTIVITAMIN  FOR MEN) TABS Take 500 mg by mouth 2 (two) times daily.        . potassium chloride SA (K-DUR,KLOR-CON) 10 MEQ tablet Take 1 tablet (10 mEq total) by mouth daily.  30 tablet  11  . ranitidine (ZANTAC) 150 MG tablet Take 150 mg by mouth daily.      . rosuvastatin (CRESTOR) 10 MG tablet Take 10 mg by mouth daily.

## 2011-07-05 NOTE — Assessment & Plan Note (Signed)
CV Risk Assessment:  Risk Factors: HTN, sedentary lifestyle TC goal <200, HDL goal >40, LDL goal <100, TG goal <150  Recommendations/Plan:  1. Continue current medication regimen and lifestyle modifications.  Gave patient Trilipix samples. 2. Supply verification of income to apply for company-supplied medication (MAP). 3. Return for follow-up with Dr. Antoine Poche on May 7th

## 2011-07-18 ENCOUNTER — Ambulatory Visit: Payer: Self-pay | Admitting: Family Medicine

## 2011-07-18 ENCOUNTER — Ambulatory Visit: Payer: Self-pay | Admitting: Family

## 2011-07-19 ENCOUNTER — Telehealth: Payer: Self-pay | Admitting: Cardiology

## 2011-07-19 NOTE — Telephone Encounter (Signed)
Spoke with pt and Victorino Sparrow who state they didn't call about paperwork.  I think this message was taken on the incorrect pt.  Will check with person who took message to try to figure this out.

## 2011-07-19 NOTE — Telephone Encounter (Signed)
Call number left and was told they didn't know who Harold [name] is.  Will attempt to contact pt

## 2011-07-19 NOTE — Telephone Encounter (Signed)
Please return call to patient daughter  Dravyn, [place] [phone]  Regarding medical source statement for patient

## 2011-07-20 NOTE — Telephone Encounter (Incomplete)
Paperwork found for this patient, was initially  logged under different Harold [name]. [name] is an Geophysicist/field seismologist to [employer]. Kurtis Bushman representing this patient in a Social Security disability claim. They have forwarded a Medical Source Statement to Dr. Antoine Poche for completion asking for a one day turnaround for a scheduled hearing on April 25th. I called the Law office and spoke to Miss Mordecai Maes informing her of our 7-10 business day turnaround period. She agreed the our policy and will use the medical records she received from [facility] for her immediate needs.

## 2011-07-26 ENCOUNTER — Encounter: Payer: Self-pay | Admitting: Family Medicine

## 2011-07-26 ENCOUNTER — Ambulatory Visit (INDEPENDENT_AMBULATORY_CARE_PROVIDER_SITE_OTHER): Payer: Self-pay | Admitting: Family Medicine

## 2011-07-26 VITALS — BP 140/90 | Temp 97.9°F | Wt 250.0 lb

## 2011-07-26 DIAGNOSIS — I10 Essential (primary) hypertension: Secondary | ICD-10-CM

## 2011-07-26 DIAGNOSIS — K649 Unspecified hemorrhoids: Secondary | ICD-10-CM | POA: Insufficient documentation

## 2011-07-26 MED ORDER — HYDROCORTISONE ACETATE 25 MG RE SUPP: 25.0000 mg | Freq: Two times a day (BID) | RECTAL | Status: AC

## 2011-07-26 NOTE — Patient Instructions (Signed)
Continue your current medication  Call us when you get your disability insurance effective date so we can then do the orthopedic and neurologic evaluations to help determine what can be done to help your pain

## 2011-07-26 NOTE — Progress Notes (Signed)
  Subjective:    Patient ID: Harold [name], male    DOB: 02/04/1966, 46 y.o.   MRN: [id number]  HPI Harold [name] is a 46 year old male who comes in today for evaluation of multiple issues  He's currently on Zestoretic 20-25 daily for hypertension BP 140/90 he states his blood pressure at home averages 130/80  He also states he got some good news last Thursday his disability was approved. Now once that kickstand will be able to send him for the neurologic and orthopedic evaluations  He also states in an episode on Saturday of midepigastric abdominal pain that lasted for about 5-6 hours and went away. He had no fever chills nausea vomiting or diarrhea. That day was his L. abrasion and he had a few alcoholic beverages. He has a history of pancreatitis in the past and again was advised not to drink any alcohol  He continues to have trouble with internal hemorrhoids and has been using Preparation H with no success   Review of Systems Gen. cardiovascular review of systems GI review of systems otherwise negative    Objective:   Physical Exam  Well-developed well-nourished male in no acute distress BP right arm sitting position 140/90 pulse 70 and regular      Assessment & Plan:  Hypertension at goal continue current therapy  Internal hemorrhoids diet exercise weight loss Anusol-HC suppositories GI consult if symptoms persist  Abdominal pain probably secondary to a mild episode of pancreatitis again advised no alcohol  History of orthopedic and neurologic problems that we will address once he gets his disability insurance

## 2011-08-02 ENCOUNTER — Encounter: Payer: Self-pay | Admitting: Cardiology

## 2011-08-02 ENCOUNTER — Ambulatory Visit (INDEPENDENT_AMBULATORY_CARE_PROVIDER_SITE_OTHER): Payer: Self-pay | Admitting: Cardiology

## 2011-08-02 VITALS — BP 170/120 | HR 65 | Ht 70.0 in | Wt 249.1 lb

## 2011-08-02 DIAGNOSIS — E669 Obesity, unspecified: Secondary | ICD-10-CM

## 2011-08-02 DIAGNOSIS — I509 Heart failure, unspecified: Secondary | ICD-10-CM

## 2011-08-02 DIAGNOSIS — I5022 Chronic systolic (congestive) heart failure: Secondary | ICD-10-CM

## 2011-08-02 NOTE — Assessment & Plan Note (Signed)
Since he did better on the Lasix I will start this again at 20 mg with potassium 10 mEq daily. He will keep a blood pressure diary. I will get a basic metabolic profile in about 10 days.

## 2011-08-02 NOTE — Patient Instructions (Signed)
Please start Lasix 20 mg and Potassium 10 MEQ a day Continue all other medications as listed  Please have blood work in 10 days  Follow up in 6 months with Dr Antoine Poche.  You will receive a letter in the mail 2 months before you are due.  Please call us when you receive this letter to schedule your follow up appointment.

## 2011-08-02 NOTE — Progress Notes (Signed)
HPI The patient presents for followup of his difficult to control hypertension. Since stopping his Lasix his blood pressure has actually been too high. He denies any new symptoms. He's been breathing well and has no PND or orthopnea. He has no palpitations, presyncope or syncope. He has no chest pressure, neck or arm discomfort. He's somewhat limited by heel spurs and back pain.  Allergies  Allergen Reactions  . Colchicine     REACTION: diarrhea  . Diphenhydramine Hcl     REACTION: itching    Current Outpatient Prescriptions  Medication Sig Dispense Refill  . cetirizine (ZYRTEC) 10 MG tablet Take 10 mg by mouth daily.      . citalopram (CELEXA) 40 MG tablet Take 0.5 tablets (20 mg total) by mouth daily.  100 tablet  3  . cyclobenzaprine (FLEXERIL) 10 MG tablet Take 10 mg by mouth at bedtime.      . fenofibrate (TRICOR) 145 MG tablet Take 1 tablet (145 mg total) by mouth daily.  100 tablet  3  . HYDROcodone-acetaminophen (VICODIN ES) 7.5-750 MG per tablet Take 1 tablet by mouth every 6 (six) hours as needed for pain.  120 tablet  2  . hydrocortisone (ANUSOL-HC) 25 MG suppository Place 1 suppository (25 mg total) rectally 2 (two) times daily.  12 suppository  1  . lisinopril-hydrochlorothiazide (PRINZIDE,ZESTORETIC) 20-25 MG per tablet Take 1 tablet by mouth daily.  90 tablet  3  . Multiple Vitamins-Minerals (MEGA MULTIVITAMIN FOR MEN) TABS Take 500 mg by mouth 2 (two) times daily.        . ranitidine (ZANTAC) 150 MG tablet Take 150 mg by mouth daily.      . rosuvastatin (CRESTOR) 10 MG tablet Take 10 mg by mouth daily.          Past Medical History  Diagnosis Date  . Obesity   . Metabolic syndrome   . Hypertension   . Depression   . GERD (gastroesophageal reflux disease)   . Chronic back pain   . History of pancreatitis     a. admx 04-2009.Marland KitchenMarland Kitchen? 2-2 triglycerides  . Hypertriglyceridemia     a. followed by [facility]  . Acute pericarditis     admx 03-26-10 thru 03-29-10; a.  echo 03-26-10: EF 55-60%; mild LVH; trivial MR; RVF ok; mild -mod circumferential Eff w/o tamponade  . Constrictive pericarditis     Past Surgical History  Procedure Date  . Tonsillectomy and adenoidectomy   . Pericardectomy 07/15/2010    Dr. Tyrone Sage  . Pleurx catheter placement 07/15/2010    Dr. Tyrone Sage    ROS: As stated in the HPI and negative for all other systems.  PHYSICAL EXAM BP 170/120  Pulse 65  Ht 5\' 10"  (1.778 m)  Wt 249 lb 1.9 oz (113 kg)  BMI 35.74 kg/m2 GENERAL:  Well appearing HEENT:  Pupils equal round and reactive, fundi not visualized, oral mucosa unremarkable NECK:  No JVD, waveform within normal limits, carotid upstroke brisk and symmetric, no bruits, no thyromegaly LYMPHATICS:  No cervical, inguinal adenopathy LUNGS: Clear BACK:  No CVA tenderness CHEST:  Well healed sternotomy scar HEART:  PMI not displaced or sustained,S1 and S2 within normal limits, no S3, no S4, no clicks, no rubs, no murmurs ABD:  Flat, positive bowel sounds normal in frequency in pitch, no bruits, no rebound, no guarding, no midline pulsatile mass, no hepatomegaly, no splenomegaly EXT:  2 plus pulses throughout, no edema, no cyanosis no clubbing SKIN:  No rashes no  nodules NEURO:  Cranial nerves II through XII grossly intact, motor grossly intact throughout [facility]:  Cognitively intact, oriented to person place and time  EKG:  Sinus rhythm, rate 65, right axis deviation, intervals within normal limits, nonspecific inferior T-wave changes 08/02/2011  ASSESSMENT AND PLAN

## 2011-08-02 NOTE — Assessment & Plan Note (Signed)
He is no longer bothered by this issue. No change in therapy is specifically indicated for this. No further imaging is indicated.

## 2011-08-02 NOTE — Assessment & Plan Note (Signed)
We had a discussion about weight loss with diet and exercise.

## 2011-08-12 ENCOUNTER — Other Ambulatory Visit: Payer: Self-pay

## 2011-08-16 ENCOUNTER — Other Ambulatory Visit: Payer: Self-pay | Admitting: *Deleted

## 2011-08-16 ENCOUNTER — Other Ambulatory Visit: Payer: Self-pay | Admitting: Cardiology

## 2011-08-16 DIAGNOSIS — I10 Essential (primary) hypertension: Secondary | ICD-10-CM

## 2011-08-16 MED ORDER — FUROSEMIDE 20 MG PO TABS: 20.0000 mg | ORAL_TABLET | Freq: Every day | ORAL | Status: DC

## 2011-08-16 MED ORDER — LISINOPRIL-HYDROCHLOROTHIAZIDE 20-25 MG PO TABS: 1.0000 | ORAL_TABLET | Freq: Every day | ORAL | Status: DC

## 2011-08-16 MED ORDER — POTASSIUM CHLORIDE ER 10 MEQ PO TBCR: 10.0000 meq | EXTENDED_RELEASE_TABLET | Freq: Every day | ORAL | Status: DC

## 2011-08-30 ENCOUNTER — Encounter: Payer: Self-pay | Admitting: Gastroenterology

## 2011-09-16 ENCOUNTER — Ambulatory Visit (INDEPENDENT_AMBULATORY_CARE_PROVIDER_SITE_OTHER): Payer: Medicaid Other | Admitting: Internal Medicine

## 2011-09-16 ENCOUNTER — Ambulatory Visit: Payer: Self-pay | Admitting: Internal Medicine

## 2011-09-16 ENCOUNTER — Encounter: Payer: Self-pay | Admitting: Internal Medicine

## 2011-09-16 VITALS — BP 170/110 | Temp 98.3°F | Wt 250.0 lb

## 2011-09-16 DIAGNOSIS — E8881 Metabolic syndrome: Secondary | ICD-10-CM

## 2011-09-16 DIAGNOSIS — G8929 Other chronic pain: Secondary | ICD-10-CM

## 2011-09-16 DIAGNOSIS — I10 Essential (primary) hypertension: Secondary | ICD-10-CM

## 2011-09-16 DIAGNOSIS — M549 Dorsalgia, unspecified: Secondary | ICD-10-CM

## 2011-09-16 DIAGNOSIS — M79609 Pain in unspecified limb: Secondary | ICD-10-CM

## 2011-09-16 DIAGNOSIS — E781 Pure hyperglyceridemia: Secondary | ICD-10-CM

## 2011-09-16 DIAGNOSIS — I311 Chronic constrictive pericarditis: Secondary | ICD-10-CM

## 2011-09-16 MED ORDER — HYDROCODONE-ACETAMINOPHEN 7.5-750 MG PO TABS: 1.0000 | ORAL_TABLET | Freq: Four times a day (QID) | ORAL | Status: DC | PRN

## 2011-09-16 MED ORDER — FENOFIBRATE 145 MG PO TABS: 145.0000 mg | ORAL_TABLET | Freq: Every day | ORAL | Status: DC

## 2011-09-16 MED ORDER — PREDNISONE 10 MG PO TABS: 10.0000 mg | ORAL_TABLET | Freq: Two times a day (BID) | ORAL | Status: DC

## 2011-09-16 NOTE — Progress Notes (Signed)
  Subjective:    Patient ID: Harold [name], male    DOB: 12/12/1965, 46 y.o.   MRN: [id number]  HPI  46 year old patient who is seen today complaining of right great toe pain. His problem list includes a history of gout but the patient states this was a misdiagnosis and he has had intermittent pain do to a bone spur. No x-rays for review. He has chronic back and foot pain which has worsened over the past week. He does use Vicodin for pain. He has a complex past medical history the includes multi-drug-resistant hypertension and also a history of idiopathic pericarditis. This was compensated by constriction and he is status post pericardectomy. Except as toe pain he seems to be doing well. Furosemide was resumed at his last visit with cardiology and he claims compliance. He is on medications for dyslipidemia.    Review of Systems  Constitutional: Negative for fever, chills, appetite change and fatigue.  HENT: Negative for hearing loss, ear pain, congestion, sore throat, trouble swallowing, neck stiffness, dental problem, voice change and tinnitus.   Eyes: Negative for pain, discharge and visual disturbance.  Respiratory: Negative for cough, chest tightness, wheezing and stridor.   Cardiovascular: Negative for chest pain, palpitations and leg swelling.  Gastrointestinal: Negative for nausea, vomiting, abdominal pain, diarrhea, constipation, blood in stool and abdominal distention.  Genitourinary: Negative for urgency, hematuria, flank pain, discharge, difficulty urinating and genital sores.  Musculoskeletal: Positive for joint swelling, arthralgias and gait problem. Negative for myalgias and back pain.  Skin: Negative for rash.  Neurological: Negative for dizziness, syncope, speech difficulty, weakness, numbness and headaches.  Hematological: Negative for adenopathy. Does not bruise/bleed easily.  Psychiatric/Behavioral: Negative for behavioral problems and dysphoric mood. The patient is not  nervous/anxious.        Objective:   Physical Exam  Constitutional: He appears well-developed and well-nourished. No distress.       Obese. No acute distress. Repeat blood pressure 150/98  Neck: Normal range of motion. No JVD present.  Cardiovascular: Normal rate, regular rhythm, normal heart sounds and intact distal pulses.   Pulmonary/Chest: Effort normal and breath sounds normal. He has no rales.  Musculoskeletal:       Examination the right great toe did reveal some mild soft tissue swelling about the first MTP joint There was tenderness to palpation          Assessment & Plan:   Right great toe pain. History of gout. Clinical exam is fairly consistent with gout or possible bursitis. Will treat with short-term oral prednisone and refill analgesics Hypertension poor control. We'll recheck in 2 weeks History of constrictive pericarditis stable

## 2011-09-16 NOTE — Patient Instructions (Signed)
Limit your sodium (Salt) intake  Please check your blood pressure on a regular basis.  If it is consistently greater than 150/90, please make an office appointment.  You need to lose weight.  Consider a lower calorie diet and regular exercise. 

## 2011-09-27 ENCOUNTER — Ambulatory Visit: Payer: Self-pay | Admitting: Gastroenterology

## 2011-10-07 ENCOUNTER — Telehealth: Payer: Self-pay | Admitting: Family Medicine

## 2011-10-07 NOTE — Telephone Encounter (Signed)
Left message for patient to return our call.

## 2011-10-07 NOTE — Telephone Encounter (Signed)
Pt scheduled for an appt 7/17 for a blood pressure follow up and would like to be worked in sooner. Please advise

## 2011-10-12 ENCOUNTER — Ambulatory Visit (INDEPENDENT_AMBULATORY_CARE_PROVIDER_SITE_OTHER): Payer: Medicaid Other | Admitting: Family Medicine

## 2011-10-12 ENCOUNTER — Encounter: Payer: Self-pay | Admitting: Family Medicine

## 2011-10-12 VITALS — BP 140/90 | Temp 98.4°F | Wt 252.0 lb

## 2011-10-12 DIAGNOSIS — M549 Dorsalgia, unspecified: Secondary | ICD-10-CM

## 2011-10-12 DIAGNOSIS — M79609 Pain in unspecified limb: Secondary | ICD-10-CM

## 2011-10-12 DIAGNOSIS — G8929 Other chronic pain: Secondary | ICD-10-CM

## 2011-10-12 DIAGNOSIS — I10 Essential (primary) hypertension: Secondary | ICD-10-CM

## 2011-10-12 MED ORDER — HYDROCODONE-ACETAMINOPHEN 7.5-750 MG PO TABS: 1.0000 | ORAL_TABLET | Freq: Four times a day (QID) | ORAL | Status: DC | PRN

## 2011-10-12 MED ORDER — LISINOPRIL 40 MG PO TABS: 40.0000 mg | ORAL_TABLET | Freq: Every day | ORAL | Status: DC

## 2011-10-12 NOTE — Progress Notes (Signed)
  Subjective:    Patient ID: Harold [name], male    DOB: 07/25/1965, 46 y.o.   MRN: [id number]  HPI Harold [name] is a 46 year old male who comes in today for evaluation of multiple issues  He has a history of hypertension and is on Zestoretic 20/25 daily along with 20 mg of Lasix and 110 mEq potassium supplement daily. BP 140/90  He's finally going back to school at [employer] studying computer technology  He continues to have pain in his foot from the bone spur and now that he has insurance he would like to see somebody about getting that fixed. I recommend Dr. Toni Arthurs  He continues the Vicodin 1 tablet 4 times daily when necessary for chronic back and hip pain   Review of Systems    general and musculoskeletal review of systems otherwise negative Objective:   Physical Exam Well-developed well-nourished male in acute distress. Weight still elevated 252,,,,,,,,,,, he played football page 150,,,,,,,,,,,,,  BP right arm sitting position 140/90     Assessment & Plan:  Hypertension not at goal DCD Zestoretic start lisinopril 40 mg daily continue Lasix and potassium followup in one month  Chronic pain refill medication  Bone spur foot refer to Dr. Toni Arthurs

## 2011-10-12 NOTE — Patient Instructions (Signed)
Stop the Zestoretic  Start lisinopril 40 mg daily in the morning and continue the Lasix and potassium one of each daily  Check your blood pressure daily in the morning  Return in 3 weeks for followup  Call [facility] and asked to see Dr. Toni Arthurs   for evaluation of your foot

## 2011-10-14 ENCOUNTER — Ambulatory Visit (INDEPENDENT_AMBULATORY_CARE_PROVIDER_SITE_OTHER): Payer: Medicaid Other | Admitting: Gastroenterology

## 2011-10-14 ENCOUNTER — Encounter: Payer: Self-pay | Admitting: Gastroenterology

## 2011-10-14 ENCOUNTER — Other Ambulatory Visit: Payer: Self-pay | Admitting: *Deleted

## 2011-10-14 VITALS — BP 114/60 | HR 68 | Ht 71.0 in | Wt 251.1 lb

## 2011-10-14 DIAGNOSIS — K219 Gastro-esophageal reflux disease without esophagitis: Secondary | ICD-10-CM

## 2011-10-14 DIAGNOSIS — I10 Essential (primary) hypertension: Secondary | ICD-10-CM

## 2011-10-14 DIAGNOSIS — E739 Lactose intolerance, unspecified: Secondary | ICD-10-CM

## 2011-10-14 DIAGNOSIS — E785 Hyperlipidemia, unspecified: Secondary | ICD-10-CM

## 2011-10-14 DIAGNOSIS — Z800 Family history of malignant neoplasm of digestive organs: Secondary | ICD-10-CM

## 2011-10-14 DIAGNOSIS — G8929 Other chronic pain: Secondary | ICD-10-CM

## 2011-10-14 DIAGNOSIS — Z8719 Personal history of other diseases of the digestive system: Secondary | ICD-10-CM

## 2011-10-14 DIAGNOSIS — M549 Dorsalgia, unspecified: Secondary | ICD-10-CM

## 2011-10-14 DIAGNOSIS — Z86010 Personal history of colonic polyps: Secondary | ICD-10-CM

## 2011-10-14 MED ORDER — MOVIPREP 100 G PO SOLR: 1.0000 | Freq: Once | ORAL | Status: DC

## 2011-10-14 MED ORDER — COLESEVELAM HCL 625 MG PO TABS: ORAL_TABLET | ORAL | Status: DC

## 2011-10-14 MED ORDER — VARENICLINE TARTRATE 0.5 MG PO TABS: 0.5000 mg | ORAL_TABLET | Freq: Every day | ORAL | Status: DC

## 2011-10-14 MED ORDER — LISINOPRIL 40 MG PO TABS: 40.0000 mg | ORAL_TABLET | Freq: Every day | ORAL | Status: DC

## 2011-10-14 NOTE — Progress Notes (Signed)
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 He also has a history of hypertension with previous CHF which apparently has resolved with Lasix therapy. I saw him in 2005 because of colon cancer in his father at age 42. He had an adenomatous polyp removed, but has not had followup since that time. The patient will occasionally have bright red blood per rectum with straining, and does have a lot of abdominal gas, bloating, and has a history of lactose intolerance. He has had no anorexia, weight loss, specific hepatobiliary complaints, but does have chronic GERD with when necessary Zantac use. There is no history of solid or liquid food dysphagia, melena, NSAID or cigarette abuse. Apparently he still drinks in moderation. His wife is with him throughout her interview and exam.  I have reviewed this patient's present history, medical and surgical past history, allergies and medications.     ROS: The remainder of the 10 point ROS is negative     Physical Exam: Blood pressure 114/60, pulse 60 and regular, weight 251 pounds the BMI of 35.02. General well developed well nourished patient in no acute distress, appearing their stated age Eyes PERRLA, no icterus, fundoscopic exam per opthamologist Skin no lesions noted Neck supple, no adenopathy, no thyroid enlargement, no tenderness Chest clear to percussion and auscultation Heart no significant murmurs, gallops or rubs noted Abdomen no hepatosplenomegaly masses or tenderness, BS normal.  Extremities no acute joint lesions, edema, phlebitis or evidence of cellulitis. Neurologic patient oriented x 3, cranial nerves intact, no focal neurologic deficits noted. Psychological mental status normal and normal affect.  Assessment and plan: Lactose intolerance with chronic abdominal gas and  bloating, chronic poorly treated GERD, and a family history of colon cancer. I have placed this patient on a lactose-free diet, and have scheduled followup endoscopy and colonoscopy, and also will obtain small bowel biopsy. Additionally, have supplied I list of gaseous producing dietary products to avoid. Otherwise he is to continue his medications as listed and reviewed per primary care. Please copy Dr. Kelle Darting.  Encounter Diagnoses  Name Primary?  . Personal history of colonic polyps Yes  . Esophageal reflux

## 2011-10-14 NOTE — Patient Instructions (Addendum)
You have been given a separate informational sheet regarding your tobacco use, the importance of quitting and local resources to help you quit.  You have been scheduled for an endoscopy and colonoscopy with propofol. Please follow the written instructions given to you at your visit today. Please pick up your prep at the pharmacy within the next 1-3 days. If you use inhalers (even only as needed), please bring them with you on the day of your procedure.  You have been given information on Artifical Sweeteners and see below on Lactose Intolerance.   Lactose Intolerance, Adult Lactose intolerance is when the body is not able to digest lactose, a sugar found in milk and milk products. Lactose intolerance is caused by your body not producing enough of the enzyme lactase. When there is not enough lactase to digest the amount of lactose consumed, discomfort may be felt. Lactose intolerance is not a milk allergy. For most people, lactase deficiency is a condition that develops naturally over time. After about the age of 2, the body begins to produce less lactase. But many people may not experience symptoms until they are much older. CAUSES Things that can cause you to be lactose intolerant include:  Aging.   Being born without the ability to make lactase.   Certain digestive diseases.   Injuries to the small intestine.  SYMPTOMS   Feeling sick to your stomach (nauseous).   Diarrhea.   Cramps.   Bloating.   Gas.  Symptoms usually show up a half hour or 2 hours after eating or drinking products containing lactose. TREATMENT  No treatment can improve the body's ability to produce lactase. However, symptoms can be controlled through diet. A medicine may be given to you to take when you consume lactose-containing foods or drinks. The medicine contains the lactase enzyme, which help the body digest lactose better. HOME CARE INSTRUCTIONS  Eat or drink dairy products as told by your caregiver or  dietician.   Take all medicine as directed by your caregiver.   Find lactose-free or lactose-reduced products at your local grocery store.   Talk to your caregiver or dietician to decide if you need any dietary supplements.  The following is the amount of calcium needed from the diet:  19 to 50 years: 1000 mg   Over 50 years: 1200 mg  Calcium and Lactose in Common Foods Non-Dairy Products / Calcium Content (mg)  Calcium-fortified orange juice, 1 cup / 308 to 344 mg   Sardines, with edible bones, 3 oz / 270 mg   Salmon, canned, with edible bones, 3 oz / 205 mg   Soymilk, fortified, 1 cup / 200 mg   Broccoli (raw), 1 cup / 90 mg   Orange, 1 medium / 50 mg   Pinto beans,  cup / 40 mg   Tuna, canned, 3 oz / 10 mg   Lettuce greens,  cup / 10 mg  Dairy Products / Calcium Content (mg) / Lactose Content (g)  Yogurt, plain, low-fat, 1 cup / 415 mg / 5 g   Milk, reduced fat, 1 cup / 295 mg / 11 g   Swiss cheese, 1 oz / 270 mg / 1 g   Ice cream,  cup / 85 mg / 6 g   Cottage cheese,  cup / 75 mg / 2 to 3 g  SEEK MEDICAL CARE IF: You have no relief from your symptoms. Document Released: 03/14/2005 Document Revised: 03/03/2011 Document Reviewed: 06/11/2010 [facility] Patient Information 2012 [place], [place].  cc: Kelle Darting, MD

## 2011-10-17 ENCOUNTER — Encounter: Payer: Self-pay | Admitting: Gastroenterology

## 2011-10-17 ENCOUNTER — Ambulatory Visit (AMBULATORY_SURGERY_CENTER): Payer: Medicaid Other | Admitting: Gastroenterology

## 2011-10-17 ENCOUNTER — Other Ambulatory Visit: Payer: Self-pay

## 2011-10-17 VITALS — BP 143/98 | HR 64 | Temp 96.2°F | Resp 14 | Ht 71.0 in | Wt 257.0 lb

## 2011-10-17 DIAGNOSIS — D1330 Benign neoplasm of unspecified part of small intestine: Secondary | ICD-10-CM

## 2011-10-17 DIAGNOSIS — R197 Diarrhea, unspecified: Secondary | ICD-10-CM

## 2011-10-17 DIAGNOSIS — Z800 Family history of malignant neoplasm of digestive organs: Secondary | ICD-10-CM

## 2011-10-17 DIAGNOSIS — K259 Gastric ulcer, unspecified as acute or chronic, without hemorrhage or perforation: Secondary | ICD-10-CM

## 2011-10-17 DIAGNOSIS — B3781 Candidal esophagitis: Secondary | ICD-10-CM

## 2011-10-17 MED ORDER — ESOMEPRAZOLE MAGNESIUM 40 MG PO PACK: 40.0000 mg | PACK | Freq: Every day | ORAL | Status: DC

## 2011-10-17 MED ORDER — SODIUM CHLORIDE 0.9 % IV SOLN: 500.0000 mL | INTRAVENOUS | Status: DC

## 2011-10-17 MED ORDER — FLUCONAZOLE 100 MG PO TABS: ORAL_TABLET | ORAL | Status: DC

## 2011-10-17 NOTE — Op Note (Signed)
[facility] [address] [place], [place]  [address]  COLONOSCOPY PROCEDURE REPORT  PATIENT:  Harold [name], Harold [name]  MR#:  [id number] BIRTHDATE:  1965/06/30, 46 yrs. old  GENDER:  male ENDOSCOPIST:  Vania Rea. Jarold Motto, MD, [facility] REF. BY: PROCEDURE DATE:  10/17/2011 PROCEDURE:  Diagnostic Colonoscopy ASA CLASS:  Class III INDICATIONS:  family history of colon cancer MEDICATIONS:   propofol (Diprivan) 150 mg IV  DESCRIPTION OF PROCEDURE:   After the risks and benefits and of the procedure were explained, informed consent was obtained. Digital rectal exam was performed and revealed no abnormalities. The LB CF-H180AL [id number] endoscope was introduced through the anus and advanced to the cecum, which was identified by both the appendix and ileocecal valve.  The quality of the prep was excellent, using MoviPrep.  The instrument was then slowly withdrawn as the colon was fully examined. <<PROCEDUREIMAGES>>  FINDINGS:  No polyps or cancers were seen.  This was otherwise a normal examination of the colon.   Retroflexed views in the rectum revealed no abnormalities.    The scope was then withdrawn from the patient and the procedure completed.  COMPLICATIONS:  None ENDOSCOPIC IMPRESSION: 1) No polyps or cancers 2) Otherwise normal examination RECOMMENDATIONS: 1) Given your significant family history of colon cancer, you should have a repeat colonoscopy in 5 years  REPEAT EXAM:  No  ______________________________ Vania Rea. Jarold Motto, MD, [facility]  CC:  n. eSIGNED:   Vania Rea. Patterson at 10/17/2011 04:07 PM  Barron Schmid, [phone]

## 2011-10-17 NOTE — Telephone Encounter (Signed)
Per Dr. Sheryn Bison samples of Nexium 40mg  sent upstairs for pt. To be supplied with post procedure.  Rx sent to pharmacy for Nexium and also Diflucan per Dr. Norval Gable instructions.

## 2011-10-17 NOTE — Op Note (Signed)
[facility] [address] [place], [place]  [address]  ENDOSCOPY PROCEDURE REPORT  PATIENT:  Harold [name], Harold [name]  MR#:  [id number] BIRTHDATE:  26-Jul-1965, 46 yrs. old  GENDER:  male  ENDOSCOPIST:  Vania Rea. Jarold Motto, MD, [facility] Referred by:  PROCEDURE DATE:  10/17/2011 PROCEDURE:  EGD with biopsy, [address], EGD with biopsy for H. pylori [id number] ASA CLASS:  Class III INDICATIONS:  GAS AND BLOATING,,HX.OF PANCREATITIS AND LACTOSE INTOLERANCE.  MEDICATIONS:   There was residual sedation effect present from prior procedure., propofol (Diprivan) 200 mg IV, glycopyrrolate (Robinal) 0.2 mg IV TOPICAL ANESTHETIC:  Cetacaine Spray  DESCRIPTION OF PROCEDURE:   After the risks and benefits of the procedure were explained, informed consent was obtained.  The LB GIF-H180 [id number] endoscope was introduced through the mouth and advanced to the second portion of the duodenum.  The instrument was slowly withdrawn as the mucosa was fully examined. <<PROCEDUREIMAGES>>  Candida esophagitis in the proximal esophagus. SEE PICTURES.DENSE ADHERENT WHITE PLAQUES IN PROXIMAL HALF OF ESOPHAGITIS.  Multiple ulcers were found at the pylorus. SEE PICTURES.CLO BX. DONE.MULTIPLE GASTRIC PREPYLORIC ULCERS AND ANTRAL GASTRITIS NOTED.  Normal duodenal folds were noted. SI BX. DONE.  Otherwise normal esophagus. NO HH OR GERD NOTED.    MILD GASTRITIS IN FUNDUS.  The scope was then withdrawn from the patient and the procedure completed.  COMPLICATIONS:  None  ENDOSCOPIC IMPRESSION: 1) Candida esophagitis in the proximal esophagus 2) Ulcers, multiple at the pylorus 3) Normal duodenal folds 4) Otherwise normal esophagus 5. NSAID ULCERS V. H.PYLORI INFECTION 6.CANDIDA ESOPHSGITIS 7.R/O CELIAC DISEASE 8.NO HH OR GERD CHANGES NOTED RECOMMENDATIONS: 1) Await biopsy results 1.NEXIUM 40MG /QAM 2.DIFLUCAN 100MG  BID FOR 1 DAY,THEN DAILY FOR 10 DAYS 3.D/C NSAID USE 4.OV 1  MONTH  ______________________________ Vania Rea. Jarold Motto, MD, [facility]  CC:  Roderick Pee, MD  n. Rosalie Doctor:   Vania Rea. Patterson at 10/17/2011 04:23 PM  Barron Schmid, [phone]

## 2011-10-17 NOTE — Progress Notes (Signed)
Patient did not experience any of the following events: a burn prior to discharge; a fall within the facility; wrong site/side/patient/procedure/implant event; or a hospital transfer or hospital admission upon discharge from the facility. (G8907) Patient did not have preoperative order for IV antibiotic SSI prophylaxis. (G8918)  

## 2011-10-17 NOTE — Patient Instructions (Addendum)
Discharge instructions given with verbal understanding. Resume previous medications. Medications sent to pharmacy. YOU HAD AN ENDOSCOPIC PROCEDURE TODAY AT THE [facility]: Refer to the procedure report that was given to you for any specific questions about what was found during the examination.  If the procedure report does not answer your questions, please call your gastroenterologist to clarify.  If you requested that your care partner not be given the details of your procedure findings, then the procedure report has been included in a sealed envelope for you to review at your convenience later.  YOU SHOULD EXPECT: Some feelings of bloating in the abdomen. Passage of more gas than usual.  Walking can help get rid of the air that was put into your GI tract during the procedure and reduce the bloating. If you had a lower endoscopy (such as a colonoscopy or flexible sigmoidoscopy) you may notice spotting of blood in your stool or on the toilet paper. If you underwent a bowel prep for your procedure, then you may not have a normal bowel movement for a few days.  DIET: Your first meal following the procedure should be a light meal and then it is ok to progress to your normal diet.  A half-sandwich or bowl of soup is an example of a good first meal.  Heavy or fried foods are harder to digest and may make you feel nauseous or bloated.  Likewise meals heavy in dairy and vegetables can cause extra gas to form and this can also increase the bloating.  Drink plenty of fluids but you should avoid alcoholic beverages for 24 hours.  ACTIVITY: Your care partner should take you home directly after the procedure.  You should plan to take it easy, moving slowly for the rest of the day.  You can resume normal activity the day after the procedure however you should NOT DRIVE or use heavy machinery for 24 hours (because of the sedation medicines used during the test).    SYMPTOMS TO REPORT IMMEDIATELY: A  gastroenterologist can be reached at any hour.  During normal business hours, 8:30 AM to 5:00 PM Monday through Friday, call [phone].  After hours and on weekends, please call the GI answering service at [phone] who will take a message and have the physician on call contact you.   Following lower endoscopy (colonoscopy or flexible sigmoidoscopy):  Excessive amounts of blood in the stool  Significant tenderness or worsening of abdominal pains  Swelling of the abdomen that is new, acute  Fever of 100F or higher  Following upper endoscopy (EGD)  Vomiting of blood or coffee ground material  New chest pain or pain under the shoulder blades  Painful or persistently difficult swallowing  New shortness of breath  Fever of 100F or higher  Black, tarry-looking stools  FOLLOW UP: If any biopsies were taken you will be contacted by phone or by letter within the next 1-3 weeks.  Call your gastroenterologist if you have not heard about the biopsies in 3 weeks.  Our staff will call the home number listed on your records the next business day following your procedure to check on you and address any questions or concerns that you may have at that time regarding the information given to you following your procedure. This is a courtesy call and so if there is no answer at the home number and we have not heard from you through the emergency physician on call, we will assume that you have returned  to your regular daily activities without incident.  SIGNATURES/CONFIDENTIALITY: You and/or your care partner have signed paperwork which will be entered into your electronic medical record.  These signatures attest to the fact that that the information above on your After Visit Summary has been reviewed and is understood.  Full responsibility of the confidentiality of this discharge information lies with you and/or your care-partner.  

## 2011-10-18 ENCOUNTER — Telehealth: Payer: Self-pay

## 2011-10-18 NOTE — Telephone Encounter (Signed)
Left message on answering machine. 

## 2011-10-19 ENCOUNTER — Encounter: Payer: Self-pay | Admitting: Gastroenterology

## 2011-10-19 LAB — HELICOBACTER PYLORI SCREEN-BIOPSY: UREASE: POSITIVE

## 2011-10-21 ENCOUNTER — Other Ambulatory Visit: Payer: Self-pay | Admitting: *Deleted

## 2011-10-21 MED ORDER — AMOXICILL-CLARITHRO-LANSOPRAZ PO MISC: Freq: Two times a day (BID) | ORAL | Status: DC

## 2011-10-21 NOTE — Progress Notes (Signed)
Informed pt to finish the Diflucan before beginning the Prevpack; pt stated understanding.

## 2011-11-01 ENCOUNTER — Encounter: Payer: Self-pay | Admitting: Gastroenterology

## 2011-11-07 ENCOUNTER — Encounter: Payer: Self-pay | Admitting: Family Medicine

## 2011-11-07 ENCOUNTER — Other Ambulatory Visit: Payer: Self-pay | Admitting: Family Medicine

## 2011-11-07 ENCOUNTER — Ambulatory Visit (INDEPENDENT_AMBULATORY_CARE_PROVIDER_SITE_OTHER): Payer: Medicaid Other | Admitting: Family Medicine

## 2011-11-07 ENCOUNTER — Encounter: Payer: Self-pay | Admitting: *Deleted

## 2011-11-07 VITALS — BP 180/90 | Temp 98.6°F | Wt 255.0 lb

## 2011-11-07 DIAGNOSIS — G8929 Other chronic pain: Secondary | ICD-10-CM

## 2011-11-07 DIAGNOSIS — M778 Other enthesopathies, not elsewhere classified: Secondary | ICD-10-CM

## 2011-11-07 DIAGNOSIS — E781 Pure hyperglyceridemia: Secondary | ICD-10-CM

## 2011-11-07 DIAGNOSIS — M65839 Other synovitis and tenosynovitis, unspecified forearm: Secondary | ICD-10-CM

## 2011-11-07 DIAGNOSIS — I10 Essential (primary) hypertension: Secondary | ICD-10-CM

## 2011-11-07 DIAGNOSIS — M549 Dorsalgia, unspecified: Secondary | ICD-10-CM

## 2011-11-07 MED ORDER — ROSUVASTATIN CALCIUM 10 MG PO TABS: 10.0000 mg | ORAL_TABLET | Freq: Every day | ORAL | Status: DC

## 2011-11-07 MED ORDER — HYDROCODONE-ACETAMINOPHEN 7.5-750 MG PO TABS: 1.0000 | ORAL_TABLET | Freq: Four times a day (QID) | ORAL | Status: DC | PRN

## 2011-11-07 NOTE — Progress Notes (Signed)
  Subjective:    Patient ID: Harold [name], male    DOB: [birth year], 46 y.o.   MRN: [id number]  HPI Harold [name] is a 46 year old male who comes in today for multiple issues  He has a history of chronic back pain and takes Vicodin 4 times daily  He also has a history of hyperlipidemia and is on Crestor 10 mg daily and needs refills  He's also pain in his left wrist for the past 2 days no history of trauma  He also has a history of hypertension and is on a combination of Lasix 20 mg daily along with lisinopril 40 mg daily. BP at home 125/70. Blood pressure today elevated 180/90 because he was told we do not take his [employer] and he is very upset that we can no longer see him  He is then going back to school at [employer], and needs a note saying that he has chronic low back pain   Review of Systems    general and orthopedic review of systems otherwise negative Objective:   Physical Exam Well-developed well-nourished male in no acute distress examination of his shows tenderness in the flexor tendons otherwise exam normal       Assessment & Plan:  Tendinitis left wrist elevation ice Motrin 400 twice a day  Chronic back pain continue Vicodin  Hyperlipidemia continue Crestor 10 mg daily

## 2011-11-07 NOTE — Patient Instructions (Addendum)
Elevation, ice, Motrin 400 mg twice daily  Continue your other meds

## 2011-11-22 ENCOUNTER — Ambulatory Visit: Payer: Medicaid Other | Admitting: Gastroenterology

## 2011-12-28 ENCOUNTER — Encounter: Payer: Self-pay | Admitting: Physical Medicine & Rehabilitation

## 2012-01-09 ENCOUNTER — Ambulatory Visit (HOSPITAL_BASED_OUTPATIENT_CLINIC_OR_DEPARTMENT_OTHER): Payer: Medicaid Other | Admitting: Physical Medicine & Rehabilitation

## 2012-01-09 ENCOUNTER — Encounter: Payer: Self-pay | Admitting: Physical Medicine & Rehabilitation

## 2012-01-09 ENCOUNTER — Encounter: Payer: Medicaid Other | Attending: Physical Medicine & Rehabilitation

## 2012-01-09 VITALS — BP 176/97 | HR 77 | Resp 14 | Ht 71.0 in | Wt 256.0 lb

## 2012-01-09 DIAGNOSIS — M2390 Unspecified internal derangement of unspecified knee: Secondary | ICD-10-CM

## 2012-01-09 DIAGNOSIS — M25569 Pain in unspecified knee: Secondary | ICD-10-CM

## 2012-01-09 DIAGNOSIS — Z5181 Encounter for therapeutic drug level monitoring: Secondary | ICD-10-CM

## 2012-01-09 DIAGNOSIS — M2391 Unspecified internal derangement of right knee: Secondary | ICD-10-CM

## 2012-01-09 DIAGNOSIS — M545 Low back pain, unspecified: Secondary | ICD-10-CM | POA: Insufficient documentation

## 2012-01-09 DIAGNOSIS — M25469 Effusion, unspecified knee: Secondary | ICD-10-CM

## 2012-01-09 DIAGNOSIS — M549 Dorsalgia, unspecified: Secondary | ICD-10-CM

## 2012-01-09 DIAGNOSIS — M25461 Effusion, right knee: Secondary | ICD-10-CM

## 2012-01-09 MED ORDER — DIAZEPAM 10 MG PO TABS: 10.0000 mg | ORAL_TABLET | Freq: Once | ORAL | Status: DC

## 2012-01-09 MED ORDER — DICLOFENAC SODIUM 1 % TD GEL: 1.0000 "application " | Freq: Four times a day (QID) | TRANSDERMAL | Status: DC

## 2012-01-09 MED ORDER — MELOXICAM 15 MG PO TABS: 15.0000 mg | ORAL_TABLET | Freq: Every day | ORAL | Status: DC

## 2012-01-09 NOTE — Patient Instructions (Signed)
I ordered a right knee MRI for your pain and clicking and catching sensation The back injections will be done here and I will give you Valium 10 mg prior to the injection. If the back injections only give you a temporary relief there is a longer lasting procedure called radiofrequency neurotomy. Please see the pamphlet  The new arthritis pain medication is called Mobic you'll take one a day with food

## 2012-01-09 NOTE — Progress Notes (Signed)
Subjective:    Patient ID: Harold [name], male    DOB: January 30, 1966, 46 y.o.   MRN: [id number]  HPI Back pain and R knee pain.R knee painful since 1997.  Arthroscopic surgery to "clean it out". Back painful since 2003 I reviewed MRIs from 2006 as well as CT from 2006. No disc problems. 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 Has had a recent x-ray but no recent MRIs. Past medical history Negative for coronary artery disease Positive for pericarditis history of CHF,Related to cardiac tamponade rather than cardiomyopathy Pain Inventory Average Pain 6 Pain Right Now 8 My pain is constant, tingling and aching  In the last 24 hours, has pain interfered with the following? General activity 6 Relation with others 7 Enjoyment of life 8 What TIME of day is your pain at its worst? morning Sleep (in general) Poor  Pain is worse with: some activites Pain improves with: medication and injections Relief from Meds: 7  Mobility use a cane how many minutes can you walk? 5 ability to climb steps?  yes do you drive?  yes Do you have any goals in this area?  yes  Function disabled: date disabled 2013 Do you have any goals in this area?  no  Neuro/Psych weakness trouble walking anxiety  Prior Studies bone scan x-rays CT/MRI nerve study MRI OF LUMBAR SPINE WITHOUT CONTRAST  Technique: Multiplanar and multiecho pulse sequences of the lumbar spine, to  include the lower thoracic and upper sacral regions, were obtained according to  standard protocol without IV contrast.  Comparison: Report dated 06/22/2001. This films are not available for comparison  at this time.  Findings: For counting purposes, the left open disc space is labeled the L5-S1  level. 1.4 cm hemangioma in the S1 vertebral body. 5 mm hemangioma in the L3  vertebral body. Mild facet degenerative changes are demonstrated on the  left at  the L5-S1 level, with a small amount of fluid within the joint and mild spur  formation without significant canal or foraminal stenosis. The remaining levels  of the lumbar and lower thoracic spine have normal appearances. Normal appearing  conus medullaris with its tip at the L1 level. No disc bulges or disc  herniations seen.  IMPRESSION  Mild facet degenerative change is on the left at the L5-S1 level without  significant canal or foraminal stenosis. Otherwise, unremarkable examination.   11/30/2011 Lumbar Xray is negative Physicians involved in your care Any changes since last visit?  no    Family History  Problem Relation Age of Onset  . Diabetes Father   . Colon cancer Father   . Heart disease Father   . Pancreatitis Father   . Hypertension Other     entire family  . Diabetes Mother   . Liver cancer Paternal Uncle   . Diabetes Sister   . Stomach cancer Maternal Grandfather   . Pancreatitis Paternal Uncle     x 2  . Esophageal cancer Neg Hx   . Rectal cancer Neg Hx    History   Social History  . Marital Status: Divorced    Spouse Name: N/A    Number of Children: 1  . Years of Education: N/A   Occupational History  . disabled    Social History Main Topics  . Smoking status: Current Every Day Smoker -- 0.3 packs/day for 20 years    Types: Cigarettes  .  Smokeless tobacco: Never Used  . Alcohol Use: 0.0 oz/week     several times a week and mostly on the weekends  . Drug Use: No  . Sexually Active: None   Other Topics Concern  . None   Social History Narrative   Regular exercise- No   Past Surgical History  Procedure Date  . Tonsillectomy and adenoidectomy     one tonsil  . Pericardectomy 07/15/2010    Dr. Tyrone Sage  . Pleurx catheter placement 07/15/2010    Dr. Tyrone Sage   Past Medical History  Diagnosis Date  . Obesity   . Metabolic syndrome   . Hypertension   . Depression   . GERD (gastroesophageal reflux disease)   . Chronic back pain    . History of pancreatitis     a. admx 04-2009.Marland KitchenMarland Kitchen? 2-2 triglycerides  . Hypertriglyceridemia     a. followed by [facility]  . Acute pericarditis     admx 03-26-10 thru 03-29-10; a. echo 03-26-10: EF 55-60%; mild LVH; trivial MR; RVF ok; mild -mod circumferential Eff w/o tamponade  . Constrictive pericarditis   . OSA (obstructive sleep apnea)   . Gout     pt denies  . Family history of malignant neoplasm of gastrointestinal tract    BP 176/97  Pulse 77  Resp 14  Ht 5\' 11"  (1.803 m)  Wt 256 lb (116.121 kg)  BMI 35.70 kg/m2  SpO2 95%     Review of Systems  Musculoskeletal: Positive for myalgias, back pain, arthralgias and gait problem.  Neurological: Positive for weakness and numbness.  Psychiatric/Behavioral: Positive for dysphoric mood.  All other systems reviewed and are negative.       Objective:   Physical Exam  Constitutional: He is oriented to person, place, and time. He appears well-developed and well-nourished.       obese  HENT:  Head: Normocephalic and atraumatic.  Neck: Normal range of motion.  Musculoskeletal:       Right knee: He exhibits decreased range of motion and effusion. He exhibits no LCL laxity. tenderness found. Medial joint line and lateral joint line tenderness noted. No patellar tendon tenderness noted.       Lumbar back: He exhibits decreased range of motion, tenderness and pain. He exhibits no deformity and no spasm.       Right knee without evidence of erythema Right knee with full extension but only 95 of flexion  Neurological: He is alert and oriented to person, place, and time. He has normal strength. No sensory deficit.  Reflex Scores:      Tricep reflexes are 2+ on the right side and 2+ on the left side.      Bicep reflexes are 2+ on the right side and 2+ on the left side.      Brachioradialis reflexes are 2+ on the right side and 2+ on the left side.      Patellar reflexes are 2+ on the right side and 2+ on the left side.       Achilles reflexes are 2+ on the right side and 2+ on the left side. Psychiatric: His mood appears anxious. His affect is angry.          Assessment & Plan:  1. Chronic low back pain has responded in the past to L5-S1 facet injections. These were performed by Dr. Andrey Campanile. These were done in 2003. As I explained to patient I do not think is back pain is discogenic. Do not think repeat MRI  needed at the current time. Recent x-rays showed no compression fractures or acute abnormalities. I would recommend L3-L4 medial branch blocks and L5 dorsal ramus injections bilaterally. It's only short-term response then move on to radiofrequency neurotomy. I explained that I do not think narcotic analgesics should be a long-term solution to his pain problems. He has an elevated opioid risk total score making him a poor candidate for chronic narcotic analgesic medication management 2. Knee pain with knee effusion. He may have recurrent meniscal tear. Will evaluate with MRI and may require orthopedic referral I recommend Mobic however the patient is concerned about cardiovascular risk. Looking through his chart he's had pericarditis but no coronary artery disease. He does have a family history of coronary artery disease. Overall I think he would be a safe choice for him however he declines to use this. We'll trial him on Voltaren gel.

## 2012-01-12 ENCOUNTER — Telehealth: Payer: Self-pay | Admitting: Physical Medicine & Rehabilitation

## 2012-01-12 NOTE — Telephone Encounter (Signed)
Cream not working

## 2012-01-13 NOTE — Telephone Encounter (Signed)
Patient cannot take mobic it made him feel bad.  He is not getting much relief from the Voltaren gel.  He says hydrocodone 7.5 helps and he would like to get back on that.  Prednisone also helped.  His knee is really what is bothering him not his back.  He has a injection scheduled for Thursday for his back but would like something for relief before then.  Offered appointment but he cant come in before the appointment. Please advise.

## 2012-01-13 NOTE — Telephone Encounter (Signed)
High opioid risk per Dr. Jodean Lima last note , will not prescribe narcotics, he might have to see Dr. Doroteo Bradford again and discuss this

## 2012-01-17 ENCOUNTER — Telehealth: Payer: Self-pay | Admitting: Physical Medicine & Rehabilitation

## 2012-01-17 ENCOUNTER — Emergency Department (HOSPITAL_COMMUNITY)
Admission: EM | Admit: 2012-01-17 | Discharge: 2012-01-17 | Disposition: A | Discharge status: Home / Self Care | Payer: Medicaid Other | Source: Home / Self Care | Attending: Family Medicine | Admitting: Family Medicine

## 2012-01-17 ENCOUNTER — Encounter (HOSPITAL_COMMUNITY): Payer: Self-pay | Admitting: Emergency Medicine

## 2012-01-17 DIAGNOSIS — M25569 Pain in unspecified knee: Secondary | ICD-10-CM

## 2012-01-17 DIAGNOSIS — G8929 Other chronic pain: Secondary | ICD-10-CM

## 2012-01-17 MED ORDER — TRAMADOL HCL 50 MG PO TABS: 50.0000 mg | ORAL_TABLET | Freq: Four times a day (QID) | ORAL | Status: DC | PRN

## 2012-01-17 NOTE — ED Notes (Signed)
Right knee pain is a chronic issue.  Reports having surgery 1996.  Around 2003 had back issues as well and change in gait reflected in right knee pain.  Patient has had knee pain treated by dr todd in the past, but since medicaid information is inaccurate, regular physicians unable to see patient as they have in the past.

## 2012-01-17 NOTE — Telephone Encounter (Signed)
Calling back on medication.  2nd call.

## 2012-01-17 NOTE — ED Provider Notes (Signed)
History     CSN: [id number]  Arrival date & time 01/17/12  1813   First MD Initiated Contact with Patient 01/17/12 1827      Chief Complaint  Patient presents with  . Knee Pain    (Consider location/radiation/quality/duration/timing/severity/associated sxs/prior treatment) HPI Comments: 46 year old male with history of chronic knee pain. Status for arthroscopy of the right knee in 1996. Here complaining of right knee pain exacerbation for one month. He has seen Dr. Eddie Candle at rehabilitation [facility] pain management clinic. Has been prescribed with diclofenac gel and MOBIC but reports he's not using this medications do to his history of high blood pressure and heart disease. Has taken Tylenol and over-the-counter ibuprofen with minimal improvement. He wears her cane. Denies new onset of swelling redness or increased temperature. Denies general malaise, fever or chills. Patient blood pressure was found to be elevated today at 169/105 manually. He denies headache blurry vision, chest pain shortness of breath. Status he has a comming appointment in 3 days with his cardiologist Dr. Tawanna Cooler. Patient reports he has been told to double his Lasix and potassium dose when his blood pressure remains elevated. He denies any increase weight in the last few days. Has had Toradol injections and is taking over-the-counter ibuprofen in the last few days due to chronic pain.   Past Medical History  Diagnosis Date  . Obesity   . Metabolic syndrome   . Hypertension   . Depression   . GERD (gastroesophageal reflux disease)   . Chronic back pain   . History of pancreatitis     a. admx 04-2009.Marland KitchenMarland Kitchen? 2-2 triglycerides  . Hypertriglyceridemia     a. followed by [facility]  . Acute pericarditis     admx 03-26-10 thru 03-29-10; a. echo 03-26-10: EF 55-60%; mild LVH; trivial MR; RVF ok; mild -mod circumferential Eff w/o tamponade  . Constrictive pericarditis   . OSA (obstructive sleep apnea)   . Gout     pt denies    . Family history of malignant neoplasm of gastrointestinal tract     Past Surgical History  Procedure Date  . Tonsillectomy and adenoidectomy     one tonsil  . Pericardectomy 07/15/2010    Dr. Tyrone Sage  . Pleurx catheter placement 07/15/2010    Dr. Tyrone Sage  . Shoulder surgery     right and left shoulders  . Knee surgery     right    Family History  Problem Relation Age of Onset  . Diabetes Father   . Colon cancer Father   . Heart disease Father   . Pancreatitis Father   . Hypertension Other     entire family  . Diabetes Mother   . Liver cancer Paternal Uncle   . Diabetes Sister   . Stomach cancer Maternal Grandfather   . Pancreatitis Paternal Uncle     x 2  . Esophageal cancer Neg Hx   . Rectal cancer Neg Hx     History  Substance Use Topics  . Smoking status: Current Every Day Smoker -- 0.3 packs/day for 20 years    Types: Cigarettes  . Smokeless tobacco: Never Used  . Alcohol Use: 0.0 oz/week     several times a week and mostly on the weekends      Review of Systems  Constitutional: Negative for fever and chills.  Eyes: Negative for visual disturbance.  Respiratory: Negative for cough, chest tightness, shortness of breath and wheezing.   Cardiovascular: Negative for chest pain, palpitations and leg  swelling.  Gastrointestinal: Negative for nausea, vomiting and abdominal pain.  Musculoskeletal:       Knee pain as history of present illness  Skin: Negative for rash.  Neurological: Negative for dizziness, seizures, weakness, numbness and headaches.  All other systems reviewed and are negative.    Allergies  Colchicine and Diphenhydramine hcl  Home Medications   Current Outpatient Rx  Name Route Sig Dispense Refill  . CETIRIZINE HCL 10 MG PO TABS Oral Take 10 mg by mouth daily.    [name] CITALOPRAM HYDROBROMIDE 40 MG PO TABS Oral Take 0.5 tablets (20 mg total) by mouth daily. 100 tablet 3  . CYCLOBENZAPRINE HCL 10 MG PO TABS Oral Take 10 mg by mouth at  bedtime.    [name] DIAZEPAM 10 MG PO TABS Oral Take 1 tablet (10 mg total) by mouth once. 1 tablet 3    Take prior to injection  . DICLOFENAC SODIUM 1 % TD GEL Topical Apply 1 application topically 4 (four) times daily. 3 Tube 2  . FENOFIBRATE 145 MG PO TABS Oral Take 1 tablet (145 mg total) by mouth daily. 100 tablet 3  . FUROSEMIDE 20 MG PO TABS Oral Take 1 tablet (20 mg total) by mouth daily. 90 tablet 1  . LISINOPRIL 40 MG PO TABS Oral Take 1 tablet (40 mg total) by mouth daily. 90 tablet 3  . MEGA MULTIVITAMIN FOR MEN PO TABS Oral Take 500 mg by mouth 2 (two) times daily.      [name] POTASSIUM CHLORIDE ER 10 MEQ PO TBCR Oral Take 1 tablet (10 mEq total) by mouth daily. 90 tablet 1  . RANITIDINE HCL 150 MG PO TABS Oral Take 150 mg by mouth daily.    [name] ROSUVASTATIN CALCIUM 10 MG PO TABS Oral Take 1 tablet (10 mg total) by mouth daily. 100 tablet 3  . TRAMADOL HCL 50 MG PO TABS Oral Take 1 tablet (50 mg total) by mouth every 6 (six) hours as needed for pain. 15 tablet 0    BP 169/105  Pulse 73  Temp 98.6 F (37 C) (Oral)  Resp 18  SpO2 96%  Physical Exam  Nursing note and vitals reviewed. Constitutional: He is oriented to person, place, and time. He appears well-developed and well-nourished. No distress.  HENT:  Head: Normocephalic and atraumatic.  Eyes: EOM are normal. Pupils are equal, round, and reactive to light.  Cardiovascular: Normal rate and regular rhythm.        Trace lower extremity edema  Pulmonary/Chest: Effort normal and breath sounds normal.  Musculoskeletal:       Right knee: No abvious swelling or deformity. No redness or increased temperature. No palpable effusion. Reported pain with palpation below and lateral to the patella. No hyper laxity on stress valgo or varus. Crepitus fell with flexion and extension and also patient reported pain with active and passive movement. Negative drawer test. Do not feel Baker's cyst. Right lower extremity with normal superficial  sensation. Also patent dorsal pedal and tibial posterior pulses. Patient is weightbearing of the report pain with walking and has a limp on the right side. Wears a cane.  Neurological: He is alert and oriented to person, place, and time.  Skin: No rash noted. He is not diaphoretic.    ED Course  Procedures (including critical care time)  Labs Reviewed - No data to display No results found.   1. Chronic knee pain       MDM  Treated with tramadol and over-the-counter  Tylenol. Encouraged to remain of nonsteroidal anti-inflammatory medications until he follows up with his primary care provider. Encouraged to followup with his rehabilitation/pain management specialist an orthopedic Dr. to establish best options to manage his chronic pain.        Sharin Grave, MD 01/18/12 1414

## 2012-01-17 NOTE — ED Notes (Signed)
Patient requested medicine to be called in to [facility] location.  Verified with dr Tressia Danas this request.  Dr Tressia Danas agreed to request.  .  Called [facility] and canceled e-script.

## 2012-01-18 NOTE — Telephone Encounter (Signed)
Patient will discuss options at appt.

## 2012-01-19 ENCOUNTER — Encounter: Payer: Self-pay | Admitting: Physical Medicine & Rehabilitation

## 2012-01-19 ENCOUNTER — Ambulatory Visit (HOSPITAL_BASED_OUTPATIENT_CLINIC_OR_DEPARTMENT_OTHER): Payer: Medicaid Other | Admitting: Physical Medicine & Rehabilitation

## 2012-01-19 VITALS — BP 151/95 | HR 61 | Resp 14 | Ht 71.0 in | Wt 255.6 lb

## 2012-01-19 DIAGNOSIS — M25461 Effusion, right knee: Secondary | ICD-10-CM

## 2012-01-19 DIAGNOSIS — M25469 Effusion, unspecified knee: Secondary | ICD-10-CM

## 2012-01-19 DIAGNOSIS — M47817 Spondylosis without myelopathy or radiculopathy, lumbosacral region: Secondary | ICD-10-CM

## 2012-01-19 DIAGNOSIS — M47816 Spondylosis without myelopathy or radiculopathy, lumbar region: Secondary | ICD-10-CM

## 2012-01-19 MED ORDER — TRAMADOL HCL 50 MG PO TABS: 50.0000 mg | ORAL_TABLET | Freq: Three times a day (TID) | ORAL | Status: DC | PRN

## 2012-01-19 MED ORDER — TRAMADOL HCL 50 MG PO TABS: 50.0000 mg | ORAL_TABLET | Freq: Four times a day (QID) | ORAL | Status: DC | PRN

## 2012-01-19 NOTE — Progress Notes (Signed)
Bilateral Lumbar L3, L4  medial branch blocks and L 5 dorsal ramus injection under fluoroscopic guidance  Indication: Lumbar pain which is not relieved by medication management or other conservative care and interfering with self-care and mobility.  Informed consent was obtained after describing risks and benefits of the procedure with the patient, this includes bleeding, infection, paralysis and medication side effects.  The patient wishes to proceed and has given written consent.  The patient was placed in prone position.  The lumbar area was marked and prepped with Betadine.  One mL of 1% lidocaine was injected into each of 6 areas into the skin and subcutaneous tissue.  Then a 22-gauge 5in spinal needle was inserted targeting the junction of the left S1 superior articular process and sacral ala junction. Needle was advanced under fluoroscopic guidance.  Bone contact was made.  Omnipaque 180 was injected x 0.5 mL demonstrating no intravascular uptake.  Then a solution containing one mL of 4 mg per mL dexamethasone and 3 mL of 2% MPF lidocaine was injected x 0.5 mL.  Then the left L5 superior articular process in transverse process junction was targeted.  Bone contact was made.  Omnipaque 180 was injected x 0.5 mL demonstrating no intravascular uptake. Then a solution containing one mL of 4 mg per mL dexamethasone and 3 mL of 2% MPF lidocaine was injected x 0.5 mL.  Then the left L4 superior articular process in transverse process junction was targeted.  Bone contact was made.  Omnipaque 180 was injected x 0.5 mL demonstrating no intravascular uptake.  Then a solution containing one mL of 4 mg per mL dexamethasone and 3 mL if 2% MPF lidocaine was injected x 0.5 mL.  This same procedure was performed on the right side using the same needle, technique and injectate.  Patient tolerated procedure well.  Post procedure instructions were given. 

## 2012-01-19 NOTE — Progress Notes (Signed)
  PROCEDURE RECORD [facility]   Name: Harold [name] DOB:[birth year] MRN: [id number]  Date:01/19/2012  Physician: Claudette Laws, MD    Nurse/CMA: Jovanie Verge RN  Allergies:  Allergies  Allergen Reactions  . Colchicine     REACTION: diarrhea  . Diphenhydramine Hcl     REACTION: itchingm :jittery"    Consent Signed: yes  Is patient diabetic? no  CBG today?  Pregnant: no LMP: No LMP for male patient. (age 44-55)  Anticoagulants: no Anti-inflammatory: no Antibiotics: no  Procedure: Bilateral Medial Branch Block L3-4-5 Position: Prone Start Time: 9:28 am  End Time: 9:42 Fluoro Time: 39sec  RN/CMA Museum/gallery exhibitions officer RN    Time 8:37 9:47    BP 151/95 178/100    Pulse 61 71    Respirations 14 14    O2 Sat 97 100    S/S 6 6    Pain Level 8/10 Not sure     D/C home with Harold [name], patient A & O X 3, D/C instructions reviewed, and sits independently.

## 2012-01-19 NOTE — Patient Instructions (Signed)
Facet Block A facet block is an injection procedure used to numb nerves near a spinal joint (facet). The injection usually includes a medicine like Novacaine (anesthetic) and a steroid medicine (similar to cortisone). The injections are made directly into the facet joint of the back. They are used for patients with several types of neck or back pain problems (such as worsening arthritis or persistent pain after surgery) that have not been helped with anti-inflammatory medications, exercise programs, physical therapy, and other forms of pain management. Multiple injections may be needed depending on how many joints are involved.  A facet block procedure can be helpful with diagnosis as well as providing therapeutic pain relief. One of three things may happen after the procedure:  The pain does not go away. This can mean that the pain is probably not coming from blocked facet joints. This information is helpful with diagnosis.  The pain goes away and stays away for a few hours but the original pain comes back and does not get better again. This information is also helpful with diagnosis. It can mean that pain is probably coming from the joints; but the steroid was not helpful for longer term pain control.  The pain goes away after the block, then returns later that day, and then gets better again over the next few days. This can mean that the block was helpful for pain control and the steroid had a longer lasting effect. If there is good, lasting benefit from the injections, the block may be repeated from 3 to 5 times. If there is good relief but it is only of short-term benefit, other procedures (such as radiofrequency lesioning) may be considered.  Note: The procedure cannot be performed if you have an active infection, a lesion on or near the area of injection, flu, cold, fever, very high blood pressure or if you are on blood thinners. Please make your doctor aware of any of these conditions. This is for  your safety!  LET YOUR CAREGIVER KNOW ABOUT:   Allergies.  Medications taken including herbs, eye drops, over the counter medications, and creams  Use of steroids (by mouth or creams).  Possible pregnancy, if applicable.  Previous problems with anesthetics or Novocaine.  History of blood clots.  History of bleeding or blood problems.  Previous surgery, particularly of the neck and/or back  Other health problems. RISKS AND COMPLICATIONS These are very uncommon but include:  Bleeding.  Injury to a nerve near the injection site.  Weakness or numbness in areas controlled by nerves near the injection site.  Infection.  Pain at the site of the injection.  Temporary fluid retention in those who are prone to this problem.  Allergic reaction to anesthetics or medicines used during the procedure. Diabetics may have a temporary increase in their blood sugar after any surgical procedure, especially if steroids are used. Stinging/burning of the numbing medicine is the most uncomfortable part of the procedure; however every person's response to any procedure is individual.  BEFORE THE PROCEDURE   Your caregiver will provide instructions about stopping any medication before the procedure.  Unless advised otherwise, if the injections are in your neck, you may take your medications as usual with a sip of water but do not eat or drink for 6 hours before the procedure.  Unless advised otherwise, you may eat, drink and take your medications as usual on the day of the procedure (both before and after) if the injections are to be in your lower back.    There is no other specific preparation necessary unless advised otherwise. PROCEDURE After checking your blood pressure, the procedure will be done in the x-ray (fluoroscopy) room while lying on your stomach. For procedures in the neck, an intravenous line is usually started. The back is then cleansed with an antiseptic soap. Sterile drapes are  placed in this area. The skin is numbed with a local anesthetic. This is felt as a stinging or burning sensation. Using x-ray guidance, needles are then advanced to the appropriate locations. Once the needles are in the proper location, the anesthetic and steroid is injected through the needles and the needles are removed. The skin is then cleansed and bandages are applied. Blood pressure will be checked again, and you will be discharged to leave with your ride after your caregiver says it is okay to go.  AFTER THE PROCEDURE  You may not drive for the remainder of the day after your procedure. An adult must be present to drive you home or to go with you in a taxi or on public transportation. The procedure will be canceled if you do not have a responsible adult with you! This is for your safety.  HOME CARE INSTRUCTIONS   The bandages noted above can be removed on the morning after the procedure.  Resume medications according to your caregiver's instructions.  No heat is to be used near or over the injected area(s) for the remainder of the day.  No tub bath or soaking in water (such as a pool, jacuzzi, etc.) for the remainder of the day.  Some local tenderness may be experienced for a couple of days after the injection. Using an ice pack three or four times a day will help this.  Keep track of the amount of pain relief as well as how long the pain relief lasted. SEEK MEDICAL CARE IF:   There is drainage from the injection site.  Pain is not controlled with medications prescribed.  There is significant bleeding or swelling. SEEK IMMEDIATE MEDICAL CARE IF:   You develop a fever of 101 F (38.3 C) or greater.  Worsening pain, swelling, and/or red streaking develops in the skin around the injection site.  Severe pain develops and cannot be controlled with medications prescribed.  You develop any headache, stiff neck, nausea, vomiting, or your eyes become very sensitive to light.  Weakness  or paralysis develops in arms or legs not present before the procedure.  You develop difficulty urinating or difficulty breathing. Document Released: 08/03/2006 Document Revised: 06/06/2011 Document Reviewed: 07/24/2008 [employer] Patient Information 2013 [employer].  

## 2012-01-21 ENCOUNTER — Telehealth: Payer: Self-pay | Admitting: Physical Medicine & Rehabilitation

## 2012-01-21 NOTE — Telephone Encounter (Signed)
Pt call me to ask for something stronger than tramadol.  Has hi opioid risk score, non narcotic.  Wishes to terminate treatmetn with me.  Please send letter

## 2012-01-23 ENCOUNTER — Encounter: Payer: Self-pay | Admitting: Physical Medicine & Rehabilitation

## 2012-01-23 NOTE — Telephone Encounter (Signed)
Melissa please send discharge letter. This was at patient request.

## 2012-01-24 ENCOUNTER — Ambulatory Visit: Payer: Medicaid Other | Admitting: Physical Medicine & Rehabilitation

## 2012-01-31 ENCOUNTER — Ambulatory Visit: Payer: Medicaid Other | Admitting: Physical Medicine & Rehabilitation

## 2012-02-09 ENCOUNTER — Ambulatory Visit: Payer: Medicaid Other | Admitting: Family Medicine

## 2012-02-09 ENCOUNTER — Telehealth: Payer: Self-pay | Admitting: Family Medicine

## 2012-02-09 NOTE — Telephone Encounter (Signed)
Harold [name], we've got Harold [name] straightened out. Here's the breakdown: Nov 2013 - medicaid - he will not be able to have appt with [place] this month Dec 2013 - self pay - i made him an appt w/ Dr. Tawanna Cooler for 12/29 Mar 2012 - Medicare and Medicaid - he will be able to remain w/ Dr. Tawanna Cooler  In the interim, he needs refills. Can we accommodate until Dec when he comes in on 12/2? Needs refills on the following:  citalopram (CELEXA) 40 MG tablet fenofibrate (TRICOR) 145 MG tablet furosemide (LASIX) 20 MG tablet lisinopril (PRINIVIL,ZESTRIL) 40 MG tablet potassium chloride (K-DUR) 10 MEQ tablet rosuvastatin (CRESTOR) 10 MG tablet Vicodin 7/5 - 750mg   [facility]: [facility] [address]

## 2012-02-09 NOTE — Telephone Encounter (Signed)
Dr Todd notified. 

## 2012-02-10 ENCOUNTER — Other Ambulatory Visit: Payer: Self-pay | Admitting: Family Medicine

## 2012-02-10 DIAGNOSIS — M79609 Pain in unspecified limb: Secondary | ICD-10-CM

## 2012-02-10 DIAGNOSIS — I10 Essential (primary) hypertension: Secondary | ICD-10-CM

## 2012-02-10 DIAGNOSIS — E8881 Metabolic syndrome: Secondary | ICD-10-CM

## 2012-02-10 DIAGNOSIS — E781 Pure hyperglyceridemia: Secondary | ICD-10-CM

## 2012-02-10 DIAGNOSIS — G8929 Other chronic pain: Secondary | ICD-10-CM

## 2012-02-10 MED ORDER — FENOFIBRATE 145 MG PO TABS: 145.0000 mg | ORAL_TABLET | Freq: Every day | ORAL | Status: DC

## 2012-02-10 MED ORDER — LISINOPRIL 40 MG PO TABS: 40.0000 mg | ORAL_TABLET | Freq: Every day | ORAL | Status: DC

## 2012-02-10 MED ORDER — FUROSEMIDE 20 MG PO TABS: 20.0000 mg | ORAL_TABLET | Freq: Every day | ORAL | Status: DC

## 2012-02-10 MED ORDER — HYDROCODONE-ACETAMINOPHEN 7.5-325 MG PO TABS: 1.0000 | ORAL_TABLET | Freq: Four times a day (QID) | ORAL | Status: DC | PRN

## 2012-02-10 MED ORDER — CITALOPRAM HYDROBROMIDE 40 MG PO TABS: 20.0000 mg | ORAL_TABLET | Freq: Every day | ORAL | Status: DC

## 2012-02-10 MED ORDER — POTASSIUM CHLORIDE ER 10 MEQ PO TBCR: 10.0000 meq | EXTENDED_RELEASE_TABLET | Freq: Every day | ORAL | Status: DC

## 2012-02-10 MED ORDER — ROSUVASTATIN CALCIUM 10 MG PO TABS: 10.0000 mg | ORAL_TABLET | Freq: Every day | ORAL | Status: DC

## 2012-02-27 ENCOUNTER — Encounter: Payer: Self-pay | Admitting: Family Medicine

## 2012-02-27 ENCOUNTER — Ambulatory Visit (INDEPENDENT_AMBULATORY_CARE_PROVIDER_SITE_OTHER): Payer: Self-pay | Admitting: Family Medicine

## 2012-02-27 VITALS — BP 140/90 | Temp 98.2°F | Wt 267.0 lb

## 2012-02-27 DIAGNOSIS — I10 Essential (primary) hypertension: Secondary | ICD-10-CM

## 2012-02-27 DIAGNOSIS — M549 Dorsalgia, unspecified: Secondary | ICD-10-CM

## 2012-02-27 DIAGNOSIS — G8929 Other chronic pain: Secondary | ICD-10-CM

## 2012-02-27 MED ORDER — HYDROCODONE-ACETAMINOPHEN 7.5-325 MG PO TABS: 1.0000 | ORAL_TABLET | Freq: Four times a day (QID) | ORAL | Status: DC | PRN

## 2012-02-27 NOTE — Patient Instructions (Signed)
Continue your current medications  See Dr. Cassandria Santee as soon as you get your insurance  At that juncture they will take over prescribing your  pain medication or they were refer you to the pain clinic for further evaluation  Check your blood pressure weekly

## 2012-02-27 NOTE — Progress Notes (Signed)
  Subjective:    Patient ID: Harold [name], male    DOB: April 16, 1965, 46 y.o.   MRN: [id number]  [name] is a 46 year old male single,,,,,,,,,,,, who lives with his girlfriend who smokes,,,,,,,,,,, who comes in today for 2 problems  Has a history of underlying hypertension and is currently on Lasix 20 mg daily, potassium 10 mEq daily, lisinopril 40 mg daily BP at home 130/80  He's to to get Medicare disability next month because of back and knee pain. I explained to him I would refill his medication which he takes a narcotic 3 times daily. Once he gets his Medicare disability and sees Harold [name] then they will take care of writing his pain medication  He's going to [facility] [facility]   Review of Systems General and cardiovascular review of systems otherwise negative    Objective:   Physical Exam Overweight male in no acute distress BP right arm sitting position 140/90       Assessment & Plan:  Hypertension at goal continue current therapy  Chronic pain back and knee,,,,,,,,,,, followup by orthopedist as soon as he gets insurance

## 2012-03-19 ENCOUNTER — Other Ambulatory Visit: Payer: Self-pay | Admitting: Cardiology

## 2012-04-10 ENCOUNTER — Other Ambulatory Visit: Payer: Self-pay | Admitting: Family Medicine

## 2012-04-10 ENCOUNTER — Telehealth: Payer: Self-pay | Admitting: Family Medicine

## 2012-04-10 NOTE — Telephone Encounter (Signed)
Letter ready for pick up

## 2012-04-10 NOTE — Telephone Encounter (Signed)
Pt needs school note stating his condition will not interfere/nor restrict him for going to class and performing school task.

## 2012-04-11 ENCOUNTER — Other Ambulatory Visit: Payer: Self-pay | Admitting: Family Medicine

## 2012-04-16 ENCOUNTER — Telehealth: Payer: Self-pay | Admitting: Family Medicine

## 2012-04-16 NOTE — Telephone Encounter (Signed)
Patient is aware 

## 2012-04-16 NOTE — Telephone Encounter (Signed)
Per Dr Tawanna Cooler, patient should be now receiving pain medication from pain clinic or ortho.

## 2012-04-16 NOTE — Telephone Encounter (Signed)
Patient states he usually gets #120 hydrocodone and this time he only got #90 okay to fill?

## 2012-04-16 NOTE — Telephone Encounter (Signed)
Pt states he is following up with [facility] concerning hydrocodone refill.  Requesting call from [facility], advised pt it may be later on this evening before call is returned.

## 2012-04-17 ENCOUNTER — Ambulatory Visit: Payer: Self-pay | Admitting: Family Medicine

## 2012-04-21 ENCOUNTER — Emergency Department (HOSPITAL_COMMUNITY): Payer: Medicare Other

## 2012-04-21 ENCOUNTER — Encounter (HOSPITAL_COMMUNITY): Payer: Self-pay | Admitting: Physical Medicine and Rehabilitation

## 2012-04-21 ENCOUNTER — Emergency Department (HOSPITAL_COMMUNITY)
Admission: EM | Admit: 2012-04-21 | Discharge: 2012-04-21 | Disposition: A | Discharge status: Home / Self Care | Payer: Medicare Other | Attending: Emergency Medicine | Admitting: Emergency Medicine

## 2012-04-21 DIAGNOSIS — S6990XA Unspecified injury of unspecified wrist, hand and finger(s), initial encounter: Secondary | ICD-10-CM | POA: Insufficient documentation

## 2012-04-21 DIAGNOSIS — S8990XA Unspecified injury of unspecified lower leg, initial encounter: Secondary | ICD-10-CM | POA: Insufficient documentation

## 2012-04-21 DIAGNOSIS — I10 Essential (primary) hypertension: Secondary | ICD-10-CM | POA: Insufficient documentation

## 2012-04-21 DIAGNOSIS — E781 Pure hyperglyceridemia: Secondary | ICD-10-CM | POA: Insufficient documentation

## 2012-04-21 DIAGNOSIS — F329 Major depressive disorder, single episode, unspecified: Secondary | ICD-10-CM | POA: Insufficient documentation

## 2012-04-21 DIAGNOSIS — Z79899 Other long term (current) drug therapy: Secondary | ICD-10-CM | POA: Insufficient documentation

## 2012-04-21 DIAGNOSIS — Y939 Activity, unspecified: Secondary | ICD-10-CM | POA: Insufficient documentation

## 2012-04-21 DIAGNOSIS — Z8669 Personal history of other diseases of the nervous system and sense organs: Secondary | ICD-10-CM | POA: Insufficient documentation

## 2012-04-21 DIAGNOSIS — Z8719 Personal history of other diseases of the digestive system: Secondary | ICD-10-CM | POA: Insufficient documentation

## 2012-04-21 DIAGNOSIS — W19XXXA Unspecified fall, initial encounter: Secondary | ICD-10-CM

## 2012-04-21 DIAGNOSIS — Z8639 Personal history of other endocrine, nutritional and metabolic disease: Secondary | ICD-10-CM | POA: Insufficient documentation

## 2012-04-21 DIAGNOSIS — W010XXA Fall on same level from slipping, tripping and stumbling without subsequent striking against object, initial encounter: Secondary | ICD-10-CM | POA: Insufficient documentation

## 2012-04-21 DIAGNOSIS — S59909A Unspecified injury of unspecified elbow, initial encounter: Secondary | ICD-10-CM | POA: Insufficient documentation

## 2012-04-21 DIAGNOSIS — Z8679 Personal history of other diseases of the circulatory system: Secondary | ICD-10-CM | POA: Insufficient documentation

## 2012-04-21 DIAGNOSIS — Z862 Personal history of diseases of the blood and blood-forming organs and certain disorders involving the immune mechanism: Secondary | ICD-10-CM | POA: Insufficient documentation

## 2012-04-21 DIAGNOSIS — M25539 Pain in unspecified wrist: Secondary | ICD-10-CM

## 2012-04-21 DIAGNOSIS — F17200 Nicotine dependence, unspecified, uncomplicated: Secondary | ICD-10-CM | POA: Insufficient documentation

## 2012-04-21 DIAGNOSIS — M549 Dorsalgia, unspecified: Secondary | ICD-10-CM | POA: Insufficient documentation

## 2012-04-21 DIAGNOSIS — F3289 Other specified depressive episodes: Secondary | ICD-10-CM | POA: Insufficient documentation

## 2012-04-21 DIAGNOSIS — E669 Obesity, unspecified: Secondary | ICD-10-CM | POA: Insufficient documentation

## 2012-04-21 DIAGNOSIS — Y929 Unspecified place or not applicable: Secondary | ICD-10-CM | POA: Insufficient documentation

## 2012-04-21 DIAGNOSIS — S99919A Unspecified injury of unspecified ankle, initial encounter: Secondary | ICD-10-CM | POA: Insufficient documentation

## 2012-04-21 DIAGNOSIS — G8929 Other chronic pain: Secondary | ICD-10-CM | POA: Insufficient documentation

## 2012-04-21 MED ORDER — HYDROMORPHONE HCL PF 2 MG/ML IJ SOLN
2.0000 mg | Freq: Once | INTRAMUSCULAR | Status: AC
  Administered 2012-04-21: 2 mg via INTRAMUSCULAR
  Filled 2012-04-21: qty 1

## 2012-04-21 MED ORDER — HYDROCODONE-ACETAMINOPHEN 5-325 MG PO TABS: 1.0000 | ORAL_TABLET | Freq: Four times a day (QID) | ORAL | Status: DC | PRN

## 2012-04-21 NOTE — ED Notes (Addendum)
Pt presents to department for evaluation of chronic R knee pain and R wrist pain. States he tripped and fell last night because R knee "gave way." states he landed on R wrist. States he had MRI today. Attempted to get medication for pain, but PCP would not prescribe. 9/10 pain at the time. Able to move all extremities. No obvious deformities noted.

## 2012-04-21 NOTE — ED Provider Notes (Signed)
History   This chart was scribed for non-physician practitioner working with Derwood Kaplan, MD by Leone Payor, ED Scribe. This patient was seen in room TR05C/TR05C and the patient's care was started at 1645.   CSN: [id number]  Arrival date & time 04/21/12  1645   None     Chief Complaint  Patient presents with  . Knee Pain  . Wrist Pain     The history is provided by the patient. No language interpreter was used.    Harold [name] is a 47 y.o. male who presents to the Emergency Department complaining of new, constant, unchanged right wrist pain and chronic knee pain starting 1 day ago. Pt reports his knee "buckled" and fell onto his right wrist. Pt takes hydrocodone and Voltaren gel to control knee pain which he has since run out of. Pt reports having last MRI this morning which was ordered by the orthopedist. Pt was not given pain medication.   Pt has h/o HTN, chronic back pain.  Pt is a current everyday smoker and occasional alcohol user.  Past Medical History  Diagnosis Date  . Obesity   . Metabolic syndrome   . Hypertension   . Depression   . GERD (gastroesophageal reflux disease)   . Chronic back pain   . History of pancreatitis     a. admx 04-2009.Marland KitchenMarland Kitchen? 2-2 triglycerides  . Hypertriglyceridemia     a. followed by [facility]  . Acute pericarditis     admx 03-26-10 thru 03-29-10; a. echo 03-26-10: EF 55-60%; mild LVH; trivial MR; RVF ok; mild -mod circumferential Eff w/o tamponade  . Constrictive pericarditis   . OSA (obstructive sleep apnea)   . Gout     pt denies  . Family history of malignant neoplasm of gastrointestinal tract     Past Surgical History  Procedure Date  . Tonsillectomy and adenoidectomy     one tonsil  . Pericardectomy 07/15/2010    Dr. Tyrone Sage  . Pleurx catheter placement 07/15/2010    Dr. Tyrone Sage  . Shoulder surgery     right and left shoulders  . Knee surgery     right    Family History  Problem Relation Age of Onset  . Diabetes  Father   . Colon cancer Father   . Heart disease Father   . Pancreatitis Father   . Hypertension Other     entire family  . Diabetes Mother   . Liver cancer Paternal Uncle   . Diabetes Sister   . Stomach cancer Maternal Grandfather   . Pancreatitis Paternal Uncle     x 2  . Esophageal cancer Neg Hx   . Rectal cancer Neg Hx     History  Substance Use Topics  . Smoking status: Current Every Day Smoker -- 0.3 packs/day for 20 years    Types: Cigarettes  . Smokeless tobacco: Never Used  . Alcohol Use: 0.0 oz/week     Comment: several times a week and mostly on the weekends      Review of Systems  A complete 10 system review of systems was obtained and all systems are negative except as noted in the HPI and PMH.    Allergies  Colchicine and Diphenhydramine hcl  Home Medications   Current Outpatient Rx  Name  Route  Sig  Dispense  Refill  . ACETAMINOPHEN 500 MG PO TABS   Oral   Take 1,000 mg by mouth every 6 (six) hours as needed. For pain         .  CITALOPRAM HYDROBROMIDE 40 MG PO TABS   Oral   Take 0.5 tablets (20 mg total) by mouth daily.   100 tablet   3   . CYCLOBENZAPRINE HCL 10 MG PO TABS   Oral   Take 10 mg by mouth at bedtime as needed. For muscle spasms         . DICLOFENAC SODIUM 1 % TD GEL   Topical   Apply 1 application topically 4 (four) times daily.   3 Tube   2   . FENOFIBRATE 145 MG PO TABS   Oral   Take 1 tablet (145 mg total) by mouth daily.   100 tablet   3   . FUROSEMIDE 20 MG PO TABS   Oral   Take 1 tablet (20 mg total) by mouth daily.   90 tablet   1   . IBUPROFEN 200 MG PO TABS   Oral   Take 800 mg by mouth every 6 (six) hours as needed. For pain         . LISINOPRIL 20 MG PO TABS   Oral   Take 40 mg by mouth daily.         [name] POTASSIUM CHLORIDE ER 10 MEQ PO TBCR   Oral   Take 10 mEq by mouth daily.         [name] ROSUVASTATIN CALCIUM 10 MG PO TABS   Oral   Take 1 tablet (10 mg total) by mouth daily.   100  tablet   3     BP 180/110  Pulse 75  Temp 97.7 F (36.5 C) (Oral)  Resp 18  SpO2 96%  Physical Exam  Nursing note and vitals reviewed. Constitutional: He is oriented to person, place, and time. He appears well-developed and well-nourished. No distress.  HENT:  Head: Normocephalic and atraumatic.  Eyes: Conjunctivae normal and EOM are normal.  Neck: Normal range of motion. Neck supple.  Cardiovascular:       Intact distal pulses, capillary refill < 3 seconds  Pulmonary/Chest: Effort normal.  Musculoskeletal: Normal range of motion.       Right knee-- Tender to palpation everywhere. Pain with flexion and extension.   Right hand--Tenderness to palpation over 4th and 5th metacarpal and medial wrist.   All other extremities with normal ROM.  Neurological: He is alert and oriented to person, place, and time.       No sensory deficit  Skin: Skin is warm and dry. No rash noted. He is not diaphoretic.       Skin intact, no tenting  Psychiatric: He has a normal mood and affect. His behavior is normal.    ED Course  Procedures (including critical care time)  DIAGNOSTIC STUDIES: Oxygen Saturation is 96% on room air, adequate by my interpretation.    COORDINATION OF CARE:  6:29 PM Discussed treatment plan which includes imaging of right hand and wrist with pt at bedside and pt agreed to plan.    Labs Reviewed - No data to display Dg Wrist Complete Right  04/21/2012  *RADIOLOGY REPORT*  Clinical Data: Status post fall.  Pain.  RIGHT WRIST - COMPLETE 3+ VIEW  Comparison: None.  Findings: There is no fracture or dislocation.  No notable degenerative change is identified.  Soft tissue structures are unremarkable.  IMPRESSION: Negative exam.   Original Report Authenticated By: Holley Dexter, M.D.    Dg Hand Complete Right  04/21/2012  *RADIOLOGY REPORT*  Clinical Data: Fall, pain.  RIGHT HAND -  COMPLETE 3+ VIEW  Comparison: None.  Findings: Imaged bones, joints and soft tissues  appear normal.  IMPRESSION: Negative exam.   Original Report Authenticated By: Holley Dexter, M.D.      No diagnosis found.    MDM  Hypertension, Chronic knee pain, new wrist pain s/p mechanical fall   Patient X-Ray negative for obvious fracture or dislocation.Neurovascularly intact.  Pain managed in ED. Pt advised to follow up with orthopedics if symptoms persist for possibility of missed fracture diagnosis. Patient given brace while in ED, conservative therapy recommended and discussed. Patient will be dc home & is agreeable with above plan.       I personally performed the services described in this documentation, which was scribed in my presence. The recorded information has been reviewed and is accurate.      Jaci Carrel, [place] 04/21/12 1931

## 2012-04-21 NOTE — ED Notes (Addendum)
Pt c/o right wrist pain and chronic knee pain. Pt reports he has knee "buckled" and he fell onto his right lateral wrist. Pt in nad. No obvious swelling or deformity seen on wrist or knee. Pt able to move all digits and wrist up and down. Pt has brace on right knee.

## 2012-04-22 NOTE — ED Provider Notes (Signed)
Medical screening examination/treatment/procedure(s) were performed by non-physician practitioner and as supervising physician I was immediately available for consultation/collaboration.  Derwood Kaplan, MD 04/22/12 1551

## 2012-04-25 ENCOUNTER — Telehealth: Payer: Self-pay

## 2012-04-25 NOTE — Telephone Encounter (Signed)
error 

## 2012-05-01 ENCOUNTER — Other Ambulatory Visit: Payer: Self-pay | Admitting: Family Medicine

## 2012-05-01 NOTE — Telephone Encounter (Signed)
Pt would like refill of HYDROcodone-acetaminophen (NORCO/VICODIN) 5-325 MG per tablet Pt states MD said he would do this May 01, 2012.  Pharm: Human resources officer

## 2012-05-02 NOTE — Telephone Encounter (Signed)
Patient is aware 

## 2012-05-02 NOTE — Telephone Encounter (Signed)
Denied per Dr Tawanna Cooler

## 2012-05-03 ENCOUNTER — Telehealth: Payer: Self-pay | Admitting: Family Medicine

## 2012-05-03 NOTE — Telephone Encounter (Signed)
Pt would like appt for back pain. Can I sch?

## 2012-05-04 NOTE — Telephone Encounter (Signed)
Patient is aware 

## 2012-05-04 NOTE — Telephone Encounter (Signed)
Per Dr Tawanna Cooler patient should go to a pain clinic or call the orthopedic office.

## 2012-05-18 ENCOUNTER — Other Ambulatory Visit: Payer: Self-pay

## 2012-05-18 MED ORDER — DICLOFENAC SODIUM 1 % TD GEL: 1.0000 "application " | Freq: Four times a day (QID) | TRANSDERMAL | Status: DC

## 2012-06-13 ENCOUNTER — Encounter (HOSPITAL_COMMUNITY): Payer: Self-pay | Admitting: Emergency Medicine

## 2012-06-13 ENCOUNTER — Emergency Department (HOSPITAL_COMMUNITY): Payer: Medicare Other

## 2012-06-13 ENCOUNTER — Emergency Department (HOSPITAL_COMMUNITY)
Admission: EM | Admit: 2012-06-13 | Discharge: 2012-06-13 | Disposition: A | Discharge status: Home / Self Care | Payer: Medicare Other | Attending: Emergency Medicine | Admitting: Emergency Medicine

## 2012-06-13 DIAGNOSIS — E669 Obesity, unspecified: Secondary | ICD-10-CM | POA: Insufficient documentation

## 2012-06-13 DIAGNOSIS — Z79899 Other long term (current) drug therapy: Secondary | ICD-10-CM | POA: Insufficient documentation

## 2012-06-13 DIAGNOSIS — F329 Major depressive disorder, single episode, unspecified: Secondary | ICD-10-CM | POA: Insufficient documentation

## 2012-06-13 DIAGNOSIS — Z8739 Personal history of other diseases of the musculoskeletal system and connective tissue: Secondary | ICD-10-CM | POA: Insufficient documentation

## 2012-06-13 DIAGNOSIS — G8918 Other acute postprocedural pain: Secondary | ICD-10-CM | POA: Insufficient documentation

## 2012-06-13 DIAGNOSIS — E781 Pure hyperglyceridemia: Secondary | ICD-10-CM | POA: Insufficient documentation

## 2012-06-13 DIAGNOSIS — Z8679 Personal history of other diseases of the circulatory system: Secondary | ICD-10-CM | POA: Insufficient documentation

## 2012-06-13 DIAGNOSIS — Z862 Personal history of diseases of the blood and blood-forming organs and certain disorders involving the immune mechanism: Secondary | ICD-10-CM | POA: Insufficient documentation

## 2012-06-13 DIAGNOSIS — Z8639 Personal history of other endocrine, nutritional and metabolic disease: Secondary | ICD-10-CM | POA: Insufficient documentation

## 2012-06-13 DIAGNOSIS — Z8669 Personal history of other diseases of the nervous system and sense organs: Secondary | ICD-10-CM | POA: Insufficient documentation

## 2012-06-13 DIAGNOSIS — M7989 Other specified soft tissue disorders: Secondary | ICD-10-CM

## 2012-06-13 DIAGNOSIS — F17200 Nicotine dependence, unspecified, uncomplicated: Secondary | ICD-10-CM | POA: Insufficient documentation

## 2012-06-13 DIAGNOSIS — F3289 Other specified depressive episodes: Secondary | ICD-10-CM | POA: Insufficient documentation

## 2012-06-13 DIAGNOSIS — M25561 Pain in right knee: Secondary | ICD-10-CM

## 2012-06-13 DIAGNOSIS — Z9889 Other specified postprocedural states: Secondary | ICD-10-CM | POA: Insufficient documentation

## 2012-06-13 DIAGNOSIS — Z8719 Personal history of other diseases of the digestive system: Secondary | ICD-10-CM | POA: Insufficient documentation

## 2012-06-13 DIAGNOSIS — I10 Essential (primary) hypertension: Secondary | ICD-10-CM | POA: Insufficient documentation

## 2012-06-13 MED ORDER — HYDROMORPHONE HCL PF 2 MG/ML IJ SOLN
2.0000 mg | Freq: Once | INTRAMUSCULAR | Status: AC
  Administered 2012-06-13: 2 mg via INTRAMUSCULAR
  Filled 2012-06-13: qty 1

## 2012-06-13 NOTE — Progress Notes (Signed)
*  Preliminary Results* Right lower extremity venous duplex completed. Right lower extremity is negative for deep vein thrombosis. No evidence of right Baker's cyst.  06/13/2012 4:41 PM Gertie Fey, RDMS, RDCS

## 2012-06-13 NOTE — ED Notes (Signed)
Patient is on crutches and has some swelling to his right knee

## 2012-06-13 NOTE — ED Provider Notes (Signed)
Medical screening examination/treatment/procedure(s) were conducted as a shared visit with non-physician practitioner(s) and myself.  I personally evaluated the patient during the encounter  Knee pain and swelling s/p arthroscopy 05/29/12 at [facility]. No fever.  Intact DP and PT pulses. Knee effusion. No warmth or erythema. FROM without pain.  Doubt septic joint. F/u with ortho tomorrow.  Glynn Octave, MD 06/13/12 1600

## 2012-06-13 NOTE — Progress Notes (Signed)
Orthopedic Tech Progress Note Patient Details:  Harold [name] 1965/10/16 [id number]  Ortho Devices Type of Ortho Device: Knee Immobilizer Ortho Device/Splint Location: (R) LE Ortho Device/Splint Interventions: Application;Ordered   Jennye Moccasin 06/13/2012, 5:41 PM

## 2012-06-13 NOTE — ED Provider Notes (Addendum)
History     CSN: [id number]  Arrival date & time 06/13/12  1328   First MD Initiated Contact with Patient 06/13/12 1347      Chief Complaint  Patient presents with  . Knee Pain    "I had surgery on 05-29-12 on my menicus I was suppose to have an ultrasound on 06-08-12 but the weather was bad so I didnt make to [facility] to have it done"      (Consider location/radiation/quality/duration/timing/severity/associated sxs/prior treatment) HPI 47 y/o male presents to the ED with cc R knee pain. Recent arthroscopic Knee repair on 06/08/2012 in [place] with [facility].  Patient states the began having swelling and pain after the surgery. Patient was scheduled for an [place] to r/o DVT, however he left before the study was performed due to inclement weather and has not been back for follow up.  He c/o swelling and sever pain in the knee which is worsened with flexion and full extension. He states that his pian medication is not working . Denies cp/ SOB/ hemoptysis.  Denies numbness, pallor, or tingling in the toes. Denies heat, swelling, or calf pain in the LE. Past Medical History  Diagnosis Date  . Obesity   . Metabolic syndrome   . Hypertension   . Depression   . GERD (gastroesophageal reflux disease)   . Chronic back pain   . History of pancreatitis     a. admx 04-2009.Marland KitchenMarland Kitchen? 2-2 triglycerides  . Hypertriglyceridemia     a. followed by [facility]  . Acute pericarditis     admx 03-26-10 thru 03-29-10; a. echo 03-26-10: EF 55-60%; mild LVH; trivial MR; RVF ok; mild -mod circumferential Eff w/o tamponade  . Constrictive pericarditis   . OSA (obstructive sleep apnea)   . Gout     pt denies  . Family history of malignant neoplasm of gastrointestinal tract     Past Surgical History  Procedure Laterality Date  . Tonsillectomy and adenoidectomy      one tonsil  . Pericardectomy  07/15/2010    Dr. Tyrone Sage  . Pleurx catheter placement  07/15/2010    Dr. Tyrone Sage  . Shoulder surgery      right and left  shoulders  . Knee surgery      right    Family History  Problem Relation Age of Onset  . Diabetes Father   . Colon cancer Father   . Heart disease Father   . Pancreatitis Father   . Hypertension Other     entire family  . Diabetes Mother   . Liver cancer Paternal Uncle   . Diabetes Sister   . Stomach cancer Maternal Grandfather   . Pancreatitis Paternal Uncle     x 2  . Esophageal cancer Neg Hx   . Rectal cancer Neg Hx     History  Substance Use Topics  . Smoking status: Current Every Day Smoker -- 10.00 packs/day for 20 years    Types: Cigarettes  . Smokeless tobacco: Never Used  . Alcohol Use: 0.0 oz/week     Comment: several times a week and mostly on the weekends      Review of Systems Ten systems reviewed and are negative for acute change, except as noted in the HPI.   Allergies  Colchicine; Diphenhydramine hcl; and Other  Home Medications   Current Outpatient Rx  Name  Route  Sig  Dispense  Refill  . acetaminophen (TYLENOL) 500 MG tablet   Oral   Take 1,000 mg  by mouth every 6 (six) hours as needed. For pain         . citalopram (CELEXA) 40 MG tablet   Oral   Take 0.5 tablets (20 mg total) by mouth daily.   100 tablet   3   . cyclobenzaprine (FLEXERIL) 10 MG tablet   Oral   Take 10 mg by mouth at bedtime as needed. For muscle spasms         . diclofenac sodium (VOLTAREN) 1 % GEL   Topical   Apply 1 application topically 4 (four) times daily.   3 Tube   2   . fenofibrate (TRICOR) 145 MG tablet   Oral   Take 1 tablet (145 mg total) by mouth daily.   100 tablet   3   . furosemide (LASIX) 20 MG tablet   Oral   Take 1 tablet (20 mg total) by mouth daily.   90 tablet   1   . HYDROcodone-acetaminophen (NORCO/VICODIN) 5-325 MG per tablet   Oral   Take 1 tablet by mouth every 6 (six) hours as needed for pain.   6 tablet   0   . ibuprofen (ADVIL,MOTRIN) 200 MG tablet   Oral   Take 800 mg by mouth every 6 (six) hours as needed. For  pain         . lisinopril (PRINIVIL,ZESTRIL) 20 MG tablet   Oral   Take 40 mg by mouth daily.         . potassium chloride (K-DUR) 10 MEQ tablet   Oral   Take 10 mEq by mouth daily.         . rosuvastatin (CRESTOR) 10 MG tablet   Oral   Take 1 tablet (10 mg total) by mouth daily.   100 tablet   3     BP 172/109  Pulse 74  Temp(Src) 98.5 F (36.9 C) (Oral)  Ht 5\' 11"  (1.803 m)  Wt 263 lb (119.296 kg)  BMI 36.7 kg/m2  SpO2 98%  Physical Exam  Nursing note and vitals reviewed. Constitutional: He appears well-developed and well-nourished. No distress.  HENT:  Head: Normocephalic and atraumatic.  Eyes: Conjunctivae are normal. No scleral icterus.  Neck: Normal range of motion. Neck supple.  Cardiovascular: Normal rate, regular rhythm and normal heart sounds.   Pulmonary/Chest: Effort normal and breath sounds normal. No respiratory distress.  Abdominal: Soft. There is no tenderness.  Musculoskeletal: He exhibits no edema.  A right knee exam was performed. SKIN: intact and incision clean and dry SWELLING: moderate EFFUSION: yes WARMTH: no warmth TENDERNESS: none and diffuse ROM: limited by pain   Neurological: He is alert.  Skin: Skin is warm and dry. He is not diaphoretic.  Psychiatric: His behavior is normal.    ED Course  Procedures (including critical care time)  Labs Reviewed - No data to display Dg Knee Complete 4 Views Right  06/13/2012  *RADIOLOGY REPORT*  Clinical Data: Pain and swelling since the surgery 05/29/2012.  RIGHT KNEE - COMPLETE 4+ VIEW  Comparison: 10/05/2009.  Findings: Findings suggestive of remote proximal right fibular fracture unchanged from prior exam.  Mild tricompartment degenerative changes most notable medial tibiofemoral joint space and patellofemoral joint space.  Moderate to large suprapatellar joint effusion. Etiology indeterminate.  Postoperative infection not excluded if of clinical concern.  IMPRESSION: Moderate to large  suprapatellar joint effusion. Etiology indeterminate.  Postoperative infection not excluded if of clinical concern.  Mild tricompartment degenerative changes most notable medial tibiofemoral joint space  and patellofemoral joint space.   Original Report Authenticated By: Lacy Duverney, M.D.      1. Knee pain, right       MDM  3:36 PM BP 172/109  Pulse 74  Temp(Src) 98.5 F (36.9 C) (Oral)  Ht 5\' 11"  (1.803 m)  Wt 263 lb (119.296 kg)  BMI 36.7 kg/m2  SpO2 98% Patient s/p left knee arthroscopy approx. 2 weeks ago.  Xray shows large suprapatellar effusion.  I have spoken with Scot Dock PA-C at [facility] in [place] who requests DVT r/o and patient f/u at 9:00 am tomorrow for joint aspiration.  I do not suspect septic arthritis or dvt.  Patient has good ROM. DVT study ordered. Patient requests pain medicine. Will dc with bulky jonesdressing and knee immobilizer. I have given report to PA Laveda Norman who will assume care. Patient seen in shared visit with Dr. Leandro Reasoner, PA-C 06/13/12 1544  Arthor Captain, PA-C 07/09/12 0009  Arthor Captain, PA-C 07/30/12 1537

## 2012-06-21 ENCOUNTER — Ambulatory Visit (INDEPENDENT_AMBULATORY_CARE_PROVIDER_SITE_OTHER): Payer: Medicare Other | Admitting: Family Medicine

## 2012-06-21 ENCOUNTER — Encounter: Payer: Self-pay | Admitting: Family Medicine

## 2012-06-21 VITALS — BP 150/70 | Temp 98.6°F

## 2012-06-21 DIAGNOSIS — J069 Acute upper respiratory infection, unspecified: Secondary | ICD-10-CM

## 2012-06-21 MED ORDER — BENZONATATE 200 MG PO CAPS: 200.0000 mg | ORAL_CAPSULE | Freq: Three times a day (TID) | ORAL | Status: DC | PRN

## 2012-06-21 MED ORDER — MAGIC MOUTHWASH: 10.0000 mL | Freq: Four times a day (QID) | ORAL | Status: DC | PRN

## 2012-06-21 NOTE — Progress Notes (Signed)
  Subjective:    Patient ID: Harold [name], male    DOB: 06-20-1965, 47 y.o.   MRN: [id number]  HPI Acute visit. Acute respiratory illness which started last Sunday. Developed some nasal congestion and cough. Intermittent mild headaches. Question of low-grade fever off and on. Not sleeping well at night secondary to cough. Started some DayQuil mild relief. He has some mild body aches and fatigue. Has some persistent sore throat symptoms. Ibuprofen helps. Wife developing similar symptoms  Patient recent arthroscopic knee surgery without complication. No pleuritic pain. No hemoptysis. No dyspnea.   Review of Systems  Constitutional: Positive for fatigue. Negative for chills.  HENT: Positive for congestion and sore throat.   Respiratory: Positive for cough.   Musculoskeletal: Positive for myalgias.  Neurological: Positive for headaches.       Objective:   Physical Exam  Constitutional: He appears well-developed and well-nourished.  HENT:  Right Ear: External ear normal.  Left Ear: External ear normal.  Mild posterior pharynx erythema. No exudate  Neck: Neck supple.  Cardiovascular: Normal rate and regular rhythm.   Pulmonary/Chest: Effort normal and breath sounds normal. No respiratory distress. He has no wheezes. He has no rales.  Lymphadenopathy:    He has no cervical adenopathy.          Assessment & Plan:  Viral URI. Tessalon Perles 200 mg every 8 hours as needed. Patient requesting refill Magic mouthwash for sore throat symptoms. Plenty of fluids. Followup promptly for fever or worsening symptoms

## 2012-06-22 ENCOUNTER — Telehealth: Payer: Self-pay | Admitting: *Deleted

## 2012-06-22 MED ORDER — FIRST-DUKES MOUTHWASH MT SUSP: 10.0000 mL | Freq: Four times a day (QID) | OROMUCOSAL | Status: DC | PRN

## 2012-06-22 NOTE — Telephone Encounter (Signed)
Pharmacy requesting additional information re magic mouthwash.

## 2012-06-22 NOTE — Telephone Encounter (Signed)
Rx re-sent to pharmacy, pt informed on VM

## 2012-06-22 NOTE — Telephone Encounter (Signed)
See rx for [facility] Magic Mouthwash.

## 2012-07-09 NOTE — ED Provider Notes (Signed)
Medical screening examination/treatment/procedure(s) were conducted as a shared visit with non-physician practitioner(s) and myself.  I personally evaluated the patient during the encounter  Glynn Octave, MD 07/09/12 1118

## 2012-07-23 ENCOUNTER — Telehealth: Payer: Self-pay | Admitting: Family Medicine

## 2012-07-24 ENCOUNTER — Telehealth: Payer: Self-pay | Admitting: Family Medicine

## 2012-07-24 NOTE — Telephone Encounter (Signed)
Unable to fill celexa at with new directions. No early refills per Dr Tawanna Cooler

## 2012-07-24 NOTE — Telephone Encounter (Signed)
Fleet Contras please call him on.......... Since he is having the symptoms he describes we recommend he go to behavioral health for an evaluation. It's right behind are main office on [address]

## 2012-07-24 NOTE — Telephone Encounter (Signed)
Pt called in to inquire about his refill. Upon telling him that his request for an early refill was declined, he stated "this dude is trippin", and then proceeded to ask for an appointment to come in and speak with Dr. Tawanna Cooler. Should I schedule him this appointment?

## 2012-07-24 NOTE — Telephone Encounter (Signed)
Okay to fill? 

## 2012-07-24 NOTE — Telephone Encounter (Signed)
[address] [place], [place] [address] p. [phone] f. [phone] To: [facility] (After Hours Triage) Fax: [phone] From: [facility] Date/ Time: 07/23/2012 5:31 PM Taken By: Jethro BolusWindy Fast Facility: home Patient: Harold [name], Harold [name] DOB: 1965/10/11 Phone: [phone] Reason for Call: Patient needs refill of Celexa. He reports increasing anxiety at the end of March and used double the normal dosage of his medication as directed by Dr. Tawanna Cooler. He now needs a refill of the medication and the pharmacy will not give the refill due to it not being time for the medication yet. Please contact patient regarding this refill request. Regarding Appointment:

## 2012-07-25 ENCOUNTER — Ambulatory Visit: Payer: Medicare Other | Admitting: Family Medicine

## 2012-07-25 NOTE — Telephone Encounter (Signed)
Spoke with patient.

## 2012-07-30 NOTE — ED Provider Notes (Signed)
Medical screening examination/treatment/procedure(s) were conducted as a shared visit with non-physician practitioner(s) and myself.  I personally evaluated the patient during the encounter  See my additional note  Glynn Octave, MD 07/30/12 2243

## 2012-08-14 ENCOUNTER — Telehealth: Payer: Self-pay | Admitting: Internal Medicine

## 2012-08-14 NOTE — Telephone Encounter (Signed)
Ok with me 

## 2012-08-14 NOTE — Telephone Encounter (Signed)
Patient is requesting to switch from Dr. Tawanna Cooler to Dr. Jonny Ruiz, per sister(Yvonnia Montez Morita) this is OK per Dr. Jonny Ruiz, please advise?

## 2012-08-15 NOTE — Telephone Encounter (Signed)
Called left message to call back 

## 2012-08-16 NOTE — Telephone Encounter (Signed)
Patient informed. 

## 2012-09-07 ENCOUNTER — Other Ambulatory Visit: Payer: Self-pay

## 2012-09-07 ENCOUNTER — Telehealth: Payer: Self-pay | Admitting: Family Medicine

## 2012-09-07 MED ORDER — POTASSIUM CHLORIDE ER 10 MEQ PO TBCR: 10.0000 meq | EXTENDED_RELEASE_TABLET | Freq: Every day | ORAL | Status: DC

## 2012-09-07 NOTE — Telephone Encounter (Signed)
This has been denied.  Patient of Dr Jonny Ruiz

## 2012-09-07 NOTE — Telephone Encounter (Signed)
PT called to request a refill of his potassium chloride (K-DUR) 10 MEQ tablet. He would like it sent to [facility] on [address]. Please assist.

## 2012-09-14 ENCOUNTER — Ambulatory Visit (INDEPENDENT_AMBULATORY_CARE_PROVIDER_SITE_OTHER): Payer: Medicare Other | Admitting: Internal Medicine

## 2012-09-14 ENCOUNTER — Encounter: Payer: Self-pay | Admitting: Internal Medicine

## 2012-09-14 ENCOUNTER — Other Ambulatory Visit (INDEPENDENT_AMBULATORY_CARE_PROVIDER_SITE_OTHER): Payer: Medicare Other

## 2012-09-14 VITALS — BP 132/82 | HR 68 | Temp 98.4°F | Ht 71.0 in | Wt 261.4 lb

## 2012-09-14 DIAGNOSIS — N529 Male erectile dysfunction, unspecified: Secondary | ICD-10-CM | POA: Insufficient documentation

## 2012-09-14 DIAGNOSIS — J309 Allergic rhinitis, unspecified: Secondary | ICD-10-CM | POA: Insufficient documentation

## 2012-09-14 DIAGNOSIS — M5432 Sciatica, left side: Secondary | ICD-10-CM | POA: Insufficient documentation

## 2012-09-14 DIAGNOSIS — Z0000 Encounter for general adult medical examination without abnormal findings: Secondary | ICD-10-CM

## 2012-09-14 DIAGNOSIS — E8881 Metabolic syndrome: Secondary | ICD-10-CM

## 2012-09-14 DIAGNOSIS — Z23 Encounter for immunization: Secondary | ICD-10-CM

## 2012-09-14 DIAGNOSIS — M5136 Other intervertebral disc degeneration, lumbar region: Secondary | ICD-10-CM

## 2012-09-14 DIAGNOSIS — L84 Corns and callosities: Secondary | ICD-10-CM

## 2012-09-14 DIAGNOSIS — Z0001 Encounter for general adult medical examination with abnormal findings: Secondary | ICD-10-CM | POA: Insufficient documentation

## 2012-09-14 DIAGNOSIS — M51369 Other intervertebral disc degeneration, lumbar region without mention of lumbar back pain or lower extremity pain: Secondary | ICD-10-CM

## 2012-09-14 DIAGNOSIS — E781 Pure hyperglyceridemia: Secondary | ICD-10-CM

## 2012-09-14 DIAGNOSIS — M79609 Pain in unspecified limb: Secondary | ICD-10-CM

## 2012-09-14 HISTORY — DX: Other intervertebral disc degeneration, lumbar region: M51.36

## 2012-09-14 HISTORY — DX: Male erectile dysfunction, unspecified: N52.9

## 2012-09-14 HISTORY — DX: Sciatica, left side: M54.32

## 2012-09-14 HISTORY — DX: Allergic rhinitis, unspecified: J30.9

## 2012-09-14 HISTORY — DX: Other intervertebral disc degeneration, lumbar region without mention of lumbar back pain or lower extremity pain: M51.369

## 2012-09-14 LAB — HEPATIC FUNCTION PANEL
ALT: 18 U/L (ref 0–53)
AST: 24 U/L (ref 0–37)
Albumin: 4.1 g/dL (ref 3.5–5.2)
Alkaline Phosphatase: 76 U/L (ref 39–117)
Bilirubin, Direct: 0.1 mg/dL (ref 0.0–0.3)
Total Bilirubin: 0.9 mg/dL (ref 0.3–1.2)
Total Protein: 7 g/dL (ref 6.0–8.3)

## 2012-09-14 LAB — CBC WITH DIFFERENTIAL/PLATELET
Basophils Absolute: 0 10*3/uL (ref 0.0–0.1)
Basophils Relative: 0.3 % (ref 0.0–3.0)
Eosinophils Absolute: 0.2 10*3/uL (ref 0.0–0.7)
Eosinophils Relative: 2.1 % (ref 0.0–5.0)
HCT: 47 % (ref 39.0–52.0)
Hemoglobin: 15.6 g/dL (ref 13.0–17.0)
Lymphocytes Relative: 19.4 % (ref 12.0–46.0)
Lymphs Abs: 1.9 10*3/uL (ref 0.7–4.0)
MCHC: 33.1 g/dL (ref 30.0–36.0)
MCV: 98.6 fl (ref 78.0–100.0)
Monocytes Absolute: 1.1 10*3/uL — ABNORMAL HIGH (ref 0.1–1.0)
Monocytes Relative: 10.6 % (ref 3.0–12.0)
Neutro Abs: 6.8 10*3/uL (ref 1.4–7.7)
Neutrophils Relative %: 67.6 % (ref 43.0–77.0)
Platelets: 304 10*3/uL (ref 150.0–400.0)
RBC: 4.77 Mil/uL (ref 4.22–5.81)
RDW: 13.9 % (ref 11.5–14.6)
WBC: 10 10*3/uL (ref 4.5–10.5)

## 2012-09-14 LAB — URINALYSIS, ROUTINE W REFLEX MICROSCOPIC
Bilirubin Urine: NEGATIVE
Hgb urine dipstick: NEGATIVE
Ketones, ur: NEGATIVE
Leukocytes, UA: NEGATIVE
Nitrite: NEGATIVE
Specific Gravity, Urine: >=1.03 (ref 1.000–1.030)
Total Protein, Urine: NEGATIVE
Urine Glucose: NEGATIVE
Urobilinogen, UA: 0.2 (ref 0.0–1.0)
pH: 5.5 (ref 5.0–8.0)

## 2012-09-14 LAB — LIPID PANEL
Cholesterol: 207 mg/dL — ABNORMAL HIGH (ref 0–200)
HDL: 32.3 mg/dL — ABNORMAL LOW (ref >39.00)
Total CHOL/HDL Ratio: 6
Triglycerides: 286 mg/dL — ABNORMAL HIGH (ref 0.0–149.0)
VLDL: 57.2 mg/dL — ABNORMAL HIGH (ref 0.0–40.0)

## 2012-09-14 LAB — BASIC METABOLIC PANEL
BUN: 13 mg/dL (ref 6–23)
CO2: 25 mEq/L (ref 19–32)
Calcium: 9.8 mg/dL (ref 8.4–10.5)
Chloride: 102 mEq/L (ref 96–112)
Creatinine, Ser: 1 mg/dL (ref 0.4–1.5)
GFR: 101.51 mL/min (ref >60.00)
Glucose, Bld: 125 mg/dL — ABNORMAL HIGH (ref 70–99)
Potassium: 4.4 mEq/L (ref 3.5–5.1)
Sodium: 136 mEq/L (ref 135–145)

## 2012-09-14 LAB — TSH: TSH: 0.63 u[IU]/mL (ref 0.35–5.50)

## 2012-09-14 LAB — LDL CHOLESTEROL, DIRECT: Direct LDL: 124.5 mg/dL

## 2012-09-14 LAB — PSA: PSA: 1.25 ng/mL (ref 0.10–4.00)

## 2012-09-14 MED ORDER — CITALOPRAM HYDROBROMIDE 40 MG PO TABS: 40.0000 mg | ORAL_TABLET | Freq: Every day | ORAL | Status: DC

## 2012-09-14 MED ORDER — SILDENAFIL CITRATE 100 MG PO TABS
50.0000 mg | ORAL_TABLET | Freq: Every day | ORAL | Status: DC | PRN
Start: 2012-09-14 — End: 2013-03-08

## 2012-09-14 MED ORDER — ASPIRIN 81 MG PO TBEC: 81.0000 mg | DELAYED_RELEASE_TABLET | Freq: Every day | ORAL | Status: DC

## 2012-09-14 MED ORDER — FENOFIBRATE 145 MG PO TABS: 145.0000 mg | ORAL_TABLET | Freq: Every day | ORAL | Status: DC

## 2012-09-14 MED ORDER — POTASSIUM CHLORIDE ER 10 MEQ PO TBCR: 10.0000 meq | EXTENDED_RELEASE_TABLET | Freq: Every day | ORAL | Status: DC

## 2012-09-14 MED ORDER — DICLOFENAC SODIUM 1 % TD GEL: 1.0000 "application " | Freq: Four times a day (QID) | TRANSDERMAL | Status: DC

## 2012-09-14 MED ORDER — LISINOPRIL 20 MG PO TABS: 40.0000 mg | ORAL_TABLET | Freq: Every day | ORAL | Status: DC

## 2012-09-14 MED ORDER — ATORVASTATIN CALCIUM 20 MG PO TABS: 20.0000 mg | ORAL_TABLET | Freq: Every day | ORAL | Status: DC

## 2012-09-14 MED ORDER — FUROSEMIDE 20 MG PO TABS: 20.0000 mg | ORAL_TABLET | Freq: Every day | ORAL | Status: DC
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
## 2012-09-14 NOTE — Assessment & Plan Note (Signed)
For podiatry referral as well

## 2012-09-14 NOTE — Patient Instructions (Addendum)
You had the tetanus shot today OK to stop the crestor as you have Please take all new medication as prescribed  -  The lipitor 20 mg You should also take Aspirin 81 mgt - 1 per day (coated only) OK to increase the celexa to 40 mg per day (new prescription given today) Please continue all other medications as before, and refills have been done if requested You will be contacted regarding the referral for: podiatry Please continue your efforts at being more active, low cholesterol diet, and weight control. You are otherwise up to date with prevention measures today. Please go to the LAB in the Basement (turn left off the elevator) for the tests to be done today You will be contacted by phone if any changes need to be made immediately.  Otherwise, you will receive a letter about your results with an explanation, but please check with MyChart first. Please remember to sign up for My Chart if you have not done so, as this will be important to you in the future with finding out test results, communicating by private email, and scheduling acute appointments online when needed. Please return in 6 months, or sooner if needed, with Lab testing done 3-5 days before

## 2012-09-14 NOTE — Assessment & Plan Note (Signed)

## 2012-09-14 NOTE — Addendum Note (Signed)
Addended by: Scharlene Gloss B on: 09/14/2012 10:39 AM   Modules accepted: Orders

## 2012-10-02 ENCOUNTER — Telehealth: Payer: Self-pay | Admitting: *Deleted

## 2012-10-02 NOTE — Telephone Encounter (Signed)
Harold [name], called requesting the status of the Disability Act form from [facility] which was left at last OV.  Further states form is due back at the school on or before 7.20.2014.  Ms Freida Busman states she gave the form to Dr Jonny Ruiz during the appointment.  Please advise

## 2012-10-02 NOTE — Telephone Encounter (Signed)
Not sure, I will forward to carolyn

## 2012-10-17 ENCOUNTER — Telehealth: Payer: Self-pay | Admitting: *Deleted

## 2012-10-17 NOTE — Telephone Encounter (Signed)
Harold [name] called states [employer] never received the forms.  Forms printed from chart, advised to pickup hard copy.

## 2012-11-29 DIAGNOSIS — M25569 Pain in unspecified knee: Secondary | ICD-10-CM | POA: Insufficient documentation

## 2013-01-09 ENCOUNTER — Encounter: Payer: Self-pay | Admitting: Internal Medicine

## 2013-01-09 ENCOUNTER — Ambulatory Visit (INDEPENDENT_AMBULATORY_CARE_PROVIDER_SITE_OTHER): Payer: Medicare Other | Admitting: Internal Medicine

## 2013-01-09 VITALS — BP 154/100 | HR 81 | Temp 98.2°F | Ht 71.0 in | Wt 250.1 lb

## 2013-01-09 DIAGNOSIS — M25561 Pain in right knee: Secondary | ICD-10-CM

## 2013-01-09 DIAGNOSIS — M25569 Pain in unspecified knee: Secondary | ICD-10-CM

## 2013-01-09 DIAGNOSIS — M549 Dorsalgia, unspecified: Secondary | ICD-10-CM

## 2013-01-09 DIAGNOSIS — R269 Unspecified abnormalities of gait and mobility: Secondary | ICD-10-CM

## 2013-01-09 DIAGNOSIS — H109 Unspecified conjunctivitis: Secondary | ICD-10-CM

## 2013-01-09 DIAGNOSIS — G8929 Other chronic pain: Secondary | ICD-10-CM

## 2013-01-09 DIAGNOSIS — R2689 Other abnormalities of gait and mobility: Secondary | ICD-10-CM

## 2013-01-09 DIAGNOSIS — I509 Heart failure, unspecified: Secondary | ICD-10-CM

## 2013-01-09 MED ORDER — HYDROCODONE-ACETAMINOPHEN 7.5-325 MG PO TABS: 1.0000 | ORAL_TABLET | Freq: Four times a day (QID) | ORAL | Status: DC | PRN

## 2013-01-09 MED ORDER — TOBRAMYCIN 0.3 % OP SOLN: 1.0000 [drp] | Freq: Four times a day (QID) | OPHTHALMIC | Status: DC

## 2013-01-09 NOTE — Patient Instructions (Addendum)
Please take all new medication as prescribed - the pain medication, and the antibiotic eye drops  Please continue all other medications as before, and refills have been done if requested.  Please have the pharmacy call with any other refills you may need.  Your forms will be sent as soon as we are able to get these done (may be 2-3 days)  Please return in 3 months, or sooner if needed

## 2013-01-09 NOTE — Progress Notes (Signed)
Subjective:    Patient ID: Harold [name], male    DOB: 1965/11/15, 47 y.o.   MRN: [id number]  HPI  Here for Mobility exam, the :"face to face examination."  Pt is asking for mobility assistance with Power Scooter (POV) .  Lives with fiancess and children but over the past year the mobility has gradually worsened, such that she is no longer able to compensate well for him.  Pt continues to have recurring mod to severe LBP across the lower back, worse to sit for several minutes only, then worse to stand for several minutes as well, as well as worse to bend or stand up, but no bowel or bladder change, fever, wt loss,  worsening LE pain/numbness/weakness. This has required ongoing narcotic oral med treatment.  Also has had worsening severe right knee pain in the past year, requiring right knee arthroscopy in mar 2014, with f/u repeat surgury Sept 4, 2014 with placement of "biocartilage."  Unfortunately pain, swelling has persisted though has participated in PT and regular ortho f/u.  Lives in split level home, cannot go up stairs due to pain. May eventually need right knee replacement per pt.  Currently here with crutches for the exam but required assist per fiancee as well to ambulate 100 ft from parking lot.  Has fallen total of 5 times in the past month at home with the right knee giveaway, can only ambulate slowly about 10 ft about in the home with assist of crutch and another person.  Cane not helpful as still fell with that.  Walker not likely to help as will not prevent right knee giveaway and fall. Manual wheelchair not helpful per pt as he has tried this, and wheels spin on the floor surface in the home, not able to propel forward. States would need scooter rather than power wheelchair as sitting in a wheelchair likely would exacerbate his back pain, and has room in the home for scooter.  He is willing and motivated to operate a scooter.  He is currently unable to perform ADLs of bathing such as standing  in the shower (is working on getting shower chair), standing for grooming and dressing and cooking or toileting, though is able to sit for toileting.   All requires approx over 75% assist from another person to move about the home to accomplish these tasks.  There is no difficulty with feeding as he has no UE impairment, can sit to do so, though all his meals must be prepared for him.    Incidentally with right eye discomfort, slight d/c, redness, and ? Low grade temp since yesterday. Past Medical History  Diagnosis Date  . Obesity   . Metabolic syndrome   . Hypertension   . Depression   . GERD (gastroesophageal reflux disease)   . Chronic back pain   . History of pancreatitis     a. admx 04-2009.Marland KitchenMarland Kitchen? 2-2 triglycerides  . Hypertriglyceridemia     a. followed by [facility]  . Acute pericarditis     admx 03-26-10 thru 03-29-10; a. echo 03-26-10: EF 55-60%; mild LVH; trivial MR; RVF ok; mild -mod circumferential Eff w/o tamponade  . Constrictive pericarditis   . OSA (obstructive sleep apnea)   . Gout     pt denies  . Family history of malignant neoplasm of gastrointestinal tract   . Allergic rhinitis, cause unspecified 09/14/2012  . DDD (degenerative disc disease), lumbar 09/14/2012  . Left sided sciatica 09/14/2012  . Erectile dysfunction 09/14/2012  Past Surgical History  Procedure Laterality Date  . Tonsillectomy and adenoidectomy      one tonsil  . Pericardectomy  07/15/2010    Dr. Tyrone Sage  . Pleurx catheter placement  07/15/2010    Dr. Tyrone Sage  . Shoulder surgery      right and left shoulders  . Knee surgery      right    reports that he has been smoking Cigarettes.  He has a 200 pack-year smoking history. He has never used smokeless tobacco. He reports that he drinks alcohol. He reports that he does not use illicit drugs. family history includes Colon cancer in his father; Diabetes in his father, mother, and sister; Heart disease in his father; Hypertension in his other;  Liver cancer in his paternal uncle; Pancreatitis in his father and paternal uncle; Stomach cancer in his maternal grandfather. There is no history of Esophageal cancer or Rectal cancer. Allergies  Allergen Reactions  . Colchicine     REACTION: diarrhea  . Diphenhydramine Hcl     REACTION: itchingm :jittery"  . Other Other (See Comments)    Pecans Reaction: itching and swelling of the tongue    Current Outpatient Prescriptions on File Prior to Visit  Medication Sig Dispense Refill  . acetaminophen (TYLENOL) 500 MG tablet Take 1,000 mg by mouth every 6 (six) hours as needed. For pain      . aspirin 81 MG EC tablet Take 1 tablet (81 mg total) by mouth daily. Swallow whole.  30 tablet  12  . atorvastatin (LIPITOR) 20 MG tablet Take 1 tablet (20 mg total) by mouth daily.  90 tablet  3  . citalopram (CELEXA) 40 MG tablet Take 1 tablet (40 mg total) by mouth daily.  90 tablet  3  . cyclobenzaprine (FLEXERIL) 10 MG tablet Take 10 mg by mouth at bedtime as needed. For muscle spasms      . diclofenac sodium (VOLTAREN) 1 % GEL Apply 1 application topically 4 (four) times daily.  3 Tube  2  . fenofibrate (TRICOR) 145 MG tablet Take 1 tablet (145 mg total) by mouth daily.  90 tablet  3  . furosemide (LASIX) 20 MG tablet Take 1 tablet (20 mg total) by mouth daily.  90 tablet  3  . ibuprofen (ADVIL,MOTRIN) 200 MG tablet Take 800 mg by mouth every 6 (six) hours as needed. For pain      . lisinopril (PRINIVIL,ZESTRIL) 20 MG tablet Take 2 tablets (40 mg total) by mouth daily.  180 tablet  3  . potassium chloride (K-DUR) 10 MEQ tablet Take 1 tablet (10 mEq total) by mouth daily.  90 tablet  3  . sildenafil (VIAGRA) 100 MG tablet Take 0.5-1 tablets (50-100 mg total) by mouth daily as needed for erectile dysfunction.  5 tablet  11  . [DISCONTINUED] amoxicillin-clarithromycin-lansoprazole (PREVPAC) combo pack Take by mouth 2 (two) times daily. Follow package directions.  1 kit  0   No current  facility-administered medications on file prior to visit.     Review of Systems  Constitutional: Negative for unexpected weight change, or unusual diaphoresis  HENT: Negative for tinnitus.   Eyes: Negative for photophobia and visual disturbance.  Respiratory: Negative for choking and stridor.   Gastrointestinal: Negative for vomiting and blood in stool.  Genitourinary: Negative for hematuria and decreased urine volume.  Musculoskeletal: Negative for acute joint swelling Skin: Negative for color change and wound.  Neurological: Negative for tremors and numbness other than noted  Psychiatric/Behavioral: Negative  for decreased concentration or  hyperactivity.       Objective:   Physical Exam BP 154/100  Pulse 81  Temp(Src) 98.2 F (36.8 C) (Oral)  Ht 5\' 11"  (1.803 m)  Wt 250 lb 2 oz (113.456 kg)  BMI 34.9 kg/m2  SpO2 94% VS noted, has diffuse general weakness but able to get up on exam table with mild assist putting wt on left knee and using arms Constitutional: Pt is oriented to person, place, and time. Appears severe obese Head: Normocephalic and atraumatic.  Right Ear: External ear normal.  Left Ear: External ear normal.  Nose: Nose normal.  Mouth/Throat: Oropharynx is clear and moist.  Eyes: Conjunctivae and EOM are normal. Pupils are equal, round, and reactive to light.  Neck: Normal range of motion. Neck supple. No JVD present. No tracheal deviation present.  Cardiovascular: Normal rate, regular rhythm, normal heart sounds and intact distal pulses.   Pulmonary/Chest: Effort normal and breath sounds normal.  Abdominal: Soft. Bowel sounds are normal. There is no tenderness. No HSM  Spine nontender, has bilat lumbar diffuse paravertebral tender without swelling, red/rash Musculoskeletal: Normal range of motion except for reduced lumbar extension flexion, right knee with extension to 70 degrees only, flexion to 80 degrees only, with 2+ effusion, diffuse tender, mild warmth, can  bear minimal wt, limps to walk with crutches; joint exam o/w intact without synovitis Lymphadenopathy:  Has no cervical adenopathy.  Neurological: Pt is alert and oriented to person, place, and time. Pt has normal reflexes. No cranial nerve deficit. Motor 5/5 but overall reduced power than expected for his size and muscularity, dtr intact except no right patellar, sens intact to LT to all extremities, gait severe antalgic with crutches only favoring the RLE, unsteady with mod to high risk of fall, unable to assess balance otherwise Skin: Skin is warm and dry. No rash noted. trace LE edema bilat Psychiatric:  Has  normal mood and affect. Behavior is normal. except for mild nervous    Assessment & Plan:

## 2013-01-10 NOTE — Assessment & Plan Note (Signed)
stable overall by history and exam, recent data reviewed with pt, and pt to continue medical treatment as before,  to f/u any worsening symptoms or concerns Lab Results  Component Value Date   WBC 10.0 09/14/2012   HGB 15.6 09/14/2012   HCT 47.0 09/14/2012   PLT 304.0 09/14/2012   GLUCOSE 125* 09/14/2012   CHOL 207* 09/14/2012   TRIG 286.0* 09/14/2012   HDL 32.30* 09/14/2012   LDLDIRECT 124.5 09/14/2012   LDLCALC 76 12/14/2010   ALT 18 09/14/2012   AST 24 09/14/2012   NA 136 09/14/2012   K 4.4 09/14/2012   CL 102 09/14/2012   CREATININE 1.0 09/14/2012   BUN 13 09/14/2012   CO2 25 09/14/2012   TSH 0.63 09/14/2012   PSA 1.25 09/14/2012   INR 1.72* 07/05/2010   HGBA1C 6.3 04/12/2011   MICROALBUR 1.6 04/12/2011    

## 2013-01-10 NOTE — Assessment & Plan Note (Signed)
Due to back and knee pain, for scooter today as above

## 2013-01-10 NOTE — Assessment & Plan Note (Addendum)
Severe ongoing now s/p surg x 2 with frequent giveaway and falls with inability to perform ADLs without assist, I agree with pt request for Scooter, and forms to be filled out today  Note:  Total time for pt hx, exam, review of record with pt in the room, determination of diagnoses and plan for further eval and tx is > 40 min, with over 50% spent in coordination and counseling of patient

## 2013-01-10 NOTE — Assessment & Plan Note (Signed)
Incidental - Mild to mod, for antibx course,  to f/u any worsening symptoms or concerns

## 2013-01-10 NOTE — Assessment & Plan Note (Addendum)
An element also causing impairment, just not as severe as impairment from the right knee , has known hx of lumbar disc dz, but still is quite significant, for pain control/pain med refill

## 2013-01-18 ENCOUNTER — Telehealth: Payer: Self-pay

## 2013-01-18 NOTE — Telephone Encounter (Signed)
I see no need for change in prescriptoin  He had 3 recent rx, all dated correctly  Please inform pharmacy

## 2013-01-18 NOTE — Telephone Encounter (Signed)
Patient informed. 

## 2013-01-18 NOTE — Telephone Encounter (Signed)
Phone call from patient stating the pharmacy can not fill his Hydrocodone. His insurance will not pay unless the signature is written 1-2 every 4-6 hours. Then he mentioned a different sig. Patient was very confusing and unclear when I was trying to speak to him. I did not call the pharmacy.

## 2013-01-18 NOTE — Telephone Encounter (Signed)
Pt is requesting call back.  °

## 2013-03-01 ENCOUNTER — Other Ambulatory Visit: Payer: Self-pay | Admitting: *Deleted

## 2013-03-01 MED ORDER — LISINOPRIL 20 MG PO TABS: 40.0000 mg | ORAL_TABLET | Freq: Every day | ORAL | Status: DC

## 2013-03-08 ENCOUNTER — Encounter: Payer: Self-pay | Admitting: Internal Medicine

## 2013-03-08 ENCOUNTER — Ambulatory Visit (INDEPENDENT_AMBULATORY_CARE_PROVIDER_SITE_OTHER): Payer: Medicare Other | Admitting: Internal Medicine

## 2013-03-08 ENCOUNTER — Telehealth: Payer: Self-pay

## 2013-03-08 ENCOUNTER — Ambulatory Visit (HOSPITAL_COMMUNITY): Payer: Medicare Other | Attending: Internal Medicine

## 2013-03-08 VITALS — BP 180/110 | HR 55 | Temp 97.9°F | Ht 71.0 in | Wt 255.0 lb

## 2013-03-08 DIAGNOSIS — F17200 Nicotine dependence, unspecified, uncomplicated: Secondary | ICD-10-CM | POA: Insufficient documentation

## 2013-03-08 DIAGNOSIS — M79604 Pain in right leg: Secondary | ICD-10-CM

## 2013-03-08 DIAGNOSIS — M7989 Other specified soft tissue disorders: Secondary | ICD-10-CM

## 2013-03-08 DIAGNOSIS — I10 Essential (primary) hypertension: Secondary | ICD-10-CM

## 2013-03-08 DIAGNOSIS — M79609 Pain in unspecified limb: Secondary | ICD-10-CM

## 2013-03-08 DIAGNOSIS — Z23 Encounter for immunization: Secondary | ICD-10-CM

## 2013-03-08 DIAGNOSIS — M25561 Pain in right knee: Secondary | ICD-10-CM

## 2013-03-08 DIAGNOSIS — M25569 Pain in unspecified knee: Secondary | ICD-10-CM

## 2013-03-08 DIAGNOSIS — E785 Hyperlipidemia, unspecified: Secondary | ICD-10-CM | POA: Insufficient documentation

## 2013-03-08 DIAGNOSIS — I509 Heart failure, unspecified: Secondary | ICD-10-CM

## 2013-03-08 MED ORDER — KETOCONAZOLE 2 % EX CREA: 1.0000 "application " | TOPICAL_CREAM | Freq: Every day | CUTANEOUS | Status: DC

## 2013-03-08 NOTE — Patient Instructions (Addendum)
You had the flu shot today Please continue all other medications as before, and refills have been done if requested. Please have the pharmacy call with any other refills you may need.  You will be contacted regarding the referral for: right leg venous doppler

## 2013-03-08 NOTE — Progress Notes (Signed)
Pre-visit discussion using our clinic review tool. No additional management support is needed unless otherwise documented below in the visit note.  

## 2013-03-08 NOTE — Telephone Encounter (Signed)
The patient is aware of his results from todays test negative for DVT.  He would like to know what to do now.  Also needs a note stating he was seen in the office today, fax to [phone].  Note is on MD's desk to sign.

## 2013-03-08 NOTE — Assessment & Plan Note (Signed)
stable overall by history and exam, recent data reviewed with pt, and pt to continue medical treatment as before,  to f/u any worsening symptoms or concerns BP Readings from Last 3 Encounters:  03/08/13 180/110  01/09/13 154/100  09/14/12 132/82

## 2013-03-08 NOTE — Assessment & Plan Note (Signed)
?   superfic phlebitis vs DVT - for LE venous doppler now, otherwise degree of swelling could be related to knee effusion,  to f/u any worsening symptoms or concerns

## 2013-03-08 NOTE — Telephone Encounter (Signed)
Patient informed of MD instructions.  Faxed letter as requested.

## 2013-03-08 NOTE — Telephone Encounter (Signed)
There is no specific tx needed , can take tylenol for discomfort, may want to f/u with orthopedic in case the swelling to the leg is starting from the knee getting inflamed

## 2013-03-08 NOTE — Assessment & Plan Note (Signed)
No evidence worsening at this time,  to f/u any worsening symptoms or concerns

## 2013-03-08 NOTE — Progress Notes (Signed)
Subjective:    Patient ID: Harold [name], male    DOB: 1965-04-06, 47 y.o.   MRN: [id number]  HPI  Here to f/u with acute, is now s/p 2 arthroscopy surg to right knee in mar 2014 and sept 2014, did well after second until now 1 wk onset warm medial knee only, minor pain but mostly concerned about swelling to RLE below the knee and mild tender firm area to prox post calf. No fever, falls but has a sense of coming giveaway on occasion.  Pt denies chest pain, increased sob or doe, wheezing, orthopnea, PND, increased LE swelling, palpitations, dizziness or syncope.  Pt denies new neurological symptoms such as new headache, or facial or extremity weakness or numbness   Pt denies polydipsia, polyuria,  Past Medical History  Diagnosis Date  . Obesity   . Metabolic syndrome   . Hypertension   . Depression   . GERD (gastroesophageal reflux disease)   . Chronic back pain   . History of pancreatitis     a. admx 04-2009.Marland KitchenMarland Kitchen? 2-2 triglycerides  . Hypertriglyceridemia     a. followed by [facility]  . Acute pericarditis     admx 03-26-10 thru 03-29-10; a. echo 03-26-10: EF 55-60%; mild LVH; trivial MR; RVF ok; mild -mod circumferential Eff w/o tamponade  . Constrictive pericarditis   . OSA (obstructive sleep apnea)   . Gout     pt denies  . Family history of malignant neoplasm of gastrointestinal tract   . Allergic rhinitis, cause unspecified 09/14/2012  . DDD (degenerative disc disease), lumbar 09/14/2012  . Left sided sciatica 09/14/2012  . Erectile dysfunction 09/14/2012   Past Surgical History  Procedure Laterality Date  . Tonsillectomy and adenoidectomy      one tonsil  . Pericardectomy  07/15/2010    Dr. Tyrone Sage  . Pleurx catheter placement  07/15/2010    Dr. Tyrone Sage  . Shoulder surgery      right and left shoulders  . Knee surgery      right    reports that he has been smoking Cigarettes.  He has a 200 pack-year smoking history. He has never used smokeless tobacco. He reports that  he drinks alcohol. He reports that he does not use illicit drugs. family history includes Colon cancer in his father; Diabetes in his father, mother, and sister; Heart disease in his father; Hypertension in his other; Liver cancer in his paternal uncle; Pancreatitis in his father and paternal uncle; Stomach cancer in his maternal grandfather. There is no history of Esophageal cancer or Rectal cancer. Allergies  Allergen Reactions  . Colchicine     REACTION: diarrhea  . Diphenhydramine Hcl     REACTION: itchingm :jittery"  . Other Other (See Comments)    Pecans Reaction: itching and swelling of the tongue    Current Outpatient Prescriptions on File Prior to Visit  Medication Sig Dispense Refill  . acetaminophen (TYLENOL) 500 MG tablet Take 1,000 mg by mouth every 6 (six) hours as needed. For pain      . aspirin 81 MG EC tablet Take 1 tablet (81 mg total) by mouth daily. Swallow whole.  30 tablet  12  . atorvastatin (LIPITOR) 20 MG tablet Take 1 tablet (20 mg total) by mouth daily.  90 tablet  3  . citalopram (CELEXA) 40 MG tablet Take 1 tablet (40 mg total) by mouth daily.  90 tablet  3  . cyclobenzaprine (FLEXERIL) 10 MG tablet Take 10 mg by  mouth at bedtime as needed. For muscle spasms      . diclofenac sodium (VOLTAREN) 1 % GEL Apply 1 application topically 4 (four) times daily.  3 Tube  2  . fenofibrate (TRICOR) 145 MG tablet Take 1 tablet (145 mg total) by mouth daily.  90 tablet  3  . furosemide (LASIX) 20 MG tablet Take 1 tablet (20 mg total) by mouth daily.  90 tablet  3  . HYDROcodone-acetaminophen (NORCO) 7.5-325 MG per tablet Take 1 tablet by mouth every 6 (six) hours as needed for pain. To fill Mar 10, 2013  120 tablet  0  . ibuprofen (ADVIL,MOTRIN) 200 MG tablet Take 800 mg by mouth every 6 (six) hours as needed. For pain      . lisinopril (PRINIVIL,ZESTRIL) 20 MG tablet Take 2 tablets (40 mg total) by mouth daily.  180 tablet  0  . potassium chloride (K-DUR) 10 MEQ tablet Take 1  tablet (10 mEq total) by mouth daily.  90 tablet  3  . [DISCONTINUED] amoxicillin-clarithromycin-lansoprazole (PREVPAC) combo pack Take by mouth 2 (two) times daily. Follow package directions.  1 kit  0   No current facility-administered medications on file prior to visit.   Review of Systems  Constitutional: Negative for unexpected weight change, or unusual diaphoresis  HENT: Negative for tinnitus.   Eyes: Negative for photophobia and visual disturbance.  Respiratory: Negative for choking and stridor.   Gastrointestinal: Negative for vomiting and blood in stool.  Genitourinary: Negative for hematuria and decreased urine volume.  Musculoskeletal: Negative for acute joint swelling Skin: Negative for color change and wound.  Neurological: Negative for tremors and numbness other than noted  Psychiatric/Behavioral: Negative for decreased concentration or  hyperactivity.       Objective:   Physical Exam BP 180/110  Pulse 55  Temp(Src) 97.9 F (36.6 C) (Oral)  Ht 5\' 11"  (1.803 m)  Wt 255 lb (115.667 kg)  BMI 35.58 kg/m2  SpO2 97% VS noted,  Constitutional: Pt appears well-developed and well-nourished.  HENT: Head: NCAT.  Right Ear: External ear normal.  Left Ear: External ear normal.  Eyes: Conjunctivae and EOM are normal. Pupils are equal, round, and reactive to light.  Neck: Normal range of motion. Neck supple.  Cardiovascular: Normal rate and regular rhythm.   Pulmonary/Chest: Effort normal and breath sounds normal.  Abd:  Soft, NT, non-distended, + BS Neurological: Pt is alert. Not confused  Right knee with 1+ effusion, medial knee warmth mild RLE below knee with 1-2+ edema with a firm mild tender slightly raised area subq at the prox calf Psychiatric: Pt behavior is normal. Thought content normal.     Assessment & Plan:

## 2013-03-08 NOTE — Assessment & Plan Note (Signed)
Should f/u with ortho for this as well, tylenol prn

## 2013-03-15 ENCOUNTER — Ambulatory Visit: Payer: Medicare Other | Admitting: Internal Medicine

## 2013-04-05 ENCOUNTER — Telehealth: Payer: Self-pay | Admitting: *Deleted

## 2013-04-05 NOTE — Telephone Encounter (Signed)
Pt phoned stating he needed meds refilled, but did not state which ones.  Attempted to contact pt for clarification, no answer, left message to call us back

## 2013-04-05 NOTE — Telephone Encounter (Signed)
Ok to keep trying - to [facility] for routine refills

## 2013-04-08 ENCOUNTER — Telehealth: Payer: Self-pay | Admitting: *Deleted

## 2013-04-08 MED ORDER — HYDROCODONE-ACETAMINOPHEN 7.5-325 MG PO TABS: 1.0000 | ORAL_TABLET | Freq: Four times a day (QID) | ORAL | Status: DC | PRN

## 2013-04-08 NOTE — Telephone Encounter (Signed)
Last OV 03/08/13.  Phoned requesting refill on his Norco.  Please advise.  CB# [phone]

## 2013-04-08 NOTE — Telephone Encounter (Signed)
Done hardcopy to [facility], to also let pt know  You are given the letter today explaining the transitional pain medication refill policy, due to recent change in [place] Law and [place] regulations  Please be aware that I will no longer be able to offer monthly refills of any Schedule II or higher medication starting Apr 28, 2013  I realize pt may need to consider change of physician due to this, I can to try to refer to pain management but there is no guarantee about this

## 2013-04-09 NOTE — Telephone Encounter (Signed)
Called the patient informed hardcopy's are ready for pickup at the front desk and explained enclosed letter as well.

## 2013-04-09 NOTE — Telephone Encounter (Signed)
Called left message to call back 

## 2013-04-11 ENCOUNTER — Ambulatory Visit: Payer: Medicare Other | Admitting: Internal Medicine

## 2013-04-11 DIAGNOSIS — Z0289 Encounter for other administrative examinations: Secondary | ICD-10-CM

## 2013-07-11 ENCOUNTER — Ambulatory Visit (INDEPENDENT_AMBULATORY_CARE_PROVIDER_SITE_OTHER): Payer: Medicare Other | Admitting: Internal Medicine

## 2013-07-11 ENCOUNTER — Encounter: Payer: Self-pay | Admitting: Internal Medicine

## 2013-07-11 VITALS — BP 180/108 | HR 73 | Temp 98.7°F | Ht 71.0 in | Wt 259.4 lb

## 2013-07-11 DIAGNOSIS — E785 Hyperlipidemia, unspecified: Secondary | ICD-10-CM

## 2013-07-11 DIAGNOSIS — G894 Chronic pain syndrome: Secondary | ICD-10-CM

## 2013-07-11 DIAGNOSIS — J309 Allergic rhinitis, unspecified: Secondary | ICD-10-CM

## 2013-07-11 DIAGNOSIS — I10 Essential (primary) hypertension: Secondary | ICD-10-CM

## 2013-07-11 DIAGNOSIS — Z0000 Encounter for general adult medical examination without abnormal findings: Secondary | ICD-10-CM

## 2013-07-11 HISTORY — DX: Hyperlipidemia, unspecified: E78.5

## 2013-07-11 MED ORDER — HYDROCODONE-ACETAMINOPHEN 7.5-325 MG PO TABS: 1.0000 | ORAL_TABLET | Freq: Four times a day (QID) | ORAL | Status: DC | PRN

## 2013-07-11 MED ORDER — METHYLPREDNISOLONE ACETATE 80 MG/ML IJ SUSP
80.0000 mg | Freq: Once | INTRAMUSCULAR | Status: AC
  Administered 2013-07-11: 80 mg via INTRAMUSCULAR

## 2013-07-11 MED ORDER — PREDNISONE 10 MG PO TABS: ORAL_TABLET | ORAL | Status: DC

## 2013-07-11 MED ORDER — AMLODIPINE BESYLATE 10 MG PO TABS: 10.0000 mg | ORAL_TABLET | Freq: Every day | ORAL | Status: DC

## 2013-07-11 MED ORDER — ATORVASTATIN CALCIUM 20 MG PO TABS: 20.0000 mg | ORAL_TABLET | Freq: Every day | ORAL | Status: DC

## 2013-07-11 NOTE — Progress Notes (Signed)
Subjective:    Patient ID: Harold [name], male    DOB: 1965-06-21, 48 y.o.   MRN: [id number]  HPI   Here with 2-3 wks ongoing nasal allergy symptoms with clearish congestion, itch and sneezing, without fever, pain, ST, cough, swelling or wheezing. Pt denies chest pain, increased sob or doe, wheezing, orthopnea, PND, increased LE swelling, palpitations, dizziness or syncope.  Pt denies new neurological symptoms such as new headache, or facial or extremity weakness or numbness   Pt denies polydipsia, polyuria. Denies worsening reflux, abd pain, dysphagia, n/v, bowel change or blood. Overall pain controlled, Pt continues to have recurring LBP without change in severity, bowel or bladder change, fever, wt loss,  worsening LE pain/numbness/weakness, gait change or falls. Past Medical History  Diagnosis Date  . Obesity   . Metabolic syndrome   . Hypertension   . Depression   . GERD (gastroesophageal reflux disease)   . Chronic back pain   . History of pancreatitis     a. admx 04-2009.Marland KitchenMarland Kitchen? 2-2 triglycerides  . Hypertriglyceridemia     a. followed by [facility]  . Acute pericarditis     admx 03-26-10 thru 03-29-10; a. echo 03-26-10: EF 55-60%; mild LVH; trivial MR; RVF ok; mild -mod circumferential Eff w/o tamponade  . Constrictive pericarditis   . OSA (obstructive sleep apnea)   . Gout     pt denies  . Family history of malignant neoplasm of gastrointestinal tract   . Allergic rhinitis, cause unspecified 09/14/2012  . DDD (degenerative disc disease), lumbar 09/14/2012  . Left sided sciatica 09/14/2012  . Erectile dysfunction 09/14/2012  . Hyperlipidemia 07/11/2013   Past Surgical History  Procedure Laterality Date  . Tonsillectomy and adenoidectomy      one tonsil  . Pericardectomy  07/15/2010    Dr. [name]  . Pleurx catheter placement  07/15/2010    Dr. [name]  . Shoulder surgery      right and left shoulders  . Knee surgery      right    reports that he has been smoking  Cigarettes.  He has a 200 pack-year smoking history. He has never used smokeless tobacco. He reports that he drinks alcohol. He reports that he does not use illicit drugs. family history includes Colon cancer in his father; Diabetes in his father, mother, and sister; Heart disease in his father; Hypertension in his other; Liver cancer in his paternal uncle; Pancreatitis in his father and paternal uncle; Stomach cancer in his maternal grandfather. There is no history of Esophageal cancer or Rectal cancer. Allergies  Allergen Reactions  . Colchicine     REACTION: diarrhea  . Diphenhydramine Hcl     REACTION: itchingm :jittery"  . Other Other (See Comments)    Pecans Reaction: itching and swelling of the tongue    Current Outpatient Prescriptions on File Prior to Visit  Medication Sig Dispense Refill  . acetaminophen (TYLENOL) 500 MG tablet Take 1,000 mg by mouth every 6 (six) hours as needed. For pain      . aspirin 81 MG EC tablet Take 1 tablet (81 mg total) by mouth daily. Swallow whole.  30 tablet  12  . citalopram (CELEXA) 40 MG tablet Take 1 tablet (40 mg total) by mouth daily.  90 tablet  3  . cyclobenzaprine (FLEXERIL) 10 MG tablet Take 10 mg by mouth at bedtime as needed. For muscle spasms      . diclofenac sodium (VOLTAREN) 1 % GEL Apply 1 application  topically 4 (four) times daily.  3 Tube  2  . fenofibrate (TRICOR) 145 MG tablet Take 1 tablet (145 mg total) by mouth daily.  90 tablet  3  . furosemide (LASIX) 20 MG tablet Take 1 tablet (20 mg total) by mouth daily.  90 tablet  3  . ibuprofen (ADVIL,MOTRIN) 200 MG tablet Take 800 mg by mouth every 6 (six) hours as needed. For pain      . ketoconazole (NIZORAL) 2 % cream Apply 1 application topically daily.  30 g  1  . lisinopril (PRINIVIL,ZESTRIL) 20 MG tablet Take 2 tablets (40 mg total) by mouth daily.  180 tablet  0  . potassium chloride (K-DUR) 10 MEQ tablet Take 1 tablet (10 mEq total) by mouth daily.  90 tablet  3  .  [DISCONTINUED] amoxicillin-clarithromycin-lansoprazole (PREVPAC) combo pack Take by mouth 2 (two) times daily. Follow package directions.  1 kit  0   No current facility-administered medications on file prior to visit.   Review of Systems  Constitutional: Negative for unexpected weight change, or unusual diaphoresis  HENT: Negative for tinnitus.   Eyes: Negative for photophobia and visual disturbance.  Respiratory: Negative for choking and stridor.   Gastrointestinal: Negative for vomiting and blood in stool.  Genitourinary: Negative for hematuria and decreased urine volume.  Musculoskeletal: Negative for acute joint swelling Skin: Negative for color change and wound.  Neurological: Negative for tremors and numbness other than noted  Psychiatric/Behavioral: Negative for decreased concentration or  hyperactivity.       Objective:   Physical Exam BP 180/108  Pulse 73  Temp(Src) 98.7 F (37.1 C) (Oral)  Ht '5\' 11"'  (1.803 m)  Wt 259 lb 6 oz (117.652 kg)  BMI 36.19 kg/m2  SpO2 97% VS noted,  Constitutional: Pt appears well-developed and well-nourished.  HENT: Head: NCAT.  Right Ear: External ear normal.  Left Ear: External ear normal.  Bilat tm's with mild erythema.  Max sinus areas non tender.  Pharynx with mild erythema, no exudate Eyes: Conjunctivae and EOM are normal. Pupils are equal, round, and reactive to light.  Neck: Normal range of motion. Neck supple.  Cardiovascular: Normal rate and regular rhythm.   Pulmonary/Chest: Effort normal and breath sounds normal.  Abd:  Soft, NT, non-distended, + BS Neurological: Pt is alert. Not confused  Skin: Skin is warm. No erythema.  Psychiatric: Pt behavior is normal. Thought content normal.     Assessment & Plan:

## 2013-07-11 NOTE — Patient Instructions (Signed)
You had the steroid shot today  Please take all new medication as prescribed - the amlodipine 10 mg per day, and the short course of prednisone  Please continue all other medications as before, and refills have been done if requested - the pain medications  Please have the pharmacy call with any other refills you may need.  Please contact your local pain clinic for ongoing pain management, as I will no longer be able to provide your pain medication prescriptions after today  Please return in 3 months, or sooner if needed, with Lab testing done 3-5 days before

## 2013-07-11 NOTE — Assessment & Plan Note (Signed)
Uncontrolled, to add amlod 10 qd, watch for worsening LE edema

## 2013-07-11 NOTE — Assessment & Plan Note (Signed)
Mild to mod, for depomedrol IM, predpak asd, otc allegra prn, to f/u any worsening symptoms or concerns

## 2013-07-11 NOTE — Progress Notes (Signed)
Pre visit review using our clinic review tool, if applicable. No additional management support is needed unless otherwise documented below in the visit note. 

## 2013-07-11 NOTE — Assessment & Plan Note (Addendum)
Not taking the lipitor, to re-start, f/u labs next visit  Lab Results  Component Value Date   LDLCALC 76 12/14/2010

## 2013-07-11 NOTE — Assessment & Plan Note (Signed)
[place] for refills now x 3 mo, pt plans to f/u with more local pain clinic in [facility]

## 2013-07-24 ENCOUNTER — Telehealth: Payer: Self-pay | Admitting: Internal Medicine

## 2013-07-24 ENCOUNTER — Telehealth: Payer: Self-pay | Admitting: *Deleted

## 2013-07-24 NOTE — Telephone Encounter (Signed)
Pt family called states pt has began having cramps since starting new BP medication.  Please advise

## 2013-07-24 NOTE — Telephone Encounter (Signed)
Hard to say which if any of his medications may be related to this, as his BP medications are not likely the cause, except for possibly the lasix with the fluid and electrolyte shifts, or the lipitor which is also known to do this, as well as overexertion such as standing and walking during the day more than usual  I would encourage stay on all meds for now to see if it will pass, cont the flexeril prn, but if necessary, could consider a 2 wks holiday from the lipitor to see if that helps

## 2013-07-24 NOTE — Telephone Encounter (Signed)
Patient states that he is taking his amLODipine (NORVASC) 10 MG tablet and wants to know if it is normal for him to be experiencing cramps while taking.   He is also asking if he needs to increase his dosage of potassium while taking. Please advise.

## 2013-07-25 NOTE — Telephone Encounter (Signed)
Spoke with pt advised of MDs message 

## 2013-09-04 ENCOUNTER — Encounter: Payer: Self-pay | Admitting: Family Medicine

## 2013-09-04 ENCOUNTER — Other Ambulatory Visit (INDEPENDENT_AMBULATORY_CARE_PROVIDER_SITE_OTHER): Payer: Medicare Other

## 2013-09-04 ENCOUNTER — Ambulatory Visit (INDEPENDENT_AMBULATORY_CARE_PROVIDER_SITE_OTHER): Payer: Medicare Other | Admitting: Family Medicine

## 2013-09-04 VITALS — BP 150/86 | HR 71 | Ht 71.0 in | Wt 258.0 lb

## 2013-09-04 DIAGNOSIS — M25569 Pain in unspecified knee: Secondary | ICD-10-CM

## 2013-09-04 DIAGNOSIS — M25561 Pain in right knee: Secondary | ICD-10-CM

## 2013-09-04 DIAGNOSIS — M1710 Unilateral primary osteoarthritis, unspecified knee: Secondary | ICD-10-CM | POA: Insufficient documentation

## 2013-09-04 MED ORDER — DICLOFENAC SODIUM 2 % TD SOLN: 2.0000 "application " | Freq: Two times a day (BID) | TRANSDERMAL | Status: DC

## 2013-09-04 NOTE — Assessment & Plan Note (Signed)
Patient does have what appears to be a questionable meniscal tear but per patient he has had a meniscectomy. Differential also includes the patient's BioCartilage could have been displaced causing this difficulty. Also patient may just be having underlying osteoarthritis flare. At this time would like to do is put patient in a brace which was fitted by me today. We discussed icing protocol as well as over-the-counter medications that could be beneficial. In addition patient is going to start a home exercise program. I would like patient come back in 2-3 weeks after I did the reports from his surgeries have a more clear detail of what has occurred as well as any imaging. Depending on findings as well as patient's response to conservative therapy and then we will change her management accordingly.

## 2013-09-04 NOTE — Progress Notes (Signed)
[facility] [address], [place] [address] Phone: [phone] Subjective:    I'm seeing this patient by the request  of:  Cathlean Cower, MD   CC: Knee pain, right  [facility]:[id number] Harold [name] is a 48 y.o. male coming in with complaint of knee pain. Patient is a past medical history significant for chronic pain syndrome. Patient is going to a pain clinic. Patient has primary provider previously and did have x-rays. X-rays were reviewed by me today and shows the patient does have a joint effusion greater than a year ago. Patient also had mild to moderate osteophytic changes of all 3 compartments. Patient states that he went to [facility] and and there he did have surgery with orthotics [place] for meniscal tear. Patient states he did not make any improvement in within another 3 months he did have another surgery in September of last year. Patient states that he did have a biocartilage implanted. Patient states since that time it is not seem to be correct. Patient states it still has a locking sensation and is severely tender to palpation at all times. Patient states if he makes any twisting motion he feels like his knee with 40 to get out. Patient is doing topical anti-inflammatory with minimal benefit. Patient does see a pain clinic as well. He's not doing any other home modalities. Patient is a severity of 9/10. We do not have any other records from these surgeries.      Past medical history, social, surgical and family history all reviewed in electronic medical record.   Review of Systems: No headache, visual changes, nausea, vomiting, diarrhea, constipation, dizziness, abdominal pain, skin rash, fevers, chills, night sweats, weight loss, swollen lymph nodes, body aches, joint swelling, muscle aches, chest pain, shortness of breath, mood changes.   Objective Blood pressure 150/86, pulse 71, height 5\' 11"  (1.803 m), weight 258 lb (117.028 kg), SpO2  97.00%.  General: No apparent distress alert and oriented x3 mood and affect normal, dressed appropriately. obese HEENT: Pupils equal, extraocular movements intact  Respiratory: Patient's speak in full sentences and does not appear short of breath  Cardiovascular: No lower extremity edema, non tender, no erythema  Skin: Warm dry intact with no signs of infection or rash on extremities or on axial skeleton.  Abdomen: Soft nontender  Neuro: Cranial nerves II through XII are intact, neurovascularly intact in all extremities with 2+ DTRs and 2+ pulses.  Lymph: No lymphadenopathy of posterior or anterior cervical chain or axillae bilaterally.  Gait normal with good balance and coordination.  MSK:  Non tender with full range of motion and good stability and symmetric strength and tone of shoulders, elbows, wrist, hip, and ankles bilaterally.  Knee: Right On inspection patient does have what appears to be a trace effusion compared to the contralateral side. In addition this patient does have scar from previous open surgery on the medial portion of the knee. Patient is severely tender to even light palpation. Patient does have most of his pain over the medial joint line. Range of motion lacks the last 5 of extension as well as the last 10 of flexion. Ligaments with solid consistent endpoints including ACL, PCL, LCL, MCL. Positive Mcmurray's, Apley's, and Thessalonian tests.  painful patellar compression. Patellar glide with mild crepitus. Patellar and quadriceps tendons unremarkable. Hamstring and quadriceps strength is normal.  Contralateral knee unremarkable.  MSK US performed of: Right knee This study was ordered, performed, and interpreted by Thedore Mins  Othel Hoogendoorn D.O.  Knee: All structures visualized. Patient has a trace effusion of the suprapatellar pouch Anteromedial meniscus is what appears to be present. Patient does have what appears to be a potential posterior removal of the meniscus but still  some debris is noted. Mild to moderate osteoarthritis secondary to narrowing of the joint space is also appreciated. Anterolateral,  and posterolateral menisci unremarkable without tearing, fraying, effusion, or displacement. Patellar Tendon unremarkable on long and transverse views without effusion. No abnormality of prepatellar bursa. LCL and MCL unremarkable on long and transverse views. No abnormality of origin of medial or lateral head of the gastrocnemius.  IMPRESSION:  Questionable tear versus postsurgical changes of the medial meniscus with small joint effusion..     Impression and Recommendations:     This case required medical decision making of moderate complexity.

## 2013-09-04 NOTE — Patient Instructions (Signed)
Great to meet you Ice 20 minutes 2 times a day Exercises 3 times a week Wear brace with activity  Vitamin D 2000 Iu daily Turmeric 500mg  twice daily.  We will get your records.  Come back in 2-3 weeks and we will see what you are doing and I will have more info.

## 2013-09-09 ENCOUNTER — Encounter: Payer: Self-pay | Admitting: Family Medicine

## 2013-09-09 DIAGNOSIS — M1710 Unilateral primary osteoarthritis, unspecified knee: Secondary | ICD-10-CM

## 2013-09-18 ENCOUNTER — Ambulatory Visit: Payer: Medicare Other | Admitting: Family Medicine

## 2013-09-24 ENCOUNTER — Other Ambulatory Visit (INDEPENDENT_AMBULATORY_CARE_PROVIDER_SITE_OTHER): Payer: Medicare Other

## 2013-09-24 ENCOUNTER — Ambulatory Visit (INDEPENDENT_AMBULATORY_CARE_PROVIDER_SITE_OTHER): Payer: Medicare Other | Admitting: Family Medicine

## 2013-09-24 ENCOUNTER — Encounter: Payer: Self-pay | Admitting: Family Medicine

## 2013-09-24 VITALS — BP 132/80 | HR 74 | Ht 71.0 in | Wt 259.0 lb

## 2013-09-24 DIAGNOSIS — M25569 Pain in unspecified knee: Secondary | ICD-10-CM

## 2013-09-24 DIAGNOSIS — M25561 Pain in right knee: Secondary | ICD-10-CM

## 2013-09-24 DIAGNOSIS — M5136 Other intervertebral disc degeneration, lumbar region: Secondary | ICD-10-CM

## 2013-09-24 DIAGNOSIS — M1711 Unilateral primary osteoarthritis, right knee: Secondary | ICD-10-CM

## 2013-09-24 DIAGNOSIS — M5137 Other intervertebral disc degeneration, lumbosacral region: Secondary | ICD-10-CM

## 2013-09-24 DIAGNOSIS — M1710 Unilateral primary osteoarthritis, unspecified knee: Secondary | ICD-10-CM

## 2013-09-24 MED ORDER — DICLOFENAC SODIUM 2 % TD SOLN: TRANSDERMAL | Status: DC

## 2013-09-24 NOTE — Assessment & Plan Note (Signed)
Patient did have what appeared to be a very large calcific change to the medial aspect on the superior rim of the medial knee. Under ultrasound this was injected today and it appeared that this piece did break off. This could have been potentially some mild cartilage. Patient did have significant decrease in pain immediately as well as better range of motion of the knee. This is making is optimistic. Patient will continue with the bracing showed proper wearing them. We discussed topical anti-inflammatories and [place] this can be beneficial. Patient was prescribed a set last visit but never got it so we are prescribed it again. The discussed with patient that he needs to be doing the exercises fairly regularly. Patient will come back and see [place] again in 3-4 weeks for further evaluation and treatment.  Patient did ask about narcotics and we discussed once again that he would not do narcotics in our clinic.

## 2013-09-24 NOTE — Assessment & Plan Note (Signed)
Patient's last x-rays were in 2010. Patient states that his pain. Patient come back again for further evaluation greater detail

## 2013-09-24 NOTE — Progress Notes (Signed)
[facility] [address], [place] [address] Phone: [phone] Subjective:     CC: Knee pain, right followup  UXL:[id number] Harold [name] is a 48 y.o. male coming in with complaint of knee pain. Patient is a past medical history significant for chronic pain syndrome. Patient was seen previously and was given a brace. Patient states that the brace is helping approximately 5%. We also tried a topical anti-inflammatory which patient states it is never received. Patient continues to unfortunately take his pain medications. Patient denies any new symptoms but states that he was unable to enjoy his speech trip secondary to pain.  PMHx Patient is going to a pain clinic. Patient has primary provider previously and did have x-rays. X-rays were reviewed by me today and shows the patient does have a joint effusion greater than a year ago. Patient also had mild to moderate osteophytic changes of all 3 compartments. Patient states that he went to [facility] and and there he did have surgery with ortho [place] for meniscal tear. Patient states he did not make any improvement in within another 3 months he did have another surgery in September of last year. Patient states that he did have a biocartilage implanted.       Past medical history, social, surgical and family history all reviewed in electronic medical record.   Review of Systems: No headache, visual changes, nausea, vomiting, diarrhea, constipation, dizziness, abdominal pain, skin rash, fevers, chills, night sweats, weight loss, swollen lymph nodes, body aches, joint swelling, muscle aches, chest pain, shortness of breath, mood changes.   Objective Blood pressure 132/80, pulse 74, height 5\' 11"  (1.803 m), weight 259 lb (117.482 kg), SpO2 97.00%.  General: No apparent distress alert and oriented x3 mood and affect normal, dressed appropriately. obese HEENT: Pupils equal, extraocular movements intact    Respiratory: Patient's speak in full sentences and does not appear short of breath  Cardiovascular: No lower extremity edema, non tender, no erythema  Skin: Warm dry intact with no signs of infection or rash on extremities or on axial skeleton.  Abdomen: Soft nontender  Neuro: Cranial nerves II through XII are intact, neurovascularly intact in all extremities with 2+ DTRs and 2+ pulses.  Lymph: No lymphadenopathy of posterior or anterior cervical chain or axillae bilaterally.  Gait normal with good balance and coordination.  MSK:  Non tender with full range of motion and good stability and symmetric strength and tone of shoulders, elbows, wrist, hip, and ankles bilaterally.  Knee: Right On inspection patient does have what appears to be a trace effusion compared to the contralateral side. In addition this patient does have scar from previous open surgery on the medial portion of the knee. Patient is severely tender to even light palpation. Patient does have most of his pain over the medial joint line. Range of motion lacks the last 5 of extension as well as the last 10 of flexion. Ligaments with solid consistent endpoints including ACL, PCL, LCL, MCL. Positive Mcmurray's, Apley's, and Thessalonian tests.  painful patellar compression. Patellar glide with mild crepitus. Patellar and quadriceps tendons unremarkable. Hamstring and quadriceps strength is normal.  Contralateral knee unremarkable.  MSK US performed of: Right knee This study was ordered, performed, and interpreted by Charlann Boxer D.O.  Knee: All structures visualized. Patient has a trace effusion of the suprapatellar pouch Anteromedial meniscus is what appears to be present. Patient does have what appears to be a potential posterior removal of the  meniscus but still some debris is noted. Mild to moderate osteoarthritis secondary to narrowing of the joint space is also appreciated. There is a very large hyperechoic area on the  lateral aspect that does have a different density within the bone itself. This is severely tender to palpation during the exam.  Anterolateral,  and posterolateral menisci unremarkable without tearing, fraying, effusion, or displacement. Patellar Tendon unremarkable on long and transverse views without effusion. No abnormality of prepatellar bursa. LCL and MCL unremarkable on long and transverse views. No abnormality of origin of medial or lateral head of the gastrocnemius.  IMPRESSION:  Questionable foreign body in versus osteophyte giving pain on the medial aspect of the knee.  Procedure: Real-time Ultrasound Guided Injection of right knee Device: GE Logiq E  Ultrasound guided injection is preferred based studies that show increased duration, increased effect, greater accuracy, decreased procedural pain, increased response rate, and decreased cost with ultrasound guided versus blind injection.  Verbal informed consent obtained.  Time-out conducted.  Noted no overlying erythema, induration, or other signs of local infection.  Skin prepped in a sterile fashion.  Local anesthesia: Topical Ethyl chloride.  With sterile technique and under real time ultrasound guidance: With a 22-gauge 2 inch needle patient was injected with 4 cc of 0.5% Marcaine and 1 cc of Kenalog 40 mg/dL. this was done from a anterior medial approach. We decided to do this under ultrasound into the area of this osteophyte formation. When this was hit with the needle and did seem to break off. This did not have the same consistency of bone.  Completed without difficulty  Pain immediately resolved suggesting accurate placement of the medication.  Advised to call if fevers/chills, erythema, induration, drainage, or persistent bleeding.  Images permanently stored and available for review in the ultrasound unit.  Impression: Technically successful ultrasound guided injection. .     Impression and Recommendations:     This  case required medical decision making of moderate complexity.

## 2013-09-24 NOTE — Patient Instructions (Signed)
Good to see you Ice 20 minutes 2 times daily.  Consider gabapentin Continue the brace with activity.  See you in 2-3 week sto check out your back more.

## 2013-09-27 ENCOUNTER — Other Ambulatory Visit: Payer: Self-pay | Admitting: Internal Medicine

## 2013-10-03 ENCOUNTER — Telehealth: Payer: Self-pay

## 2013-10-03 NOTE — Telephone Encounter (Signed)
PA Pennsaid

## 2013-10-04 ENCOUNTER — Ambulatory Visit (INDEPENDENT_AMBULATORY_CARE_PROVIDER_SITE_OTHER): Payer: Medicare Other | Admitting: Internal Medicine

## 2013-10-04 ENCOUNTER — Encounter: Payer: Self-pay | Admitting: Internal Medicine

## 2013-10-04 VITALS — BP 118/72 | HR 73 | Temp 98.4°F | Wt 259.2 lb

## 2013-10-04 DIAGNOSIS — G894 Chronic pain syndrome: Secondary | ICD-10-CM

## 2013-10-04 DIAGNOSIS — F3289 Other specified depressive episodes: Secondary | ICD-10-CM

## 2013-10-04 DIAGNOSIS — I10 Essential (primary) hypertension: Secondary | ICD-10-CM

## 2013-10-04 DIAGNOSIS — E785 Hyperlipidemia, unspecified: Secondary | ICD-10-CM

## 2013-10-04 DIAGNOSIS — F329 Major depressive disorder, single episode, unspecified: Secondary | ICD-10-CM

## 2013-10-04 MED ORDER — HYDROCODONE-ACETAMINOPHEN 7.5-325 MG PO TABS: 1.0000 | ORAL_TABLET | Freq: Four times a day (QID) | ORAL | Status: DC | PRN

## 2013-10-04 MED ORDER — ROSUVASTATIN CALCIUM 20 MG PO TABS: 20.0000 mg | ORAL_TABLET | Freq: Every day | ORAL | Status: DC

## 2013-10-04 MED ORDER — KETOCONAZOLE 2 % EX CREA: 1.0000 "application " | TOPICAL_CREAM | Freq: Every day | CUTANEOUS | Status: DC

## 2013-10-04 NOTE — Progress Notes (Signed)
Pre visit review using our clinic review tool, if applicable. No additional management support is needed unless otherwise documented below in the visit note. 

## 2013-10-04 NOTE — Progress Notes (Signed)
Subjective:    Patient ID: Harold [name], male    DOB: [birth year], 48 y.o.   MRN: [id number]  HPI  Here to f/u; overall doing ok,  Pt denies chest pain, increased sob or doe, wheezing, orthopnea, PND, increased LE swelling, palpitations, dizziness or syncope.  Pt denies polydipsia, polyuria, or low sugar symptoms such as weakness or confusion improved with po intake.  Pt denies new neurological symptoms such as new headache, or facial or extremity weakness or numbness.   Pt states overall good compliance with meds, has been trying to follow lower cholesterol diet, with wt overall stable,  but little exercise, due to ongoing right knee pain, but which is now improved after tx per Dr Tamala Julian.  Has been intolerant of lipitor in past, wants to go back to crestor although more expensive..  ED has been improved off the beta blocker in the past as well.   Has pain clinic appt aug 17, asks for bridging non escalating dose pain control.  Denies worsening depressive symptoms, suicidal ideation, or panic; Past Medical History  Diagnosis Date  . Obesity   . Metabolic syndrome   . Hypertension   . Depression   . GERD (gastroesophageal reflux disease)   . Chronic back pain   . History of pancreatitis     a. admx 04-2009.Marland KitchenMarland Kitchen? 2-2 triglycerides  . Hypertriglyceridemia     a. followed by [facility]  . Acute pericarditis     admx 03-26-10 thru 03-29-10; a. echo 03-26-10: EF 55-60%; mild LVH; trivial MR; RVF ok; mild -mod circumferential Eff w/o tamponade  . Constrictive pericarditis   . OSA (obstructive sleep apnea)   . Gout     pt denies  . Family history of malignant neoplasm of gastrointestinal tract   . Allergic rhinitis, cause unspecified 09/14/2012  . DDD (degenerative disc disease), lumbar 09/14/2012  . Left sided sciatica 09/14/2012  . Erectile dysfunction 09/14/2012  . Hyperlipidemia 07/11/2013   Past Surgical History  Procedure Laterality Date  . Tonsillectomy and adenoidectomy      one tonsil   . Pericardectomy  07/15/2010    Dr. [name]  . Pleurx catheter placement  07/15/2010    Dr. [name]  . Shoulder surgery      right and left shoulders  . Knee surgery      right    reports that he has been smoking Cigarettes.  He has a 200 pack-year smoking history. He has never used smokeless tobacco. He reports that he drinks alcohol. He reports that he does not use illicit drugs. family history includes Colon cancer in his father; Diabetes in his father, mother, and sister; Heart disease in his father; Hypertension in his other; Liver cancer in his paternal uncle; Pancreatitis in his father and paternal uncle; Stomach cancer in his maternal grandfather. There is no history of Esophageal cancer or Rectal cancer. Allergies  Allergen Reactions  . Colchicine     REACTION: diarrhea  . Diphenhydramine Hcl     REACTION: itchingm :jittery"  . Lipitor [Atorvastatin]     Muscle cramps  . Other Other (See Comments)    Pecans Reaction: itching and swelling of the tongue    Current Outpatient Prescriptions on File Prior to Visit  Medication Sig Dispense Refill  . amLODipine (NORVASC) 10 MG tablet Take 1 tablet (10 mg total) by mouth daily.  90 tablet  3  . aspirin 81 MG EC tablet Take 1 tablet (81 mg total) by mouth daily. Swallow  whole.  30 tablet  12  . atorvastatin (LIPITOR) 20 MG tablet Take 1 tablet (20 mg total) by mouth daily.  90 tablet  3  . citalopram (CELEXA) 40 MG tablet TAKE 1 TABLET BY MOUTH DAILY  90 tablet  3  . cyclobenzaprine (FLEXERIL) 10 MG tablet Take 10 mg by mouth at bedtime as needed. For muscle spasms      . Diclofenac Sodium (PENNSAID) 2 % SOLN Place 2 application onto the skin 2 (two) times daily.  112 g  3  . fenofibrate (TRICOR) 145 MG tablet Take 1 tablet (145 mg total) by mouth daily.  90 tablet  3  . furosemide (LASIX) 20 MG tablet Take 1 tablet (20 mg total) by mouth daily.  90 tablet  3  . lisinopril (PRINIVIL,ZESTRIL) 20 MG tablet Take 2 tablets (40 mg total)  by mouth daily.  180 tablet  0  . potassium chloride (K-DUR) 10 MEQ tablet Take 1 tablet (10 mEq total) by mouth daily.  90 tablet  3  . Diclofenac Sodium 2 % SOLN Apply twice daily  112 g  1  . [DISCONTINUED] amoxicillin-clarithromycin-lansoprazole (PREVPAC) combo pack Take by mouth 2 (two) times daily. Follow package directions.  1 kit  0   No current facility-administered medications on file prior to visit.     Review of Systems  Constitutional: Negative for unusual diaphoresis or other sweats  HENT: Negative for ringing in ear Eyes: Negative for double vision or worsening visual disturbance.  Respiratory: Negative for choking and stridor.   Gastrointestinal: Negative for vomiting or other signifcant bowel change Genitourinary: Negative for hematuria or decreased urine volume.  Musculoskeletal: Negative for other MSK pain or swelling Skin: Negative for color change and worsening wound.  Neurological: Negative for tremors and numbness other than noted  Psychiatric/Behavioral: Negative for decreased concentration or agitation other than above       Objective:   Physical Exam BP 118/72  Pulse 73  Temp(Src) 98.4 F (36.9 C) (Oral)  Wt 259 lb 4 oz (117.595 kg)  SpO2 97% VS noted,  Constitutional: Pt appears well-developed, well-nourished.  HENT: Head: NCAT.  Right Ear: External ear normal.  Left Ear: External ear normal.  Eyes: . Pupils are equal, round, and reactive to light. Conjunctivae and EOM are normal Neck: Normal range of motion. Neck supple.  Cardiovascular: Normal rate and regular rhythm.   Pulmonary/Chest: Effort normal and breath sounds normal.  Abd:  Soft, NT, ND, + BS Neurological: Pt is alert. Not confused , motor grossly intact Skin: Skin is warm. No rash Psychiatric: Pt behavior is normal. No agitation.     Assessment & Plan:

## 2013-10-04 NOTE — Patient Instructions (Addendum)
OK to stop the lipitor as you have  Please take all new medication as prescribed - the crestor  Please continue all other medications as before, and refills have been done if requested - the hydrocodone  Please have the pharmacy call with any other refills you may need.  Please keep your appointments with your specialists as you may have planned, including the pain clinic aug 17 as planned  Please return in 3 months, or sooner if needed

## 2013-10-05 NOTE — Assessment & Plan Note (Signed)
[place] for 2 mo refill pain med, f/u with pain clinic as planned

## 2013-10-05 NOTE — Assessment & Plan Note (Signed)
Ok to re-start crestor, f/u labs next visit

## 2013-10-05 NOTE — Assessment & Plan Note (Signed)
stable overall by history and exam, recent data reviewed with pt, and pt to continue medical treatment as before,  to f/u any worsening symptoms or concerns BP Readings from Last 3 Encounters:  10/04/13 118/72  09/24/13 132/80  09/04/13 150/86

## 2013-10-05 NOTE — Assessment & Plan Note (Signed)
stable overall by history and exam, recent data reviewed with pt, and pt to continue medical treatment as before,  to f/u any worsening symptoms or concerns Lab Results  Component Value Date   WBC 10.0 09/14/2012   HGB 15.6 09/14/2012   HCT 47.0 09/14/2012   PLT 304.0 09/14/2012   GLUCOSE 125* 09/14/2012   CHOL 207* 09/14/2012   TRIG 286.0* 09/14/2012   HDL 32.30* 09/14/2012   LDLDIRECT 124.5 09/14/2012   LDLCALC 76 12/14/2010   ALT 18 09/14/2012   AST 24 09/14/2012   NA 136 09/14/2012   K 4.4 09/14/2012   CL 102 09/14/2012   CREATININE 1.0 09/14/2012   BUN 13 09/14/2012   CO2 25 09/14/2012   TSH 0.63 09/14/2012   PSA 1.25 09/14/2012   INR 1.72* 07/05/2010   HGBA1C 6.3 04/12/2011   MICROALBUR 1.6 04/12/2011

## 2013-10-07 ENCOUNTER — Other Ambulatory Visit: Payer: Self-pay | Admitting: Internal Medicine

## 2013-10-15 ENCOUNTER — Ambulatory Visit: Payer: Medicare Other | Admitting: Family Medicine

## 2013-10-16 ENCOUNTER — Other Ambulatory Visit: Payer: Self-pay | Admitting: Internal Medicine

## 2013-10-16 ENCOUNTER — Telehealth: Payer: Self-pay | Admitting: Family Medicine

## 2013-10-16 NOTE — Telephone Encounter (Signed)
Noted  

## 2013-10-16 NOTE — Telephone Encounter (Signed)
Patient no showed for two week follow up on 10/15/2013.  Please advise.

## 2013-10-23 ENCOUNTER — Telehealth: Payer: Self-pay | Admitting: Internal Medicine

## 2013-10-23 MED ORDER — ZOLPIDEM TARTRATE 10 MG PO TABS: 10.0000 mg | ORAL_TABLET | Freq: Every evening | ORAL | Status: DC | PRN

## 2013-10-23 NOTE — Telephone Encounter (Signed)
Patient called and states that his fiance' just recently passed away and he is needing something to help him sleep/cope. Please advise.

## 2013-10-23 NOTE — Telephone Encounter (Signed)
ambien Done hardcopy to [facility]

## 2013-10-24 NOTE — Telephone Encounter (Signed)
Faxed hardcopy to [facility].

## 2013-10-24 NOTE — Telephone Encounter (Signed)
Called the patient left message script sent in to [facility].

## 2013-11-19 ENCOUNTER — Ambulatory Visit (INDEPENDENT_AMBULATORY_CARE_PROVIDER_SITE_OTHER): Payer: Medicare Other | Admitting: Internal Medicine

## 2013-11-19 ENCOUNTER — Encounter: Payer: Self-pay | Admitting: Internal Medicine

## 2013-11-19 ENCOUNTER — Other Ambulatory Visit (INDEPENDENT_AMBULATORY_CARE_PROVIDER_SITE_OTHER): Payer: Medicare Other

## 2013-11-19 VITALS — BP 128/80 | HR 73 | Temp 98.2°F | Ht 71.0 in | Wt 252.0 lb

## 2013-11-19 DIAGNOSIS — Z0000 Encounter for general adult medical examination without abnormal findings: Secondary | ICD-10-CM

## 2013-11-19 DIAGNOSIS — I10 Essential (primary) hypertension: Secondary | ICD-10-CM

## 2013-11-19 DIAGNOSIS — F4321 Adjustment disorder with depressed mood: Secondary | ICD-10-CM

## 2013-11-19 DIAGNOSIS — R7309 Other abnormal glucose: Secondary | ICD-10-CM

## 2013-11-19 DIAGNOSIS — M549 Dorsalgia, unspecified: Secondary | ICD-10-CM

## 2013-11-19 DIAGNOSIS — R7302 Impaired glucose tolerance (oral): Secondary | ICD-10-CM

## 2013-11-19 DIAGNOSIS — G8929 Other chronic pain: Secondary | ICD-10-CM

## 2013-11-19 DIAGNOSIS — J309 Allergic rhinitis, unspecified: Secondary | ICD-10-CM

## 2013-11-19 DIAGNOSIS — F3289 Other specified depressive episodes: Secondary | ICD-10-CM

## 2013-11-19 DIAGNOSIS — F4320 Adjustment disorder, unspecified: Secondary | ICD-10-CM | POA: Insufficient documentation

## 2013-11-19 DIAGNOSIS — F329 Major depressive disorder, single episode, unspecified: Secondary | ICD-10-CM

## 2013-11-19 DIAGNOSIS — Z23 Encounter for immunization: Secondary | ICD-10-CM

## 2013-11-19 LAB — BASIC METABOLIC PANEL
BUN: 12 mg/dL (ref 6–23)
CO2: 27 mEq/L (ref 19–32)
Calcium: 9.7 mg/dL (ref 8.4–10.5)
Chloride: 103 mEq/L (ref 96–112)
Creatinine, Ser: 0.9 mg/dL (ref 0.4–1.5)
GFR: 118.18 mL/min (ref >60.00)
Glucose, Bld: 112 mg/dL — ABNORMAL HIGH (ref 70–99)
Potassium: 4.1 mEq/L (ref 3.5–5.1)
Sodium: 138 mEq/L (ref 135–145)

## 2013-11-19 LAB — LIPID PANEL
Cholesterol: 146 mg/dL (ref 0–200)
HDL: 37.8 mg/dL — ABNORMAL LOW (ref >39.00)
LDL Cholesterol: 79 mg/dL (ref 0–99)
NonHDL: 108.2
Total CHOL/HDL Ratio: 4
Triglycerides: 145 mg/dL (ref 0.0–149.0)
VLDL: 29 mg/dL (ref 0.0–40.0)

## 2013-11-19 LAB — HEPATIC FUNCTION PANEL
ALT: 17 U/L (ref 0–53)
AST: 23 U/L (ref 0–37)
Albumin: 4.3 g/dL (ref 3.5–5.2)
Alkaline Phosphatase: 68 U/L (ref 39–117)
Bilirubin, Direct: 0.1 mg/dL (ref 0.0–0.3)
Total Bilirubin: 0.5 mg/dL (ref 0.2–1.2)
Total Protein: 7.4 g/dL (ref 6.0–8.3)

## 2013-11-19 LAB — CBC WITH DIFFERENTIAL/PLATELET
Basophils Absolute: 0.1 10*3/uL (ref 0.0–0.1)
Basophils Relative: 0.5 % (ref 0.0–3.0)
Eosinophils Absolute: 0.2 10*3/uL (ref 0.0–0.7)
Eosinophils Relative: 1.4 % (ref 0.0–5.0)
HCT: 44.7 % (ref 39.0–52.0)
Hemoglobin: 15 g/dL (ref 13.0–17.0)
Lymphocytes Relative: 15.4 % (ref 12.0–46.0)
Lymphs Abs: 2 10*3/uL (ref 0.7–4.0)
MCHC: 33.5 g/dL (ref 30.0–36.0)
MCV: 96.2 fl (ref 78.0–100.0)
Monocytes Absolute: 0.9 10*3/uL (ref 0.1–1.0)
Monocytes Relative: 7 % (ref 3.0–12.0)
Neutro Abs: 9.9 10*3/uL — ABNORMAL HIGH (ref 1.4–7.7)
Neutrophils Relative %: 75.7 % (ref 43.0–77.0)
Platelets: 397 10*3/uL (ref 150.0–400.0)
RBC: 4.65 Mil/uL (ref 4.22–5.81)
RDW: 12.9 % (ref 11.5–15.5)
WBC: 13 10*3/uL — ABNORMAL HIGH (ref 4.0–10.5)

## 2013-11-19 LAB — URINALYSIS, ROUTINE W REFLEX MICROSCOPIC
Bilirubin Urine: NEGATIVE
Hgb urine dipstick: NEGATIVE
Ketones, ur: NEGATIVE
Leukocytes, UA: NEGATIVE
Nitrite: NEGATIVE
RBC / HPF: NONE SEEN (ref 0–?)
Specific Gravity, Urine: 1.015 (ref 1.000–1.030)
Total Protein, Urine: NEGATIVE
Urine Glucose: NEGATIVE
Urobilinogen, UA: 0.2 (ref 0.0–1.0)
pH: 6.5 (ref 5.0–8.0)

## 2013-11-19 LAB — PSA: PSA: 1.33 ng/mL (ref 0.10–4.00)

## 2013-11-19 LAB — HEMOGLOBIN A1C: Hgb A1c MFr Bld: 6.5 % (ref 4.6–6.5)

## 2013-11-19 LAB — TSH: TSH: 0.34 u[IU]/mL — ABNORMAL LOW (ref 0.35–4.50)

## 2013-11-19 MED ORDER — HYDROCODONE-ACETAMINOPHEN 7.5-325 MG PO TABS: 1.0000 | ORAL_TABLET | Freq: Four times a day (QID) | ORAL | Status: DC | PRN

## 2013-11-19 MED ORDER — METHYLPREDNISOLONE ACETATE 80 MG/ML IJ SUSP
80.0000 mg | Freq: Once | INTRAMUSCULAR | Status: AC
  Administered 2013-11-19: 80 mg via INTRAMUSCULAR

## 2013-11-19 MED ORDER — CLONAZEPAM 0.5 MG PO TABS: 0.5000 mg | ORAL_TABLET | Freq: Two times a day (BID) | ORAL | Status: DC | PRN

## 2013-11-19 MED ORDER — CYCLOBENZAPRINE HCL 10 MG PO TABS
ORAL_TABLET | ORAL | Status: DC
Start: 2013-11-19 — End: 2014-04-10

## 2013-11-19 NOTE — Addendum Note (Signed)
Addended by: Biagio Borg on: 11/19/2013 11:57 AM   Modules accepted: Orders

## 2013-11-19 NOTE — Patient Instructions (Addendum)
You had the flu shot today, and the steroid shot for allergies  You are given one further prescription for the pain medication, to fill sept 11, 2015  Please call to re-schedul the Pain Clinic first appointment  You will be contacted regarding the referral for: Counseling  Please take all new medication as prescribed - the flexeril changed to three times per day as needed for muscle pain  Please continue all other medications as before, and refills have been done if requested.  Please have the pharmacy call with any other refills you may need.  Please continue your efforts at being more active, low cholesterol diet, and weight control.  You are otherwise up to date with prevention measures today.  Please keep your appointments with your specialists as you may have planned  Please go to the LAB in the Basement (turn left off the elevator) for the tests to be done today  You will be contacted by phone if any changes need to be made immediately.  Otherwise, you will receive a letter about your results with an explanation, but please check with MyChart first.  Please return in 6 months, or sooner if needed

## 2013-11-19 NOTE — Progress Notes (Signed)
Subjective:    Patient ID: Harold [name], male    DOB: 1965/05/11, 48 y.o.   MRN: [id number]  HPI  Here for wellness and f/u;  Overall doing ok;  Pt denies CP, worsening SOB, DOE, wheezing, orthopnea, PND, worsening LE edema, palpitations, dizziness or syncope.  Pt denies neurological change such as new headache, facial or extremity weakness.  Pt denies polydipsia, polyuria, or low sugar symptoms. Pt states overall good compliance with treatment and medications, good tolerability, and has been trying to follow lower cholesterol diet.  Pt denies worsening depressive symptoms, suicidal ideation or panic. No fever, night sweats, wt loss, loss of appetite, or other constitutional symptoms.  Pt states good ability with ADL's, has low fall risk, home safety reviewed and adequate, no other significant changes in hearing or vision.  Does have several wks ongoing nasal allergy symptoms with clearish congestion, itch and sneezing, without fever, pain, ST, cough, swelling or wheezing.  Pt also c/o new right LBP, sore to touch, worse to stand up or bending, but also sometime better with passing gas and/or urination. No other bowel or bladder change, fever, wt loss,  worsening LE pain/numbness/weakness, gait change or falls. Denies constipation, on chronic pain me, also on crestor.  States fiancee died 10/19/2022 with ruptured brain aneurysm.  Did not keep recent first pain clinic appt (missed it), pt called them, states they are to call him with new next appt. Asks for bridge med until then. Still hsa 2 children living with him, all moved back to mothers house for help with children (4 and 80yo) Past Medical History  Diagnosis Date  . Obesity   . Metabolic syndrome   . Hypertension   . Depression   . GERD (gastroesophageal reflux disease)   . Chronic back pain   . History of pancreatitis     a. admx 04-2009.Marland KitchenMarland Kitchen? 2-2 triglycerides  . Hypertriglyceridemia     a. followed by [facility]  . Acute pericarditis       admx 03-26-10 thru 03-29-10; a. echo 03-26-10: EF 55-60%; mild LVH; trivial MR; RVF ok; mild -mod circumferential Eff w/o tamponade  . Constrictive pericarditis   . OSA (obstructive sleep apnea)   . Gout     pt denies  . Family history of malignant neoplasm of gastrointestinal tract   . Allergic rhinitis, cause unspecified 09/14/2012  . DDD (degenerative disc disease), lumbar 09/14/2012  . Left sided sciatica 09/14/2012  . Erectile dysfunction 09/14/2012  . Hyperlipidemia 07/11/2013   Past Surgical History  Procedure Laterality Date  . Tonsillectomy and adenoidectomy      one tonsil  . Pericardectomy  07/15/2010    Dr. [name]  . Pleurx catheter placement  07/15/2010    Dr. [name]  . Shoulder surgery      right and left shoulders  . Knee surgery      right    reports that he has been smoking Cigarettes.  He has a 200 pack-year smoking history. He has never used smokeless tobacco. He reports that he drinks alcohol. He reports that he does not use illicit drugs. family history includes Colon cancer in his father; Diabetes in his father, mother, and sister; Heart disease in his father; Hypertension in his other; Liver cancer in his paternal uncle; Pancreatitis in his father and paternal uncle; Stomach cancer in his maternal grandfather. There is no history of Esophageal cancer or Rectal cancer. Allergies  Allergen Reactions  . Colchicine     REACTION:  diarrhea  . Diphenhydramine Hcl     REACTION: itchingm :jittery"  . Lipitor [Atorvastatin]     Muscle cramps  . Other Other (See Comments)    Pecans Reaction: itching and swelling of the tongue    Current Outpatient Prescriptions on File Prior to Visit  Medication Sig Dispense Refill  . amLODipine (NORVASC) 10 MG tablet Take 1 tablet (10 mg total) by mouth daily.  90 tablet  3  . aspirin 81 MG EC tablet Take 1 tablet (81 mg total) by mouth daily. Swallow whole.  30 tablet  12  . atorvastatin (LIPITOR) 20 MG tablet Take 1 tablet (20  mg total) by mouth daily.  90 tablet  3  . citalopram (CELEXA) 40 MG tablet TAKE 1 TABLET BY MOUTH DAILY  90 tablet  3  . cyclobenzaprine (FLEXERIL) 10 MG tablet Take 10 mg by mouth at bedtime as needed. For muscle spasms      . Diclofenac Sodium (PENNSAID) 2 % SOLN Place 2 application onto the skin 2 (two) times daily.  112 g  3  . Diclofenac Sodium 2 % SOLN Apply twice daily  112 g  1  . fenofibrate (TRICOR) 145 MG tablet TAKE ONE TABLET BY MOUTH DAILY  90 tablet  3  . furosemide (LASIX) 20 MG tablet TAKE ONE TABLET BY MOUTH DAILY  90 tablet  3  . HYDROcodone-acetaminophen (NORCO) 7.5-325 MG per tablet Take 1 tablet by mouth every 6 (six) hours as needed. To fill Nov 06, 2013  120 tablet  0  . ketoconazole (NIZORAL) 2 % cream Apply 1 application topically daily.  30 g  1  . lisinopril (PRINIVIL,ZESTRIL) 20 MG tablet Take 2 tablets (40 mg total) by mouth daily.  180 tablet  0  . potassium chloride (K-DUR) 10 MEQ tablet TAKE ONE TABLET BY MOUTH DAILY  90 tablet  3  . rosuvastatin (CRESTOR) 20 MG tablet Take 1 tablet (20 mg total) by mouth daily.  90 tablet  3  . zolpidem (AMBIEN) 10 MG tablet Take 1 tablet (10 mg total) by mouth at bedtime as needed for sleep.  30 tablet  1  . [DISCONTINUED] amoxicillin-clarithromycin-lansoprazole (PREVPAC) combo pack Take by mouth 2 (two) times daily. Follow package directions.  1 kit  0   No current facility-administered medications on file prior to visit.     Review of Systems Constitutional: Negative for increased diaphoresis, other activity, appetite or other siginficant weight change  HENT: Negative for worsening hearing loss, ear pain, facial swelling, mouth sores and neck stiffness.   Eyes: Negative for other worsening pain, redness or visual disturbance.  Respiratory: Negative for shortness of breath and wheezing.   Cardiovascular: Negative for chest pain and palpitations.  Gastrointestinal: Negative for diarrhea, blood in stool, abdominal distention  or other pain Genitourinary: Negative for hematuria, flank pain or change in urine volume.  Musculoskeletal: Negative for myalgias or other joint complaints.  Skin: Negative for color change and wound.  Neurological: Negative for syncope and numbness. other than noted Hematological: Negative for adenopathy. or other swelling Psychiatric/Behavioral: Negative for hallucinations, self-injury, decreased concentration or other worsening agitation.      Objective:   Physical Exam BP 128/80  Pulse 73  Temp(Src) 98.2 F (36.8 C) (Oral)  Ht '5\' 11"'  (1.803 m)  Wt 252 lb (114.306 kg)  BMI 35.16 kg/m2  SpO2 98% VS noted,  Constitutional: Pt is oriented to person, place, and time. Appears well-developed and well-nourished. /obesity Head: Normocephalic and atraumatic.  Right Ear: External ear normal.  Left Ear: External ear normal.  Nose: Nose normal.  Mouth/Throat: Oropharynx is clear and moist.  Eyes: Conjunctivae and EOM are normal. Pupils are equal, round, and reactive to light.  Neck: Normal range of motion. Neck supple. No JVD present. No tracheal deviation present.  Cardiovascular: Normal rate, regular rhythm, normal heart sounds and intact distal pulses.   Bilat tm's with mild erythema.  Max sinus areas non tender.  Pharynx with mild erythema, no exudate Pulmonary/Chest: Effort normal and breath sounds without rales or wheezing  Abdominal: Soft. Bowel sounds are normal. NT. No HSM  Musculoskeletal: Normal range of motion. Exhibits no edema.  Lymphadenopathy:  Has no cervical adenopathy.  Neurological: Pt is alert and oriented to person, place, and time. Pt has normal reflexes. No cranial nerve deficit. Motor grossly intact Skin: Skin is warm and dry. No rash noted.  Has mild tender right lumbar paravertebral Spine nontender Psychiatric:  Has depressed/anxiuos mood and affect. Behavior is normal.     Assessment & Plan:   Wt Readings from Last 3 Encounters:  11/19/13 252 lb (114.306  kg)  10/04/13 259 lb 4 oz (117.595 kg)  09/24/13 259 lb (117.482 kg)

## 2013-11-19 NOTE — Assessment & Plan Note (Addendum)
Pt withdrawn at home, not functioning well, to cont celexa as is, also refer psychology, denies SI or HI, with anxiety, for klonopin bid prn, limited rx

## 2013-11-19 NOTE — Assessment & Plan Note (Signed)
With flare msk strain on right - for muscle relaxer prn

## 2013-11-19 NOTE — Progress Notes (Signed)
Pre visit review using our clinic review tool, if applicable. No additional management support is needed unless otherwise documented below in the visit note. 

## 2013-11-19 NOTE — Assessment & Plan Note (Signed)
Cont celexa,  to f/u any worsening symptoms or concerns

## 2013-11-19 NOTE — Assessment & Plan Note (Signed)
Asympt, glc 125 in June 2014, encouraged futher wt control with loss to at least 225, for a1c today

## 2013-11-19 NOTE — Addendum Note (Signed)
Addended by: Sharon Seller B on: 11/19/2013 11:56 AM   Modules accepted: Orders

## 2013-11-19 NOTE — Assessment & Plan Note (Addendum)

## 2013-11-19 NOTE — Assessment & Plan Note (Signed)
Mild to mod flare - for depomedrol IM today

## 2013-11-20 ENCOUNTER — Encounter: Payer: Self-pay | Admitting: Internal Medicine

## 2013-12-03 ENCOUNTER — Other Ambulatory Visit: Payer: Self-pay | Admitting: Internal Medicine

## 2013-12-19 ENCOUNTER — Telehealth: Payer: Self-pay | Admitting: Internal Medicine

## 2013-12-20 ENCOUNTER — Other Ambulatory Visit: Payer: Self-pay | Admitting: Internal Medicine

## 2013-12-20 MED ORDER — CLONAZEPAM 0.5 MG PO TABS: ORAL_TABLET | ORAL | Status: DC

## 2013-12-20 NOTE — Telephone Encounter (Signed)
Done hardcopy to stephanie 

## 2013-12-23 ENCOUNTER — Other Ambulatory Visit: Payer: Self-pay

## 2013-12-23 MED ORDER — ZOLPIDEM TARTRATE 10 MG PO TABS: 10.0000 mg | ORAL_TABLET | Freq: Every evening | ORAL | Status: DC | PRN

## 2013-12-23 NOTE — Telephone Encounter (Signed)
Called [facility] spoke with with Ed gave md authorization for the zolipdem, the clonazepam has already ben sent to them on Friday...Johny Chess

## 2013-12-27 ENCOUNTER — Encounter: Payer: Self-pay | Admitting: Family Medicine

## 2013-12-27 ENCOUNTER — Other Ambulatory Visit (INDEPENDENT_AMBULATORY_CARE_PROVIDER_SITE_OTHER): Payer: Medicare Other

## 2013-12-27 ENCOUNTER — Ambulatory Visit (INDEPENDENT_AMBULATORY_CARE_PROVIDER_SITE_OTHER): Payer: Medicare Other | Admitting: Family Medicine

## 2013-12-27 ENCOUNTER — Other Ambulatory Visit: Payer: Self-pay | Admitting: *Deleted

## 2013-12-27 VITALS — BP 132/84 | HR 73 | Ht 71.0 in | Wt 254.0 lb

## 2013-12-27 DIAGNOSIS — M1711 Unilateral primary osteoarthritis, right knee: Secondary | ICD-10-CM

## 2013-12-27 DIAGNOSIS — M25561 Pain in right knee: Secondary | ICD-10-CM

## 2013-12-27 LAB — CBC WITH DIFFERENTIAL/PLATELET
Basophils Absolute: 0 10*3/uL (ref 0.0–0.1)
Basophils Relative: 0.3 % (ref 0.0–3.0)
Eosinophils Absolute: 0.2 10*3/uL (ref 0.0–0.7)
Eosinophils Relative: 1.2 % (ref 0.0–5.0)
HCT: 45.1 % (ref 39.0–52.0)
Hemoglobin: 14.9 g/dL (ref 13.0–17.0)
Lymphocytes Relative: 12.5 % (ref 12.0–46.0)
Lymphs Abs: 1.8 10*3/uL (ref 0.7–4.0)
MCHC: 33 g/dL (ref 30.0–36.0)
MCV: 97.6 fl (ref 78.0–100.0)
Monocytes Absolute: 1.3 10*3/uL — ABNORMAL HIGH (ref 0.1–1.0)
Monocytes Relative: 9.3 % (ref 3.0–12.0)
Neutro Abs: 10.9 10*3/uL — ABNORMAL HIGH (ref 1.4–7.7)
Neutrophils Relative %: 76.7 % (ref 43.0–77.0)
Platelets: 355 10*3/uL (ref 150.0–400.0)
RBC: 4.62 Mil/uL (ref 4.22–5.81)
RDW: 13.3 % (ref 11.5–15.5)
WBC: 14.2 10*3/uL — ABNORMAL HIGH (ref 4.0–10.5)

## 2013-12-27 LAB — SEDIMENTATION RATE: Sed Rate: 3 mm/hr (ref 0–22)

## 2013-12-27 LAB — C-REACTIVE PROTEIN: CRP: <0.5 mg/dL (ref 0.5–20.0)

## 2013-12-27 MED ORDER — TRAMADOL HCL 50 MG PO TABS: 50.0000 mg | ORAL_TABLET | Freq: Two times a day (BID) | ORAL | Status: DC | PRN

## 2013-12-27 MED ORDER — DOXYCYCLINE HYCLATE 100 MG PO TABS: 100.0000 mg | ORAL_TABLET | Freq: Two times a day (BID) | ORAL | Status: AC

## 2013-12-27 NOTE — Progress Notes (Signed)
[facility] [address] [place], [place] [address] Phone: [phone] Subjective:     CC: Knee pain, right followup  [id number] Harold [name] is a 48 y.o. male coming in with complaint of knee pain. Patient is a past medical history significant for chronic pain syndrome. Patient was seen previously and was given a brace. Patient has not been seen for 3 months after injection of his knee. Patient states that the injection did help for approximately 1 month and then unfortunately he started to have the same pain. Patient unfortunately has had recent deaths in his family including his fiance. Patient states that he has a good support system and is doing relatively well. Patient is taking care of 2 young children on his own now which is admitted much more difficult on his knee. Patient is having significant difficult time with activities. Patient states it is a dull throbbing sensation even if he walks for does not walk. Patient has noticed some mild weakness on this side as well.  PMHx Patient is going to a pain clinic. Patient has primary provider previously and did have x-rays. X-rays were reviewed by me today and shows the patient does have a joint effusion greater than a year ago. Patient also had mild to moderate osteophytic changes of all 3 compartments. Patient states that he went to [facility] and and there he did have surgery with orth[facility] [place] for meniscal tear. Patient states he did not make any improvement in within another 3 months he did have another surgery in September of last year. Patient states that he did have a biocartilage implanted.       Past medical history, social, surgical and family history all reviewed in electronic medical record.   Review of Systems: No headache, visual changes, nausea, vomiting, diarrhea, constipation, dizziness, abdominal pain, skin rash, fevers, chills, night sweats, weight loss, swollen lymph nodes, body  aches, joint swelling, muscle aches, chest pain, shortness of breath, mood changes.   Objective Blood pressure 132/84, pulse 73, height 5\' 11"  (1.803 m), weight 254 lb (115.214 kg), SpO2 98.00%.  General: No apparent distress alert and oriented x3 mood and affect normal, dressed appropriately. obese HEENT: Pupils equal, extraocular movements intact  Respiratory: Patient's speak in full sentences and does not appear short of breath  Cardiovascular: No lower extremity edema, non tender, no erythema  Skin: Warm dry intact with no signs of infection or rash on extremities or on axial skeleton.  Abdomen: Soft nontender  Neuro: Cranial nerves II through XII are intact, neurovascularly intact in all extremities with 2+ DTRs and 2+ pulses.  Lymph: No lymphadenopathy of posterior or anterior cervical chain or axillae bilaterally.  Gait normal with good balance and coordination.  MSK:  Non tender with full range of motion and good stability and symmetric strength and tone of shoulders, elbows, wrist, hip, and ankles bilaterally.  Knee: Right On inspection patient does have what appears to be a trace effusion compared to the contralateral side. In addition this patient does have scar from previous open surgery on the medial portion of the knee. Patient is severely tender to even light palpation. Patient does have most of his pain over the medial joint line. Range of motion lacks the last 5 of extension as well as the last 10 of flexion. Ligaments with solid consistent endpoints including ACL, PCL, LCL, MCL. Positive Mcmurray's, Apley's, and Thessalonian tests.  painful patellar compression. Patellar glide with mild crepitus. Patellar and  quadriceps tendons unremarkable. Hamstring and quadriceps strength is normal.  Contralateral knee unremarkable.  MSK US performed of: Right knee This study was ordered, performed, and interpreted by Charlann Boxer D.O.  Knee: All structures visualized. Patient has  a moderate effusion of the suprapatellar pouch Anteromedial meniscus is what appears to be present. Patient swelling though in this area is not completely fluid. Patient does have what appears to be a potential posterior removal of the meniscus but still some debris is noted. Mild to moderate osteoarthritis secondary to narrowing of the joint space is also appreciated. There is a very large hyperechoic area on the lateral aspect that does have a different density within the bone itself. This is severely tender to palpation during the exam.  Anterolateral,  and posterolateral menisci unremarkable without tearing, fraying, effusion, or displacement. Patellar Tendon unremarkable on long and transverse views without effusion. No abnormality of prepatellar bursa. LCL and MCL unremarkable on long and transverse views. No abnormality of origin of medial or lateral head of the gastrocnemius.  IMPRESSION:  Questionable foreign body and continued effusion  Procedure: Real-time Ultrasound Guided Injection of right knee Device: GE Logiq E  Ultrasound guided injection is preferred based studies that show increased duration, increased effect, greater accuracy, decreased procedural pain, increased response rate, and decreased cost with ultrasound guided versus blind injection.  Verbal informed consent obtained.  Time-out conducted.  Noted no overlying erythema, induration, or other signs of local infection.  Skin prepped in a sterile fashion.  Local anesthesia: Topical Ethyl chloride.  With sterile technique and under real time ultrasound guidance: With a 22-gauge 2 inch needle patient was injected with 4 cc of 0.5% Marcaine an even with direct ultrasound visualization of the effusion no aspiration was possible. Pain immediately resolved suggesting accurate placement of the medication.  Advised to call if fevers/chills, erythema, induration, drainage, or persistent bleeding.  Images permanently stored and  available for review in the ultrasound unit.  Impression: Unable to aspirate knee. .     Impression and Recommendations:     This case required medical decision making of moderate complexity.

## 2013-12-27 NOTE — Patient Instructions (Addendum)
Good to see you Ice is your friend COntinue the sleeve We will get MRI Tramadol up to 3 times daily. Careful with hydrocodone Doxycycline 2 times daily for next 2 weeks.  Come back 1 day after MRi.

## 2013-12-27 NOTE — Assessment & Plan Note (Addendum)
Patient does have severe right knee pain. X-rays show mild tricompartmental Oster arthritic changes. Patient has a recurrent effusion of the knee. I do not think it never actually improved. Patient did have the bile [place] done at [facility] and since then has never been completely pain free. I am concerned and patient actually has a subacromial infection. Patient has had no fever but patient does have some mild atrophy of the musculature at this time. Patient did have a procedure today and we attempted to aspirate the knee but unfortunately the fluid would not come out meaning that this is likely thicker and regular synovial fluid. Patient is also significantly tender T. to light palpation. Patient has had a fairly extensive workup previously to rule out autoimmune diseases. I do feel that further imaging is necessary such as an MRI to rule out any osteomyelitis or any other infectious etiology. This also showing internal derangement of the knee. Patient in the meantime was put on doxycycline as well as given tramadol for pain relief. We discussed icing and bracing regimen. Patient will come back one day after the MRI and we'll go results and discussed further treatment options. Labs ordered today as well. Spent greater than 25 minutes with patient face-to-face and had greater than 50% of counseling including as described above in assessment and plan.

## 2014-01-01 ENCOUNTER — Ambulatory Visit (INDEPENDENT_AMBULATORY_CARE_PROVIDER_SITE_OTHER): Payer: Medicare Other | Admitting: Internal Medicine

## 2014-01-01 ENCOUNTER — Encounter: Payer: Self-pay | Admitting: Internal Medicine

## 2014-01-01 ENCOUNTER — Ambulatory Visit (INDEPENDENT_AMBULATORY_CARE_PROVIDER_SITE_OTHER)
Admission: RE | Admit: 2014-01-01 | Discharge: 2014-01-01 | Disposition: A | Discharge status: Home / Self Care | Payer: Medicare Other | Source: Ambulatory Visit | Attending: Family Medicine | Admitting: Family Medicine

## 2014-01-01 VITALS — BP 140/82 | HR 91 | Temp 97.6°F | Ht 71.0 in | Wt 257.2 lb

## 2014-01-01 DIAGNOSIS — R05 Cough: Secondary | ICD-10-CM

## 2014-01-01 DIAGNOSIS — R059 Cough, unspecified: Secondary | ICD-10-CM | POA: Insufficient documentation

## 2014-01-01 DIAGNOSIS — I10 Essential (primary) hypertension: Secondary | ICD-10-CM

## 2014-01-01 DIAGNOSIS — M549 Dorsalgia, unspecified: Secondary | ICD-10-CM

## 2014-01-01 DIAGNOSIS — M25561 Pain in right knee: Secondary | ICD-10-CM

## 2014-01-01 DIAGNOSIS — G8929 Other chronic pain: Secondary | ICD-10-CM

## 2014-01-01 MED ORDER — HYDROCODONE-HOMATROPINE 5-1.5 MG/5ML PO SYRP: 5.0000 mL | ORAL_SOLUTION | Freq: Four times a day (QID) | ORAL | Status: DC | PRN

## 2014-01-01 MED ORDER — CEFTRIAXONE SODIUM 1 G IJ SOLR
1.0000 g | Freq: Once | INTRAMUSCULAR | Status: AC
  Administered 2014-01-01: 1 g via INTRAMUSCULAR

## 2014-01-01 MED ORDER — HYDROCODONE-ACETAMINOPHEN 7.5-325 MG PO TABS: 1.0000 | ORAL_TABLET | Freq: Four times a day (QID) | ORAL | Status: DC | PRN

## 2014-01-01 MED ORDER — LEVOFLOXACIN 500 MG PO TABS: 500.0000 mg | ORAL_TABLET | Freq: Every day | ORAL | Status: DC

## 2014-01-01 NOTE — Progress Notes (Signed)
Pre visit review using our clinic review tool, if applicable. No additional management support is needed unless otherwise documented below in the visit note. 

## 2014-01-01 NOTE — Assessment & Plan Note (Signed)
stable overall by history and exam, recent data reviewed with pt, and pt to continue medical treatment as before,  to f/u any worsening symptoms or concerns BP Readings from Last 3 Encounters:  01/01/14 140/82  12/27/13 132/84  11/19/13 128/80

## 2014-01-01 NOTE — Patient Instructions (Signed)
You appear to have possible pneumonia right lower lung  You had the antibiotic shot today  Please take all new medication as prescribed - the antibiotic pills, and cough medicine  You can also take Mucinex (or it's generic off brand) for congestion, and tylenol as needed for pain.  Please continue all other medications as before, and refills have been done if requested - the pain medication for 2 more months  Remember, the only insurance not associated with [name] is [place] [employer], so if this is the issue, you may have to see your assigned Physician for the pain clinic referral  Please keep your appointments with your specialists as you may have planned  Please go to the XRAY Department in the Basement (go straight as you get off the elevator) for the x-ray testing  You will be contacted by phone if any changes need to be made immediately.  Otherwise, you will receive a letter about your results with an explanation, but please check with MyChart first.  Please remember to sign up for MyChart if you have not done so, as this will be important to you in the future with finding out test results, communicating by private email, and scheduling acute appointments online when needed.

## 2014-01-01 NOTE — Assessment & Plan Note (Signed)
Pt with insurance issue, unable to get pain clinic referral accomplished so far, has medicaid and may have an assigned MD to have this done, for 2 mo further pain med refill today

## 2014-01-01 NOTE — Assessment & Plan Note (Signed)
?   Bronchitis vs pna - for cxr, also rocephin IM, levaquin asd, cough med prn use,  to f/u any worsening symptoms or concerns

## 2014-01-01 NOTE — Progress Notes (Signed)
Subjective:    Patient ID: Harold [name], male    DOB: 08/31/1965, 48 y.o.   MRN: [id number]  HPI  Here to f/u with acute; c/o 2-3 days onset fever, prod cough greenish yellow, no blood, with chest bronchial congestion, but no wheezing or sob or CP.  Was noted incidentally recently with elev WBC, now on doxy.  Grandson ill with ? Viral illness 2 wks ago.  Past Medical History  Diagnosis Date  . Obesity   . Metabolic syndrome   . Hypertension   . Depression   . GERD (gastroesophageal reflux disease)   . Chronic back pain   . History of pancreatitis     a. admx 04-2009.Marland KitchenMarland Kitchen? 2-2 triglycerides  . Hypertriglyceridemia     a. followed by [facility]  . Acute pericarditis     admx 03-26-10 thru 03-29-10; a. echo 03-26-10: EF 55-60%; mild LVH; trivial MR; RVF ok; mild -mod circumferential Eff w/o tamponade  . Constrictive pericarditis   . OSA (obstructive sleep apnea)   . Gout     pt denies  . Family history of malignant neoplasm of gastrointestinal tract   . Allergic rhinitis, cause unspecified 09/14/2012  . DDD (degenerative disc disease), lumbar 09/14/2012  . Left sided sciatica 09/14/2012  . Erectile dysfunction 09/14/2012  . Hyperlipidemia 07/11/2013   Past Surgical History  Procedure Laterality Date  . Tonsillectomy and adenoidectomy      one tonsil  . Pericardectomy  07/15/2010    Dr. [name]  . Pleurx catheter placement  07/15/2010    Dr. [name]  . Shoulder surgery      right and left shoulders  . Knee surgery      right    reports that he has been smoking Cigarettes.  He has a 200 pack-year smoking history. He has never used smokeless tobacco. He reports that he drinks alcohol. He reports that he does not use illicit drugs. family history includes Colon cancer in his father; Diabetes in his father, mother, and sister; Heart disease in his father; Hypertension in his other; Liver cancer in his paternal uncle; Pancreatitis in his father and paternal uncle; Stomach cancer in  his maternal grandfather. There is no history of Esophageal cancer or Rectal cancer. Allergies  Allergen Reactions  . Colchicine     REACTION: diarrhea  . Diphenhydramine Hcl     REACTION: itchingm :jittery"  . Lipitor [Atorvastatin]     Muscle cramps  . Other Other (See Comments)    P[place]ans Reaction: itching and swelling of the tongue    Current Outpatient Prescriptions on File Prior to Visit  Medication Sig Dispense Refill  . amLODipine (NORVASC) 10 MG tablet Take 1 tablet (10 mg total) by mouth daily.  90 tablet  3  . aspirin 81 MG EC tablet Take 1 tablet (81 mg total) by mouth daily. Swallow whole.  30 tablet  12  . atorvastatin (LIPITOR) 20 MG tablet Take 1 tablet (20 mg total) by mouth daily.  90 tablet  3  . citalopram (CELEXA) 40 MG tablet TAKE 1 TABLET BY MOUTH DAILY  90 tablet  3  . clonazePAM (KLONOPIN) 0.5 MG tablet TAKE 1 TABLET BY MOUTH TWICE DAILY AS NEEDED FOR ANXIETY  60 tablet  1  . cyclobenzaprine (FLEXERIL) 10 MG tablet 1/2 - 1 tab by mouth three times per day For muscle spasms  90 tablet  1  . Diclofenac Sodium (PENNSAID) 2 % SOLN Place 2 application onto the skin 2 (  two) times daily.  112 g  3  . Diclofenac Sodium 2 % SOLN Apply twice daily  112 g  1  . doxycycline (VIBRA-TABS) 100 MG tablet Take 1 tablet (100 mg total) by mouth 2 (two) times daily.  28 tablet  0  . fenofibrate (TRICOR) 145 MG tablet TAKE ONE TABLET BY MOUTH DAILY  90 tablet  3  . furosemide (LASIX) 20 MG tablet TAKE ONE TABLET BY MOUTH DAILY  90 tablet  3  . ketoconazole (NIZORAL) 2 % cream Apply 1 application topically daily.  30 g  1  . lisinopril (PRINIVIL,ZESTRIL) 20 MG tablet TAKE ONE TABLET BY MOUTH TWICE DAILY  180 tablet  3  . potassium chloride (K-DUR) 10 MEQ tablet TAKE ONE TABLET BY MOUTH DAILY  90 tablet  3  . rosuvastatin (CRESTOR) 20 MG tablet Take 1 tablet (20 mg total) by mouth daily.  90 tablet  3  . traMADol (ULTRAM) 50 MG tablet Take 1 tablet (50 mg total) by mouth every 12  (twelve) hours as needed.  30 tablet  0  . zolpidem (AMBIEN) 10 MG tablet Take 1 tablet (10 mg total) by mouth at bedtime as needed for sleep.  30 tablet  1  . [DISCONTINUED] amoxicillin-clarithromycin-lansoprazole (PREVPAC) combo pack Take by mouth 2 (two) times daily. Follow package directions.  1 kit  0   No current facility-administered medications on file prior to visit.   Review of Systems  Constitutional: Negative for unusual diaphoresis or other sweats  HENT: Negative for ringing in ear Eyes: Negative for double vision or worsening visual disturbance.  Respiratory: Negative for choking and stridor.   Gastrointestinal: Negative for vomiting or other signifcant bowel change Genitourinary: Negative for hematuria or decreased urine volume.  Musculoskeletal: Negative for other MSK pain or swelling Skin: Negative for color change and worsening wound.  Neurological: Negative for tremors and numbness other than noted  Psychiatric/Behavioral: Negative for decreased concentration or agitation other than above       Objective:   Physical Exam BP 140/82  Pulse 91  Temp(Src) 97.6 F (36.4 C) (Oral)  Ht '5\' 11"'  (1.803 m)  Wt 257 lb 4 oz (116.688 kg)  BMI 35.90 kg/m2  SpO2 97% VS noted,  Constitutional: Pt appears well-developed, well-nourished.  HENT: Head: NCAT.  Right Ear: External ear normal.  Left Ear: External ear normal.  Eyes: . Pupils are equal, round, and reactive to light. Conjunctivae and EOM are normal Neck: Normal range of motion. Neck supple.  Cardiovascular: Normal rate and regular rhythm.   Pulmonary/Chest: Effort normal and breath sounds decresed right base, no overt rales or wheezing Neurological: Pt is alert. Not confused , motor grossly intact Skin: Skin is warm. No rash Psychiatric: Pt behavior is normal. No agitation.      Assessment & Plan:

## 2014-01-02 ENCOUNTER — Telehealth: Payer: Self-pay | Admitting: Internal Medicine

## 2014-01-02 ENCOUNTER — Encounter: Payer: Self-pay | Admitting: Internal Medicine

## 2014-01-02 NOTE — Telephone Encounter (Signed)
emmi emailed °

## 2014-01-09 ENCOUNTER — Ambulatory Visit: Payer: Medicare Other | Admitting: Internal Medicine

## 2014-01-09 ENCOUNTER — Ambulatory Visit
Admission: RE | Admit: 2014-01-09 | Discharge: 2014-01-09 | Disposition: A | Discharge status: Home / Self Care | Payer: Medicare Other | Source: Ambulatory Visit | Attending: Family Medicine | Admitting: Family Medicine

## 2014-01-09 DIAGNOSIS — M1711 Unilateral primary osteoarthritis, right knee: Secondary | ICD-10-CM

## 2014-01-16 ENCOUNTER — Ambulatory Visit (INDEPENDENT_AMBULATORY_CARE_PROVIDER_SITE_OTHER): Payer: Medicare Other | Admitting: Family Medicine

## 2014-01-16 ENCOUNTER — Encounter: Payer: Self-pay | Admitting: Family Medicine

## 2014-01-16 VITALS — BP 120/70 | HR 83 | Wt 254.0 lb

## 2014-01-16 DIAGNOSIS — M659 Synovitis and tenosynovitis, unspecified: Secondary | ICD-10-CM

## 2014-01-16 DIAGNOSIS — M25561 Pain in right knee: Secondary | ICD-10-CM

## 2014-01-16 NOTE — Progress Notes (Signed)
[facility] [address] [place], [place] [address] Phone: [phone] Subjective:     CC: Knee pain, right followup  [id number] Harold [name] is a 48 y.o. male coming in with complaint of knee pain. Patient is a past medical history significant for chronic pain syndrome. Patient was seen previously and was given a brace. Patient does have a past medical history significant for a bio cartilage replacement in this knee. Patient has had 2 corticosteroid injections. Patient was seen previously and concern for a possible loose body or infection due to the pain.  . Patient had a mild improvement. Patient continued to have pain so an MRI was ordered. With ultrasound there appeared to be questionable particulate matter within the joint space itself. Patient is here for further evaluation of the MRI. MRI showed the patient did have postoperative changes of the medial meniscus as well as moderate osteophytic changes. The most concerning finding was a synovitis filling the interchondral notch with displacement of the normal fat pad.  Patient states that the pain is still affecting his daily activities. Patient states that it is waking him up at night as well. Patient states that it feels like something is moving within his knee.   PMHx Patient is going to a pain clinic. 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 Patient states that he went to [facility] and and there he did have surgery with ortho [place] for meniscal tear. Patient states that he did have a biocartilage implanted. Since the implanted BioCore patient has not made any improvement.    Past medical history, social, surgical and family history all reviewed in electronic medical record.   Review of Systems: No headache, visual changes, nausea, vomiting, diarrhea, constipation, dizziness, abdominal pain, skin rash,  fevers, chills, night sweats, weight loss, swollen lymph nodes, body aches, joint swelling, muscle aches, chest pain, shortness of breath, mood changes.   Objective Blood pressure 120/70, pulse 83, weight 254 lb (115.214 kg), SpO2 96.00%.  General: No apparent distress alert and oriented x3 mood and affect normal, dressed appropriately. obese HEENT: Pupils equal, extraocular movements intact  Respiratory: Patient's speak in full sentences and does not appear short of breath  Cardiovascular: No lower extremity edema, non tender, no erythema  Skin: Warm dry intact with no signs of infection or rash on extremities or on axial skeleton.  Abdomen: Soft nontender  Neuro: Cranial nerves II through XII are intact, neurovascularly intact in all extremities with 2+ DTRs and 2+ pulses.  Lymph: No lymphadenopathy of posterior or anterior cervical chain or axillae bilaterally.  Gait normal with good balance and coordination.  MSK:  Non tender with full range of motion and good stability and symmetric strength and tone of shoulders, elbows, wrist, hip, and ankles bilaterally.  Knee: Right On inspection patient does have what appears to be a trace effusion compared to the contralateral side. In addition this patient does have scar from previous open surgery on the medial portion of the knee. Patient is severely tender to even light palpation. Patient does have most of his pain over the medial joint line. Range of motion lacks the last 5 of extension as well as the last 10 of flexion. Ligaments with solid consistent endpoints including ACL, PCL, LCL, MCL. Positive Mcmurray's, Apley's, and Thessalonian tests.  painful patellar compression. Patellar glide with mild crepitus. Patellar and quadriceps tendons unremarkable. Hamstring and  quadriceps strength is normal.  Contralateral knee unremarkable. No significant difference in      Impression and Recommendations:     This case required medical  decision making of moderate complexity.

## 2014-01-16 NOTE — Assessment & Plan Note (Signed)
With patient's severe pain that has not responded to conservative therapies and patient's MRI findings of a synovitis that could be an allergic reaction to the bio cartilage patient has and talking to another specialist I do think that further intervention is likely necessary and surgical consultation would be helpful. Patient will be referred to orthopedic surgery patient encouraged to continue the bracing, icing, and home exercises. Patient knows if he has any questions he is more than welcome to call or come back. Patient has other chronic pain problems and after patient's deals with his knee he will come back and we will discuss further.

## 2014-01-16 NOTE — Patient Instructions (Signed)
Good to see you I am here if you have questions.  Dr. Rhona Raider at [facility] [place]. [address]  They will call you and get you  .  Continue the exercises See me after everything for your back.

## 2014-02-06 ENCOUNTER — Telehealth: Payer: Self-pay | Admitting: Family Medicine

## 2014-02-06 NOTE — Telephone Encounter (Signed)
Rec'd from [facility] forward 2 pages to Dr. Tamala Julian

## 2014-02-28 ENCOUNTER — Encounter: Payer: Self-pay | Admitting: Internal Medicine

## 2014-02-28 ENCOUNTER — Ambulatory Visit (INDEPENDENT_AMBULATORY_CARE_PROVIDER_SITE_OTHER): Payer: Medicare Other | Admitting: Internal Medicine

## 2014-02-28 VITALS — BP 140/76 | HR 76 | Temp 98.1°F | Ht 71.0 in | Wt 252.2 lb

## 2014-02-28 DIAGNOSIS — J019 Acute sinusitis, unspecified: Secondary | ICD-10-CM | POA: Insufficient documentation

## 2014-02-28 DIAGNOSIS — J018 Other acute sinusitis: Secondary | ICD-10-CM

## 2014-02-28 DIAGNOSIS — R21 Rash and other nonspecific skin eruption: Secondary | ICD-10-CM | POA: Insufficient documentation

## 2014-02-28 DIAGNOSIS — J3089 Other allergic rhinitis: Secondary | ICD-10-CM

## 2014-02-28 MED ORDER — HYDROCODONE-HOMATROPINE 5-1.5 MG/5ML PO SYRP: 5.0000 mL | ORAL_SOLUTION | Freq: Four times a day (QID) | ORAL | Status: DC | PRN

## 2014-02-28 MED ORDER — KETOCONAZOLE 2 % EX CREA: 1.0000 "application " | TOPICAL_CREAM | Freq: Every day | CUTANEOUS | Status: DC

## 2014-02-28 MED ORDER — METHYLPREDNISOLONE ACETATE 80 MG/ML IJ SUSP
80.0000 mg | Freq: Once | INTRAMUSCULAR | Status: AC
  Administered 2014-02-28: 80 mg via INTRAMUSCULAR

## 2014-02-28 MED ORDER — AZITHROMYCIN 250 MG PO TABS: ORAL_TABLET | ORAL | Status: DC

## 2014-02-28 NOTE — Progress Notes (Signed)
Pre visit review using our clinic review tool, if applicable. No additional management support is needed unless otherwise documented below in the visit note. 

## 2014-02-28 NOTE — Progress Notes (Signed)
Subjective:    Patient ID: Harold [name], male    DOB: April 10, 1965, 48 y.o.   MRN: [id number]  HPI   Here with 2-3 days acute onset fever, facial pain, pressure, headache, general weakness and malaise, and greenish d/c, with mild ST and cough, but pt denies chest pain, wheezing, increased sob or doe, orthopnea, PND, increased LE swelling, palpitations, dizziness or syncope. Does have several wks ongoing nasal allergy symptoms with clearish congestion, itch and sneezing, without fever, pain, ST, cough, swelling or wheezing.  Also with rash to feet that has recurred, better prior with fungal cream., nontender, no ulcers Past Medical History  Diagnosis Date  . Obesity   . Metabolic syndrome   . Hypertension   . Depression   . GERD (gastroesophageal reflux disease)   . Chronic back pain   . History of pancreatitis     a. admx 04-2009.Marland KitchenMarland Kitchen? 2-2 triglycerides  . Hypertriglyceridemia     a. followed by [facility]  . Acute pericarditis     admx 03-26-10 thru 03-29-10; a. echo 03-26-10: EF 55-60%; mild LVH; trivial MR; RVF ok; mild -mod circumferential Eff w/o tamponade  . Constrictive pericarditis   . OSA (obstructive sleep apnea)   . Gout     pt denies  . Family history of malignant neoplasm of gastrointestinal tract   . Allergic rhinitis, cause unspecified 09/14/2012  . DDD (degenerative disc disease), lumbar 09/14/2012  . Left sided sciatica 09/14/2012  . Erectile dysfunction 09/14/2012  . Hyperlipidemia 07/11/2013   Past Surgical History  Procedure Laterality Date  . Tonsillectomy and adenoidectomy      one tonsil  . Pericardectomy  07/15/2010    Dr. [name]  . Pleurx catheter placement  07/15/2010    Dr. [name]  . Shoulder surgery      right and left shoulders  . Knee surgery      right    reports that he has been smoking Cigarettes.  He has a 200 pack-year smoking history. He has never used smokeless tobacco. He reports that he drinks alcohol. He reports that he does not use  illicit drugs. family history includes Colon cancer in his father; Diabetes in his father, mother, and sister; Heart disease in his father; Hypertension in his other; Liver cancer in his paternal uncle; Pancreatitis in his father and paternal uncle; Stomach cancer in his maternal grandfather. There is no history of Esophageal cancer or Rectal cancer. Allergies  Allergen Reactions  . Colchicine     REACTION: diarrhea  . Diphenhydramine Hcl     REACTION: itchingm :jittery"  . Lipitor [Atorvastatin]     Muscle cramps  . Other Other (See Comments)    Pecans Reaction: itching and swelling of the tongue    Current Outpatient Prescriptions on File Prior to Visit  Medication Sig Dispense Refill  . amLODipine (NORVASC) 10 MG tablet Take 1 tablet (10 mg total) by mouth daily. 90 tablet 3  . aspirin 81 MG EC tablet Take 1 tablet (81 mg total) by mouth daily. Swallow whole. 30 tablet 12  . atorvastatin (LIPITOR) 20 MG tablet Take 1 tablet (20 mg total) by mouth daily. 90 tablet 3  . citalopram (CELEXA) 40 MG tablet TAKE 1 TABLET BY MOUTH DAILY 90 tablet 3  . clonazePAM (KLONOPIN) 0.5 MG tablet TAKE 1 TABLET BY MOUTH TWICE DAILY AS NEEDED FOR ANXIETY 60 tablet 1  . cyclobenzaprine (FLEXERIL) 10 MG tablet 1/2 - 1 tab by mouth three times per day  For muscle spasms 90 tablet 1  . Diclofenac Sodium (PENNSAID) 2 % SOLN Place 2 application onto the skin 2 (two) times daily. 112 g 3  . fenofibrate (TRICOR) 145 MG tablet TAKE ONE TABLET BY MOUTH DAILY 90 tablet 3  . furosemide (LASIX) 20 MG tablet TAKE ONE TABLET BY MOUTH DAILY 90 tablet 3  . HYDROcodone-acetaminophen (NORCO) 7.5-325 MG per tablet Take 1 tablet by mouth every 6 (six) hours as needed. To fill Feb 03, 2014 120 tablet 0  . lisinopril (PRINIVIL,ZESTRIL) 20 MG tablet TAKE ONE TABLET BY MOUTH TWICE DAILY 180 tablet 3  . potassium chloride (K-DUR) 10 MEQ tablet TAKE ONE TABLET BY MOUTH DAILY 90 tablet 3  . rosuvastatin (CRESTOR) 20 MG tablet Take 1  tablet (20 mg total) by mouth daily. 90 tablet 3  . traMADol (ULTRAM) 50 MG tablet Take 1 tablet (50 mg total) by mouth every 12 (twelve) hours as needed. 30 tablet 0  . zolpidem (AMBIEN) 10 MG tablet Take 1 tablet (10 mg total) by mouth at bedtime as needed for sleep. 30 tablet 1  . [DISCONTINUED] amoxicillin-clarithromycin-lansoprazole (PREVPAC) combo pack Take by mouth 2 (two) times daily. Follow package directions. 1 kit 0   No current facility-administered medications on file prior to visit.   Review of Systems  Constitutional: Negative for unusual diaphoresis or other sweats  HENT: Negative for ringing in ear Eyes: Negative for double vision or worsening visual disturbance.  Respiratory: Negative for choking and stridor.   Gastrointestinal: Negative for vomiting or other signifcant bowel change Genitourinary: Negative for hematuria or decreased urine volume.  Musculoskeletal: Negative for other MSK pain or swelling Skin: Negative for color change and worsening wound.  Neurological: Negative for tremors and numbness other than noted  Psychiatric/Behavioral: Negative for decreased concentration or agitation other than above  ;    Objective:   Physical Exam BP 140/76 mmHg  Pulse 76  Temp(Src) 98.1 F (36.7 C) (Oral)  Ht _0  (1.803 m)  Wt 252 lb 4 oz (114.42 kg)  BMI 35.20 kg/m2  SpO2 97% VS noted, mild ill Constitutional: Pt appears well-developed, well-nourished.  HENT: Head: NCAT.  Right Ear: External ear normal.  Left Ear: External ear normal.  Eyes: . Pupils are equal, round, and reactive to light. Conjunctivae and EOM are normal Bilat tm's with mild erythema.  Max sinus areas mild tender.  Pharynx with mild erythema, no exudate Neck: Normal range of motion. Neck supple.  Cardiovascular: Normal rate and regular rhythm.   Pulmonary/Chest: Effort normal and breath sounds normal.  Neurological: Pt is alert. Not confused , motor grossly intact Skin: Skin is warm. Bilat  feet silvery scaly rash to lateral areas bilat feet, nontender, no ulcers Psychiatric: Pt behavior is normal. No agitation.     Assessment & Plan:

## 2014-02-28 NOTE — Patient Instructions (Signed)
You had the steroid shot today  Please take all new medication as prescribed - the antibiotic  Please continue all other medications as before, and refills have been done if requested - the cream  Please have the pharmacy call with any other refills you may need.  Please keep your appointments with your specialists as you may have planned

## 2014-03-01 NOTE — Assessment & Plan Note (Signed)
C/w fungal - for refill/restart antufungal cream prn, keep feet dry, change socks regularly

## 2014-03-01 NOTE — Assessment & Plan Note (Addendum)
Mild to mod seasonal flare, for depomedrol IM,  to f/u any worsening symptoms or concerns

## 2014-03-01 NOTE — Assessment & Plan Note (Signed)
Mild to mod, for antibx course,  to f/u any worsening symptoms or concerns 

## 2014-04-04 ENCOUNTER — Telehealth: Payer: Self-pay | Admitting: Internal Medicine

## 2014-04-04 ENCOUNTER — Other Ambulatory Visit: Payer: Self-pay | Admitting: Internal Medicine

## 2014-04-04 DIAGNOSIS — M5489 Other dorsalgia: Secondary | ICD-10-CM

## 2014-04-04 NOTE — Telephone Encounter (Signed)
Pt called said that he needs to see if Dr Jenny Reichmann could send a referral to :  [place]

## 2014-04-07 NOTE — Telephone Encounter (Signed)
Referral done

## 2014-04-08 NOTE — Telephone Encounter (Signed)
Notified ot md has place referral.../lmb

## 2014-04-10 ENCOUNTER — Encounter: Payer: Self-pay | Admitting: Internal Medicine

## 2014-04-10 ENCOUNTER — Ambulatory Visit (INDEPENDENT_AMBULATORY_CARE_PROVIDER_SITE_OTHER): Payer: Commercial Managed Care - HMO | Admitting: Internal Medicine

## 2014-04-10 VITALS — BP 144/92 | HR 79 | Temp 98.7°F | Ht 71.0 in | Wt 257.8 lb

## 2014-04-10 DIAGNOSIS — M549 Dorsalgia, unspecified: Secondary | ICD-10-CM

## 2014-04-10 DIAGNOSIS — M5442 Lumbago with sciatica, left side: Secondary | ICD-10-CM

## 2014-04-10 DIAGNOSIS — R7302 Impaired glucose tolerance (oral): Secondary | ICD-10-CM | POA: Diagnosis not present

## 2014-04-10 DIAGNOSIS — I10 Essential (primary) hypertension: Secondary | ICD-10-CM | POA: Diagnosis not present

## 2014-04-10 DIAGNOSIS — G8929 Other chronic pain: Secondary | ICD-10-CM | POA: Diagnosis not present

## 2014-04-10 DIAGNOSIS — Z720 Tobacco use: Secondary | ICD-10-CM

## 2014-04-10 DIAGNOSIS — F17200 Nicotine dependence, unspecified, uncomplicated: Secondary | ICD-10-CM

## 2014-04-10 MED ORDER — VARENICLINE TARTRATE 1 MG PO TABS: 1.0000 mg | ORAL_TABLET | Freq: Two times a day (BID) | ORAL | Status: DC

## 2014-04-10 MED ORDER — HYDROCODONE-ACETAMINOPHEN 7.5-325 MG PO TABS: 1.0000 | ORAL_TABLET | Freq: Four times a day (QID) | ORAL | Status: DC | PRN

## 2014-04-10 MED ORDER — METHYLPREDNISOLONE ACETATE 80 MG/ML IJ SUSP
80.0000 mg | Freq: Once | INTRAMUSCULAR | Status: AC
  Administered 2014-04-10: 80 mg via INTRAMUSCULAR

## 2014-04-10 MED ORDER — VARENICLINE TARTRATE 0.5 MG X 11 & 1 MG X 42 PO MISC: ORAL | Status: DC

## 2014-04-10 MED ORDER — PREDNISONE 10 MG PO TABS: ORAL_TABLET | ORAL | Status: DC

## 2014-04-10 NOTE — Progress Notes (Signed)
Subjective:    Patient ID: Harold [name], male    DOB: 15-Oct-1965, 49 y.o.   MRN: [id number]  HPI  Here with acute on chronic recurring LBP - Pt continues to have recurring LBP without change in severity, bowel or bladder change, fever, wt loss,  worsening LE pain/numbness/weakness, gait change or falls, until 4days ago with sudden worsening LBP with left sided radiation to the mid post left thigh.  No LE numbness or weakness.Pt denies chest pain, increased sob or doe, wheezing, orthopnea, PND, increased LE swelling, palpitations, dizziness or syncope.   Pt denies polydipsia, polyuria, Wants to quit smoking, hard without help Past Medical History  Diagnosis Date  . Obesity   . Metabolic syndrome   . Hypertension   . Depression   . GERD (gastroesophageal reflux disease)   . Chronic back pain   . History of pancreatitis     a. admx 04-2009.Marland KitchenMarland Kitchen? 2-2 triglycerides  . Hypertriglyceridemia     a. followed by [facility]  . Acute pericarditis     admx 03-26-10 thru 03-29-10; a. echo 03-26-10: EF 55-60%; mild LVH; trivial MR; RVF ok; mild -mod circumferential Eff w/o tamponade  . Constrictive pericarditis   . OSA (obstructive sleep apnea)   . Gout     pt denies  . Family history of malignant neoplasm of gastrointestinal tract   . Allergic rhinitis, cause unspecified 09/14/2012  . DDD (degenerative disc disease), lumbar 09/14/2012  . Left sided sciatica 09/14/2012  . Erectile dysfunction 09/14/2012  . Hyperlipidemia 07/11/2013   Past Surgical History  Procedure Laterality Date  . Tonsillectomy and adenoidectomy      one tonsil  . Pericardectomy  07/15/2010    Dr. [name]  . Pleurx catheter placement  07/15/2010    Dr. [name]  . Shoulder surgery      right and left shoulders  . Knee surgery      right    reports that he has been smoking Cigarettes.  He has a 200 pack-year smoking history. He has never used smokeless tobacco. He reports that he drinks alcohol. He reports that he does  not use illicit drugs. family history includes Colon cancer in his father; Diabetes in his father, mother, and sister; Heart disease in his father; Hypertension in his other; Liver cancer in his paternal uncle; Pancreatitis in his father and paternal uncle; Stomach cancer in his maternal grandfather. There is no history of Esophageal cancer or Rectal cancer. Allergies  Allergen Reactions  . Colchicine     REACTION: diarrhea  . Diphenhydramine Hcl     REACTION: itchingm :jittery"  . Lipitor [Atorvastatin]     Muscle cramps  . Other Other (See Comments)    Pecans Reaction: itching and swelling of the tongue    Current Outpatient Prescriptions on File Prior to Visit  Medication Sig Dispense Refill  . amLODipine (NORVASC) 10 MG tablet TAKE ONE TABLET BY MOUTH DAILY 90 tablet 2  . atorvastatin (LIPITOR) 20 MG tablet Take 1 tablet (20 mg total) by mouth daily. 90 tablet 3  . citalopram (CELEXA) 40 MG tablet TAKE 1 TABLET BY MOUTH DAILY 90 tablet 3  . clonazePAM (KLONOPIN) 0.5 MG tablet TAKE 1 TABLET BY MOUTH TWICE DAILY AS NEEDED FOR ANXIETY 60 tablet 1  . Diclofenac Sodium (PENNSAID) 2 % SOLN Place 2 application onto the skin 2 (two) times daily. 112 g 3  . fenofibrate (TRICOR) 145 MG tablet TAKE ONE TABLET BY MOUTH DAILY 90 tablet 3  .  furosemide (LASIX) 20 MG tablet TAKE ONE TABLET BY MOUTH DAILY 90 tablet 3  . ketoconazole (NIZORAL) 2 % cream Apply 1 application topically daily. 30 g 1  . lisinopril (PRINIVIL,ZESTRIL) 20 MG tablet TAKE ONE TABLET BY MOUTH TWICE DAILY 180 tablet 3  . methocarbamol (ROBAXIN) 500 MG tablet   0  . potassium chloride (K-DUR) 10 MEQ tablet TAKE ONE TABLET BY MOUTH DAILY 90 tablet 3  . rosuvastatin (CRESTOR) 20 MG tablet Take 1 tablet (20 mg total) by mouth daily. 90 tablet 3  . aspirin 81 MG EC tablet Take 1 tablet (81 mg total) by mouth daily. Swallow whole. (Patient not taking: Reported on 04/10/2014) 30 tablet 12  . [DISCONTINUED]  amoxicillin-clarithromycin-lansoprazole (PREVPAC) combo pack Take by mouth 2 (two) times daily. Follow package directions. 1 kit 0   No current facility-administered medications on file prior to visit.   Review of Systems  Constitutional: Negative for unusual diaphoresis or other sweats  HENT: Negative for ringing in ear Eyes: Negative for double vision or worsening visual disturbance.  Respiratory: Negative for choking and stridor.   Gastrointestinal: Negative for vomiting or other signifcant bowel change Genitourinary: Negative for hematuria or decreased urine volume.  Musculoskeletal: Negative for other MSK pain or swelling Skin: Negative for color change and worsening wound.  Neurological: Negative for tremors and numbness other than noted  Psychiatric/Behavioral: Negative for decreased concentration or agitation other than above       Objective:   Physical Exam BP 144/92 mmHg  Pulse 79  Temp(Src) 98.7 F (37.1 C) (Oral)  Ht '5\' 11"'  (1.803 m)  Wt 257 lb 12 oz (116.915 kg)  BMI 35.96 kg/m2  SpO2 97% VS noted,  Constitutional: Pt appears well-developed, well-nourished.  HENT: Head: NCAT.  Right Ear: External ear normal.  Left Ear: External ear normal.  Eyes: . Pupils are equal, round, and reactive to light. Conjunctivae and EOM are normal Neck: Normal range of motion. Neck supple.  Cardiovascular: Normal rate and regular rhythm.   Pulmonary/Chest: Effort normal and breath sounds without rales or wheezing.  Abd:  Soft, NT, ND, + BS Spine: nontender except for left lower lumbar paravertebral and left upper buttock, no swelling, erythema Neuro: Pt is alert. Not confused , motor/sens/dtr intact to LE's Skin: Skin is warm. No rash Psychiatric: Pt behavior is normal. No agitation.     Assessment & Plan:

## 2014-04-10 NOTE — Progress Notes (Signed)
Pre visit review using our clinic review tool, if applicable. No additional management support is needed unless otherwise documented below in the visit note. 

## 2014-04-10 NOTE — Patient Instructions (Signed)
You had the steroid shot today  Please take all new medication as prescribed - the pain medication (given hardcopy today), prednisone (sent to the pharmacy), and the robaxin you already have at home, as well as the Chantix for smoking  Please continue all other medications as before, and refills have been done if requested.  Please have the pharmacy call with any other refills you may need.  Please keep your appointments with your specialists as you may have planned   Please return for any worsening pain or leg weakness

## 2014-04-11 DIAGNOSIS — M1711 Unilateral primary osteoarthritis, right knee: Secondary | ICD-10-CM | POA: Diagnosis not present

## 2014-04-11 DIAGNOSIS — M1731 Unilateral post-traumatic osteoarthritis, right knee: Secondary | ICD-10-CM | POA: Diagnosis not present

## 2014-04-13 NOTE — Assessment & Plan Note (Signed)
stable overall by history and exam, recent data reviewed with pt, and pt to continue medical treatment as before,  to f/u any worsening symptoms or concerns Lab Results  Component Value Date   HGBA1C 6.5 11/19/2013   For f/u for onset polys or cbg > 200 with steroid tx

## 2014-04-13 NOTE — Assessment & Plan Note (Signed)
D/w pt, for chantix asd,  to f/u any worsening symptoms or concerns

## 2014-04-13 NOTE — Assessment & Plan Note (Signed)
Acute on chronic, no neuro change, for pain control, muscle relaxer, predpac asd,  to f/u any worsening symptoms or concerns, consider MRI, consider [facility] referral

## 2014-04-18 DIAGNOSIS — M1711 Unilateral primary osteoarthritis, right knee: Secondary | ICD-10-CM | POA: Diagnosis not present

## 2014-04-18 DIAGNOSIS — M1731 Unilateral post-traumatic osteoarthritis, right knee: Secondary | ICD-10-CM | POA: Diagnosis not present

## 2014-04-22 ENCOUNTER — Telehealth: Payer: Self-pay | Admitting: Internal Medicine

## 2014-04-22 NOTE — Telephone Encounter (Signed)
Patient already scheduled appt. For tomorrow.

## 2014-04-22 NOTE — Telephone Encounter (Signed)
We should see pt tomorrow to do cough rx if needed, can also use delsym otc prn to see if works ok

## 2014-04-22 NOTE — Telephone Encounter (Signed)
Pt called in requesting refill on HYDROcodone-acetaminophen (NORCO) 7.5-325 MG per tablet [[id number]] .  He said that he still is not feeling well.

## 2014-04-22 NOTE — Telephone Encounter (Signed)
Pt called back in and wanted to clarify that he was talking about calling in the cough meds not the pain meds.  She is coming tomorrow for a cute visit.  The below request was incorrect.

## 2014-04-22 NOTE — Telephone Encounter (Signed)
Very sorry, but pt should have in his possession hardcopy for pain med refills, with the next not due until feb 13  Please do not overuse the pain medication, and only take as prescribed  I cannot be responsible and will not be able to re-do the pain medication rx if lost

## 2014-04-23 ENCOUNTER — Ambulatory Visit (INDEPENDENT_AMBULATORY_CARE_PROVIDER_SITE_OTHER): Payer: Commercial Managed Care - HMO | Admitting: Internal Medicine

## 2014-04-23 ENCOUNTER — Encounter: Payer: Self-pay | Admitting: Internal Medicine

## 2014-04-23 VITALS — BP 130/92 | HR 62 | Temp 98.5°F | Ht 71.0 in | Wt 258.5 lb

## 2014-04-23 DIAGNOSIS — J069 Acute upper respiratory infection, unspecified: Secondary | ICD-10-CM | POA: Diagnosis not present

## 2014-04-23 DIAGNOSIS — N529 Male erectile dysfunction, unspecified: Secondary | ICD-10-CM

## 2014-04-23 DIAGNOSIS — I10 Essential (primary) hypertension: Secondary | ICD-10-CM | POA: Diagnosis not present

## 2014-04-23 DIAGNOSIS — R7302 Impaired glucose tolerance (oral): Secondary | ICD-10-CM

## 2014-04-23 MED ORDER — TADALAFIL 20 MG PO TABS
20.0000 mg | ORAL_TABLET | Freq: Every day | ORAL | Status: DC | PRN
Start: 2014-04-23 — End: 2014-07-16

## 2014-04-23 MED ORDER — LEVOFLOXACIN 500 MG PO TABS: 500.0000 mg | ORAL_TABLET | Freq: Every day | ORAL | Status: DC

## 2014-04-23 MED ORDER — HYDROCODONE-HOMATROPINE 5-1.5 MG/5ML PO SYRP: 5.0000 mL | ORAL_SOLUTION | Freq: Four times a day (QID) | ORAL | Status: DC | PRN

## 2014-04-23 NOTE — Patient Instructions (Addendum)
Please take all new medication as prescribed - the antibiotic, and cough medicine  You can also take Delsym OTC for cough, and/or Mucinex D(or it's generic off brand) for congestion, and tylenol as needed for pain.  Please take all new medication as prescribed: cialis trial  Please continue all other medications as before, and refills have been done if requested.  Please have the pharmacy call with any other refills you may need.  Please keep your appointments with your specialists as you may have planned

## 2014-04-23 NOTE — Progress Notes (Signed)
Pre visit review using our clinic review tool, if applicable. No additional management support is needed unless otherwise documented below in the visit note. 

## 2014-04-25 DIAGNOSIS — M1711 Unilateral primary osteoarthritis, right knee: Secondary | ICD-10-CM | POA: Diagnosis not present

## 2014-04-26 NOTE — Progress Notes (Signed)
Subjective:    Patient ID: Harold [name], male    DOB: [birth year], 49 y.o.   MRN: [id number]  HPI   Here with 2-3 days acute onset fever, facial pain, pressure, headache, general weakness and malaise, and greenish d/c, with mild ST and cough, but pt denies chest pain, wheezing, increased sob or doe, orthopnea, PND, increased LE swelling, palpitations, dizziness or syncope.  Also with ongoing ED symptoms, asks for ED med.  Pt denies new neurological symptoms such as new headache, or facial or extremity weakness or numbness   Pt denies polydipsia, polyuria, Past Medical History  Diagnosis Date  . Obesity   . Metabolic syndrome   . Hypertension   . Depression   . GERD (gastroesophageal reflux disease)   . Chronic back pain   . History of pancreatitis     a. admx 04-2009.Marland KitchenMarland Kitchen? 2-2 triglycerides  . Hypertriglyceridemia     a. followed by [facility]  . Acute pericarditis     admx 03-26-10 thru 03-29-10; a. echo 03-26-10: EF 55-60%; mild LVH; trivial MR; RVF ok; mild -mod circumferential Eff w/o tamponade  . Constrictive pericarditis   . OSA (obstructive sleep apnea)   . Gout     pt denies  . Family history of malignant neoplasm of gastrointestinal tract   . Allergic rhinitis, cause unspecified 09/14/2012  . DDD (degenerative disc disease), lumbar 09/14/2012  . Left sided sciatica 09/14/2012  . Erectile dysfunction 09/14/2012  . Hyperlipidemia 07/11/2013   Past Surgical History  Procedure Laterality Date  . Tonsillectomy and adenoidectomy      one tonsil  . Pericardectomy  07/15/2010    Dr. [name]  . Pleurx catheter placement  07/15/2010    Dr. [name]  . Shoulder surgery      right and left shoulders  . Knee surgery      right    reports that he has been smoking Cigarettes.  He has a 200 pack-year smoking history. He has never used smokeless tobacco. He reports that he drinks alcohol. He reports that he does not use illicit drugs. family history includes Colon cancer in his  father; Diabetes in his father, mother, and sister; Heart disease in his father; Hypertension in his other; Liver cancer in his paternal uncle; Pancreatitis in his father and paternal uncle; Stomach cancer in his maternal grandfather. There is no history of Esophageal cancer or Rectal cancer. Allergies  Allergen Reactions  . Colchicine     REACTION: diarrhea  . Diphenhydramine Hcl     REACTION: itchingm :jittery"  . Lipitor [Atorvastatin]     Muscle cramps  . Other Other (See Comments)    Pecans Reaction: itching and swelling of the tongue    Current Outpatient Prescriptions on File Prior to Visit  Medication Sig Dispense Refill  . amLODipine (NORVASC) 10 MG tablet TAKE ONE TABLET BY MOUTH DAILY 90 tablet 2  . aspirin 81 MG EC tablet Take 1 tablet (81 mg total) by mouth daily. Swallow whole. 30 tablet 12  . atorvastatin (LIPITOR) 20 MG tablet Take 1 tablet (20 mg total) by mouth daily. 90 tablet 3  . citalopram (CELEXA) 40 MG tablet TAKE 1 TABLET BY MOUTH DAILY 90 tablet 3  . clonazePAM (KLONOPIN) 0.5 MG tablet TAKE 1 TABLET BY MOUTH TWICE DAILY AS NEEDED FOR ANXIETY 60 tablet 1  . Diclofenac Sodium (PENNSAID) 2 % SOLN Place 2 application onto the skin 2 (two) times daily. 112 g 3  . fenofibrate (TRICOR) 145  MG tablet TAKE ONE TABLET BY MOUTH DAILY 90 tablet 3  . furosemide (LASIX) 20 MG tablet TAKE ONE TABLET BY MOUTH DAILY 90 tablet 3  . HYDROcodone-acetaminophen (NORCO) 7.5-325 MG per tablet Take 1 tablet by mouth every 6 (six) hours as needed. To fill May 10, 2014 120 tablet 0  . ketoconazole (NIZORAL) 2 % cream Apply 1 application topically daily. 30 g 1  . lisinopril (PRINIVIL,ZESTRIL) 20 MG tablet TAKE ONE TABLET BY MOUTH TWICE DAILY 180 tablet 3  . methocarbamol (ROBAXIN) 500 MG tablet   0  . potassium chloride (K-DUR) 10 MEQ tablet TAKE ONE TABLET BY MOUTH DAILY 90 tablet 3  . rosuvastatin (CRESTOR) 20 MG tablet Take 1 tablet (20 mg total) by mouth daily. 90 tablet 3  .  varenicline (CHANTIX CONTINUING MONTH PAK) 1 MG tablet Take 1 tablet (1 mg total) by mouth 2 (two) times daily. 60 tablet 1  . [DISCONTINUED] amoxicillin-clarithromycin-lansoprazole (PREVPAC) combo pack Take by mouth 2 (two) times daily. Follow package directions. 1 kit 0   No current facility-administered medications on file prior to visit.     Review of Systems  Constitutional: Negative for unusual diaphoresis or other sweats  HENT: Negative for ringing in ear Eyes: Negative for double vision or worsening visual disturbance.  Respiratory: Negative for choking and stridor.   Gastrointestinal: Negative for vomiting or other signifcant bowel change Genitourinary: Negative for hematuria or decreased urine volume.  Musculoskeletal: Negative for other MSK pain or swelling Skin: Negative for color change and worsening wound.  Neurological: Negative for tremors and numbness other than noted  Psychiatric/Behavioral: Negative for decreased concentration or agitation other than above       Objective:   Physical Exam BP 130/92 mmHg  Pulse 62  Temp(Src) 98.5 F (36.9 C) (Oral)  Ht _0  (1.803 m)  Wt 258 lb 8 oz (117.255 kg)  BMI 36.07 kg/m2  SpO2 96% VS noted, mild ill Constitutional: Pt appears well-developed, well-nourished.  HENT: Head: NCAT.  Right Ear: External ear normal.  Left Ear: External ear normal.  Eyes: . Pupils are equal, round, and reactive to light. Conjunctivae and EOM are normal Bilat tm's with mild erythema.  Max sinus areas mild tender.  Pharynx with mild erythema, no exudate Neck: Normal range of motion. Neck supple.  Cardiovascular: Normal rate and regular rhythm.   Pulmonary/Chest: Effort normal and breath sounds without rales or wheezing.  Neurological: Pt is alert. Not confused , motor grossly intact Skin: Skin is warm. No rash Psychiatric: Pt behavior is normal. No agitation.     Assessment & Plan:

## 2014-04-26 NOTE — Assessment & Plan Note (Signed)
Mild to mod, for antibx course,  to f/u any worsening symptoms or concerns 

## 2014-04-26 NOTE — Assessment & Plan Note (Signed)
Mignon for trial of cialis prn,  to f/u any worsening symptoms or concerns

## 2014-04-26 NOTE — Assessment & Plan Note (Signed)
stable overall by history and exam, recent data reviewed with pt, and pt to continue medical treatment as before,  to f/u any worsening symptoms or concerns' Lab Results  Component Value Date   HGBA1C 6.5 11/19/2013

## 2014-04-26 NOTE — Assessment & Plan Note (Signed)
stable overall by history and exam, recent data reviewed with pt, and pt to continue medical treatment as before,  to f/u any worsening symptoms or concerns BP Readings from Last 3 Encounters:  04/23/14 130/92  04/10/14 144/92  02/28/14 140/76

## 2014-05-02 DIAGNOSIS — M1711 Unilateral primary osteoarthritis, right knee: Secondary | ICD-10-CM | POA: Diagnosis not present

## 2014-05-09 DIAGNOSIS — M1711 Unilateral primary osteoarthritis, right knee: Secondary | ICD-10-CM | POA: Diagnosis not present

## 2014-05-18 ENCOUNTER — Emergency Department (INDEPENDENT_AMBULATORY_CARE_PROVIDER_SITE_OTHER)
Admission: EM | Admit: 2014-05-18 | Discharge: 2014-05-18 | Disposition: A | Discharge status: Home / Self Care | Payer: Commercial Managed Care - HMO | Source: Home / Self Care | Attending: Family Medicine | Admitting: Family Medicine

## 2014-05-18 ENCOUNTER — Encounter (HOSPITAL_COMMUNITY): Payer: Self-pay | Admitting: Emergency Medicine

## 2014-05-18 DIAGNOSIS — M25551 Pain in right hip: Secondary | ICD-10-CM | POA: Diagnosis not present

## 2014-05-18 DIAGNOSIS — M545 Low back pain, unspecified: Secondary | ICD-10-CM

## 2014-05-18 LAB — POCT URINALYSIS DIP (DEVICE)
Bilirubin Urine: NEGATIVE
Glucose, UA: NEGATIVE mg/dL
Ketones, ur: NEGATIVE mg/dL
Leukocytes, UA: NEGATIVE
Nitrite: NEGATIVE
Protein, ur: NEGATIVE mg/dL
Specific Gravity, Urine: 1.01 (ref 1.005–1.030)
Urobilinogen, UA: 0.2 mg/dL (ref 0.0–1.0)
pH: 5 (ref 5.0–8.0)

## 2014-05-18 MED ORDER — CYCLOBENZAPRINE HCL 10 MG PO TABS: 10.0000 mg | ORAL_TABLET | Freq: Two times a day (BID) | ORAL | Status: DC | PRN

## 2014-05-18 MED ORDER — KETOROLAC TROMETHAMINE 60 MG/2ML IM SOLN
INTRAMUSCULAR | Status: AC
  Filled 2014-05-18: qty 2

## 2014-05-18 MED ORDER — METHYLPREDNISOLONE ACETATE 40 MG/ML IJ SUSP
80.0000 mg | Freq: Once | INTRAMUSCULAR | Status: AC
  Administered 2014-05-18: 80 mg via INTRAMUSCULAR

## 2014-05-18 MED ORDER — KETOROLAC TROMETHAMINE 60 MG/2ML IM SOLN
60.0000 mg | Freq: Once | INTRAMUSCULAR | Status: AC
  Administered 2014-05-18: 60 mg via INTRAMUSCULAR

## 2014-05-18 MED ORDER — METHYLPREDNISOLONE ACETATE 40 MG/ML IJ SUSP
INTRAMUSCULAR | Status: AC
  Filled 2014-05-18: qty 2

## 2014-05-18 NOTE — ED Provider Notes (Addendum)
CSN: [id number]     Arrival date & time 05/18/14  1455 History   First MD Initiated Contact with Patient 05/18/14 1542     Chief Complaint  Patient presents with  . Abdominal Pain  . 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 He notes that he had viral gastroenteritis for 24 hours earlier this week and the pain in his hip started after that. Is worse when rising from a seated position. He has been taking his daily metabolic without relief of his pain. No loss of bowel or bladder control. No extremity numbness or weakness. No groin pain or testicular pain  Past Medical History  Diagnosis Date  . Obesity   . Metabolic syndrome   . Hypertension   . Depression   . GERD (gastroesophageal reflux disease)   . Chronic back pain   . History of pancreatitis     a. admx 04-2009.Marland KitchenMarland Kitchen? 2-2 triglycerides  . Hypertriglyceridemia     a. followed by [facility]  . Acute pericarditis     admx 03-26-10 thru 03-29-10; a. echo 03-26-10: EF 55-60%; mild LVH; trivial MR; RVF ok; mild -mod circumferential Eff w/o tamponade  . Constrictive pericarditis   . OSA (obstructive sleep apnea)   . Gout     pt denies  . Family history of malignant neoplasm of gastrointestinal tract   . Allergic rhinitis, cause unspecified 09/14/2012  . DDD (degenerative disc disease), lumbar 09/14/2012  . Left sided sciatica 09/14/2012  . Erectile dysfunction 09/14/2012  . Hyperlipidemia 07/11/2013   Past Surgical History  Procedure Laterality Date  . Tonsillectomy and adenoidectomy      one tonsil  . Pericardectomy  07/15/2010    Dr. [name]  . Pleurx catheter placement  07/15/2010    Dr. [name]  . Shoulder surgery      right and left shoulders  . Knee surgery      right   Family History   Problem Relation Age of Onset  . Diabetes Father   . Colon cancer Father   . Heart disease Father   . Pancreatitis Father   . Hypertension Other     entire family  . Diabetes Mother   . Liver cancer Paternal Uncle   . Diabetes Sister   . Stomach cancer Maternal Grandfather   . Pancreatitis Paternal Uncle     x 2  . Esophageal cancer Neg Hx   . Rectal cancer Neg Hx    History  Substance Use Topics  . Smoking status: Current Every Day Smoker -- 0.50 packs/day for 20 years    Types: Cigarettes  . Smokeless tobacco: Never Used  . Alcohol Use: 0.0 oz/week     Comment: several times a week and mostly on the weekends    Review of Systems  Musculoskeletal: Positive for back pain.       Back pain and groin pain on the right  All other systems reviewed and are negative.   Allergies  Colchicine; Diphenhydramine hcl; Lipitor; and Other  Home Medications   Prior to Admission medications   Medication Sig Start Date End Date Taking? Authorizing Provider  amLODipine (NORVASC) 10 MG tablet TAKE ONE TABLET BY MOUTH DAILY 04/04/14  Yes Biagio Borg, MD  aspirin 81 MG EC tablet Take 1 tablet (81 mg total) by  mouth daily. Swallow whole. 09/14/12  Yes Biagio Borg, MD  atorvastatin (LIPITOR) 20 MG tablet Take 1 tablet (20 mg total) by mouth daily. 07/11/13 07/11/14 Yes Biagio Borg, MD  citalopram (CELEXA) 40 MG tablet TAKE 1 TABLET BY MOUTH DAILY   Yes Biagio Borg, MD  furosemide (LASIX) 20 MG tablet TAKE ONE TABLET BY MOUTH DAILY   Yes Biagio Borg, MD  lisinopril (PRINIVIL,ZESTRIL) 20 MG tablet TAKE ONE TABLET BY MOUTH TWICE DAILY 12/03/13  Yes Biagio Borg, MD  potassium chloride (K-DUR) 10 MEQ tablet TAKE ONE TABLET BY MOUTH DAILY 10/16/13  Yes Biagio Borg, MD  rosuvastatin (CRESTOR) 20 MG tablet Take 1 tablet (20 mg total) by mouth daily. 10/04/13  Yes Biagio Borg, MD  clonazePAM (KLONOPIN) 0.5 MG tablet TAKE 1 TABLET BY MOUTH TWICE DAILY AS NEEDED FOR ANXIETY 12/20/13   Biagio Borg, MD   cyclobenzaprine (FLEXERIL) 10 MG tablet Take 1 tablet (10 mg total) by mouth 2 (two) times daily as needed for muscle spasms. 05/18/14   Liam Graham, PA-C  Diclofenac Sodium (PENNSAID) 2 % SOLN Place 2 application onto the skin 2 (two) times daily. 09/04/13   Lyndal Pulley, DO  fenofibrate (TRICOR) 145 MG tablet TAKE ONE TABLET BY MOUTH DAILY 10/16/13   Biagio Borg, MD  HYDROcodone-acetaminophen [facility]) 7.5-325 MG per tablet Take 1 tablet by mouth every 6 (six) hours as needed. To fill May 10, 2014 04/10/14   Biagio Borg, MD  HYDROcodone-homatropine [facility]) 5-1.5 MG/5ML syrup Take 5 mLs by mouth every 6 (six) hours as needed for cough. 04/23/14   Biagio Borg, MD  ketoconazole (NIZORAL) 2 % cream Apply 1 application topically daily. 02/28/14   Biagio Borg, MD  levofloxacin (LEVAQUIN) 500 MG tablet Take 1 tablet (500 mg total) by mouth daily. 04/23/14   Biagio Borg, MD  methocarbamol (ROBAXIN) 500 MG tablet  02/14/14   Historical Provider, MD  tadalafil (CIALIS) 20 MG tablet Take 1 tablet (20 mg total) by mouth daily as needed for erectile dysfunction. 04/23/14   Biagio Borg, MD  varenicline (CHANTIX CONTINUING MONTH PAK) 1 MG tablet Take 1 tablet (1 mg total) by mouth 2 (two) times daily. 04/10/14   Biagio Borg, MD   BP 162/94 mmHg  Pulse 71  Temp(Src) 98.2 F (36.8 C) (Oral)  Resp 18  SpO2 100% Physical Exam  Constitutional: He is oriented to person, place, and time. He appears well-developed and well-nourished. No distress.  HENT:  Head: Normocephalic.  Pulmonary/Chest: Effort normal. No respiratory distress.  Musculoskeletal:       Right hip: He exhibits tenderness (Minimal tenderness in the right groin). He exhibits no crepitus and no deformity.       Lumbar back: He exhibits tenderness (right-sided paraspinous musculature), pain and spasm. He exhibits no bony tenderness and no deformity.  Neurological: He is alert and oriented to person, place, and time. Coordination normal.   Skin: Skin is warm and dry. No rash noted. He is not diaphoretic.  Psychiatric: He has a normal mood and affect. Judgment normal.  Nursing note and vitals reviewed.   ED Course  Procedures (including critical care time) Labs Review Labs Reviewed  POCT URINALYSIS DIP (DEVICE) - Abnormal; Notable for the following:    Hgb urine dipstick TRACE (*)    All other components within normal limits    Imaging Review No results found.   MDM   1.  Midline low back pain without sciatica   2. Right hip pain    No red flags. I reviewed the controlled substances database in this patient is getting chronic narcotics from multiple different providers, I will not prescribe him any narcotic medications. I will give him toradol and depomedrol here and Rx for some Flexeril for pain and he can follow-up with his regular doctor  Meds ordered this encounter  Medications  . ketorolac (TORADOL) injection 60 mg    Sig:   . methylPREDNISolone acetate (DEPO-MEDROL) injection 80 mg    Sig:   . cyclobenzaprine (FLEXERIL) 10 MG tablet    Sig: Take 1 tablet (10 mg total) by mouth 2 (two) times daily as needed for muscle spasms.    Dispense:  20 tablet    Refill:  0    Liam Graham, PA-C 05/18/14 [place] Svetlana Bagby, PA-C 05/20/14 1035

## 2014-05-18 NOTE — ED Notes (Signed)
Pt states that he has had right sided back pain and abdominal pain for 3 days.

## 2014-05-18 NOTE — Discharge Instructions (Signed)
Back Pain, Adult Low back pain is very common. About 1 in 5 people have back pain.The cause of low back pain is rarely dangerous. The pain often gets better over time.About half of people with a sudden onset of back pain feel better in just 2 weeks. About 8 in 10 people feel better by 6 weeks.  CAUSES Some common causes of back pain include:  Strain of the muscles or ligaments supporting the spine.  Wear and tear (degeneration) of the spinal discs.  Arthritis.  Direct injury to the back. DIAGNOSIS Most of the time, the direct cause of low back pain is not known.However, back pain can be treated effectively even when the exact cause of the pain is unknown.Answering your caregiver's questions about your overall health and symptoms is one of the most accurate ways to make sure the cause of your pain is not dangerous. If your caregiver needs more information, he or she may order lab work or imaging tests (X-rays or MRIs).However, even if imaging tests show changes in your back, this usually does not require surgery. HOME CARE INSTRUCTIONS For many people, back pain returns.Since low back pain is rarely dangerous, it is often a condition that people can learn to manageon their own.   Remain active. It is stressful on the back to sit or stand in one place. Do not sit, drive, or stand in one place for more than 30 minutes at a time. Take short walks on level surfaces as soon as pain allows.Try to increase the length of time you walk each day.  Do not stay in bed.Resting more than 1 or 2 days can delay your recovery.  Do not avoid exercise or work.Your body is made to move.It is not dangerous to be active, even though your back may hurt.Your back will likely heal faster if you return to being active before your pain is gone.  Pay attention to your body when you bend and lift. Many people have less discomfortwhen lifting if they bend their knees, keep the load close to their bodies,and  avoid twisting. Often, the most comfortable positions are those that put less stress on your recovering back.  Find a comfortable position to sleep. Use a firm mattress and lie on your side with your knees slightly bent. If you lie on your back, put a pillow under your knees.  Only take over-the-counter or prescription medicines as directed by your caregiver. Over-the-counter medicines to reduce pain and inflammation are often the most helpful.Your caregiver may prescribe muscle relaxant drugs.These medicines help dull your pain so you can more quickly return to your normal activities and healthy exercise.  Put ice on the injured area.  Put ice in a plastic bag.  Place a towel between your skin and the bag.  Leave the ice on for 15-20 minutes, 03-04 times a day for the first 2 to 3 days. After that, ice and heat may be alternated to reduce pain and spasms.  Ask your caregiver about trying back exercises and gentle massage. This may be of some benefit.  Avoid feeling anxious or stressed.Stress increases muscle tension and can worsen back pain.It is important to recognize when you are anxious or stressed and learn ways to manage it.Exercise is a great option. SEEK MEDICAL CARE IF:  You have pain that is not relieved with rest or medicine.  You have pain that does not improve in 1 week.  You have new symptoms.  You are generally not feeling well. SEEK   IMMEDIATE MEDICAL CARE IF:   You have pain that radiates from your back into your legs.  You develop new bowel or bladder control problems.  You have unusual weakness or numbness in your arms or legs.  You develop nausea or vomiting.  You develop abdominal pain.  You feel faint. Document Released: 03/14/2005 Document Revised: 09/13/2011 Document Reviewed: 07/16/2013 Exit[employer] Patient Information 2015 [employer]. This information is not intended to replace advice given to you by your health care provider. Make sure you  discuss any questions you have with your health care provider. Hip Pain Your hip is the joint between your upper legs and your lower pelvis. The bones, cartilage, tendons, and muscles of your hip joint perform a lot of work each day supporting your body weight and allowing you to move around. Hip pain can range from a minor ache to severe pain in one or both of your hips. Pain may be felt on the inside of the hip joint near the groin, or the outside near the buttocks and upper thigh. You may have swelling or stiffness as well.  HOME CARE INSTRUCTIONS   Take medicines only as directed by your health care provider.  Apply ice to the injured area:  Put ice in a plastic bag.  Place a towel between your skin and the bag.  Leave the ice on for 15-20 minutes at a time, 3-4 times a day.  Keep your leg raised (elevated) when possible to lessen swelling.  Avoid activities that cause pain.  Follow specific exercises as directed by your health care provider.  Sleep with a pillow between your legs on your most comfortable side.  Record how often you have hip pain, the location of the pain, and what it feels like. SEEK MEDICAL CARE IF:   You are unable to put weight on your leg.  Your hip is red or swollen or very tender to touch.  Your pain or swelling continues or worsens after 1 week.  You have increasing difficulty walking.  You have a fever. SEEK IMMEDIATE MEDICAL CARE IF:   You have fallen.  You have a sudden increase in pain and swelling in your hip. MAKE SURE YOU:   Understand these instructions.  Will watch your condition.  Will get help right away if you are not doing well or get worse. Document Released: 09/01/2009 Document Revised: 07/29/2013 Document Reviewed: 11/08/2012 Exit[employer] Patient Information 2015 [employer]. This information is not intended to replace advice given to you by your health care provider. Make sure you discuss any questions you have with your  health care provider.  

## 2014-05-20 ENCOUNTER — Encounter: Payer: Self-pay | Admitting: Internal Medicine

## 2014-05-20 ENCOUNTER — Ambulatory Visit (INDEPENDENT_AMBULATORY_CARE_PROVIDER_SITE_OTHER): Payer: Commercial Managed Care - HMO | Admitting: Internal Medicine

## 2014-05-20 VITALS — BP 158/94 | HR 67 | Temp 98.1°F | Ht 71.0 in | Wt 256.8 lb

## 2014-05-20 DIAGNOSIS — M5136 Other intervertebral disc degeneration, lumbar region: Secondary | ICD-10-CM | POA: Diagnosis not present

## 2014-05-20 DIAGNOSIS — M1711 Unilateral primary osteoarthritis, right knee: Secondary | ICD-10-CM

## 2014-05-20 DIAGNOSIS — M5416 Radiculopathy, lumbar region: Secondary | ICD-10-CM | POA: Diagnosis not present

## 2014-05-20 MED ORDER — GABAPENTIN 100 MG PO CAPS: ORAL_CAPSULE | ORAL | Status: DC

## 2014-05-20 NOTE — Patient Instructions (Signed)
Assess response to the gabapentin one every 8 hours as needed. If it is partially beneficial, it can be increased up to a total of 3 pills every 8 hours as needed. This increase of 1 pill each dose  should take place over 72 hours at least.If 300 mg is effective dose ; there is a 300 mg pill. 

## 2014-05-20 NOTE — Progress Notes (Signed)
   Subjective:    Patient ID: Harold [name], male    DOB: 03-30-1965, 49 y.o.   MRN: [id number]  HPI Since 05/14/14 he has had R LS area pain which radiates into the right inguinal area as well as anterior thigh on the right. He describes it as up to level X. It is described as a "pulled muscle which is aching". He denies associated numbness, tingling, or bowel or bladder incontinence.  A muscle relaxant methocarbamol has provided some relief as has hydrocodone.  His most recent 05/18/14 ED visit was reviewed. That describes mid low back pain without sciatica associated with some right hip pain. Toradol, Depo-Medrol as well as Flexeril prescribed.  He has been using a single crutch because of the pain.  Past history includes knee surgery with meniscus tear repair 11/15. Apparently there was some secondary infection which required debridement. ER record mentions lumbar DDD; no imaging in our EMR of LS spine.  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  He denies a surgery or injury to his back. He has had epidural steroid injections on the left apparently.  Review of Systems He denies fever, chills, or sweats.  He has some questionable right lower extremity weakness due to the pain.  Last week he had self limited gastroenteritis with nausea & vomiting 3-5 times.    Objective:   Physical Exam  Pertinent or  positive findings include: He does ambulate using a single crutch on the right. He tends to swing the right leg out. He is wearing a soft brace on the right knee. He is able to lie flat without help. When he lay flat he leaped back up because of pain. With straight leg raising he described discomfort in the right lumbosacral area. There was no radiation below that level. DTR decreased R knee.  General appearance :adequately nourished;clinically uncomfortable but in no  distress. Eyes: No conjunctival inflammation;slight scleral vascular prominence. No icterus is present. Heart:  Normal rate and regular rhythm. S1 and S2 normal without gallop, murmur, click, rub or other extra sounds  Lungs:Chest clear to auscultation; no wheezes, rhonchi,rales ,or rubs present.No increased work of breathing.  Abdomen: Protuberant;bowel sounds normal, soft and non-tender without masses, organomegaly or hernias noted.  No guarding or rebound. No flank tenderness to percussion. Vascular : all pulses equal ; no bruits present. Skin:Warm & dry.  Intact without suspicious lesions or rashes ; no tenting Lymphatic: No lymphadenopathy is noted about the head, neck, axilla Neuro: Strength, tone normal.         Assessment & Plan:  #1 lumbar radiculopathy at L1-3 level on the right.  Plan: The pathophysiology of lumbar radiculopathy was discussed. A trial of gabapentin was recommended. He has requested re-evaluation by Dr Charlann Boxer

## 2014-05-20 NOTE — Progress Notes (Signed)
Pre visit review using our clinic review tool, if applicable. No additional management support is needed unless otherwise documented below in the visit note. 

## 2014-05-22 ENCOUNTER — Other Ambulatory Visit: Payer: Self-pay | Admitting: Internal Medicine

## 2014-05-22 ENCOUNTER — Encounter: Payer: Self-pay | Admitting: Internal Medicine

## 2014-05-22 ENCOUNTER — Ambulatory Visit (INDEPENDENT_AMBULATORY_CARE_PROVIDER_SITE_OTHER): Payer: Commercial Managed Care - HMO | Admitting: Internal Medicine

## 2014-05-22 VITALS — BP 150/92 | HR 66 | Temp 98.0°F | Resp 18 | Ht 71.0 in | Wt 256.0 lb

## 2014-05-22 DIAGNOSIS — R7302 Impaired glucose tolerance (oral): Secondary | ICD-10-CM

## 2014-05-22 DIAGNOSIS — M549 Dorsalgia, unspecified: Secondary | ICD-10-CM

## 2014-05-22 DIAGNOSIS — I10 Essential (primary) hypertension: Secondary | ICD-10-CM

## 2014-05-22 DIAGNOSIS — J3089 Other allergic rhinitis: Secondary | ICD-10-CM

## 2014-05-22 DIAGNOSIS — G8929 Other chronic pain: Secondary | ICD-10-CM | POA: Diagnosis not present

## 2014-05-22 MED ORDER — PREDNISONE 10 MG PO TABS: 10.0000 mg | ORAL_TABLET | Freq: Every day | ORAL | Status: DC

## 2014-05-22 MED ORDER — CETIRIZINE HCL 10 MG PO TABS: 10.0000 mg | ORAL_TABLET | Freq: Every day | ORAL | Status: DC

## 2014-05-22 MED ORDER — GABAPENTIN 300 MG PO CAPS: 300.0000 mg | ORAL_CAPSULE | Freq: Three times a day (TID) | ORAL | Status: DC

## 2014-05-22 MED ORDER — METHYLPREDNISOLONE ACETATE 80 MG/ML IJ SUSP
80.0000 mg | Freq: Once | INTRAMUSCULAR | Status: AC
  Administered 2014-05-22: 80 mg via INTRAMUSCULAR

## 2014-05-22 MED ORDER — FLUTICASONE PROPIONATE 50 MCG/ACT NA SUSP: 2.0000 | Freq: Every day | NASAL | Status: DC

## 2014-05-22 NOTE — Assessment & Plan Note (Signed)
Etiology unclear, suspect underlying lumbar djd and /or DDD with possible radiculitis, will check films, also predpac asd, also increcrease gabapentin 300 tid prn, watch for sedation,  to f/u any worsening symptoms or concerns

## 2014-05-22 NOTE — Patient Instructions (Signed)
You had the steroid shot today  Please take all new medication as prescribed - the zyrtec and flonase, as well as the prednisone  OK to increase the gabapentin to 300 mg three times per day  Please continue all other medications as before, and refills have been done if requested.  Please have the pharmacy call with any other refills you may need.  Please keep your appointments with your specialists as you may have planned  Please return in 6 months, or sooner if needed, with Lab testing done 3-5 days before

## 2014-05-22 NOTE — Assessment & Plan Note (Signed)
stable overall by history and exam, recent data reviewed with pt, and pt to continue medical treatment as before,  to f/u any worsening symptoms or concerns Lab Results  Component Value Date   HGBA1C 6.5 11/19/2013   Watch for elev sugars with steroid tx, to call for cbg > 200

## 2014-05-22 NOTE — Assessment & Plan Note (Signed)
Mod flare - for depomedrol IM, also has predpac as above, also for zyrtec/flonase asd for longer term control

## 2014-05-22 NOTE — Progress Notes (Signed)
Pre visit review using our clinic review tool, if applicable. No additional management support is needed unless otherwise documented below in the visit note. 

## 2014-05-22 NOTE — Assessment & Plan Note (Signed)
stable overall by history and exam, recent data reviewed with pt, and pt to continue medical treatment as before,  to f/u any worsening symptoms or concerns BP Readings from Last 3 Encounters:  05/22/14 150/92  05/20/14 158/94  05/18/14 162/94

## 2014-05-22 NOTE — Progress Notes (Signed)
Subjective:    Patient ID: Harold [name], male    DOB: 07-02-1965, 49 y.o.   MRN: [id number]  HPI  Here to f/u, Pt continues to have recurring right LBP 1 wk severe, dull and sharp without change in severity but radiaating to right groin and prox thigh (post and ant) - still severe since yesterday, but no bowel or bladder change, fever, wt loss,  worsening LE pain/numbness/weakness, gait change or falls.Has been walking with crutches on his own as this seems to help somehwatt.  Standing up, lifting the right leg while sitting and walking can aggrevated the pain. THis is all on top of recurring usually left sided pain. Which is not acting up today  Nothing seems to help so far, including the gabapentin 200 per Dr Linna Darner yesterday.  Does still have robaxin and hydrocodone for prn use at home.  Does have several wks ongoing nasal allergy symptoms with clearish congestion, itch and sneezing, without fever, pain, ST, cough, swelling or wheezing, no w"out of control."  No recent films.  No prior lumbar surgury Past Medical History  Diagnosis Date  . Obesity   . Metabolic syndrome   . Hypertension   . Depression   . GERD (gastroesophageal reflux disease)   . Chronic back pain   . History of pancreatitis     a. admx 04-2009.Marland KitchenMarland Kitchen? 2-2 triglycerides  . Hypertriglyceridemia     a. followed by [facility]  . Acute pericarditis     admx 03-26-10 thru 03-29-10; a. echo 03-26-10: EF 55-60%; mild LVH; trivial MR; RVF ok; mild -mod circumferential Eff w/o tamponade  . Constrictive pericarditis   . OSA (obstructive sleep apnea)   . Gout     pt denies  . Family history of malignant neoplasm of gastrointestinal tract   . Allergic rhinitis, cause unspecified 09/14/2012  . DDD (degenerative disc disease), lumbar 09/14/2012  . Left sided sciatica 09/14/2012  . Erectile dysfunction 09/14/2012  . Hyperlipidemia 07/11/2013   Past Surgical History  Procedure Laterality Date  . Tonsillectomy and adenoidectomy      one tonsil  . Pericardectomy  07/15/2010    Dr. [name]  . Pleurx catheter placement  07/15/2010    Dr. [name]  . Shoulder surgery      right and left shoulders  . Knee surgery      right    reports that he has been smoking Cigarettes.  He has a 10 pack-year smoking history. He has never used smokeless tobacco. He reports that he drinks alcohol. He reports that he does not use illicit drugs. family history includes Colon cancer in his father; Diabetes in his father, mother, and sister; Heart disease in his father; Hypertension in his other; Liver cancer in his paternal uncle; Pancreatitis in his father and paternal uncle; Stomach cancer in his maternal grandfather. There is no history of Esophageal cancer or Rectal cancer. Allergies  Allergen Reactions  . Colchicine     REACTION: diarrhea  . Diphenhydramine Hcl     REACTION: itchingm :jittery"  . Lipitor [Atorvastatin]     Muscle cramps  . Other Other (See Comments)    Pecans Reaction: itching and swelling of the tongue    Current Outpatient Prescriptions on File Prior to Visit  Medication Sig Dispense Refill  . amLODipine (NORVASC) 10 MG tablet TAKE ONE TABLET BY MOUTH DAILY 90 tablet 2  . aspirin 81 MG EC tablet Take 1 tablet (81 mg total) by mouth daily. Swallow whole. [place]  tablet 12  . atorvastatin (LIPITOR) 20 MG tablet Take 1 tablet (20 mg total) by mouth daily. 90 tablet 3  . citalopram (CELEXA) 40 MG tablet TAKE 1 TABLET BY MOUTH DAILY 90 tablet 3  . clonazePAM (KLONOPIN) 0.5 MG tablet TAKE 1 TABLET BY MOUTH TWICE DAILY AS NEEDED FOR ANXIETY 60 tablet 1  . cyclobenzaprine (FLEXERIL) 10 MG tablet Take 1 tablet (10 mg total) by mouth 2 (two) times daily as needed for muscle spasms. 20 tablet 0  . Diclofenac Sodium (PENNSAID) 2 % SOLN Place 2 application onto the skin 2 (two) times daily. 112 g 3  . fenofibrate (TRICOR) 145 MG tablet TAKE ONE TABLET BY MOUTH DAILY 90 tablet 3  . furosemide (LASIX) 20 MG tablet TAKE ONE TABLET BY  MOUTH DAILY 90 tablet 3  . gabapentin (NEURONTIN) 100 MG capsule One pill every eight hours as needed; dose may be increased by one pill each dose after 72 hours if only partially effective 30 capsule 2  . HYDROcodone-acetaminophen (NORCO) 7.5-325 MG per tablet Take 1 tablet by mouth every 6 (six) hours as needed. To fill May 10, 2014 120 tablet 0  . HYDROcodone-homatropine (HYCODAN) 5-1.5 MG/5ML syrup Take 5 mLs by mouth every 6 (six) hours as needed for cough. 180 mL 0  . ketoconazole (NIZORAL) 2 % cream Apply 1 application topically daily. 30 g 1  . levofloxacin (LEVAQUIN) 500 MG tablet Take 1 tablet (500 mg total) by mouth daily. 10 tablet 0  . lisinopril (PRINIVIL,ZESTRIL) 20 MG tablet TAKE ONE TABLET BY MOUTH TWICE DAILY 180 tablet 3  . methocarbamol (ROBAXIN) 500 MG tablet   0  . potassium chloride (K-DUR) 10 MEQ tablet TAKE ONE TABLET BY MOUTH DAILY 90 tablet 3  . rosuvastatin (CRESTOR) 20 MG tablet Take 1 tablet (20 mg total) by mouth daily. 90 tablet 3  . tadalafil (CIALIS) 20 MG tablet Take 1 tablet (20 mg total) by mouth daily as needed for erectile dysfunction. 3 tablet 0  . varenicline (CHANTIX CONTINUING MONTH PAK) 1 MG tablet Take 1 tablet (1 mg total) by mouth 2 (two) times daily. 60 tablet 1  . [DISCONTINUED] amoxicillin-clarithromycin-lansoprazole (PREVPAC) combo pack Take by mouth 2 (two) times daily. Follow package directions. 1 kit 0   No current facility-administered medications on file prior to visit.   Review of Systems  Constitutional: Negative for unusual diaphoresis or other sweats  HENT: Negative for ringing in ear Eyes: Negative for double vision or worsening visual disturbance.  Respiratory: Negative for choking and stridor.   Gastrointestinal: Negative for vomiting or other signifcant bowel change Genitourinary: Negative for hematuria or decreased urine volume.  Musculoskeletal: Negative for other MSK pain or swelling Skin: Negative for color change and  worsening wound.  Neurological: Negative for tremors and numbness other than noted  Psychiatric/Behavioral: Negative for decreased concentration or agitation other than above       Objective:   Physical Exam BP 150/92 mmHg  Pulse 66  Temp(Src) 98 F (36.7 C) (Oral)  Resp 18  Ht _0  (1.803 m)  Wt 256 lb (116.121 kg)  BMI 35.72 kg/m2  SpO2 97% VS noted,  Constitutional: Pt appears well-developed, well-nourished.  HENT: Head: NCAT.  Right Ear: External ear normal.  Left Ear: External ear normal.  Eyes: . Pupils are equal, round, and reactive to light. Conjunctivae and EOM are normal Neck: Normal range of motion. Neck supple.  Cardiovascular: Normal rate and regular rhythm.   Pulmonary/Chest: Effort normal and  breath sounds without rales or wheezing.  Abd:  Soft, NT, ND, + BS Neurological: Pt is alert. Not confused , motor grossly intact, sens/dtr intact Spine nontender, does have left upper lumbar area paravertebral muscle tenderspasm, also tender right lower lumbar paravertebral area without swelling/spasm, redness or rash Skin: Skin is warm. No rash Psychiatric: Pt behavior is normal. No agitation.      Assessment & Plan:

## 2014-05-23 ENCOUNTER — Ambulatory Visit (INDEPENDENT_AMBULATORY_CARE_PROVIDER_SITE_OTHER)
Admission: RE | Admit: 2014-05-23 | Discharge: 2014-05-23 | Disposition: A | Discharge status: Home / Self Care | Payer: Commercial Managed Care - HMO | Source: Ambulatory Visit | Attending: Family Medicine | Admitting: Family Medicine

## 2014-05-23 ENCOUNTER — Ambulatory Visit (INDEPENDENT_AMBULATORY_CARE_PROVIDER_SITE_OTHER): Payer: Commercial Managed Care - HMO | Admitting: Family Medicine

## 2014-05-23 ENCOUNTER — Encounter: Payer: Self-pay | Admitting: Family Medicine

## 2014-05-23 VITALS — BP 140/78 | HR 78 | Ht 71.0 in | Wt 256.0 lb

## 2014-05-23 DIAGNOSIS — M5416 Radiculopathy, lumbar region: Secondary | ICD-10-CM | POA: Diagnosis not present

## 2014-05-23 DIAGNOSIS — M545 Low back pain, unspecified: Secondary | ICD-10-CM

## 2014-05-23 DIAGNOSIS — S79911A Unspecified injury of right hip, initial encounter: Secondary | ICD-10-CM | POA: Diagnosis not present

## 2014-05-23 DIAGNOSIS — M25551 Pain in right hip: Secondary | ICD-10-CM | POA: Diagnosis not present

## 2014-05-23 MED ORDER — KETOROLAC TROMETHAMINE 60 MG/2ML IM SOLN
60.0000 mg | Freq: Once | INTRAMUSCULAR | Status: AC
  Administered 2014-05-23: 60 mg via INTRAMUSCULAR

## 2014-05-23 NOTE — Assessment & Plan Note (Signed)
I believe the patient is having more of a lumbar radiculopathy and it is right-sided. Her age and status description of the pain is likely L2-L3. Patient is responding the pain medication, anti-inflammatories, icing protocol or home exercises. Patient was put in compression today to see if this will be beneficial. X-rays the patient's back and hip ordered today. Differential includes acute hernia due to the intra-abdominal increase in pressure but unfortunately nothing was found today. Patient is also not having any more stomach complaints or any change in bowel habits. Patient has had no fevers or chills or any abnormal weight loss. Discussed with patient that because of the unrelenting pain and unable to do a proper exam with strength do feel that further imaging is necessary in an urgent MRI is ordered to rule out any significant bone or nerve compression that could be causing this. Patient will continue with the prednisone Dosepak that he was given as well as the other pain medications. Patient was given a Toradol shot today to try to help. Patient and will come back and see me again after the MRI or we will call him to discuss further treatment options. he knows that if any worsening pain though he will go to emergency department immediately  Spent  25 minutes with patient face-to-face and had greater than 50% of counseling including as described above in assessment and plan.

## 2014-05-23 NOTE — Progress Notes (Signed)
Pre visit review using our clinic review tool, if applicable. No additional management support is needed unless otherwise documented below in the visit note. 

## 2014-05-23 NOTE — Patient Instructions (Addendum)
Good to see you I agree with the prednisone and the gabapentin We do need xrays of your back today  MRi ordered today.,  We will try an injection to help you feel better  Ice not heat will be helpful.  I will call you with the results.

## 2014-05-23 NOTE — Progress Notes (Signed)
  [facility] [address] [place], [place] [address] Phone: [phone] Subjective:     CC: Back pain  FVC:[id number] Harold [name] is a 49 y.o. male coming in with complaint of back pain. Patient and forcefully had an him, no flu and unfortunately was nauseated and started vomiting. Patient and forcefully started having significant severe right sided back pain. Patient is at the pain is on the right side and can go down his right thigh. Patient states this seems to be in the groin area heading towards his knee but never gets to his knee patient states that the pain is unrelenting in all times. Seems to feel better though when he is in a flexed position and worse when he is extended. Patient does have pain medications and has been taking this in addition to Flexeril as well as gabapentin with no significant improvement. Patient was put on a prednisone Dosepak not long ago. Patient has had this pain intermittently but usually on the left side.     PMHx Patient is going to a pain clinic. 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 Patient states that he went to [facility] and and there he did have surgery with ortho [place] for meniscal tear. Patient states that he did have a biocartilage implanted. Since the implanted BioCore patient has not made any improvement. Patient had removal of the BioCore 10 2015    Past medical history, social, surgical and family history all reviewed in electronic medical record.   Review of Systems: No headache, visual changes, nausea, vomiting, diarrhea, constipation, dizziness, abdominal pain, skin rash, fevers, chills, night sweats, weight loss, swollen lymph nodes, body aches, joint swelling, muscle aches, chest pain, shortness of breath, mood changes.   Objective Blood pressure 140/78, pulse 78, height 5\' 11"  (1.803 m), weight 256 lb  (116.121 kg), SpO2 97 %.  General: No apparent distress alert and oriented x3 mood and affect normal, dressed appropriately. obese HEENT: Pupils equal, extraocular movements intact  Respiratory: Patient's speak in full sentences and does not appear short of breath  Cardiovascular: No lower extremity edema, non tender, no erythema  Skin: Warm dry intact with no signs of infection or rash on extremities or on axial skeleton.  Abdomen: Soft nontender no hernia palpated Neuro: Cranial nerves II through XII are intact, neurovascularly intact in all extremities with 2+ DTRs and 2+ pulses.  Lymph: No lymphadenopathy of posterior or anterior cervical chain or axillae bilaterally.  Gait normal with good balance and coordination.  MSK:  Non tender with full range of motion and good stability and symmetric strength and tone of shoulders, elbows, wrist, hip, and ankles bilaterally.  Back Exam:  Inspection: Unremarkable  Motion: Flexion 45 deg, Extension 15 deg, Side Bending to 15 deg bilaterally,  Rotation to 15 deg bilaterally  SLR laying: Positive right and to patient's groin XSLR laying: Positive right Palpable tenderness: Severe tenderness to palpation over the paraspinal musculature. FABER: negative. Sensory change: Gross sensation intact to all lumbar and sacral dermatomes.  Reflexes: 2+ at both patellar tendons, 2+ at achilles tendons, Babinski's downgoing.  Strength at foot  Unable to do strength testing secondary to patient's pain. Gait unremarkable.       Impression and Recommendations:     This case required medical decision making of moderate complexity.

## 2014-05-26 ENCOUNTER — Telehealth: Payer: Self-pay | Admitting: Family Medicine

## 2014-05-26 NOTE — Telephone Encounter (Signed)
Pt request result for the MRI, please call him back

## 2014-05-26 NOTE — Telephone Encounter (Signed)
Spoke with pt, let him know we are awaiting approval from his insurance company for the MRI.

## 2014-05-26 NOTE — Telephone Encounter (Signed)
OK to order MRI?

## 2014-06-04 ENCOUNTER — Ambulatory Visit (INDEPENDENT_AMBULATORY_CARE_PROVIDER_SITE_OTHER): Payer: Commercial Managed Care - HMO | Admitting: Internal Medicine

## 2014-06-04 ENCOUNTER — Ambulatory Visit
Admission: RE | Admit: 2014-06-04 | Discharge: 2014-06-04 | Disposition: A | Discharge status: Home / Self Care | Payer: Commercial Managed Care - HMO | Source: Ambulatory Visit | Attending: Family Medicine | Admitting: Family Medicine

## 2014-06-04 ENCOUNTER — Encounter: Payer: Self-pay | Admitting: Internal Medicine

## 2014-06-04 VITALS — BP 152/88 | HR 71 | Temp 98.6°F | Resp 20 | Ht 71.0 in | Wt 252.0 lb

## 2014-06-04 DIAGNOSIS — M549 Dorsalgia, unspecified: Secondary | ICD-10-CM | POA: Diagnosis not present

## 2014-06-04 DIAGNOSIS — J019 Acute sinusitis, unspecified: Secondary | ICD-10-CM

## 2014-06-04 DIAGNOSIS — I10 Essential (primary) hypertension: Secondary | ICD-10-CM

## 2014-06-04 DIAGNOSIS — M5416 Radiculopathy, lumbar region: Secondary | ICD-10-CM

## 2014-06-04 DIAGNOSIS — R059 Cough, unspecified: Secondary | ICD-10-CM

## 2014-06-04 DIAGNOSIS — M4806 Spinal stenosis, lumbar region: Secondary | ICD-10-CM | POA: Diagnosis not present

## 2014-06-04 DIAGNOSIS — G8929 Other chronic pain: Secondary | ICD-10-CM

## 2014-06-04 DIAGNOSIS — R05 Cough: Secondary | ICD-10-CM | POA: Diagnosis not present

## 2014-06-04 DIAGNOSIS — M5126 Other intervertebral disc displacement, lumbar region: Secondary | ICD-10-CM | POA: Diagnosis not present

## 2014-06-04 HISTORY — DX: Acute sinusitis, unspecified: J01.90

## 2014-06-04 MED ORDER — HYDROCODONE-ACETAMINOPHEN 7.5-325 MG PO TABS: 1.0000 | ORAL_TABLET | Freq: Four times a day (QID) | ORAL | Status: DC | PRN

## 2014-06-04 MED ORDER — AZITHROMYCIN 250 MG PO TABS: ORAL_TABLET | ORAL | Status: DC

## 2014-06-04 NOTE — Patient Instructions (Addendum)
Please take all new medication as prescribed  - the antibiotic, and cough medicine  Please continue all other medications as before, and refills have been done if requested.  Please keep your appointments with your specialists as you may have planned, including Dr Tamala Julian regarding the MRI, adn the orthopedic on Friday

## 2014-06-04 NOTE — Progress Notes (Signed)
Subjective:    Patient ID: Harold [name], male    DOB: Apr 13, 1965, 49 y.o.   MRN: [id number]  HPI   Here with 2-3 days acute onset fever, facial pain, pressure, headache, general weakness and malaise, and greenish d/c, with mild ST and cough, but pt denies chest pain, wheezing, increased sob or doe, orthopnea, PND, increased LE swelling, palpitations, dizziness or syncope. Has been seeing Dr Tamala Julian with worsening right LBP and RLE involvement, MRI today with L3-4 right disc herniation. Pt denies new neurological symptoms such as new headache, or facial or extremity weakness or numbness other than this. Past Medical History  Diagnosis Date  . Obesity   . Metabolic syndrome   . Hypertension   . Depression   . GERD (gastroesophageal reflux disease)   . Chronic back pain   . History of pancreatitis     a. admx 04-2009.Marland KitchenMarland Kitchen? 2-2 triglycerides  . Hypertriglyceridemia     a. followed by [facility]  . Acute pericarditis     admx 03-26-10 thru 03-29-10; a. echo 03-26-10: EF 55-60%; mild LVH; trivial MR; RVF ok; mild -mod circumferential Eff w/o tamponade  . Constrictive pericarditis   . OSA (obstructive sleep apnea)   . Gout     pt denies  . Family history of malignant neoplasm of gastrointestinal tract   . Allergic rhinitis, cause unspecified 09/14/2012  . DDD (degenerative disc disease), lumbar 09/14/2012  . Left sided sciatica 09/14/2012  . Erectile dysfunction 09/14/2012  . Hyperlipidemia 07/11/2013   Past Surgical History  Procedure Laterality Date  . Tonsillectomy and adenoidectomy      one tonsil  . Pericardectomy  07/15/2010    Dr. [name]  . Pleurx catheter placement  07/15/2010    Dr. [name]  . Shoulder surgery      right and left shoulders  . Knee surgery      right    reports that he has been smoking Cigarettes.  He has a 10 pack-year smoking history. He has never used smokeless tobacco. He reports that he drinks alcohol. He reports that he does not use illicit  drugs. family history includes Colon cancer in his father; Diabetes in his father, mother, and sister; Heart disease in his father; Hypertension in his other; Liver cancer in his paternal uncle; Pancreatitis in his father and paternal uncle; Stomach cancer in his maternal grandfather. There is no history of Esophageal cancer or Rectal cancer. Allergies  Allergen Reactions  . Colchicine     REACTION: diarrhea  . Diphenhydramine Hcl     REACTION: itchingm :jittery"  . Lipitor [Atorvastatin]     Muscle cramps  . Other Other (See Comments)    [place] Reaction: itching and swelling of the tongue    Current Outpatient Prescriptions on File Prior to Visit  Medication Sig Dispense Refill  . amLODipine (NORVASC) 10 MG tablet TAKE ONE TABLET BY MOUTH DAILY 90 tablet 2  . aspirin 81 MG EC tablet Take 1 tablet (81 mg total) by mouth daily. Swallow whole. 30 tablet 12  . atorvastatin (LIPITOR) 20 MG tablet Take 1 tablet (20 mg total) by mouth daily. 90 tablet 3  . cetirizine (ZYRTEC) 10 MG tablet Take 1 tablet (10 mg total) by mouth daily. 30 tablet 11  . citalopram (CELEXA) 40 MG tablet TAKE 1 TABLET BY MOUTH DAILY 90 tablet 3  . clonazePAM (KLONOPIN) 0.5 MG tablet TAKE 1 TABLET BY MOUTH TWICE DAILY AS NEEDED FOR ANXIETY 60 tablet 1  .  cyclobenzaprine (FLEXERIL) 10 MG tablet Take 1 tablet (10 mg total) by mouth 2 (two) times daily as needed for muscle spasms. 20 tablet 0  . cyclobenzaprine (FLEXERIL) 10 MG tablet TAKE 1/2 TO 1 TAB BY MOUTH THREE TIMES PER DAY FOR MUSCLE SPASMS 90 tablet 0  . Diclofenac Sodium (PENNSAID) 2 % SOLN Place 2 application onto the skin 2 (two) times daily. 112 g 3  . fenofibrate (TRICOR) 145 MG tablet TAKE ONE TABLET BY MOUTH DAILY 90 tablet 3  . fluticasone (FLONASE) 50 MCG/ACT nasal spray Place 2 sprays into both nostrils daily. 16 g 2  . furosemide (LASIX) 20 MG tablet TAKE ONE TABLET BY MOUTH DAILY 90 tablet 3  . gabapentin (NEURONTIN) 300 MG capsule Take 1 capsule (300  mg total) by mouth 3 (three) times daily. 90 capsule 3  . ketoconazole (NIZORAL) 2 % cream Apply 1 application topically daily. 30 g 1  . lisinopril (PRINIVIL,ZESTRIL) 20 MG tablet TAKE ONE TABLET BY MOUTH TWICE DAILY 180 tablet 3  . methocarbamol (ROBAXIN) 500 MG tablet   0  . potassium chloride (K-DUR) 10 MEQ tablet TAKE ONE TABLET BY MOUTH DAILY 90 tablet 3  . predniSONE (DELTASONE) 10 MG tablet Take 1 tablet (10 mg total) by mouth daily. 3 tabs by mouth per day for 3 days, then 2 tabs per day for 3 days, then 1 tab per day for 3 days, then stop 18 tablet 0  . rosuvastatin (CRESTOR) 20 MG tablet Take 1 tablet (20 mg total) by mouth daily. 90 tablet 3  . tadalafil (CIALIS) 20 MG tablet Take 1 tablet (20 mg total) by mouth daily as needed for erectile dysfunction. 3 tablet 0  . varenicline (CHANTIX CONTINUING MONTH PAK) 1 MG tablet Take 1 tablet (1 mg total) by mouth 2 (two) times daily. 60 tablet 1  . [DISCONTINUED] amoxicillin-clarithromycin-lansoprazole (PREVPAC) combo pack Take by mouth 2 (two) times daily. Follow package directions. 1 kit 0   No current facility-administered medications on file prior to visit.   Review of Systems  Constitutional: Negative for unusual diaphoresis or night sweats HENT: Negative for ringing in ear or discharge Eyes: Negative for double vision or worsening visual disturbance.  Respiratory: Negative for choking and stridor.   Gastrointestinal: Negative for vomiting or other signifcant bowel change Genitourinary: Negative for hematuria or change in urine volume.  Musculoskeletal: Negative for other MSK pain or swelling Skin: Negative for color change and worsening wound.  Neurological: Negative for tremors and numbness other than noted  Psychiatric/Behavioral: Negative for decreased concentration or agitation other than above       Objective:   Physical Exam BP 152/88 mmHg  Pulse 71  Temp(Src) 98.6 F (37 C) (Oral)  Resp 20  Ht $R'5\' 11"'UU$  (1.803 m)  Wt  252 lb (114.306 kg)  BMI 35.16 kg/m2  SpO2 97% VS noted, mild ill Constitutional: Pt appears in no significant distress HENT: Head: NCAT.  Right Ear: External ear normal.  Left Ear: External ear normal.  Eyes: . Pupils are equal, round, and reactive to light. Conjunctivae and EOM are normal Bilat tm's with mild erythema.  Max sinus areas mild tender.  Pharynx with mild erythema, no exudate Neck: Normal range of motion. Neck supple.  Cardiovascular: Normal rate and regular rhythm.   Pulmonary/Chest: Effort normal and breath sounds without rales or wheezing.  Neurological: Pt is alert. Not confused o/w not done indetail Skin: Skin is warm. No rash, no LE edema Psychiatric: Pt behavior is  normal. No agitation.     Assessment & Plan:

## 2014-06-04 NOTE — Assessment & Plan Note (Signed)
Mild to mod, for antibx course,  to f/u any worsening symptoms or concerns 

## 2014-06-04 NOTE — Assessment & Plan Note (Signed)
With MRI evidence today for L3-4 disc dz c/w pain, for pain med refill, f/u with ortho as planned on Friday mar 11

## 2014-06-04 NOTE — Assessment & Plan Note (Signed)
Mild elev today likely reactive, cont same tx,  to f/u any worsening symptoms or concerns

## 2014-06-04 NOTE — Assessment & Plan Note (Signed)
Exam benign, for delsym otc prn

## 2014-06-05 ENCOUNTER — Other Ambulatory Visit: Payer: Self-pay | Admitting: *Deleted

## 2014-06-05 DIAGNOSIS — M5416 Radiculopathy, lumbar region: Secondary | ICD-10-CM

## 2014-06-06 DIAGNOSIS — M1731 Unilateral post-traumatic osteoarthritis, right knee: Secondary | ICD-10-CM | POA: Diagnosis not present

## 2014-06-06 DIAGNOSIS — M5441 Lumbago with sciatica, right side: Secondary | ICD-10-CM | POA: Diagnosis not present

## 2014-06-13 ENCOUNTER — Telehealth: Payer: Self-pay | Admitting: Family Medicine

## 2014-06-13 NOTE — Telephone Encounter (Signed)
Pt want to speak to the assistant concern about his back pain. Please call pt

## 2014-06-13 NOTE — Telephone Encounter (Signed)
Spoke to pt, he states that he is in a lot of pain & he still has not been scheduled for his epidural injection. Pt states that he has called [facility], left a message with danielle & has not heard back from her. I advised pt I will try to call over to [facility] to get him scheduled asap.

## 2014-06-20 ENCOUNTER — Ambulatory Visit
Admission: RE | Admit: 2014-06-20 | Discharge: 2014-06-20 | Disposition: A | Discharge status: Home / Self Care | Payer: Commercial Managed Care - HMO | Source: Ambulatory Visit | Attending: Family Medicine | Admitting: Family Medicine

## 2014-06-20 DIAGNOSIS — M5126 Other intervertebral disc displacement, lumbar region: Secondary | ICD-10-CM | POA: Diagnosis not present

## 2014-06-20 DIAGNOSIS — M5416 Radiculopathy, lumbar region: Secondary | ICD-10-CM

## 2014-06-20 DIAGNOSIS — M545 Low back pain: Secondary | ICD-10-CM | POA: Diagnosis not present

## 2014-06-20 MED ORDER — METHYLPREDNISOLONE ACETATE 40 MG/ML INJ SUSP (RADIOLOG
120.0000 mg | Freq: Once | INTRAMUSCULAR | Status: AC
  Administered 2014-06-20: 120 mg via EPIDURAL

## 2014-06-20 MED ORDER — IOHEXOL 180 MG/ML  SOLN
1.0000 mL | Freq: Once | INTRAMUSCULAR | Status: AC | PRN
  Administered 2014-06-20: 1 mL via EPIDURAL

## 2014-06-20 NOTE — Discharge Instructions (Signed)

## 2014-06-24 ENCOUNTER — Telehealth: Payer: Self-pay

## 2014-06-24 MED ORDER — HYDROCODONE-ACETAMINOPHEN 10-325 MG PO TABS
1.0000 | ORAL_TABLET | Freq: Four times a day (QID) | ORAL | Status: DC | PRN
Start: 2014-06-24 — End: 2014-07-16

## 2014-06-24 NOTE — Telephone Encounter (Signed)
Pt. Notified. RX is up front ready for pickup.

## 2014-06-24 NOTE — Telephone Encounter (Signed)
[place] for hydrocodone 10/325  - refille Done hardcopy to [employer]

## 2014-06-24 NOTE — Telephone Encounter (Signed)
Pt. Is in a lot of pain with knee. He is currently taking Norco 7.5-325mg  for pain and wants to know if you could increase this medication for him. He was at [facility] today and had asked the doctor there but that doctor declined to do so since you were already giving him pain medication.

## 2014-07-04 ENCOUNTER — Telehealth: Payer: Self-pay

## 2014-07-04 NOTE — Telephone Encounter (Signed)
Patient called to educate on Medicare Wellness apt. LVM for the patient to call back to educate and schedule for wellness visit.   

## 2014-07-16 ENCOUNTER — Encounter: Payer: Self-pay | Admitting: Internal Medicine

## 2014-07-16 ENCOUNTER — Ambulatory Visit (INDEPENDENT_AMBULATORY_CARE_PROVIDER_SITE_OTHER): Payer: Commercial Managed Care - HMO | Admitting: Internal Medicine

## 2014-07-16 VITALS — BP 124/78 | HR 72 | Temp 98.2°F | Resp 18

## 2014-07-16 DIAGNOSIS — R7302 Impaired glucose tolerance (oral): Secondary | ICD-10-CM

## 2014-07-16 DIAGNOSIS — I10 Essential (primary) hypertension: Secondary | ICD-10-CM

## 2014-07-16 DIAGNOSIS — G894 Chronic pain syndrome: Secondary | ICD-10-CM | POA: Diagnosis not present

## 2014-07-16 DIAGNOSIS — M1731 Unilateral post-traumatic osteoarthritis, right knee: Secondary | ICD-10-CM | POA: Diagnosis not present

## 2014-07-16 MED ORDER — TADALAFIL 20 MG PO TABS: 20.0000 mg | ORAL_TABLET | Freq: Every day | ORAL | Status: DC | PRN

## 2014-07-16 MED ORDER — ATORVASTATIN CALCIUM 20 MG PO TABS: 20.0000 mg | ORAL_TABLET | Freq: Every day | ORAL | Status: DC

## 2014-07-16 MED ORDER — FUROSEMIDE 20 MG PO TABS: 20.0000 mg | ORAL_TABLET | Freq: Every day | ORAL | Status: DC

## 2014-07-16 MED ORDER — CITALOPRAM HYDROBROMIDE 40 MG PO TABS: 40.0000 mg | ORAL_TABLET | Freq: Every day | ORAL | Status: DC

## 2014-07-16 MED ORDER — OXYCODONE-ACETAMINOPHEN 5-325 MG PO TABS: 1.0000 | ORAL_TABLET | Freq: Four times a day (QID) | ORAL | Status: DC | PRN

## 2014-07-16 MED ORDER — ROSUVASTATIN CALCIUM 20 MG PO TABS: 20.0000 mg | ORAL_TABLET | Freq: Every day | ORAL | Status: DC

## 2014-07-16 MED ORDER — LISINOPRIL 20 MG PO TABS: 20.0000 mg | ORAL_TABLET | Freq: Two times a day (BID) | ORAL | Status: DC

## 2014-07-16 NOTE — Assessment & Plan Note (Signed)
Lower back and knee - for refill pain med, refer pain clinic, I have d/w pt I do not practice long term chronic pain

## 2014-07-16 NOTE — Addendum Note (Signed)
Addended by: Valerie Salts on: 07/16/2014 03:57 PM   Modules accepted: Orders

## 2014-07-16 NOTE — Progress Notes (Signed)
Subjective:    Patient ID: Harold [name], male    DOB: 1966/03/26, 49 y.o.   MRN: [id number]  HPI  Here to f/u, Pt continues to have recurring LBP without change in severity, bowel or bladder change, fever, wt loss,  worsening LE pain/numbness/weakness, gait change or falls, except for recent numnbess to standing to both legs on occasion.  Has seen ortho about this already today, and I ave reviewed the note per Dr Rhona Raider.  Brace has been prescribed.  Pt needs pain managent referral as well Past Medical History  Diagnosis Date  . Obesity   . Metabolic syndrome   . Hypertension   . Depression   . GERD (gastroesophageal reflux disease)   . Chronic back pain   . History of pancreatitis     a. admx 04-2009.Marland KitchenMarland Kitchen? 2-2 triglycerides  . Hypertriglyceridemia     a. followed by [facility]  . Acute pericarditis     admx 03-26-10 thru 03-29-10; a. echo 03-26-10: EF 55-60%; mild LVH; trivial MR; RVF ok; mild -mod circumferential Eff w/o tamponade  . Constrictive pericarditis   . OSA (obstructive sleep apnea)   . Gout     pt denies  . Family history of malignant neoplasm of gastrointestinal tract   . Allergic rhinitis, cause unspecified 09/14/2012  . DDD (degenerative disc disease), lumbar 09/14/2012  . Left sided sciatica 09/14/2012  . Erectile dysfunction 09/14/2012  . Hyperlipidemia 07/11/2013   Past Surgical History  Procedure Laterality Date  . Tonsillectomy and adenoidectomy      one tonsil  . Pericardectomy  07/15/2010    Dr. [name]  . Pleurx catheter placement  07/15/2010    Dr. [name]  . Shoulder surgery      right and left shoulders  . Knee surgery      right    reports that he has been smoking Cigarettes.  He has a 10 pack-year smoking history. He has never used smokeless tobacco. He reports that he drinks alcohol. He reports that he does not use illicit drugs. family history includes Colon cancer in his father; Diabetes in his father, mother, and sister; Heart disease in  his father; Hypertension in his other; Liver cancer in his paternal uncle; Pancreatitis in his father and paternal uncle; Stomach cancer in his maternal grandfather. There is no history of Esophageal cancer or Rectal cancer. Allergies  Allergen Reactions  . Diphenhydramine Itching, Palpitations and Other (See Comments)    "jittery"  . Other Itching and Swelling    Pecans: itching and swelling of the tongue   . Colchicine Diarrhea  . Lipitor [Atorvastatin] Other (See Comments)    Muscle cramps   Current Outpatient Prescriptions on File Prior to Visit  Medication Sig Dispense Refill  . amLODipine (NORVASC) 10 MG tablet TAKE ONE TABLET BY MOUTH DAILY 90 tablet 2  . aspirin 81 MG tablet     . cetirizine (ZYRTEC) 10 MG tablet Take 1 tablet (10 mg total) by mouth daily. 30 tablet 11  . citalopram (CELEXA) 40 MG tablet TAKE 1 TABLET BY MOUTH DAILY 90 tablet 3  . clonazePAM (KLONOPIN) 0.5 MG tablet TAKE 1 TABLET BY MOUTH TWICE DAILY AS NEEDED FOR ANXIETY 60 tablet 1  . cyclobenzaprine (FLEXERIL) 10 MG tablet Take 1 tablet (10 mg total) by mouth 2 (two) times daily as needed for muscle spasms. 20 tablet 0  . cyclobenzaprine (FLEXERIL) 10 MG tablet TAKE 1/2 TO 1 TAB BY MOUTH THREE TIMES PER DAY FOR MUSCLE  SPASMS 90 tablet 0  . fenofibrate (TRICOR) 145 MG tablet TAKE ONE TABLET BY MOUTH DAILY 90 tablet 3  . fluticasone (FLONASE) 50 MCG/ACT nasal spray Place 2 sprays into both nostrils daily. 16 g 2  . furosemide (LASIX) 20 MG tablet TAKE ONE TABLET BY MOUTH DAILY 90 tablet 3  . gabapentin (NEURONTIN) 100 MG capsule     . ketoconazole (NIZORAL) 2 % cream Apply 1 application topically daily. 30 g 1  . levofloxacin (LEVAQUIN) 500 MG tablet     . lisinopril (PRINIVIL,ZESTRIL) 20 MG tablet TAKE ONE TABLET BY MOUTH TWICE DAILY 180 tablet 3  . meloxicam (MOBIC) 15 MG tablet     . methocarbamol (ROBAXIN) 500 MG tablet   0  . potassium chloride (K-DUR) 10 MEQ tablet TAKE ONE TABLET BY MOUTH DAILY 90 tablet  3  . predniSONE (DELTASONE) 10 MG tablet Take 1 tablet (10 mg total) by mouth daily. 3 tabs by mouth per day for 3 days, then 2 tabs per day for 3 days, then 1 tab per day for 3 days, then stop 18 tablet 0  . rosuvastatin (CRESTOR) 20 MG tablet Take 1 tablet (20 mg total) by mouth daily. 90 tablet 3  . tadalafil (CIALIS) 20 MG tablet Take 1 tablet (20 mg total) by mouth daily as needed for erectile dysfunction. 3 tablet 0  . varenicline (CHANTIX CONTINUING MONTH PAK) 1 MG tablet Take 1 tablet (1 mg total) by mouth 2 (two) times daily. 60 tablet 1  . VOLTAREN 1 % GEL     . atorvastatin (LIPITOR) 20 MG tablet Take 1 tablet (20 mg total) by mouth daily. 90 tablet 3  . [DISCONTINUED] amoxicillin-clarithromycin-lansoprazole (PREVPAC) combo pack Take by mouth 2 (two) times daily. Follow package directions. 1 kit 0   No current facility-administered medications on file prior to visit.   Review of Systems  Constitutional: Negative for unusual diaphoresis or night sweats HENT: Negative for ringing in ear or discharge Eyes: Negative for double vision or worsening visual disturbance.  Respiratory: Negative for choking and stridor.   Gastrointestinal: Negative for vomiting or other signifcant bowel change Genitourinary: Negative for hematuria or change in urine volume.  Musculoskeletal: Negative for other MSK pain or swelling Skin: Negative for color change and worsening wound.  Neurological: Negative for tremors and numbness other than noted  Psychiatric/Behavioral: Negative for decreased concentration or agitation other than above       Objective:   Physical Exam BP 124/78 mmHg  Pulse 72  Temp(Src) 98.2 F (36.8 C) (Oral)  Resp 18  SpO2 96% VS noted,  Constitutional: Pt appears in no significant distress HENT: Head: NCAT.  Right Ear: External ear normal.  Left Ear: External ear normal.  Eyes: . Pupils are equal, round, and reactive to light. Conjunctivae and EOM are normal Neck: Normal  range of motion. Neck supple.  Cardiovascular: Normal rate and regular rhythm.   Pulmonary/Chest: Effort normal and breath sounds without rales or wheezing.  Abd:  Soft, NT, ND, + BS Spine difufse tender  Midline lumbar and left lateral paravertebral Neurological: Pt is alert. Not confused , motor grossly intact Skin: Skin is warm. No rash, no LE edema Psychiatric: Pt behavior is normal. No agitation.  Lab Results  Component Value Date   WBC 14.2* 12/27/2013   HGB 14.9 12/27/2013   HCT 45.1 12/27/2013   PLT 355.0 12/27/2013   GLUCOSE 112* 11/19/2013   CHOL 146 11/19/2013   TRIG 145.0 11/19/2013   HDL  37.80* 11/19/2013   LDLDIRECT 124.5 09/14/2012   LDLCALC 79 11/19/2013   ALT 17 11/19/2013   AST 23 11/19/2013   NA 138 11/19/2013   K 4.1 11/19/2013   CL 103 11/19/2013   CREATININE 0.9 11/19/2013   BUN 12 11/19/2013   CO2 27 11/19/2013   TSH 0.34* 11/19/2013   PSA 1.33 11/19/2013   INR 1.72* 07/05/2010   HGBA1C 6.5 11/19/2013   MICROALBUR 1.6 04/12/2011       Assessment & Plan:

## 2014-07-16 NOTE — Assessment & Plan Note (Signed)
stable overall by history and exam, recent data reviewed with pt, and pt to continue medical treatment as before,  to f/u any worsening symptoms or concerns Lab Results  Component Value Date   HGBA1C 6.5 11/19/2013   D/w pt - cortisone shot earlier today to not likely cause signficant problem with elevating sugar

## 2014-07-16 NOTE — Patient Instructions (Signed)
Please continue all other medications as before, and refills have been done if requested.  Please have the pharmacy call with any other refills you may need.  Please continue your efforts at being more active, low cholesterol diet, and weight control.  Please keep your appointments with your specialists as you may have planned  You will be contacted regarding the referral for: pain clinic

## 2014-07-16 NOTE — Assessment & Plan Note (Signed)
stable overall by history and exam, recent data reviewed with pt, and pt to continue medical treatment as before,  to f/u any worsening symptoms or concerns BP Readings from Last 3 Encounters:  07/16/14 124/78  06/20/14 153/90  06/04/14 152/88

## 2014-07-30 ENCOUNTER — Telehealth: Payer: Self-pay

## 2014-07-30 NOTE — Telephone Encounter (Signed)
Patient called to educate on Medicare Wellness apt. LVM for the patient to call back to educate and schedule for wellness visit.   

## 2014-08-01 ENCOUNTER — Encounter: Payer: Self-pay | Admitting: Internal Medicine

## 2014-08-01 ENCOUNTER — Ambulatory Visit (INDEPENDENT_AMBULATORY_CARE_PROVIDER_SITE_OTHER): Payer: Commercial Managed Care - HMO | Admitting: Internal Medicine

## 2014-08-01 VITALS — BP 122/84 | HR 79 | Temp 98.7°F | Resp 18 | Ht 71.0 in | Wt 252.1 lb

## 2014-08-01 DIAGNOSIS — G894 Chronic pain syndrome: Secondary | ICD-10-CM | POA: Diagnosis not present

## 2014-08-01 MED ORDER — OXYCODONE-ACETAMINOPHEN 10-325 MG PO TABS: 1.0000 | ORAL_TABLET | Freq: Four times a day (QID) | ORAL | Status: DC | PRN

## 2014-08-01 MED ORDER — VOLTAREN 1 % TD GEL: 2.0000 g | Freq: Four times a day (QID) | TRANSDERMAL | Status: DC

## 2014-08-01 NOTE — Telephone Encounter (Signed)
Call to the patient to fup regarding AWV; Left 2nd message for call back to educate on visit and schedule

## 2014-08-01 NOTE — Patient Instructions (Signed)
Please take all new medication as prescribed - the percocet 10/325 (gave 2 months in hardcopy)  Please continue all other medications as before, and refills have been done if requested.  Please have the pharmacy call with any other refills you may need.  Please keep your appointments with your specialists as you may have planned  Hopefully you will hear soon about the pain clinic referral

## 2014-08-01 NOTE — Assessment & Plan Note (Signed)
Ok for percocet 10/325 qid prn, cont pain management referral , to f/u any worsening symptoms or concerns

## 2014-08-01 NOTE — Progress Notes (Signed)
Subjective:    Patient ID: Harold [name], male    DOB: 01/23/1966, 49 y.o.   MRN: [id number]  HPI  Here to f/u, pain not well controlled, somewhat worse on the left today, but no worsening LE pain/weak/numbness.  Takes 2 of the percocet on occasion with flares of pain.  Has not yet heard from pain clinic. Pt denies chest pain, increased sob or doe, wheezing, orthopnea, PND, increased LE swelling, palpitations, dizziness or syncope. Past Medical History  Diagnosis Date  . Obesity   . Metabolic syndrome   . Hypertension   . Depression   . GERD (gastroesophageal reflux disease)   . Chronic back pain   . History of pancreatitis     a. admx 04-2009.Marland KitchenMarland Kitchen? 2-2 triglycerides  . Hypertriglyceridemia     a. followed by [facility]  . Acute pericarditis     admx 03-26-10 thru 03-29-10; a. echo 03-26-10: EF 55-60%; mild LVH; trivial MR; RVF ok; mild -mod circumferential Eff w/o tamponade  . Constrictive pericarditis   . OSA (obstructive sleep apnea)   . Gout     pt denies  . Family history of malignant neoplasm of gastrointestinal tract   . Allergic rhinitis, cause unspecified 09/14/2012  . DDD (degenerative disc disease), lumbar 09/14/2012  . Left sided sciatica 09/14/2012  . Erectile dysfunction 09/14/2012  . Hyperlipidemia 07/11/2013   Past Surgical History  Procedure Laterality Date  . Tonsillectomy and adenoidectomy      one tonsil  . Pericardectomy  07/15/2010    Dr. [name]  . Pleurx catheter placement  07/15/2010    Dr. [name]  . Shoulder surgery      right and left shoulders  . Knee surgery      right    reports that he has been smoking Cigarettes.  He has a 10 pack-year smoking history. He has never used smokeless tobacco. He reports that he drinks alcohol. He reports that he does not use illicit drugs. family history includes Colon cancer in his father; Diabetes in his father, mother, and sister; Heart disease in his father; Hypertension in his other; Liver cancer in his  paternal uncle; Pancreatitis in his father and paternal uncle; Stomach cancer in his maternal grandfather. There is no history of Esophageal cancer or Rectal cancer. Allergies  Allergen Reactions  . Diphenhydramine Itching, Palpitations and Other (See Comments)    "jittery"  . Other Itching and Swelling    Pecans: itching and swelling of the tongue   . Colchicine Diarrhea  . Lipitor [Atorvastatin] Other (See Comments)    Muscle cramps   Current Outpatient Prescriptions on File Prior to Visit  Medication Sig Dispense Refill  . amLODipine (NORVASC) 10 MG tablet TAKE ONE TABLET BY MOUTH DAILY 90 tablet 2  . aspirin 81 MG tablet     . atorvastatin (LIPITOR) 20 MG tablet Take 1 tablet (20 mg total) by mouth daily. 90 tablet 3  . cetirizine (ZYRTEC) 10 MG tablet Take 1 tablet (10 mg total) by mouth daily. 30 tablet 11  . citalopram (CELEXA) 40 MG tablet Take 1 tablet (40 mg total) by mouth daily. 90 tablet 3  . clonazePAM (KLONOPIN) 0.5 MG tablet TAKE 1 TABLET BY MOUTH TWICE DAILY AS NEEDED FOR ANXIETY 60 tablet 1  . cyclobenzaprine (FLEXERIL) 10 MG tablet Take 1 tablet (10 mg total) by mouth 2 (two) times daily as needed for muscle spasms. 20 tablet 0  . cyclobenzaprine (FLEXERIL) 10 MG tablet TAKE 1/2 TO 1  TAB BY MOUTH THREE TIMES PER DAY FOR MUSCLE SPASMS 90 tablet 0  . fenofibrate (TRICOR) 145 MG tablet TAKE ONE TABLET BY MOUTH DAILY 90 tablet 3  . fluticasone (FLONASE) 50 MCG/ACT nasal spray Place 2 sprays into both nostrils daily. 16 g 2  . furosemide (LASIX) 20 MG tablet Take 1 tablet (20 mg total) by mouth daily. 90 tablet 3  . gabapentin (NEURONTIN) 100 MG capsule     . ketoconazole (NIZORAL) 2 % cream Apply 1 application topically daily. 30 g 1  . levofloxacin (LEVAQUIN) 500 MG tablet     . lisinopril (PRINIVIL,ZESTRIL) 20 MG tablet Take 1 tablet (20 mg total) by mouth 2 (two) times daily. 180 tablet 3  . meloxicam (MOBIC) 15 MG tablet     . methocarbamol (ROBAXIN) 500 MG tablet   0    . oxyCODONE-acetaminophen (PERCOCET/ROXICET) 5-325 MG per tablet Take 1 tablet by mouth every 6 (six) hours as needed for severe pain. 120 tablet 0  . potassium chloride (K-DUR) 10 MEQ tablet TAKE ONE TABLET BY MOUTH DAILY 90 tablet 3  . predniSONE (DELTASONE) 10 MG tablet Take 1 tablet (10 mg total) by mouth daily. 3 tabs by mouth per day for 3 days, then 2 tabs per day for 3 days, then 1 tab per day for 3 days, then stop 18 tablet 0  . rosuvastatin (CRESTOR) 20 MG tablet Take 1 tablet (20 mg total) by mouth daily. 90 tablet 3  . tadalafil (CIALIS) 20 MG tablet Take 1 tablet (20 mg total) by mouth daily as needed for erectile dysfunction. 3 tablet 0  . VOLTAREN 1 % GEL     . [DISCONTINUED] amoxicillin-clarithromycin-lansoprazole (PREVPAC) combo pack Take by mouth 2 (two) times daily. Follow package directions. 1 kit 0   No current facility-administered medications on file prior to visit.   Review of Systems All otherwise neg per pt     Objective:   Physical Exam BP 122/84 mmHg  Pulse 79  Temp(Src) 98.7 F (37.1 C) (Oral)  Resp 18  Ht '5\' 11"'  (1.803 m)  Wt 252 lb 1.9 oz (114.361 kg)  BMI 35.18 kg/m2  SpO2 97% VS noted,  Constitutional: Pt appears in no significant distress HENT: Head: NCAT.  Right Ear: External ear normal.  Left Ear: External ear normal.  Eyes: . Pupils are equal, round, and reactive to light. Conjunctivae and EOM are normal Neck: Normal range of motion. Neck supple.  Cardiovascular: Normal rate and regular rhythm.   Pulmonary/Chest: Effort normal and breath sounds without rales or wheezing.  Abd:  Soft, NT, ND, + BS Spine diffuse lumbar tender Neurological: Pt is alert. Not confused , motor grossly intact Skin: Skin is warm. No rash, no LE edema Psychiatric: Pt behavior is normal. No agitation.     Assessment & Plan:

## 2014-08-04 ENCOUNTER — Telehealth: Payer: Self-pay | Admitting: Internal Medicine

## 2014-08-04 NOTE — Telephone Encounter (Signed)
Rec'd records from [place] 3pgs to Dr.John Jeneen Rinks

## 2014-08-05 ENCOUNTER — Ambulatory Visit: Payer: Commercial Managed Care - HMO

## 2014-08-06 ENCOUNTER — Telehealth: Payer: Self-pay

## 2014-08-06 ENCOUNTER — Ambulatory Visit: Payer: Commercial Managed Care - HMO

## 2014-08-06 DIAGNOSIS — M1731 Unilateral post-traumatic osteoarthritis, right knee: Secondary | ICD-10-CM | POA: Diagnosis not present

## 2014-08-06 NOTE — Telephone Encounter (Signed)
Call to the patient and stated he didn't feel well today; Rescheduled apt for next Wed at 11am for AWV

## 2014-08-13 ENCOUNTER — Ambulatory Visit: Payer: Commercial Managed Care - HMO

## 2014-09-03 ENCOUNTER — Ambulatory Visit: Payer: Commercial Managed Care - HMO

## 2014-09-17 ENCOUNTER — Ambulatory Visit: Payer: Commercial Managed Care - HMO | Admitting: Internal Medicine

## 2014-09-18 ENCOUNTER — Ambulatory Visit (INDEPENDENT_AMBULATORY_CARE_PROVIDER_SITE_OTHER): Payer: Commercial Managed Care - HMO | Admitting: Internal Medicine

## 2014-09-18 ENCOUNTER — Encounter: Payer: Self-pay | Admitting: Internal Medicine

## 2014-09-18 VITALS — BP 118/78 | HR 71 | Temp 98.7°F | Ht 71.0 in | Wt 256.0 lb

## 2014-09-18 DIAGNOSIS — M549 Dorsalgia, unspecified: Secondary | ICD-10-CM

## 2014-09-18 DIAGNOSIS — J019 Acute sinusitis, unspecified: Secondary | ICD-10-CM

## 2014-09-18 DIAGNOSIS — G8929 Other chronic pain: Secondary | ICD-10-CM | POA: Diagnosis not present

## 2014-09-18 DIAGNOSIS — J3089 Other allergic rhinitis: Secondary | ICD-10-CM | POA: Diagnosis not present

## 2014-09-18 MED ORDER — METHYLPREDNISOLONE ACETATE 80 MG/ML IJ SUSP
80.0000 mg | Freq: Once | INTRAMUSCULAR | Status: AC
  Administered 2014-09-18: 80 mg via INTRAMUSCULAR

## 2014-09-18 MED ORDER — SULFAMETHOXAZOLE-TRIMETHOPRIM 800-160 MG PO TABS: 1.0000 | ORAL_TABLET | Freq: Two times a day (BID) | ORAL | Status: DC

## 2014-09-18 MED ORDER — KETOROLAC TROMETHAMINE 30 MG/ML IM SOLN: 30.0000 mg | Freq: Once | INTRAMUSCULAR | Status: DC

## 2014-09-18 MED ORDER — KETOROLAC TROMETHAMINE 30 MG/ML IJ SOLN
30.0000 mg | Freq: Once | INTRAMUSCULAR | Status: AC
  Administered 2014-09-18: 30 mg via INTRAMUSCULAR

## 2014-09-18 MED ORDER — TADALAFIL 20 MG PO TABS
20.0000 mg | ORAL_TABLET | Freq: Every day | ORAL | Status: DC | PRN
Start: 2014-09-18 — End: 2015-02-17

## 2014-09-18 MED ORDER — OXYCODONE-ACETAMINOPHEN 10-325 MG PO TABS: 1.0000 | ORAL_TABLET | Freq: Four times a day (QID) | ORAL | Status: DC | PRN

## 2014-09-18 NOTE — Progress Notes (Signed)
Pre visit review using our clinic review tool, if applicable. No additional management support is needed unless otherwise documented below in the visit note. 

## 2014-09-18 NOTE — Addendum Note (Signed)
Addended by: Lowella Dandy on: 09/18/2014 04:28 PM   Modules accepted: Orders, Medications

## 2014-09-18 NOTE — Assessment & Plan Note (Signed)
Mild to mod, for depomedrol IM for seasonal flare,   to f/u any worsening symptoms or concerns

## 2014-09-18 NOTE — Progress Notes (Signed)
Subjective:    Patient ID: Harold [name], male    DOB: [birth year], 49 y.o.   MRN: [id number]  HPI   Here with 2-3 days acute onset fever, facial pain, pressure, headache, general weakness and malaise, and greenish d/c, with mild ST and cough, but pt denies chest pain, wheezing, increased sob or doe, orthopnea, PND, increased LE swelling, palpitations, dizziness or syncope. Also with a small now painful subq cystic like mass come up at the same time to the prox post left thigh just below the buttock.  Pain overall controlled, has not heard from pain management referal, needs pain med refill.  Does have several wks ongoing nasal allergy symptoms with clearish congestion, itch and sneezing, without fever, pain, ST, cough, swelling or wheezing. Asks for Ed med refill as well - Sinus pain overall approx 7/10 Past Medical History  Diagnosis Date  . Obesity   . Metabolic syndrome   . Hypertension   . Depression   . GERD (gastroesophageal reflux disease)   . Chronic back pain   . History of pancreatitis     a. admx 04-2009.Marland KitchenMarland Kitchen? 2-2 triglycerides  . Hypertriglyceridemia     a. followed by [facility]  . Acute pericarditis     admx 03-26-10 thru 03-29-10; a. echo 03-26-10: EF 55-60%; mild LVH; trivial MR; RVF ok; mild -mod circumferential Eff w/o tamponade  . Constrictive pericarditis   . OSA (obstructive sleep apnea)   . Gout     pt denies  . Family history of malignant neoplasm of gastrointestinal tract   . Allergic rhinitis, cause unspecified 09/14/2012  . DDD (degenerative disc disease), lumbar 09/14/2012  . Left sided sciatica 09/14/2012  . Erectile dysfunction 09/14/2012  . Hyperlipidemia 07/11/2013   Past Surgical History  Procedure Laterality Date  . Tonsillectomy and adenoidectomy      one tonsil  . Pericardectomy  07/15/2010    Dr. [name]  . Pleurx catheter placement  07/15/2010    Dr. [name]  . Shoulder surgery      right and left shoulders  . Knee surgery      right    reports that he has been smoking Cigarettes.  He has a 10 pack-year smoking history. He has never used smokeless tobacco. He reports that he drinks alcohol. He reports that he does not use illicit drugs. family history includes Colon cancer in his father; Diabetes in his father, mother, and sister; Heart disease in his father; Hypertension in his other; Liver cancer in his paternal uncle; Pancreatitis in his father and paternal uncle; Stomach cancer in his maternal grandfather. There is no history of Esophageal cancer or Rectal cancer. Allergies  Allergen Reactions  . Diphenhydramine Itching, Palpitations and Other (See Comments)    "jittery"  . Other Itching and Swelling    Pecans: itching and swelling of the tongue   . Colchicine Diarrhea  . Lipitor [Atorvastatin] Other (See Comments)    Muscle cramps   Current Outpatient Prescriptions on File Prior to Visit  Medication Sig Dispense Refill  . amLODipine (NORVASC) 10 MG tablet TAKE ONE TABLET BY MOUTH DAILY 90 tablet 2  . aspirin 81 MG tablet     . atorvastatin (LIPITOR) 20 MG tablet Take 1 tablet (20 mg total) by mouth daily. 90 tablet 3  . cetirizine (ZYRTEC) 10 MG tablet Take 1 tablet (10 mg total) by mouth daily. 30 tablet 11  . citalopram (CELEXA) 40 MG tablet Take 1 tablet (40 mg total) by mouth  daily. 90 tablet 3  . clonazePAM (KLONOPIN) 0.5 MG tablet TAKE 1 TABLET BY MOUTH TWICE DAILY AS NEEDED FOR ANXIETY 60 tablet 1  . cyclobenzaprine (FLEXERIL) 10 MG tablet Take 1 tablet (10 mg total) by mouth 2 (two) times daily as needed for muscle spasms. 20 tablet 0  . cyclobenzaprine (FLEXERIL) 10 MG tablet TAKE 1/2 TO 1 TAB BY MOUTH THREE TIMES PER DAY FOR MUSCLE SPASMS 90 tablet 0  . fenofibrate (TRICOR) 145 MG tablet TAKE ONE TABLET BY MOUTH DAILY 90 tablet 3  . fluticasone (FLONASE) 50 MCG/ACT nasal spray Place 2 sprays into both nostrils daily. 16 g 2  . furosemide (LASIX) 20 MG tablet Take 1 tablet (20 mg total) by mouth daily. 90  tablet 3  . gabapentin (NEURONTIN) 100 MG capsule     . ketoconazole (NIZORAL) 2 % cream Apply 1 application topically daily. 30 g 1  . levofloxacin (LEVAQUIN) 500 MG tablet     . lisinopril (PRINIVIL,ZESTRIL) 20 MG tablet Take 1 tablet (20 mg total) by mouth 2 (two) times daily. 180 tablet 3  . meloxicam (MOBIC) 15 MG tablet     . methocarbamol (ROBAXIN) 500 MG tablet   0  . oxyCODONE-acetaminophen (PERCOCET) 10-325 MG per tablet Take 1 tablet by mouth every 6 (six) hours as needed for pain. To fill August 31, 2014 120 tablet 0  . potassium chloride (K-DUR) 10 MEQ tablet TAKE ONE TABLET BY MOUTH DAILY 90 tablet 3  . predniSONE (DELTASONE) 10 MG tablet Take 1 tablet (10 mg total) by mouth daily. 3 tabs by mouth per day for 3 days, then 2 tabs per day for 3 days, then 1 tab per day for 3 days, then stop 18 tablet 0  . rosuvastatin (CRESTOR) 20 MG tablet Take 1 tablet (20 mg total) by mouth daily. 90 tablet 3  . tadalafil (CIALIS) 20 MG tablet Take 1 tablet (20 mg total) by mouth daily as needed for erectile dysfunction. 3 tablet 0  . VOLTAREN 1 % GEL Apply 2 g topically 4 (four) times daily. 100 g 5  . [DISCONTINUED] amoxicillin-clarithromycin-lansoprazole (PREVPAC) combo pack Take by mouth 2 (two) times daily. Follow package directions. 1 kit 0   No current facility-administered medications on file prior to visit.   Review of Systems  Constitutional: Negative for unusual diaphoresis or night sweats HENT: Negative for ringing in ear or discharge Eyes: Negative for double vision or worsening visual disturbance.  Respiratory: Negative for choking and stridor.   Gastrointestinal: Negative for vomiting or other signifcant bowel change Genitourinary: Negative for hematuria or change in urine volume.  Musculoskeletal: Negative for other MSK pain or swelling Skin: Negative for color change and worsening wound.  Neurological: Negative for tremors and numbness other than noted  Psychiatric/Behavioral:  Negative for decreased concentration or agitation other than above       Objective:   Physical Exam BP 118/78 mmHg  Pulse 71  Temp(Src) 98.7 F (37.1 C) (Oral)  Ht '5\' 11"'  (1.803 m)  Wt 256 lb (116.121 kg)  BMI 35.72 kg/m2  SpO2 97%  VS noted, mild ill Constitutional: Pt appears in no significant distress HENT: Head: NCAT.  Right Ear: External ear normal.  Left Ear: External ear normal.  Bilat tm's with mild erythema.  Max sinus areas mild tender.  Pharynx with mild erythema, no exudate Eyes: . Pupils are equal, round, and reactive to light. Conjunctivae and EOM are normal Neck: Normal range of motion. Neck supple.  Cardiovascular:  Normal rate and regular rhythm.   Pulmonary/Chest: Effort normal and breath sounds without rales or wheezing.  Neurological: Pt is alert. Not confused , motor grossly intact Skin: Skin is warm. No rash, no LE edema Psychiatric: Pt behavior is normal. No agitation.     Assessment & Plan:

## 2014-09-18 NOTE — Assessment & Plan Note (Addendum)
Mild to mod, for antibx course,  to f/u any worsening symptoms or concerns, also for toradol for pain today IM

## 2014-09-18 NOTE — Patient Instructions (Addendum)
You had the steroid shot today, as well as pain shot, and the Prevnar pneumonia shot  Please take all new medication as prescribed - the antibiotic  Please continue all other medications as before, and refills have been done if requeste - the pain medication  Please be aware this is the last prescription for the pain medication I will be willing to write, as I do not treat chronic pain  Please have the pharmacy call with any other refills you may need.  Please keep your appointments with your specialists as you may have planned

## 2014-09-18 NOTE — Assessment & Plan Note (Addendum)
Stable, awaiting pain clinic acceptance, for pain med refill, pt aware this is last refill I will do

## 2014-10-02 ENCOUNTER — Telehealth: Payer: Self-pay | Admitting: Internal Medicine

## 2014-10-02 NOTE — Telephone Encounter (Signed)
Update on the pain management situation. Please advise

## 2014-10-09 NOTE — Telephone Encounter (Signed)
Referral was canceled by [facility] and we were not notified. They will not see the patient. I have faxed his referral to [facility]. They will contact pt directly to schedule an appointment. Spoke w/pt and he is aware.

## 2014-10-27 ENCOUNTER — Telehealth: Payer: Self-pay | Admitting: Internal Medicine

## 2014-10-27 NOTE — Telephone Encounter (Signed)
Is requesting refill on percocet while he is waiting on referral for Pain management to go through.  Also states he is having an allergic reaction to an antibiotic Dr. Jenny Reichmann last prescribed.  He is itching all over.  He is requesting different antibiotic to be sent to [facility] at [place].

## 2014-10-28 ENCOUNTER — Encounter: Payer: Self-pay | Admitting: Internal Medicine

## 2014-10-28 ENCOUNTER — Ambulatory Visit (INDEPENDENT_AMBULATORY_CARE_PROVIDER_SITE_OTHER): Payer: Commercial Managed Care - HMO | Admitting: Internal Medicine

## 2014-10-28 VITALS — BP 152/96 | HR 80 | Temp 99.1°F | Ht 71.0 in | Wt 257.0 lb

## 2014-10-28 DIAGNOSIS — M545 Low back pain: Secondary | ICD-10-CM | POA: Diagnosis not present

## 2014-10-28 DIAGNOSIS — J019 Acute sinusitis, unspecified: Secondary | ICD-10-CM

## 2014-10-28 DIAGNOSIS — R7302 Impaired glucose tolerance (oral): Secondary | ICD-10-CM | POA: Diagnosis not present

## 2014-10-28 DIAGNOSIS — G894 Chronic pain syndrome: Secondary | ICD-10-CM

## 2014-10-28 MED ORDER — AMOXICILLIN 500 MG PO CAPS: 1000.0000 mg | ORAL_CAPSULE | Freq: Two times a day (BID) | ORAL | Status: DC

## 2014-10-28 NOTE — Telephone Encounter (Signed)
Left message advising patient that dr Jenny Reichmann will not be prescribing any other pain meds--pain clinic has been referred--if patient needs assistance before seen at pain clinic, patient will need to go to ED

## 2014-10-28 NOTE — Assessment & Plan Note (Signed)
stable overall by history and exam, recent data reviewed with pt, and pt to continue medical treatment as before,  to f/u any worsening symptoms or concerns Lab Results  Component Value Date   HGBA1C 6.5 11/19/2013

## 2014-10-28 NOTE — Telephone Encounter (Signed)
Very sorry, I do not continue to prescribe medication such percocet for over 3 months in the process of pain clinic referral, as I do not treat chronic pain.  Referral was placed April 2016

## 2014-10-28 NOTE — Telephone Encounter (Signed)
Please advise, thanks.

## 2014-10-28 NOTE — Assessment & Plan Note (Signed)
Mild to mod, for antibx course,  to f/u any worsening symptoms or concerns 

## 2014-10-28 NOTE — Telephone Encounter (Signed)
Wife called back in.  I gave her Dr. Judi Cong response.  She is requesting call back because they do not know how to get medication while waiting to get in with another pain management clinic.

## 2014-10-28 NOTE — Progress Notes (Signed)
Subjective:    Patient ID: Harold [name], male    DOB: October 01, 1965, 49 y.o.   MRN: [id number]  HPI   Here with 2-3 days acute onset fever, facial pain, pressure, headache, general weakness and malaise, and greenish d/c, with mild ST and cough, but pt denies chest pain, wheezing, increased sob or doe, orthopnea, PND, increased LE swelling, palpitations, dizziness or syncope.  Really never got completely better after last visit, only able to take the septra for 2 days due to rash and itching lasting several days.   Pt denies new neurological symptoms such as new headache, or facial or extremity weakness or numbness  Very concerned about his narcotic refill as I have declined to continue to refill his med as I no longer practice chronic pain with controlled substances, and have been clear about this since referral done in April 2016.   Pt denies polydipsia, polyuria, or low sugar symptoms such as weakness or confusion improved with po intake. Past Medical History  Diagnosis Date  . Obesity   . Metabolic syndrome   . Hypertension   . Depression   . GERD (gastroesophageal reflux disease)   . Chronic back pain   . History of pancreatitis     a. admx 04-2009.Marland KitchenMarland Kitchen? 2-2 triglycerides  . Hypertriglyceridemia     a. followed by [facility]  . Acute pericarditis     admx 03-26-10 thru 03-29-10; a. echo 03-26-10: EF 55-60%; mild LVH; trivial MR; RVF ok; mild -mod circumferential Eff w/o tamponade  . Constrictive pericarditis   . OSA (obstructive sleep apnea)   . Gout     pt denies  . Family history of malignant neoplasm of gastrointestinal tract   . Allergic rhinitis, cause unspecified 09/14/2012  . DDD (degenerative disc disease), lumbar 09/14/2012  . Left sided sciatica 09/14/2012  . Erectile dysfunction 09/14/2012  . Hyperlipidemia 07/11/2013   Past Surgical History  Procedure Laterality Date  . Tonsillectomy and adenoidectomy      one tonsil  . Pericardectomy  07/15/2010    Dr. [name]  .  Pleurx catheter placement  07/15/2010    Dr. [name]  . Shoulder surgery      right and left shoulders  . Knee surgery      right    reports that he has been smoking Cigarettes.  He has a 10 pack-year smoking history. He has never used smokeless tobacco. He reports that he drinks alcohol. He reports that he does not use illicit drugs. family history includes Colon cancer in his father; Diabetes in his father, mother, and sister; Heart disease in his father; Hypertension in his other; Liver cancer in his paternal uncle; Pancreatitis in his father and paternal uncle; Stomach cancer in his maternal grandfather. There is no history of Esophageal cancer or Rectal cancer. Allergies  Allergen Reactions  . Diphenhydramine Itching, Palpitations and Other (See Comments)    "jittery"  . Other Itching and Swelling    Pecans: itching and swelling of the tongue   . Septra [Sulfamethoxazole-Trimethoprim] Itching  . Colchicine Diarrhea  . Lipitor [Atorvastatin] Other (See Comments)    Muscle cramps   Current Outpatient Prescriptions on File Prior to Visit  Medication Sig Dispense Refill  . amLODipine (NORVASC) 10 MG tablet TAKE ONE TABLET BY MOUTH DAILY 90 tablet 2  . aspirin 81 MG tablet     . atorvastatin (LIPITOR) 20 MG tablet Take 1 tablet (20 mg total) by mouth daily. 90 tablet 3  . cetirizine (  ZYRTEC) 10 MG tablet Take 1 tablet (10 mg total) by mouth daily. 30 tablet 11  . citalopram (CELEXA) 40 MG tablet Take 1 tablet (40 mg total) by mouth daily. 90 tablet 3  . clonazePAM (KLONOPIN) 0.5 MG tablet TAKE 1 TABLET BY MOUTH TWICE DAILY AS NEEDED FOR ANXIETY 60 tablet 1  . cyclobenzaprine (FLEXERIL) 10 MG tablet Take 1 tablet (10 mg total) by mouth 2 (two) times daily as needed for muscle spasms. 20 tablet 0  . cyclobenzaprine (FLEXERIL) 10 MG tablet TAKE 1/2 TO 1 TAB BY MOUTH THREE TIMES PER DAY FOR MUSCLE SPASMS 90 tablet 0  . fenofibrate (TRICOR) 145 MG tablet TAKE ONE TABLET BY MOUTH DAILY 90  tablet 3  . fluticasone (FLONASE) 50 MCG/ACT nasal spray Place 2 sprays into both nostrils daily. 16 g 2  . furosemide (LASIX) 20 MG tablet Take 1 tablet (20 mg total) by mouth daily. 90 tablet 3  . ketoconazole (NIZORAL) 2 % cream Apply 1 application topically daily. 30 g 1  . lisinopril (PRINIVIL,ZESTRIL) 20 MG tablet Take 1 tablet (20 mg total) by mouth 2 (two) times daily. 180 tablet 3  . methocarbamol (ROBAXIN) 500 MG tablet   0  . oxyCODONE-acetaminophen (PERCOCET) 10-325 MG per tablet Take 1 tablet by mouth every 6 (six) hours as needed for pain. To fill September 30, 2014 120 tablet 0  . potassium chloride (K-DUR) 10 MEQ tablet TAKE ONE TABLET BY MOUTH DAILY 90 tablet 3  . rosuvastatin (CRESTOR) 20 MG tablet Take 1 tablet (20 mg total) by mouth daily. 90 tablet 3  . sulfamethoxazole-trimethoprim (BACTRIM DS) 800-160 MG per tablet Take 1 tablet by mouth 2 (two) times daily. 20 tablet 0  . tadalafil (CIALIS) 20 MG tablet Take 1 tablet (20 mg total) by mouth daily as needed for erectile dysfunction. 5 tablet 11  . VOLTAREN 1 % GEL Apply 2 g topically 4 (four) times daily. 100 g 5  . gabapentin (NEURONTIN) 100 MG capsule     . meloxicam (MOBIC) 15 MG tablet     . predniSONE (DELTASONE) 10 MG tablet Take 1 tablet (10 mg total) by mouth daily. 3 tabs by mouth per day for 3 days, then 2 tabs per day for 3 days, then 1 tab per day for 3 days, then stop (Patient not taking: Reported on 10/28/2014) 18 tablet 0  . [DISCONTINUED] amoxicillin-clarithromycin-lansoprazole (PREVPAC) combo pack Take by mouth 2 (two) times daily. Follow package directions. 1 kit 0   No current facility-administered medications on file prior to visit.    Review of Systems  Constitutional: Negative for unusual diaphoresis or night sweats HENT: Negative for ringing in ear or discharge Eyes: Negative for double vision or worsening visual disturbance.  Respiratory: Negative for choking and stridor.   Gastrointestinal: Negative for  vomiting or other signifcant bowel change Genitourinary: Negative for hematuria or change in urine volume.  Musculoskeletal: Negative for other MSK pain or swelling Skin: Negative for color change and worsening wound.  Neurological: Negative for tremors and numbness other than noted  Psychiatric/Behavioral: Negative for decreased concentration or agitation other than above       Objective:   Physical Exam BP 152/96 mmHg  Pulse 80  Temp(Src) 99.1 F (37.3 C) (Oral)  Ht '5\' 11"'  (1.803 m)  Wt 257 lb (116.574 kg)  BMI 35.86 kg/m2  SpO2 97% VS noted, mild ill Constitutional: Pt appears in no significant distress HENT: Head: NCAT.  Right Ear: External ear normal.  Left Ear: External ear normal.  Eyes: . Pupils are equal, round, and reactive to light. Conjunctivae and EOM are normal Bilat tm's with mild erythema.  Max sinus areas mild tender.  Pharynx with mild erythema, no exudate Neck: Normal range of motion. Neck supple.  Cardiovascular: Normal rate and regular rhythm.   Pulmonary/Chest: Effort normal and breath sounds without rales or wheezing.  Neurological: Pt is alert. Not confused , motor grossly intact Skin: Skin is warm. No rash, no LE edema Psychiatric: Pt behavior is normal. No agitation.     Assessment & Plan:

## 2014-10-28 NOTE — Progress Notes (Signed)
Pre visit review using our clinic review tool, if applicable. No additional management support is needed unless otherwise documented below in the visit note. 

## 2014-10-28 NOTE — Assessment & Plan Note (Signed)
I have declined further refills on his monthly narcotic for pain control as I have been clear I do not treat chronic pain and have been providing bridge med refills only.  I wrote a note to state this for him as he thinks this might be important. And will ask PCC's to continue to work on his pain management referral

## 2014-10-28 NOTE — Patient Instructions (Addendum)
Please take all new medication as prescribed - the antibiotic  Please never take any sulfa medication in the future  Please continue all other medications as before, and refills have been done if requested.  Please have the pharmacy call with any other refills you may need.  Please keep your appointments with your specialists as you may have planned  You are given the note about the pain management today  You will be contacted regarding the referral for: [facility]  We will send a reminder to the [facility] (patient care coordinator's to work on a pain clinic referral even outside [place])

## 2014-11-10 ENCOUNTER — Telehealth: Payer: Self-pay

## 2014-11-10 NOTE — Telephone Encounter (Signed)
To call the patient back after not following up on AWV; feels sick but is taking meds and thought he would feel better soon. Does not want to schedule apt at this time.

## 2014-11-14 DIAGNOSIS — M4716 Other spondylosis with myelopathy, lumbar region: Secondary | ICD-10-CM | POA: Diagnosis not present

## 2014-11-17 ENCOUNTER — Telehealth: Payer: Self-pay | Admitting: *Deleted

## 2014-11-17 NOTE — Telephone Encounter (Signed)
Left msg on triage Friday afternoon @ 4:45pm stating he would like for Dr. Jenny Reichmann to go ahead and give him the rx for tramadol. At last visit md was going to give him the prescription but pt state he didn't need, but he is still having back pain.MD is out of the office today will hold for his return.....Johny Chess

## 2014-11-18 MED ORDER — TRAMADOL HCL 50 MG PO TABS: 50.0000 mg | ORAL_TABLET | Freq: Three times a day (TID) | ORAL | Status: DC | PRN

## 2014-11-18 NOTE — Telephone Encounter (Signed)
Done hardcopy to Dahlia  

## 2014-11-18 NOTE — Telephone Encounter (Signed)
Pls advise msg below...Harold [name]

## 2014-11-19 NOTE — Telephone Encounter (Signed)
Called pt inform md ok tramdol refill verified which pharmacy pt use fax script to [facility]...Harold [name]

## 2014-11-25 ENCOUNTER — Other Ambulatory Visit: Payer: Self-pay | Admitting: Internal Medicine

## 2014-11-28 DIAGNOSIS — M1711 Unilateral primary osteoarthritis, right knee: Secondary | ICD-10-CM | POA: Diagnosis not present

## 2014-12-05 ENCOUNTER — Telehealth: Payer: Self-pay | Admitting: Internal Medicine

## 2014-12-05 DIAGNOSIS — M1711 Unilateral primary osteoarthritis, right knee: Secondary | ICD-10-CM | POA: Diagnosis not present

## 2014-12-05 NOTE — Telephone Encounter (Signed)
Received records from [place] forwarded 2 pages to Dr. Cathlean Cower 12/05/14 fbg.

## 2014-12-24 DIAGNOSIS — M1711 Unilateral primary osteoarthritis, right knee: Secondary | ICD-10-CM | POA: Diagnosis not present

## 2014-12-31 DIAGNOSIS — M1711 Unilateral primary osteoarthritis, right knee: Secondary | ICD-10-CM | POA: Diagnosis not present

## 2015-01-09 DIAGNOSIS — M1711 Unilateral primary osteoarthritis, right knee: Secondary | ICD-10-CM | POA: Diagnosis not present

## 2015-01-20 ENCOUNTER — Other Ambulatory Visit: Payer: Self-pay | Admitting: *Deleted

## 2015-01-20 MED ORDER — GABAPENTIN 100 MG PO CAPS: 100.0000 mg | ORAL_CAPSULE | Freq: Every day | ORAL | Status: DC

## 2015-01-21 ENCOUNTER — Ambulatory Visit (INDEPENDENT_AMBULATORY_CARE_PROVIDER_SITE_OTHER): Payer: Commercial Managed Care - HMO | Admitting: Internal Medicine

## 2015-01-21 ENCOUNTER — Encounter: Payer: Self-pay | Admitting: Internal Medicine

## 2015-01-21 ENCOUNTER — Ambulatory Visit: Payer: Commercial Managed Care - HMO | Admitting: Family

## 2015-01-21 VITALS — BP 144/94 | HR 64 | Temp 98.2°F | Resp 18 | Wt 257.0 lb

## 2015-01-21 DIAGNOSIS — Z23 Encounter for immunization: Secondary | ICD-10-CM

## 2015-01-21 DIAGNOSIS — M5416 Radiculopathy, lumbar region: Secondary | ICD-10-CM

## 2015-01-21 MED ORDER — GABAPENTIN 100 MG PO CAPS: 100.0000 mg | ORAL_CAPSULE | Freq: Every day | ORAL | Status: DC

## 2015-01-21 MED ORDER — KETOROLAC TROMETHAMINE 30 MG/ML IJ SOLN
30.0000 mg | Freq: Once | INTRAMUSCULAR | Status: AC
  Administered 2015-01-21: 30 mg via INTRAMUSCULAR

## 2015-01-21 NOTE — Patient Instructions (Signed)
A prescription for gabapentin was sent to your pharmacy.  Take as directed - this medication can be increased slowly.  You received an injection of toradol in the office - this should help with the pain.  A referral was ordered for radiology for a spinal infection.

## 2015-01-21 NOTE — Progress Notes (Signed)
Pre visit review using our clinic review tool, if applicable. No additional management support is needed unless otherwise documented below in the visit note. 

## 2015-01-21 NOTE — Progress Notes (Signed)
Subjective:    Patient ID: Harold [name], male    DOB: Jun 29, 1965, 49 y.o.   MRN: [id number]  HPI He is here today for an acute visit for back pain.    Acute on chronic back pain:  He has chronic lower back pain that typically is across his lower back.  He takes percocet and tramadol for his pain - He takes percocet 1 three times aday.  Takes tramadol as needed. [name]  He will be establishing with the pain clinic 11/23.  His back pain is typically 4-6/10 and does not radiate.  For the past few days he has had increased back pain - "it is doing the same thing as it did last year".  He denies a cause for the increase pain - he denies any new activity or lifting.    The pain is across his lower back, more on the left side.  The pain radiates to both of his regions and is worse on the left. He denies any numbness or tingling in the legs. The left leg does feel slightly weak and needed follow-up night. The pain does not reach his knees. His pain level is currently 8/10.  Last year when this happened he had an injection here and was prescribed gabapentin, both helped. He was also seen at [facility] and had a spinal injection, which helped.    Medications and allergies reviewed with patient and updated if appropriate.  Patient Active Problem List   Diagnosis Date Noted  . Acute sinus infection 06/04/2014  . Lumbar radiculopathy, acute 05/23/2014  . Acute left-sided back pain with sciatica 04/10/2014  . Smoker 04/10/2014  . Rash 02/28/2014  . Cough 01/01/2014  . Grief reaction 11/19/2013  . Glucose intolerance (impaired glucose tolerance) 11/19/2013  . Primary localized osteoarthrosis, lower leg 09/04/2013  . Chronic pain syndrome 07/11/2013  . Hyperlipidemia 07/11/2013  . 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 Gonalgia 11/29/2012  . Preventative health care 09/14/2012  . Allergic rhinitis 09/14/2012  . DDD (degenerative disc disease), lumbar 09/14/2012  . Left sided sciatica  09/14/2012  . Erectile dysfunction 09/14/2012  . Pre-ulcerative corn or callous 09/14/2012  . Hemorrhoids 07/26/2011  . Chronic back pain greater than 3 months duration 09/23/2010  . Abnormal EKG 08/12/2010  . 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 CHF (congestive heart failure) ([place]) 06/29/2010  . Edema 06/10/2010  . Acute idiopathic pericarditis 04/08/2010  . NONSPECIFIC ABNORMAL ELECTROCARDIOGRAM 04/08/2010  . HYPERTRIGLYCERIDEMIA 05/26/2009  . SHOULDER PAIN, LEFT 04/21/2008  . DEPRESSION 03/11/2008  . Essential hypertension 03/11/2008  . Gout, unspecified 05/01/2007  . METABOLIC SYNDROME X 41/28/7867  . OBESITY NOS 10/24/2006    Past Medical History  Diagnosis Date  . Obesity   . Metabolic syndrome   . Hypertension   . Depression   . GERD (gastroesophageal reflux disease)   . Chronic back pain   . History of pancreatitis     a. admx 04-2009.Marland KitchenMarland Kitchen? 2-2 triglycerides  . Hypertriglyceridemia     a. followed by [facility]  . Acute pericarditis     admx 03-26-10 thru 03-29-10; a. echo 03-26-10: EF 55-60%; mild LVH; trivial MR; RVF ok; mild -mod circumferential Eff w/o tamponade  . Constrictive pericarditis   . OSA (obstructive sleep apnea)   . Gout     pt denies  . Family history of malignant neoplasm of gastrointestinal tract   . Allergic rhinitis, cause unspecified 09/14/2012  . DDD (  degenerative disc disease), lumbar 09/14/2012  . Left sided sciatica 09/14/2012  . Erectile dysfunction 09/14/2012  . Hyperlipidemia 07/11/2013    Past Surgical History  Procedure Laterality Date  . Tonsillectomy and adenoidectomy      one tonsil  . Pericardectomy  07/15/2010    Dr. [name]  . Pleurx catheter placement  07/15/2010    Dr. [name]  . Shoulder surgery      right and left shoulders  . Knee surgery      right    Social History   Social History  . Marital Status: Divorced    Spouse Name: N/A  . Number of Children: 1  .  Years of Education: N/A   Occupational History  . disabled    Social History Main Topics  . Smoking status: Current Every Day Smoker -- 0.50 packs/day for 20 years    Types: Cigarettes  . Smokeless tobacco: Never Used  . Alcohol Use: 0.0 oz/week     Comment: several times a week and mostly on the weekends  . Drug Use: No  . Sexual Activity: Not Asked   Other Topics Concern  . None   Social History Narrative   Regular exercise- No    Review of Systems  Gastrointestinal:       No fecal incontinence  Genitourinary: Negative for difficulty urinating (or incontinence).  Musculoskeletal: Positive for back pain.  Neurological: Positive for weakness (mild, left leg). Negative for numbness.       Objective:   Filed Vitals:   01/21/15 0937  BP: 144/94  Pulse: 64  Temp: 98.2 F (36.8 C)  Resp: 18   Filed Weights   01/21/15 0937  Weight: 257 lb (116.574 kg)   Body mass index is 35.86 kg/(m^2).   Physical Exam  Constitutional: He appears well-developed and well-nourished.  Appears to be in moderate amount of pain at times  Musculoskeletal: He exhibits tenderness (across lower back). He exhibits no edema.  Neurological:  Normal sensation and strength in lower extremities   .     Assessment & Plan:   Acute on chronic back pain - Lumbar radiculopathy: Few days of increased back pain, pain is severe and uncontrolled with his current pain medication Experiencing symptoms consistent with lumbar radiculopathy He will be establishing with pain management next month - already has an appt Continue current medication - Percocet 3 times daily and tramadol as needed Start gabapentin 100 mg 3 times daily-advised can titrate this up as tolerated Toradol 30 mg IM here today Referred to  [facility] imaging for possible spinal injection, which has helped him in the past  Follow-up as needed-call questions or concerns

## 2015-01-30 DIAGNOSIS — M1711 Unilateral primary osteoarthritis, right knee: Secondary | ICD-10-CM | POA: Diagnosis not present

## 2015-02-03 ENCOUNTER — Ambulatory Visit (INDEPENDENT_AMBULATORY_CARE_PROVIDER_SITE_OTHER): Payer: Commercial Managed Care - HMO | Admitting: Family

## 2015-02-03 ENCOUNTER — Telehealth: Payer: Self-pay | Admitting: Internal Medicine

## 2015-02-03 ENCOUNTER — Encounter: Payer: Self-pay | Admitting: Family

## 2015-02-03 VITALS — BP 110/88 | HR 65 | Temp 98.1°F | Resp 18 | Ht 71.0 in | Wt 260.0 lb

## 2015-02-03 DIAGNOSIS — J019 Acute sinusitis, unspecified: Secondary | ICD-10-CM | POA: Diagnosis not present

## 2015-02-03 MED ORDER — PROMETHAZINE-CODEINE 6.25-10 MG/5ML PO SYRP: 5.0000 mL | ORAL_SOLUTION | Freq: Four times a day (QID) | ORAL | Status: DC | PRN

## 2015-02-03 MED ORDER — AMOXICILLIN-POT CLAVULANATE 875-125 MG PO TABS: 1.0000 | ORAL_TABLET | Freq: Two times a day (BID) | ORAL | Status: DC

## 2015-02-03 NOTE — Telephone Encounter (Signed)
Ok with me 

## 2015-02-03 NOTE — Assessment & Plan Note (Signed)
Symptoms and exam consistent with bacterial sinusitis. Start Augmentin. Start promethazine with codeine as needed for cough and sleep. Continue over-the-counter medications as needed for symptom relief and supportive care. Follow-up if symptoms worsen or fail to improve.

## 2015-02-03 NOTE — Progress Notes (Signed)
Pre visit review using our clinic review tool, if applicable. No additional management support is needed unless otherwise documented below in the visit note. 

## 2015-02-03 NOTE — Progress Notes (Signed)
Subjective:    Patient ID: Harold [name], male    DOB: 31-Oct-1965, 48 y.o.   MRN: [id number]  Chief Complaint  Patient presents with  . Sinus Problem    Has sinus headache, ear pain in both ears and popping, productive cough at times, chills and body aches    HPI:  Harold [name] is a 49 y.o. male who  has a past medical history of Obesity; Metabolic syndrome; Hypertension; Depression; GERD (gastroesophageal reflux disease); Chronic back pain; History of pancreatitis; Hypertriglyceridemia; Acute pericarditis; Constrictive pericarditis; OSA (obstructive sleep apnea); Gout; Family history of malignant neoplasm of gastrointestinal tract; Allergic rhinitis, cause unspecified (09/14/2012); DDD (degenerative disc disease), lumbar (09/14/2012); Left sided sciatica (09/14/2012); Erectile dysfunction (09/14/2012); and Hyperlipidemia (07/11/2013). and presents today for an acute office visit.   Associated symptoms of sinus headache, ear pain, productive cough with purulent sputum, chills, and body aches with been going on for approximately 3 days. Modifying factors include Dayquil and ibuprofen which provides temporary relief. Severity of the cough is enough to keep him up at night. Denies any recent antibiotic usage.   Allergies  Allergen Reactions  . Diphenhydramine Itching, Palpitations and Other (See Comments)    "jittery"  . Other Itching and Swelling    Pecans: itching and swelling of the tongue   . Septra [Sulfamethoxazole-Trimethoprim] Itching  . Colchicine Diarrhea  . Lipitor [Atorvastatin] Other (See Comments)    Muscle cramps    r Current Outpatient Prescriptions on File Prior to Visit  Medication Sig Dispense Refill  . amLODipine (NORVASC) 10 MG tablet TAKE ONE TABLET BY MOUTH DAILY 90 tablet 2  . amoxicillin (AMOXIL) 500 MG capsule Take 2 capsules (1,000 mg total) by mouth 2 (two) times daily. 40 capsule 0  . aspirin 81 MG tablet     . atorvastatin (LIPITOR) 20 MG tablet Take 1  tablet (20 mg total) by mouth daily. 90 tablet 3  . cetirizine (ZYRTEC) 10 MG tablet Take 1 tablet (10 mg total) by mouth daily. 30 tablet 11  . citalopram (CELEXA) 40 MG tablet Take 1 tablet (40 mg total) by mouth daily. 90 tablet 3  . clonazePAM (KLONOPIN) 0.5 MG tablet TAKE 1 TABLET BY MOUTH TWICE DAILY AS NEEDED FOR ANXIETY 60 tablet 1  . fenofibrate (TRICOR) 145 MG tablet TAKE ONE TABLET BY MOUTH DAILY 90 tablet 2  . fluticasone (FLONASE) 50 MCG/ACT nasal spray Place 2 sprays into both nostrils daily. 16 g 2  . furosemide (LASIX) 20 MG tablet Take 1 tablet (20 mg total) by mouth daily. 90 tablet 3  . gabapentin (NEURONTIN) 100 MG capsule Take 1 capsule (100 mg total) by mouth daily. 90 capsule 3  . ketoconazole (NIZORAL) 2 % cream Apply 1 application topically daily. 30 g 1  . lisinopril (PRINIVIL,ZESTRIL) 20 MG tablet Take 1 tablet (20 mg total) by mouth 2 (two) times daily. 180 tablet 3  . meloxicam (MOBIC) 15 MG tablet     . methocarbamol (ROBAXIN) 500 MG tablet   0  . potassium chloride (K-DUR) 10 MEQ tablet TAKE ONE TABLET BY MOUTH DAILY 90 tablet 2  . rosuvastatin (CRESTOR) 20 MG tablet Take 1 tablet (20 mg total) by mouth daily. 90 tablet 3  . tadalafil (CIALIS) 20 MG tablet Take 1 tablet (20 mg total) by mouth daily as needed for erectile dysfunction. 5 tablet 11  . traMADol (ULTRAM) 50 MG tablet Take 1 tablet (50 mg total) by mouth every 8 (eight) hours as needed.  90 tablet 5  . VOLTAREN 1 % GEL Apply 2 g topically 4 (four) times daily. 100 g 5  . [DISCONTINUED] amoxicillin-clarithromycin-lansoprazole (PREVPAC) combo pack Take by mouth 2 (two) times daily. Follow package directions. 1 kit 0   No current facility-administered medications on file prior to visit.     Review of Systems  Constitutional: Positive for chills. Negative for fever.  HENT: Positive for congestion, ear pain and sinus pressure.   Respiratory: Positive for cough.   Neurological: Positive for headaches.       Objective:    BP 110/88 mmHg  Pulse 65  Temp(Src) 98.1 F (36.7 C) (Oral)  Resp 18  Ht '5\' 11"'  (1.803 m)  Wt 260 lb (117.935 kg)  BMI 36.28 kg/m2  SpO2 99% Nursing note and vital signs reviewed.  Physical Exam  Constitutional: He is oriented to person, place, and time. He appears well-developed and well-nourished.  HENT:  Right Ear: Hearing, tympanic membrane, external ear and ear canal normal.  Left Ear: Hearing, tympanic membrane, external ear and ear canal normal.  Nose: Right sinus exhibits maxillary sinus tenderness and frontal sinus tenderness. Left sinus exhibits maxillary sinus tenderness and frontal sinus tenderness.  Mouth/Throat: Uvula is midline and mucous membranes are normal. Posterior oropharyngeal erythema present.  Neck: Neck supple.  Cardiovascular: Normal rate, regular rhythm, normal heart sounds and intact distal pulses.   Pulmonary/Chest: Effort normal and breath sounds normal.  Neurological: He is alert and oriented to person, place, and time.  Skin: Skin is warm and dry.       Assessment & Plan:   Problem List Items Addressed This Visit      Respiratory   Acute sinus infection - Primary    Symptoms and exam consistent with bacterial sinusitis. Start Augmentin. Start promethazine with codeine as needed for cough and sleep. Continue over-the-counter medications as needed for symptom relief and supportive care. Follow-up if symptoms worsen or fail to improve.      Relevant Medications   amoxicillin-clavulanate (AUGMENTIN) 875-125 MG tablet   promethazine-codeine (PHENERGAN WITH CODEINE) 6.25-10 MG/5ML syrup

## 2015-02-03 NOTE — Patient Instructions (Signed)
Thank you for choosing [facility].  Summary/Instructions:  Your prescription(s) have been submitted to your pharmacy or been printed and provided for you. Please take as directed and contact our office if you believe you are having problem(s) with the medication(s) or have any questions.  If your symptoms worsen or fail to improve, please contact our office for further instruction, or in case of emergency go directly to the emergency room at the closest medical facility.   General Recommendations:    Please drink plenty of fluids.  Get plenty of rest   Sleep in humidified air  Use saline nasal sprays  Netti pot   OTC Medications:  Decongestants - helps relieve congestion   Flonase (generic fluticasone) or Nasacort (generic triamcinolone) - please make sure to use the "cross-over" technique at a 45 degree angle towards the opposite eye as opposed to straight up the nasal passageway.   If you have HIGH BLOOD PRESSURE - Coricidin HBP; AVOID any product that is -D as this contains pseudoephedrine which may increase your blood pressure.  Afrin (oxymetazoline) every 6-8 hours for up to 3 days.   Allergies - helps relieve runny nose, itchy eyes and sneezing   Claritin (generic loratidine), Allegra (fexofenidine), or Zyrtec (generic cyrterizine) for runny nose. These medications should not cause drowsiness.  Note - Benadryl (generic diphenhydramine) may be used however may cause drowsiness  Cough -   Delsym or Robitussin (generic dextromethorphan)  Expectorants - helps loosen mucus to ease removal   Mucinex (generic guaifenesin) as directed on the package.  Headaches / General Aches   Tylenol (generic acetaminophen) - DO NOT EXCEED 3 grams (3,000 mg) in a 24 hour time period  Advil/Motrin (generic ibuprofen)   Sore Throat -   Salt water gargle   Chloraseptic (generic benzocaine) spray or lozenges / Sucrets (generic dyclonine)    Sinusitis Sinusitis is  redness, soreness, and inflammation of the paranasal sinuses. Paranasal sinuses are air pockets within the bones of your face (beneath the eyes, the middle of the forehead, or above the eyes). In healthy paranasal sinuses, mucus is able to drain out, and air is able to circulate through them by way of your nose. However, when your paranasal sinuses are inflamed, mucus and air can become trapped. This can allow bacteria and other germs to grow and cause infection. Sinusitis can develop quickly and last only a short time (acute) or continue over a long period (chronic). Sinusitis that lasts for more than 12 weeks is considered chronic.  CAUSES  Causes of sinusitis include:  Allergies.  Structural abnormalities, such as displacement of the cartilage that separates your nostrils (deviated septum), which can decrease the air flow through your nose and sinuses and affect sinus drainage.  Functional abnormalities, such as when the small hairs (cilia) that line your sinuses and help remove mucus do not work properly or are not present. SIGNS AND SYMPTOMS  Symptoms of acute and chronic sinusitis are the same. The primary symptoms are pain and pressure around the affected sinuses. Other symptoms include:  Upper toothache.  Earache.  Headache.  Bad breath.  Decreased sense of smell and taste.  A cough, which worsens when you are lying flat.  Fatigue.  Fever.  Thick drainage from your nose, which often is green and may contain pus (purulent).  Swelling and warmth over the affected sinuses. DIAGNOSIS  Your health care provider will perform a physical exam. During the exam, your health care provider may:  Look in your   nose for signs of abnormal growths in your nostrils (nasal polyps).  Tap over the affected sinus to check for signs of infection.  View the inside of your sinuses (endoscopy) using an imaging device that has a light attached (endoscope). If your health care provider suspects  that you have chronic sinusitis, one or more of the following tests may be recommended:  Allergy tests.  Nasal culture. A sample of mucus is taken from your nose, sent to a lab, and screened for bacteria.  Nasal cytology. A sample of mucus is taken from your nose and examined by your health care provider to determine if your sinusitis is related to an allergy. TREATMENT  Most cases of acute sinusitis are related to a viral infection and will resolve on their own within 10 days. Sometimes medicines are prescribed to help relieve symptoms (pain medicine, decongestants, nasal steroid sprays, or saline sprays).  However, for sinusitis related to a bacterial infection, your health care provider will prescribe antibiotic medicines. These are medicines that will help kill the bacteria causing the infection.  Rarely, sinusitis is caused by a fungal infection. In theses cases, your health care provider will prescribe antifungal medicine. For some cases of chronic sinusitis, surgery is needed. Generally, these are cases in which sinusitis recurs more than 3 times per year, despite other treatments. HOME CARE INSTRUCTIONS   Drink plenty of water. Water helps thin the mucus so your sinuses can drain more easily.  Use a humidifier.  Inhale steam 3 to 4 times a day (for example, sit in the bathroom with the shower running).  Apply a warm, moist washcloth to your face 3 to 4 times a day, or as directed by your health care provider.  Use saline nasal sprays to help moisten and clean your sinuses.  Take medicines only as directed by your health care provider.  If you were prescribed either an antibiotic or antifungal medicine, finish it all even if you start to feel better. SEEK IMMEDIATE MEDICAL CARE IF:  You have increasing pain or severe headaches.  You have nausea, vomiting, or drowsiness.  You have swelling around your face.  You have vision problems.  You have a stiff neck.  You have  difficulty breathing. MAKE SURE YOU:   Understand these instructions.  Will watch your condition.  Will get help right away if you are not doing well or get worse. Document Released: 03/14/2005 Document Revised: 07/29/2013 Document Reviewed: 03/29/2011 [employer] Patient Information 2015 [employer]. This information is not intended to replace advice given to you by your health care provider. Make sure you discuss any questions you have with your health care provider.  

## 2015-02-03 NOTE — Telephone Encounter (Signed)
Is requesting to transfer from [facility] to [facility].  Please advise.

## 2015-02-03 NOTE — Telephone Encounter (Signed)
Notified patient.

## 2015-02-12 ENCOUNTER — Other Ambulatory Visit: Payer: Self-pay | Admitting: Internal Medicine

## 2015-02-12 DIAGNOSIS — M5416 Radiculopathy, lumbar region: Secondary | ICD-10-CM

## 2015-02-17 ENCOUNTER — Encounter: Payer: Self-pay | Admitting: Family

## 2015-02-17 ENCOUNTER — Other Ambulatory Visit (INDEPENDENT_AMBULATORY_CARE_PROVIDER_SITE_OTHER): Payer: Commercial Managed Care - HMO

## 2015-02-17 ENCOUNTER — Ambulatory Visit (INDEPENDENT_AMBULATORY_CARE_PROVIDER_SITE_OTHER): Payer: Commercial Managed Care - HMO | Admitting: Family

## 2015-02-17 ENCOUNTER — Telehealth: Payer: Self-pay | Admitting: Family

## 2015-02-17 VITALS — BP 138/80 | HR 67 | Temp 97.8°F | Ht 71.0 in | Wt 258.5 lb

## 2015-02-17 DIAGNOSIS — N529 Male erectile dysfunction, unspecified: Secondary | ICD-10-CM | POA: Diagnosis not present

## 2015-02-17 DIAGNOSIS — Q828 Other specified congenital malformations of skin: Secondary | ICD-10-CM | POA: Insufficient documentation

## 2015-02-17 DIAGNOSIS — R351 Nocturia: Secondary | ICD-10-CM

## 2015-02-17 DIAGNOSIS — M5136 Other intervertebral disc degeneration, lumbar region: Secondary | ICD-10-CM | POA: Diagnosis not present

## 2015-02-17 HISTORY — DX: Nocturia: R35.1

## 2015-02-17 LAB — HEMOGLOBIN A1C: Hgb A1c MFr Bld: 7.5 % — ABNORMAL HIGH (ref 4.6–6.5)

## 2015-02-17 LAB — PSA: PSA: 2 ng/mL (ref 0.10–4.00)

## 2015-02-17 MED ORDER — METHOCARBAMOL 750 MG PO TABS: 750.0000 mg | ORAL_TABLET | Freq: Three times a day (TID) | ORAL | Status: DC | PRN

## 2015-02-17 MED ORDER — VOLTAREN 1 % TD GEL: 2.0000 g | Freq: Four times a day (QID) | TRANSDERMAL | Status: DC

## 2015-02-17 NOTE — Assessment & Plan Note (Signed)
Nocturia of possible relation to type 2 diabetes or benign prostate hypertrophy. Obtain A1c to check current diabetes status. Obtain BPH check prostate. Follow-up pending lab results.

## 2015-02-17 NOTE — Patient Instructions (Signed)
Thank you for choosing [facility].  Summary/Instructions:  Your prescription(s) have been submitted to your pharmacy or been printed and provided for you. Please take as directed and contact our office if you believe you are having problem(s) with the medication(s) or have any questions.  If your symptoms worsen or fail to improve, please contact our office for further instruction, or in case of emergency go directly to the emergency room at the closest medical facility.   Follow up with pain management as scheduled.   Low Back Sprain With Rehab A sprain is an injury in which a ligament is torn. The ligaments of the lower back are vulnerable to sprains. However, they are strong and require great force to be injured. These ligaments are important for stabilizing the spinal column. Sprains are classified into three categories. Grade 1 sprains cause pain, but the tendon is not lengthened. Grade 2 sprains include a lengthened ligament, due to the ligament being stretched or partially ruptured. With grade 2 sprains there is still function, although the function may be decreased. Grade 3 sprains involve a complete tear of the tendon or muscle, and function is usually impaired. SYMPTOMS   Severe pain in the lower back.  Sometimes, a feeling of a "pop," "snap," or tear, at the time of injury.  Tenderness and sometimes swelling at the injury site.  Uncommonly, bruising (contusion) within 48 hours of injury.  Muscle spasms in the back. CAUSES  Low back sprains occur when a force is placed on the ligaments that is greater than they can handle. Common causes of injury include:  Performing a stressful act while off-balance.  Repetitive stressful activities that involve movement of the lower back.  Direct hit (trauma) to the lower back. RISK INCREASES WITH:  Contact sports (football, wrestling).  Collisions (major skiing accidents).  Sports that require throwing or lifting (baseball,  weightlifting).  Sports involving twisting of the spine (gymnastics, diving, tennis, golf).  Poor strength and flexibility.  Inadequate protection.  Previous back injury or surgery (especially fusion). PREVENTION  Wear properly fitted and padded protective equipment.  Warm up and stretch properly before activity.  Allow for adequate recovery between workouts.  Maintain physical fitness:  Strength, flexibility, and endurance.  Cardiovascular fitness.  Maintain a healthy body weight. PROGNOSIS  If treated properly, low back sprains usually heal with non-surgical treatment. The length of time for healing depends on the severity of the injury.  RELATED COMPLICATIONS   Recurring symptoms, resulting in a chronic problem.  Chronic inflammation and pain in the low back.  Delayed healing or resolution of symptoms, especially if activity is resumed too soon.  Prolonged impairment.  Unstable or arthritic joints of the low back. TREATMENT  Treatment first involves the use of ice and medicine, to reduce pain and inflammation. The use of strengthening and stretching exercises may help reduce pain with activity. These exercises may be performed at home or with a therapist. Severe injuries may require referral to a therapist for further evaluation and treatment, such as ultrasound. Your caregiver may advise that you wear a back brace or corset, to help reduce pain and discomfort. Often, prolonged bed rest results in greater harm then benefit. Corticosteroid injections may be recommended. However, these should be reserved for the most serious cases. It is important to avoid using your back when lifting objects. At night, sleep on your back on a firm mattress, with a pillow placed under your knees. If non-surgical treatment is unsuccessful, surgery may be needed.  MEDICATION   If pain medicine is needed, nonsteroidal anti-inflammatory medicines (aspirin and ibuprofen), or other minor pain  relievers (acetaminophen), are often advised.  Do not take pain medicine for 7 days before surgery.  Prescription pain relievers may be given, if your caregiver thinks they are needed. Use only as directed and only as much as you need.  Ointments applied to the skin may be helpful.  Corticosteroid injections may be given by your caregiver. These injections should be reserved for the most serious cases, because they may only be given a certain number of times. HEAT AND COLD  Cold treatment (icing) should be applied for 10 to 15 minutes every 2 to 3 hours for inflammation and pain, and immediately after activity that aggravates your symptoms. Use ice packs or an ice massage.  Heat treatment may be used before performing stretching and strengthening activities prescribed by your caregiver, physical therapist, or athletic trainer. Use a heat pack or a warm water soak. SEEK MEDICAL CARE IF:   Symptoms get worse or do not improve in 2 to 4 weeks, despite treatment.  You develop numbness or weakness in either leg.  You lose bowel or bladder function.  Any of the following occur after surgery: fever, increased pain, swelling, redness, drainage of fluids, or bleeding in the affected area.  New, unexplained symptoms develop. (Drugs used in treatment may produce side effects.) EXERCISES  RANGE OF MOTION (ROM) AND STRETCHING EXERCISES - Low Back Sprain Most people with lower back pain will find that their symptoms get worse with excessive bending forward (flexion) or arching at the lower back (extension). The exercises that will help resolve your symptoms will focus on the opposite motion.  Your physician, physical therapist or athletic trainer will help you determine which exercises will be most helpful to resolve your lower back pain. Do not complete any exercises without first consulting with your caregiver. Discontinue any exercises which make your symptoms worse, until you speak to your  caregiver. If you have pain, numbness or tingling which travels down into your buttocks, leg or foot, the goal of the therapy is for these symptoms to move closer to your back and eventually resolve. Sometimes, these leg symptoms will get better, but your lower back pain may worsen. This is often an indication of progress in your rehabilitation. Be very alert to any changes in your symptoms and the activities in which you participated in the 24 hours prior to the change. Sharing this information with your caregiver will allow him or her to most efficiently treat your condition. These exercises may help you when beginning to rehabilitate your injury. Your symptoms may resolve with or without further involvement from your physician, physical therapist or athletic trainer. While completing these exercises, remember:   Restoring tissue flexibility helps normal motion to return to the joints. This allows healthier, less painful movement and activity.  An effective stretch should be held for at least 30 seconds.  A stretch should never be painful. You should only feel a gentle lengthening or release in the stretched tissue. FLEXION RANGE OF MOTION AND STRETCHING EXERCISES: STRETCH - Flexion, Single Knee to Chest   Lie on a firm bed or floor with both legs extended in front of you.  Keeping one leg in contact with the floor, bring your opposite knee to your chest. Hold your leg in place by either grabbing behind your thigh or at your knee.  Pull until you feel a gentle stretch in your low back.  Hold __________ seconds.  Slowly release your grasp and repeat the exercise with the opposite side. Repeat __________ times. Complete this exercise __________ times per day.  STRETCH - Flexion, Double Knee to Chest  Lie on a firm bed or floor with both legs extended in front of you.  Keeping one leg in contact with the floor, bring your opposite knee to your chest.  Tense your stomach muscles to support  your back and then lift your other knee to your chest. Hold your legs in place by either grabbing behind your thighs or at your knees.  Pull both knees toward your chest until you feel a gentle stretch in your low back. Hold __________ seconds.  Tense your stomach muscles and slowly return one leg at a time to the floor. Repeat __________ times. Complete this exercise __________ times per day.  STRETCH - Low Trunk Rotation  Lie on a firm bed or floor. Keeping your legs in front of you, bend your knees so they are both pointed toward the ceiling and your feet are flat on the floor.  Extend your arms out to the side. This will stabilize your upper body by keeping your shoulders in contact with the floor.  Gently and slowly drop both knees together to one side until you feel a gentle stretch in your low back. Hold for __________ seconds.  Tense your stomach muscles to support your lower back as you bring your knees back to the starting position. Repeat the exercise to the other side. Repeat __________ times. Complete this exercise __________ times per day  EXTENSION RANGE OF MOTION AND FLEXIBILITY EXERCISES: STRETCH - Extension, Prone on Elbows   Lie on your stomach on the floor, a bed will be too soft. Place your palms about shoulder width apart and at the height of your head.  Place your elbows under your shoulders. If this is too painful, stack pillows under your chest.  Allow your body to relax so that your hips drop lower and make contact more completely with the floor.  Hold this position for __________ seconds.  Slowly return to lying flat on the floor. Repeat __________ times. Complete this exercise __________ times per day.  RANGE OF MOTION - Extension, Prone Press Ups  Lie on your stomach on the floor, a bed will be too soft. Place your palms about shoulder width apart and at the height of your head.  Keeping your back as relaxed as possible, slowly straighten your elbows  while keeping your hips on the floor. You may adjust the placement of your hands to maximize your comfort. As you gain motion, your hands will come more underneath your shoulders.  Hold this position __________ seconds.  Slowly return to lying flat on the floor. Repeat __________ times. Complete this exercise __________ times per day.  RANGE OF MOTION- Quadruped, Neutral Spine   Assume a hands and knees position on a firm surface. Keep your hands under your shoulders and your knees under your hips. You may place padding under your knees for comfort.  Drop your head and point your tailbone toward the ground below you. This will round out your lower back like an angry cat. Hold this position for __________ seconds.  Slowly lift your head and release your tail bone so that your back sags into a large arch, like an old horse.  Hold this position for __________ seconds.  Repeat this until you feel limber in your low back.  Now, find your "sweet spot."  This will be the most comfortable position somewhere between the two previous positions. This is your neutral spine. Once you have found this position, tense your stomach muscles to support your low back.  Hold this position for __________ seconds. Repeat __________ times. Complete this exercise __________ times per day.  STRENGTHENING EXERCISES - Low Back Sprain These exercises may help you when beginning to rehabilitate your injury. These exercises should be done near your "sweet spot." This is the neutral, low-back arch, somewhere between fully rounded and fully arched, that is your least painful position. When performed in this safe range of motion, these exercises can be used for people who have either a flexion or extension based injury. These exercises may resolve your symptoms with or without further involvement from your physician, physical therapist or athletic trainer. While completing these exercises, remember:   Muscles can gain both  the endurance and the strength needed for everyday activities through controlled exercises.  Complete these exercises as instructed by your physician, physical therapist or athletic trainer. Increase the resistance and repetitions only as guided.  You may experience muscle soreness or fatigue, but the pain or discomfort you are trying to eliminate should never worsen during these exercises. If this pain does worsen, stop and make certain you are following the directions exactly. If the pain is still present after adjustments, discontinue the exercise until you can discuss the trouble with your caregiver. STRENGTHENING - Deep Abdominals, Pelvic Tilt   Lie on a firm bed or floor. Keeping your legs in front of you, bend your knees so they are both pointed toward the ceiling and your feet are flat on the floor.  Tense your lower abdominal muscles to press your low back into the floor. This motion will rotate your pelvis so that your tail bone is scooping upwards rather than pointing at your feet or into the floor. With a gentle tension and even breathing, hold this position for __________ seconds. Repeat __________ times. Complete this exercise __________ times per day.  STRENGTHENING - Abdominals, Crunches   Lie on a firm bed or floor. Keeping your legs in front of you, bend your knees so they are both pointed toward the ceiling and your feet are flat on the floor. Cross your arms over your chest.  Slightly tip your chin down without bending your neck.  Tense your abdominals and slowly lift your trunk high enough to just clear your shoulder blades. Lifting higher can put excessive stress on the lower back and does not further strengthen your abdominal muscles.  Control your return to the starting position. Repeat __________ times. Complete this exercise __________ times per day.  STRENGTHENING - Quadruped, Opposite UE/LE Lift   Assume a hands and knees position on a firm surface. Keep your hands  under your shoulders and your knees under your hips. You may place padding under your knees for comfort.  Find your neutral spine and gently tense your abdominal muscles so that you can maintain this position. Your shoulders and hips should form a rectangle that is parallel with the floor and is not twisted.  Keeping your trunk steady, lift your right hand no higher than your shoulder and then your left leg no higher than your hip. Make sure you are not holding your breath. Hold this position for __________ seconds.  Continuing to keep your abdominal muscles tense and your back steady, slowly return to your starting position. Repeat with the opposite arm and leg. Repeat __________ times. Complete this  exercise __________ times per day.  STRENGTHENING - Abdominals and Quadriceps, Straight Leg Raise   Lie on a firm bed or floor with both legs extended in front of you.  Keeping one leg in contact with the floor, bend the other knee so that your foot can rest flat on the floor.  Find your neutral spine, and tense your abdominal muscles to maintain your spinal position throughout the exercise.  Slowly lift your straight leg off the floor about 6 inches for a count of 15, making sure to not hold your breath.  Still keeping your neutral spine, slowly lower your leg all the way to the floor. Repeat this exercise with each leg __________ times. Complete this exercise __________ times per day. POSTURE AND BODY MECHANICS CONSIDERATIONS - Low Back Sprain Keeping correct posture when sitting, standing or completing your activities will reduce the stress put on different body tissues, allowing injured tissues a chance to heal and limiting painful experiences. The following are general guidelines for improved posture. Your physician or physical therapist will provide you with any instructions specific to your needs. While reading these guidelines, remember:  The exercises prescribed by your provider will  help you have the flexibility and strength to maintain correct postures.  The correct posture provides the best environment for your joints to work. All of your joints have less wear and tear when properly supported by a spine with good posture. This means you will experience a healthier, less painful body.  Correct posture must be practiced with all of your activities, especially prolonged sitting and standing. Correct posture is as important when doing repetitive low-stress activities (typing) as it is when doing a single heavy-load activity (lifting). RESTING POSITIONS Consider which positions are most painful for you when choosing a resting position. If you have pain with flexion-based activities (sitting, bending, stooping, squatting), choose a position that allows you to rest in a less flexed posture. You would want to avoid curling into a fetal position on your side. If your pain worsens with extension-based activities (prolonged standing, working overhead), avoid resting in an extended position such as sleeping on your stomach. Most people will find more comfort when they rest with their spine in a more neutral position, neither too rounded nor too arched. Lying on a non-sagging bed on your side with a pillow between your knees, or on your back with a pillow under your knees will often provide some relief. Keep in mind, being in any one position for a prolonged period of time, no matter how correct your posture, can still lead to stiffness. PROPER SITTING POSTURE In order to minimize stress and discomfort on your spine, you must sit with correct posture. Sitting with good posture should be effortless for a healthy body. Returning to good posture is a gradual process. Many people can work toward this most comfortably by using various supports until they have the flexibility and strength to maintain this posture on their own. When sitting with proper posture, your ears will fall over your shoulders  and your shoulders will fall over your hips. You should use the back of the chair to support your upper back. Your lower back will be in a neutral position, just slightly arched. You may place a small pillow or folded towel at the base of your lower back for  support.  When working at a desk, create an environment that supports good, upright posture. Without extra support, muscles tire, which leads to excessive strain on joints and  other tissues. Keep these recommendations in mind: CHAIR:  A chair should be able to slide under your desk when your back makes contact with the back of the chair. This allows you to work closely.  The chair's height should allow your eyes to be level with the upper part of your monitor and your hands to be slightly lower than your elbows. BODY POSITION  Your feet should make contact with the floor. If this is not possible, use a foot rest.  Keep your ears over your shoulders. This will reduce stress on your neck and low back. INCORRECT SITTING POSTURES  If you are feeling tired and unable to assume a healthy sitting posture, do not slouch or slump. This puts excessive strain on your back tissues, causing more damage and pain. Healthier options include:  Using more support, like a lumbar pillow.  Switching tasks to something that requires you to be upright or walking.  Talking a brief walk.  Lying down to rest in a neutral-spine position. PROLONGED STANDING WHILE SLIGHTLY LEANING FORWARD  When completing a task that requires you to lean forward while standing in one place for a long time, place either foot up on a stationary 2-4 inch high object to help maintain the best posture. When both feet are on the ground, the lower back tends to lose its slight inward curve. If this curve flattens (or becomes too large), then the back and your other joints will experience too much stress, tire more quickly, and can cause pain. CORRECT STANDING POSTURES Proper standing  posture should be assumed with all daily activities, even if they only take a few moments, like when brushing your teeth. As in sitting, your ears should fall over your shoulders and your shoulders should fall over your hips. You should keep a slight tension in your abdominal muscles to brace your spine. Your tailbone should point down to the ground, not behind your body, resulting in an over-extended swayback posture.  INCORRECT STANDING POSTURES  Common incorrect standing postures include a forward head, locked knees and/or an excessive swayback. WALKING Walk with an upright posture. Your ears, shoulders and hips should all line-up. PROLONGED ACTIVITY IN A FLEXED POSITION When completing a task that requires you to bend forward at your waist or lean over a low surface, try to find a way to stabilize 3 out of 4 of your limbs. You can place a hand or elbow on your thigh or rest a knee on the surface you are reaching across. This will provide you more stability, so that your muscles do not tire as quickly. By keeping your knees relaxed, or slightly bent, you will also reduce stress across your lower back. CORRECT LIFTING TECHNIQUES DO :  Assume a wide stance. This will provide you more stability and the opportunity to get as close as possible to the object which you are lifting.  Tense your abdominals to brace your spine. Bend at the knees and hips. Keeping your back locked in a neutral-spine position, lift using your leg muscles. Lift with your legs, keeping your back straight.  Test the weight of unknown objects before attempting to lift them.  Try to keep your elbows locked down at your sides in order get the best strength from your shoulders when carrying an object.  Always ask for help when lifting heavy or awkward objects. INCORRECT LIFTING TECHNIQUES DO NOT:   Lock your knees when lifting, even if it is a small object.  Bend and twist.  Pivot at your feet or move your feet when needing  to change directions.  Assume that you can safely pick up even a paperclip without proper posture.   This information is not intended to replace advice given to you by your health care provider. Make sure you discuss any questions you have with your health care provider.   Document Released: 03/14/2005 Document Revised: 04/04/2014 Document Reviewed: 06/26/2008 Elsevier Interactive Patient Education [employer].

## 2015-02-17 NOTE — Assessment & Plan Note (Signed)
Continues to experience associated symptom of erectile dysfunction and not currently maintained on medication. Discussed possibilities of sildenafil and Cialis. Patient will like to hold off on medication at this time. Information regarding discounted sildenafil provided through [facility].

## 2015-02-17 NOTE — Progress Notes (Signed)
Pre visit review using our clinic review tool, if applicable. No additional management support is needed unless otherwise documented below in the visit note. 

## 2015-02-17 NOTE — Assessment & Plan Note (Signed)
Left-sided lower back pain with left leg radiculopathy consistent with possible disc pathology and/or degenerative disc disease. Continue current dosage of gabapentin with referral to pain management pending starting tomorrow. Home exercise therapy with exercises provided. Follow-up with primary care as needed.

## 2015-02-17 NOTE — Telephone Encounter (Signed)
Please inform patient that his A1c is 7.5 indicating that he does have diabetes. To control this I have sent in medication as we discussed called Metformin that is taken twice daily. There are some side effects when first starting the medication including some upset stomach and loose stools. This usually goes away within the first several days of taking the medication. Please have him follow up in 2 weeks for the diabetes prevention. Also his prostate function looked normal.

## 2015-02-17 NOTE — Progress Notes (Signed)
Subjective:    Patient ID: Harold [name], male    DOB: [birth year], 49 y.o.   MRN: [id number]  Chief Complaint  Patient presents with  . Follow-up    Back pain, erectile dysfunction, skin tags    HPI:  Harold [name] is a 49 y.o. male who  has a past medical history of Obesity; Metabolic syndrome; Hypertension; Depression; GERD (gastroesophageal reflux disease); Chronic back pain; History of pancreatitis; Hypertriglyceridemia; Acute pericarditis; Constrictive pericarditis; OSA (obstructive sleep apnea); Gout; Family history of malignant neoplasm of gastrointestinal tract; Allergic rhinitis, cause unspecified (09/14/2012); DDD (degenerative disc disease), lumbar (09/14/2012); Left sided sciatica (09/14/2012); Erectile dysfunction (09/14/2012); and Hyperlipidemia (07/11/2013). and presents today for an office follow up.   1.) Back pain - Associated symptom of pain located on the left side of his back with radiculopathy on the left side. Starts with pain management tomorrow and is currently take 100 mg of gabapentin daily and reports that this has not helped with his pain. He is also taking robaxin and voltaren gel. Pain is described as horrendous and is severe enough to effect his lifestyle and activities of daily living. Denies changes to bowel/bladder habits or saddle anesthesia.   2.) Skin tags - Associated symptoms of multiple skin tags located under his right axilla have been going on for over 1 year. Described as occasionally uncomfortable and would like to have them removed.   3.) Erectile dysfunction - Associated symptom of erectile dysfunction has been going on for several months and would like to try ED medications to assist with obtaining and maintaining an erection.   4.) Nocturia - Associated symptom of nocturia has been going on for several weeks. Describes having to get up several times per night to go to the bathroom. He was previously diagnosed with diabetes and is not currently  maintained on medications. His previous A1c was 6.5.   Allergies  Allergen Reactions  . Diphenhydramine Itching, Palpitations and Other (See Comments)    "jittery"  . Other Itching and Swelling    Pecans: itching and swelling of the tongue   . Septra [Sulfamethoxazole-Trimethoprim] Itching  . Colchicine Diarrhea  . Lipitor [Atorvastatin] Other (See Comments)    Muscle cramps     Current Outpatient Prescriptions on File Prior to Visit  Medication Sig Dispense Refill  . amLODipine (NORVASC) 10 MG tablet TAKE ONE TABLET BY MOUTH DAILY 90 tablet 2  . aspirin 81 MG tablet     . atorvastatin (LIPITOR) 20 MG tablet Take 1 tablet (20 mg total) by mouth daily. 90 tablet 3  . citalopram (CELEXA) 40 MG tablet Take 1 tablet (40 mg total) by mouth daily. 90 tablet 3  . clonazePAM (KLONOPIN) 0.5 MG tablet TAKE 1 TABLET BY MOUTH TWICE DAILY AS NEEDED FOR ANXIETY 60 tablet 1  . fenofibrate (TRICOR) 145 MG tablet TAKE ONE TABLET BY MOUTH DAILY 90 tablet 2  . furosemide (LASIX) 20 MG tablet Take 1 tablet (20 mg total) by mouth daily. 90 tablet 3  . gabapentin (NEURONTIN) 100 MG capsule Take 1 capsule (100 mg total) by mouth daily. 90 capsule 3  . lisinopril (PRINIVIL,ZESTRIL) 20 MG tablet Take 1 tablet (20 mg total) by mouth 2 (two) times daily. 180 tablet 3  . potassium chloride (K-DUR) 10 MEQ tablet TAKE ONE TABLET BY MOUTH DAILY 90 tablet 2  . promethazine-codeine (PHENERGAN WITH CODEINE) 6.25-10 MG/5ML syrup Take 5 mLs by mouth every 6 (six) hours as needed for cough. 118 mL 0  .  rosuvastatin (CRESTOR) 20 MG tablet Take 1 tablet (20 mg total) by mouth daily. 90 tablet 3  . traMADol (ULTRAM) 50 MG tablet Take 1 tablet (50 mg total) by mouth every 8 (eight) hours as needed. 90 tablet 5  . [DISCONTINUED] amoxicillin-clarithromycin-lansoprazole (PREVPAC) combo pack Take by mouth 2 (two) times daily. Follow package directions. 1 kit 0   No current facility-administered medications on file prior to  visit.     Past Surgical History  Procedure Laterality Date  . Tonsillectomy and adenoidectomy      one tonsil  . Pericardectomy  07/15/2010    Dr. [name]  . Pleurx catheter placement  07/15/2010    Dr. [name]  . Shoulder surgery      right and left shoulders  . Knee surgery      right   Past Medical History  Diagnosis Date  . Obesity   . Metabolic syndrome   . Hypertension   . Depression   . GERD (gastroesophageal reflux disease)   . Chronic back pain   . History of pancreatitis     a. admx 04-2009.Marland KitchenMarland Kitchen? 2-2 triglycerides  . Hypertriglyceridemia     a. followed by [facility]  . Acute pericarditis     admx 03-26-10 thru 03-29-10; a. echo 03-26-10: EF 55-60%; mild LVH; trivial MR; RVF ok; mild -mod circumferential Eff w/o tamponade  . Constrictive pericarditis   . OSA (obstructive sleep apnea)   . Gout     pt denies  . Family history of malignant neoplasm of gastrointestinal tract   . Allergic rhinitis, cause unspecified 09/14/2012  . DDD (degenerative disc disease), lumbar 09/14/2012  . Left sided sciatica 09/14/2012  . Erectile dysfunction 09/14/2012  . Hyperlipidemia 07/11/2013    Review of Systems  Constitutional: Negative for fever and chills.  Respiratory: Negative for chest tightness and shortness of breath.   Genitourinary:       Positive for nocturia. Positive for erectile dysfunction.   Musculoskeletal: Positive for back pain.  Skin:       Positive for skin tags.   Neurological: Negative for weakness and numbness.      Objective:    BP 138/80 mmHg  Pulse 67  Temp(Src) 97.8 F (36.6 C) (Oral)  Ht _0  (1.803 m)  Wt 258 lb 8 oz (117.255 kg)  BMI 36.07 kg/m2  SpO2 98% Nursing note and vital signs reviewed.  Physical Exam  Constitutional: He is oriented to person, place, and time. He appears well-developed and well-nourished. No distress.  Cardiovascular: Normal rate, regular rhythm, normal heart sounds and intact distal pulses.     Pulmonary/Chest: Effort normal and breath sounds normal.  Musculoskeletal:  Low back - no obvious deformity, discoloration, or edema noted. Range of motion is severely limited secondary to pain and discomfort. Straight leg raise is positive. Distal pulses, sensation, and reflexes are intact and appropriate.  Neurological: He is alert and oriented to person, place, and time.  Skin: Skin is warm and dry.     Psychiatric: He has a normal mood and affect. His behavior is normal. Judgment and thought content normal.       Assessment & Plan:   Problem List Items Addressed This Visit      Musculoskeletal and Integument   DDD (degenerative disc disease), lumbar - Primary    Left-sided lower back pain with left leg radiculopathy consistent with possible disc pathology and/or degenerative disc disease. Continue current dosage of gabapentin with referral to pain management pending starting  tomorrow. Home exercise therapy with exercises provided. Follow-up with primary care as needed.      Relevant Medications   oxyCODONE-acetaminophen (PERCOCET) 10-325 MG tablet   methocarbamol (ROBAXIN) 750 MG tablet   Accessory skin tags    Symptoms and exam consistent with accessory skin tags with no evidence of infection or irritation. Schedule separate office appointment for removal per patient request.        Genitourinary   Erectile dysfunction    Continues to experience associated symptom of erectile dysfunction and not currently maintained on medication. Discussed possibilities of sildenafil and Cialis. Patient will like to hold off on medication at this time. Information regarding discounted sildenafil provided through [employer].        Other   Nocturia    Nocturia of possible relation to type 2 diabetes or benign prostate hypertrophy. Obtain A1c to check current diabetes status. Obtain BPH check prostate. Follow-up pending lab results.      Relevant Orders   Hemoglobin A1c (Completed)    PSA (Completed)

## 2015-02-17 NOTE — Assessment & Plan Note (Signed)
Symptoms and exam consistent with accessory skin tags with no evidence of infection or irritation. Schedule separate office appointment for removal per patient request.

## 2015-02-18 DIAGNOSIS — M5416 Radiculopathy, lumbar region: Secondary | ICD-10-CM | POA: Diagnosis not present

## 2015-02-18 DIAGNOSIS — M5126 Other intervertebral disc displacement, lumbar region: Secondary | ICD-10-CM | POA: Diagnosis not present

## 2015-02-18 DIAGNOSIS — M25561 Pain in right knee: Secondary | ICD-10-CM | POA: Diagnosis not present

## 2015-02-18 NOTE — Telephone Encounter (Signed)
Pt aware of results 

## 2015-02-23 ENCOUNTER — Telehealth: Payer: Self-pay | Admitting: *Deleted

## 2015-02-23 MED ORDER — METFORMIN HCL 500 MG PO TABS: 500.0000 mg | ORAL_TABLET | Freq: Two times a day (BID) | ORAL | Status: DC

## 2015-02-23 NOTE — Telephone Encounter (Signed)
Left msg on triage stating nurse stated Greg sent rx in for him to start taking for diabetes. Per pharmacy they never received. Perr chart no prescription was sent did see where Marya Amsler stated will send Metformin. What dosage do you want pt to take w/instructions...Harold [name]

## 2015-02-23 NOTE — Telephone Encounter (Signed)
Medication sent to pharmacy  

## 2015-02-24 ENCOUNTER — Ambulatory Visit (INDEPENDENT_AMBULATORY_CARE_PROVIDER_SITE_OTHER): Payer: Commercial Managed Care - HMO | Admitting: Family

## 2015-02-24 ENCOUNTER — Encounter: Payer: Self-pay | Admitting: Family

## 2015-02-24 VITALS — BP 120/70 | HR 65 | Temp 98.3°F | Resp 18 | Ht 71.0 in | Wt 265.0 lb

## 2015-02-24 DIAGNOSIS — Q828 Other specified congenital malformations of skin: Secondary | ICD-10-CM

## 2015-02-24 DIAGNOSIS — R351 Nocturia: Secondary | ICD-10-CM | POA: Diagnosis not present

## 2015-02-24 DIAGNOSIS — B079 Viral wart, unspecified: Secondary | ICD-10-CM | POA: Diagnosis not present

## 2015-02-24 DIAGNOSIS — B078 Other viral warts: Secondary | ICD-10-CM

## 2015-02-24 MED ORDER — TADALAFIL 5 MG PO TABS: 5.0000 mg | ORAL_TABLET | Freq: Every day | ORAL | Status: DC

## 2015-02-24 NOTE — Progress Notes (Signed)
Pre visit review using our clinic review tool, if applicable. No additional management support is needed unless otherwise documented below in the visit note. 

## 2015-02-24 NOTE — Assessment & Plan Note (Signed)
  Procedure Note :    Procedure : Cryosurgery   Indication:  Verruca Vulgaris  Risks including unsuccessful procedure , bleeding, infection, bruising, scar, a need for a repeat  procedure and others were explained to the patient in detail as well as the benefits. Informed consent was obtained verbally.   Left hand were prepped and cleansed. Each site was treated with a Histofreezer applicator that was prepared according to the manufacturers instructions. Cold was applied for 40 seconds  Band-Aid was applied. Tolerated well with no complications.  Cryosurgery care instructions were provided with follow up for signs of infection.

## 2015-02-24 NOTE — Patient Instructions (Signed)
Thank you for choosing [facility].  Summary/Instructions:  Cryosurgery for Skin Conditions, Care After These instructions give you information on caring for yourself after your procedure. Your doctor may also give you more specific instructions. Call your doctor if you have any problems or questions after your procedure.  HOME CARE  Keep your treated skin clean, dry, and covered with a bandage until it heals.  Clean the skin as usual with soap and water.  You may take showers. If your bandage gets wet, change it right away.  Do not pick at your blister or try to break it open.  Do not put medicine, cream, or lotion on your skin except as told by your doctor. GET HELP IF:  Your treated skin gets more painful, puffy (swollen), or red.  You notice more blood or fluid coming from your treated skin.  Your blister is large and painful.   This information is not intended to replace advice given to you by your health care provider. Make sure you discuss any questions you have with your health care provider.   Document Released: 06/06/2011 Document Revised: 11/14/2012 Document Reviewed: 10/12/2012 Elsevier Interactive Patient Education [employer].

## 2015-02-24 NOTE — Progress Notes (Signed)
Subjective:    Patient ID: Harold [name], male    DOB: 1966-02-09, 49 y.o.   MRN: [id number]  Chief Complaint  Patient presents with  . Skin tag    would like skin tags removed    HPI:  [name] is a 49 y.o. male who  has a past medical history of Obesity; Metabolic syndrome; Hypertension; Depression; GERD (gastroesophageal reflux disease); Chronic back pain; History of pancreatitis; Hypertriglyceridemia; Acute pericarditis; Constrictive pericarditis; OSA (obstructive sleep apnea); Gout; Family history of malignant neoplasm of gastrointestinal tract; Allergic rhinitis, cause unspecified (09/14/2012); DDD (degenerative disc disease), lumbar (09/14/2012); Left sided sciatica (09/14/2012); Erectile dysfunction (09/14/2012); and Hyperlipidemia (07/11/2013). and presents today for an office visit.  1.) Patient presents today for an office visit to remove skin tags located in his axilla and also a wart located on his left hand.   2.) Nocturia - Reports symptoms of nocturia continue and were improved with Cialis when he took it. PSA was 2.0. Would like to start medication if possible.   Lab Results  Component Value Date   PSA 2.00 02/17/2015   PSA 1.33 11/19/2013   PSA 1.25 09/14/2012    Allergies  Allergen Reactions  . Diphenhydramine Itching, Palpitations and Other (See Comments)    "jittery"  . Other Itching and Swelling    Pecans: itching and swelling of the tongue   . Septra [Sulfamethoxazole-Trimethoprim] Itching  . Colchicine Diarrhea  . Lipitor [Atorvastatin] Other (See Comments)    Muscle cramps     Current Outpatient Prescriptions on File Prior to Visit  Medication Sig Dispense Refill  . amLODipine (NORVASC) 10 MG tablet TAKE ONE TABLET BY MOUTH DAILY 90 tablet 2  . aspirin 81 MG tablet     . atorvastatin (LIPITOR) 20 MG tablet Take 1 tablet (20 mg total) by mouth daily. 90 tablet 3  . citalopram (CELEXA) 40 MG tablet Take 1 tablet (40 mg total) by mouth daily. 90  tablet 3  . clonazePAM (KLONOPIN) 0.5 MG tablet TAKE 1 TABLET BY MOUTH TWICE DAILY AS NEEDED FOR ANXIETY 60 tablet 1  . fenofibrate (TRICOR) 145 MG tablet TAKE ONE TABLET BY MOUTH DAILY 90 tablet 2  . furosemide (LASIX) 20 MG tablet Take 1 tablet (20 mg total) by mouth daily. 90 tablet 3  . gabapentin (NEURONTIN) 100 MG capsule Take 1 capsule (100 mg total) by mouth daily. 90 capsule 3  . lisinopril (PRINIVIL,ZESTRIL) 20 MG tablet Take 1 tablet (20 mg total) by mouth 2 (two) times daily. 180 tablet 3  . metFORMIN (GLUCOPHAGE) 500 MG tablet Take 1 tablet (500 mg total) by mouth 2 (two) times daily with a meal. 60 tablet 0  . methocarbamol (ROBAXIN) 750 MG tablet Take 1 tablet (750 mg total) by mouth 3 (three) times daily as needed for muscle spasms. 90 tablet 0  . oxyCODONE-acetaminophen (PERCOCET) 10-325 MG tablet TK 1 T PO Q 6 H PRN P  0  . potassium chloride (K-DUR) 10 MEQ tablet TAKE ONE TABLET BY MOUTH DAILY 90 tablet 2  . promethazine-codeine (PHENERGAN WITH CODEINE) 6.25-10 MG/5ML syrup Take 5 mLs by mouth every 6 (six) hours as needed for cough. 118 mL 0  . rosuvastatin (CRESTOR) 20 MG tablet Take 1 tablet (20 mg total) by mouth daily. 90 tablet 3  . traMADol (ULTRAM) 50 MG tablet Take 1 tablet (50 mg total) by mouth every 8 (eight) hours as needed. 90 tablet 5  . VOLTAREN 1 % GEL Apply 2  g topically 4 (four) times daily. 100 g 5  . [DISCONTINUED] amoxicillin-clarithromycin-lansoprazole (PREVPAC) combo pack Take by mouth 2 (two) times daily. Follow package directions. 1 kit 0   No current facility-administered medications on file prior to visit.    Review of Systems  Constitutional: Negative for fever and chills.  Genitourinary: Negative for dysuria, urgency, hematuria, discharge and testicular pain.       Positive for nocturia  Skin:       Positive for skin tags and a wart      Objective:    BP 120/70 mmHg  Pulse 65  Temp(Src) 98.3 F (36.8 C) (Oral)  Resp 18  Ht _0   (1.803 m)  Wt 265 lb (120.203 kg)  BMI 36.98 kg/m2  SpO2 96% Nursing note and vital signs reviewed.  Physical Exam  Constitutional: He is oriented to person, place, and time. He appears well-developed and well-nourished. No distress.  Cardiovascular: Normal rate, regular rhythm, normal heart sounds and intact distal pulses.   Pulmonary/Chest: Effort normal and breath sounds normal.  Neurological: He is alert and oriented to person, place, and time.  Skin: Skin is warm and dry.  Multiple skin tags noted in the axilla and a wart noted on the left hand between the second and third MCP joint.   Psychiatric: He has a normal mood and affect. His behavior is normal. Judgment and thought content normal.       Assessment & Plan:    Problem List Items Addressed This Visit      Musculoskeletal and Integument   Accessory skin tags - Primary     Procedure Note :    Procedure : Cryosurgery   Indication:  Skin tag and verruca vulgaris removal  Risks including unsuccessful procedure , bleeding, infection, bruising, scar, a need for a repeat  procedure and others were explained to the patient in detail as well as the benefits. Informed consent was obtained verbally.   Skin tags were prepped and cleansed. Each site was treated with a Histofreezer applicator that was prepared according to the manufacturers instructions. Cold was applied for 40 seconds at each site.  Band-Aid was applied. Tolerated well with no complications.  Cryosurgery care instructions were provided with follow up for signs of infection.       Verruca vulgaris     Procedure Note :    Procedure : Cryosurgery   Indication:  Verruca Vulgaris  Risks including unsuccessful procedure , bleeding, infection, bruising, scar, a need for a repeat  procedure and others were explained to the patient in detail as well as the benefits. Informed consent was obtained verbally.   Left hand were prepped and cleansed. Each site was  treated with a Histofreezer applicator that was prepared according to the manufacturers instructions. Cold was applied for 40 seconds  Band-Aid was applied. Tolerated well with no complications.  Cryosurgery care instructions were provided with follow up for signs of infection.         Other   Nocturia    Most likely related to slightly enlarged prostate, although cannot rule out diabetes fully. Start Cilalis for prostate enlargement. Follow up if symptoms do not improve with medication.       Relevant Medications   tadalafil (CIALIS) 5 MG tablet

## 2015-02-24 NOTE — Assessment & Plan Note (Signed)
  Procedure Note :    Procedure : Cryosurgery   Indication:  Skin tag and verruca vulgaris removal  Risks including unsuccessful procedure , bleeding, infection, bruising, scar, a need for a repeat  procedure and others were explained to the patient in detail as well as the benefits. Informed consent was obtained verbally.   Skin tags were prepped and cleansed. Each site was treated with a Histofreezer applicator that was prepared according to the manufacturers instructions. Cold was applied for 40 seconds at each site.  Band-Aid was applied. Tolerated well with no complications.  Cryosurgery care instructions were provided with follow up for signs of infection.

## 2015-02-24 NOTE — Assessment & Plan Note (Signed)
Most likely related to slightly enlarged prostate, although cannot rule out diabetes fully. Start Cilalis for prostate enlargement. Follow up if symptoms do not improve with medication.

## 2015-02-24 NOTE — Telephone Encounter (Signed)
Notified pt Greg sent rx to [facility]...Harold [name]

## 2015-02-26 ENCOUNTER — Ambulatory Visit
Admission: RE | Admit: 2015-02-26 | Discharge: 2015-02-26 | Disposition: A | Discharge status: Home / Self Care | Payer: Commercial Managed Care - HMO | Source: Ambulatory Visit | Attending: Internal Medicine | Admitting: Internal Medicine

## 2015-02-26 DIAGNOSIS — M5416 Radiculopathy, lumbar region: Secondary | ICD-10-CM | POA: Diagnosis not present

## 2015-02-26 MED ORDER — IOHEXOL 180 MG/ML  SOLN
1.0000 mL | Freq: Once | INTRAMUSCULAR | Status: AC | PRN
  Administered 2015-02-26: 1 mL via EPIDURAL

## 2015-02-26 MED ORDER — METHYLPREDNISOLONE ACETATE 40 MG/ML INJ SUSP (RADIOLOG
120.0000 mg | Freq: Once | INTRAMUSCULAR | Status: AC
  Administered 2015-02-26: 120 mg via EPIDURAL

## 2015-02-26 NOTE — Discharge Instructions (Signed)

## 2015-02-27 ENCOUNTER — Telehealth: Payer: Self-pay | Admitting: *Deleted

## 2015-02-27 NOTE — Telephone Encounter (Signed)
I have not seen the PA for this.

## 2015-02-27 NOTE — Telephone Encounter (Signed)
Pt states he is needing a PA on his cialis. Insurance stated they fax Marya Amsler a form to be completed 3 days ago. He is wanting to know has Marya Amsler seen PA...Johny Chess

## 2015-03-02 NOTE — Telephone Encounter (Signed)
Completed PA on cover my meds. Response was Adony Burningham (Key: [id number])  The request has received a Pending outcome.  Please note any additional information provided by [facility] at the bottom of your screen.  You will receive a final determination electronically in CoverMyMeds and via email and fax within 24 to 72 hours.  Notified pt with PA status....Harold [name]

## 2015-03-04 ENCOUNTER — Ambulatory Visit (INDEPENDENT_AMBULATORY_CARE_PROVIDER_SITE_OTHER): Payer: Commercial Managed Care - HMO | Admitting: Family

## 2015-03-04 ENCOUNTER — Encounter: Payer: Self-pay | Admitting: Family

## 2015-03-04 VITALS — BP 138/80 | HR 68 | Temp 97.6°F | Resp 18 | Ht 71.0 in | Wt 261.0 lb

## 2015-03-04 DIAGNOSIS — Z23 Encounter for immunization: Secondary | ICD-10-CM

## 2015-03-04 DIAGNOSIS — E119 Type 2 diabetes mellitus without complications: Secondary | ICD-10-CM | POA: Insufficient documentation

## 2015-03-04 DIAGNOSIS — J019 Acute sinusitis, unspecified: Secondary | ICD-10-CM | POA: Diagnosis not present

## 2015-03-04 HISTORY — DX: Type 2 diabetes mellitus without complications: E11.9

## 2015-03-04 MED ORDER — BENZONATATE 100 MG PO CAPS
100.0000 mg | ORAL_CAPSULE | Freq: Two times a day (BID) | ORAL | Status: DC | PRN
Start: 2015-03-04 — End: 2015-04-07

## 2015-03-04 MED ORDER — DOXYCYCLINE HYCLATE 100 MG PO TABS: 100.0000 mg | ORAL_TABLET | Freq: Two times a day (BID) | ORAL | Status: DC

## 2015-03-04 MED ORDER — PROMETHAZINE-CODEINE 6.25-10 MG/5ML PO SYRP: 5.0000 mL | ORAL_SOLUTION | Freq: Four times a day (QID) | ORAL | Status: DC | PRN

## 2015-03-04 NOTE — Assessment & Plan Note (Signed)
Continues to experience sinusitis. Start doxycycline. Refill Tessalon and promethazine-codeine. Continue over-the-counter medications as needed for symptom relief and supportive care. Follow-up if symptoms worsen or do not improve.

## 2015-03-04 NOTE — Progress Notes (Signed)
Subjective:    Patient ID: Harold [name], male    DOB: Jul 16, 1965, 49 y.o.   MRN: [id number]  Chief Complaint  Patient presents with  . Diabetes    A1c check, thinks he has a sinus infection    HPI:  Harold [name] is a 49 y.o. male who  has a past medical history of Obesity; Metabolic syndrome; Hypertension; Depression; GERD (gastroesophageal reflux disease); Chronic back pain; History of pancreatitis; Hypertriglyceridemia; Acute pericarditis; Constrictive pericarditis; OSA (obstructive sleep apnea); Gout; Family history of malignant neoplasm of gastrointestinal tract; Allergic rhinitis, cause unspecified (09/14/2012); DDD (degenerative disc disease), lumbar (09/14/2012); Left sided sciatica (09/14/2012); Erectile dysfunction (09/14/2012); and Hyperlipidemia (07/11/2013). and presents today for a follow up office visit.   1.) Diabetes - Recently diagnosed with Type 2 diabetes and A1c of 7.5. Started on metformin about 1 week ago. Takes the medication as prescribed and reports initial GI distress and has improved since then.  Maintained on lisinopril and rosuvastatin for CAD risk reduction. Due for a pneumovax and foot exam. Recently seen for eye exam about 2 weeks ago.     Lab Results  Component Value Date   HGBA1C 7.5* 02/17/2015    2.) Sinus problem - Associated symptom of congestion, coughing sinus pressure, headaches and throat soreness which has been going on since the previous sinus infection. Denies fevers. Recently completed a course of Augmentin.   Allergies  Allergen Reactions  . Diphenhydramine Itching, Palpitations and Other (See Comments)    "jittery"  . Other Itching and Swelling    Pecans: itching and swelling of the tongue   . Septra [Sulfamethoxazole-Trimethoprim] Itching  . Colchicine Diarrhea  . Lipitor [Atorvastatin] Other (See Comments)    Muscle cramps     Current Outpatient Prescriptions on File Prior to Visit  Medication Sig Dispense Refill  .  amLODipine (NORVASC) 10 MG tablet TAKE ONE TABLET BY MOUTH DAILY 90 tablet 2  . aspirin 81 MG tablet     . citalopram (CELEXA) 40 MG tablet Take 1 tablet (40 mg total) by mouth daily. 90 tablet 3  . clonazePAM (KLONOPIN) 0.5 MG tablet TAKE 1 TABLET BY MOUTH TWICE DAILY AS NEEDED FOR ANXIETY 60 tablet 1  . fenofibrate (TRICOR) 145 MG tablet TAKE ONE TABLET BY MOUTH DAILY 90 tablet 2  . furosemide (LASIX) 20 MG tablet Take 1 tablet (20 mg total) by mouth daily. 90 tablet 3  . gabapentin (NEURONTIN) 100 MG capsule Take 1 capsule (100 mg total) by mouth daily. 90 capsule 3  . lisinopril (PRINIVIL,ZESTRIL) 20 MG tablet Take 1 tablet (20 mg total) by mouth 2 (two) times daily. 180 tablet 3  . metFORMIN (GLUCOPHAGE) 500 MG tablet Take 1 tablet (500 mg total) by mouth 2 (two) times daily with a meal. 60 tablet 0  . methocarbamol (ROBAXIN) 750 MG tablet Take 1 tablet (750 mg total) by mouth 3 (three) times daily as needed for muscle spasms. 90 tablet 0  . oxyCODONE-acetaminophen (PERCOCET) 10-325 MG tablet TK 1 T PO Q 6 H PRN P  0  . potassium chloride (K-DUR) 10 MEQ tablet TAKE ONE TABLET BY MOUTH DAILY 90 tablet 2  . rosuvastatin (CRESTOR) 20 MG tablet Take 1 tablet (20 mg total) by mouth daily. 90 tablet 3  . tadalafil (CIALIS) 5 MG tablet Take 1 tablet (5 mg total) by mouth daily. 30 tablet 0  . traMADol (ULTRAM) 50 MG tablet Take 1 tablet (50 mg total) by mouth every 8 (eight)  hours as needed. 90 tablet 5  . VOLTAREN 1 % GEL Apply 2 g topically 4 (four) times daily. 100 g 5  . [DISCONTINUED] amoxicillin-clarithromycin-lansoprazole (PREVPAC) combo pack Take by mouth 2 (two) times daily. Follow package directions. 1 kit 0   No current facility-administered medications on file prior to visit.     Past Surgical History  Procedure Laterality Date  . Tonsillectomy and adenoidectomy      one tonsil  . Pericardectomy  07/15/2010    Dr. [name]  . Pleurx catheter placement  07/15/2010    Dr. [name]    . Shoulder surgery      right and left shoulders  . Knee surgery      right    Review of Systems  Constitutional: Negative for fever.  HENT: Positive for congestion, sinus pressure and sore throat.   Respiratory: Positive for cough. Negative for chest tightness and stridor.   Neurological: Positive for headaches.      Objective:    BP 138/80 mmHg  Pulse 68  Temp(Src) 97.6 F (36.4 C) (Oral)  Resp 18  Ht '5\' 11"'  (1.803 m)  Wt 261 lb (118.389 kg)  BMI 36.42 kg/m2  SpO2 97% Nursing note and vital signs reviewed.  Physical Exam  Constitutional: He is oriented to person, place, and time. He appears well-developed and well-nourished. No distress.  HENT:  Right Ear: Hearing, tympanic membrane, external ear and ear canal normal.  Left Ear: Hearing, tympanic membrane, external ear and ear canal normal.  Nose: Right sinus exhibits maxillary sinus tenderness and frontal sinus tenderness. Left sinus exhibits maxillary sinus tenderness and frontal sinus tenderness.  Mouth/Throat: Uvula is midline, oropharynx is clear and moist and mucous membranes are normal.  Neck: Neck supple.  Cardiovascular: Normal rate, regular rhythm, normal heart sounds and intact distal pulses.   Pulmonary/Chest: Effort normal and breath sounds normal.  Neurological: He is alert and oriented to person, place, and time.  Diabetic foot exam - bilateral feet are free from skin breakdown, cuts, and abrasions. Toenails are neatly trimmed. Pulses are intact and appropriate. Sensation is intact to monofilament bilaterally.  Skin: Skin is warm and dry.  Psychiatric: He has a normal mood and affect. His behavior is normal. Judgment and thought content normal.       Assessment & Plan:   Problem List Items Addressed This Visit      Respiratory   Acute sinus infection - Primary    Continues to experience sinusitis. Start doxycycline. Refill Tessalon and promethazine-codeine. Continue over-the-counter medications as  needed for symptom relief and supportive care. Follow-up if symptoms worsen or do not improve.      Relevant Medications   promethazine-codeine (PHENERGAN WITH CODEINE) 6.25-10 MG/5ML syrup   benzonatate (TESSALON) 100 MG capsule   doxycycline (VIBRA-TABS) 100 MG tablet     Endocrine   Type 2 diabetes mellitus ([place])    Newly diagnosed type 2 diabetes maintained on metformin. Diabetic foot exam completed. Diabetic eye exam is up-to-date. Maintained on Crestor lisinopril for CAD risk reduction. Pneumovax updated today. Continue current dosage of metformin. Follow-up in 3 months for A1c check.

## 2015-03-04 NOTE — Assessment & Plan Note (Signed)
Newly diagnosed type 2 diabetes maintained on metformin. Diabetic foot exam completed. Diabetic eye exam is up-to-date. Maintained on Crestor lisinopril for CAD risk reduction. Pneumovax updated today. Continue current dosage of metformin. Follow-up in 3 months for A1c check.

## 2015-03-04 NOTE — Patient Instructions (Signed)
Thank you for choosing [facility].  Summary/Instructions:  Please follow up with a meter for diabetes that is covered by your insurance.  Morning blood sugar readings should be between 80-130 on average.  Your prescription(s) have been submitted to your pharmacy or been printed and provided for you. Please take as directed and contact our office if you believe you are having problem(s) with the medication(s) or have any questions.  If your symptoms worsen or fail to improve, please contact our office for further instruction, or in case of emergency go directly to the emergency room at the closest medical facility.   Sinusitis, Adult Sinusitis is redness, soreness, and inflammation of the paranasal sinuses. Paranasal sinuses are air pockets within the bones of your face. They are located beneath your eyes, in the middle of your forehead, and above your eyes. In healthy paranasal sinuses, mucus is able to drain out, and air is able to circulate through them by way of your nose. However, when your paranasal sinuses are inflamed, mucus and air can become trapped. This can allow bacteria and other germs to grow and cause infection. Sinusitis can develop quickly and last only a short time (acute) or continue over a long period (chronic). Sinusitis that lasts for more than 12 weeks is considered chronic. CAUSES Causes of sinusitis include:  Allergies.  Structural abnormalities, such as displacement of the cartilage that separates your nostrils (deviated septum), which can decrease the air flow through your nose and sinuses and affect sinus drainage.  Functional abnormalities, such as when the small hairs (cilia) that line your sinuses and help remove mucus do not work properly or are not present. SIGNS AND SYMPTOMS Symptoms of acute and chronic sinusitis are the same. The primary symptoms are pain and pressure around the affected sinuses. Other symptoms include:  Upper  toothache.  Earache.  Headache.  Bad breath.  Decreased sense of smell and taste.  A cough, which worsens when you are lying flat.  Fatigue.  Fever.  Thick drainage from your nose, which often is green and may contain pus (purulent).  Swelling and warmth over the affected sinuses. DIAGNOSIS Your health care provider will perform a physical exam. During your exam, your health care provider may perform any of the following to help determine if you have acute sinusitis or chronic sinusitis:  Look in your nose for signs of abnormal growths in your nostrils (nasal polyps).  Tap over the affected sinus to check for signs of infection.  View the inside of your sinuses using an imaging device that has a light attached (endoscope). If your health care provider suspects that you have chronic sinusitis, one or more of the following tests may be recommended:  Allergy tests.  Nasal culture. A sample of mucus is taken from your nose, sent to a lab, and screened for bacteria.  Nasal cytology. A sample of mucus is taken from your nose and examined by your health care provider to determine if your sinusitis is related to an allergy. TREATMENT Most cases of acute sinusitis are related to a viral infection and will resolve on their own within 10 days. Sometimes, medicines are prescribed to help relieve symptoms of both acute and chronic sinusitis. These may include pain medicines, decongestants, nasal steroid sprays, or saline sprays. However, for sinusitis related to a bacterial infection, your health care provider will prescribe antibiotic medicines. These are medicines that will help kill the bacteria causing the infection. Rarely, sinusitis is caused by a fungal infection.  In these cases, your health care provider will prescribe antifungal medicine. For some cases of chronic sinusitis, surgery is needed. Generally, these are cases in which sinusitis recurs more than 3 times per year, despite  other treatments. HOME CARE INSTRUCTIONS  Drink plenty of water. Water helps thin the mucus so your sinuses can drain more easily.  Use a humidifier.  Inhale steam 3-4 times a day (for example, sit in the bathroom with the shower running).  Apply a warm, moist washcloth to your face 3-4 times a day, or as directed by your health care provider.  Use saline nasal sprays to help moisten and clean your sinuses.  Take medicines only as directed by your health care provider.  If you were prescribed either an antibiotic or antifungal medicine, finish it all even if you start to feel better. SEEK IMMEDIATE MEDICAL CARE IF:  You have increasing pain or severe headaches.  You have nausea, vomiting, or drowsiness.  You have swelling around your face.  You have vision problems.  You have a stiff neck.  You have difficulty breathing.   This information is not intended to replace advice given to you by your health care provider. Make sure you discuss any questions you have with your health care provider.   Document Released: 03/14/2005 Document Revised: 04/04/2014 Document Reviewed: 03/29/2011 Elsevier Interactive Patient Education [employer].

## 2015-03-04 NOTE — Progress Notes (Signed)
Pre visit review using our clinic review tool, if applicable. No additional management support is needed unless otherwise documented below in the visit note. 

## 2015-03-05 NOTE — Telephone Encounter (Signed)
Check cover-my-meds for PA status. Med has been denied it states does not meet medicare guidelines. Letter has been sent to MD & pt...Harold [name]

## 2015-03-06 ENCOUNTER — Telehealth: Payer: Self-pay | Admitting: Family

## 2015-03-06 NOTE — Telephone Encounter (Signed)
Please call Harold [name] at [phone]. She states the insurance will cover the trividia & roche test strips She also wants to talk with you regarding another appeal

## 2015-03-09 ENCOUNTER — Other Ambulatory Visit: Payer: Self-pay

## 2015-03-09 NOTE — Telephone Encounter (Signed)
Also LVM letting Lexine Baton know that the PA for cialis will not be approved and either need to pay out of pocket or pick another alternative.

## 2015-03-09 NOTE — Telephone Encounter (Signed)
LVM for pt to call back.

## 2015-03-09 NOTE — Telephone Encounter (Signed)
Pt called back and they are wanting to know about the monitor and the strips. She states Harold [name] said he had monitors here and when will the strips be called in. Please call her back.

## 2015-03-09 NOTE — Telephone Encounter (Signed)
Written prescription to be faxed to pharmacy.

## 2015-03-09 NOTE — Telephone Encounter (Signed)
Strips and meter have been sent. Pt is aware.

## 2015-03-09 NOTE — Telephone Encounter (Signed)
The test strips that are covered are the trividia and roche test strips

## 2015-03-10 ENCOUNTER — Telehealth: Payer: Self-pay

## 2015-03-10 DIAGNOSIS — E119 Type 2 diabetes mellitus without complications: Secondary | ICD-10-CM

## 2015-03-10 MED ORDER — GLUCOSE BLOOD VI STRP
ORAL_STRIP | Status: DC
Start: 2015-03-10 — End: 2018-08-22

## 2015-03-10 MED ORDER — ACCU-CHEK NANO SMARTVIEW W/DEVICE KIT: PACK | Status: DC

## 2015-03-10 NOTE — Telephone Encounter (Signed)
Sent to pharmacy 

## 2015-03-10 NOTE — Telephone Encounter (Deleted)
accu te

## 2015-03-10 NOTE — Telephone Encounter (Signed)
Pt states insurance will cover accu-check nano. Can you please send that in with test strips.

## 2015-03-12 DIAGNOSIS — M25561 Pain in right knee: Secondary | ICD-10-CM | POA: Diagnosis not present

## 2015-03-26 DIAGNOSIS — M5126 Other intervertebral disc displacement, lumbar region: Secondary | ICD-10-CM | POA: Diagnosis not present

## 2015-03-26 DIAGNOSIS — M25561 Pain in right knee: Secondary | ICD-10-CM | POA: Diagnosis not present

## 2015-03-26 DIAGNOSIS — Z6837 Body mass index (BMI) 37.0-37.9, adult: Secondary | ICD-10-CM | POA: Diagnosis not present

## 2015-03-26 DIAGNOSIS — M5416 Radiculopathy, lumbar region: Secondary | ICD-10-CM | POA: Diagnosis not present

## 2015-04-05 ENCOUNTER — Other Ambulatory Visit: Payer: Self-pay | Admitting: Family

## 2015-04-07 ENCOUNTER — Other Ambulatory Visit: Payer: Self-pay | Admitting: Family

## 2015-04-14 ENCOUNTER — Telehealth: Payer: Self-pay | Admitting: Family

## 2015-04-14 NOTE — Telephone Encounter (Signed)
Is requesting call back for confirmation of receiving form for medication.

## 2015-04-16 DIAGNOSIS — M5416 Radiculopathy, lumbar region: Secondary | ICD-10-CM | POA: Diagnosis not present

## 2015-04-16 DIAGNOSIS — M25561 Pain in right knee: Secondary | ICD-10-CM | POA: Diagnosis not present

## 2015-04-16 DIAGNOSIS — M5126 Other intervertebral disc displacement, lumbar region: Secondary | ICD-10-CM | POA: Diagnosis not present

## 2015-04-21 DIAGNOSIS — Z6836 Body mass index (BMI) 36.0-36.9, adult: Secondary | ICD-10-CM | POA: Diagnosis not present

## 2015-04-21 DIAGNOSIS — M5127 Other intervertebral disc displacement, lumbosacral region: Secondary | ICD-10-CM | POA: Diagnosis not present

## 2015-04-21 DIAGNOSIS — M5416 Radiculopathy, lumbar region: Secondary | ICD-10-CM | POA: Diagnosis not present

## 2015-04-22 NOTE — Telephone Encounter (Signed)
Received fax from insurance requesting diabetic supplies. Form filled and faxed back.

## 2015-04-30 ENCOUNTER — Telehealth: Payer: Self-pay | Admitting: Family

## 2015-04-30 NOTE — Telephone Encounter (Signed)
States [facility] will be calling for patient to get script for meter and strips.

## 2015-05-01 NOTE — Telephone Encounter (Signed)
Spoke with them today and give verbal ok for diabetic supplies

## 2015-05-06 ENCOUNTER — Other Ambulatory Visit: Payer: Self-pay | Admitting: Neurological Surgery

## 2015-05-06 DIAGNOSIS — M5416 Radiculopathy, lumbar region: Secondary | ICD-10-CM

## 2015-05-07 ENCOUNTER — Other Ambulatory Visit: Payer: Self-pay | Admitting: Family

## 2015-05-07 NOTE — Telephone Encounter (Signed)
Last refill was 121/7/16 please advise on refill

## 2015-05-13 DIAGNOSIS — M25561 Pain in right knee: Secondary | ICD-10-CM | POA: Diagnosis not present

## 2015-05-20 ENCOUNTER — Other Ambulatory Visit: Payer: Self-pay | Admitting: Family

## 2015-05-20 ENCOUNTER — Ambulatory Visit
Admission: RE | Admit: 2015-05-20 | Discharge: 2015-05-20 | Disposition: A | Discharge status: Home / Self Care | Payer: Commercial Managed Care - HMO | Source: Ambulatory Visit | Attending: Neurological Surgery | Admitting: Neurological Surgery

## 2015-05-20 DIAGNOSIS — M5126 Other intervertebral disc displacement, lumbar region: Secondary | ICD-10-CM | POA: Diagnosis not present

## 2015-05-20 DIAGNOSIS — M5416 Radiculopathy, lumbar region: Secondary | ICD-10-CM

## 2015-05-20 NOTE — Telephone Encounter (Signed)
Please advise, thanks.

## 2015-05-22 ENCOUNTER — Ambulatory Visit: Payer: Commercial Managed Care - HMO | Admitting: Family

## 2015-06-01 DIAGNOSIS — M25561 Pain in right knee: Secondary | ICD-10-CM | POA: Diagnosis not present

## 2015-06-02 DIAGNOSIS — D1779 Benign lipomatous neoplasm of other sites: Secondary | ICD-10-CM | POA: Diagnosis not present

## 2015-06-02 DIAGNOSIS — Z6835 Body mass index (BMI) 35.0-35.9, adult: Secondary | ICD-10-CM | POA: Diagnosis not present

## 2015-06-02 DIAGNOSIS — I10 Essential (primary) hypertension: Secondary | ICD-10-CM | POA: Diagnosis not present

## 2015-06-02 DIAGNOSIS — M4806 Spinal stenosis, lumbar region: Secondary | ICD-10-CM | POA: Diagnosis not present

## 2015-06-04 ENCOUNTER — Other Ambulatory Visit: Payer: Self-pay | Admitting: Internal Medicine

## 2015-06-19 ENCOUNTER — Telehealth: Payer: Self-pay | Admitting: Family

## 2015-06-19 MED ORDER — AMLODIPINE BESYLATE 10 MG PO TABS: 10.0000 mg | ORAL_TABLET | Freq: Every day | ORAL | Status: DC

## 2015-06-19 NOTE — Telephone Encounter (Signed)
Rx sent. Pt aware.  

## 2015-06-19 NOTE — Telephone Encounter (Signed)
Pt called in and needs a refill on his amLODipine (NORVASC) 10 MG tablet [id number]  Today

## 2015-06-24 DIAGNOSIS — M4806 Spinal stenosis, lumbar region: Secondary | ICD-10-CM | POA: Diagnosis not present

## 2015-06-24 DIAGNOSIS — M25561 Pain in right knee: Secondary | ICD-10-CM | POA: Diagnosis not present

## 2015-06-24 DIAGNOSIS — D1779 Benign lipomatous neoplasm of other sites: Secondary | ICD-10-CM | POA: Diagnosis not present

## 2015-07-23 ENCOUNTER — Ambulatory Visit: Payer: Commercial Managed Care - HMO | Admitting: Family

## 2015-07-23 DIAGNOSIS — Z0289 Encounter for other administrative examinations: Secondary | ICD-10-CM

## 2015-07-24 ENCOUNTER — Ambulatory Visit (INDEPENDENT_AMBULATORY_CARE_PROVIDER_SITE_OTHER): Payer: Commercial Managed Care - HMO | Admitting: Internal Medicine

## 2015-07-24 ENCOUNTER — Encounter: Payer: Self-pay | Admitting: Internal Medicine

## 2015-07-24 ENCOUNTER — Other Ambulatory Visit (INDEPENDENT_AMBULATORY_CARE_PROVIDER_SITE_OTHER): Payer: Commercial Managed Care - HMO

## 2015-07-24 VITALS — BP 140/82 | HR 72 | Resp 20 | Wt 257.0 lb

## 2015-07-24 DIAGNOSIS — I10 Essential (primary) hypertension: Secondary | ICD-10-CM

## 2015-07-24 DIAGNOSIS — R300 Dysuria: Secondary | ICD-10-CM

## 2015-07-24 DIAGNOSIS — Z202 Contact with and (suspected) exposure to infections with a predominantly sexual mode of transmission: Secondary | ICD-10-CM | POA: Insufficient documentation

## 2015-07-24 DIAGNOSIS — E119 Type 2 diabetes mellitus without complications: Secondary | ICD-10-CM

## 2015-07-24 DIAGNOSIS — L293 Anogenital pruritus, unspecified: Secondary | ICD-10-CM | POA: Insufficient documentation

## 2015-07-24 DIAGNOSIS — R350 Frequency of micturition: Secondary | ICD-10-CM | POA: Insufficient documentation

## 2015-07-24 HISTORY — DX: Frequency of micturition: R35.0

## 2015-07-24 LAB — URINALYSIS, ROUTINE W REFLEX MICROSCOPIC
Bilirubin Urine: NEGATIVE
Hgb urine dipstick: NEGATIVE
Ketones, ur: NEGATIVE
Nitrite: NEGATIVE
RBC / HPF: NONE SEEN (ref 0–?)
Specific Gravity, Urine: 1.015 (ref 1.000–1.030)
Urine Glucose: NEGATIVE
Urobilinogen, UA: 0.2 (ref 0.0–1.0)
pH: 6.5 (ref 5.0–8.0)

## 2015-07-24 LAB — POCT URINALYSIS DIPSTICK
Bilirubin, UA: NEGATIVE
Blood, UA: NEGATIVE
Glucose, UA: NEGATIVE
Ketones, UA: NEGATIVE
Nitrite, UA: NEGATIVE
Spec Grav, UA: 1.025
Urobilinogen, UA: NEGATIVE
pH, UA: 6

## 2015-07-24 MED ORDER — CIPROFLOXACIN HCL 500 MG PO TABS: 500.0000 mg | ORAL_TABLET | Freq: Two times a day (BID) | ORAL | Status: DC

## 2015-07-24 MED ORDER — METRONIDAZOLE 250 MG PO TABS: 250.0000 mg | ORAL_TABLET | Freq: Three times a day (TID) | ORAL | Status: DC

## 2015-07-24 NOTE — Patient Instructions (Signed)
Please take all new medication as prescribed - the flagyl and cipro antibiotics  Please continue all other medications as before, and refills have been done if requested.  Please have the pharmacy call with any other refills you may need.  Please keep your appointments with your specialists as you may have planned  Your specimen will be sent for culture as well

## 2015-07-24 NOTE — Progress Notes (Signed)
Pre visit review using our clinic review tool, if applicable. No additional management support is needed unless otherwise documented below in the visit note. 

## 2015-07-24 NOTE — Progress Notes (Signed)
Subjective:    Patient ID: Harold [name], male    DOB: [birth year], 50 y.o.   MRN: [id number]  HPI  Here with c/o urinary freq and mild freq with feeling warm but no high fever or chills, Denies urinary symptoms such as urgency, flank pain, hematuria or n/v, fever, chills.  No prior hx of same.  Did have conversation with a sexual partner who informed him she was just tx for trichomonas, no other STD.  Pt without other contact since.  Pt denies chest pain, increased sob or doe, wheezing, orthopnea, PND, increased LE swelling, palpitations, dizziness or syncope.  Pt denies new neurological symptoms such as new headache, or facial or extremity weakness or numbness   Pt denies polydipsia, polyuria, Past Medical History  Diagnosis Date  . Obesity   . Metabolic syndrome   . Hypertension   . Depression   . GERD (gastroesophageal reflux disease)   . Chronic back pain   . History of pancreatitis     a. admx 04-2009.Marland KitchenMarland Kitchen? 2-2 triglycerides  . Hypertriglyceridemia     a. followed by [facility]  . Acute pericarditis     admx 03-26-10 thru 03-29-10; a. echo 03-26-10: EF 55-60%; mild LVH; trivial MR; RVF ok; mild -mod circumferential Eff w/o tamponade  . Constrictive pericarditis   . OSA (obstructive sleep apnea)   . Gout     pt denies  . Family history of malignant neoplasm of gastrointestinal tract   . Allergic rhinitis, cause unspecified 09/14/2012  . DDD (degenerative disc disease), lumbar 09/14/2012  . Left sided sciatica 09/14/2012  . Erectile dysfunction 09/14/2012  . Hyperlipidemia 07/11/2013   Past Surgical History  Procedure Laterality Date  . Tonsillectomy and adenoidectomy      one tonsil  . Pericardectomy  07/15/2010    Dr. [name]  . Pleurx catheter placement  07/15/2010    Dr. [name]  . Shoulder surgery      right and left shoulders  . Knee surgery      right    reports that he has been smoking Cigarettes.  He has a 10 pack-year smoking history. He has never used smokeless  tobacco. He reports that he drinks alcohol. He reports that he does not use illicit drugs. family history includes Colon cancer in his father; Diabetes in his father, mother, and sister; Heart disease in his father; Hypertension in his other; Liver cancer in his paternal uncle; Pancreatitis in his father and paternal uncle; Stomach cancer in his maternal grandfather. There is no history of Esophageal cancer or Rectal cancer. Allergies  Allergen Reactions  . Diphenhydramine Itching, Palpitations and Other (See Comments)    "jittery"  . Other Itching and Swelling    Pecans: itching and swelling of the tongue   . Septra [Sulfamethoxazole-Trimethoprim] Itching  . Colchicine Diarrhea  . Lipitor [Atorvastatin] Other (See Comments)    Muscle cramps   Current Outpatient Prescriptions on File Prior to Visit  Medication Sig Dispense Refill  . amLODipine (NORVASC) 10 MG tablet Take 1 tablet (10 mg total) by mouth daily. 90 tablet 2  . aspirin 81 MG tablet     . benzonatate (TESSALON) 100 MG capsule TAKE 1 CAPSULE (100 MG TOTAL) BY MOUTH 2 (TWO) TIMES DAILY AS NEEDED FOR COUGH. 30 capsule 0  . Blood Glucose Monitoring Suppl (ACCU-CHEK NANO SMARTVIEW) W/DEVICE KIT Use glucose meter to check blood sugar 1-3 times daily as directed. 1 kit 0  . citalopram (CELEXA) 40 MG tablet Take  1 tablet (40 mg total) by mouth daily. 90 tablet 3  . clonazePAM (KLONOPIN) 0.5 MG tablet TAKE 1 TABLET BY MOUTH TWICE DAILY AS NEEDED FOR ANXIETY 60 tablet 1  . doxycycline (VIBRA-TABS) 100 MG tablet TAKE 1 TABLET (100 MG TOTAL) BY MOUTH 2 (TWO) TIMES DAILY. 20 tablet 0  . fenofibrate (TRICOR) 145 MG tablet TAKE ONE TABLET BY MOUTH DAILY. 90 tablet 1  . furosemide (LASIX) 20 MG tablet TAKE 1 TABLET (20 MG TOTAL) BY MOUTH DAILY. 90 tablet 2  . gabapentin (NEURONTIN) 100 MG capsule Take 1 capsule (100 mg total) by mouth daily. 90 capsule 3  . glucose blood (ACCU-CHEK SMARTVIEW) test strip Use as instructed 100 each 12  .  lisinopril (PRINIVIL,ZESTRIL) 20 MG tablet Take 1 tablet (20 mg total) by mouth 2 (two) times daily. 180 tablet 3  . metFORMIN (GLUCOPHAGE) 500 MG tablet TAKE 1 TABLET (500 MG TOTAL) BY MOUTH 2 (TWO) TIMES DAILY WITH A MEAL. 60 tablet 2  . methocarbamol (ROBAXIN) 750 MG tablet Take 1 tablet (750 mg total) by mouth 3 (three) times daily as needed for muscle spasms. 90 tablet 0  . oxyCODONE-acetaminophen (PERCOCET) 10-325 MG tablet TK 1 T PO Q 6 H PRN P  0  . potassium chloride (K-DUR) 10 MEQ tablet TAKE ONE TABLET BY MOUTH DAILY. 90 tablet 1  . rosuvastatin (CRESTOR) 20 MG tablet TAKE 1 TABLET (20 MG TOTAL) BY MOUTH DAILY. 90 tablet 2  . tadalafil (CIALIS) 5 MG tablet Take 1 tablet (5 mg total) by mouth daily. 30 tablet 0  . traMADol (ULTRAM) 50 MG tablet Take 1 tablet (50 mg total) by mouth every 8 (eight) hours as needed. 90 tablet 5  . VOLTAREN 1 % GEL Apply 2 g topically 4 (four) times daily. 100 g 5  . [DISCONTINUED] amoxicillin-clarithromycin-lansoprazole (PREVPAC) combo pack Take by mouth 2 (two) times daily. Follow package directions. 1 kit 0   No current facility-administered medications on file prior to visit.   Review of Systems  Constitutional: Negative for unusual diaphoresis or night sweats HENT: Negative for ear swelling or discharge Eyes: Negative for worsening visual haziness  Respiratory: Negative for choking and stridor.   Gastrointestinal: Negative for distension or worsening eructation Genitourinary: Negative for retention or change in urine volume.  Musculoskeletal: Negative for other MSK pain or swelling Skin: Negative for color change and worsening wound Neurological: Negative for tremors and numbness other than noted  Psychiatric/Behavioral: Negative for decreased concentration or agitation other than above       Objective:   Physical Exam BP 140/82 mmHg  Pulse 72  Resp 20  Wt 257 lb (116.574 kg)  SpO2 97% VS noted,  Constitutional: Pt appears in no apparent  distress HENT: Head: NCAT.  Right Ear: External ear normal.  Left Ear: External ear normal.  Eyes: . Pupils are equal, round, and reactive to light. Conjunctivae and EOM are normal Neck: Normal range of motion. Neck supple.  Cardiovascular: Normal rate and regular rhythm.   Pulmonary/Chest: Effort normal and breath sounds without rales or wheezing.  Abd:  Soft, NT, ND, + BS, no flank tender GU; no penile or genital ulcer or dc Neurological: Pt is alert. Not confused , motor grossly intact Skin: Skin is warm. No rash, no LE edema Psychiatric: Pt behavior is normal. No agitation.   POCT Urinalysis Dipstick  Status: Finalresult Visible to patient:  Not Released Dx:  Dysuria  Ref Range 11:37 AM (07/24/15) 32yrago (05/18/14) 158yrgo (11/19/13) 2y10yro (09/14/12)    Color, UA  yellow       Clarity, UA  cloudy       Glucose, UA  negative NEGATIVE      Bilirubin, UA  negative       Ketones, UA  negative       Spec Grav, UA  1.025       Blood, UA  negative       pH, UA  6.0       Protein, UA  +-       Urobilinogen, UA  negative 0.2 0.2 0.2    Nitrite, UA  negative       Leukocytes, UA Negative  small (1+) (A)               Assessment & Plan:

## 2015-07-26 LAB — URINE CULTURE
Colony Count: NO GROWTH
Organism ID, Bacteria: NO GROWTH

## 2015-07-26 NOTE — Assessment & Plan Note (Signed)
stable overall by history and exam, recent data reviewed with pt, and pt to continue medical treatment as before,  to f/u any worsening symptoms or concerns BP Readings from Last 3 Encounters:  07/24/15 140/82  03/04/15 138/80  02/26/15 169/92

## 2015-07-26 NOTE — Assessment & Plan Note (Signed)
Recently mild uncotrolled, symtpoms improved,  Lab Results  Component Value Date   HGBA1C 7.5* 02/17/2015   Follow DM diet, cont same tx

## 2015-07-26 NOTE — Assessment & Plan Note (Signed)
With recen exposure, for flagyl course,  to f/u any worsening symptoms or concerns

## 2015-07-26 NOTE — Assessment & Plan Note (Signed)
Mild to mod, c/w prob uti, for antibx course,  Urine culture, to f/u any worsening symptoms or concerns

## 2015-07-30 NOTE — Progress Notes (Signed)
Cardiology Office Note   Date:  07/31/2015   ID:  Harold [name], DOB Nov 21, 1965, MRN [id number]  PCP:  Mauricio Po, FNP  Cardiologist:   Minus Breeding, MD   Chief Complaint  Patient presents with  . Edema  . Shortness of Breath      History of Present Illness: Harold [name] is a 50 y.o. male who presents for Evaluation of multiple cardiovascular risk factors. I saw him in the past for pericarditis. He does have some mild shortness of breath with activity. He has some lower extremity swelling. He's been traveling back and forth between here in [place] and I haven't seen him in about 4 years. He's had no cardiovascular testing. His father developed coronary disease in his 34s died recently of heart disease. Patient is concerned about his risk. He does do some activities especially stationary bicycle. He might get short of breath with this. He does not have chest pressure, neck or arm discomfort. He doesn't have any palpitations, presyncope or syncope. He has no PND or orthopnea. He has not pain that he had previously with his pericarditis.  Past Medical History  Diagnosis Date  . Obesity   . Metabolic syndrome   . Hypertension   . Depression   . GERD (gastroesophageal reflux disease)   . Chronic back pain   . History of pancreatitis     a. admx 04-2009.Marland KitchenMarland Kitchen? 2-2 triglycerides  . Hypertriglyceridemia     a. followed by [facility]  . Acute pericarditis     admx 03-26-10 thru 03-29-10; a. echo 03-26-10: EF 55-60%; mild LVH; trivial MR; RVF ok; mild -mod circumferential Eff w/o tamponade  . Constrictive pericarditis   . OSA (obstructive sleep apnea)   . Gout     pt denies  . Family history of malignant neoplasm of gastrointestinal tract   . Allergic rhinitis, cause unspecified 09/14/2012  . DDD (degenerative disc disease), lumbar 09/14/2012  . Left sided sciatica 09/14/2012  . Erectile dysfunction 09/14/2012  . Hyperlipidemia 07/11/2013    Past Surgical History    Procedure Laterality Date  . Tonsillectomy and adenoidectomy      one tonsil  . Pericardectomy  07/15/2010    Dr. [name]  . Pleurx catheter placement  07/15/2010    Dr. [name]  . Shoulder surgery      right and left shoulders  . Knee surgery      right     Current Outpatient Prescriptions  Medication Sig Dispense Refill  . amLODipine (NORVASC) 10 MG tablet Take 1 tablet (10 mg total) by mouth daily. 90 tablet 2  . aspirin 81 MG tablet     . benzonatate (TESSALON) 100 MG capsule TAKE 1 CAPSULE (100 MG TOTAL) BY MOUTH 2 (TWO) TIMES DAILY AS NEEDED FOR COUGH. 30 capsule 0  . Blood Glucose Monitoring Suppl (ACCU-CHEK NANO SMARTVIEW) W/DEVICE KIT Use glucose meter to check blood sugar 1-3 times daily as directed. 1 kit 0  . ciprofloxacin (CIPRO) 500 MG tablet Take 1 tablet (500 mg total) by mouth 2 (two) times daily. 20 tablet 0  . citalopram (CELEXA) 40 MG tablet Take 1 tablet (40 mg total) by mouth daily. 90 tablet 3  . clonazePAM (KLONOPIN) 0.5 MG tablet TAKE 1 TABLET BY MOUTH TWICE DAILY AS NEEDED FOR ANXIETY 60 tablet 1  . doxycycline (VIBRA-TABS) 100 MG tablet TAKE 1 TABLET (100 MG TOTAL) BY MOUTH 2 (TWO) TIMES DAILY. 20 tablet 0  . fenofibrate (TRICOR) 145 MG  tablet TAKE ONE TABLET BY MOUTH DAILY. 90 tablet 1  . furosemide (LASIX) 20 MG tablet TAKE 1 TABLET (20 MG TOTAL) BY MOUTH DAILY. 90 tablet 2  . gabapentin (NEURONTIN) 100 MG capsule Take 1 capsule (100 mg total) by mouth daily. 90 capsule 3  . glucose blood (ACCU-CHEK SMARTVIEW) test strip Use as instructed 100 each 12  . lisinopril (PRINIVIL,ZESTRIL) 20 MG tablet Take 1 tablet (20 mg total) by mouth 2 (two) times daily. 180 tablet 3  . metFORMIN (GLUCOPHAGE) 500 MG tablet TAKE 1 TABLET (500 MG TOTAL) BY MOUTH 2 (TWO) TIMES DAILY WITH A MEAL. 60 tablet 2  . methocarbamol (ROBAXIN) 750 MG tablet Take 1 tablet (750 mg total) by mouth 3 (three) times daily as needed for muscle spasms. 90 tablet 0  . metroNIDAZOLE (FLAGYL) 250  MG tablet Take 1 tablet (250 mg total) by mouth 3 (three) times daily. 21 tablet 0  . oxyCODONE-acetaminophen (PERCOCET) 10-325 MG tablet TK 1 T PO Q 6 H PRN P  0  . potassium chloride (K-DUR) 10 MEQ tablet TAKE ONE TABLET BY MOUTH DAILY. 90 tablet 1  . rosuvastatin (CRESTOR) 20 MG tablet TAKE 1 TABLET (20 MG TOTAL) BY MOUTH DAILY. 90 tablet 2  . tadalafil (CIALIS) 5 MG tablet Take 1 tablet (5 mg total) by mouth daily. 30 tablet 0  . traMADol (ULTRAM) 50 MG tablet Take 1 tablet (50 mg total) by mouth every 8 (eight) hours as needed. 90 tablet 5  . VOLTAREN 1 % GEL Apply 2 g topically 4 (four) times daily. 100 g 5  . [DISCONTINUED] amoxicillin-clarithromycin-lansoprazole (PREVPAC) combo pack Take by mouth 2 (two) times daily. Follow package directions. 1 kit 0   No current facility-administered medications for this visit.    Allergies:   Diphenhydramine; Other; Septra; Colchicine; and Lipitor    Social History:  The patient  reports that he has been smoking Cigarettes.  He has a 10 pack-year smoking history. He has never used smokeless tobacco. He reports that he drinks alcohol. He reports that he does not use illicit drugs.   Family History:  The patient's family history includes Colon cancer in his father; Diabetes in his father, mother, and sister; Heart disease (age of onset: 94) in his father; Hypertension in his other; Liver cancer in his paternal uncle; Pancreatitis in his father and paternal uncle; Stomach cancer in his maternal grandfather. There is no history of Esophageal cancer or Rectal cancer.    ROS:  Please see the history of present illness.   Otherwise, review of systems are positive for none.   All other systems are reviewed and negative.    PHYSICAL EXAM: VS:  BP 164/94 mmHg  Pulse 78  Ht _0  (1.803 m)  Wt 260 lb (117.935 kg)  BMI 36.28 kg/m2 , BMI Body mass index is 36.28 kg/(m^2). GENERAL:  Well appearing HEENT:  Pupils equal round and reactive, fundi not  visualized, oral mucosa unremarkable NECK:  No jugular venous distention, waveform within normal limits, carotid upstroke brisk and symmetric, no bruits, no thyromegaly LYMPHATICS:  No cervical, inguinal adenopathy LUNGS:  Clear to auscultation bilaterally BACK:  No CVA tenderness CHEST:  Unremarkable HEART:  PMI not displaced or sustained,S1 and S2 within normal limits, no S3, no S4, no clicks, no rubs, no murmurs ABD:  Flat, positive bowel sounds normal in frequency in pitch, no bruits, no rebound, no guarding, no midline pulsatile mass, no hepatomegaly, no splenomegaly EXT:  2 plus pulses  throughout, no edema, no cyanosis no clubbing SKIN:  No rashes no nodules NEURO:  Cranial nerves II through XII grossly intact, motor grossly intact throughout PSYCH:  Cognitively intact, oriented to person place and time    EKG:  EKG is ordered today. The ekg ordered today demonstrates sinus rhythm, rate 78, axis rightward, poor anterior R wave progression, no acute ST-T wave changes.   Recent Labs: No results found for requested labs within last 365 days.    Lipid Panel    Component Value Date/Time   CHOL 146 11/19/2013 1206   TRIG 145.0 11/19/2013 1206   HDL 37.80* 11/19/2013 1206   CHOLHDL 4 11/19/2013 1206   VLDL 29.0 11/19/2013 1206   LDLCALC 79 11/19/2013 1206   LDLDIRECT 124.5 09/14/2012 1044      Wt Readings from Last 3 Encounters:  07/31/15 260 lb (117.935 kg)  07/24/15 257 lb (116.574 kg)  03/04/15 261 lb (118.389 kg)      Other studies Reviewed: Additional studies/ records that were reviewed today include: Labs. Review of the above records demonstrates:  Please see elsewhere in the note.     ASSESSMENT AND PLAN:  SOB:  With his risk factors and will bring him back for a POET (Plain Old Exercise Treadmill).  I will bring the patient back for a POET (Plain Old Exercise Test). This will allow me to screen for obstructive coronary disease, risk stratify and very  importantly provide a prescription for exercise.  OBESITY:  The patient understands the need to lose weight with diet and exercise. We have discussed specific strategies for this.  DM:  His A1c was 7.5 recently. However, he's changed his diet and his activity and thinks it slowly coming down.  RISK REDUCTION:  He will stop smoking.     Current medicines are reviewed at length with the patient today.  The patient does not have concerns regarding medicines.  The following changes have been made:  no change  Labs/ tests ordered today include:   Orders Placed This Encounter  Procedures  . Exercise Tolerance Test  . EKG 12-Lead     Disposition:   FU with me in 18 months. He    Signed, Minus Breeding, MD  07/31/2015 3:52 PM    [facility]

## 2015-07-31 ENCOUNTER — Encounter: Payer: Self-pay | Admitting: Cardiology

## 2015-07-31 ENCOUNTER — Ambulatory Visit (INDEPENDENT_AMBULATORY_CARE_PROVIDER_SITE_OTHER): Payer: Commercial Managed Care - HMO | Admitting: Cardiology

## 2015-07-31 ENCOUNTER — Other Ambulatory Visit: Payer: Self-pay | Admitting: Internal Medicine

## 2015-07-31 VITALS — BP 164/94 | HR 78 | Ht 71.0 in | Wt 260.0 lb

## 2015-07-31 DIAGNOSIS — R0602 Shortness of breath: Secondary | ICD-10-CM | POA: Diagnosis not present

## 2015-07-31 NOTE — Patient Instructions (Addendum)
Medication Instructions:  Continue current medications  Labwork: NONE  Testing/Procedures: Your physician has requested that you have an exercise tolerance test. For further information please visit [email or web address]. Please also follow instruction sheet, as given.  Follow-Up: Your physician wants you to follow-up in: 18 Months. You will receive a reminder letter in the mail two months in advance. If you don't receive a letter, please call our office to schedule the follow-up appointment.   Any Other Special Instructions Will Be Listed Below (If Applicable).   If you need a refill on your cardiac medications before your next appointment, please call your pharmacy.

## 2015-08-03 DIAGNOSIS — Z0100 Encounter for examination of eyes and vision without abnormal findings: Secondary | ICD-10-CM | POA: Diagnosis not present

## 2015-08-07 ENCOUNTER — Telehealth (HOSPITAL_COMMUNITY): Payer: Self-pay

## 2015-08-07 NOTE — Telephone Encounter (Signed)
Encounter complete. 

## 2015-08-08 ENCOUNTER — Other Ambulatory Visit: Payer: Self-pay | Admitting: Internal Medicine

## 2015-08-12 ENCOUNTER — Ambulatory Visit (HOSPITAL_COMMUNITY)
Admission: RE | Admit: 2015-08-12 | Discharge: 2015-08-12 | Disposition: A | Discharge status: Home / Self Care | Payer: Commercial Managed Care - HMO | Source: Ambulatory Visit | Attending: Cardiovascular Disease | Admitting: Cardiovascular Disease

## 2015-08-12 DIAGNOSIS — R0602 Shortness of breath: Secondary | ICD-10-CM | POA: Diagnosis not present

## 2015-08-12 DIAGNOSIS — R9439 Abnormal result of other cardiovascular function study: Secondary | ICD-10-CM | POA: Diagnosis not present

## 2015-08-12 LAB — EXERCISE TOLERANCE TEST
Estimated workload: 7 METS
Exercise duration (min): 5 min
MPHR: 172 {beats}/min
Peak HR: 129 {beats}/min
Percent HR: 75 %
RPE: 16
Rest HR: 84 {beats}/min

## 2015-08-13 ENCOUNTER — Ambulatory Visit: Payer: Commercial Managed Care - HMO | Admitting: Family

## 2015-08-18 ENCOUNTER — Telehealth: Payer: Self-pay | Admitting: Cardiology

## 2015-08-18 DIAGNOSIS — M4806 Spinal stenosis, lumbar region: Secondary | ICD-10-CM | POA: Diagnosis not present

## 2015-08-18 DIAGNOSIS — D1779 Benign lipomatous neoplasm of other sites: Secondary | ICD-10-CM | POA: Diagnosis not present

## 2015-08-20 ENCOUNTER — Encounter: Payer: Self-pay | Admitting: Family

## 2015-08-20 ENCOUNTER — Ambulatory Visit (INDEPENDENT_AMBULATORY_CARE_PROVIDER_SITE_OTHER): Payer: Commercial Managed Care - HMO | Admitting: Family

## 2015-08-20 ENCOUNTER — Other Ambulatory Visit: Payer: Self-pay | Admitting: Neurological Surgery

## 2015-08-20 VITALS — BP 128/86 | HR 71 | Temp 98.0°F | Resp 16 | Ht 71.0 in | Wt 264.0 lb

## 2015-08-20 DIAGNOSIS — E119 Type 2 diabetes mellitus without complications: Secondary | ICD-10-CM | POA: Diagnosis not present

## 2015-08-20 DIAGNOSIS — N529 Male erectile dysfunction, unspecified: Secondary | ICD-10-CM

## 2015-08-20 DIAGNOSIS — I10 Essential (primary) hypertension: Secondary | ICD-10-CM | POA: Diagnosis not present

## 2015-08-20 MED ORDER — POTASSIUM CHLORIDE ER 20 MEQ PO TBCR: 20.0000 meq | EXTENDED_RELEASE_TABLET | Freq: Every day | ORAL | Status: DC

## 2015-08-20 MED ORDER — FUROSEMIDE 40 MG PO TABS: 40.0000 mg | ORAL_TABLET | Freq: Every day | ORAL | Status: DC

## 2015-08-20 MED ORDER — SILDENAFIL CITRATE 20 MG PO TABS: 20.0000 mg | ORAL_TABLET | Freq: Every day | ORAL | Status: DC | PRN

## 2015-08-20 NOTE — Assessment & Plan Note (Signed)
Hypertension appears adequately controlled with current regimen. Denies symptoms of end organ damage and is below goal 140/90. Continue current dosage of furosemide 80 mg daily, potassium 20 mEq daily, amlodipine 10 mg daily, and lisinopril 40 mg daily. Continue to monitor blood pressure at home. Continue to monitor.

## 2015-08-20 NOTE — Assessment & Plan Note (Signed)
Type 2 diabetes remains slightly elevated with most recent A1c of 7.5. Obtain A1c to check current status. Maintained on Crestor and lisinopril for CAD risk reduction. Diabetic eye exam and foot exam or completed. Declines Pneumovax today. Continue to monitor blood sugars at home. Continue current dosage of metformin pending A1c results.

## 2015-08-20 NOTE — Patient Instructions (Addendum)
Thank you for choosing [facility].  Summary/Instructions:  Please continue to take your medication as prescribed.   Your prescription(s) have been submitted to your pharmacy or been printed and provided for you. Please take as directed and contact our office if you believe you are having problem(s) with the medication(s) or have any questions.  Please stop by the lab on the basement level of the building for your blood work. Your results will be released to [facility] (or called to you) after review, usually within 72 hours after test completion. If any changes need to be made, you will be notified at that same time.

## 2015-08-20 NOTE — Assessment & Plan Note (Signed)
Continues to experience erectile dysfunction with minimal success with Cialis. Discontinue Cialis. Start sildenafil. Discussed risks, proper usage, and side effects of medication. Follow-up pending trial of medication.

## 2015-08-20 NOTE — Telephone Encounter (Signed)
Closed Encounter  °

## 2015-08-20 NOTE — Progress Notes (Signed)
Subjective:    Patient ID: Harold [name], male    DOB: 07/08/1965, 50 y.o.   MRN: [id number]  Chief Complaint  Patient presents with  . Follow-up    A1c check    HPI:  Harold [name] is a 50 y.o. male who  has a past medical history of Obesity; Metabolic syndrome; Hypertension; Depression; GERD (gastroesophageal reflux disease); Chronic back pain; History of pancreatitis; Hypertriglyceridemia; Acute pericarditis; Constrictive pericarditis; OSA (obstructive sleep apnea); Gout; Family history of malignant neoplasm of gastrointestinal tract; Allergic rhinitis, cause unspecified (09/14/2012); DDD (degenerative disc disease), lumbar (09/14/2012); Left sided sciatica (09/14/2012); Erectile dysfunction (09/14/2012); and Hyperlipidemia (07/11/2013). and presents today for a follow up office visit.   1.) Type 2 diabetes - Currently maintained on metformin. Reports taking the medication as prescribed and denies adverse side effects. Blood sugars have been running around mid 120s. Denies symptoms of end organ damage.   Lab Results  Component Value Date   HGBA1C 7.5* 02/17/2015   2.) Hypertension - Currently maintained on furosemide and lisinopril. Reports taking the medication as prescribed and denies adverse side effects. Does express some erectile dysfunction as described below. Does not currently check his blood pressure at home. Denies further symptoms of end organ damage.  BP Readings from Last 3 Encounters:  08/20/15 128/86  07/31/15 164/94  07/24/15 140/82    3.) Erectile dysfunction - Continues to experience associated symptom of erectile dysfunction. Decreased erectile strength and occasional inability to obtain an erection. Previously prescribed Cialis which was ineffective. Would like to try Viagra.  Allergies  Allergen Reactions  . Diphenhydramine Itching, Palpitations and Other (See Comments)    "jittery"  . Other Itching and Swelling    Pecans: itching and swelling of the tongue    . Septra [Sulfamethoxazole-Trimethoprim] Itching  . Colchicine Diarrhea  . Lipitor [Atorvastatin] Other (See Comments)    Muscle cramps     Current Outpatient Prescriptions on File Prior to Visit  Medication Sig Dispense Refill  . amLODipine (NORVASC) 10 MG tablet Take 1 tablet (10 mg total) by mouth daily. 90 tablet 2  . aspirin 81 MG tablet     . benzonatate (TESSALON) 100 MG capsule TAKE 1 CAPSULE (100 MG TOTAL) BY MOUTH 2 (TWO) TIMES DAILY AS NEEDED FOR COUGH. 30 capsule 0  . Blood Glucose Monitoring Suppl (ACCU-CHEK NANO SMARTVIEW) W/DEVICE KIT Use glucose meter to check blood sugar 1-3 times daily as directed. 1 kit 0  . citalopram (CELEXA) 40 MG tablet TAKE 1 TABLET (40 MG TOTAL) BY MOUTH DAILY. 90 tablet 2  . clonazePAM (KLONOPIN) 0.5 MG tablet TAKE 1 TABLET BY MOUTH TWICE DAILY AS NEEDED FOR ANXIETY 60 tablet 1  . fenofibrate (TRICOR) 145 MG tablet TAKE ONE TABLET BY MOUTH DAILY. 90 tablet 1  . gabapentin (NEURONTIN) 100 MG capsule Take 1 capsule (100 mg total) by mouth daily. 90 capsule 3  . glucose blood (ACCU-CHEK SMARTVIEW) test strip Use as instructed 100 each 12  . lisinopril (PRINIVIL,ZESTRIL) 20 MG tablet Take 1 tablet (20 mg total) by mouth 2 (two) times daily. Overdue for yearly physical w/labs must see Marya Amsler for refills 60 tablet 0  . metFORMIN (GLUCOPHAGE) 500 MG tablet TAKE 1 TABLET (500 MG TOTAL) BY MOUTH 2 (TWO) TIMES DAILY WITH A MEAL. 60 tablet 2  . methocarbamol (ROBAXIN) 750 MG tablet Take 1 tablet (750 mg total) by mouth 3 (three) times daily as needed for muscle spasms. 90 tablet 0  . oxyCODONE-acetaminophen (PERCOCET) 10-325  MG tablet TK 1 T PO Q 6 H PRN P  0  . rosuvastatin (CRESTOR) 20 MG tablet TAKE 1 TABLET (20 MG TOTAL) BY MOUTH DAILY. 90 tablet 2  . traMADol (ULTRAM) 50 MG tablet Take 1 tablet (50 mg total) by mouth every 8 (eight) hours as needed. 90 tablet 5  . VOLTAREN 1 % GEL Apply 2 g topically 4 (four) times daily. 100 g 5  . [DISCONTINUED]  amoxicillin-clarithromycin-lansoprazole (PREVPAC) combo pack Take by mouth 2 (two) times daily. Follow package directions. 1 kit 0   No current facility-administered medications on file prior to visit.     Past Surgical History  Procedure Laterality Date  . Tonsillectomy and adenoidectomy      one tonsil  . Pericardectomy  07/15/2010    Dr. [name]  . Pleurx catheter placement  07/15/2010    Dr. [name]  . Shoulder surgery      right and left shoulders  . Knee surgery      right    Past Medical History  Diagnosis Date  . Obesity   . Metabolic syndrome   . Hypertension   . Depression   . GERD (gastroesophageal reflux disease)   . Chronic back pain   . History of pancreatitis     a. admx 04-2009.Marland KitchenMarland Kitchen? 2-2 triglycerides  . Hypertriglyceridemia     a. followed by [facility]  . Acute pericarditis     admx 03-26-10 thru 03-29-10; a. echo 03-26-10: EF 55-60%; mild LVH; trivial MR; RVF ok; mild -mod circumferential Eff w/o tamponade  . Constrictive pericarditis   . OSA (obstructive sleep apnea)   . Gout     pt denies  . Family history of malignant neoplasm of gastrointestinal tract   . Allergic rhinitis, cause unspecified 09/14/2012  . DDD (degenerative disc disease), lumbar 09/14/2012  . Left sided sciatica 09/14/2012  . Erectile dysfunction 09/14/2012  . Hyperlipidemia 07/11/2013     Review of Systems  Constitutional: Negative for fever and chills.  Eyes:       Negative for changes in vision  Respiratory: Negative for cough, chest tightness and wheezing.   Cardiovascular: Negative for chest pain, palpitations and leg swelling.  Endocrine: Negative for polydipsia, polyphagia and polyuria.  Genitourinary:       Positive for erectile dysfunction.  Neurological: Negative for dizziness, weakness and light-headedness.      Objective:    BP 128/86 mmHg  Pulse 71  Temp(Src) 98 F (36.7 C) (Oral)  Resp 16  Ht '5\' 11"'  (1.803 m)  Wt 264 lb (119.75 kg)  BMI 36.84 kg/m2   SpO2 96% Nursing note and vital signs reviewed.  Physical Exam  Constitutional: He is oriented to person, place, and time. He appears well-developed and well-nourished. No distress.  Cardiovascular: Normal rate, regular rhythm, normal heart sounds and intact distal pulses.   Pulmonary/Chest: Effort normal and breath sounds normal.  Neurological: He is alert and oriented to person, place, and time.  Diabetic foot exam - bilateral feet are free from skin breakdown, cuts, and abrasions. Toenails are neatly trimmed. Pulses are intact and appropriate. Sensation is intact to monofilament bilaterally.  Skin: Skin is warm and dry.  Psychiatric: He has a normal mood and affect. His behavior is normal. Judgment and thought content normal.       Assessment & Plan:   Problem List Items Addressed This Visit      Cardiovascular and Mediastinum   Essential hypertension    Hypertension appears adequately  controlled with current regimen. Denies symptoms of end organ damage and is below goal 140/90. Continue current dosage of furosemide 80 mg daily, potassium 20 mEq daily, amlodipine 10 mg daily, and lisinopril 40 mg daily. Continue to monitor blood pressure at home. Continue to monitor.      Relevant Medications   furosemide (LASIX) 40 MG tablet   potassium chloride 20 MEQ TBCR   sildenafil (REVATIO) 20 MG tablet   Other Relevant Orders   Comp Met (CMET)     Endocrine   Type 2 diabetes mellitus ([place]) - Primary    Type 2 diabetes remains slightly elevated with most recent A1c of 7.5. Obtain A1c to check current status. Maintained on Crestor and lisinopril for CAD risk reduction. Diabetic eye exam and foot exam or completed. Declines Pneumovax today. Continue to monitor blood sugars at home. Continue current dosage of metformin pending A1c results.      Relevant Orders   Hemoglobin A1c   Lipid Profile     Genitourinary   Erectile dysfunction    Continues to experience erectile dysfunction with  minimal success with Cialis. Discontinue Cialis. Start sildenafil. Discussed risks, proper usage, and side effects of medication. Follow-up pending trial of medication.      Relevant Medications   sildenafil (REVATIO) 20 MG tablet       I have discontinued Mr. Reser tadalafil, doxycycline, ciprofloxacin, and metroNIDAZOLE. I have changed his potassium chloride to Potassium Chloride ER. I have also changed his furosemide. Additionally, I am having him start on sildenafil. Lastly, I am having him maintain his clonazePAM, aspirin, traMADol, gabapentin, oxyCODONE-acetaminophen, methocarbamol, VOLTAREN, ACCU-CHEK NANO SMARTVIEW, glucose blood, metFORMIN, benzonatate, fenofibrate, rosuvastatin, amLODipine, citalopram, and lisinopril.   Meds ordered this encounter  Medications  . furosemide (LASIX) 40 MG tablet    Sig: Take 1 tablet (40 mg total) by mouth daily.    Dispense:  90 tablet    Refill:  1    CYCLE FILL MEDICATION. Authorization is required for next refill.    Order Specific Question:  Supervising Provider    Answer:  Pricilla Holm A [[id number]]  . potassium chloride 20 MEQ TBCR    Sig: Take 20 mEq by mouth daily.    Dispense:  90 tablet    Refill:  1    CYCLE FILL MEDICATION. Authorization is required for next refill.    Order Specific Question:  Supervising Provider    Answer:  Pricilla Holm A [[id number]]  . sildenafil (REVATIO) 20 MG tablet    Sig: Take 1-5 tablets (20-100 mg total) by mouth daily as needed.    Dispense:  50 tablet    Refill:  0    Order Specific Question:  Supervising Provider    Answer:  Pricilla Holm A [[id number]]     Follow-up: Return if symptoms worsen or fail to improve.  Mauricio Po, FNP

## 2015-08-20 NOTE — Progress Notes (Signed)
Pre visit review using our clinic review tool, if applicable. No additional management support is needed unless otherwise documented below in the visit note. 

## 2015-08-25 ENCOUNTER — Encounter (HOSPITAL_COMMUNITY): Payer: Self-pay

## 2015-08-25 ENCOUNTER — Encounter (HOSPITAL_COMMUNITY)
Admission: RE | Admit: 2015-08-25 | Discharge: 2015-08-25 | Disposition: A | Discharge status: Home / Self Care | Payer: Commercial Managed Care - HMO | Source: Ambulatory Visit | Attending: Neurological Surgery | Admitting: Neurological Surgery

## 2015-08-25 ENCOUNTER — Other Ambulatory Visit (HOSPITAL_COMMUNITY): Payer: Self-pay | Admitting: *Deleted

## 2015-08-25 DIAGNOSIS — E669 Obesity, unspecified: Secondary | ICD-10-CM | POA: Diagnosis present

## 2015-08-25 DIAGNOSIS — E785 Hyperlipidemia, unspecified: Secondary | ICD-10-CM | POA: Diagnosis present

## 2015-08-25 DIAGNOSIS — Z7984 Long term (current) use of oral hypoglycemic drugs: Secondary | ICD-10-CM | POA: Diagnosis not present

## 2015-08-25 DIAGNOSIS — E8881 Metabolic syndrome: Secondary | ICD-10-CM | POA: Diagnosis present

## 2015-08-25 DIAGNOSIS — R269 Unspecified abnormalities of gait and mobility: Secondary | ICD-10-CM | POA: Diagnosis not present

## 2015-08-25 DIAGNOSIS — M4726 Other spondylosis with radiculopathy, lumbar region: Secondary | ICD-10-CM | POA: Diagnosis not present

## 2015-08-25 DIAGNOSIS — M4806 Spinal stenosis, lumbar region: Secondary | ICD-10-CM | POA: Diagnosis present

## 2015-08-25 DIAGNOSIS — K219 Gastro-esophageal reflux disease without esophagitis: Secondary | ICD-10-CM | POA: Diagnosis present

## 2015-08-25 DIAGNOSIS — G4733 Obstructive sleep apnea (adult) (pediatric): Secondary | ICD-10-CM | POA: Diagnosis present

## 2015-08-25 DIAGNOSIS — M4727 Other spondylosis with radiculopathy, lumbosacral region: Secondary | ICD-10-CM | POA: Diagnosis present

## 2015-08-25 DIAGNOSIS — M549 Dorsalgia, unspecified: Secondary | ICD-10-CM | POA: Diagnosis not present

## 2015-08-25 DIAGNOSIS — Z981 Arthrodesis status: Secondary | ICD-10-CM | POA: Diagnosis not present

## 2015-08-25 DIAGNOSIS — E119 Type 2 diabetes mellitus without complications: Secondary | ICD-10-CM | POA: Diagnosis present

## 2015-08-25 DIAGNOSIS — E882 Lipomatosis, not elsewhere classified: Secondary | ICD-10-CM | POA: Diagnosis present

## 2015-08-25 DIAGNOSIS — I10 Essential (primary) hypertension: Secondary | ICD-10-CM | POA: Diagnosis present

## 2015-08-25 DIAGNOSIS — G2581 Restless legs syndrome: Secondary | ICD-10-CM | POA: Diagnosis present

## 2015-08-25 DIAGNOSIS — F17210 Nicotine dependence, cigarettes, uncomplicated: Secondary | ICD-10-CM | POA: Diagnosis present

## 2015-08-25 HISTORY — DX: Restless legs syndrome: G25.81

## 2015-08-25 HISTORY — DX: Anxiety disorder, unspecified: F41.9

## 2015-08-25 HISTORY — DX: Headache, unspecified: R51.9

## 2015-08-25 HISTORY — DX: Headache: R51

## 2015-08-25 LAB — CBC
HCT: 46 % (ref 39.0–52.0)
Hemoglobin: 15 g/dL (ref 13.0–17.0)
MCH: 30.8 pg (ref 26.0–34.0)
MCHC: 32.6 g/dL (ref 30.0–36.0)
MCV: 94.5 fL (ref 78.0–100.0)
Platelets: 381 10*3/uL (ref 150–400)
RBC: 4.87 MIL/uL (ref 4.22–5.81)
RDW: 12.4 % (ref 11.5–15.5)
WBC: 10.5 10*3/uL (ref 4.0–10.5)

## 2015-08-25 LAB — BASIC METABOLIC PANEL
Anion gap: 9 (ref 5–15)
BUN: <5 mg/dL — ABNORMAL LOW (ref 6–20)
CO2: 27 mmol/L (ref 22–32)
Calcium: 9.3 mg/dL (ref 8.9–10.3)
Chloride: 99 mmol/L — ABNORMAL LOW (ref 101–111)
Creatinine, Ser: 0.92 mg/dL (ref 0.61–1.24)
GFR calc Af Amer: >60 mL/min (ref >60)
GFR calc non Af Amer: >60 mL/min (ref >60)
Glucose, Bld: 234 mg/dL — ABNORMAL HIGH (ref 65–99)
Potassium: 3.8 mmol/L (ref 3.5–5.1)
Sodium: 135 mmol/L (ref 135–145)

## 2015-08-25 LAB — GLUCOSE, CAPILLARY: Glucose-Capillary: 260 mg/dL — ABNORMAL HIGH (ref 65–99)

## 2015-08-25 LAB — SURGICAL PCR SCREEN
MRSA, PCR: NEGATIVE
Staphylococcus aureus: NEGATIVE

## 2015-08-25 MED ORDER — CEFAZOLIN SODIUM-DEXTROSE 2-4 GM/100ML-% IV SOLN
2.0000 g | INTRAVENOUS | Status: AC
  Administered 2015-08-26: 2 g via INTRAVENOUS
  Filled 2015-08-25: qty 100

## 2015-08-25 NOTE — Progress Notes (Signed)
Pt states he's had pericarditis in the past and is followed by Dr. [name]. Denies any recent chest pain or sob (except with exertion due to weight). Pt is diabetic, thinks he had an A1C in February and it was "a little under 7". Pt's CBG today was 260 because he has not taken his Metformin yesterday or today because he thought he shouldn't prior to surgery. I told him he needed to take it today when he gets home, but not in the AM. He voiced understanding.

## 2015-08-25 NOTE — Pre-Procedure Instructions (Addendum)
Harold [name]  08/25/2015    Your procedure is scheduled on Wednesday, Aug 26, 2015 at 1:00 PM.   Report to [facility] Entrance "A" Admitting Office at 11:00 AM.   Call this number if you have problems the morning of surgery: [phone]    Remember:  Do not eat food or drink liquids after midnight tonight.  Take these medicines the morning of surgery with A SIP OF WATER: Amlodipine (Norvasc), Citalopram (Celexa), Gabapentin (Neurontin), Tramadol or Oxycodone - if needed.   How to Manage Your Diabetes Before Surgery   Why is it important to control my blood sugar before and after surgery?   Improving blood sugar levels before and after surgery helps healing and can limit problems.  A way of improving blood sugar control is eating a healthy diet by:  - Eating less sugar and carbohydrates  - Increasing activity/exercise  - Talk with your doctor about reaching your blood sugar goals  High blood sugars (greater than 180 mg/dL) can raise your risk of infections and slow down your recovery so you will need to focus on controlling your diabetes during the weeks before surgery.  Make sure that the doctor who takes care of your diabetes knows about your planned surgery including the date and location.  How do I manage my blood sugars before surgery?   Check your blood sugar the morning of your surgery when you wake up and every 2 hours until you get to the Short-Stay unit.  If your blood sugar is less than 70 mg/dL, you will need to treat for low blood sugar by:  Recheck blood sugar in 15 minutes after treatment (to make sure it is greater than 70 mg/dL).  If blood sugar is not greater than 70 mg/dL on re-check, call [phone] for further instructions.   Report your blood sugar to the Short-Stay nurse when you get to Short-Stay.  References:  [facility], 2007 "How to Manage your Diabetes Before and After Surgery".  What do I do about  my diabetes medications?   Do not take oral diabetes medicines (pills) the morning of surgery.   Do not wear jewelry.  Do not wear lotions, powders, or cologne.  You may wear deodorant.  Men may shave face and neck.  Do not bring valuables to the hospital.  [facility] is not responsible for any belongings or valuables.  Contacts, dentures or bridgework may not be worn into surgery.  Leave your suitcase in the car.  After surgery it may be brought to your room.  For patients admitted to the hospital, discharge time will be determined by your treatment team.  Special instructions:  [facility] - Preparing for Surgery  Before surgery, you can play an important role.  Because skin is not sterile, your skin needs to be as free of germs as possible.  You can reduce the number of germs on you skin by washing with CHG (chlorahexidine gluconate) soap before surgery.  CHG is an antiseptic cleaner which kills germs and bonds with the skin to continue killing germs even after washing.  Please DO NOT use if you have an allergy to CHG or antibacterial soaps.  If your skin becomes reddened/irritated stop using the CHG and inform your nurse when you arrive at Short Stay.  Do not shave (including legs and underarms) for at least 48 hours prior to the first CHG shower.  You may shave your face.  Please follow these  instructions carefully:   1.  Shower with CHG Soap the night before surgery and the                                morning of Surgery.  2.  If you choose to wash your hair, wash your hair first as usual with your       normal shampoo.  3.  After you shampoo, rinse your hair and body thoroughly to remove the                      Shampoo.  4.  Use CHG as you would any other liquid soap.  You can apply chg directly       to the skin and wash gently with scrungie or a clean washcloth.  5.  Apply the CHG Soap to your body ONLY FROM THE NECK DOWN.        Do not use on open wounds or open sores.   Avoid contact with your eyes, ears, mouth and genitals (private parts).  Wash genitals (private parts) with your normal soap.  6.  Wash thoroughly, paying special attention to the area where your surgery        will be performed.  7.  Thoroughly rinse your body with warm water from the neck down.  8.  DO NOT shower/wash with your normal soap after using and rinsing off       the CHG Soap.  9.  Pat yourself dry with a clean towel.            10.  Wear clean pajamas.            11.  Place clean sheets on your bed the night of your first shower and do not        sleep with pets.  Day of Surgery  Do not apply any lotions the morning of surgery.  Please wear clean clothes to the hospital.   Please read over the following fact sheets that you were given. Pain Booklet, Coughing and Deep Breathing, MRSA Information and Surgical Site Infection Prevention

## 2015-08-26 ENCOUNTER — Inpatient Hospital Stay (HOSPITAL_COMMUNITY)
Admission: RE | Admit: 2015-08-26 | Discharge: 2015-08-27 | DRG: 520 | Disposition: A | Discharge status: Home / Self Care | Payer: Commercial Managed Care - HMO | Source: Ambulatory Visit | Attending: Neurological Surgery | Admitting: Neurological Surgery

## 2015-08-26 ENCOUNTER — Inpatient Hospital Stay (HOSPITAL_COMMUNITY): Payer: Commercial Managed Care - HMO

## 2015-08-26 ENCOUNTER — Encounter (HOSPITAL_COMMUNITY)
Admission: RE | Disposition: A | Discharge status: Home / Self Care | Payer: Self-pay | Source: Ambulatory Visit | Attending: Neurological Surgery

## 2015-08-26 ENCOUNTER — Inpatient Hospital Stay (HOSPITAL_COMMUNITY): Payer: Commercial Managed Care - HMO | Admitting: Anesthesiology

## 2015-08-26 ENCOUNTER — Encounter (HOSPITAL_COMMUNITY): Payer: Self-pay | Admitting: *Deleted

## 2015-08-26 DIAGNOSIS — Z981 Arthrodesis status: Secondary | ICD-10-CM | POA: Diagnosis not present

## 2015-08-26 DIAGNOSIS — F17210 Nicotine dependence, cigarettes, uncomplicated: Secondary | ICD-10-CM | POA: Diagnosis present

## 2015-08-26 DIAGNOSIS — G2581 Restless legs syndrome: Secondary | ICD-10-CM | POA: Diagnosis present

## 2015-08-26 DIAGNOSIS — I10 Essential (primary) hypertension: Secondary | ICD-10-CM | POA: Diagnosis present

## 2015-08-26 DIAGNOSIS — Z7984 Long term (current) use of oral hypoglycemic drugs: Secondary | ICD-10-CM | POA: Diagnosis not present

## 2015-08-26 DIAGNOSIS — G4733 Obstructive sleep apnea (adult) (pediatric): Secondary | ICD-10-CM | POA: Diagnosis present

## 2015-08-26 DIAGNOSIS — E119 Type 2 diabetes mellitus without complications: Secondary | ICD-10-CM | POA: Diagnosis present

## 2015-08-26 DIAGNOSIS — M4806 Spinal stenosis, lumbar region: Secondary | ICD-10-CM | POA: Diagnosis present

## 2015-08-26 DIAGNOSIS — E669 Obesity, unspecified: Secondary | ICD-10-CM | POA: Diagnosis present

## 2015-08-26 DIAGNOSIS — E785 Hyperlipidemia, unspecified: Secondary | ICD-10-CM | POA: Diagnosis present

## 2015-08-26 DIAGNOSIS — E882 Lipomatosis, not elsewhere classified: Secondary | ICD-10-CM | POA: Diagnosis present

## 2015-08-26 DIAGNOSIS — M4727 Other spondylosis with radiculopathy, lumbosacral region: Principal | ICD-10-CM | POA: Diagnosis present

## 2015-08-26 DIAGNOSIS — K219 Gastro-esophageal reflux disease without esophagitis: Secondary | ICD-10-CM | POA: Diagnosis present

## 2015-08-26 DIAGNOSIS — E8881 Metabolic syndrome: Secondary | ICD-10-CM | POA: Diagnosis present

## 2015-08-26 DIAGNOSIS — D1779 Benign lipomatous neoplasm of other sites: Secondary | ICD-10-CM

## 2015-08-26 DIAGNOSIS — Z419 Encounter for procedure for purposes other than remedying health state, unspecified: Secondary | ICD-10-CM

## 2015-08-26 HISTORY — PX: LUMBAR LAMINECTOMY/DECOMPRESSION MICRODISCECTOMY: SHX5026

## 2015-08-26 HISTORY — DX: Type 2 diabetes mellitus without complications: E11.9

## 2015-08-26 LAB — GLUCOSE, CAPILLARY
Glucose-Capillary: 136 mg/dL — ABNORMAL HIGH (ref 65–99)
Glucose-Capillary: 142 mg/dL — ABNORMAL HIGH (ref 65–99)
Glucose-Capillary: 180 mg/dL — ABNORMAL HIGH (ref 65–99)

## 2015-08-26 LAB — HEMOGLOBIN A1C
Hgb A1c MFr Bld: 7 % — ABNORMAL HIGH (ref 4.8–5.6)
Mean Plasma Glucose: 154 mg/dL

## 2015-08-26 SURGERY — LUMBAR LAMINECTOMY/DECOMPRESSION MICRODISCECTOMY 4 LEVEL
Anesthesia: General | Site: Spine Lumbar

## 2015-08-26 MED ORDER — EPHEDRINE 5 MG/ML INJ
INTRAVENOUS | Status: AC
  Filled 2015-08-26: qty 10

## 2015-08-26 MED ORDER — OXYCODONE HCL 5 MG PO TABS
ORAL_TABLET | ORAL | Status: AC
  Filled 2015-08-26: qty 1

## 2015-08-26 MED ORDER — ALUM & MAG HYDROXIDE-SIMETH 200-200-20 MG/5ML PO SUSP: 30.0000 mL | Freq: Four times a day (QID) | ORAL | Status: DC | PRN

## 2015-08-26 MED ORDER — LIDOCAINE HCL (CARDIAC) 20 MG/ML IV SOLN
INTRAVENOUS | Status: DC | PRN
  Administered 2015-08-26: 75 mg via INTRAVENOUS

## 2015-08-26 MED ORDER — FENTANYL CITRATE (PF) 100 MCG/2ML IJ SOLN
INTRAMUSCULAR | Status: DC | PRN
  Administered 2015-08-26: 50 ug via INTRAVENOUS
  Administered 2015-08-26: 100 ug via INTRAVENOUS
  Administered 2015-08-26: 150 ug via INTRAVENOUS
  Administered 2015-08-26: 100 ug via INTRAVENOUS

## 2015-08-26 MED ORDER — ACETAMINOPHEN 10 MG/ML IV SOLN
INTRAVENOUS | Status: AC
  Administered 2015-08-26: 1000 mg via INTRAVENOUS
  Filled 2015-08-26: qty 100

## 2015-08-26 MED ORDER — THROMBIN 5000 UNITS EX SOLR
CUTANEOUS | Status: DC | PRN
  Administered 2015-08-26 (×2): 5000 [IU] via TOPICAL

## 2015-08-26 MED ORDER — FENOFIBRATE 54 MG PO TABS
54.0000 mg | ORAL_TABLET | Freq: Every day | ORAL | Status: DC
  Administered 2015-08-27: 54 mg via ORAL
  Filled 2015-08-26: qty 1

## 2015-08-26 MED ORDER — AMLODIPINE BESYLATE 10 MG PO TABS
10.0000 mg | ORAL_TABLET | Freq: Every day | ORAL | Status: DC
  Administered 2015-08-27: 10 mg via ORAL
  Filled 2015-08-26: qty 1

## 2015-08-26 MED ORDER — FENTANYL CITRATE (PF) 250 MCG/5ML IJ SOLN
INTRAMUSCULAR | Status: AC
  Filled 2015-08-26: qty 5

## 2015-08-26 MED ORDER — OXYCODONE HCL 5 MG PO TABS
5.0000 mg | ORAL_TABLET | ORAL | Status: DC | PRN
  Administered 2015-08-26 – 2015-08-27 (×4): 5 mg via ORAL
  Filled 2015-08-26 (×4): qty 1

## 2015-08-26 MED ORDER — CELECOXIB 200 MG PO CAPS
200.0000 mg | ORAL_CAPSULE | Freq: Two times a day (BID) | ORAL | Status: DC
  Administered 2015-08-27: 200 mg via ORAL
  Filled 2015-08-26 (×2): qty 1

## 2015-08-26 MED ORDER — OXYCODONE HCL 5 MG/5ML PO SOLN: 5.0000 mg | Freq: Once | ORAL | Status: AC | PRN

## 2015-08-26 MED ORDER — HYDROMORPHONE HCL 1 MG/ML IJ SOLN
INTRAMUSCULAR | Status: AC
  Filled 2015-08-26: qty 1

## 2015-08-26 MED ORDER — POTASSIUM CHLORIDE ER 10 MEQ PO TBCR
20.0000 meq | EXTENDED_RELEASE_TABLET | Freq: Every day | ORAL | Status: DC
  Administered 2015-08-26 – 2015-08-27 (×2): 20 meq via ORAL
  Filled 2015-08-26 (×4): qty 2

## 2015-08-26 MED ORDER — ROSUVASTATIN CALCIUM 20 MG PO TABS: 20.0000 mg | ORAL_TABLET | Freq: Every day | ORAL | Status: DC

## 2015-08-26 MED ORDER — SUCCINYLCHOLINE CHLORIDE 200 MG/10ML IV SOSY
PREFILLED_SYRINGE | INTRAVENOUS | Status: AC
  Filled 2015-08-26: qty 10

## 2015-08-26 MED ORDER — LIDOCAINE 2% (20 MG/ML) 5 ML SYRINGE
INTRAMUSCULAR | Status: AC
  Filled 2015-08-26: qty 5

## 2015-08-26 MED ORDER — LACTATED RINGERS IV SOLN
INTRAVENOUS | Status: DC
  Administered 2015-08-26: 12:00:00 via INTRAVENOUS

## 2015-08-26 MED ORDER — FLEET ENEMA 7-19 GM/118ML RE ENEM: 1.0000 | ENEMA | Freq: Once | RECTAL | Status: DC | PRN

## 2015-08-26 MED ORDER — VANCOMYCIN HCL 1000 MG IV SOLR
INTRAVENOUS | Status: AC
  Filled 2015-08-26: qty 1000

## 2015-08-26 MED ORDER — CELECOXIB 200 MG PO CAPS: 200.0000 mg | ORAL_CAPSULE | Freq: Two times a day (BID) | ORAL | Status: DC

## 2015-08-26 MED ORDER — FUROSEMIDE 40 MG PO TABS
40.0000 mg | ORAL_TABLET | Freq: Every day | ORAL | Status: DC
  Administered 2015-08-27: 40 mg via ORAL
  Filled 2015-08-26: qty 1

## 2015-08-26 MED ORDER — METHOCARBAMOL 750 MG PO TABS
750.0000 mg | ORAL_TABLET | Freq: Four times a day (QID) | ORAL | Status: DC
  Administered 2015-08-26 – 2015-08-27 (×3): 750 mg via ORAL
  Filled 2015-08-26 (×3): qty 1

## 2015-08-26 MED ORDER — BISACODYL 10 MG RE SUPP: 10.0000 mg | Freq: Every day | RECTAL | Status: DC | PRN

## 2015-08-26 MED ORDER — PANTOPRAZOLE SODIUM 40 MG PO TBEC
40.0000 mg | DELAYED_RELEASE_TABLET | Freq: Every day | ORAL | Status: DC
  Administered 2015-08-26 – 2015-08-27 (×2): 40 mg via ORAL
  Filled 2015-08-26 (×2): qty 1

## 2015-08-26 MED ORDER — MIDAZOLAM HCL 2 MG/2ML IJ SOLN
INTRAMUSCULAR | Status: AC
  Filled 2015-08-26: qty 2

## 2015-08-26 MED ORDER — HEMOSTATIC AGENTS (NO CHARGE) OPTIME
TOPICAL | Status: DC | PRN
  Administered 2015-08-26: 1 via TOPICAL

## 2015-08-26 MED ORDER — ROCURONIUM BROMIDE 50 MG/5ML IV SOLN
INTRAVENOUS | Status: AC
  Filled 2015-08-26: qty 3

## 2015-08-26 MED ORDER — OXYCODONE HCL ER 10 MG PO T12A
20.0000 mg | EXTENDED_RELEASE_TABLET | Freq: Two times a day (BID) | ORAL | Status: DC
Start: 2015-08-26 — End: 2015-08-27
  Administered 2015-08-26 – 2015-08-27 (×2): 20 mg via ORAL
  Filled 2015-08-26 (×2): qty 2

## 2015-08-26 MED ORDER — ACETAMINOPHEN 500 MG PO TABS
1000.0000 mg | ORAL_TABLET | Freq: Four times a day (QID) | ORAL | Status: DC
  Administered 2015-08-26 – 2015-08-27 (×2): 1000 mg via ORAL
  Filled 2015-08-26 (×2): qty 2

## 2015-08-26 MED ORDER — LACTATED RINGERS IV SOLN
INTRAVENOUS | Status: DC | PRN
  Administered 2015-08-26 (×3): via INTRAVENOUS

## 2015-08-26 MED ORDER — PHENOL 1.4 % MT LIQD: 1.0000 | OROMUCOSAL | Status: DC | PRN

## 2015-08-26 MED ORDER — METHYLPREDNISOLONE ACETATE 80 MG/ML IJ SUSP
INTRAMUSCULAR | Status: DC | PRN
  Administered 2015-08-26: 80 mg

## 2015-08-26 MED ORDER — SUCCINYLCHOLINE CHLORIDE 20 MG/ML IJ SOLN
INTRAMUSCULAR | Status: DC | PRN
  Administered 2015-08-26: 140 mg via INTRAVENOUS

## 2015-08-26 MED ORDER — BUPIVACAINE HCL (PF) 0.25 % IJ SOLN
INTRAMUSCULAR | Status: DC | PRN
  Administered 2015-08-26: 15 mL
  Administered 2015-08-26: 1 mL

## 2015-08-26 MED ORDER — LORATADINE 10 MG PO TABS
10.0000 mg | ORAL_TABLET | Freq: Every day | ORAL | Status: DC
  Administered 2015-08-27: 10 mg via ORAL
  Filled 2015-08-26: qty 1

## 2015-08-26 MED ORDER — VANCOMYCIN HCL 1000 MG IV SOLR
INTRAVENOUS | Status: DC | PRN
  Administered 2015-08-26: 1000 mg via TOPICAL

## 2015-08-26 MED ORDER — PROPOFOL 10 MG/ML IV BOLUS
INTRAVENOUS | Status: AC
  Filled 2015-08-26: qty 20

## 2015-08-26 MED ORDER — GABAPENTIN 300 MG PO CAPS
300.0000 mg | ORAL_CAPSULE | Freq: Three times a day (TID) | ORAL | Status: DC
  Administered 2015-08-26 – 2015-08-27 (×2): 300 mg via ORAL
  Filled 2015-08-26 (×2): qty 1

## 2015-08-26 MED ORDER — DOCUSATE SODIUM 100 MG PO CAPS
100.0000 mg | ORAL_CAPSULE | Freq: Two times a day (BID) | ORAL | Status: DC
  Administered 2015-08-26 – 2015-08-27 (×2): 100 mg via ORAL
  Filled 2015-08-26 (×2): qty 1

## 2015-08-26 MED ORDER — THROMBIN 5000 UNITS EX SOLR
OROMUCOSAL | Status: DC | PRN
  Administered 2015-08-26 (×3): via TOPICAL

## 2015-08-26 MED ORDER — SODIUM CHLORIDE 0.9 % IV SOLN: 250.0000 mL | INTRAVENOUS | Status: DC

## 2015-08-26 MED ORDER — PROPOFOL 10 MG/ML IV BOLUS
INTRAVENOUS | Status: DC | PRN
  Administered 2015-08-26: 220 mg via INTRAVENOUS

## 2015-08-26 MED ORDER — METHOCARBAMOL 750 MG PO TABS
750.0000 mg | ORAL_TABLET | Freq: Three times a day (TID) | ORAL | Status: DC | PRN
Start: 2015-08-26 — End: 2015-08-27
  Filled 2015-08-26: qty 1

## 2015-08-26 MED ORDER — PHENYLEPHRINE 40 MCG/ML (10ML) SYRINGE FOR IV PUSH (FOR BLOOD PRESSURE SUPPORT)
PREFILLED_SYRINGE | INTRAVENOUS | Status: AC
  Filled 2015-08-26: qty 30

## 2015-08-26 MED ORDER — LIDOCAINE-EPINEPHRINE 2 %-1:100000 IJ SOLN
INTRAMUSCULAR | Status: DC | PRN
  Administered 2015-08-26: 15 mL via INTRADERMAL

## 2015-08-26 MED ORDER — MENTHOL 3 MG MT LOZG: 1.0000 | LOZENGE | OROMUCOSAL | Status: DC | PRN

## 2015-08-26 MED ORDER — EPHEDRINE SULFATE 50 MG/ML IJ SOLN
INTRAMUSCULAR | Status: DC | PRN
  Administered 2015-08-26: 10 mg via INTRAVENOUS
  Administered 2015-08-26: 5 mg via INTRAVENOUS

## 2015-08-26 MED ORDER — ROCURONIUM BROMIDE 100 MG/10ML IV SOLN
INTRAVENOUS | Status: DC | PRN
  Administered 2015-08-26: 10 mg via INTRAVENOUS
  Administered 2015-08-26: 50 mg via INTRAVENOUS

## 2015-08-26 MED ORDER — SODIUM CHLORIDE 0.9 % IR SOLN
Status: DC | PRN
  Administered 2015-08-26: 15:00:00

## 2015-08-26 MED ORDER — OXYCODONE HCL 5 MG PO TABS
5.0000 mg | ORAL_TABLET | Freq: Once | ORAL | Status: AC | PRN
  Administered 2015-08-26: 5 mg via ORAL

## 2015-08-26 MED ORDER — SODIUM CHLORIDE 0.9% FLUSH: 3.0000 mL | INTRAVENOUS | Status: DC | PRN

## 2015-08-26 MED ORDER — SODIUM CHLORIDE 0.9% FLUSH: 3.0000 mL | Freq: Two times a day (BID) | INTRAVENOUS | Status: DC

## 2015-08-26 MED ORDER — ONDANSETRON HCL 4 MG/2ML IJ SOLN: 4.0000 mg | INTRAMUSCULAR | Status: DC | PRN

## 2015-08-26 MED ORDER — SODIUM CHLORIDE 0.9 % IV SOLN
INTRAVENOUS | Status: DC | PRN
  Administered 2015-08-26: 30 mL

## 2015-08-26 MED ORDER — POTASSIUM CHLORIDE IN NACL 20-0.9 MEQ/L-% IV SOLN
100.0000 mL/h | INTRAVENOUS | Status: DC
  Filled 2015-08-26 (×3): qty 1000

## 2015-08-26 MED ORDER — BUPIVACAINE LIPOSOME 1.3 % IJ SUSP
20.0000 mL | INTRAMUSCULAR | Status: DC
  Filled 2015-08-26: qty 20

## 2015-08-26 MED ORDER — PANTOPRAZOLE SODIUM 40 MG IV SOLR: 40.0000 mg | Freq: Every day | INTRAVENOUS | Status: DC

## 2015-08-26 MED ORDER — DICLOFENAC SODIUM 1 % TD GEL
2.0000 g | Freq: Four times a day (QID) | TRANSDERMAL | Status: DC
  Administered 2015-08-26: 2 g via TOPICAL
  Filled 2015-08-26: qty 100

## 2015-08-26 MED ORDER — CEFAZOLIN SODIUM 1-5 GM-% IV SOLN
1.0000 g | Freq: Three times a day (TID) | INTRAVENOUS | Status: AC
Start: 2015-08-26 — End: 2015-08-27
  Administered 2015-08-26 – 2015-08-27 (×2): 1 g via INTRAVENOUS
  Filled 2015-08-26 (×2): qty 50

## 2015-08-26 MED ORDER — SENNA 8.6 MG PO TABS
1.0000 | ORAL_TABLET | Freq: Two times a day (BID) | ORAL | Status: DC
  Administered 2015-08-26 – 2015-08-27 (×2): 8.6 mg via ORAL
  Filled 2015-08-26 (×2): qty 1

## 2015-08-26 MED ORDER — PHENYLEPHRINE HCL 10 MG/ML IJ SOLN
INTRAMUSCULAR | Status: DC | PRN
  Administered 2015-08-26 (×3): 80 ug via INTRAVENOUS

## 2015-08-26 MED ORDER — ONDANSETRON HCL 4 MG/2ML IJ SOLN: 4.0000 mg | Freq: Four times a day (QID) | INTRAMUSCULAR | Status: DC | PRN

## 2015-08-26 MED ORDER — 0.9 % SODIUM CHLORIDE (POUR BTL) OPTIME
TOPICAL | Status: DC | PRN
  Administered 2015-08-26: 1000 mL

## 2015-08-26 MED ORDER — KETOROLAC TROMETHAMINE 30 MG/ML IJ SOLN
INTRAMUSCULAR | Status: AC
  Filled 2015-08-26: qty 1

## 2015-08-26 MED ORDER — GLYCOPYRROLATE 0.2 MG/ML IV SOSY
PREFILLED_SYRINGE | INTRAVENOUS | Status: AC
  Filled 2015-08-26: qty 3

## 2015-08-26 MED ORDER — CLONAZEPAM 0.5 MG PO TABS: 0.5000 mg | ORAL_TABLET | Freq: Three times a day (TID) | ORAL | Status: DC | PRN

## 2015-08-26 MED ORDER — KETOROLAC TROMETHAMINE 30 MG/ML IJ SOLN
INTRAMUSCULAR | Status: DC | PRN
  Administered 2015-08-26: 30 mg via INTRAVENOUS

## 2015-08-26 MED ORDER — CITALOPRAM HYDROBROMIDE 40 MG PO TABS
40.0000 mg | ORAL_TABLET | Freq: Every day | ORAL | Status: DC
  Administered 2015-08-27: 40 mg via ORAL
  Filled 2015-08-26: qty 1

## 2015-08-26 MED ORDER — SUGAMMADEX SODIUM 500 MG/5ML IV SOLN
INTRAVENOUS | Status: DC | PRN
  Administered 2015-08-26: 479.2 mg via INTRAVENOUS

## 2015-08-26 MED ORDER — METFORMIN HCL 500 MG PO TABS
500.0000 mg | ORAL_TABLET | Freq: Two times a day (BID) | ORAL | Status: DC
  Administered 2015-08-27: 500 mg via ORAL
  Filled 2015-08-26: qty 1

## 2015-08-26 MED ORDER — LISINOPRIL 20 MG PO TABS
20.0000 mg | ORAL_TABLET | Freq: Two times a day (BID) | ORAL | Status: DC
Start: 2015-08-26 — End: 2015-08-27
  Administered 2015-08-26 – 2015-08-27 (×2): 20 mg via ORAL
  Filled 2015-08-26 (×2): qty 1

## 2015-08-26 MED ORDER — HYDROMORPHONE HCL 1 MG/ML IJ SOLN
0.2500 mg | INTRAMUSCULAR | Status: DC | PRN
  Administered 2015-08-26 (×4): 0.5 mg via INTRAVENOUS

## 2015-08-26 MED ORDER — ALBUMIN HUMAN 5 % IV SOLN
INTRAVENOUS | Status: DC | PRN
  Administered 2015-08-26 (×2): via INTRAVENOUS

## 2015-08-26 SURGICAL SUPPLY — 72 items
BAG DECANTER FOR FLEXI CONT (MISCELLANEOUS) ×2 IMPLANT
BENZOIN TINCTURE PRP APPL 2/3 (GAUZE/BANDAGES/DRESSINGS) IMPLANT
BIT DRILL NEURO 2X3.1 SFT TUCH (MISCELLANEOUS) IMPLANT
BLADE CLIPPER SURG (BLADE) IMPLANT
BLADE SURG 11 STRL SS (BLADE) ×2 IMPLANT
BUR MATCHSTICK NEURO 3.0X3.8 (BURR) ×2 IMPLANT
BUR ROUND FLUTED 5 RND (BURR) ×4 IMPLANT
CANISTER SUCT 3000ML PPV (MISCELLANEOUS) ×2 IMPLANT
CHLORAPREP W/TINT 26ML (MISCELLANEOUS) ×2 IMPLANT
DECANTER SPIKE VIAL GLASS SM (MISCELLANEOUS) ×4 IMPLANT
DERMABOND ADVANCED (GAUZE/BANDAGES/DRESSINGS) ×1
DERMABOND ADVANCED .7 DNX12 (GAUZE/BANDAGES/DRESSINGS) ×1 IMPLANT
DRAPE MICROSCOPE LEICA (MISCELLANEOUS) ×2 IMPLANT
DRAPE POUCH INSTRU U-SHP 10X18 (DRAPES) ×2 IMPLANT
DRAPE PROXIMA HALF (DRAPES) ×2 IMPLANT
DRAPE SURG 17X23 STRL (DRAPES) ×2 IMPLANT
DRILL NEURO 2X3.1 SOFT TOUCH (MISCELLANEOUS)
DRSG OPSITE 4X5.5 SM (GAUZE/BANDAGES/DRESSINGS) ×2 IMPLANT
DRSG OPSITE POSTOP 4X8 (GAUZE/BANDAGES/DRESSINGS) ×2 IMPLANT
ELECT BLADE 4.0 EZ CLEAN MEGAD (MISCELLANEOUS) ×2
ELECT REM PT RETURN 9FT ADLT (ELECTROSURGICAL) ×2
ELECTRODE BLDE 4.0 EZ CLN MEGD (MISCELLANEOUS) ×1 IMPLANT
ELECTRODE REM PT RTRN 9FT ADLT (ELECTROSURGICAL) ×1 IMPLANT
EVACUATOR 1/8 PVC DRAIN (DRAIN) ×2 IMPLANT
GAUZE SPONGE 4X4 12PLY STRL (GAUZE/BANDAGES/DRESSINGS) ×2 IMPLANT
GAUZE SPONGE 4X4 16PLY XRAY LF (GAUZE/BANDAGES/DRESSINGS) ×2 IMPLANT
GLOVE BIOGEL PI IND STRL 7.0 (GLOVE) ×3 IMPLANT
GLOVE BIOGEL PI IND STRL 7.5 (GLOVE) ×2 IMPLANT
GLOVE BIOGEL PI INDICATOR 7.0 (GLOVE) ×3
GLOVE BIOGEL PI INDICATOR 7.5 (GLOVE) ×2
GLOVE EXAM NITRILE LRG STRL (GLOVE) IMPLANT
GLOVE EXAM NITRILE MD LF STRL (GLOVE) IMPLANT
GLOVE EXAM NITRILE XL STR (GLOVE) IMPLANT
GLOVE EXAM NITRILE XS STR PU (GLOVE) IMPLANT
GLOVE SS BIOGEL STRL SZ 7 (GLOVE) ×2 IMPLANT
GLOVE SUPERSENSE BIOGEL SZ 7 (GLOVE) ×2
GLOVE SURG SS PI 7.0 STRL IVOR (GLOVE) ×2 IMPLANT
GOWN STRL REUS W/ TWL LRG LVL3 (GOWN DISPOSABLE) ×1 IMPLANT
GOWN STRL REUS W/ TWL XL LVL3 (GOWN DISPOSABLE) ×1 IMPLANT
GOWN STRL REUS W/TWL LRG LVL3 (GOWN DISPOSABLE) ×1
GOWN STRL REUS W/TWL XL LVL3 (GOWN DISPOSABLE) ×1
HEMOSTAT POWDER KIT SURGIFOAM (HEMOSTASIS) ×6 IMPLANT
KIT BASIN OR (CUSTOM PROCEDURE TRAY) ×2 IMPLANT
KIT ROOM TURNOVER OR (KITS) ×2 IMPLANT
NEEDLE HYPO 18GX1.5 BLUNT FILL (NEEDLE) ×2 IMPLANT
NEEDLE HYPO 21X1.5 SAFETY (NEEDLE) ×4 IMPLANT
NEEDLE HYPO 25X1 1.5 SAFETY (NEEDLE) IMPLANT
NS IRRIG 1000ML POUR BTL (IV SOLUTION) ×2 IMPLANT
PACK LAMINECTOMY NEURO (CUSTOM PROCEDURE TRAY) ×2 IMPLANT
PACK UNIVERSAL I (CUSTOM PROCEDURE TRAY) ×2 IMPLANT
PAD ARMBOARD 7.5X6 YLW CONV (MISCELLANEOUS) ×10 IMPLANT
PATTIES SURGICAL .5X1.5 (GAUZE/BANDAGES/DRESSINGS) ×2 IMPLANT
RUBBERBAND STERILE (MISCELLANEOUS) ×4 IMPLANT
SEALER BIPOLAR AQUA 2.3 (INSTRUMENTS) ×2 IMPLANT
SPONGE SURGIFOAM ABS GEL SZ50 (HEMOSTASIS) ×2 IMPLANT
STRATAFIX 0 ×2 IMPLANT
STRIP CLOSURE SKIN 1/2X4 (GAUZE/BANDAGES/DRESSINGS) IMPLANT
SUT STRATAFIX 1PDS 45CM VIOLET (SUTURE) ×2 IMPLANT
SUT STRATAFIX MNCRL+ 3-0 PS-2 (SUTURE) ×1
SUT STRATAFIX MONOCRYL 3-0 (SUTURE) ×1
SUT STRATAFIX SPIRAL + 2-0 (SUTURE) ×2 IMPLANT
SUT VIC AB 0 CT1 18XCR BRD8 (SUTURE) IMPLANT
SUT VIC AB 0 CT1 8-18 (SUTURE)
SUT VIC AB 2-0 CT1 18 (SUTURE) IMPLANT
SUT VIC AB 4-0 PS2 27 (SUTURE) IMPLANT
SUTURE STRATFX MNCRL+ 3-0 PS-2 (SUTURE) ×1 IMPLANT
SYR 30ML LL (SYRINGE) ×4 IMPLANT
SYR 5ML LL (SYRINGE) ×2 IMPLANT
TOWEL OR 17X24 6PK STRL BLUE (TOWEL DISPOSABLE) ×2 IMPLANT
TOWEL OR 17X26 10 PK STRL BLUE (TOWEL DISPOSABLE) ×2 IMPLANT
TUBE CONNECTING 12X1/4 (SUCTIONS) IMPLANT
WATER STERILE IRR 1000ML POUR (IV SOLUTION) ×2 IMPLANT

## 2015-08-26 NOTE — Transfer of Care (Signed)
Immediate Anesthesia Transfer of Care Note  Patient: Harold [name]  Procedure(s) Performed: Procedure(s): Lumbar Two-Sacral One  Laminectomy for decompression (N/A)  Patient Location: PACU  Anesthesia Type:General  Level of Consciousness: awake, alert  and oriented  Airway & Oxygen Therapy: Patient Spontanous Breathing and Patient connected to nasal cannula oxygen  Post-op Assessment: Report given to RN and Post -op Vital signs reviewed and stable  Post vital signs: Reviewed and stable  Last Vitals:  Filed Vitals:   08/26/15 1129  BP: 153/83  Pulse: 67  Temp: 36.9 C  Resp: 18    Last Pain:  Filed Vitals:   08/26/15 1130  PainSc: 7       Patients Stated Pain Goal: 5 ([id number])  Complications: No apparent anesthesia complications

## 2015-08-26 NOTE — Op Note (Signed)
08/26/2015  5:25 PM  PATIENT:  Harold [name]  50 y.o. male  PRE-OPERATIVE DIAGNOSIS:  Lumbosacral spondylosis with radiculopathy, lumbar stenosis L2-S1, epidural lipomatosis L2-S1  POST-OPERATIVE DIAGNOSIS:  Same   PROCEDURE:  L2-S1 laminectomy for decompression  SURGEON:  Aldean Ast, MD  ASSISTANTS: Kary Kos, MD  ANESTHESIA:   General   DRAINS: Medium Hemovac   SPECIMEN:  None   INDICATION FOR PROCEDURE: 50 year old male with back and leg pain consistent with radiculopathy and neurogenic claudication.  He has profound lumbar epidural lipomatosis. Patient understood the risks, benefits, and alternatives and potential outcomes and wished to proceed.  PROCEDURE DETAILS: After smooth induction of general endotracheal anesthesia the patient was turned prone on the operating table on a Wilson frame. The skin of the lumbar region was clipped of hair. It was wiped down with alcohol. The patient was then prepped and draped in usual sterile fashion.   The subcutaneous tissues of the midline from approximately L2-L5 was infiltrated with Marcaine. The skin in this area was opened sharply. The soft tissues were dissected with monopolar cautery. Subperiosteal dissection was carried out along the sides of the spinous processes and the laminar surfaces to the medial edge of the facet joints from L2-S1. The spinous processes were removed with rongeurs. The laminae were then thinned with a high-speed bur. The remaining lamina was resected with a Kerrison punch. Thickened ligamentum flavum was separated from the dura and resected with a Kerrison rongeur. The thecal sac was further freed from the hypertrophied ligament in the lateral recesses. There was a large amount of fat in the epidural space below the ligament.   Lateral recess decompression was completed. Decompression was carried out laterally to the foramina. The foramina were palpated and found to be without residual stenosis.   I  irrigated vigorously with bacitracin saline. There was excellent hemostasis. A medium hemovac drain was placed below the fascia.  I placed vancomycin powder in the depths of the wounds.The wound was then closed in routine anatomic layers with running stratafix suture. I injected exparel in the paraspinous muscles.  The skin was closed with a running Monocryl strata fix suture. It was then sealed with Dermabond. The patient was turned to the supine position and allowed to awaken from anesthesia.  PATIENT DISPOSITION: PACU - hemodynamically stable.  Delay start of Pharmacological VTE agent (>24hrs) due to surgical blood loss or risk of bleeding: yes

## 2015-08-26 NOTE — H&P (Signed)
CC:  No chief complaint on file.   HPI: Harold [name] is a 50 y.o. male with neurogenic claudication due to lumbar epidural lipomatosis.  He presents for lumbar laminectomy for decompression.  PMH: Past Medical History  Diagnosis Date  . Obesity   . Metabolic syndrome   . Hypertension   . Depression   . GERD (gastroesophageal reflux disease)   . Chronic back pain   . History of pancreatitis     a. admx 04-2009.Marland KitchenMarland Kitchen? 2-2 triglycerides  . Hypertriglyceridemia     a. followed by [facility]  . Acute pericarditis     admx 03-26-10 thru 03-29-10; a. echo 03-26-10: EF 55-60%; mild LVH; trivial MR; RVF ok; mild -mod circumferential Eff w/o tamponade  . Constrictive pericarditis   . Gout     pt denies  . Family history of malignant neoplasm of gastrointestinal tract   . Allergic rhinitis, cause unspecified 09/14/2012  . DDD (degenerative disc disease), lumbar 09/14/2012  . Left sided sciatica 09/14/2012  . Erectile dysfunction 09/14/2012  . Hyperlipidemia 07/11/2013  . OSA (obstructive sleep apnea)     does not use cpap  . Anxiety   . Headache     migraines in the past  . Restless legs   . Diabetes mellitus without complication (HCC)     PSH: Past Surgical History  Procedure Laterality Date  . Tonsillectomy and adenoidectomy      one tonsil  . Pericardectomy  07/15/2010    Dr. [name]  . Pleurx catheter placement  07/15/2010    Dr. [name]  . Shoulder surgery      right and left shoulders  . Knee surgery      right  . Colonoscopy      SH: Social History  Substance Use Topics  . Smoking status: Current Every Day Smoker -- 0.25 packs/day for 20 years    Types: Cigarettes  . Smokeless tobacco: Never Used  . Alcohol Use: 0.0 oz/week     Comment: several times a week and mostly on the weekends    MEDS: Prior to Admission medications   Medication Sig Start Date End Date Taking? Authorizing Provider  amLODipine (NORVASC) 10 MG tablet Take 1 tablet (10 mg total) by  mouth daily. 06/19/15  Yes Golden Circle, FNP  cetirizine (ZYRTEC) 5 MG tablet Take 5 mg by mouth daily.   Yes Historical Provider, MD  citalopram (CELEXA) 40 MG tablet TAKE 1 TABLET (40 MG TOTAL) BY MOUTH DAILY. 07/31/15  Yes Biagio Borg, MD  fenofibrate (TRICOR) 145 MG tablet TAKE ONE TABLET BY MOUTH DAILY. 06/05/15  Yes Biagio Borg, MD  furosemide (LASIX) 40 MG tablet Take 1 tablet (40 mg total) by mouth daily. 08/20/15  Yes Golden Circle, FNP  gabapentin (NEURONTIN) 100 MG capsule Take 1 capsule (100 mg total) by mouth daily. Patient taking differently: Take 300 mg by mouth 3 (three) times daily.  01/21/15  Yes Binnie Rail, MD  lisinopril (PRINIVIL,ZESTRIL) 20 MG tablet Take 1 tablet (20 mg total) by mouth 2 (two) times daily. Overdue for yearly physical w/labs must see Marya Amsler for refills 08/10/15  Yes Golden Circle, FNP  metFORMIN (GLUCOPHAGE) 500 MG tablet TAKE 1 TABLET (500 MG TOTAL) BY MOUTH 2 (TWO) TIMES DAILY WITH A MEAL. 04/06/15  Yes Golden Circle, FNP  oxyCODONE-acetaminophen (PERCOCET) 10-325 MG tablet TK 1 T PO Q 6 H PRN P 01/30/15  Yes Historical Provider, MD  potassium chloride 20 MEQ  TBCR Take 20 mEq by mouth daily. 08/20/15  Yes Golden Circle, FNP  sildenafil (REVATIO) 20 MG tablet Take 1-5 tablets (20-100 mg total) by mouth daily as needed. 08/20/15  Yes Golden Circle, FNP  traMADol (ULTRAM) 50 MG tablet Take 1 tablet (50 mg total) by mouth every 8 (eight) hours as needed. 11/18/14  Yes Biagio Borg, MD  VOLTAREN 1 % GEL Apply 2 g topically 4 (four) times daily. 02/17/15  Yes Golden Circle, FNP  benzonatate (TESSALON) 100 MG capsule TAKE 1 CAPSULE (100 MG TOTAL) BY MOUTH 2 (TWO) TIMES DAILY AS NEEDED FOR COUGH. 04/08/15   Golden Circle, FNP  Blood Glucose Monitoring Suppl (ACCU-CHEK NANO SMARTVIEW) W/DEVICE KIT Use glucose meter to check blood sugar 1-3 times daily as directed. 03/10/15   Golden Circle, FNP  clonazePAM (KLONOPIN) 0.5 MG tablet TAKE 1 TABLET BY  MOUTH TWICE DAILY AS NEEDED FOR ANXIETY 12/20/13   Biagio Borg, MD  glucose blood (ACCU-CHEK SMARTVIEW) test strip Use as instructed 03/10/15   Golden Circle, FNP  methocarbamol (ROBAXIN) 750 MG tablet Take 1 tablet (750 mg total) by mouth 3 (three) times daily as needed for muscle spasms. 02/17/15   Golden Circle, FNP  rosuvastatin (CRESTOR) 20 MG tablet TAKE 1 TABLET (20 MG TOTAL) BY MOUTH DAILY. 06/05/15   Biagio Borg, MD    ALLERGY: Allergies  Allergen Reactions  . Diphenhydramine Itching, Palpitations and Other (See Comments)    "jittery"  . Other Itching and Swelling    Pecans: itching and swelling of the tongue   . Colchicine Diarrhea  . Lipitor [Atorvastatin] Other (See Comments)    Muscle cramps  . Septra [Sulfamethoxazole-Trimethoprim] Itching    ROS: ROS  NEUROLOGIC EXAM: Awake, alert, oriented Memory and concentration grossly intact Speech fluent, appropriate CN grossly intact Motor exam: Upper Extremities Deltoid Bicep Tricep Grip  Right 5/5 5/5 5/5 5/5  Left 5/5 5/5 5/5 5/5   Lower Extremity IP Quad PF DF EHL  Right 5/5 5/5 5/5 5/5 5/5  Left 5/5 5/5 5/5 5/5 5/5   Sensation grossly intact to LT  IMAGING: No new imaging  IMPRESSION: - 50 y.o. male with lumbar epidural lipomatosis and neurogenic claudication.  PLAN: - L2-S1 decompression

## 2015-08-26 NOTE — Anesthesia Procedure Notes (Signed)
Procedure Name: Intubation Date/Time: 08/26/2015 2:09 PM Performed by: Eligha Bridegroom Pre-anesthesia Checklist: Patient identified, Timeout performed, Emergency Drugs available, Suction available and Patient being monitored Patient Re-evaluated:Patient Re-evaluated prior to inductionOxygen Delivery Method: Circle system utilized Preoxygenation: Pre-oxygenation with 100% oxygen Intubation Type: IV induction and Cricoid Pressure applied Ventilation: Mask ventilation without difficulty Laryngoscope Size: Mac and 4 Tube type: Oral Tube size: 7.5 mm Number of attempts: 1 Airway Equipment and Method: Stylet Placement Confirmation: ETT inserted through vocal cords under direct vision,  breath sounds checked- equal and bilateral and positive ETCO2 Secured at: 23 cm Tube secured with: Tape Dental Injury: Teeth and Oropharynx as per pre-operative assessment

## 2015-08-26 NOTE — Anesthesia Preprocedure Evaluation (Signed)
Anesthesia Evaluation  Patient identified by MRN, date of birth, ID band Patient awake    Reviewed: Allergy & Precautions, NPO status , Patient's Chart, lab work & pertinent test results  Airway Mallampati: II   Neck ROM: full    Dental   Pulmonary sleep apnea , Current Smoker,    breath sounds clear to auscultation       Cardiovascular hypertension,  Rhythm:regular Rate:Normal     Neuro/Psych  Headaches, Anxiety Depression  Neuromuscular disease    GI/Hepatic GERD  ,  Endo/Other  diabetes, Type 2obese  Renal/GU      Musculoskeletal  (+) Arthritis ,   Abdominal   Peds  Hematology   Anesthesia Other Findings   Reproductive/Obstetrics                             Anesthesia Physical Anesthesia Plan  ASA: III  Anesthesia Plan: General   Post-op Pain Management:    Induction: Intravenous  Airway Management Planned: Oral ETT  Additional Equipment:   Intra-op Plan:   Post-operative Plan: Extubation in OR  Informed Consent: I have reviewed the patients History and Physical, chart, labs and discussed the procedure including the risks, benefits and alternatives for the proposed anesthesia with the patient or authorized representative who has indicated his/her understanding and acceptance.     Plan Discussed with: CRNA, Anesthesiologist and Surgeon  Anesthesia Plan Comments:         Anesthesia Quick Evaluation

## 2015-08-27 ENCOUNTER — Encounter (HOSPITAL_COMMUNITY): Payer: Self-pay | Admitting: Neurological Surgery

## 2015-08-27 ENCOUNTER — Telehealth: Payer: Self-pay | Admitting: Pulmonary Disease

## 2015-08-27 LAB — GLUCOSE, CAPILLARY: Glucose-Capillary: 125 mg/dL — ABNORMAL HIGH (ref 65–99)

## 2015-08-27 MED ORDER — OXYCODONE-ACETAMINOPHEN 10-325 MG PO TABS: 1.0000 | ORAL_TABLET | Freq: Four times a day (QID) | ORAL | Status: DC | PRN

## 2015-08-27 MED ORDER — METHOCARBAMOL 750 MG PO TABS: 750.0000 mg | ORAL_TABLET | Freq: Four times a day (QID) | ORAL | Status: DC | PRN

## 2015-08-27 MED ORDER — OXYCODONE HCL 5 MG PO TABS: 5.0000 mg | ORAL_TABLET | ORAL | Status: DC | PRN

## 2015-08-27 MED ORDER — GABAPENTIN 100 MG PO CAPS: 300.0000 mg | ORAL_CAPSULE | Freq: Three times a day (TID) | ORAL | Status: DC

## 2015-08-27 NOTE — Anesthesia Postprocedure Evaluation (Signed)
Anesthesia Post Note  Patient: Harold [name]  Procedure(s) Performed: Procedure(s) (LRB): Lumbar Two-Sacral One  Laminectomy for decompression (N/A)  Patient location during evaluation: PACU Anesthesia Type: General Level of consciousness: awake and alert Pain management: pain level controlled Vital Signs Assessment: post-procedure vital signs reviewed and stable Respiratory status: spontaneous breathing, nonlabored ventilation, respiratory function stable and patient connected to nasal cannula oxygen Cardiovascular status: blood pressure returned to baseline and stable Postop Assessment: no signs of nausea or vomiting Anesthetic complications: no    Last Vitals:  Filed Vitals:   08/26/15 2341 08/27/15 0359  BP: 123/77 125/81  Pulse: 68 65  Temp: 36.6 C 36.7 C  Resp: 20 20    Last Pain:  Filed Vitals:   08/27/15 0634  PainSc: 4                  Catalina Gravel

## 2015-08-27 NOTE — Evaluation (Signed)
Physical Therapy Evaluation and Discharge Patient Details Name: Harold [name] MRN: [id number] DOB: 13-Dec-1965 Today's Date: 08/27/2015   History of Present Illness  Pt is a 50 y.o. male s/p L2-S1 laminectomy for decompression. PMHx: Obesity, HTN, Depression, GERD, Hypertriglyceridemia, Gout, OSA, Anxiety, Acute pericarditis, DM, Bil shoulder sx, R knee sx.     Clinical Impression  Patient evaluated by Physical Therapy with no further acute PT needs identified. All education has been completed and the patient has no further questions. At the time of PT eval pt was able to perform transfers and ambulation with gross supervision for safety. No unsteadiness or LOB was noted during session. See below for any follow-up Physical Therapy or equipment needs. PT is signing off. Thank you for this referral.    Follow Up Recommendations Outpatient PT;Supervision for mobility/OOB    Equipment Recommendations  None recommended by PT    Recommendations for Other Services       Precautions / Restrictions Precautions Precautions: Back;Fall Precaution Booklet Issued: Yes (comment) Precaution Comments: Pt able to recall 3/3 back precautions and maintain throughout session. Required Braces or Orthoses:  (No brace) Restrictions Weight Bearing Restrictions: No      Mobility  Bed Mobility               General bed mobility comments: Pt received standing in hall with OT.  Transfers Overall transfer level: Needs assistance Equipment used: None Transfers: Sit to/from Stand Sit to Stand: Supervision         General transfer comment: Gross supervision for safety for stand>sit. Pt with good control during descent.   Ambulation/Gait Ambulation/Gait assistance: Supervision Ambulation Distance (Feet): 400 Feet Assistive device: None Gait Pattern/deviations: Step-through pattern;Decreased stride length Gait velocity: Decreased Gait velocity interpretation: Below normal speed for  age/gender General Gait Details: Slow but steady gait. Overall supervision provided for safety but no unsteadiness noted.   Stairs            Wheelchair Mobility    Modified Rankin (Stroke Patients Only)       Balance Overall balance assessment: Needs assistance Sitting-balance support: Feet supported;No upper extremity supported Sitting balance-Leahy Scale: Good     Standing balance support: No upper extremity supported;During functional activity Standing balance-Leahy Scale: Good                               Pertinent Vitals/Pain Pain Assessment: Faces Faces Pain Scale: Hurts even more Pain Location: Incision site Pain Descriptors / Indicators: Operative site guarding;Sore Pain Intervention(s): Limited activity within patient's tolerance;Monitored during session;Repositioned    Home Living Family/patient expects to be discharged to:: Private residence Living Arrangements: Spouse/significant other;Other relatives (fiance and nephew (24 y.o.)) Available Help at Discharge: Family;Available 24 hours/day Type of Home: House Home Access: Stairs to enter Entrance Stairs-Rails: Chemical engineer of Steps: 3 Home Layout: Two level;Able to live on main level with bedroom/bathroom Home Equipment: None      Prior Function Level of Independence: Needs assistance   Gait / Transfers Assistance Needed: Not using any AD   ADL's / Homemaking Assistance Needed: Family assisted with dressing when needed PTA.        Hand Dominance        Extremity/Trunk Assessment   Upper Extremity Assessment: Defer to OT evaluation;Overall WFL for tasks assessed           Lower Extremity Assessment: RLE deficits/detail RLE Deficits / Details: Pt reports residual weakness  in RLE due to several knee surgeries.     Cervical / Trunk Assessment: Other exceptions  Communication   Communication: No difficulties  Cognition Arousal/Alertness:  Awake/alert Behavior During Therapy: Flat affect Overall Cognitive Status: Within Functional Limits for tasks assessed                      General Comments      Exercises        Assessment/Plan    PT Assessment Patent does not need any further PT services  PT Diagnosis Difficulty walking;Acute pain   PT Problem List    PT Treatment Interventions     PT Goals (Current goals can be found in the Care Plan section) Acute Rehab PT Goals Patient Stated Goal: decrease pain PT Goal Formulation: All assessment and education complete, DC therapy    Frequency     Barriers to discharge        Co-evaluation               End of Session Equipment Utilized During Treatment: Gait belt Activity Tolerance: Patient tolerated treatment well Patient left: in chair;with call bell/phone within reach Nurse Communication: Mobility status         Time: 0812-0823 PT Time Calculation (min) (ACUTE ONLY): 11 min   Charges:   PT Evaluation $PT Eval Moderate Complexity: 1 Procedure     PT G Codes:        Rolinda Roan 09/22/15, 1:40 PM  Rolinda Roan, PT, DPT Acute Rehabilitation Services Pager: [phone]

## 2015-08-27 NOTE — Discharge Summary (Signed)
Date of Admission: 08/26/2015  Date of Discharge: 08/27/15  Admission Diagnosis: Lumbar stenosis L2-S1, lumbar epidural lipomatosis  Discharge Diagnosis: Same   Procedure Performed: L2-S1 laminectomy for decompression  Attending: Tamala Fothergill, MD  Hospital Course:  The patient was admitted for the above listed operation. He tolerated this well. He is discharged on postoperative day 1 and improved condition.  Follow up: 3 weeks    Medication List    TAKE these medications        ACCU-CHEK NANO SMARTVIEW w/Device Kit  Use glucose meter to check blood sugar 1-3 times daily as directed.     amLODipine 10 MG tablet  Commonly known as:  NORVASC  Take 1 tablet (10 mg total) by mouth daily.     benzonatate 100 MG capsule  Commonly known as:  TESSALON  TAKE 1 CAPSULE (100 MG TOTAL) BY MOUTH 2 (TWO) TIMES DAILY AS NEEDED FOR COUGH.     cetirizine 5 MG tablet  Commonly known as:  ZYRTEC  Take 5 mg by mouth daily.     citalopram 40 MG tablet  Commonly known as:  CELEXA  TAKE 1 TABLET (40 MG TOTAL) BY MOUTH DAILY.     clonazePAM 0.5 MG tablet  Commonly known as:  KLONOPIN  TAKE 1 TABLET BY MOUTH TWICE DAILY AS NEEDED FOR ANXIETY     fenofibrate 145 MG tablet  Commonly known as:  TRICOR  TAKE ONE TABLET BY MOUTH DAILY.     furosemide 40 MG tablet  Commonly known as:  LASIX  Take 1 tablet (40 mg total) by mouth daily.     gabapentin 100 MG capsule  Commonly known as:  NEURONTIN  Take 3 capsules (300 mg total) by mouth 3 (three) times daily.     glucose blood test strip  Commonly known as:  ACCU-CHEK SMARTVIEW  Use as instructed     lisinopril 20 MG tablet  Commonly known as:  PRINIVIL,ZESTRIL  Take 1 tablet (20 mg total) by mouth 2 (two) times daily. Overdue for yearly physical w/labs must see Marya Amsler for refills     metFORMIN 500 MG tablet  Commonly known as:  GLUCOPHAGE  TAKE 1 TABLET (500 MG TOTAL) BY MOUTH 2 (TWO) TIMES DAILY WITH A MEAL.     methocarbamol  750 MG tablet  Commonly known as:  ROBAXIN  Take 1 tablet (750 mg total) by mouth every 6 (six) hours as needed for muscle spasms.     oxyCODONE 5 MG immediate release tablet  Commonly known as:  Oxy IR/ROXICODONE  Take 1-2 tablets (5-10 mg total) by mouth every 3 (three) hours as needed for breakthrough pain.     oxyCODONE-acetaminophen 10-325 MG tablet  Commonly known as:  PERCOCET  Take 1 tablet by mouth every 6 (six) hours as needed for pain.     Potassium Chloride ER 20 MEQ Tbcr  Take 20 mEq by mouth daily.     rosuvastatin 20 MG tablet  Commonly known as:  CRESTOR  TAKE 1 TABLET (20 MG TOTAL) BY MOUTH DAILY.     sildenafil 20 MG tablet  Commonly known as:  REVATIO  Take 1-5 tablets (20-100 mg total) by mouth daily as needed.     traMADol 50 MG tablet  Commonly known as:  ULTRAM  Take 1 tablet (50 mg total) by mouth every 8 (eight) hours as needed.     VOLTAREN 1 % Gel  Generic drug:  diclofenac sodium  Apply 2 g topically 4 (  four) times daily.

## 2015-08-27 NOTE — Telephone Encounter (Signed)
Spoke with pt and he states that while in the hospital after back surgery his O2 levels dropped and he was placed on O2 at night. He was advised to see pulmonary to evaluate nocturnal hypoxemia. Pt has been on CPAP in the past but has not used in several years. Pt's main concern is his O2 level. Appt made with MW for first available on 6/21. Explained to pt that we need to evaluate his O2 problems and then possible have him follow up with sleep doc if MW deems necessary. Pt also advised to contact PCP for any urgent O2 needs. Also told pt he can call to check for cancellations if he would like to be seen sooner. Pt voiced understanding. Nothing further needed.

## 2015-08-27 NOTE — Progress Notes (Signed)
Pt doing well. Pt and wife given D/C instructions with Rx's, verbal understanding was provided. Pt's incision is covered with Honeycomb dressing and has minimal amount of drainage. Pt's IV and Hemovac were removed prior to D/C. Pt D/C'd home via wheelchair @ 1045 per MD order. Pt is stable @ D/C and has no other needs at this time. Holli Humbles, RN

## 2015-08-27 NOTE — Evaluation (Signed)
Occupational Therapy Evaluation and Discharge Patient Details Name: Harold [name] MRN: [id number] DOB: 1965-05-16 Today's Date: 08/27/2015    History of Present Illness Pt is a 50 y.o. male s/p L2-S1 laminectomy for decompression. PMHx: Obesity, HTN, Depression, GERD, Hypertriglyceridemia, Gout, OSA, Anxiety, Acute pericarditis, DM, Bil shoulder sx, R knee sx.      Clinical Impression   Pt reports he required min assist with ADLs PTA; nephew was able to assist as needed. Currently pt is overall min guard for safety with functional mobility and ADLs with the exception of min assist for LB ADLs. All back, safety, and ADL education completed with pt. Pt able to verbally recall 3/3 back precautions at start of session and maintain throughout all activities. Pt planning to d/c home with 24/7 supervision from family. No further acute OT needs identified; signing off at this time. Please re-consult if needs change. Thank you for this referral.     Follow Up Recommendations  No OT follow up;Supervision - Intermittent    Equipment Recommendations  3 in 1 bedside comode    Recommendations for Other Services PT consult     Precautions / Restrictions Precautions Precautions: Back;Fall Precaution Booklet Issued: Yes (comment) Precaution Comments: Pt able to recall 3/3 back precautions and maintain throughout session. Required Braces or Orthoses:  (No brace) Restrictions Weight Bearing Restrictions: No      Mobility Bed Mobility Overal bed mobility: Needs Assistance Bed Mobility: Rolling;Sidelying to Sit Rolling: Supervision Sidelying to sit: Min guard       General bed mobility comments: No physical assist required. HOB flat with use of bed rail.  Transfers Overall transfer level: Needs assistance Equipment used: None Transfers: Sit to/from Stand Sit to Stand: Min guard         General transfer comment: Min guard for safety; no physical assist required. Increased time needed  with 2 attempts to perform sit to stand secondary to "problems with R knee".    Balance Overall balance assessment: Needs assistance Sitting-balance support: No upper extremity supported;Feet supported Sitting balance-Leahy Scale: Good     Standing balance support: No upper extremity supported;During functional activity Standing balance-Leahy Scale: Good                              ADL Overall ADL's : Needs assistance/impaired Eating/Feeding: Independent;Sitting   Grooming: Min guard;Standing Grooming Details (indicate cue type and reason): Educated on use of 2 cups for oral care Upper Body Bathing: Set up;Supervision/ safety;Sitting   Lower Body Bathing: Minimal assistance;Sit to/from stand   Upper Body Dressing : Set up;Supervision/safety;Sitting   Lower Body Dressing: Minimal assistance;Sit to/from stand Lower Body Dressing Details (indicate cue type and reason): Pt able to demo donning L sock with correct technique to maintain back precautions. Toilet Transfer: Min guard;Ambulation;BSC Toilet Transfer Details (indicate cue type and reason): Simulated by sit to stand from EOB. Educated on use of 3 in 1 over toilet. Toileting- Clothing Manipulation and Hygiene: Minimal assistance;Sit to/from stand   Tub/ Shower Transfer: Min guard;Walk-in shower;Ambulation;3 in 1 Tub/Shower Transfer Details (indicate cue type and reason): Educated on use of 3 in 1 in shower as a seat. Functional mobility during ADLs: Min guard General ADL Comments: Educated pt on maintaing back precautions during functional activities, log roll technique for bed mobility, sitting in shower while bathing for safety.     Vision Vision Assessment?: No apparent visual deficits   Perception  Praxis      Pertinent Vitals/Pain Pain Assessment: 0-10 Pain Score: 8  Pain Location: back Pain Descriptors / Indicators: Aching;Grimacing Pain Intervention(s): Monitored during session;Premedicated  before session;Repositioned     Hand Dominance     Extremity/Trunk Assessment Upper Extremity Assessment Upper Extremity Assessment: Overall WFL for tasks assessed   Lower Extremity Assessment Lower Extremity Assessment: Defer to PT evaluation   Cervical / Trunk Assessment Cervical / Trunk Assessment: Other exceptions Cervical / Trunk Exceptions: s/p lumbar sx   Communication Communication Communication: No difficulties   Cognition Arousal/Alertness: Awake/alert Behavior During Therapy: Flat affect Overall Cognitive Status: Within Functional Limits for tasks assessed                     General Comments       Exercises       Shoulder Instructions      Home Living Family/patient expects to be discharged to:: Private residence Living Arrangements: Spouse/significant other;Other relatives (fiance and nephew (50 y.o.)) Available Help at Discharge: Family;Available 24 hours/day Type of Home: House Home Access: Stairs to enter [employer] of Steps: 3 Entrance Stairs-Rails: Left;Right Home Layout: Two level;Able to live on main level with bedroom/bathroom Alternate Level Stairs-Number of Steps: flight   Bathroom Shower/Tub: Occupational psychologist: Standard     Home Equipment: None          Prior Functioning/Environment Level of Independence: Needs assistance    ADL's / Homemaking Assistance Needed: Family assisted with dressing when needed PTA.        OT Diagnosis: Generalized weakness;Acute pain   OT Problem List:     OT Treatment/Interventions:      OT Goals(Current goals can be found in the care plan section) Acute Rehab OT Goals Patient Stated Goal: decrease pain OT Goal Formulation: All assessment and education complete, DC therapy  OT Frequency:     Barriers to D/C:            Co-evaluation              End of Session Equipment Utilized During Treatment: Gait belt Nurse Communication: Other (comment) (pt  needs 3 in 1 for home)  Activity Tolerance: Patient tolerated treatment well Patient left: Other (comment) (in hallway with PT)   Time: [id number] OT Time Calculation (min): 12 min Charges:  OT General Charges $OT Visit: 1 Procedure OT Evaluation $OT Eval Moderate Complexity: 1 Procedure G-Codes:     Binnie Kand M.S., OTR/L Pager: [phone]  08/27/2015, 8:27 AM

## 2015-08-27 NOTE — Progress Notes (Signed)
Doing well Legs better Full strength Incision looks good D/c home

## 2015-08-30 ENCOUNTER — Other Ambulatory Visit: Payer: Self-pay | Admitting: Family

## 2015-09-01 ENCOUNTER — Telehealth: Payer: Self-pay | Admitting: Family

## 2015-09-01 NOTE — Telephone Encounter (Signed)
Patient called again about this. Please follow up

## 2015-09-01 NOTE — Telephone Encounter (Signed)
Please advise 

## 2015-09-01 NOTE — Telephone Encounter (Signed)
Pt called in and needs  And needs to know if Harold [name] can send an order to get a cpap machine    Does he need an appt?

## 2015-09-01 NOTE — Telephone Encounter (Signed)
He will likely need a sleep study performed as his last sleep study was from 2012. If I were to order a CPAP I would need assistance from pulmonary.

## 2015-09-02 NOTE — Telephone Encounter (Signed)
He says he has a sleep study on 6/21. Wants to know about an order for his oxygen. Can he get a script for that?

## 2015-09-02 NOTE — Telephone Encounter (Signed)
Does he currently have oxygen that he is using continuously? The last in the chart was 2L continuous from [place].

## 2015-09-04 NOTE — Telephone Encounter (Signed)
Looking back at the last note made by pulmonology pt is currently on O2 at night and was advised to follow up with PCP for O2 needs until he can be seen by them on 6/21

## 2015-09-06 NOTE — Telephone Encounter (Signed)
Please contact [place] to determine what is needed to be written on the prescription.

## 2015-09-13 DIAGNOSIS — M545 Low back pain: Secondary | ICD-10-CM | POA: Diagnosis not present

## 2015-09-13 DIAGNOSIS — M549 Dorsalgia, unspecified: Secondary | ICD-10-CM | POA: Diagnosis not present

## 2015-09-13 DIAGNOSIS — F17200 Nicotine dependence, unspecified, uncomplicated: Secondary | ICD-10-CM | POA: Diagnosis not present

## 2015-09-13 DIAGNOSIS — M62830 Muscle spasm of back: Secondary | ICD-10-CM | POA: Diagnosis not present

## 2015-09-13 DIAGNOSIS — I10 Essential (primary) hypertension: Secondary | ICD-10-CM | POA: Diagnosis not present

## 2015-09-16 ENCOUNTER — Institutional Professional Consult (permissible substitution): Payer: Commercial Managed Care - HMO | Admitting: Internal Medicine

## 2015-09-21 ENCOUNTER — Other Ambulatory Visit: Payer: Self-pay | Admitting: Family

## 2015-10-18 ENCOUNTER — Encounter: Payer: Self-pay | Admitting: Cardiology

## 2015-10-18 NOTE — Progress Notes (Signed)
Cardiology Office Note   Date:  10/19/2015   ID:  Harold [name], DOB [birth year], MRN [id number]  PCP:  Mauricio Po, FNP  Cardiologist:   Minus Breeding, MD   No chief complaint on file.     History of Present Illness: Harold [name] is a 50 y.o. male who presents for evaluation of multiple cardiovascular risk factors. I saw him in the past for pericarditis.    I sent him for a POET (Plain Old Exercise Treadmill) after the last visit.  He had no ischemia but did have a hypertensive response.    Since I last saw him he had back surgery.  He is having some back pain but leg numbness is improved.  The patient denies any new symptoms such as chest discomfort, neck or arm discomfort. There has been no new shortness of breath, PND or orthopnea. There have been no reported palpitations, presyncope or syncope.    Past Medical History:  Diagnosis Date  . Acute pericarditis    admx 03-26-10 thru 03-29-10; a. echo 03-26-10: EF 55-60%; mild LVH; trivial MR; RVF ok; mild -mod circumferential Eff w/o tamponade  . Allergic rhinitis, cause unspecified 09/14/2012  . Anxiety   . Chronic back pain   . DDD (degenerative disc disease), lumbar 09/14/2012  . Depression   . Diabetes mellitus without complication ([place])   . Erectile dysfunction 09/14/2012  . GERD (gastroesophageal reflux disease)   . Gout    pt denies  . Headache    migraines in the past  . History of pancreatitis    a. admx 04-2009.Marland KitchenMarland Kitchen? 2-2 triglycerides  . Hyperlipidemia 07/11/2013  . Hypertension   . Hypertriglyceridemia    a. followed by [facility]  . Left sided sciatica 09/14/2012  . Metabolic syndrome   . Obesity   . OSA (obstructive sleep apnea)    does not use cpap  . Restless legs     Past Surgical History:  Procedure Laterality Date  . COLONOSCOPY    . KNEE SURGERY     right  . LUMBAR LAMINECTOMY/DECOMPRESSION MICRODISCECTOMY N/A 08/26/2015   Procedure: Lumbar Two-Sacral One  Laminectomy for decompression;   Surgeon: [name], MD;  Location: [facility];  Service: Neurosurgery;  Laterality: N/A;  . pericardectomy  07/15/2010   Dr. [name]  . pleurx catheter placement  07/15/2010   Dr. [name]  . SHOULDER SURGERY     right and left shoulders  . TONSILLECTOMY AND ADENOIDECTOMY     one tonsil     Current Outpatient Prescriptions  Medication Sig Dispense Refill  . amLODipine (NORVASC) 10 MG tablet Take 1 tablet (10 mg total) by mouth daily. 90 tablet 2  . Blood Glucose Monitoring Suppl (ACCU-CHEK NANO SMARTVIEW) W/DEVICE KIT Use glucose meter to check blood sugar 1-3 times daily as directed. 1 kit 0  . cetirizine (ZYRTEC) 5 MG tablet Take 5 mg by mouth daily.    . citalopram (CELEXA) 40 MG tablet TAKE 1 TABLET (40 MG TOTAL) BY MOUTH DAILY. 90 tablet 2  . fenofibrate (TRICOR) 145 MG tablet TAKE ONE TABLET BY MOUTH DAILY. 90 tablet 1  . fentaNYL (DURAGESIC - DOSED MCG/HR) 75 MCG/HR Place 75 mcg onto the skin every 3 (three) days.    . furosemide (LASIX) 40 MG tablet Take 1 tablet (40 mg total) by mouth daily. 90 tablet 1  . gabapentin (NEURONTIN) 100 MG capsule Take 3 capsules (300 mg total) by mouth 3 (three) times daily. [place]  capsule 3  . glucose blood (ACCU-CHEK SMARTVIEW) test strip Use as instructed 100 each 12  . ibuprofen (ADVIL,MOTRIN) 800 MG tablet Take 800 mg by mouth every 8 (eight) hours as needed.    [name] lisinopril (PRINIVIL,ZESTRIL) 20 MG tablet TAKE ONE TABLET BY MOUTH TWICE A DAY 60 tablet 5  . metFORMIN (GLUCOPHAGE) 500 MG tablet TAKE 1 TABLET BY MOUTH TWICE DAILY WITH A MEAL 60 tablet 1  . oxyCODONE (OXY IR/ROXICODONE) 5 MG immediate release tablet Take 1-2 tablets (5-10 mg total) by mouth every 3 (three) hours as needed for breakthrough pain. 90 tablet 0  . oxyCODONE-acetaminophen (PERCOCET) 10-325 MG tablet Take 1 tablet by mouth every 6 (six) hours as needed for pain. 90 tablet 0  . potassium chloride 20 MEQ TBCR Take 20 mEq by mouth daily. 90 tablet 1  . rosuvastatin  (CRESTOR) 20 MG tablet TAKE 1 TABLET (20 MG TOTAL) BY MOUTH DAILY. 90 tablet 2  . sildenafil (REVATIO) 20 MG tablet Take 1-5 tablets (20-100 mg total) by mouth daily as needed. 50 tablet 0  . VOLTAREN 1 % GEL Apply 2 g topically 4 (four) times daily. 100 g 5   No current facility-administered medications for this visit.     Allergies:   Diphenhydramine; Other; Colchicine; Lipitor [atorvastatin]; and Septra [sulfamethoxazole-trimethoprim]    ROS:  Please see the history of present illness.   Otherwise, review of systems are positive for none.   All other systems are reviewed and negative.    PHYSICAL EXAM: VS:  BP 130/78 (BP Location: Left Arm, Patient Position: Sitting, Cuff Size: Large)   Pulse 74   Ht '5\' 11"'  (1.803 m)   Wt 266 lb (120.7 kg)   SpO2 98%   BMI 37.10 kg/m  , BMI Body mass index is 37.1 kg/m. GENERAL:  Well appearing NECK:  No jugular venous distention, waveform within normal limits, carotid upstroke brisk and symmetric, no bruits, no thyromegaly LUNGS:  Clear to auscultation bilaterally BACK:  No CVA tenderness CHEST:  Unremarkable HEART:  PMI not displaced or sustained,S1 and S2 within normal limits, no S3, no S4, no clicks, no rubs, no murmurs ABD:  Flat, positive bowel sounds normal in frequency in pitch, no bruits, no rebound, no guarding, no midline pulsatile mass, no hepatomegaly, no splenomegaly EXT:  2 plus pulses throughout, no edema, no cyanosis no clubbing   EKG:  EKG is not ordered today.    Recent Labs: 08/25/2015: BUN <5; Creatinine, Ser 0.92; Hemoglobin 15.0; Platelets 381; Potassium 3.8; Sodium 135    Lipid Panel    Component Value Date/Time   CHOL 146 11/19/2013 1206   TRIG 145.0 11/19/2013 1206   HDL 37.80 (L) 11/19/2013 1206   CHOLHDL 4 11/19/2013 1206   VLDL 29.0 11/19/2013 1206   LDLCALC 79 11/19/2013 1206   LDLDIRECT 124.5 09/14/2012 1044      Wt Readings from Last 3 Encounters:  10/19/15 266 lb (120.7 kg)  08/26/15 264 lb  (119.7 kg)  08/25/15 264 lb 1.8 oz (119.8 kg)      Other studies Reviewed: Additional studies/ records that were reviewed today include: Labs. Review of the above records demonstrates:  Please see elsewhere in the note.     ASSESSMENT AND PLAN:  SOB:   This is improved.  I don't suspect any cardiac etiology.  After he recovers from his back surgery hopefully he will be able to walk and his breathing should improve with increased activity.  OBESITY:  The patient  understands the need to lose weight with diet and exercise. We have discussed specific strategies for this.  DM:  His A1c was 7.5 recently. However, he's changed his diet and his activity and thinks it slowly coming down.  RISK REDUCTION:  He will stop smoking.  We talked about this again today.  HTN:  BP is good today.  He says that it is high sometimes at home.  However, he needs a big cuff.  He will follow this at home.  In addition he has a [facility] Clinic appt and hopefully will have better control once apnea is treated.        Current medicines are reviewed at length with the patient today.  The patient does not have concerns regarding medicines.  The following changes have been made:  no change  Labs/ tests ordered today include:   No orders of the defined types were placed in this encounter.    Disposition:   FU with me in six months.    Signed, Minus Breeding, MD  10/19/2015 12:25 PM    [facility]

## 2015-10-19 ENCOUNTER — Encounter: Payer: Self-pay | Admitting: Cardiology

## 2015-10-19 ENCOUNTER — Ambulatory Visit (INDEPENDENT_AMBULATORY_CARE_PROVIDER_SITE_OTHER): Payer: Commercial Managed Care - HMO | Admitting: Cardiology

## 2015-10-19 VITALS — BP 130/78 | HR 74 | Ht 71.0 in | Wt 266.0 lb

## 2015-10-19 DIAGNOSIS — R0602 Shortness of breath: Secondary | ICD-10-CM

## 2015-10-19 DIAGNOSIS — I10 Essential (primary) hypertension: Secondary | ICD-10-CM | POA: Diagnosis not present

## 2015-10-19 NOTE — Patient Instructions (Signed)
Your physician recommends that you schedule a follow-up appointment in: 6 Months  

## 2015-10-21 ENCOUNTER — Institutional Professional Consult (permissible substitution): Payer: Commercial Managed Care - HMO | Admitting: Pulmonary Disease

## 2015-10-26 ENCOUNTER — Telehealth: Payer: Self-pay | Admitting: Family

## 2015-10-26 NOTE — Telephone Encounter (Signed)
Called pt and stated that I was sorry for any miscommunication. Asked for him to wait until tomorrow when he is seen to get antibiotic so we can evaluate him sxs. Pt understood.

## 2015-10-26 NOTE — Telephone Encounter (Signed)
Pt stated he request refill for Acgmentin since last week but he never heard anything from our office. Pt has an appt with Marya Amsler for Sinus tomorrow, please advise.

## 2015-10-27 ENCOUNTER — Encounter: Payer: Self-pay | Admitting: Family

## 2015-10-27 ENCOUNTER — Ambulatory Visit (INDEPENDENT_AMBULATORY_CARE_PROVIDER_SITE_OTHER): Payer: Commercial Managed Care - HMO | Admitting: Family

## 2015-10-27 ENCOUNTER — Telehealth: Payer: Self-pay | Admitting: *Deleted

## 2015-10-27 ENCOUNTER — Encounter: Payer: Self-pay | Admitting: *Deleted

## 2015-10-27 VITALS — BP 134/86 | HR 59 | Temp 98.1°F | Resp 18 | Ht 71.0 in | Wt 267.0 lb

## 2015-10-27 DIAGNOSIS — J0140 Acute pansinusitis, unspecified: Secondary | ICD-10-CM | POA: Diagnosis not present

## 2015-10-27 MED ORDER — AMOXICILLIN-POT CLAVULANATE 875-125 MG PO TABS: 1.0000 | ORAL_TABLET | Freq: Two times a day (BID) | ORAL | 0 refills | Status: DC

## 2015-10-27 NOTE — Progress Notes (Signed)
Subjective:    Patient ID: Harold [name], male    DOB: 1965/05/14, 50 y.o.   MRN: [id number]  Chief Complaint  Patient presents with  . Nasal Congestion    chest congestion, productive cough x10 days, issues with back    HPI:  Harold [name] is a 50 y.o. male who  has a past medical history of Acute pericarditis; Allergic rhinitis, cause unspecified (09/14/2012); Anxiety; Chronic back pain; DDD (degenerative disc disease), lumbar (09/14/2012); Depression; Diabetes mellitus without complication ([place]); Erectile dysfunction (09/14/2012); GERD (gastroesophageal reflux disease); Gout; Headache; History of pancreatitis; Hyperlipidemia (07/11/2013); Hypertension; Hypertriglyceridemia; Left sided sciatica (09/14/2012); Metabolic syndrome; Obesity; OSA (obstructive sleep apnea); and Restless legs. and presents today for an acute office visit.   This is a new problem. Associated symptom of nasal congestion, chest congestion, and sinus pressure that has been going on for about 10 days. Fever approximately 1 week ago with a T-max of 100.1. Modifying factors include cough syrup, Tessalon, and over the counter sinus medication. Recent antibiotics include ciprofloxacin and metronidazole. Course of the symptoms have progressively worsened.   Allergies  Allergen Reactions  . Diphenhydramine Itching, Palpitations and Other (See Comments)    "jittery"  . Other Itching and Swelling    Pecans: itching and swelling of the tongue   . Colchicine Diarrhea  . Lipitor [Atorvastatin] Other (See Comments)    Muscle cramps  . Septra [Sulfamethoxazole-Trimethoprim] Itching     Current Outpatient Prescriptions on File Prior to Visit  Medication Sig Dispense Refill  . amLODipine (NORVASC) 10 MG tablet Take 1 tablet (10 mg total) by mouth daily. 90 tablet 2  . Blood Glucose Monitoring Suppl (ACCU-CHEK NANO SMARTVIEW) W/DEVICE KIT Use glucose meter to check blood sugar 1-3 times daily as directed. 1 kit 0  .  cetirizine (ZYRTEC) 5 MG tablet Take 5 mg by mouth daily.    . citalopram (CELEXA) 40 MG tablet TAKE 1 TABLET (40 MG TOTAL) BY MOUTH DAILY. 90 tablet 2  . fenofibrate (TRICOR) 145 MG tablet TAKE ONE TABLET BY MOUTH DAILY. 90 tablet 1  . fentaNYL (DURAGESIC - DOSED MCG/HR) 75 MCG/HR Place 50 mcg onto the skin every 3 (three) days.     . furosemide (LASIX) 40 MG tablet Take 1 tablet (40 mg total) by mouth daily. 90 tablet 1  . gabapentin (NEURONTIN) 100 MG capsule Take 3 capsules (300 mg total) by mouth 3 (three) times daily. 90 capsule 3  . glucose blood (ACCU-CHEK SMARTVIEW) test strip Use as instructed 100 each 12  . ibuprofen (ADVIL,MOTRIN) 800 MG tablet Take 800 mg by mouth every 8 (eight) hours as needed.    [name] lisinopril (PRINIVIL,ZESTRIL) 20 MG tablet TAKE ONE TABLET BY MOUTH TWICE A DAY 60 tablet 5  . metFORMIN (GLUCOPHAGE) 500 MG tablet TAKE 1 TABLET BY MOUTH TWICE DAILY WITH A MEAL 60 tablet 1  . oxyCODONE (OXY IR/ROXICODONE) 5 MG immediate release tablet Take 1-2 tablets (5-10 mg total) by mouth every 3 (three) hours as needed for breakthrough pain. 90 tablet 0  . oxyCODONE-acetaminophen (PERCOCET) 10-325 MG tablet Take 1 tablet by mouth every 6 (six) hours as needed for pain. 90 tablet 0  . rosuvastatin (CRESTOR) 20 MG tablet TAKE 1 TABLET (20 MG TOTAL) BY MOUTH DAILY. 90 tablet 2  . sildenafil (REVATIO) 20 MG tablet Take 1-5 tablets (20-100 mg total) by mouth daily as needed. 50 tablet 0  . VOLTAREN 1 % GEL Apply 2 g topically 4 (four) times daily.  100 g 5  . [DISCONTINUED] amoxicillin-clarithromycin-lansoprazole (PREVPAC) combo pack Take by mouth 2 (two) times daily. Follow package directions. 1 kit 0   No current facility-administered medications on file prior to visit.       Review of Systems  Constitutional: Negative for chills.  HENT: Positive for congestion, sinus pressure and sore throat. Negative for ear pain.   Respiratory: Positive for cough.   Neurological: Positive  for headaches.      Objective:    BP 134/86 (BP Location: Left Arm, Patient Position: Sitting, Cuff Size: Large)   Pulse (!) 59   Temp 98.1 F (36.7 C) (Oral)   Resp 18   Ht '5\' 11"'  (1.803 m)   Wt 267 lb (121.1 kg)   SpO2 97%   BMI 37.24 kg/m  Nursing note and vital signs reviewed.  Physical Exam  Constitutional: He is oriented to person, place, and time. He appears well-developed and well-nourished. No distress.  HENT:  Right Ear: Hearing, tympanic membrane, external ear and ear canal normal.  Left Ear: Hearing, tympanic membrane, external ear and ear canal normal.  Nose: Right sinus exhibits maxillary sinus tenderness and frontal sinus tenderness. Left sinus exhibits maxillary sinus tenderness and frontal sinus tenderness.  Mouth/Throat: Uvula is midline, oropharynx is clear and moist and mucous membranes are normal.  Neck: Neck supple.  Cardiovascular: Normal rate, regular rhythm, normal heart sounds and intact distal pulses.   Pulmonary/Chest: Effort normal and breath sounds normal.  Neurological: He is alert and oriented to person, place, and time.  Skin: Skin is warm and dry.  Psychiatric: He has a normal mood and affect. His behavior is normal. Judgment and thought content normal.       Assessment & Plan:   Problem List Items Addressed This Visit      Respiratory   Sinusitis, acute - Primary    Symptoms and exam consistent with bacterial sinusitis. Start Augmentin. Continue over-the-counter medications as needed for symptom relief and supportive care. Follow-up if symptoms worsen or fail to improve.      Relevant Medications   amoxicillin-clavulanate (AUGMENTIN) 875-125 MG tablet    Other Visit Diagnoses   None.      I have discontinued Mr. Delfavero Potassium Chloride ER and amoxicillin-clavulanate. I am also having him start on amoxicillin-clavulanate. Additionally, I am having him maintain his VOLTAREN, ACCU-CHEK NANO SMARTVIEW, glucose blood, fenofibrate,  rosuvastatin, amLODipine, citalopram, furosemide, sildenafil, cetirizine, gabapentin, oxyCODONE, oxyCODONE-acetaminophen, metFORMIN, lisinopril, ibuprofen, and fentaNYL.   Meds ordered this encounter  Medications  . amoxicillin-clavulanate (AUGMENTIN) 875-125 MG tablet    Sig: Take 1 tablet by mouth 2 (two) times daily.    Dispense:  20 tablet    Refill:  0    Order Specific Question:   Supervising Provider    Answer:   Pricilla Holm A [[id number]]     Follow-up: Return if symptoms worsen or fail to improve.  Mauricio Po, FNP

## 2015-10-27 NOTE — Patient Instructions (Addendum)
Thank you for choosing [facility].  Summary/Instructions:  Your prescription(s) have been submitted to your pharmacy or been printed and provided for you. Please take as directed and contact our office if you believe you are having problem(s) with the medication(s) or have any questions.  If your symptoms worsen or fail to improve, please contact our office for further instruction, or in case of emergency go directly to the emergency room at the closest medical facility.   General Recommendations:    Please drink plenty of fluids.  Get plenty of rest   Sleep in humidified air  Use saline nasal sprays  Netti pot   OTC Medications:  Decongestants - helps relieve congestion   Flonase (generic fluticasone) or Nasacort (generic triamcinolone) - please make sure to use the "cross-over" technique at a 45 degree angle towards the opposite eye as opposed to straight up the nasal passageway.   Sudafed (generic pseudoephedrine - Note this is the one that is available behind the pharmacy counter); Products with phenylephrine (-PE) may also be used but is often not as effective as pseudoephedrine.   If you have HIGH BLOOD PRESSURE - Coricidin HBP; AVOID any product that is -D as this contains pseudoephedrine which may increase your blood pressure.  Afrin (oxymetazoline) every 6-8 hours for up to 3 days.   Allergies - helps relieve runny nose, itchy eyes and sneezing   Claritin (generic loratidine), Allegra (fexofenidine), or Zyrtec (generic cyrterizine) for runny nose. These medications should not cause drowsiness.  Note - Benadryl (generic diphenhydramine) may be used however may cause drowsiness  Cough -   Delsym or Robitussin (generic dextromethorphan)  Expectorants - helps loosen mucus to ease removal   Mucinex (generic guaifenesin) as directed on the package.  Headaches / General Aches   Tylenol (generic acetaminophen) - DO NOT EXCEED 3 grams (3,000 mg) in a 24  hour time period  Advil/Motrin (generic ibuprofen)   Sore Throat -   Salt water gargle   Chloraseptic (generic benzocaine) spray or lozenges / Sucrets (generic dyclonine)    Sinusitis Sinusitis is redness, soreness, and inflammation of the paranasal sinuses. Paranasal sinuses are air pockets within the bones of your face (beneath the eyes, the middle of the forehead, or above the eyes). In healthy paranasal sinuses, mucus is able to drain out, and air is able to circulate through them by way of your nose. However, when your paranasal sinuses are inflamed, mucus and air can become trapped. This can allow bacteria and other germs to grow and cause infection. Sinusitis can develop quickly and last only a short time (acute) or continue over a long period (chronic). Sinusitis that lasts for more than 12 weeks is considered chronic.  CAUSES  Causes of sinusitis include:  Allergies.  Structural abnormalities, such as displacement of the cartilage that separates your nostrils (deviated septum), which can decrease the air flow through your nose and sinuses and affect sinus drainage.  Functional abnormalities, such as when the small hairs (cilia) that line your sinuses and help remove mucus do not work properly or are not present. SIGNS AND SYMPTOMS  Symptoms of acute and chronic sinusitis are the same. The primary symptoms are pain and pressure around the affected sinuses. Other symptoms include:  Upper toothache.  Earache.  Headache.  Bad breath.  Decreased sense of smell and taste.  A cough, which worsens when you are lying flat.  Fatigue.  Fever.  Thick drainage from your nose, which often is green and may   contain pus (purulent).  Swelling and warmth over the affected sinuses. DIAGNOSIS  Your health care provider will perform a physical exam. During the exam, your health care provider may:  Look in your nose for signs of abnormal growths in your nostrils (nasal  polyps).  Tap over the affected sinus to check for signs of infection.  View the inside of your sinuses (endoscopy) using an imaging device that has a light attached (endoscope). If your health care provider suspects that you have chronic sinusitis, one or more of the following tests may be recommended:  Allergy tests.  Nasal culture. A sample of mucus is taken from your nose, sent to a lab, and screened for bacteria.  Nasal cytology. A sample of mucus is taken from your nose and examined by your health care provider to determine if your sinusitis is related to an allergy. TREATMENT  Most cases of acute sinusitis are related to a viral infection and will resolve on their own within 10 days. Sometimes medicines are prescribed to help relieve symptoms (pain medicine, decongestants, nasal steroid sprays, or saline sprays).  However, for sinusitis related to a bacterial infection, your health care provider will prescribe antibiotic medicines. These are medicines that will help kill the bacteria causing the infection.  Rarely, sinusitis is caused by a fungal infection. In theses cases, your health care provider will prescribe antifungal medicine. For some cases of chronic sinusitis, surgery is needed. Generally, these are cases in which sinusitis recurs more than 3 times per year, despite other treatments. HOME CARE INSTRUCTIONS   Drink plenty of water. Water helps thin the mucus so your sinuses can drain more easily.  Use a humidifier.  Inhale steam 3 to 4 times a day (for example, sit in the bathroom with the shower running).  Apply a warm, moist washcloth to your face 3 to 4 times a day, or as directed by your health care provider.  Use saline nasal sprays to help moisten and clean your sinuses.  Take medicines only as directed by your health care provider.  If you were prescribed either an antibiotic or antifungal medicine, finish it all even if you start to feel better. SEEK IMMEDIATE  MEDICAL CARE IF:  You have increasing pain or severe headaches.  You have nausea, vomiting, or drowsiness.  You have swelling around your face.  You have vision problems.  You have a stiff neck.  You have difficulty breathing. MAKE SURE YOU:   Understand these instructions.  Will watch your condition.  Will get help right away if you are not doing well or get worse. Document Released: 03/14/2005 Document Revised: 07/29/2013 Document Reviewed: 03/29/2011 ExitCare Patient Information 2015 [employer]. This information is not intended to replace advice given to you by your health care provider. Make sure you discuss any questions you have with your health care provider.   

## 2015-10-27 NOTE — Assessment & Plan Note (Signed)
Symptoms and exam consistent with bacterial sinusitis. Start Augmentin. Continue over-the-counter medications as needed for symptom relief and supportive care. Follow up if symptoms worsen or fail to improve. 

## 2015-10-27 NOTE — Telephone Encounter (Signed)
Pharmacy left msg on triage stating pt is requesting a refill on Augmentin which was rx back in sept 2016. Faxed back denied request pt need appt for antibiotics...Johny Chess

## 2015-11-18 ENCOUNTER — Institutional Professional Consult (permissible substitution): Payer: Commercial Managed Care - HMO | Admitting: Pulmonary Disease

## 2015-12-03 DIAGNOSIS — M545 Low back pain: Secondary | ICD-10-CM | POA: Diagnosis not present

## 2016-01-06 DIAGNOSIS — M25561 Pain in right knee: Secondary | ICD-10-CM | POA: Diagnosis not present

## 2016-01-06 DIAGNOSIS — M545 Low back pain: Secondary | ICD-10-CM | POA: Diagnosis not present

## 2016-01-13 DIAGNOSIS — M25561 Pain in right knee: Secondary | ICD-10-CM | POA: Diagnosis not present

## 2016-01-13 DIAGNOSIS — M1711 Unilateral primary osteoarthritis, right knee: Secondary | ICD-10-CM | POA: Diagnosis not present

## 2016-02-16 DIAGNOSIS — M25561 Pain in right knee: Secondary | ICD-10-CM | POA: Diagnosis not present

## 2016-02-16 DIAGNOSIS — M545 Low back pain: Secondary | ICD-10-CM | POA: Diagnosis not present

## 2016-02-16 DIAGNOSIS — M1711 Unilateral primary osteoarthritis, right knee: Secondary | ICD-10-CM | POA: Diagnosis not present

## 2016-02-24 ENCOUNTER — Other Ambulatory Visit: Payer: Self-pay | Admitting: Family

## 2016-04-03 ENCOUNTER — Other Ambulatory Visit: Payer: Self-pay | Admitting: Family

## 2016-04-03 DIAGNOSIS — I10 Essential (primary) hypertension: Secondary | ICD-10-CM

## 2016-04-06 DIAGNOSIS — M545 Low back pain: Secondary | ICD-10-CM | POA: Diagnosis not present

## 2016-04-06 DIAGNOSIS — M25561 Pain in right knee: Secondary | ICD-10-CM | POA: Diagnosis not present

## 2016-04-11 DIAGNOSIS — M545 Low back pain: Secondary | ICD-10-CM | POA: Diagnosis not present

## 2016-04-20 DIAGNOSIS — M5126 Other intervertebral disc displacement, lumbar region: Secondary | ICD-10-CM | POA: Diagnosis not present

## 2016-04-20 DIAGNOSIS — M545 Low back pain: Secondary | ICD-10-CM | POA: Diagnosis not present

## 2016-05-07 ENCOUNTER — Other Ambulatory Visit: Payer: Self-pay | Admitting: Family

## 2016-05-12 ENCOUNTER — Ambulatory Visit: Payer: Commercial Managed Care - HMO | Admitting: Nurse Practitioner

## 2016-05-14 ENCOUNTER — Other Ambulatory Visit: Payer: Self-pay | Admitting: Internal Medicine

## 2016-05-19 ENCOUNTER — Other Ambulatory Visit: Payer: Self-pay | Admitting: Family

## 2016-05-22 ENCOUNTER — Telehealth: Payer: Commercial Managed Care - HMO | Admitting: Family

## 2016-05-22 DIAGNOSIS — J329 Chronic sinusitis, unspecified: Secondary | ICD-10-CM

## 2016-05-22 DIAGNOSIS — B9689 Other specified bacterial agents as the cause of diseases classified elsewhere: Secondary | ICD-10-CM

## 2016-05-22 MED ORDER — AMOXICILLIN-POT CLAVULANATE 875-125 MG PO TABS: 1.0000 | ORAL_TABLET | Freq: Two times a day (BID) | ORAL | 0 refills | Status: AC

## 2016-05-22 NOTE — Progress Notes (Signed)

## 2016-05-31 ENCOUNTER — Ambulatory Visit (INDEPENDENT_AMBULATORY_CARE_PROVIDER_SITE_OTHER): Payer: Medicare HMO | Admitting: Sports Medicine

## 2016-05-31 ENCOUNTER — Encounter: Payer: Self-pay | Admitting: Sports Medicine

## 2016-05-31 DIAGNOSIS — L853 Xerosis cutis: Secondary | ICD-10-CM

## 2016-05-31 DIAGNOSIS — B359 Dermatophytosis, unspecified: Secondary | ICD-10-CM | POA: Diagnosis not present

## 2016-05-31 DIAGNOSIS — E119 Type 2 diabetes mellitus without complications: Secondary | ICD-10-CM

## 2016-05-31 DIAGNOSIS — B351 Tinea unguium: Secondary | ICD-10-CM

## 2016-05-31 MED ORDER — TERBINAFINE HCL 1 % EX CREA: 1.0000 "application " | TOPICAL_CREAM | Freq: Two times a day (BID) | CUTANEOUS | 3 refills | Status: DC

## 2016-05-31 MED ORDER — AMMONIUM LACTATE 12 % EX LOTN: 1.0000 "application " | TOPICAL_LOTION | CUTANEOUS | 3 refills | Status: DC | PRN

## 2016-05-31 NOTE — Patient Instructions (Signed)

## 2016-05-31 NOTE — Progress Notes (Signed)
Subjective: KERI TAVELLA is a 51 y.o. male patient with history of diabetes who presents to office today complaining of long, painful nails  while ambulating in shoes and dry itchy skin to both feet. Patient states that the glucose reading this morning was not recorded but last a1c 6.1. Patient denies any new changes in medication or new problems. Patient denies any new cramping, numbness, burning or tingling in the legs. Reports that he does on average have a little numbness to tips of toes.   Patient Active Problem List   Diagnosis Date Noted  . 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  . Urinary frequency 07/24/2015  . Trichomonas exposure 07/24/2015  . Type 2 diabetes mellitus ([place]) 03/04/2015  . Verruca vulgaris 02/24/2015  . Nocturia 02/17/2015  . Accessory skin tags 02/17/2015  . Sinusitis, acute 06/04/2014  . Lumbar radiculopathy, acute 05/23/2014  . Acute left-sided back pain with sciatica 04/10/2014  . Smoker 04/10/2014  . Rash 02/28/2014  . Cough 01/01/2014  . Grief reaction 11/19/2013  . Primary localized osteoarthrosis, lower leg 09/04/2013  . Chronic pain syndrome 07/11/2013  . Hyperlipidemia 07/11/2013  . 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 Gonalgia 11/29/2012  . Preventative health care 09/14/2012  . Allergic rhinitis 09/14/2012  . DDD (degenerative disc disease), lumbar 09/14/2012  . Left sided sciatica 09/14/2012  . Erectile dysfunction 09/14/2012  . Pre-ulcerative corn or callous 09/14/2012  . Hemorrhoids 07/26/2011  . Chronic back pain greater than 3 months duration 09/23/2010  . Abnormal EKG 08/12/2010  . 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 CHF (congestive heart failure) ([place]) 06/29/2010  . Edema 06/10/2010  . Acute idiopathic pericarditis 04/08/2010  . NONSPECIFIC ABNORMAL ELECTROCARDIOGRAM 04/08/2010  . HYPERTRIGLYCERIDEMIA 05/26/2009  . SHOULDER PAIN, LEFT 04/21/2008  . DEPRESSION 03/11/2008  . Essential  hypertension 03/11/2008  . Gout, unspecified 05/01/2007  . METABOLIC SYNDROME X 47/42/5956  . OBESITY NOS 10/24/2006   Current Outpatient Prescriptions on File Prior to Visit  Medication Sig Dispense Refill  . amLODipine (NORVASC) 10 MG tablet Take 1 tablet (10 mg total) by mouth daily. 90 tablet 2  . Blood Glucose Monitoring Suppl (ACCU-CHEK NANO SMARTVIEW) W/DEVICE KIT Use glucose meter to check blood sugar 1-3 times daily as directed. 1 kit 0  . cetirizine (ZYRTEC) 5 MG tablet Take 5 mg by mouth daily.    . citalopram (CELEXA) 40 MG tablet TAKE 1 TABLET (40 MG TOTAL) BY MOUTH DAILY. 90 tablet 2  . fenofibrate (TRICOR) 145 MG tablet TAKE ONE TABLET BY MOUTH DAILY. 90 tablet 0  . fentaNYL (DURAGESIC - DOSED MCG/HR) 75 MCG/HR Place 50 mcg onto the skin every 3 (three) days.     . furosemide (LASIX) 40 MG tablet TAKE ONE TABLET BY MOUTH DAILY 90 tablet 0  . gabapentin (NEURONTIN) 100 MG capsule Take 3 capsules (300 mg total) by mouth 3 (three) times daily. 90 capsule 3  . glucose blood (ACCU-CHEK SMARTVIEW) test strip Use as instructed 100 each 12  . ibuprofen (ADVIL,MOTRIN) 800 MG tablet Take 800 mg by mouth every 8 (eight) hours as needed.    [name] lisinopril (PRINIVIL,ZESTRIL) 20 MG tablet TAKE ONE TABLET BY MOUTH TWICE A DAY 60 tablet 4  . metFORMIN (GLUCOPHAGE) 500 MG tablet TAKE ONE TABLET BY MOUTH TWICE A DAY WITH A MEAL 60 tablet 0  . oxyCODONE (OXY IR/ROXICODONE) 5 MG immediate release tablet Take 1-2 tablets (5-10 mg total) by mouth every 3 (three) hours as needed for breakthrough pain.  90 tablet 0  . oxyCODONE-acetaminophen (PERCOCET) 10-325 MG tablet Take 1 tablet by mouth every 6 (six) hours as needed for pain. 90 tablet 0  . Potassium Chloride ER 20 MEQ TBCR TAKE ONE TABLET BY MOUTH DAILY 90 tablet 0  . rosuvastatin (CRESTOR) 20 MG tablet TAKE 1 TABLET (20 MG TOTAL) BY MOUTH DAILY. 90 tablet 2  . sildenafil (REVATIO) 20 MG tablet Take 1-5 tablets (20-100 mg total) by mouth daily as  needed. 50 tablet 0  . VOLTAREN 1 % GEL Apply 2 g topically 4 (four) times daily. 100 g 5  . [DISCONTINUED] amoxicillin-clarithromycin-lansoprazole (PREVPAC) combo pack Take by mouth 2 (two) times daily. Follow package directions. 1 kit 0   No current facility-administered medications on file prior to visit.    Allergies  Allergen Reactions  . Diphenhydramine Itching, Palpitations and Other (See Comments)    "jittery"  . Other Itching and Swelling    Pecans: itching and swelling of the tongue   . Colchicine Diarrhea  . Lipitor [Atorvastatin] Other (See Comments)    Muscle cramps  . Septra [Sulfamethoxazole-Trimethoprim] Itching    No results found for this or any previous visit (from the past 2160 hour(s)).  Objective: General: Patient is awake, alert, and oriented x 3 and in no acute distress.  Integument: Skin is warm, dry and supple bilateral. Nails are tender, long, thickened and dystrophic with subungual debris, consistent with onychomycosis, 1-5 bilateral. Scaly skin in annular fashion suggestive of time with superimposed dry skin. No acute signs of infection. No open lesions or preulcerative lesions present bilateral. Remaining integument unremarkable.  Vasculature:  Dorsalis Pedis pulse 2/4 bilateral. Posterior Tibial pulse  2/4 bilateral.  Capillary fill time <3 sec 1-5 bilateral. Positive hair growth to the level of the digits. Temperature gradient within normal limits. No varicosities present bilateral. No edema present bilateral.   Neurology: The patient has intact sensation measured with a 5.07/10g Semmes Weinstein Monofilament at all pedal sites bilateral . Vibratory sensation intact bilateral with tuning fork. No Babinski sign present bilateral.   Musculoskeletal: No symptomatic pedal deformities noted bilateral. Muscular strength 5/5 in all lower extremity muscular groups bilateral without pain on range of motion. No tenderness with calf compression  bilateral.  Assessment and Plan: Problem List Items Addressed This Visit    None    Visit Diagnoses    Dermatophytosis of nail    -  Primary   Relevant Medications   terbinafine (LAMISIL AT) 1 % cream   Other Relevant Orders   Hepatic Function Panel   Dry skin       Relevant Medications   ammonium lactate (AMLACTIN) 12 % lotion   terbinafine (LAMISIL AT) 1 % cream   Tinea       Relevant Medications   terbinafine (LAMISIL AT) 1 % cream   Diabetes mellitus without complication ([facility])         -Examined patient. -Discussed and educated patient on diabetic foot care, especially with regards to the vascular, neurological and musculoskeletal systems.  -Stressed the importance of good glycemic control and the detriment of not controlling glucose levels in relation to the foot. -Mechanically debrided all nails 1-5 bilateral using sterile nail nipper and filed with dremel without incident  -Advised patient that in addition to lamisil cream may benefit from oral lamisil if liver panel is normal. Patient to get lab work done and will call patient if normal to start oral lamisil -Rx Lamisil cream for tinea -Rx Amlactin lotion for  dry skin -Answered all patient questions -Patient to return  in 3 months for at risk foot care -Patient advised to call the office if any problems or questions arise in the meantime.  Landis Martins, DPM

## 2016-06-06 ENCOUNTER — Other Ambulatory Visit: Payer: Self-pay | Admitting: Family

## 2016-06-06 ENCOUNTER — Other Ambulatory Visit: Payer: Self-pay | Admitting: Internal Medicine

## 2016-06-14 DIAGNOSIS — L2089 Other atopic dermatitis: Secondary | ICD-10-CM | POA: Diagnosis not present

## 2016-06-14 DIAGNOSIS — L83 Acanthosis nigricans: Secondary | ICD-10-CM | POA: Diagnosis not present

## 2016-06-17 ENCOUNTER — Other Ambulatory Visit (INDEPENDENT_AMBULATORY_CARE_PROVIDER_SITE_OTHER): Payer: Commercial Managed Care - HMO

## 2016-06-17 ENCOUNTER — Other Ambulatory Visit: Payer: Self-pay

## 2016-06-17 DIAGNOSIS — B351 Tinea unguium: Secondary | ICD-10-CM

## 2016-06-17 LAB — HEPATIC FUNCTION PANEL
ALT: 19 U/L (ref 0–53)
AST: 22 U/L (ref 0–37)
Albumin: 4.2 g/dL (ref 3.5–5.2)
Alkaline Phosphatase: 88 U/L (ref 39–117)
Bilirubin, Direct: 0.1 mg/dL (ref 0.0–0.3)
Total Bilirubin: 0.3 mg/dL (ref 0.2–1.2)
Total Protein: 6.6 g/dL (ref 6.0–8.3)

## 2016-06-20 ENCOUNTER — Telehealth: Payer: Self-pay | Admitting: *Deleted

## 2016-06-20 MED ORDER — TERBINAFINE HCL 250 MG PO TABS: 250.0000 mg | ORAL_TABLET | Freq: Every day | ORAL | 0 refills | Status: DC

## 2016-06-20 NOTE — Telephone Encounter (Addendum)
-----   Message from Landis Martins, [place] sent at 06/18/2016 11:12 AM EDT ----- Please let patient know that his bloodwork is normal and send to pharmacy Lamisil 250mg  PO daily, 90 tabs Thanks Dr. Cannon Kettle. 06/20/2016-Pt called for blood work results. I infomred pt of Dr. Leeanne Rio review of results and then ordered the Lamisil at [facility].

## 2016-06-21 MED ORDER — LISINOPRIL 20 MG PO TABS: 20.0000 mg | ORAL_TABLET | Freq: Two times a day (BID) | ORAL | 2 refills | Status: DC

## 2016-06-28 ENCOUNTER — Other Ambulatory Visit: Payer: Self-pay | Admitting: Family

## 2016-06-30 ENCOUNTER — Other Ambulatory Visit: Payer: Self-pay | Admitting: Family

## 2016-06-30 DIAGNOSIS — I10 Essential (primary) hypertension: Secondary | ICD-10-CM

## 2016-07-04 ENCOUNTER — Other Ambulatory Visit (INDEPENDENT_AMBULATORY_CARE_PROVIDER_SITE_OTHER): Payer: Medicare HMO

## 2016-07-04 ENCOUNTER — Encounter: Payer: Self-pay | Admitting: Family

## 2016-07-04 ENCOUNTER — Ambulatory Visit (INDEPENDENT_AMBULATORY_CARE_PROVIDER_SITE_OTHER): Payer: Medicare HMO | Admitting: Family

## 2016-07-04 VITALS — BP 122/84 | HR 71 | Temp 98.5°F | Ht 71.0 in | Wt 273.0 lb

## 2016-07-04 DIAGNOSIS — G4733 Obstructive sleep apnea (adult) (pediatric): Secondary | ICD-10-CM

## 2016-07-04 DIAGNOSIS — E119 Type 2 diabetes mellitus without complications: Secondary | ICD-10-CM | POA: Diagnosis not present

## 2016-07-04 DIAGNOSIS — N529 Male erectile dysfunction, unspecified: Secondary | ICD-10-CM | POA: Diagnosis not present

## 2016-07-04 DIAGNOSIS — G479 Sleep disorder, unspecified: Secondary | ICD-10-CM | POA: Insufficient documentation

## 2016-07-04 LAB — BASIC METABOLIC PANEL
BUN: 13 mg/dL (ref 6–23)
CO2: 26 mEq/L (ref 19–32)
Calcium: 9.3 mg/dL (ref 8.4–10.5)
Chloride: 98 mEq/L (ref 96–112)
Creatinine, Ser: 0.95 mg/dL (ref 0.40–1.50)
GFR: 108.39 mL/min (ref >60.00)
Glucose, Bld: 176 mg/dL — ABNORMAL HIGH (ref 70–99)
Potassium: 3.9 mEq/L (ref 3.5–5.1)
Sodium: 134 mEq/L — ABNORMAL LOW (ref 135–145)

## 2016-07-04 LAB — MICROALBUMIN / CREATININE URINE RATIO
Creatinine,U: 139.5 mg/dL
Microalb Creat Ratio: 1.1 mg/g (ref 0.0–30.0)
Microalb, Ur: 1.6 mg/dL (ref 0.0–1.9)

## 2016-07-04 LAB — HEMOGLOBIN A1C: Hgb A1c MFr Bld: 7.6 % — ABNORMAL HIGH (ref 4.6–6.5)

## 2016-07-04 MED ORDER — DOXYCYCLINE HYCLATE 100 MG PO TABS: 100.0000 mg | ORAL_TABLET | Freq: Two times a day (BID) | ORAL | 0 refills | Status: DC

## 2016-07-04 MED ORDER — SILDENAFIL CITRATE 20 MG PO TABS: 20.0000 mg | ORAL_TABLET | Freq: Every day | ORAL | 2 refills | Status: DC | PRN

## 2016-07-04 NOTE — Assessment & Plan Note (Signed)
Obtain hemoglobin A1c, urine microalbumin, and complete metabolic panel. Previous A1c shows acceptable control of blood sugar with goal ideally below 7. Continue current dosage of metformin pending A1c results. Maintained on rosuvastatin and lisinopril for CAD risk reduction. Pneumovax is up-to-date. Encouraged complete diabetic eye exam independently. Diabetic foot exam is up-to-date. Continue to monitor blood sugar at home 1-2 times daily. Follow-up in 3 months or sooner pending A1c results.

## 2016-07-04 NOTE — Progress Notes (Signed)
Pre visit review using our clinic review tool, if applicable. No additional management support is needed unless otherwise documented below in the visit note. 

## 2016-07-04 NOTE — Patient Instructions (Addendum)
Thank you for choosing [facility].  SUMMARY AND INSTRUCTIONS:  They will call to schedule your appointment for sleep medicine.   Continue to take your medications as prescribed.   Follow up pending your lab work results.   Start the doxycycline for your sinuses.   Medication:  Your prescription(s) have been submitted to your pharmacy or been printed and provided for you. Please take as directed and contact our office if you believe you are having problem(s) with the medication(s) or have any questions.  Labs:  Please stop by the lab on the lower level of the building for your blood work. Your results will be released to [place] (or called to you) after review, usually within 72 hours after test completion. If any changes need to be made, you will be notified at that same time.  1.) The lab is open from 7:30am to 5:30 pm Monday-Friday 2.) No appointment is necessary 3.) Fasting (if needed) is 6-8 hours after food and drink; black coffee and water are okay   Referrals:  Referrals have been made during this visit. You should expect to hear back from our schedulers in about 7-10 days in regards to establishing an appointment with the specialists we discussed.   Follow up:  If your symptoms worsen or fail to improve, please contact our office for further instruction, or in case of emergency go directly to the emergency room at the closest medical facility.

## 2016-07-04 NOTE — Progress Notes (Signed)
Subjective:    Patient ID: Harold [name], male    DOB: 20-Aug-1965, 51 y.o.   MRN: [id number]  Chief Complaint  Patient presents with  . Diabetes    HPI:  Harold [name] is a 51 y.o. male who  has a past medical history of Acute pericarditis; Allergic rhinitis, cause unspecified (09/14/2012); Anxiety; Chronic back pain; DDD (degenerative disc disease), lumbar (09/14/2012); Depression; Diabetes mellitus without complication ([place]); Erectile dysfunction (09/14/2012); GERD (gastroesophageal reflux disease); Gout; Headache; History of pancreatitis; Hyperlipidemia (07/11/2013); Hypertension; Hypertriglyceridemia; Left sided sciatica (09/14/2012); Metabolic syndrome; Obesity; OSA (obstructive sleep apnea); and Restless legs. and presents today for a follow up office visit.   1.) Type 2 diabetes - Currently maintained on metformin. Reports taking the medication as prescribed and denies adverse side effects or hypoglycemic readings. Blood sugars at home have been under 100 . Denies new symptoms of end organ damage. No excessive hunger, thirst, or urination. Working on a low/carbohydrate modified oral intake. Physical activity is minimal secondary to back pain.  Lab Results  Component Value Date   HGBA1C 7.6 (H) 07/04/2016     Lab Results  Component Value Date   CREATININE 0.95 07/04/2016   BUN 13 07/04/2016   NA 134 (L) 07/04/2016   K 3.9 07/04/2016   CL 98 07/04/2016   CO2 26 07/04/2016    2.) Sleep apnea - Previously diagnosed with sleep apnea in April 2012 with sleep study showing AHI of 78. He later underwent a CPAP titration study. Currently not using CPAP and experiencing the associated symptoms of waking up short of breath, increased urinary frequency, hypersomnolence, snoring and periods of apnea. Denies daytime headaches. Would like to re-establish CPAP therapy.    Allergies  Allergen Reactions  . Diphenhydramine Itching, Palpitations and Other (See Comments)    "jittery"  .  Other Itching and Swelling    Pecans: itching and swelling of the tongue   . Colchicine Diarrhea  . Lipitor [Atorvastatin] Other (See Comments)    Muscle cramps  . Septra [Sulfamethoxazole-Trimethoprim] Itching      Outpatient Medications Prior to Visit  Medication Sig Dispense Refill  . amLODipine (NORVASC) 10 MG tablet TAKE ONE TABLET BY MOUTH DAILY 90 tablet 1  . ammonium lactate (AMLACTIN) 12 % lotion Apply 1 application topically as needed for dry skin. 400 g 3  . Blood Glucose Monitoring Suppl (ACCU-CHEK NANO SMARTVIEW) W/DEVICE KIT Use glucose meter to check blood sugar 1-3 times daily as directed. 1 kit 0  . cetirizine (ZYRTEC) 5 MG tablet Take 5 mg by mouth daily.    . citalopram (CELEXA) 40 MG tablet Take 1 tablet (40 mg total) by mouth daily. Yearly physical w/labs due in May must see MD for refills 90 tablet 0  . fenofibrate (TRICOR) 145 MG tablet TAKE ONE TABLET BY MOUTH DAILY. 90 tablet 0  . fentaNYL (DURAGESIC - DOSED MCG/HR) 75 MCG/HR Place 50 mcg onto the skin every 3 (three) days.     . furosemide (LASIX) 40 MG tablet TAKE ONE TABLET BY MOUTH DAILY 90 tablet 0  . gabapentin (NEURONTIN) 100 MG capsule Take 3 capsules (300 mg total) by mouth 3 (three) times daily. 90 capsule 3  . glucose blood (ACCU-CHEK SMARTVIEW) test strip Use as instructed 100 each 12  . ibuprofen (ADVIL,MOTRIN) 800 MG tablet Take 800 mg by mouth every 8 (eight) hours as needed.    [name] lisinopril (PRINIVIL,ZESTRIL) 20 MG tablet Take 1 tablet (20 mg total) by mouth 2 (  two) times daily. 180 tablet 2  . metFORMIN (GLUCOPHAGE) 500 MG tablet TAKE ONE TABLET BY MOUTH TWICE A DAY WITH A MEAL 60 tablet 0  . oxyCODONE (OXY IR/ROXICODONE) 5 MG immediate release tablet Take 1-2 tablets (5-10 mg total) by mouth every 3 (three) hours as needed for breakthrough pain. 90 tablet 0  . oxyCODONE-acetaminophen (PERCOCET) 10-325 MG tablet Take 1 tablet by mouth every 6 (six) hours as needed for pain. 90 tablet 0  . Potassium  Chloride ER 20 MEQ TBCR TAKE ONE TABLET BY MOUTH DAILY 90 tablet 0  . rosuvastatin (CRESTOR) 20 MG tablet Take 1 tablet (20 mg total) by mouth daily. Yearly physical w/labs due in May must see MD for refills 90 tablet 0  . terbinafine (LAMISIL AT) 1 % cream Apply 1 application topically 2 (two) times daily. 36 g 3  . terbinafine (LAMISIL) 250 MG tablet Take 1 tablet (250 mg total) by mouth daily. 90 tablet 0  . VOLTAREN 1 % GEL Apply 2 g topically 4 (four) times daily. 100 g 5  . sildenafil (REVATIO) 20 MG tablet Take 1-5 tablets (20-100 mg total) by mouth daily as needed. 50 tablet 0   No facility-administered medications prior to visit.       Past Surgical History:  Procedure Laterality Date  . COLONOSCOPY    . KNEE SURGERY     right  . LUMBAR LAMINECTOMY/DECOMPRESSION MICRODISCECTOMY N/A 08/26/2015   Procedure: Lumbar Two-Sacral One  Laminectomy for decompression;  Surgeon: [name], MD;  Location: [facility];  Service: Neurosurgery;  Laterality: N/A;  . pericardectomy  07/15/2010   Dr. [name]  . pleurx catheter placement  07/15/2010   Dr. [name]  . SHOULDER SURGERY     right and left shoulders  . TONSILLECTOMY AND ADENOIDECTOMY     one tonsil      Past Medical History:  Diagnosis Date  . Acute pericarditis    admx 03-26-10 thru 03-29-10; a. echo 03-26-10: EF 55-60%; mild LVH; trivial MR; RVF ok; mild -mod circumferential Eff w/o tamponade  . Allergic rhinitis, cause unspecified 09/14/2012  . Anxiety   . Chronic back pain   . DDD (degenerative disc disease), lumbar 09/14/2012  . Depression   . Diabetes mellitus without complication ([place])   . Erectile dysfunction 09/14/2012  . GERD (gastroesophageal reflux disease)   . Gout    pt denies  . Headache    migraines in the past  . History of pancreatitis    a. admx 04-2009.Marland KitchenMarland Kitchen? 2-2 triglycerides  . Hyperlipidemia 07/11/2013  . Hypertension   . Hypertriglyceridemia    a. followed by [facility]  . Left  sided sciatica 09/14/2012  . Metabolic syndrome   . Obesity   . OSA (obstructive sleep apnea)    does not use cpap  . Restless legs       Review of Systems  Eyes:       Negative for changes in vision.  Respiratory: Negative for chest tightness and shortness of breath.   Cardiovascular: Negative for chest pain, palpitations and leg swelling.  Endocrine: Negative for polydipsia, polyphagia and polyuria.  Neurological: Negative for dizziness, weakness, light-headedness and headaches.  Psychiatric/Behavioral: Positive for sleep disturbance.      Objective:    BP 122/84   Pulse 71   Temp 98.5 F (36.9 C) (Oral)   Ht '5\' 11"'  (1.803 m)   Wt 273 lb (123.8 kg)   SpO2 99%   BMI 38.08 kg/m  Nursing note and vital signs reviewed.  Physical Exam  Constitutional: He is oriented to person, place, and time. He appears well-developed and well-nourished. No distress.  Cardiovascular: Normal rate, regular rhythm, normal heart sounds and intact distal pulses.   Pulmonary/Chest: Effort normal and breath sounds normal.  Neurological: He is alert and oriented to person, place, and time.  Skin: Skin is warm and dry.  Psychiatric: He has a normal mood and affect. His behavior is normal. Judgment and thought content normal.       Assessment & Plan:   Problem List Items Addressed This Visit      Respiratory   OSA (obstructive sleep apnea)    Appears a diagnosed with obstructive sleep apnea with AHI of 78 per hour and maintain on CPAP which she is not currently using. Requesting referral to sleep medicine as he continues to experience snoring, urinary frequency, and excessive daytime somnolence. Would significantly benefit patient given comorbid conditions which may help reduce A1c. Referral to neurology placed for sleep medicine. Continue to monitor.      Relevant Orders   Ambulatory referral to Neurology     Endocrine   Type 2 diabetes mellitus ([place]) - Primary    Obtain hemoglobin A1c,  urine microalbumin, and complete metabolic panel. Previous A1c shows acceptable control of blood sugar with goal ideally below 7. Continue current dosage of metformin pending A1c results. Maintained on rosuvastatin and lisinopril for CAD risk reduction. Pneumovax is up-to-date. Encouraged complete diabetic eye exam independently. Diabetic foot exam is up-to-date. Continue to monitor blood sugar at home 1-2 times daily. Follow-up in 3 months or sooner pending A1c results.      Relevant Orders   Basic Metabolic Panel (BMET) (Completed)   Hemoglobin A1c (Completed)   Urine Microalbumin w/creat. ratio (Completed)     Genitourinary   Erectile dysfunction   Relevant Medications   sildenafil (REVATIO) 20 MG tablet       I am having Mr. Bernards start on doxycycline. I am also having him maintain his VOLTAREN, ACCU-CHEK NANO SMARTVIEW, glucose blood, cetirizine, gabapentin, oxyCODONE, oxyCODONE-acetaminophen, ibuprofen, fentaNYL, furosemide, fenofibrate, ammonium lactate, terbinafine, rosuvastatin, citalopram, amLODipine, terbinafine, lisinopril, metFORMIN, Potassium Chloride ER, and sildenafil.   Meds ordered this encounter  Medications  . sildenafil (REVATIO) 20 MG tablet    Sig: Take 1-5 tablets (20-100 mg total) by mouth daily as needed.    Dispense:  50 tablet    Refill:  2    Order Specific Question:   Supervising Provider    Answer:   Pricilla Holm A [[id number]]  . doxycycline (VIBRA-TABS) 100 MG tablet    Sig: Take 1 tablet (100 mg total) by mouth 2 (two) times daily.    Dispense:  20 tablet    Refill:  0    Order Specific Question:   Supervising Provider    Answer:   Pricilla Holm A [[id number]]     Follow-up: Return in about 3 months (around 10/03/2016), or if symptoms worsen or fail to improve.  Mauricio Po, FNP

## 2016-07-04 NOTE — Assessment & Plan Note (Signed)
Appears a diagnosed with obstructive sleep apnea with AHI of 78 per hour and maintain on CPAP which she is not currently using. Requesting referral to sleep medicine as he continues to experience snoring, urinary frequency, and excessive daytime somnolence. Would significantly benefit patient given comorbid conditions which may help reduce A1c. Referral to neurology placed for sleep medicine. Continue to monitor.

## 2016-07-05 ENCOUNTER — Encounter: Payer: Self-pay | Admitting: Family

## 2016-07-05 DIAGNOSIS — M47817 Spondylosis without myelopathy or radiculopathy, lumbosacral region: Secondary | ICD-10-CM | POA: Diagnosis not present

## 2016-07-05 DIAGNOSIS — M25561 Pain in right knee: Secondary | ICD-10-CM | POA: Diagnosis not present

## 2016-07-05 DIAGNOSIS — M47818 Spondylosis without myelopathy or radiculopathy, sacral and sacrococcygeal region: Secondary | ICD-10-CM | POA: Diagnosis not present

## 2016-07-12 ENCOUNTER — Telehealth: Payer: Self-pay | Admitting: Family

## 2016-07-12 NOTE — Telephone Encounter (Signed)
Patient is requesting to have a PPV on mychart. Ok to schedule on a nurse visit? Please advise. Thank you.

## 2016-07-14 NOTE — Telephone Encounter (Signed)
I LVM and a mychart message to inform patient to get back to me so we could set up an appointment. Thank you.

## 2016-07-14 NOTE — Telephone Encounter (Signed)
That is ok per Marya Amsler. Just can not be scheduled on a Thursday.

## 2016-07-15 DIAGNOSIS — M1711 Unilateral primary osteoarthritis, right knee: Secondary | ICD-10-CM | POA: Diagnosis not present

## 2016-07-19 ENCOUNTER — Ambulatory Visit: Payer: Medicaid Other | Admitting: General Practice

## 2016-07-25 DIAGNOSIS — M47817 Spondylosis without myelopathy or radiculopathy, lumbosacral region: Secondary | ICD-10-CM | POA: Diagnosis not present

## 2016-07-27 NOTE — Progress Notes (Deleted)
Cardiology Office Note   Date:  07/27/2016   ID:  Harold [name], DOB 10/25/1965, MRN [id number]  PCP:  Mauricio Po, FNP  Cardiologist:   Minus Breeding, MD   No chief complaint on file.     History of Present Illness: Harold [name] is a 51 y.o. male who presents for evaluation of multiple cardiovascular risk factors. I saw him in the past for pericarditis.    I sent him for a POET (Plain Old Exercise Treadmill) after the last visit.  He had no ischemia but did have a hypertensive response.   He returns for follow up.  ***  Since I last saw him he had back surgery.  He is having some back pain but leg numbness is improved.  The patient denies any new symptoms such as chest discomfort, neck or arm discomfort. There has been no new shortness of breath, PND or orthopnea. There have been no reported palpitations, presyncope or syncope.    Past Medical History:  Diagnosis Date  . Acute pericarditis    admx 03-26-10 thru 03-29-10; a. echo 03-26-10: EF 55-60%; mild LVH; trivial MR; RVF ok; mild -mod circumferential Eff w/o tamponade  . Allergic rhinitis, cause unspecified 09/14/2012  . Anxiety   . Chronic back pain   . DDD (degenerative disc disease), lumbar 09/14/2012  . Depression   . Diabetes mellitus without complication ([place])   . Erectile dysfunction 09/14/2012  . GERD (gastroesophageal reflux disease)   . Gout    pt denies  . Headache    migraines in the past  . History of pancreatitis    a. admx 04-2009.Marland KitchenMarland Kitchen? 2-2 triglycerides  . Hyperlipidemia 07/11/2013  . Hypertension   . Hypertriglyceridemia    a. followed by [facility]  . Left sided sciatica 09/14/2012  . Metabolic syndrome   . Obesity   . OSA (obstructive sleep apnea)    does not use cpap  . Restless legs     Past Surgical History:  Procedure Laterality Date  . COLONOSCOPY    . KNEE SURGERY     right  . LUMBAR LAMINECTOMY/DECOMPRESSION MICRODISCECTOMY N/A 08/26/2015   Procedure: Lumbar Two-Sacral One   Laminectomy for decompression;  Surgeon: [name], MD;  Location: [facility];  Service: Neurosurgery;  Laterality: N/A;  . pericardectomy  07/15/2010   Dr. [name]  . pleurx catheter placement  07/15/2010   Dr. [name]  . SHOULDER SURGERY     right and left shoulders  . TONSILLECTOMY AND ADENOIDECTOMY     one tonsil     Current Outpatient Prescriptions  Medication Sig Dispense Refill  . amLODipine (NORVASC) 10 MG tablet TAKE ONE TABLET BY MOUTH DAILY 90 tablet 1  . ammonium lactate (AMLACTIN) 12 % lotion Apply 1 application topically as needed for dry skin. 400 g 3  . Blood Glucose Monitoring Suppl (ACCU-CHEK NANO SMARTVIEW) W/DEVICE KIT Use glucose meter to check blood sugar 1-3 times daily as directed. 1 kit 0  . cetirizine (ZYRTEC) 5 MG tablet Take 5 mg by mouth daily.    . citalopram (CELEXA) 40 MG tablet Take 1 tablet (40 mg total) by mouth daily. Yearly physical w/labs due in May must see MD for refills 90 tablet 0  . doxycycline (VIBRA-TABS) 100 MG tablet Take 1 tablet (100 mg total) by mouth 2 (two) times daily. 20 tablet 0  . fenofibrate (TRICOR) 145 MG tablet TAKE ONE TABLET BY MOUTH DAILY. 90 tablet 0  . fentaNYL (  DURAGESIC - DOSED MCG/HR) 75 MCG/HR Place 50 mcg onto the skin every 3 (three) days.     . furosemide (LASIX) 40 MG tablet TAKE ONE TABLET BY MOUTH DAILY 90 tablet 0  . gabapentin (NEURONTIN) 100 MG capsule Take 3 capsules (300 mg total) by mouth 3 (three) times daily. 90 capsule 3  . glucose blood (ACCU-CHEK SMARTVIEW) test strip Use as instructed 100 each 12  . ibuprofen (ADVIL,MOTRIN) 800 MG tablet Take 800 mg by mouth every 8 (eight) hours as needed.    [name] lisinopril (PRINIVIL,ZESTRIL) 20 MG tablet Take 1 tablet (20 mg total) by mouth 2 (two) times daily. 180 tablet 2  . metFORMIN (GLUCOPHAGE) 500 MG tablet TAKE ONE TABLET BY MOUTH TWICE A DAY WITH A MEAL 60 tablet 0  . oxyCODONE (OXY IR/ROXICODONE) 5 MG immediate release tablet Take 1-2 tablets (5-10  mg total) by mouth every 3 (three) hours as needed for breakthrough pain. 90 tablet 0  . oxyCODONE-acetaminophen (PERCOCET) 10-325 MG tablet Take 1 tablet by mouth every 6 (six) hours as needed for pain. 90 tablet 0  . Potassium Chloride ER 20 MEQ TBCR TAKE ONE TABLET BY MOUTH DAILY 90 tablet 0  . rosuvastatin (CRESTOR) 20 MG tablet Take 1 tablet (20 mg total) by mouth daily. Yearly physical w/labs due in May must see MD for refills 90 tablet 0  . sildenafil (REVATIO) 20 MG tablet Take 1-5 tablets (20-100 mg total) by mouth daily as needed. 50 tablet 2  . terbinafine (LAMISIL AT) 1 % cream Apply 1 application topically 2 (two) times daily. 36 g 3  . terbinafine (LAMISIL) 250 MG tablet Take 1 tablet (250 mg total) by mouth daily. 90 tablet 0  . VOLTAREN 1 % GEL Apply 2 g topically 4 (four) times daily. 100 g 5   No current facility-administered medications for this visit.     Allergies:   Diphenhydramine; Other; Colchicine; Lipitor [atorvastatin]; and Septra [sulfamethoxazole-trimethoprim]    ROS:  Please see the history of present illness.   Otherwise, review of systems are positive for ***.   All other systems are reviewed and negative.    PHYSICAL EXAM: VS:  There were no vitals taken for this visit. , BMI There is no height or weight on file to calculate BMI. GENERAL:  Well appearing *** NECK:  No jugular venous distention, waveform within normal limits, carotid upstroke brisk and symmetric, no bruits, no thyromegaly LUNGS:  Clear to auscultation bilaterally BACK:  No CVA tenderness CHEST:  Unremarkable HEART:  PMI not displaced or sustained,S1 and S2 within normal limits, no S3, no S4, no clicks, no rubs, no murmurs ABD:  Flat, positive bowel sounds normal in frequency in pitch, no bruits, no rebound, no guarding, no midline pulsatile mass, no hepatomegaly, no splenomegaly EXT:  2 plus pulses throughout, no edema, no cyanosis no clubbing   EKG:  ***  EKG is not ordered  today.    Recent Labs: 08/25/2015: Hemoglobin 15.0; Platelets 381 06/17/2016: ALT 19 07/04/2016: BUN 13; Creatinine, Ser 0.95; Potassium 3.9; Sodium 134    Lipid Panel    Component Value Date/Time   CHOL 146 11/19/2013 1206   TRIG 145.0 11/19/2013 1206   HDL 37.80 (L) 11/19/2013 1206   CHOLHDL 4 11/19/2013 1206   VLDL 29.0 11/19/2013 1206   LDLCALC 79 11/19/2013 1206   LDLDIRECT 124.5 09/14/2012 1044   Lab Results  Component Value Date   HGBA1C 7.6 (H) 07/04/2016      Wt  Readings from Last 3 Encounters:  07/04/16 273 lb (123.8 kg)  10/27/15 267 lb (121.1 kg)  10/19/15 266 lb (120.7 kg)      Other studies Reviewed: Additional studies/ records that were reviewed today include: *** Review of the above records demonstrates:  ***  ASSESSMENT AND PLAN:  SOB:   ***This is improved.  I don't suspect any cardiac etiology.  After he recovers from his back surgery hopefully he will be able to walk and his breathing should improve with increased activity.  MORBID OBESITY:  ***The patient understands the need to lose weight with diet and exercise. We have discussed specific strategies for this.  DM:  His A1c was *** 7.6 recently.  ***However, he's changed his diet and his activity and thinks it slowly coming down.  RISK REDUCTION:  He will stop smoking.  ***We talked about this again today.  HTN:  BP is *** good today.  He says that it is high sometimes at home.  However, he needs a big cuff.  He will follow this at home.  In addition he has a [facility] appt and hopefully will have better control once apnea is treated.     SLEEP APNEA:  He has a sleep clinic appt for today.  ***   Current medicines are reviewed at length with the patient today.  The patient does not have concerns regarding medicines.  The following changes have been made:  ***  Labs/ tests ordered today include:  ***  No orders of the defined types were placed in this encounter.    Disposition:   FU  with me in *** months.    Signed, Minus Breeding, MD  07/27/2016 1:06 PM     [facility]

## 2016-07-28 ENCOUNTER — Ambulatory Visit (INDEPENDENT_AMBULATORY_CARE_PROVIDER_SITE_OTHER): Payer: Medicare HMO | Admitting: Neurology

## 2016-07-28 ENCOUNTER — Ambulatory Visit: Payer: Medicare HMO | Admitting: Cardiology

## 2016-07-28 ENCOUNTER — Encounter: Payer: Self-pay | Admitting: Neurology

## 2016-07-28 VITALS — BP 140/72 | HR 80 | Resp 18 | Ht 71.0 in | Wt 266.0 lb

## 2016-07-28 DIAGNOSIS — F17200 Nicotine dependence, unspecified, uncomplicated: Secondary | ICD-10-CM | POA: Diagnosis not present

## 2016-07-28 DIAGNOSIS — F1120 Opioid dependence, uncomplicated: Secondary | ICD-10-CM

## 2016-07-28 DIAGNOSIS — G4733 Obstructive sleep apnea (adult) (pediatric): Secondary | ICD-10-CM | POA: Diagnosis not present

## 2016-07-28 DIAGNOSIS — R351 Nocturia: Secondary | ICD-10-CM | POA: Diagnosis not present

## 2016-07-28 DIAGNOSIS — Z8679 Personal history of other diseases of the circulatory system: Secondary | ICD-10-CM | POA: Diagnosis not present

## 2016-07-28 DIAGNOSIS — G4731 Primary central sleep apnea: Secondary | ICD-10-CM

## 2016-07-28 NOTE — Progress Notes (Signed)
Subjective:    Patient ID: ALEXIE SAMSON is a 51 y.o. male.  HPI     Star Age, MD, PhD [facility] [address].O. [place], [place] [address]  Dear Belenda Cruise,   I saw your patient, Nathian Stencil, upon your kind request in my neurologic clinic today for initial consultation of his sleep disorder, in particular, concern for underlying obstructive sleep apnea. The patient is accompanied by his friend, [name], today. As you know, Mr. Curran is a 51 year old right-handed gentleman with an underlying medical history of pericarditis, allergic rhinitis, degenerative disc disease, depression, diabetes, ED, reflux disease, gout, hyperlipidemia, hypertension, restless leg syndrome, and obesity, who was previously diagnosed with obstructive sleep apnea. He had a sleep study over 5 years ago but is no longer using CPAP therapy. I reviewed your office note from 07/04/2016. Prior sleep study results were reviewed. He had a sleep study on 06/29/2010, interpreted per Dr. Gwenette Greet. His overall AHI was 77.5 per hour, he had many central apneas. He had no significant PLMS. He had very little REM sleep and significant sleep fragmentation. O2 nadir was 69%. He apparently had a CPAP titration study as well. CPAP download is not available for my review today. He is no longer using his CPAP machine, had not used it in at least 1-1/2 years, maybe 2 years, as he lost the machine, he was using it regularly and felt it helped.  He was using CPAP plus oxygen in the past, now neither. He smokes about half a pack per day. Of note, he has residual low back pain. He is status post back surgery, he has undergone 3 knee surgeries on the right and has residual right knee pain. He is on chronic narcotic pain medication, managed by Dr. Maryjean Ka. For his history of pericarditis he is followed by Dr. [name]. The patient is single, he lives with a roommate. He is engaged. He has one 54-year-old son  who lives in [place] and visits him in the summer.  Patient drinks alcohol infrequently, currently none in the last few weeks. He does not drink caffeine on a daily basis, occasional soda, occasional tea. He tries to drink a lot of water. He reports significant nocturia, about 5 times per average night. He has a very irregular sleep schedule. Goes to bed around 2 or 3 AM and wakeup time varies, ranges from 7 AM to 2 PM. Of note, he had right tonsillectomy about 13 or 14 years ago under Dr. Ernesto Rutherford. He reports having had recurrent tonsillitis. He recalls being on CPAP of 12 cm and perhaps oxygen at 3 L. His Epworth sleepiness score is 7 out of 24, fatigue score is 20 out of 63. He has a family history of OSA and several cousins, he suspects his sister has it. He has intentions of smoking cessation, has been given a prescription for Chantix but did not try it for fear of side effects.  His Past Medical History Is Significant For: Past Medical History:  Diagnosis Date  . Acute pericarditis    admx 03-26-10 thru 03-29-10; a. echo 03-26-10: EF 55-60%; mild LVH; trivial MR; RVF ok; mild -mod circumferential Eff w/o tamponade  . Allergic rhinitis, cause unspecified 09/14/2012  . Anxiety   . Chronic back pain   . DDD (degenerative disc disease), lumbar 09/14/2012  . Depression   . Diabetes mellitus without complication ([place])   . Erectile dysfunction 09/14/2012  . GERD (gastroesophageal reflux disease)   . Gout  pt denies  . Headache    migraines in the past  . History of pancreatitis    a. admx 04-2009.Marland KitchenMarland Kitchen? 2-2 triglycerides  . Hyperlipidemia 07/11/2013  . Hypertension   . Hypertriglyceridemia    a. followed by [facility]  . Left sided sciatica 09/14/2012  . Metabolic syndrome   . Obesity   . OSA (obstructive sleep apnea)    does not use cpap  . Restless legs     His Past Surgical History Is Significant For: Past Surgical History:  Procedure Laterality Date  . COLONOSCOPY    . KNEE  SURGERY     right  . LUMBAR LAMINECTOMY/DECOMPRESSION MICRODISCECTOMY N/A 08/26/2015   Procedure: Lumbar Two-Sacral One  Laminectomy for decompression;  Surgeon: [name], MD;  Location: [facility];  Service: Neurosurgery;  Laterality: N/A;  . pericardectomy  07/15/2010   Dr. [name]  . pleurx catheter placement  07/15/2010   Dr. [name]  . SHOULDER SURGERY     right and left shoulders  . TONSILLECTOMY AND ADENOIDECTOMY     one tonsil    His Family History Is Significant For: Family History  Problem Relation Age of Onset  . Diabetes Mother   . Diabetes Father   . Colon cancer Father   . Heart disease Father 39    CABG  . Pancreatitis Father   . Hypertension Other     entire family  . Liver cancer Paternal Uncle   . Diabetes Sister   . Stomach cancer Maternal Grandfather   . Pancreatitis Paternal Uncle     x 2  . Esophageal cancer Neg Hx   . Rectal cancer Neg Hx     His Social History Is Significant For: Social History   Social History  . Marital status: Divorced    Spouse name: N/A  . Number of children: 1  . Years of education: N/A   Occupational History  . disabled Unemployed   Social History Main Topics  . Smoking status: Current Every Day Smoker    Packs/day: 0.25    Years: 20.00    Types: Cigarettes  . Smokeless tobacco: Never Used  . Alcohol use 0.0 oz/week     Comment: several times a week and mostly on the weekends  . Drug use: No  . Sexual activity: Not Asked   Other Topics Concern  . None   Social History Narrative   Regular exercise- No   Rare caffeine use     His Allergies Are:  Allergies  Allergen Reactions  . Diphenhydramine Itching, Palpitations and Other (See Comments)    "jittery"  . Other Itching and Swelling    Pecans: itching and swelling of the tongue   . Colchicine Diarrhea  . Lipitor [Atorvastatin] Other (See Comments)    Muscle cramps  . Septra [Sulfamethoxazole-Trimethoprim] Itching  :   His Current  Medications Are:  Outpatient Encounter Prescriptions as of 07/28/2016  Medication Sig  . amLODipine (NORVASC) 10 MG tablet TAKE ONE TABLET BY MOUTH DAILY  . ammonium lactate (AMLACTIN) 12 % lotion Apply 1 application topically as needed for dry skin.  . Blood Glucose Monitoring Suppl (ACCU-CHEK NANO SMARTVIEW) W/DEVICE KIT Use glucose meter to check blood sugar 1-3 times daily as directed.  . cetirizine (ZYRTEC) 5 MG tablet Take 5 mg by mouth daily.  . citalopram (CELEXA) 40 MG tablet Take 1 tablet (40 mg total) by mouth daily. Yearly physical w/labs due in May must see MD for refills  .  fenofibrate (TRICOR) 145 MG tablet TAKE ONE TABLET BY MOUTH DAILY.  . fentaNYL (DURAGESIC - DOSED MCG/HR) 75 MCG/HR Place 50 mcg onto the skin every 3 (three) days.   . furosemide (LASIX) 40 MG tablet TAKE ONE TABLET BY MOUTH DAILY  . gabapentin (NEURONTIN) 100 MG capsule Take 3 capsules (300 mg total) by mouth 3 (three) times daily.  [name] glucose blood (ACCU-CHEK SMARTVIEW) test strip Use as instructed  . ibuprofen (ADVIL,MOTRIN) 800 MG tablet Take 800 mg by mouth every 8 (eight) hours as needed.  [name] lisinopril (PRINIVIL,ZESTRIL) 20 MG tablet Take 1 tablet (20 mg total) by mouth 2 (two) times daily.  . metFORMIN (GLUCOPHAGE) 500 MG tablet TAKE ONE TABLET BY MOUTH TWICE A DAY WITH A MEAL  . oxyCODONE (OXY IR/ROXICODONE) 5 MG immediate release tablet Take 1-2 tablets (5-10 mg total) by mouth every 3 (three) hours as needed for breakthrough pain.  [name] oxyCODONE-acetaminophen (PERCOCET) 10-325 MG tablet Take 1 tablet by mouth every 6 (six) hours as needed for pain.  [name] Potassium Chloride ER 20 MEQ TBCR TAKE ONE TABLET BY MOUTH DAILY  . rosuvastatin (CRESTOR) 20 MG tablet Take 1 tablet (20 mg total) by mouth daily. Yearly physical w/labs due in May must see MD for refills  . sildenafil (REVATIO) 20 MG tablet Take 1-5 tablets (20-100 mg total) by mouth daily as needed.  . terbinafine (LAMISIL AT) 1 % cream Apply 1 application  topically 2 (two) times daily.  [name] terbinafine (LAMISIL) 250 MG tablet Take 1 tablet (250 mg total) by mouth daily.  . VOLTAREN 1 % GEL Apply 2 g topically 4 (four) times daily.  . [DISCONTINUED] doxycycline (VIBRA-TABS) 100 MG tablet Take 1 tablet (100 mg total) by mouth 2 (two) times daily.   No facility-administered encounter medications on file as of 07/28/2016.   :  Review of Systems:  Out of a complete 14 point review of systems, all are reviewed and negative with the exception of these symptoms as listed below:  Review of Systems  Neurological:       Patient states that he last had a sleep study around 2012. He does not currently have his CPAP, has not used in 1.5 years.   Patient has trouble falling and staying asleep, daytime fatigue, takes naps, snores, witnessed apnea.   Epworth Sleepiness Scale 0= would never doze 1= slight chance of dozing 2= moderate chance of dozing 3= high chance of dozing  Sitting and reading:2 Watching TV:0 Sitting inactive in a public place (ex. Theater or meeting):0 As a passenger in a car for an hour without a break:1 Lying down to rest in the afternoon:2 Sitting and talking to someone:0 Sitting quietly after lunch (no alcohol):2 In a car, while stopped in traffic:0 Total:7  Objective:  Neurologic Exam  Physical Exam Physical Examination:   Vitals:   07/28/16 1622  BP: 140/72  Pulse: 80  Resp: 18    General Examination: The patient is a very pleasant 51 y.o. male in no acute distress. He appears well-developed and well-nourished and adequately groomed.   HEENT: Normocephalic, atraumatic, pupils are equal, round and reactive to light and accommodation. Extraocular tracking is good without limitation to gaze excursion or nystagmus noted. Normal smooth pursuit is noted. Hearing is grossly intact. Face is symmetric with normal facial animation and normal facial sensation. Speech is clear with no dysarthria noted. There is no hypophonia.  There is no lip, neck/head, jaw or voice tremor. Neck is supple with full range  of passive and active motion. There are no carotid bruits on auscultation. Oropharynx exam reveals: mild mouth dryness, adequate dental hygiene and marked airway crowding, due to large L tonsils, thick soft palate, large uvula, large tongue. Mallampati is class II. Tongue protrudes centrally and palate elevates symmetrically. Neck size is 20 3/8 inches. He has a Mild overbite.    Chest: U, unremarkable surgical scar.   Heart: Unremarkable exam.   Abdomen: Soft, non-tender and non-distended with normal bowel sounds appreciated on auscultation.  Extremities: There is trace pitting edema in the distal lower extremities bilaterally. Pedal pulses are intact.  Skin: Warm and dry without trophic changes noted.  Musculoskeletal: exam reveals right knee pain, low back pain.    Neurologically:  Mental status: The patient is awake, alert and oriented in all 4 spheres. His immediate and remote memory, attention, language skills and fund of knowledge are appropriate. There is no evidence of aphasia, agnosia, apraxia or anomia. Speech is clear with normal prosody and enunciation. Thought process is linear. Mood is normal and affect is normal.  Cranial nerves II - XII are as described above under HEENT exam. In addition: shoulder shrug is normal with equal shoulder height noted. Motor exam: Normal bulk, strength and tone is noted. There is no drift, tremor or rebound. Romberg is negative. Reflexes are 1+ throughout. Fine motor skills and coordination: intact with normal finger taps, normal hand movements, normal rapid alternating patting, normal foot taps and normal foot agility.  Cerebellar testing: No dysmetria or intention tremor on finger to nose testing. Heel to shin is difficult for him. There is no truncal or gait ataxia.  Sensory exam: intact to light touch in the upper and lower extremities.  Gait, station and balance: He  stands with difficulty. No veering to one side is noted. No leaning to one side is noted. Posture is age-appropriate and stance is narrow based. Gait shows normal stride length and normal pace. No problems turning are noted. Tandem walk is difficult for him.   Assessment and Plan:    In summary, CASPAR FAVILA is a very pleasant 51 y.o.-year old male  with an underlying medical history of pericarditis, allergic rhinitis, degenerative disc disease, depression, diabetes, ED, reflux disease, gout, hyperlipidemia, hypertension, restless leg syndrome, and obesity, whose history and physical exam are in keeping with obstructive sleep apnea (OSA). I had a long chat with the patient and his friend about my findings and the diagnosis of OSA, its prognosis and treatment options. We talked about medical treatments, surgical interventions and non-pharmacological approaches. I explained in particular the risks and ramifications of untreated moderate to severe OSA, especially with respect to developing cardiovascular disease down the Road, including congestive heart failure, difficult to treat hypertension, cardiac arrhythmias, or stroke. Even type 2 diabetes has, in part, been linked to untreated OSA. Symptoms of untreated OSA include daytime sleepiness, memory problems, mood irritability and mood disorder such as depression and anxiety, lack of energy, as well as recurrent headaches, especially morning headaches. We talked about smoking cessation and trying to maintain a healthy lifestyle in general, as well as the importance of weight control. I encouraged the patient to eat healthy, exercise daily and keep well hydrated, to keep a scheduled bedtime and wake time routine, to not skip any meals and eat healthy snacks in between meals. I advised the patient not to drive when feeling sleepy. I recommended the following at this time: sleep study with potential positive airway pressure titration. (We will  score hypopneas at  4%).   I explained the sleep test procedure to the patient and also outlined possible surgical and non-surgical treatment options of OSA, including the use of a custom-made dental device (which would require a referral to a specialist dentist or oral surgeon), upper airway surgical options, such as pillar implants, radiofrequency surgery, tongue base surgery, and UPPP (which would involve a referral to an ENT surgeon). Rarely, jaw surgery such as mandibular advancement may be considered.  I also explained the CPAP treatment option to the patient, who indicated that he would be willing to try CPAP if the need arises. I explained the importance of being compliant with PAP treatment, not only for insurance purposes but primarily to improve His symptoms, and for the patient's long term health benefit, including to reduce His cardiovascular risks. I answered all their questions today and the patient and [name] were in agreement. I would like to see him back after the sleep study is completed and encouraged him to call with any interim questions, concerns, problems or updates.   Thank you very much for allowing me to participate in the care of this nice patient. If I can be of any further assistance to you please do not hesitate to call me at [phone].  Sincerely,   Star Age, MD, PhD

## 2016-07-28 NOTE — Patient Instructions (Signed)

## 2016-08-02 ENCOUNTER — Encounter: Payer: Self-pay | Admitting: Family

## 2016-08-02 ENCOUNTER — Ambulatory Visit (INDEPENDENT_AMBULATORY_CARE_PROVIDER_SITE_OTHER): Payer: Medicare HMO | Admitting: Family

## 2016-08-02 VITALS — BP 148/88 | HR 68 | Temp 98.6°F | Resp 18 | Ht 71.0 in | Wt 268.0 lb

## 2016-08-02 DIAGNOSIS — B370 Candidal stomatitis: Secondary | ICD-10-CM | POA: Diagnosis not present

## 2016-08-02 DIAGNOSIS — J0140 Acute pansinusitis, unspecified: Secondary | ICD-10-CM | POA: Diagnosis not present

## 2016-08-02 MED ORDER — METHYLPREDNISOLONE ACETATE 80 MG/ML IJ SUSP
80.0000 mg | Freq: Once | INTRAMUSCULAR | Status: AC
  Administered 2016-08-02: 80 mg via INTRAMUSCULAR

## 2016-08-02 MED ORDER — NYSTATIN 100000 UNIT/ML MT SUSP: 5.0000 mL | Freq: Four times a day (QID) | OROMUCOSAL | 0 refills | Status: DC

## 2016-08-02 MED ORDER — AMOXICILLIN-POT CLAVULANATE 875-125 MG PO TABS: 1.0000 | ORAL_TABLET | Freq: Two times a day (BID) | ORAL | 0 refills | Status: DC

## 2016-08-02 NOTE — Assessment & Plan Note (Signed)
Symptoms and exam consistent with thrush. Start nystatin. He is on terbinafine orally daily for fungal infection of the toe. Follow-up if symptoms worsen or do not improve.

## 2016-08-02 NOTE — Assessment & Plan Note (Signed)
Symptoms and exam consistent with acute bacterial sinusitis. Start Augmentin. In office injection of 80 mg of Depo-Medrol provided without complication. Continue over-the-counter medications as needed for symptom relief and supportive care. Follow-up if symptoms worsen or do not improve.

## 2016-08-02 NOTE — Patient Instructions (Addendum)
Thank you for choosing [facility].  SUMMARY AND INSTRUCTIONS:   Please start taking the Augmentin for your sinus infection. Please complete all antibiotics prescribed.  Start the Nystatin for your mouth.   Continue medication as prescribed - may increase metformin for elevation in blood sugars.   Medication:  Your prescription(s) have been submitted to your pharmacy or been printed and provided for you. Please take as directed and contact our office if you believe you are having problem(s) with the medication(s) or have any questions.  Follow up:  If your symptoms worsen or fail to improve, please contact our office for further instruction, or in case of emergency go directly to the emergency room at the closest medical facility.    General Recommendations:    Please drink plenty of fluids.  Get plenty of rest   Sleep in humidified air  Use saline nasal sprays  Netti pot   OTC Medications:  Decongestants - helps relieve congestion   Flonase (generic fluticasone) or Nasacort (generic triamcinolone) - please make sure to use the "cross-over" technique at a 45 degree angle towards the opposite eye as opposed to straight up the nasal passageway.   Sudafed (generic pseudoephedrine - Note this is the one that is available behind the pharmacy counter); Products with phenylephrine (-PE) may also be used but is often not as effective as pseudoephedrine.   If you have HIGH BLOOD PRESSURE - Coricidin HBP; AVOID any product that is -D as this contains pseudoephedrine which may increase your blood pressure.  Afrin (oxymetazoline) every 6-8 hours for up to 3 days.   Allergies - helps relieve runny nose, itchy eyes and sneezing   Claritin (generic loratidine), Allegra (fexofenidine), or Zyrtec (generic cyrterizine) for runny nose. These medications should not cause drowsiness.  Note - Benadryl (generic diphenhydramine) may be used however may cause drowsiness  Cough  -   Delsym or Robitussin (generic dextromethorphan)  Expectorants - helps loosen mucus to ease removal   Mucinex (generic guaifenesin) as directed on the package.  Headaches / General Aches   Tylenol (generic acetaminophen) - DO NOT EXCEED 3 grams (3,000 mg) in a 24 hour time period  Advil/Motrin (generic ibuprofen)   Sore Throat -   Salt water gargle   Chloraseptic (generic benzocaine) spray or lozenges / Sucrets (generic dyclonine)    Sinusitis Sinusitis is redness, soreness, and inflammation of the paranasal sinuses. Paranasal sinuses are air pockets within the bones of your face (beneath the eyes, the middle of the forehead, or above the eyes). In healthy paranasal sinuses, mucus is able to drain out, and air is able to circulate through them by way of your nose. However, when your paranasal sinuses are inflamed, mucus and air can become trapped. This can allow bacteria and other germs to grow and cause infection. Sinusitis can develop quickly and last only a short time (acute) or continue over a long period (chronic). Sinusitis that lasts for more than 12 weeks is considered chronic.  CAUSES  Causes of sinusitis include:  Allergies.  Structural abnormalities, such as displacement of the cartilage that separates your nostrils (deviated septum), which can decrease the air flow through your nose and sinuses and affect sinus drainage.  Functional abnormalities, such as when the small hairs (cilia) that line your sinuses and help remove mucus do not work properly or are not present. SIGNS AND SYMPTOMS  Symptoms of acute and chronic sinusitis are the same. The primary symptoms are pain and pressure around the affected  sinuses. Other symptoms include:  Upper toothache.  Earache.  Headache.  Bad breath.  Decreased sense of smell and taste.  A cough, which worsens when you are lying flat.  Fatigue.  Fever.  Thick drainage from your nose, which often is green and may  contain pus (purulent).  Swelling and warmth over the affected sinuses. DIAGNOSIS  Your health care provider will perform a physical exam. During the exam, your health care provider may:  Look in your nose for signs of abnormal growths in your nostrils (nasal polyps).  Tap over the affected sinus to check for signs of infection.  View the inside of your sinuses (endoscopy) using an imaging device that has a light attached (endoscope). If your health care provider suspects that you have chronic sinusitis, one or more of the following tests may be recommended:  Allergy tests.  Nasal culture. A sample of mucus is taken from your nose, sent to a lab, and screened for bacteria.  Nasal cytology. A sample of mucus is taken from your nose and examined by your health care provider to determine if your sinusitis is related to an allergy. TREATMENT  Most cases of acute sinusitis are related to a viral infection and will resolve on their own within 10 days. Sometimes medicines are prescribed to help relieve symptoms (pain medicine, decongestants, nasal steroid sprays, or saline sprays).  However, for sinusitis related to a bacterial infection, your health care provider will prescribe antibiotic medicines. These are medicines that will help kill the bacteria causing the infection.  Rarely, sinusitis is caused by a fungal infection. In theses cases, your health care provider will prescribe antifungal medicine. For some cases of chronic sinusitis, surgery is needed. Generally, these are cases in which sinusitis recurs more than 3 times per year, despite other treatments. HOME CARE INSTRUCTIONS   Drink plenty of water. Water helps thin the mucus so your sinuses can drain more easily.  Use a humidifier.  Inhale steam 3 to 4 times a day (for example, sit in the bathroom with the shower running).  Apply a warm, moist washcloth to your face 3 to 4 times a day, or as directed by your health care  provider.  Use saline nasal sprays to help moisten and clean your sinuses.  Take medicines only as directed by your health care provider.  If you were prescribed either an antibiotic or antifungal medicine, finish it all even if you start to feel better. SEEK IMMEDIATE MEDICAL CARE IF:  You have increasing pain or severe headaches.  You have nausea, vomiting, or drowsiness.  You have swelling around your face.  You have vision problems.  You have a stiff neck.  You have difficulty breathing. MAKE SURE YOU:   Understand these instructions.  Will watch your condition.  Will get help right away if you are not doing well or get worse. Document Released: 03/14/2005 Document Revised: 07/29/2013 Document Reviewed: 03/29/2011 [facility] Patient Information 2015 [place], [place]. This information is not intended to replace advice given to you by your health care provider. Make sure you discuss any questions you have with your health care provider.

## 2016-08-02 NOTE — Progress Notes (Signed)
Subjective:    Patient ID: Harold [name], male    DOB: November 04, 1965, 51 y.o.   MRN: [id number]  Chief Complaint  Patient presents with  . Cough    cough, loss of voice, sinus pressure and pain, x3 weeks    HPI:  Harold [name] is a 51 y.o. male who  has a past medical history of Acute pericarditis; Allergic rhinitis, cause unspecified (09/14/2012); Anxiety; Chronic back pain; DDD (degenerative disc disease), lumbar (09/14/2012); Depression; Diabetes mellitus without complication ([place]); Erectile dysfunction (09/14/2012); GERD (gastroesophageal reflux disease); Gout; Headache; History of pancreatitis; Hyperlipidemia (07/11/2013); Hypertension; Hypertriglyceridemia; Left sided sciatica (09/14/2012); Metabolic syndrome; Obesity; OSA (obstructive sleep apnea); and Restless legs. and presents today for an acute office visit.   This is a new problem. Associated symptoms of cough, congestion, loss of voice and sinus pressure/pain have been going on for about 3 weeks. Denies fevers. Has completed a course of Augmentin in February for a sinus infection. Course of the symptoms have worsened since initial onset. Has a white mucus and coating around his mouth.  Allergies  Allergen Reactions  . Diphenhydramine Itching, Palpitations and Other (See Comments)    "jittery"  . Other Itching and Swelling    Pecans: itching and swelling of the tongue   . Colchicine Diarrhea  . Lipitor [Atorvastatin] Other (See Comments)    Muscle cramps  . Septra [Sulfamethoxazole-Trimethoprim] Itching      Outpatient Medications Prior to Visit  Medication Sig Dispense Refill  . amLODipine (NORVASC) 10 MG tablet TAKE ONE TABLET BY MOUTH DAILY 90 tablet 1  . ammonium lactate (AMLACTIN) 12 % lotion Apply 1 application topically as needed for dry skin. 400 g 3  . Blood Glucose Monitoring Suppl (ACCU-CHEK NANO SMARTVIEW) W/DEVICE KIT Use glucose meter to check blood sugar 1-3 times daily as directed. 1 kit 0  . cetirizine  (ZYRTEC) 5 MG tablet Take 5 mg by mouth daily.    . citalopram (CELEXA) 40 MG tablet Take 1 tablet (40 mg total) by mouth daily. Yearly physical w/labs due in May must see MD for refills 90 tablet 0  . fenofibrate (TRICOR) 145 MG tablet TAKE ONE TABLET BY MOUTH DAILY. 90 tablet 0  . fentaNYL (DURAGESIC - DOSED MCG/HR) 75 MCG/HR Place 50 mcg onto the skin every 3 (three) days.     . furosemide (LASIX) 40 MG tablet TAKE ONE TABLET BY MOUTH DAILY 90 tablet 0  . gabapentin (NEURONTIN) 100 MG capsule Take 3 capsules (300 mg total) by mouth 3 (three) times daily. 90 capsule 3  . glucose blood (ACCU-CHEK SMARTVIEW) test strip Use as instructed 100 each 12  . ibuprofen (ADVIL,MOTRIN) 800 MG tablet Take 800 mg by mouth every 8 (eight) hours as needed.    [name] lisinopril (PRINIVIL,ZESTRIL) 20 MG tablet Take 1 tablet (20 mg total) by mouth 2 (two) times daily. 180 tablet 2  . metFORMIN (GLUCOPHAGE) 500 MG tablet TAKE ONE TABLET BY MOUTH TWICE A DAY WITH A MEAL 60 tablet 0  . oxyCODONE (OXY IR/ROXICODONE) 5 MG immediate release tablet Take 1-2 tablets (5-10 mg total) by mouth every 3 (three) hours as needed for breakthrough pain. 90 tablet 0  . oxyCODONE-acetaminophen (PERCOCET) 10-325 MG tablet Take 1 tablet by mouth every 6 (six) hours as needed for pain. 90 tablet 0  . Potassium Chloride ER 20 MEQ TBCR TAKE ONE TABLET BY MOUTH DAILY 90 tablet 0  . rosuvastatin (CRESTOR) 20 MG tablet Take 1 tablet (20 mg total)  by mouth daily. Yearly physical w/labs due in May must see MD for refills 90 tablet 0  . sildenafil (REVATIO) 20 MG tablet Take 1-5 tablets (20-100 mg total) by mouth daily as needed. 50 tablet 2  . terbinafine (LAMISIL AT) 1 % cream Apply 1 application topically 2 (two) times daily. 36 g 3  . terbinafine (LAMISIL) 250 MG tablet Take 1 tablet (250 mg total) by mouth daily. 90 tablet 0  . VOLTAREN 1 % GEL Apply 2 g topically 4 (four) times daily. 100 g 5   No facility-administered medications prior to  visit.       Past Surgical History:  Procedure Laterality Date  . COLONOSCOPY    . KNEE SURGERY     right  . LUMBAR LAMINECTOMY/DECOMPRESSION MICRODISCECTOMY N/A 08/26/2015   Procedure: Lumbar Two-Sacral One  Laminectomy for decompression;  Surgeon: [name], MD;  Location: [facility];  Service: Neurosurgery;  Laterality: N/A;  . pericardectomy  07/15/2010   Dr. [name]  . pleurx catheter placement  07/15/2010   Dr. [name]  . SHOULDER SURGERY     right and left shoulders  . TONSILLECTOMY AND ADENOIDECTOMY     one tonsil      Past Medical History:  Diagnosis Date  . Acute pericarditis    admx 03-26-10 thru 03-29-10; a. echo 03-26-10: EF 55-60%; mild LVH; trivial MR; RVF ok; mild -mod circumferential Eff w/o tamponade  . Allergic rhinitis, cause unspecified 09/14/2012  . Anxiety   . Chronic back pain   . DDD (degenerative disc disease), lumbar 09/14/2012  . Depression   . Diabetes mellitus without complication ([place])   . Erectile dysfunction 09/14/2012  . GERD (gastroesophageal reflux disease)   . Gout    pt denies  . Headache    migraines in the past  . History of pancreatitis    a. admx 04-2009.Marland KitchenMarland Kitchen? 2-2 triglycerides  . Hyperlipidemia 07/11/2013  . Hypertension   . Hypertriglyceridemia    a. followed by [facility]  . Left sided sciatica 09/14/2012  . Metabolic syndrome   . Obesity   . OSA (obstructive sleep apnea)    does not use cpap  . Restless legs       Review of Systems  Constitutional: Negative for chills and fever.  HENT: Positive for congestion, ear pain, sinus pain, sinus pressure and sore throat. Negative for sneezing.   Respiratory: Positive for cough. Negative for chest tightness and shortness of breath.   Neurological: Negative for headaches.      Objective:    BP (!) 148/88 (BP Location: Left Arm, Patient Position: Sitting, Cuff Size: Large)   Pulse 68   Temp 98.6 F (37 C) (Oral)   Resp 18   Ht '5\' 11"'  (1.803 m)   Wt 268 lb  (121.6 kg)   SpO2 97%   BMI 37.38 kg/m  Nursing note and vital signs reviewed.  Physical Exam  Constitutional: He is oriented to person, place, and time. He appears well-developed and well-nourished.  HENT:  Right Ear: Hearing, tympanic membrane, external ear and ear canal normal.  Left Ear: Hearing, tympanic membrane, external ear and ear canal normal.  Nose: Right sinus exhibits maxillary sinus tenderness and frontal sinus tenderness. Left sinus exhibits maxillary sinus tenderness and frontal sinus tenderness.  Mouth/Throat: Uvula is midline, oropharynx is clear and moist and mucous membranes are normal.  Neck: Neck supple.  Cardiovascular: Normal rate, regular rhythm, normal heart sounds and intact distal pulses.   Pulmonary/Chest:  Effort normal and breath sounds normal.  Neurological: He is alert and oriented to person, place, and time.  Skin: Skin is warm and dry.       Assessment & Plan:   Problem List Items Addressed This Visit      Respiratory   Sinusitis, acute - Primary    Symptoms and exam consistent with acute bacterial sinusitis. Start Augmentin. In office injection of 80 mg of Depo-Medrol provided without complication. Continue over-the-counter medications as needed for symptom relief and supportive care. Follow-up if symptoms worsen or do not improve.      Relevant Medications   amoxicillin-clavulanate (AUGMENTIN) 875-125 MG tablet   methylPREDNISolone acetate (DEPO-MEDROL) injection 80 mg (Completed)     Digestive   Thrush    Symptoms and exam consistent with thrush. Start nystatin. He is on terbinafine orally daily for fungal infection of the toe. Follow-up if symptoms worsen or do not improve.      Relevant Medications   nystatin (MYCOSTATIN) 100000 UNIT/ML suspension       I am having Mr. Tamblyn start on nystatin and amoxicillin-clavulanate. I am also having him maintain his VOLTAREN, ACCU-CHEK NANO SMARTVIEW, glucose blood, cetirizine, gabapentin,  oxyCODONE, oxyCODONE-acetaminophen, ibuprofen, fentaNYL, furosemide, fenofibrate, ammonium lactate, terbinafine, rosuvastatin, citalopram, amLODipine, terbinafine, lisinopril, metFORMIN, Potassium Chloride ER, and sildenafil. We administered methylPREDNISolone acetate.   Meds ordered this encounter  Medications  . nystatin (MYCOSTATIN) 100000 UNIT/ML suspension    Sig: Take 5 mLs (500,000 Units total) by mouth 4 (four) times daily.    Dispense:  60 mL    Refill:  0    Order Specific Question:   Supervising Provider    Answer:   Pricilla Holm A [[id number]]  . amoxicillin-clavulanate (AUGMENTIN) 875-125 MG tablet    Sig: Take 1 tablet by mouth 2 (two) times daily.    Dispense:  14 tablet    Refill:  0    Order Specific Question:   Supervising Provider    Answer:   Pricilla Holm A [[id number]]  . methylPREDNISolone acetate (DEPO-MEDROL) injection 80 mg     Follow-up: Return if symptoms worsen or fail to improve.  Mauricio Po, FNP

## 2016-08-04 NOTE — Progress Notes (Signed)
Cardiology Office Note   Date:  08/07/2016   ID:  Harold [name], DOB 09/06/1965, MRN [id number]  PCP:  Golden Circle, FNP  Cardiologist:   Minus Breeding, MD   Chief Complaint  Patient presents with  . Chest Pain      History of Present Illness: Harold [name] is a 51 y.o. male who presents for evaluation of multiple cardiovascular risk factors. I saw him in the past for pericarditis.    Last year I sent him for a POET (Plain Old Exercise Treadmill) after the last visit.  He had no ischemia but did have a hypertensive response.  He returns for follow up.  He is doing relatively well.  He had back surgery in 2017 and had relief of leg pain.  He is doing some swimming.  The patient denies any new symptoms such as chest discomfort, neck or arm discomfort. There has been no new shortness of breath, PND or orthopnea. There have been no reported palpitations, presyncope or syncope.  He is waiting on a call back form the sleep doctors as he needs a new CPAP and sleep test.    Past Medical History:  Diagnosis Date  . Acute pericarditis    admx 03-26-10 thru 03-29-10; a. echo 03-26-10: EF 55-60%; mild LVH; trivial MR; RVF ok; mild -mod circumferential Eff w/o tamponade  . Allergic rhinitis, cause unspecified 09/14/2012  . Anxiety   . Chronic back pain   . DDD (degenerative disc disease), lumbar 09/14/2012  . Depression   . Diabetes mellitus without complication ([place])   . Erectile dysfunction 09/14/2012  . GERD (gastroesophageal reflux disease)   . Gout    pt denies  . Headache    migraines in the past  . History of pancreatitis    a. admx 04-2009.Marland KitchenMarland Kitchen? 2-2 triglycerides  . Hyperlipidemia 07/11/2013  . Hypertension   . Hypertriglyceridemia    a. followed by [facility]  . Left sided sciatica 09/14/2012  . Metabolic syndrome   . Obesity   . OSA (obstructive sleep apnea)    does not use cpap  . Restless legs     Past Surgical History:  Procedure Laterality Date  .  COLONOSCOPY    . KNEE SURGERY     right  . LUMBAR LAMINECTOMY/DECOMPRESSION MICRODISCECTOMY N/A 08/26/2015   Procedure: Lumbar Two-Sacral One  Laminectomy for decompression;  Surgeon: [name], MD;  Location: [facility];  Service: Neurosurgery;  Laterality: N/A;  . pericardectomy  07/15/2010   Dr. [name]  . pleurx catheter placement  07/15/2010   Dr. [name]  . SHOULDER SURGERY     right and left shoulders  . TONSILLECTOMY AND ADENOIDECTOMY     one tonsil     Current Outpatient Prescriptions  Medication Sig Dispense Refill  . amLODipine (NORVASC) 10 MG tablet TAKE ONE TABLET BY MOUTH DAILY 90 tablet 1  . ammonium lactate (AMLACTIN) 12 % lotion Apply 1 application topically as needed for dry skin. 400 g 3  . amoxicillin-clavulanate (AUGMENTIN) 875-125 MG tablet Take 1 tablet by mouth 2 (two) times daily. 14 tablet 0  . Blood Glucose Monitoring Suppl (ACCU-CHEK NANO SMARTVIEW) W/DEVICE KIT Use glucose meter to check blood sugar 1-3 times daily as directed. 1 kit 0  . cetirizine (ZYRTEC) 5 MG tablet Take 5 mg by mouth daily.    . citalopram (CELEXA) 40 MG tablet Take 1 tablet (40 mg total) by mouth daily. Yearly physical w/labs due in May  must see MD for refills 90 tablet 0  . fenofibrate (TRICOR) 145 MG tablet TAKE ONE TABLET BY MOUTH DAILY. 90 tablet 0  . fentaNYL (DURAGESIC - DOSED MCG/HR) 75 MCG/HR Place 50 mcg onto the skin every 3 (three) days.     . furosemide (LASIX) 40 MG tablet TAKE ONE TABLET BY MOUTH DAILY 90 tablet 0  . gabapentin (NEURONTIN) 100 MG capsule Take 3 capsules (300 mg total) by mouth 3 (three) times daily. 90 capsule 3  . glucose blood (ACCU-CHEK SMARTVIEW) test strip Use as instructed 100 each 12  . ibuprofen (ADVIL,MOTRIN) 800 MG tablet Take 800 mg by mouth every 8 (eight) hours as needed.    [name] lisinopril (PRINIVIL,ZESTRIL) 20 MG tablet Take 1 tablet (20 mg total) by mouth 2 (two) times daily. 180 tablet 2  . metFORMIN (GLUCOPHAGE) 500 MG tablet  TAKE ONE TABLET BY MOUTH TWICE A DAY WITH A MEAL 60 tablet 0  . nystatin (MYCOSTATIN) 100000 UNIT/ML suspension Take 5 mLs (500,000 Units total) by mouth 4 (four) times daily. 60 mL 0  . oxyCODONE (OXY IR/ROXICODONE) 5 MG immediate release tablet Take 1-2 tablets (5-10 mg total) by mouth every 3 (three) hours as needed for breakthrough pain. 90 tablet 0  . oxyCODONE-acetaminophen (PERCOCET) 10-325 MG tablet Take 1 tablet by mouth every 6 (six) hours as needed for pain. 90 tablet 0  . Potassium Chloride ER 20 MEQ TBCR TAKE ONE TABLET BY MOUTH DAILY 90 tablet 0  . rosuvastatin (CRESTOR) 20 MG tablet Take 1 tablet (20 mg total) by mouth daily. Yearly physical w/labs due in May must see MD for refills 90 tablet 0  . sildenafil (REVATIO) 20 MG tablet Take 1-5 tablets (20-100 mg total) by mouth daily as needed. 50 tablet 2  . terbinafine (LAMISIL AT) 1 % cream Apply 1 application topically 2 (two) times daily. 36 g 3  . terbinafine (LAMISIL) 250 MG tablet Take 1 tablet (250 mg total) by mouth daily. 90 tablet 0  . VOLTAREN 1 % GEL Apply 2 g topically 4 (four) times daily. 100 g 5   No current facility-administered medications for this visit.     Allergies:   Diphenhydramine; Other; Colchicine; Lipitor [atorvastatin]; and Septra [sulfamethoxazole-trimethoprim]    ROS:   As stated in the HPI and negative for all other systems.   PHYSICAL EXAM: VS:  BP 132/78   Pulse 68   Ht _0  (1.803 m)   Wt 268 lb (121.6 kg)   BMI 37.38 kg/m  , BMI Body mass index is 37.38 kg/m. GENERAL:  Well appearing HEENT:  Pupils equal round and reactive, fundi not visualized, oral mucosa unremarkable NECK:  No jugular venous distention, waveform within normal limits, carotid upstroke brisk and symmetric, no bruits, no thyromegaly LUNGS:  Clear to auscultation bilaterally CHEST:  Unremarkable HEART:  PMI not displaced or sustained,S1 and S2 within normal limits, no S3, no S4, no clicks, no rubs, no murmurs ABD:   Flat, positive bowel sounds normal in frequency in pitch, no bruits, no rebound, no guarding, no midline pulsatile mass, no hepatomegaly, no splenomegaly, obese EXT:  2 plus pulses throughout, no edema, no cyanosis no clubbing   EKG:  EKG is  ordered today. Sinus rhythm, right axis deviation, rate 68, poor anterior R wave progression, no acute ST-T wave changes.    Recent Labs: 08/25/2015: Hemoglobin 15.0; Platelets 381 06/17/2016: ALT 19 07/04/2016: BUN 13; Creatinine, Ser 0.95; Potassium 3.9; Sodium 134   Lab Results  Component Value Date   CHOL 146 11/19/2013   TRIG 145.0 11/19/2013   HDL 37.80 (L) 11/19/2013   LDLCALC 79 11/19/2013   LDLDIRECT 124.5 09/14/2012     Lab Results  Component Value Date   HGBA1C 7.6 (H) 07/04/2016    Wt Readings from Last 3 Encounters:  08/05/16 268 lb (121.6 kg)  08/02/16 268 lb (121.6 kg)  07/28/16 266 lb (120.7 kg)      Other studies Reviewed: Additional studies/ records that were reviewed today include: None Review of the above records demonstrates:     ASSESSMENT AND PLAN:    OBESITY:   The patient understands the need to lose weight with diet and exercise.  Hopefully this will come down with exercise.  DM:   His A1c was 7.5 recently.  This is on the rise and we talked about the importance of BS control.  I will defer to Golden Circle, FNP  RISK REDUCTION:   We talked about strategies for stopping smoking.  He is a stress smoker.  He needs a lipid profile.   HTN:  BP is good today.  No change in therapy is planned  SLEEP APNEA:  He is now being followed in the [facility] and is awaiting a call back.    Current medicines are reviewed at length with the patient today.  The patient does not have concerns regarding medicines.  The following changes have been made:  no change  Labs/ tests ordered today include:    Orders Placed This Encounter  Procedures  . EKG 12-Lead     Disposition:   FU with me in 12 months.     Signed, Minus Breeding, MD  08/07/2016 7:35 PM    [facility]

## 2016-08-05 ENCOUNTER — Encounter: Payer: Self-pay | Admitting: Cardiology

## 2016-08-05 ENCOUNTER — Ambulatory Visit (INDEPENDENT_AMBULATORY_CARE_PROVIDER_SITE_OTHER): Payer: Medicare HMO | Admitting: Cardiology

## 2016-08-05 VITALS — BP 132/78 | HR 68 | Ht 71.0 in | Wt 268.0 lb

## 2016-08-05 DIAGNOSIS — Z6837 Body mass index (BMI) 37.0-37.9, adult: Secondary | ICD-10-CM

## 2016-08-05 DIAGNOSIS — I10 Essential (primary) hypertension: Secondary | ICD-10-CM | POA: Diagnosis not present

## 2016-08-05 DIAGNOSIS — E785 Hyperlipidemia, unspecified: Secondary | ICD-10-CM

## 2016-08-05 DIAGNOSIS — E669 Obesity, unspecified: Secondary | ICD-10-CM

## 2016-08-05 DIAGNOSIS — IMO0001 Reserved for inherently not codable concepts without codable children: Secondary | ICD-10-CM

## 2016-08-05 NOTE — Patient Instructions (Signed)

## 2016-08-08 ENCOUNTER — Other Ambulatory Visit: Payer: Self-pay | Admitting: Family

## 2016-08-08 DIAGNOSIS — I10 Essential (primary) hypertension: Secondary | ICD-10-CM

## 2016-08-10 ENCOUNTER — Other Ambulatory Visit: Payer: Self-pay | Admitting: Family

## 2016-08-15 ENCOUNTER — Other Ambulatory Visit: Payer: Self-pay | Admitting: Internal Medicine

## 2016-08-15 ENCOUNTER — Telehealth: Payer: Self-pay | Admitting: *Deleted

## 2016-08-15 DIAGNOSIS — M47817 Spondylosis without myelopathy or radiculopathy, lumbosacral region: Secondary | ICD-10-CM | POA: Diagnosis not present

## 2016-08-15 DIAGNOSIS — D3A026 Benign carcinoid tumor of the rectum: Secondary | ICD-10-CM

## 2016-08-15 DIAGNOSIS — L83 Acanthosis nigricans: Secondary | ICD-10-CM | POA: Diagnosis not present

## 2016-08-15 DIAGNOSIS — L819 Disorder of pigmentation, unspecified: Secondary | ICD-10-CM | POA: Diagnosis not present

## 2016-08-15 DIAGNOSIS — Z800 Family history of malignant neoplasm of digestive organs: Secondary | ICD-10-CM

## 2016-08-15 NOTE — Telephone Encounter (Signed)
Dr Hilarie Fredrickson has reviewed patient's recall for colonoscopy and has determined that due to history of carcinoid tumor of rectal mucosa in 2005 and family history of colon cancer, patient is due for repeat colonoscopy at this time. Per chart, patient is on oxygen. However, patient states that he is no longer on oxygen but is in the process of being evaluated to see if he needs to start back on oxygen. Due to this, we will have him complete procedure at the hospital. Patient has been scheduled for colonoscopy at [facility] on 10/25/16 @ 9:30 am. Scheduled with Maudie Mercury at [facility], case [id number]. Patient is also scheduled for previsit 10/03/16 @ 4 pm. He verbalizes understanding of the information above.

## 2016-08-24 ENCOUNTER — Telehealth: Payer: Self-pay | Admitting: Neurology

## 2016-08-24 DIAGNOSIS — G4733 Obstructive sleep apnea (adult) (pediatric): Secondary | ICD-10-CM

## 2016-08-24 NOTE — Telephone Encounter (Signed)
Needs 30 day autoPAP trial, please schedule FU accordingly.

## 2016-08-24 NOTE — Telephone Encounter (Signed)
[employer] denied Split and HST and ordered autopap for 30 day trial because patient has already has OSA diagnoses.

## 2016-08-25 NOTE — Telephone Encounter (Signed)
I spoke to patient and gave him information below. He voiced understanding. I sent orders to [facility] since he is a client there. 30 day down load requested.

## 2016-09-06 ENCOUNTER — Encounter: Payer: Self-pay | Admitting: Sports Medicine

## 2016-09-06 ENCOUNTER — Ambulatory Visit (INDEPENDENT_AMBULATORY_CARE_PROVIDER_SITE_OTHER): Payer: Medicare HMO | Admitting: Sports Medicine

## 2016-09-06 DIAGNOSIS — B351 Tinea unguium: Secondary | ICD-10-CM

## 2016-09-06 DIAGNOSIS — L853 Xerosis cutis: Secondary | ICD-10-CM

## 2016-09-06 DIAGNOSIS — B359 Dermatophytosis, unspecified: Secondary | ICD-10-CM

## 2016-09-06 DIAGNOSIS — E119 Type 2 diabetes mellitus without complications: Secondary | ICD-10-CM

## 2016-09-06 NOTE — Progress Notes (Signed)
Subjective: Harold [name] is a 51 y.o. male patient with history of diabetes who presents to office today complaining of long, painful nails  while ambulating in shoes and dry skin to both feet, that's better. Patient states that the glucose reading this morning was not recorded but last a1c 5.8. Patient denies any new changes in medication or new problems.   Patient Active Problem List   Diagnosis Date Noted  . Thrush 08/02/2016  . Sleep disturbance 07/04/2016  . 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  . Urinary frequency 07/24/2015  . Trichomonas exposure 07/24/2015  . Type 2 diabetes mellitus ([place]) 03/04/2015  . Verruca vulgaris 02/24/2015  . Nocturia 02/17/2015  . Accessory skin tags 02/17/2015  . Sinusitis, acute 06/04/2014  . Lumbar radiculopathy, acute 05/23/2014  . Acute left-sided back pain with sciatica 04/10/2014  . Smoker 04/10/2014  . Rash 02/28/2014  . Cough 01/01/2014  . Grief reaction 11/19/2013  . Primary localized osteoarthrosis, lower leg 09/04/2013  . Chronic pain syndrome 07/11/2013  . Hyperlipidemia 07/11/2013  . 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 Gonalgia 11/29/2012  . Preventative health care 09/14/2012  . Allergic rhinitis 09/14/2012  . DDD (degenerative disc disease), lumbar 09/14/2012  . Left sided sciatica 09/14/2012  . Erectile dysfunction 09/14/2012  . Pre-ulcerative corn or callous 09/14/2012  . Hemorrhoids 07/26/2011  . Chronic back pain greater than 3 months duration 09/23/2010  . Abnormal EKG 08/12/2010  . OSA (obstructive sleep apnea) 08/09/2010  . 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 CHF (congestive heart failure) ([place]) 06/29/2010  . Edema 06/10/2010  . Acute idiopathic pericarditis 04/08/2010  . NONSPECIFIC ABNORMAL ELECTROCARDIOGRAM 04/08/2010  . HYPERTRIGLYCERIDEMIA 05/26/2009  . SHOULDER PAIN, LEFT 04/21/2008  . DEPRESSION 03/11/2008  . Essential hypertension 03/11/2008  . Gout, unspecified 05/01/2007  . METABOLIC SYNDROME X  66/29/4765  . OBESITY NOS 10/24/2006   Current Outpatient Prescriptions on File Prior to Visit  Medication Sig Dispense Refill  . amLODipine (NORVASC) 10 MG tablet TAKE ONE TABLET BY MOUTH DAILY 90 tablet 1  . ammonium lactate (AMLACTIN) 12 % lotion Apply 1 application topically as needed for dry skin. 400 g 3  . Blood Glucose Monitoring Suppl (ACCU-CHEK NANO SMARTVIEW) W/DEVICE KIT Use glucose meter to check blood sugar 1-3 times daily as directed. 1 kit 0  . cetirizine (ZYRTEC) 5 MG tablet Take 5 mg by mouth daily.    . citalopram (CELEXA) 40 MG tablet Take 1 tablet (40 mg total) by mouth daily. Yearly physical w/labs due in May must see MD for refills 90 tablet 0  . fenofibrate (TRICOR) 145 MG tablet TAKE ONE TABLET BY MOUTH DAILY. 90 tablet 0  . fentaNYL (DURAGESIC - DOSED MCG/HR) 75 MCG/HR Place 50 mcg onto the skin every 3 (three) days.     . furosemide (LASIX) 40 MG tablet TAKE ONE TABLET BY MOUTH DAILY 90 tablet 0  . gabapentin (NEURONTIN) 100 MG capsule Take 3 capsules (300 mg total) by mouth 3 (three) times daily. 90 capsule 3  . glucose blood (ACCU-CHEK SMARTVIEW) test strip Use as instructed 100 each 12  . ibuprofen (ADVIL,MOTRIN) 800 MG tablet Take 800 mg by mouth every 8 (eight) hours as needed.    [name] lisinopril (PRINIVIL,ZESTRIL) 20 MG tablet Take 1 tablet (20 mg total) by mouth 2 (two) times daily. 180 tablet 2  . metFORMIN (GLUCOPHAGE) 500 MG tablet TAKE ONE TABLET BY MOUTH TWICE A DAY WITH A MEAL 60 tablet 0  . nystatin (MYCOSTATIN) 100000 UNIT/ML suspension Take 5 mLs (500,000  Units total) by mouth 4 (four) times daily. 60 mL 0  . oxyCODONE (OXY IR/ROXICODONE) 5 MG immediate release tablet Take 1-2 tablets (5-10 mg total) by mouth every 3 (three) hours as needed for breakthrough pain. 90 tablet 0  . oxyCODONE-acetaminophen (PERCOCET) 10-325 MG tablet Take 1 tablet by mouth every 6 (six) hours as needed for pain. 90 tablet 0  . Potassium Chloride ER 20 MEQ TBCR TAKE ONE TABLET  BY MOUTH DAILY 90 tablet 0  . rosuvastatin (CRESTOR) 20 MG tablet Take 1 tablet (20 mg total) by mouth daily. Yearly physical w/labs due in May must see MD for refills 90 tablet 0  . sildenafil (REVATIO) 20 MG tablet Take 1-5 tablets (20-100 mg total) by mouth daily as needed. 50 tablet 2  . terbinafine (LAMISIL AT) 1 % cream Apply 1 application topically 2 (two) times daily. 36 g 3  . VOLTAREN 1 % GEL Apply 2 g topically 4 (four) times daily. 100 g 5  . [DISCONTINUED] amoxicillin-clarithromycin-lansoprazole (PREVPAC) combo pack Take by mouth 2 (two) times daily. Follow package directions. 1 kit 0   No current facility-administered medications on file prior to visit.    Allergies  Allergen Reactions  . Diphenhydramine Itching, Palpitations and Other (See Comments)    "jittery"  . Other Itching and Swelling    Pecans: itching and swelling of the tongue   . Colchicine Diarrhea  . Lipitor [Atorvastatin] Other (See Comments)    Muscle cramps  . Septra [Sulfamethoxazole-Trimethoprim] Itching    Recent Results (from the past 2160 hour(s))  Hepatic function panel     Status: None   Collection Time: 06/17/16  4:00 PM  Result Value Ref Range   Total Bilirubin 0.3 0.2 - 1.2 mg/dL   Bilirubin, Direct 0.1 0.0 - 0.3 mg/dL   Alkaline Phosphatase 88 39 - 117 U/L   AST 22 0 - 37 U/L   ALT 19 0 - 53 U/L   Total Protein 6.6 6.0 - 8.3 g/dL   Albumin 4.2 3.5 - 5.2 g/dL  Basic Metabolic Panel (BMET)     Status: Abnormal   Collection Time: 07/04/16  3:07 PM  Result Value Ref Range   Sodium 134 (L) 135 - 145 mEq/L   Potassium 3.9 3.5 - 5.1 mEq/L   Chloride 98 96 - 112 mEq/L   CO2 26 19 - 32 mEq/L   Glucose, Bld 176 (H) 70 - 99 mg/dL   BUN 13 6 - 23 mg/dL   Creatinine, Ser 0.95 0.40 - 1.50 mg/dL   Calcium 9.3 8.4 - 10.5 mg/dL   GFR 108.39 >60.00 mL/min  Hemoglobin A1c     Status: Abnormal   Collection Time: 07/04/16  3:07 PM  Result Value Ref Range   Hgb A1c MFr Bld 7.6 (H) 4.6 - 6.5 %     Comment: Glycemic Control Guidelines for People with Diabetes:Non Diabetic:  <6%Goal of Therapy: <7%Additional Action Suggested:  >8%   Urine Microalbumin w/creat. ratio     Status: None   Collection Time: 07/04/16  3:07 PM  Result Value Ref Range   Microalb, Ur 1.6 0.0 - 1.9 mg/dL   Creatinine,U 139.5 mg/dL   Microalb Creat Ratio 1.1 0.0 - 30.0 mg/g    Objective: General: Patient is awake, alert, and oriented x 3 and in no acute distress.  Integument: Skin is warm, dry and supple bilateral. Nails are tender, long, thickened and dystrophic with subungual debris, consistent with onychomycosis, 1-5 bilateral. Scaly skin in annular  fashion suggestive of tinea resolved with superimposed dry skin that is much improved. No acute signs of infection. No open lesions or preulcerative lesions present bilateral. Remaining integument unremarkable.  Vasculature:  Dorsalis Pedis pulse 2/4 bilateral. Posterior Tibial pulse  2/4 bilateral. Capillary fill time <3 sec 1-5 bilateral. Positive hair growth to the level of the digits.Temperature gradient within normal limits. No varicosities present bilateral. No edema present bilateral.   Neurology: The patient has intact sensation measured with a 5.07/10g Semmes Weinstein Monofilament at all pedal sites bilateral . Vibratory sensation intact bilateral with tuning fork. No Babinski sign present bilateral.   Musculoskeletal: No symptomatic pedal deformities noted bilateral. Muscular strength 5/5 in all lower extremity muscular groups bilateral without pain on range of motion. No tenderness with calf compression bilateral.  Assessment and Plan: Problem List Items Addressed This Visit    None    Visit Diagnoses    Dermatophytosis of nail    -  Primary   Dry skin       Tinea       Diabetes mellitus without complication ([place])         -Examined patient. -Discussed and educated patient on diabetic foot care, especially with regards to the vascular, neurological  and musculoskeletal systems.  -Stressed the importance of good glycemic control and the detriment of not controlling glucose levels in relation to the foot. -Mechanically debrided all nails 1-5 bilateral using sterile nail nipper and filed with dremel without incident  -Cont with Lamisil cream for tinea until completed  -Cont with Amlactin lotion for dry skin  -Answered all patient questions -Patient to return  in 3 months for at risk foot care -Patient advised to call the office if any problems or questions arise in the meantime.  Landis Martins, DPM

## 2016-09-13 ENCOUNTER — Other Ambulatory Visit: Payer: Self-pay | Admitting: Internal Medicine

## 2016-09-14 DIAGNOSIS — M47818 Spondylosis without myelopathy or radiculopathy, sacral and sacrococcygeal region: Secondary | ICD-10-CM | POA: Diagnosis not present

## 2016-09-14 DIAGNOSIS — M47817 Spondylosis without myelopathy or radiculopathy, lumbosacral region: Secondary | ICD-10-CM | POA: Diagnosis not present

## 2016-09-20 ENCOUNTER — Other Ambulatory Visit: Payer: Self-pay | Admitting: Internal Medicine

## 2016-09-20 ENCOUNTER — Encounter (HOSPITAL_BASED_OUTPATIENT_CLINIC_OR_DEPARTMENT_OTHER): Payer: Self-pay | Admitting: *Deleted

## 2016-09-20 ENCOUNTER — Other Ambulatory Visit: Payer: Self-pay | Admitting: Family

## 2016-09-20 ENCOUNTER — Emergency Department (HOSPITAL_BASED_OUTPATIENT_CLINIC_OR_DEPARTMENT_OTHER)
Admission: EM | Admit: 2016-09-20 | Discharge: 2016-09-20 | Disposition: A | Discharge status: Home / Self Care | Payer: Medicare HMO | Attending: Emergency Medicine | Admitting: Emergency Medicine

## 2016-09-20 DIAGNOSIS — E119 Type 2 diabetes mellitus without complications: Secondary | ICD-10-CM | POA: Diagnosis not present

## 2016-09-20 DIAGNOSIS — F17210 Nicotine dependence, cigarettes, uncomplicated: Secondary | ICD-10-CM | POA: Diagnosis not present

## 2016-09-20 DIAGNOSIS — Z79899 Other long term (current) drug therapy: Secondary | ICD-10-CM | POA: Insufficient documentation

## 2016-09-20 DIAGNOSIS — Z7984 Long term (current) use of oral hypoglycemic drugs: Secondary | ICD-10-CM | POA: Insufficient documentation

## 2016-09-20 DIAGNOSIS — K0889 Other specified disorders of teeth and supporting structures: Secondary | ICD-10-CM | POA: Diagnosis not present

## 2016-09-20 DIAGNOSIS — I110 Hypertensive heart disease with heart failure: Secondary | ICD-10-CM | POA: Diagnosis not present

## 2016-09-20 DIAGNOSIS — Z79891 Long term (current) use of opiate analgesic: Secondary | ICD-10-CM | POA: Insufficient documentation

## 2016-09-20 DIAGNOSIS — I509 Heart failure, unspecified: Secondary | ICD-10-CM | POA: Insufficient documentation

## 2016-09-20 MED ORDER — KETOROLAC TROMETHAMINE 30 MG/ML IJ SOLN
30.0000 mg | Freq: Once | INTRAMUSCULAR | Status: AC
  Administered 2016-09-20: 30 mg via INTRAVENOUS
  Filled 2016-09-20: qty 1

## 2016-09-20 MED ORDER — KETOROLAC TROMETHAMINE 15 MG/ML IJ SOLN
30.0000 mg | Freq: Once | INTRAMUSCULAR | Status: DC
  Filled 2016-09-20: qty 2

## 2016-09-20 MED ORDER — ACETAMINOPHEN 500 MG PO TABS
1000.0000 mg | ORAL_TABLET | Freq: Once | ORAL | Status: AC
  Administered 2016-09-20: 1000 mg via ORAL
  Filled 2016-09-20: qty 2

## 2016-09-20 NOTE — Discharge Instructions (Signed)
On exam there were no signs of dental abscess or other infections. Unfortunately, you will continue to experience pain until you follow up with your dental surgery.  Continue taking your prescribed pain medications along with ibuprofen and Orajel to help with your pain until you're surgery.  Monitor for signs of infection including fever, gum swelling or yellow discharge.  To expedite your oral surgery I recommend you call your dentist daily to try and reschedule your surgery for a sooner date.

## 2016-09-20 NOTE — ED Provider Notes (Signed)
Home DEPT [facility] Provider Note   CSN: [id number] Arrival date & time: 09/20/16  1859  By signing my name below, I, Dora Sims, attest that this documentation has been prepared under the direction and in the presence of Carmon Sails, PA-C. Electronically Signed: Dora Sims, Scribe. 09/20/2016. 7:51 PM.  History   Chief Complaint Chief Complaint  Patient presents with  . Dental Pain   The history is provided by the patient. No language interpreter was used.    HPI Comments: Harold [name] is a 51 y.o. male with PMHx including DM2, DDD, HTN, HLD, and gout who presents to the Emergency Department complaining of persistent, gradually worsening, left lower dental pain for about one month. Reports measured a fever of 101 last night. Patient takes Percocet, gabapentin, ibuprofen/tylenol for DDD and gout and has used them without relief of his dental pain. He has also tried ibuprofen and some OTC oral gels without improvement of his dental pain. He was scheduled to have oral surgery yesterday but had to reschedule the operation for 10/03/16. He denies drooling, trouble swallowing, dyspnea, or any other associated symptoms.  Past Medical History:  Diagnosis Date  . Acute pericarditis    admx 03-26-10 thru 03-29-10; a. echo 03-26-10: EF 55-60%; mild LVH; trivial MR; RVF ok; mild -mod circumferential Eff w/o tamponade  . Allergic rhinitis, cause unspecified 09/14/2012  . Anxiety   . Chronic back pain   . DDD (degenerative disc disease), lumbar 09/14/2012  . Depression   . Diabetes mellitus without complication ([place])   . Erectile dysfunction 09/14/2012  . GERD (gastroesophageal reflux disease)   . Gout    pt denies  . Headache    migraines in the past  . History of pancreatitis    a. admx 04-2009.Marland KitchenMarland Kitchen? 2-2 triglycerides  . Hyperlipidemia 07/11/2013  . Hypertension   . Hypertriglyceridemia    a. followed by [facility]  . Left sided sciatica 09/14/2012  . Metabolic syndrome     . Obesity   . OSA (obstructive sleep apnea)    does not use cpap  . Restless legs     Patient Active Problem List   Diagnosis Date Noted  . Thrush 08/02/2016  . Sleep disturbance 07/04/2016  . 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  . Urinary frequency 07/24/2015  . Trichomonas exposure 07/24/2015  . Type 2 diabetes mellitus ([place]) 03/04/2015  . Verruca vulgaris 02/24/2015  . Nocturia 02/17/2015  . Accessory skin tags 02/17/2015  . Sinusitis, acute 06/04/2014  . Lumbar radiculopathy, acute 05/23/2014  . Acute left-sided back pain with sciatica 04/10/2014  . Smoker 04/10/2014  . Rash 02/28/2014  . Cough 01/01/2014  . Grief reaction 11/19/2013  . Primary localized osteoarthrosis, lower leg 09/04/2013  . Chronic pain syndrome 07/11/2013  . Hyperlipidemia 07/11/2013  . 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 Gonalgia 11/29/2012  . Preventative health care 09/14/2012  . Allergic rhinitis 09/14/2012  . DDD (degenerative disc disease), lumbar 09/14/2012  . Left sided sciatica 09/14/2012  . Erectile dysfunction 09/14/2012  . Pre-ulcerative corn or callous 09/14/2012  . Hemorrhoids 07/26/2011  . Chronic back pain greater than 3 months duration 09/23/2010  . Abnormal EKG 08/12/2010  . OSA (obstructive sleep apnea) 08/09/2010  . 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 CHF (congestive heart failure) ([place]) 06/29/2010  . Edema 06/10/2010  . Acute idiopathic pericarditis 04/08/2010  . NONSPECIFIC ABNORMAL ELECTROCARDIOGRAM 04/08/2010  . HYPERTRIGLYCERIDEMIA 05/26/2009  . SHOULDER PAIN, LEFT 04/21/2008  . DEPRESSION 03/11/2008  . Essential hypertension 03/11/2008  .  Gout, unspecified 05/01/2007  . METABOLIC SYNDROME X 70/96/2836  . OBESITY NOS 10/24/2006    Past Surgical History:  Procedure Laterality Date  . COLONOSCOPY    . KNEE SURGERY     right  . LUMBAR LAMINECTOMY/DECOMPRESSION MICRODISCECTOMY N/A 08/26/2015   Procedure: Lumbar Two-Sacral One  Laminectomy for  decompression;  Surgeon: [name], MD;  Location: [place] [facility];  Service: Neurosurgery;  Laterality: N/A;  . pericardectomy  07/15/2010   Dr. [name]  . pleurx catheter placement  07/15/2010   Dr. [name]  . SHOULDER SURGERY     right and left shoulders  . TONSILLECTOMY AND ADENOIDECTOMY     one tonsil       Home Medications    Prior to Admission medications   Medication Sig Start Date End Date Taking? Authorizing Provider  amLODipine (NORVASC) 10 MG tablet TAKE ONE TABLET BY MOUTH DAILY 06/07/16   Golden Circle, FNP  ammonium lactate (AMLACTIN) 12 % lotion Apply 1 application topically as needed for dry skin. 05/31/16   Landis Martins, DPM  Blood Glucose Monitoring Suppl (ACCU-CHEK NANO SMARTVIEW) W/DEVICE KIT Use glucose meter to check blood sugar 1-3 times daily as directed. 03/10/15   Golden Circle, FNP  cetirizine (ZYRTEC) 5 MG tablet Take 5 mg by mouth daily.    [provider]  citalopram (CELEXA) 40 MG tablet Take 1 tablet (40 mg total) by mouth daily. Yearly physical w/labs due in May must see MD for refills 06/07/16   Golden Circle, FNP  fenofibrate (TRICOR) 145 MG tablet TAKE ONE TABLET BY MOUTH DAILY. 05/17/16   Golden Circle, FNP  fentaNYL (DURAGESIC - DOSED MCG/HR) 75 MCG/HR Place 50 mcg onto the skin every 3 (three) days.     [provider]  furosemide (LASIX) 40 MG tablet TAKE ONE TABLET BY MOUTH DAILY 08/09/16   Golden Circle, FNP  gabapentin (NEURONTIN) 100 MG capsule Take 3 capsules (300 mg total) by mouth 3 (three) times daily. 08/27/15   Ditty, Kevan Ny, MD  glucose blood (ACCU-CHEK SMARTVIEW) test strip Use as instructed 03/10/15   Golden Circle, FNP  ibuprofen (ADVIL,MOTRIN) 800 MG tablet Take 800 mg by mouth every 8 (eight) hours as needed.    [provider]  lisinopril (PRINIVIL,ZESTRIL) 20 MG tablet Take 1 tablet (20 mg total) by mouth 2 (two) times daily. 06/21/16   Golden Circle, FNP    metFORMIN (GLUCOPHAGE) 500 MG tablet TAKE ONE TABLET BY MOUTH TWICE A DAY WITH A MEAL 09/20/16   Golden Circle, FNP  nystatin (MYCOSTATIN) 100000 UNIT/ML suspension Take 5 mLs (500,000 Units total) by mouth 4 (four) times daily. 08/02/16   Golden Circle, FNP  oxyCODONE (OXY IR/ROXICODONE) 5 MG immediate release tablet Take 1-2 tablets (5-10 mg total) by mouth every 3 (three) hours as needed for breakthrough pain. 08/27/15   Ditty, Kevan Ny, MD  oxyCODONE-acetaminophen (PERCOCET) 10-325 MG tablet Take 1 tablet by mouth every 6 (six) hours as needed for pain. 08/27/15   Ditty, Kevan Ny, MD  Potassium Chloride ER 20 MEQ TBCR TAKE ONE TABLET BY MOUTH DAILY 07/01/16   Golden Circle, FNP  rosuvastatin (CRESTOR) 20 MG tablet Take 1 tablet (20 mg total) by mouth daily. Yearly physical w/labs due in May must see MD for refills 06/07/16   Golden Circle, FNP  sildenafil (REVATIO) 20 MG tablet Take 1-5 tablets (20-100 mg total) by mouth daily as needed. 07/04/16   Calone,  Ples Specter, FNP  terbinafine (LAMISIL AT) 1 % cream Apply 1 application topically 2 (two) times daily. 05/31/16   Stover, Lady Saucier, DPM  VOLTAREN 1 % GEL Apply 2 g topically 4 (four) times daily. 02/17/15   Golden Circle, FNP    Family History Family History  Problem Relation Age of Onset  . Diabetes Mother   . Diabetes Father   . Colon cancer Father   . Heart disease Father 78       CABG  . Pancreatitis Father   . Hypertension Other        entire family  . Liver cancer Paternal Uncle   . Diabetes Sister   . Stomach cancer Maternal Grandfather   . Pancreatitis Paternal Uncle        x 2  . Esophageal cancer Neg Hx   . Rectal cancer Neg Hx     Social History Social History  Substance Use Topics  . Smoking status: Current Every Day Smoker    Packs/day: 0.25    Years: 20.00    Types: Cigarettes  . Smokeless tobacco: Never Used  . Alcohol use 0.0 oz/week     Comment: several times a week and mostly on the  weekends     Allergies   Diphenhydramine; Other; Colchicine; Lipitor [atorvastatin]; and Septra [sulfamethoxazole-trimethoprim]   Review of Systems Review of Systems  Constitutional: Positive for fever.  HENT: Positive for dental problem. Negative for drooling and trouble swallowing.   Respiratory: Negative for shortness of breath.   Neurological: Positive for numbness.   Physical Exam Updated Vital Signs BP (!) 153/99   Pulse 63   Temp 98.5 F (36.9 C)   Resp 16   Ht 5' 11" (1.803 m)   Wt 121.6 kg (268 lb)   SpO2 98%   BMI 37.38 kg/m   Physical Exam  Constitutional: He is oriented to person, place, and time. He appears well-developed and well-nourished. No distress.  NAD.  HENT:  Head: Normocephalic and atraumatic.  Right Ear: External ear normal.  Left Ear: External ear normal.  Nose: Nose normal.  Mouth/Throat:    3 missing left lower teeth with local erythema and mild tenderness. No fluctuance or evidence of abscess. No sublingual edema or pooling of oral secretions. Oral airway is widely patent.   Eyes: Conjunctivae and EOM are normal. No scleral icterus.  Neck: Normal range of motion. Neck supple.  Cardiovascular: Normal rate, regular rhythm, normal heart sounds and intact distal pulses.   No murmur heard. Pulmonary/Chest: Effort normal and breath sounds normal. He has no wheezes.  Musculoskeletal: Normal range of motion. He exhibits no deformity.  Neurological: He is alert and oriented to person, place, and time.  Skin: Skin is warm and dry. Capillary refill takes less than 2 seconds.  Psychiatric: He has a normal mood and affect. His behavior is normal. Judgment and thought content normal.  Nursing note and vitals reviewed.  ED Treatments / Results  Labs (all labs ordered are listed, but only abnormal results are displayed) Labs Reviewed - No data to display  EKG  EKG Interpretation None       Radiology No results  found.  Procedures Procedures (including critical care time)  DIAGNOSTIC STUDIES: Oxygen Saturation is 98% on RA, normal by my interpretation.    COORDINATION OF CARE: 7:50 PM Discussed treatment plan with pt at bedside and pt agreed to plan.  Medications Ordered in ED Medications  acetaminophen (TYLENOL) tablet 1,000 mg (1,000 mg Oral  Given 09/20/16 2000)  ketorolac (TORADOL) 30 MG/ML injection 30 mg (30 mg Intravenous Given 09/20/16 2005)     Initial Impression / Assessment and Plan / ED Course  I have reviewed the triage vital signs and the nursing notes.  Pertinent labs & imaging results that were available during my care of the patient were reviewed by me and considered in my medical decision making (see chart for details).    Dental pain associated with poor dental health, multiple missing teeth and cracked tooth but no signs or symptoms of dental abscess on exam with patient afebrile, non toxic appearing, swallowing secretions well without hot potato voice. Exam unconcerning for Ludwig's angina or other deep tissue infection in neck. As there is no gum swelling with fluctuance, erythema, and facial swelling, will treat no treat with antibiotic.  Pt reported fever yesterday, but no fever today. Really low suspicion for abscess to require I&D or abx. Patient was supposed to have dental surgery yesterday but had to reschedule and now presents to the ED with dental pain for one month. Encouraged patient to call oral surgeon to expedite his surgery. No indication for additional rx for narcotic meds tonight.   Final Clinical Impressions(s) / ED Diagnoses   Final diagnoses:  Pain, dental    New Prescriptions Discharge Medication List as of 09/20/2016  7:53 PM     I personally performed the services described in this documentation, which was scribed in my presence. The recorded information has been reviewed and is accurate.    Kinnie Feil, PA-C 09/21/16 0100    Quintella Reichert, MD 09/21/16 [phone]

## 2016-09-20 NOTE — ED Notes (Signed)
Pt verbalizes understanding of d/c instructions and denies any further needs at this time. 

## 2016-09-20 NOTE — ED Triage Notes (Signed)
Pt c/o dental pain x 1 month

## 2016-09-21 ENCOUNTER — Encounter: Payer: Self-pay | Admitting: Family Medicine

## 2016-09-21 ENCOUNTER — Ambulatory Visit (INDEPENDENT_AMBULATORY_CARE_PROVIDER_SITE_OTHER): Payer: Medicare HMO | Admitting: Family Medicine

## 2016-09-21 VITALS — BP 140/92 | HR 60 | Temp 98.8°F | Resp 14 | Ht 71.0 in | Wt 260.0 lb

## 2016-09-21 DIAGNOSIS — K047 Periapical abscess without sinus: Secondary | ICD-10-CM

## 2016-09-21 DIAGNOSIS — J301 Allergic rhinitis due to pollen: Secondary | ICD-10-CM | POA: Diagnosis not present

## 2016-09-21 MED ORDER — AMOXICILLIN 875 MG PO TABS: 875.0000 mg | ORAL_TABLET | Freq: Two times a day (BID) | ORAL | 0 refills | Status: DC

## 2016-09-21 MED ORDER — FLUNISOLIDE 25 MCG/ACT (0.025%) NA SOLN: 2.0000 | Freq: Two times a day (BID) | NASAL | 3 refills | Status: DC

## 2016-09-21 NOTE — Patient Instructions (Addendum)
Can use Afrin type nasal spray twice a day for a maximum of 4 days, use before using prescription nasal spray  Warm compresses to face, go ahead and get two dose of antibiotic in today   Dental Abscess A dental abscess is a collection of pus in or around a tooth. What are the causes? This condition is caused by a bacterial infection around the root of the tooth that involves the inner part of the tooth (pulp). It may result from:  Severe tooth decay.  Trauma to the tooth that allows bacteria to enter into the pulp, such as a broken or chipped tooth.  Severe gum disease around a tooth.  What are the signs or symptoms? Symptoms of this condition include:  Severe pain in and around the infected tooth.  Swelling and redness around the infected tooth, in the mouth, or in the face.  Tenderness.  Pus drainage.  Bad breath.  Bitter taste in the mouth.  Difficulty swallowing.  Difficulty opening the mouth.  Nausea.  Vomiting.  Chills.  Swollen neck glands.  Fever.  How is this diagnosed? This condition is diagnosed with examination of the infected tooth. During the exam, your dentist may tap on the infected tooth. Your dentist will also ask about your medical and dental history and may order X-rays. How is this treated? This condition is treated by eliminating the infection. This may be done with:  Antibiotic medicine.  A root canal. This may be performed to save the tooth.  Pulling (extracting) the tooth. This may also involve draining the abscess. This is done if the tooth cannot be saved.  Follow these instructions at home:  Take medicines only as directed by your dentist.  If you were prescribed antibiotic medicine, finish all of it even if you start to feel better.  Rinse your mouth (gargle) often with salt water to relieve pain or swelling.  Do not drive or operate heavy machinery while taking pain medicine.  Do not apply heat to the outside of your  mouth.  Keep all follow-up visits as directed by your dentist. This is important. Contact a health care provider if:  Your pain is worse and is not helped by medicine. Get help right away if:  You have a fever or chills.  Your symptoms suddenly get worse.  You have a very bad headache.  You have problems breathing or swallowing.  You have trouble opening your mouth.  You have swelling in your neck or around your eye. This information is not intended to replace advice given to you by your health care provider. Make sure you discuss any questions you have with your health care provider. Document Released: 03/14/2005 Document Revised: 07/23/2015 Document Reviewed: 03/11/2014 Elsevier Interactive Patient Education  2017 [employer].

## 2016-09-21 NOTE — Progress Notes (Signed)
Subjective:    Patient ID: Harold [name], male    DOB: [birth year], 51 y.o.   MRN: [id number]  HPI This is a 51 yo male who presents today with left sided face pain. Was seen in ER yesterday and given a shot of Toradol. Does not need pain meds, takes oxycodone 4x/day. Has an appointment with oral surgeon tomorrow. Was seen by dentist and took amoxicillin for 2 weeks pain relieved while on   Ran fever to 101, 4 days ago.   Has chronic sinus problems and takes tylenol sinus and cetirizine. Has been on flonase in past which burned his nose. Currently having nasal drainage, sinus pressure, can't tell if sinus in addition to tooth.  Blood sugars running "good," last reading 96 yesterday.   Past Medical History:  Diagnosis Date  . Acute pericarditis    admx 03-26-10 thru 03-29-10; a. echo 03-26-10: EF 55-60%; mild LVH; trivial MR; RVF ok; mild -mod circumferential Eff w/o tamponade  . Allergic rhinitis, cause unspecified 09/14/2012  . Anxiety   . Chronic back pain   . DDD (degenerative disc disease), lumbar 09/14/2012  . Depression   . Diabetes mellitus without complication ([place])   . Erectile dysfunction 09/14/2012  . GERD (gastroesophageal reflux disease)   . Gout    pt denies  . Headache    migraines in the past  . History of pancreatitis    a. admx 04-2009.Marland KitchenMarland Kitchen? 2-2 triglycerides  . Hyperlipidemia 07/11/2013  . Hypertension   . Hypertriglyceridemia    a. followed by [facility]  . Left sided sciatica 09/14/2012  . Metabolic syndrome   . Obesity   . OSA (obstructive sleep apnea)    does not use cpap  . Restless legs    Past Surgical History:  Procedure Laterality Date  . COLONOSCOPY    . KNEE SURGERY     right  . LUMBAR LAMINECTOMY/DECOMPRESSION MICRODISCECTOMY N/A 08/26/2015   Procedure: Lumbar Two-Sacral One  Laminectomy for decompression;  Surgeon: [name], MD;  Location: [facility];  Service: Neurosurgery;  Laterality: N/A;  . pericardectomy  07/15/2010   Dr.  [name]  . pleurx catheter placement  07/15/2010   Dr. [name]  . SHOULDER SURGERY     right and left shoulders  . TONSILLECTOMY AND ADENOIDECTOMY     one tonsil   Family History  Problem Relation Age of Onset  . Diabetes Mother   . Diabetes Father   . Colon cancer Father   . Heart disease Father 54       CABG  . Pancreatitis Father   . Hypertension Other        entire family  . Liver cancer Paternal Uncle   . Diabetes Sister   . Stomach cancer Maternal Grandfather   . Pancreatitis Paternal Uncle        x 2  . Esophageal cancer Neg Hx   . Rectal cancer Neg Hx    Social History  Substance Use Topics  . Smoking status: Current Every Day Smoker    Packs/day: 0.25    Years: 20.00    Types: Cigarettes  . Smokeless tobacco: Never Used  . Alcohol use 0.0 oz/week     Comment: several times a week and mostly on the weekends      Review of Systems Per HPI    Objective:   Physical Exam  Constitutional: He appears well-developed and well-nourished. No distress.  Obese, appears mildly uncomfortable.   HENT:  Head:  Normocephalic and atraumatic.  Nose: Mucosal edema and rhinorrhea present. Right sinus exhibits no maxillary sinus tenderness and no frontal sinus tenderness. Left sinus exhibits no maxillary sinus tenderness and no frontal sinus tenderness.  Mouth/Throat: Uvula is midline. No oropharyngeal exudate or posterior oropharyngeal edema.  Poor dentition, broken off tooth on left lower side and right lower side. Left gumline tender, pale, no drainage.   Eyes: Conjunctivae are normal. Right eye exhibits discharge. Left eye exhibits discharge.  Watery eyes.   Neck: Normal range of motion. Neck supple.  Lymphadenopathy:    He has no cervical adenopathy.  Skin: He is not diaphoretic.  Vitals reviewed.      BP (!) 140/92 (BP Location: Left Arm, Patient Position: Sitting, Cuff Size: Large)   Pulse 60   Temp 98.8 F (37.1 C) (Oral)   Resp 14   Ht 5\' 11"  (1.803 m)    Wt 260 lb (117.9 kg)   SpO2 98%   BMI 36.26 kg/m  Wt Readings from Last 3 Encounters:  09/21/16 260 lb (117.9 kg)  09/20/16 268 lb (121.6 kg)  08/05/16 268 lb (121.6 kg)       Assessment & Plan:  1. Dental abscess - will put back on antibiotic, was previously on amoxicillin and reports good relief, will restart - only additional treatment is removal of broken off tooth, follow up with oral surgeon tomorrow as scheduled - amoxicillin (AMOXIL) 875 MG tablet; Take 1 tablet (875 mg total) by mouth 2 (two) times daily.  Dispense: 14 tablet; Refill: 0  2. Seasonal allergic rhinitis due to pollen - Afrin BID x 4 days prior to using flunisolide - discussed benefit of inhaled nasal steroid with chronic allergies - flunisolide (NASALIDE) 25 MCG/ACT (0.025%) SOLN; Place 2 sprays into the nose 2 (two) times daily.  Dispense: 1 Bottle; Refill: [place], FNP-BC  [facility] [place]  09/21/2016 9:14 AM

## 2016-09-22 DIAGNOSIS — M47818 Spondylosis without myelopathy or radiculopathy, sacral and sacrococcygeal region: Secondary | ICD-10-CM | POA: Diagnosis not present

## 2016-09-22 DIAGNOSIS — M47817 Spondylosis without myelopathy or radiculopathy, lumbosacral region: Secondary | ICD-10-CM | POA: Diagnosis not present

## 2016-10-03 ENCOUNTER — Ambulatory Visit (AMBULATORY_SURGERY_CENTER): Payer: Self-pay | Admitting: *Deleted

## 2016-10-03 VITALS — Ht 71.0 in | Wt 257.6 lb

## 2016-10-03 DIAGNOSIS — D3A026 Benign carcinoid tumor of the rectum: Secondary | ICD-10-CM

## 2016-10-03 DIAGNOSIS — Z800 Family history of malignant neoplasm of digestive organs: Secondary | ICD-10-CM

## 2016-10-03 MED ORDER — NA SULFATE-K SULFATE-MG SULF 17.5-3.13-1.6 GM/177ML PO SOLN: ORAL | 0 refills | Status: DC

## 2016-10-03 NOTE — Progress Notes (Signed)
No allergies to eggs or soy. No problems with anesthesia.  Pt given Emmi instructions for colonoscopy  No oxygen use  No diet drug use  

## 2016-10-09 ENCOUNTER — Other Ambulatory Visit: Payer: Self-pay | Admitting: Internal Medicine

## 2016-10-09 ENCOUNTER — Other Ambulatory Visit: Payer: Self-pay | Admitting: Family

## 2016-10-13 ENCOUNTER — Encounter: Payer: Self-pay | Admitting: Family

## 2016-10-13 ENCOUNTER — Ambulatory Visit (INDEPENDENT_AMBULATORY_CARE_PROVIDER_SITE_OTHER): Payer: Medicare HMO | Admitting: Family

## 2016-10-13 VITALS — BP 158/90 | HR 75 | Temp 98.4°F | Resp 16 | Ht 71.0 in | Wt 257.0 lb

## 2016-10-13 DIAGNOSIS — B370 Candidal stomatitis: Secondary | ICD-10-CM

## 2016-10-13 DIAGNOSIS — F3340 Major depressive disorder, recurrent, in remission, unspecified: Secondary | ICD-10-CM | POA: Diagnosis not present

## 2016-10-13 MED ORDER — FLUCONAZOLE 100 MG PO TABS: ORAL_TABLET | ORAL | 1 refills | Status: DC

## 2016-10-13 NOTE — Assessment & Plan Note (Signed)
Symptoms remain consistent with thrush not fully improved with nystatin mouthwash. Discontinue nystatin mouthwash. Start fluconazole.follow-up ifdo not improve.

## 2016-10-13 NOTE — Patient Instructions (Signed)
Thank you for choosing [facility].  SUMMARY AND INSTRUCTIONS:  Continue to take your medications as prescribed.  Take the fluconazole and celexa on opposite days.  Follow up for symptoms worsening.   Follow up:  If your symptoms worsen or fail to improve, please contact our office for further instruction, or in case of emergency go directly to the emergency room at the closest medical facility.

## 2016-10-13 NOTE — Assessment & Plan Note (Signed)
Depression appears adequately controlled with current medication regimen and no adverse side effects or suicidal ideations. No signs of psychotic features. Continue current dosage of Celexa.

## 2016-10-13 NOTE — Progress Notes (Signed)
Subjective:    Patient ID: Harold [name], male    DOB: 07-19-1965, 51 y.o.   MRN: [id number]  Chief Complaint  Patient presents with  . Medication Refill    was told to follow up for celexa, wants to get mouth wash    HPI:  Harold [name] is a 51 y.o. male who  has a past medical history of Acute pericarditis; Allergic rhinitis, cause unspecified (09/14/2012); Allergy; Anxiety; Chronic back pain; DDD (degenerative disc disease), lumbar (09/14/2012); Depression; Diabetes mellitus without complication ([place]); Erectile dysfunction (09/14/2012); GERD (gastroesophageal reflux disease); Gout; Headache; History of pancreatitis; Hyperlipidemia (07/11/2013); Hypertension; Hypertriglyceridemia; Left sided sciatica (09/14/2012); Metabolic syndrome; Obesity; OSA (obstructive sleep apnea); and Restless legs. and presents today for a follow up office visit.  1.) Depression - Currently maintained on Celexa. Reports taking the medication as prescribed and denies adverse side effects. No suicidal ideations. Mood is well-controlled with current medication regimen   2.) Thrush - previously diagnosed with fungal infection and prescribed nystatin mouthwash. Reports taking medication as prescribed and denies adverse side effects. Symptoms improved with mouthwash but then returned. Continues to experience associated symptom of a rash located in his mouth described as white. Indicates previous prescription for fluconazole which was effective in the past.  Allergies  Allergen Reactions  . Diphenhydramine Itching, Palpitations and Other (See Comments)    "jittery"  . Other Itching and Swelling    Pecans: itching and swelling of the tongue   . Colchicine Diarrhea  . Lipitor [Atorvastatin] Other (See Comments)    Muscle cramps  . Septra [Sulfamethoxazole-Trimethoprim] Itching      Outpatient Medications Prior to Visit  Medication Sig Dispense Refill  . amLODipine (NORVASC) 10 MG tablet TAKE ONE TABLET BY MOUTH  DAILY 90 tablet 1  . ammonium lactate (AMLACTIN) 12 % lotion Apply 1 application topically as needed for dry skin. 400 g 3  . amoxicillin (AMOXIL) 875 MG tablet Take 1 tablet (875 mg total) by mouth 2 (two) times daily. 14 tablet 0  . aspirin EC 81 MG tablet Take 81 mg by mouth daily.    . Blood Glucose Monitoring Suppl (ACCU-CHEK NANO SMARTVIEW) W/DEVICE KIT Use glucose meter to check blood sugar 1-3 times daily as directed. 1 kit 0  . cetirizine (ZYRTEC) 5 MG tablet Take 5 mg by mouth daily.    . citalopram (CELEXA) 40 MG tablet Take 1 tablet (40 mg total) by mouth daily. 90 tablet 0  . fenofibrate (TRICOR) 145 MG tablet TAKE ONE TABLET BY MOUTH DAILY. 90 tablet 0  . fentaNYL (DURAGESIC - DOSED MCG/HR) 75 MCG/HR Place 50 mcg onto the skin every 3 (three) days.     . flunisolide (NASALIDE) 25 MCG/ACT (0.025%) SOLN Place 2 sprays into the nose 2 (two) times daily. 1 Bottle 3  . furosemide (LASIX) 40 MG tablet TAKE ONE TABLET BY MOUTH DAILY 90 tablet 0  . gabapentin (NEURONTIN) 100 MG capsule Take 3 capsules (300 mg total) by mouth 3 (three) times daily. 90 capsule 3  . glucose blood (ACCU-CHEK SMARTVIEW) test strip Use as instructed 100 each 12  . ibuprofen (ADVIL,MOTRIN) 800 MG tablet Take 800 mg by mouth every 8 (eight) hours as needed.    [name] lisinopril (PRINIVIL,ZESTRIL) 20 MG tablet Take 1 tablet (20 mg total) by mouth 2 (two) times daily. 180 tablet 2  . metFORMIN (GLUCOPHAGE) 500 MG tablet TAKE ONE TABLET BY MOUTH TWICE A DAY WITH A MEAL 60 tablet 0  . Na  Sulfate-K Sulfate-Mg Sulf (SUPREP BOWEL PREP KIT) 17.5-3.13-1.6 GM/180ML SOLN suprep as directed.  No substitutions 354 mL 0  . oxyCODONE-acetaminophen (PERCOCET) 10-325 MG tablet Take 1 tablet by mouth every 6 (six) hours as needed for pain. 90 tablet 0  . Potassium Chloride ER 20 MEQ TBCR TAKE ONE TABLET BY MOUTH DAILY 90 tablet 0  . rosuvastatin (CRESTOR) 20 MG tablet TAKE 1 TABLET (20 MG TOTAL) BY MOUTH DAILY. 90 tablet 0  . sildenafil  (REVATIO) 20 MG tablet Take 1-5 tablets (20-100 mg total) by mouth daily as needed. 50 tablet 2  . terbinafine (LAMISIL AT) 1 % cream Apply 1 application topically 2 (two) times daily. 36 g 3  . VOLTAREN 1 % GEL Apply 2 g topically 4 (four) times daily. 100 g 5  . nystatin (MYCOSTATIN) 100000 UNIT/ML suspension TAKE 5 ML (ONE TEASPOONFUL) BY MOUTH FOUR TIMES A DAY 60 mL 0   No facility-administered medications prior to visit.     Review of Systems  Constitutional: Negative for chills and fever.  HENT: Negative for congestion.   Respiratory: Negative for chest tightness and shortness of breath.   Cardiovascular: Negative for chest pain, palpitations and leg swelling.  Psychiatric/Behavioral: Negative for behavioral problems, decreased concentration, dysphoric mood, self-injury, sleep disturbance and suicidal ideas. The patient is not nervous/anxious and is not hyperactive.       Objective:    BP (!) 158/90 (BP Location: Left Arm, Patient Position: Sitting, Cuff Size: Large)   Pulse 75   Temp 98.4 F (36.9 C) (Oral)   Resp 16   Ht '5\' 11"'  (1.803 m)   Wt 257 lb (116.6 kg)   SpO2 98%   BMI 35.84 kg/m  Nursing note and vital signs reviewed.  Physical Exam  Constitutional: He is oriented to person, place, and time. He appears well-developed and well-nourished. No distress.  HENT:  Small amount of white coating noted in mouth and in corners of mouth.   Cardiovascular: Normal rate, regular rhythm, normal heart sounds and intact distal pulses.   Pulmonary/Chest: Effort normal and breath sounds normal.  Neurological: He is alert and oriented to person, place, and time.  Skin: Skin is warm and dry.  Psychiatric: He has a normal mood and affect. His behavior is normal. Judgment and thought content normal. His mood appears not anxious. His affect is not angry. He does not exhibit a depressed mood.       Assessment & Plan:   Problem List Items Addressed This Visit      Digestive    Thrush    Symptoms remain consistent with thrush not fully improved with nystatin mouthwash. Discontinue nystatin mouthwash. Start fluconazole.follow-up ifdo not improve.      Relevant Medications   fluconazole (DIFLUCAN) 100 MG tablet     Other   Depression - Primary    Depression appears adequately controlled with current medication regimen and no adverse side effects or suicidal ideations. No signs of psychotic features. Continue current dosage of Celexa.          I have discontinued Mr. Raffel nystatin. I am also having him start on fluconazole. Additionally, I am having him maintain his VOLTAREN, ACCU-CHEK NANO SMARTVIEW, glucose blood, cetirizine, gabapentin, oxyCODONE-acetaminophen, ibuprofen, fentaNYL, fenofibrate, ammonium lactate, terbinafine, amLODipine, lisinopril, Potassium Chloride ER, sildenafil, furosemide, metFORMIN, amoxicillin, flunisolide, aspirin EC, Na Sulfate-K Sulfate-Mg Sulf, citalopram, and rosuvastatin.   Meds ordered this encounter  Medications  . fluconazole (DIFLUCAN) 100 MG tablet    Sig: Take 1  tablet by mouth twice daily for one day then 1 tablet every other day for 10 days.    Dispense:  8 tablet    Refill:  1    Order Specific Question:   Supervising Provider    Answer:   Pricilla Holm A [[id number]]     Follow-up: Return if symptoms worsen or fail to improve.  Mauricio Po, FNP

## 2016-10-16 ENCOUNTER — Ambulatory Visit (INDEPENDENT_AMBULATORY_CARE_PROVIDER_SITE_OTHER): Payer: Medicare HMO | Admitting: Neurology

## 2016-10-16 DIAGNOSIS — G4734 Idiopathic sleep related nonobstructive alveolar hypoventilation: Secondary | ICD-10-CM

## 2016-10-16 DIAGNOSIS — G4733 Obstructive sleep apnea (adult) (pediatric): Secondary | ICD-10-CM | POA: Diagnosis not present

## 2016-10-16 DIAGNOSIS — R351 Nocturia: Secondary | ICD-10-CM

## 2016-10-16 DIAGNOSIS — G4720 Circadian rhythm sleep disorder, unspecified type: Secondary | ICD-10-CM

## 2016-10-17 ENCOUNTER — Encounter (HOSPITAL_COMMUNITY): Payer: Self-pay | Admitting: *Deleted

## 2016-10-18 ENCOUNTER — Telehealth: Payer: Self-pay

## 2016-10-18 NOTE — Telephone Encounter (Signed)
I called pt. I advised pt that Dr. Rexene Alberts reviewed their sleep study results and found that pt has severe osa with low oxygen. There was absence of REM sleep and near absence of supine sleep which could likely underestimate his AHI. I advised pt to lose wight, stop smoking, and limit or eliminate narcotic pain medication. I also advised pt that Dr. Rexene Alberts recommends treatment for his osa in the form of a cpap. Dr. Rexene Alberts recommends that pt return for a repeat sleep study in order to properly titrate the cpap and ensure a good mask fit. Pt is agreeable to returning for a titration study. I advised pt that our sleep lab will file with pt's insurance and call pt to schedule the sleep study when we hear back from the pt's insurance regarding coverage of this sleep study. Pt verbalized understanding of results. Pt had no questions at this time but was encouraged to call back if questions arise.

## 2016-10-18 NOTE — Addendum Note (Signed)
Addended by: Star Age on: 10/18/2016 08:09 AM   Modules accepted: Orders

## 2016-10-18 NOTE — Progress Notes (Signed)
Patient referred by Terri Piedra, PA, seen by me on 07/28/16, diagnostic PSG on 10/16/16.    Please call and notify the patient that the recent sleep study did confirm the diagnosis of severe obstructive sleep apnea, with a total AHI of 93.1/hour and O2 nadir of 77% with significant time below 89% saturation of over 2 hours. Please note, the absence of REM sleep and near-absence of supine sleep likely underestimate his AHI and O2 nadir. The patient is strongly advised to lose weight, stop smoking and limit or eliminate narcotic pain medication. I recommend treatment in the form of CPAP. This will require a repeat sleep study for proper titration and mask fitting. Please explain to patient and arrange for a CPAP titration study. I have placed an order in the chart. Thanks, and please route to [facility] for scheduling next sleep study.  Star Age, MD, PhD [facility])

## 2016-10-18 NOTE — Telephone Encounter (Signed)
-----   Message from Star Age, MD sent at 10/18/2016  8:09 AM EDT ----- Patient referred by Terri Piedra, PA, seen by me on 07/28/16, diagnostic PSG on 10/16/16.    Please call and notify the patient that the recent sleep study did confirm the diagnosis of severe obstructive sleep apnea, with a total AHI of 93.1/hour and O2 nadir of 77% with significant time below 89% saturation of over 2 hours. Please note, the absence of REM sleep and near-absence of supine sleep likely underestimate his AHI and O2 nadir. The patient is strongly advised to lose weight, stop smoking and limit or eliminate narcotic pain medication. I recommend treatment in the form of CPAP. This will require a repeat sleep study for proper titration and mask fitting. Please explain to patient and arrange for a CPAP titration study. I have placed an order in the chart. Thanks, and please route to [facility] for scheduling next sleep study.  Star Age, MD, PhD [facility])

## 2016-10-18 NOTE — Procedures (Signed)
PATIENT'S NAME:  Harold [name], Harold [name] DOB:      06-17-1965      MR#:    [id number]     DATE OF RECORDING: 10/16/2016 REFERRING M.D.:  Mauricio Po, FNP Study Performed:   Baseline Polysomnogram HISTORY: 51 year old man with a history of pericarditis, allergic rhinitis, degenerative disc disease, depression, diabetes, ED, reflux disease, gout, smoking, hyperlipidemia, hypertension, restless leg syndrome, and obesity, who was previously diagnosed with obstructive sleep apnea several years ago, and no longer is on CPAP. The patient endorsed the Epworth Sleepiness Scale at 7 points. The patient's weight 266 pounds with a height of 71 (inches), resulting in a BMI of 37.3 kg/m2. The patient's neck circumference measured 20.4 inches.  CURRENT MEDICATIONS: Norvasc, Amlactin, Accu-check Nano, Zyrtec, Celexa, Tricor, Fentanyl, Lasix, Neurontin, Advil, Zestril, Glucophage, Oxycodone, Percocet, Potassium chloride, Crestor, Revatio, Lamasil cream, Voltaren gel   PROCEDURE:  This is a multichannel digital polysomnogram utilizing the Somnostar 11.2 system.  Electrodes and sensors were applied and monitored per AASM Specifications.   EEG, EOG, Chin and Limb EMG, were sampled at 200 Hz.  ECG, Snore and Nasal Pressure, Thermal Airflow, Respiratory Effort, CPAP Flow and Pressure, Oximetry was sampled at 50 Hz. Digital video and audio were recorded.      BASELINE STUDY  Lights Out was at 22:54 and Lights On at 04:55.  Total recording time (TRT) was 361 minutes, with a total sleep time (TST) of  142.5 minutes.   The patient's sleep latency was 185 minutes, which is markedly delayed. REM sleep was absent. The sleep efficiency was 39.5%, which is markedly reduced.     SLEEP ARCHITECTURE: WASO (Wake after sleep onset) was 96 minutes with moderate sleep fragmentation noted and 5 bathroom breaks. There were 47.5 minutes in Stage N1, 95 minutes Stage N2, 0 minutes Stage N3 and 0 minutes in Stage REM. The percentage of Stage N1 was  33.3%, which is highly increased, Stage N2 was 66.7%, which is increased, Stage N3 and Stage R (REM sleep) were abssent. The arousals were noted as: 0 were spontaneous, 0 were associated with PLMs, 109 were associated with respiratory events.   Audio and video analysis did not show any abnormal or unusual movements, behaviors, phonations or vocalizations.  The patient took 5 bathroom breaks after lights out. Moderate to loud snoring was noted.  EKG was in keeping with normal sinus rhythm (NSR).  RESPIRATORY ANALYSIS:  There were a total of 221 respiratory events:  198 obstructive apneas, 0 central apneas and 0 mixed apneas with a total of 198 apneas and an apnea index (AI) of 83.4 /hour. There were 23 hypopneas with a hypopnea index of 9.7 /hour. The patient also had 0 respiratory event related arousals (RERAs).      The total APNEA/HYPOPNEA INDEX (AHI) was 93.1/hour and the total RESPIRATORY DISTURBANCE INDEX was 93.1 /hour.  0 events occurred in REM sleep and 46 events in NREM. The REM AHI was 0 /hour, versus a non-REM AHI of 93.1. The patient spent 3.5 minutes of total sleep time in the supine position and 139 minutes in non-supine.. The supine AHI was 51.4 versus a non-supine AHI of 94.1.  OXYGEN SATURATION & C02:  The Wake baseline 02 saturation was 92%, with the lowest being 77%. Time spent below 89% saturation equaled 121 minutes.  PERIODIC LIMB MOVEMENTS: The patient had a total of 0 Periodic Limb Movements.  The Periodic Limb Movement (PLM) index was 0 and the PLM Arousal index was 0/hour. Post-study, the patient  indicated that sleep was the same as usual.   IMPRESSION: 1. Obstructive Sleep Apnea (OSA) 2. Dysfunctions associated with sleep stages or arousal from sleep 3. Nocturia 4. Nocturnal hypoxemia  RECOMMENDATIONS: 1. This study demonstrates severe obstructive sleep apnea, with a total AHI of 93.1/hour and O2 nadir of 77% with significant time below 89% saturation of over 2 hours.  Please note, the absence of REM sleep and near-absence of supine sleep likely underestimate his AHI and O2 nadir. The patient is strongly advised to lose weight, stop smoking and limit or eliminate narcotic pain medication.  2. Treatment with positive airway pressure in the form of CPAP is recommended. This will require a full night titration study to optimize therapy. Other treatment options are limited for this degree of OSA.     3. Please note that untreated obstructive sleep apnea carries additional perioperative morbidity. Patients with significant obstructive sleep apnea should receive perioperative PAP therapy and the surgeons and particularly the anesthesiologist should be informed of the diagnosis and the severity of the sleep disordered breathing. 4. This study shows sleep fragmentation and abnormal sleep stage percentages; these are nonspecific findings and per se do not signify an intrinsic sleep disorder or a cause for the patient's sleep-related symptoms. Causes include (but are not limited to) the first night effect of the sleep study, circadian rhythm disturbances, medication effect or an underlying mood disorder or medical problem. He was noted to have significant nocturia, which may be related to his OSA. An underlying primary urological cause cannot be excluded and further evaluation with a urologist may be feasible.  5. The patient should be cautioned not to drive, work at heights, or operate dangerous or heavy equipment when tired or sleepy. Review and reiteration of good sleep hygiene measures should be pursued with any patient. 6. The patient will be seen in follow-up by Dr. Rexene Alberts at [facility] for discussion of the test results and further management strategies. The referring provider will be notified of the test results.  I certify that I have reviewed the entire raw data recording prior to the issuance of this report in accordance with the Standards of Accreditation of the American Academy  of Sleep Medicine (AASM)   Star Age, MD, PhD Diplomat, American Board of Psychiatry and Neurology (Neurology and Sleep Medicine)

## 2016-10-24 ENCOUNTER — Telehealth: Payer: Self-pay | Admitting: Internal Medicine

## 2016-10-24 NOTE — Telephone Encounter (Signed)
Noted, It will be up to him to reschedule high risk screening colon based on family history I will cc: PCP also

## 2016-10-24 NOTE — Telephone Encounter (Signed)
Appointment at [facility] for tomorrow cancelled, Dr. Hilarie Fredrickson aware.

## 2016-10-25 ENCOUNTER — Ambulatory Visit (HOSPITAL_COMMUNITY): Admission: RE | Admit: 2016-10-25 | Payer: Medicare HMO | Source: Ambulatory Visit | Admitting: Internal Medicine

## 2016-10-25 SURGERY — COLONOSCOPY WITH PROPOFOL
Anesthesia: Monitor Anesthesia Care

## 2016-10-26 DIAGNOSIS — M25561 Pain in right knee: Secondary | ICD-10-CM | POA: Diagnosis not present

## 2016-10-31 DIAGNOSIS — M1711 Unilateral primary osteoarthritis, right knee: Secondary | ICD-10-CM | POA: Diagnosis not present

## 2016-11-01 ENCOUNTER — Other Ambulatory Visit: Payer: Self-pay | Admitting: Family

## 2016-11-03 ENCOUNTER — Ambulatory Visit (INDEPENDENT_AMBULATORY_CARE_PROVIDER_SITE_OTHER): Payer: Medicare HMO | Admitting: Neurology

## 2016-11-03 DIAGNOSIS — G4720 Circadian rhythm sleep disorder, unspecified type: Secondary | ICD-10-CM

## 2016-11-03 DIAGNOSIS — M25561 Pain in right knee: Secondary | ICD-10-CM | POA: Diagnosis not present

## 2016-11-03 DIAGNOSIS — Z9989 Dependence on other enabling machines and devices: Secondary | ICD-10-CM

## 2016-11-03 DIAGNOSIS — G4733 Obstructive sleep apnea (adult) (pediatric): Secondary | ICD-10-CM

## 2016-11-04 ENCOUNTER — Other Ambulatory Visit: Payer: Self-pay | Admitting: Family

## 2016-11-04 DIAGNOSIS — I10 Essential (primary) hypertension: Secondary | ICD-10-CM

## 2016-11-06 ENCOUNTER — Other Ambulatory Visit: Payer: Self-pay | Admitting: Internal Medicine

## 2016-11-07 NOTE — Addendum Note (Signed)
Addended by: Star Age on: 11/07/2016 08:42 AM   Modules accepted: Orders

## 2016-11-07 NOTE — Procedures (Signed)
PATIENT'S NAME:  Harold [name], Harold [name] DOB:      Nov 06, 1965      MR#:    [id number]     DATE OF RECORDING: 11/03/2016 REFERRING M.D.:  Mauricio Po, FNP Study Performed:   CPAP  Titration HISTORY: 51 year old man with a history of pericarditis, allergic rhinitis, degenerative disc disease, depression, diabetes, ED, reflux disease, gout, hyperlipidemia, hypertension, restless leg syndrome, and obesity. Who returns for a full night PAP titration. His diagnostic PSG on 10/16/16 showed a total AHI of 93.1/hour and O2 nadir of 77% with significant time below 89% saturation of over 2 hours. The patient endorsed the Epworth Sleepiness Scale at 7 points. The patient's weight 266 pounds with a height of 71 (inches), resulting in a BMI of 37.3 kg/m2. The patient's neck circumference measured 20 inches.  CURRENT MEDICATIONS: Norvasc, Amlactin, Accu-check Nano, Zyrtec, Celexa, Tricor, Fentanyl, Lasix, Neurontin, Advil, Zestril, Glucophage, Oxycodone, Percocet, Potassium chloride, Crestor, Revatio, Lamasil cream, Voltaren gel.    PROCEDURE:  This is a multichannel digital polysomnogram utilizing the SomnoStar 11.2 system.  Electrodes and sensors were applied and monitored per AASM Specifications.   EEG, EOG, Chin and Limb EMG, were sampled at 200 Hz.  ECG, Snore and Nasal Pressure, Thermal Airflow, Respiratory Effort, CPAP Flow and Pressure, Oximetry was sampled at 50 Hz. Digital video and audio were recorded.      The patient was fitted with a small Simplus FFM. CPAP was initiated at 5 cmH20 with heated humidity per AASM standards and pressure was advanced to 16 cmH20 because of hypopneas, apneas and desaturations.  He was switched to standard BiPAP at 18/14 and then 20/16, but his sleep disordered breathing was not optimally controlled until he was tried on BiPAP ST of 22/18 with a rate of 10/min, at which point his AHI was 0/hour and O2 nadir was 89% with supine NREM sleep achieved.     Lights Out was at 22:09 and  lights on at 05:31. Total recording time (TRT) was 437 minutes, with a total sleep time (TST) of 301 minutes. The patient's sleep latency was 36.5 minutes, which is delayed. REM latency was 213.5 minutes, which is markedly delayed. The sleep efficiency was 68.9 %.    SLEEP ARCHITECTURE: WASO (Wake after sleep onset) was 102 minutes with longer periods of wakefulness. There were 13.5 minutes in Stage N1, 194 minutes Stage N2, 43.5 minutes Stage N3 and 50 minutes in Stage REM.  The percentage of Stage N1 was 4.5%, Stage N2 was 64.5%, which is increased, Stage N3 was 14.5%, and Stage R (REM sleep) was 16.6%, which is reduced. The arousals were noted as: 42 were spontaneous, 0 were associated with PLMs, 86 were associated with respiratory events.  Audio and video analysis did not show any abnormal or unusual movements, behaviors, phonations or vocalizations.  The patient took 3 bathroom breaks. []  EKG was in keeping with normal sinus rhythm (NSR).   RESPIRATORY ANALYSIS:  There was a total of 106 respiratory events: 22 obstructive apneas, 34 central apneas and 8 mixed apneas with a total of 64 apneas and an apnea index (AI) of 12.8 /hour. There were 42 hypopneas with a hypopnea index of 8.4/hour. The patient also had 0 respiratory event related arousals (RERAs).      The total APNEA/HYPOPNEA INDEX  (AHI) was 21.1 /hour and the total RESPIRATORY DISTURBANCE INDEX was 21.1 .hour  17 events occurred in REM sleep and 89 events in NREM. The REM AHI was 20.4 /hour versus a non-REM AHI  of 21.3 /hour.  The patient spent 67 minutes of total sleep time in the supine position and 234 minutes in non-supine. The supine AHI was 58.2, versus a non-supine AHI of 10.6.  OXYGEN SATURATION & C02:  The baseline 02 saturation was 96%, with the lowest being 82%. Time spent below 89% saturation equaled 14 minutes.  PERIODIC LIMB MOVEMENTS: The patient had a total of 0 Periodic Limb Movements. The Periodic Limb Movement (PLM)  index was 0 and the PLM Arousal index was 0 /hour.  Post-study, the patient indicated that sleep was better than usual.   IMPRESSION: 1. Obstructive Sleep Apnea (OSA) 2. Dysfunctions associated with sleep stages or arousal from sleep 3. Insufficient treatment with CPAP  RECOMMENDATIONS: 1. This study demonstrates resolution of the patient's obstructive sleep apnea with BiPAP ST and insufficient treatment with CPAP and standard BiPAP therapy. I will, therefore, start the patient on home BiPAP ST 22/18 cm and back up rate of 10/min, via small FFM with heated humidity. The patient should be reminded to be fully compliant with PAP therapy to improve sleep related symptoms and decrease long term cardiovascular risks. The patient should be reminded, that it may take up to 3 months to get fully used to using PAP with all planned sleep. The earlier full compliance is achieved, the better long term compliance tends to be. Please note that untreated obstructive sleep apnea carries additional perioperative morbidity. Patients with significant obstructive sleep apnea should receive perioperative PAP therapy and the surgeons and particularly the anesthesiologist should be informed of the diagnosis and the severity of the sleep disordered breathing. 2. This study shows sleep fragmentation and abnormal sleep stage percentages; these are nonspecific findings and per se do not signify an intrinsic sleep disorder or a cause for the patient's sleep-related symptoms. Causes include (but are not limited to) the first night effect of the sleep study, circadian rhythm disturbances, medication effect or an underlying mood disorder or medical problem.  3. The patient should be cautioned not to drive, work at heights, or operate dangerous or heavy equipment when tired or sleepy. Review and reiteration of good sleep hygiene measures should be pursued with any patient. The use of narcotic pain medication should be limited and,  ideally, eliminated due to severity of his OSA noted and severe desaturations during the baseline study, as well as high PAP pressure need and back up rate needed.  4. The patient will be seen in follow-up by Dr. Rexene Alberts at [facility] for discussion of the test results and further management strategies. The referring provider will be notified of the test results.   I certify that I have reviewed the entire raw data recording prior to the issuance of this report in accordance with the Standards of Accreditation of the American Academy of Sleep Medicine (AASM)     Star Age, MD, PhD Diplomat, American Board of Psychiatry and Neurology (Neurology and Sleep Medicine)

## 2016-11-07 NOTE — Progress Notes (Signed)
Patient referred by Terri Piedra, PA, seen by me on 07/28/16, diagnostic PSG on 10/16/16, PAP titration on 11/03/16.     Please call and inform patient that I have entered an order for treatment with positive airway pressure (PAP) treatment of obstructive sleep apnea (OSA). He did fairly well during the latest sleep study with BiPAP ST, required a high pressure and back up rate to regulate sleep apnea and breathing as well as O2 sats. We will, therefore, arrange for a machine for home use through a DME (durable medical equipment) company of His choice; and I will see the patient back in follow-up in about 10 weeks. Please also explain to the patient that I will be looking out for compliance data, which can be downloaded from the machine (stored on an SD card, that is inserted in the machine) or via remote access through a modem, that is built into the machine. At the time of the followup appointment we will discuss sleep study results and how it is going with PAP treatment at home. Please advise patient to bring His machine at the time of the first FU visit, even though this is cumbersome. Bringing the machine for every visit after that will likely not be needed, but often helps for the first visit to troubleshoot if needed. Please re-enforce the importance of compliance with treatment and the need for [place] to monitor compliance data - often an insurance requirement and actually good feedback for the patient as far as how they are doing.  Also remind patient, that any interim PAP machine or mask issues should be first addressed with the DME company, as they can often help better with technical and mask fit issues. Please ask if patient has a preference regarding DME company.  Please also make sure, the patient has a follow-up appointment with me in about 10 weeks from the setup date, thanks.  Once you have spoken to the patient - and faxed/routed report to PCP and referring MD (if other than PCP), you can close this  encounter, thanks,   Star Age, MD, PhD [facility] ([place])

## 2016-11-08 ENCOUNTER — Telehealth: Payer: Self-pay

## 2016-11-08 NOTE — Telephone Encounter (Signed)
I called pt. I advised pt that Dr. Rexene Alberts reviewed their sleep study results and found that pt did fairly well with the bipap ST during his latest sleep study. Dr. Rexene Alberts recommends that pt start a bipap at home. I reviewed PAP compliance expectations with the pt. Pt is agreeable to starting a BiPAP. I advised pt that an order will be sent to a DME, [facility], and [facility] will call the pt within about one week after they file with the pt's insurance. [facility] will show the pt how to use the machine, fit for masks, and troubleshoot the BiPAP if needed. A follow up appt was made for insurance purposes with Dr. Rexene Alberts on 01/25/2017 at 2:00pm. Pt verbalized understanding to arrive 15 minutes early and bring their BiPAP. A letter with all of this information in it will be mailed to the pt as a reminder. I verified with the pt that the address we have on file is correct. Pt verbalized understanding of results. Pt had no questions at this time but was encouraged to call back if questions arise.

## 2016-11-08 NOTE — Telephone Encounter (Signed)
-----   Message from Star Age, MD sent at 11/07/2016  8:41 AM EDT ----- Patient referred by Terri Piedra, PA, seen by me on 07/28/16, diagnostic PSG on 10/16/16, PAP titration on 11/03/16.     Please call and inform patient that I have entered an order for treatment with positive airway pressure (PAP) treatment of obstructive sleep apnea (OSA). He did fairly well during the latest sleep study with BiPAP ST, required a high pressure and back up rate to regulate sleep apnea and breathing as well as O2 sats. We will, therefore, arrange for a machine for home use through a DME (durable medical equipment) company of His choice; and I will see the patient back in follow-up in about 10 weeks. Please also explain to the patient that I will be looking out for compliance data, which can be downloaded from the machine (stored on an SD card, that is inserted in the machine) or via remote access through a modem, that is built into the machine. At the time of the followup appointment we will discuss sleep study results and how it is going with PAP treatment at home. Please advise patient to bring His machine at the time of the first FU visit, even though this is cumbersome. Bringing the machine for every visit after that will likely not be needed, but often helps for the first visit to troubleshoot if needed. Please re-enforce the importance of compliance with treatment and the need for [place] to monitor compliance data - often an insurance requirement and actually good feedback for the patient as far as how they are doing.  Also remind patient, that any interim PAP machine or mask issues should be first addressed with the DME company, as they can often help better with technical and mask fit issues. Please ask if patient has a preference regarding DME company.  Please also make sure, the patient has a follow-up appointment with me in about 10 weeks from the setup date, thanks.  Once you have spoken to the patient - and  faxed/routed report to PCP and referring MD (if other than PCP), you can close this encounter, thanks,   Star Age, MD, PhD [facility] ([place])

## 2016-11-16 DIAGNOSIS — G4733 Obstructive sleep apnea (adult) (pediatric): Secondary | ICD-10-CM | POA: Diagnosis not present

## 2016-11-16 DIAGNOSIS — R269 Unspecified abnormalities of gait and mobility: Secondary | ICD-10-CM | POA: Diagnosis not present

## 2016-11-18 ENCOUNTER — Inpatient Hospital Stay (HOSPITAL_COMMUNITY)
Admission: RE | Admit: 2016-11-18 | Discharge: 2016-11-18 | Disposition: A | Discharge status: Home / Self Care | Payer: Medicare HMO | Source: Ambulatory Visit

## 2016-11-18 NOTE — Pre-Procedure Instructions (Signed)
Harold [name]  11/18/2016      Harold [name] [facility] [address], [place] [address] Phone: [phone] Fax: [phone]  [place], [place] - [address] [place] [address] Phone: [phone] Fax: [phone]    Your procedure is scheduled on Friday, August 31.  Report to [facility] Admitting at 6:45 AM                  Your surgery or procedure is scheduled for 8:45 AM   Call this number if you have problems the morning of surgery: [phone]- pre- op desk                  For any other questions, please call [phone], Monday - Friday 8 AM - 4 PM.    Remember:  Do not eat food or drink liquids after midnight Thursday, August 30.  Take these medicines the morning of surgery with A SIP OF WATER :  1 Week prior to surgery STOP taking Aspirin, Aspirin Products (Goody Powder, Excedrin Migraine), Ibuprofen (Advil), Naproxen (Aleve), Vitamins and Herbal Products (ie Fish Oil)  How to Manage Your Diabetes Before and After Surgery  Why is it important to control my blood sugar before and after surgery? . Improving blood sugar levels before and after surgery helps healing and can limit problems. . A way of improving blood sugar control is eating a healthy diet by: o  Eating less sugar and carbohydrates o  Increasing activity/exercise o  Talking with your doctor about reaching your blood sugar goals . High blood sugars (greater than 180 mg/dL) can raise your risk of infections and slow your recovery, so you will need to focus on controlling your diabetes during the weeks before surgery. . Make sure that the doctor who takes care of your diabetes knows about your planned surgery including the date and location.  How do I manage my blood sugar before surgery? . Check your blood sugar at least 4 times a day, starting 2 days before surgery, to make sure  that the level is not too high or low. o Check your blood sugar the morning of your surgery when you wake up and every 2 hours until you get to the Short Stay unit. . If your blood sugar is less than 70 mg/dL, you will need to treat for low blood sugar: o Do not take insulin. o Treat a low blood sugar (less than 70 mg/dL) with  cup of clear juice (cranberry or apple), 4 glucose tablets, OR glucose gel. o Recheck blood sugar in 15 minutes after treatment (to make sure it is greater than 70 mg/dL). If your blood sugar is not greater than 70 mg/dL on recheck, call [phone] for further instructions. . Report your blood sugar to the short stay nurse when you get to Short Stay.  . If you are admitted to the hospital after surgery: o Your blood sugar will be checked by the staff and you will probably be given insulin after surgery (instead of oral diabetes medicines) to make sure you have good blood sugar levels. o The goal for blood sugar control after surgery is 80-180 mg/dL.            WHAT DO I DO ABOUT MY DIABETES MEDICATION?   [name] Do not take oral diabetes medicines (pills) the morning of surgery.  [name] THE  NIGHT BEFORE SURGERY, take ___________ units of ___________insulin.       [name] HE MORNING OF SURGERY, take _____________ units of __________insulin.  . The day of surgery, do not take other diabetes injectables, including Byetta (exenatide), Bydureon (exenatide ER), Victoza (liraglutide), or Trulicity (dulaglutide).  . If your CBG is greater than 220 mg/dL, you may take  of your sliding scale (correction) dose of insulin.  Other Instructions:          Patient Signature:  Date:   Nurse Signature:  Date:   Special Instructions:  [place]- Preparing For Surgery  Before surgery, you can play an important role. Because skin is not sterile, your skin needs to be as free of germs as possible. You can reduce the number of germs on your skin by washing with CHG  (chlorahexidine gluconate) Soap before surgery.  CHG is an antiseptic cleaner which kills germs and bonds with the skin to continue killing germs even after washing.  Please do not use if you have an allergy to CHG or antibacterial soaps. If your skin becomes reddened/irritated stop using the CHG.  Do not shave (including legs and underarms) for at least 48 hours prior to first CHG shower. It is OK to shave your face.  Please follow these instructions carefully.   1. Shower the NIGHT BEFORE SURGERY and the MORNING OF SURGERY with CHG.   2. If you chose to wash your hair, wash your hair first as usual with your normal shampoo.  3. After you shampoo, rinse your hair and body thoroughly to remove the shampoo.    Wash your face and private area with the soap you use at home, then rinse.  4. Use CHG as you would any other liquid soap. You can apply CHG directly to the skin and wash gently with a scrungie or a clean washcloth.   5. Apply the CHG Soap to your body ONLY FROM THE NECK DOWN.  Do not use on open wounds or open sores. Avoid contact with your eyes, ears, mouth and genitals (private parts). Wash genitals (private parts) with your normal soap.  6. Wash thoroughly, paying special attention to the area where your surgery will be performed.  7. Thoroughly rinse your body with warm water from the neck down.  8. DO NOT shower/wash with your normal soap after using and rinsing off the CHG Soap.  9. Pat yourself dry with a CLEAN TOWEL.   10. Wear CLEAN PAJAMAS   11. Place CLEAN SHEETS on your bed the night of your first shower and DO NOT SLEEP WITH PETS.  Day of Surgery: Shower as Above Do not apply any deodorants/lotions, powders, colognes. Please wear clean clothes to the hospital/surgery center.    Do not wear jewelry, make-up or nail polish.  Do not shave 48 hours prior to surgery.  Men may shave face and neck.  Do not bring valuables to the hospital.  [facility] is not responsible  for any belongings or valuables.  Contacts, dentures or bridgework may not be worn into surgery.  Leave your suitcase in the car.  After surgery it may be brought to your room.  For patients admitted to the hospital, discharge time will be determined by your treatment team.  Patients discharged the day of surgery will not be allowed to drive home.   Name and phone number of your driver:   -  Please read over the following fact sheets that you were given:  Coughing and Deep  Breathing and Pain Booklet, Surgical Site Infections.

## 2016-11-25 ENCOUNTER — Encounter (HOSPITAL_COMMUNITY): Admission: RE | Payer: Self-pay | Source: Ambulatory Visit

## 2016-11-25 ENCOUNTER — Ambulatory Visit (HOSPITAL_COMMUNITY): Admission: RE | Admit: 2016-11-25 | Payer: Medicare HMO | Source: Ambulatory Visit | Admitting: Oral Surgery

## 2016-11-25 SURGERY — DENTAL RESTORATION/EXTRACTIONS
Anesthesia: General

## 2016-12-13 ENCOUNTER — Encounter: Payer: Self-pay | Admitting: Sports Medicine

## 2016-12-13 ENCOUNTER — Ambulatory Visit (INDEPENDENT_AMBULATORY_CARE_PROVIDER_SITE_OTHER): Payer: Medicare HMO | Admitting: Sports Medicine

## 2016-12-13 DIAGNOSIS — M79672 Pain in left foot: Secondary | ICD-10-CM

## 2016-12-13 DIAGNOSIS — B359 Dermatophytosis, unspecified: Secondary | ICD-10-CM

## 2016-12-13 DIAGNOSIS — B351 Tinea unguium: Secondary | ICD-10-CM | POA: Diagnosis not present

## 2016-12-13 DIAGNOSIS — E119 Type 2 diabetes mellitus without complications: Secondary | ICD-10-CM | POA: Diagnosis not present

## 2016-12-13 DIAGNOSIS — L853 Xerosis cutis: Secondary | ICD-10-CM

## 2016-12-13 DIAGNOSIS — M79671 Pain in right foot: Secondary | ICD-10-CM | POA: Diagnosis not present

## 2016-12-13 NOTE — Progress Notes (Signed)
Subjective: Harold [name] is a 51 y.o. male patient with history of diabetes who returns to office for diabetic nail care; states that he is borderline now. Patient denies any new changes in medication or new problems.   Patient Active Problem List   Diagnosis Date Noted  . Thrush 08/02/2016  . Sleep disturbance 07/04/2016  . 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  . Urinary frequency 07/24/2015  . Trichomonas exposure 07/24/2015  . Type 2 diabetes mellitus ([place]) 03/04/2015  . Verruca vulgaris 02/24/2015  . Nocturia 02/17/2015  . Accessory skin tags 02/17/2015  . Sinusitis, acute 06/04/2014  . Lumbar radiculopathy, acute 05/23/2014  . Acute left-sided back pain with sciatica 04/10/2014  . Smoker 04/10/2014  . Rash 02/28/2014  . Cough 01/01/2014  . Grief reaction 11/19/2013  . Primary localized osteoarthrosis, lower leg 09/04/2013  . Chronic pain syndrome 07/11/2013  . Hyperlipidemia 07/11/2013  . 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 Gonalgia 11/29/2012  . Preventative health care 09/14/2012  . Allergic rhinitis 09/14/2012  . DDD (degenerative disc disease), lumbar 09/14/2012  . Left sided sciatica 09/14/2012  . Erectile dysfunction 09/14/2012  . Pre-ulcerative corn or callous 09/14/2012  . Hemorrhoids 07/26/2011  . Chronic back pain greater than 3 months duration 09/23/2010  . Abnormal EKG 08/12/2010  . OSA (obstructive sleep apnea) 08/09/2010  . 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 CHF (congestive heart failure) ([place]) 06/29/2010  . Edema 06/10/2010  . Acute idiopathic pericarditis 04/08/2010  . NONSPECIFIC ABNORMAL ELECTROCARDIOGRAM 04/08/2010  . HYPERTRIGLYCERIDEMIA 05/26/2009  . SHOULDER PAIN, LEFT 04/21/2008  . Depression 03/11/2008  . Essential hypertension 03/11/2008  . Gout, unspecified 05/01/2007  . METABOLIC SYNDROME X 40/98/1191  . OBESITY NOS 10/24/2006   Current Outpatient Prescriptions on File Prior to Visit  Medication Sig Dispense Refill  .  amLODipine (NORVASC) 10 MG tablet TAKE ONE TABLET BY MOUTH DAILY 90 tablet 1  . ammonium lactate (AMLACTIN) 12 % lotion Apply 1 application topically as needed for dry skin. 400 g 3  . aspirin EC 81 MG tablet Take 81 mg by mouth daily.    . Blood Glucose Monitoring Suppl (ACCU-CHEK NANO SMARTVIEW) W/DEVICE KIT Use glucose meter to check blood sugar 1-3 times daily as directed. 1 kit 0  . cetirizine (ZYRTEC) 5 MG tablet Take 5 mg by mouth daily as needed for allergies.     . citalopram (CELEXA) 40 MG tablet Take 1 tablet (40 mg total) by mouth daily. 90 tablet 0  . fenofibrate (TRICOR) 145 MG tablet TAKE ONE TABLET BY MOUTH DAILY. 90 tablet 0  . fentaNYL (DURAGESIC - DOSED MCG/HR) 50 MCG/HR Place 50 mcg onto the skin every 3 (three) days.    . fluconazole (DIFLUCAN) 100 MG tablet Take 100 mg by mouth every other day.    . flunisolide (NASALIDE) 25 MCG/ACT (0.025%) SOLN Place 2 sprays into the nose 2 (two) times daily. 1 Bottle 3  . furosemide (LASIX) 40 MG tablet TAKE ONE TABLET BY MOUTH DAILY 90 tablet 0  . gabapentin (NEURONTIN) 100 MG capsule Take 3 capsules (300 mg total) by mouth 3 (three) times daily. (Patient taking differently: Take 900 mg by mouth 3 (three) times daily. ) 90 capsule 3  . gabapentin (NEURONTIN) 300 MG capsule Take 900 mg by mouth 3 (three) times daily.    [name] glucose blood (ACCU-CHEK SMARTVIEW) test strip Use as instructed 100 each 12  . ibuprofen (ADVIL,MOTRIN) 800 MG tablet Take 800 mg by mouth every 8 (eight) hours as needed.    [name]  lisinopril (PRINIVIL,ZESTRIL) 20 MG tablet Take 1 tablet (20 mg total) by mouth 2 (two) times daily. 180 tablet 2  . lisinopril (PRINIVIL,ZESTRIL) 20 MG tablet TAKE ONE TABLET BY MOUTH TWICE A DAY 180 tablet 3  . metFORMIN (GLUCOPHAGE) 500 MG tablet TAKE ONE TABLET BY MOUTH TWICE A DAY WITH A MEAL 60 tablet 0  . Na Sulfate-K Sulfate-Mg Sulf (SUPREP BOWEL PREP KIT) 17.5-3.13-1.6 GM/180ML SOLN suprep as directed.  No substitutions 354 mL 0  . OVER  THE COUNTER MEDICATION Take 500 mg by mouth daily.    [name] oxyCODONE-acetaminophen (PERCOCET) 10-325 MG tablet Take 1 tablet by mouth every 6 (six) hours as needed for pain. 90 tablet 0  . Potassium Chloride ER 20 MEQ TBCR TAKE ONE TABLET BY MOUTH DAILY 90 tablet 0  . rosuvastatin (CRESTOR) 20 MG tablet TAKE 1 TABLET (20 MG TOTAL) BY MOUTH DAILY. 90 tablet 0  . sildenafil (REVATIO) 20 MG tablet Take 1-5 tablets (20-100 mg total) by mouth daily as needed. 50 tablet 2  . terbinafine (LAMISIL AT) 1 % cream Apply 1 application topically 2 (two) times daily. (Patient taking differently: Apply 1 application topically daily as needed (foot fungus). ) 36 g 3  . VOLTAREN 1 % GEL Apply 2 g topically 4 (four) times daily. 100 g 5  . [DISCONTINUED] amoxicillin-clarithromycin-lansoprazole (PREVPAC) combo pack Take by mouth 2 (two) times daily. Follow package directions. 1 kit 0   No current facility-administered medications on file prior to visit.    Allergies  Allergen Reactions  . Diphenhydramine Itching, Palpitations and Other (See Comments)    "jittery"  . Other Itching and Swelling    Pecans: itching and swelling of the tongue   . Colchicine Diarrhea  . Lipitor [Atorvastatin] Other (See Comments)    Muscle cramps  . Septra [Sulfamethoxazole-Trimethoprim] Itching    No results found for this or any previous visit (from the past 2160 hour(s)).  Objective: General: Patient is awake, alert, and oriented x 3 and in no acute distress.  Integument: Skin is warm, dry and supple bilateral. Nails are tender, long, thickened and dystrophic with subungual debris, consistent with onychomycosis, 1-5 bilateral. Scaly skin in annular fashion suggestive of tinea resolved with superimposed dry skin that is much improved. No acute signs of infection. No open lesions or preulcerative lesions present bilateral. Remaining integument unremarkable.  Vasculature:  Dorsalis Pedis pulse 2/4 bilateral. Posterior Tibial pulse   2/4 bilateral. Capillary fill time <3 sec 1-5 bilateral. Positive hair growth to the level of the digits.Temperature gradient within normal limits. No varicosities present bilateral. No edema present bilateral.   Neurology: The patient has intact sensation measured with a 5.07/10g Semmes Weinstein Monofilament at all pedal sites bilateral . Vibratory sensation intact bilateral with tuning fork. No Babinski sign present bilateral.   Musculoskeletal: No symptomatic pedal deformities noted bilateral. Muscular strength 5/5 in all lower extremity muscular groups bilateral without pain on range of motion. No tenderness with calf compression bilateral.  Assessment and Plan: Problem List Items Addressed This Visit    None    Visit Diagnoses    Dermatophytosis of nail    -  Primary   Dry skin       Tinea       Diabetes mellitus without complication (HCC)       Foot pain, bilateral         -Examined patient. -Discussed and educated patient on diabetic foot care, especially with regards to the vascular, neurological and musculoskeletal  systems.  -Stressed the importance of good glycemic control and the detriment of not controlling glucose levels in relation to the foot. -Mechanically debrided all nails 1-5 bilateral using sterile nail nipper and filed with dremel without incident  -Lamisil cream completed  -Cont with Amlactin lotion for dry skin  -Answered all patient questions -Patient to return  in 3 months for at risk foot care -Patient advised to call the office if any problems or questions arise in the meantime.  Landis Martins, DPM

## 2016-12-14 DIAGNOSIS — M25561 Pain in right knee: Secondary | ICD-10-CM | POA: Diagnosis not present

## 2016-12-14 DIAGNOSIS — M5416 Radiculopathy, lumbar region: Secondary | ICD-10-CM | POA: Diagnosis not present

## 2016-12-14 DIAGNOSIS — M545 Low back pain: Secondary | ICD-10-CM | POA: Diagnosis not present

## 2016-12-15 DIAGNOSIS — M5416 Radiculopathy, lumbar region: Secondary | ICD-10-CM | POA: Diagnosis not present

## 2016-12-17 DIAGNOSIS — R269 Unspecified abnormalities of gait and mobility: Secondary | ICD-10-CM | POA: Diagnosis not present

## 2016-12-17 DIAGNOSIS — G4733 Obstructive sleep apnea (adult) (pediatric): Secondary | ICD-10-CM | POA: Diagnosis not present

## 2016-12-19 ENCOUNTER — Encounter: Payer: Self-pay | Admitting: Family Medicine

## 2016-12-19 ENCOUNTER — Ambulatory Visit (INDEPENDENT_AMBULATORY_CARE_PROVIDER_SITE_OTHER): Payer: Medicare HMO | Admitting: Family Medicine

## 2016-12-19 VITALS — BP 158/92 | HR 81 | Temp 98.6°F | Ht 71.0 in | Wt 263.8 lb

## 2016-12-19 DIAGNOSIS — Z0271 Encounter for disability determination: Secondary | ICD-10-CM

## 2016-12-19 DIAGNOSIS — R0981 Nasal congestion: Secondary | ICD-10-CM | POA: Diagnosis not present

## 2016-12-19 NOTE — Progress Notes (Signed)
Harold [name] - 51 y.o. male MRN [id number]  Date of birth: [birth year]  SUBJECTIVE:  Including CC & ROS.  Chief Complaint  Patient presents with  . Sinusitis    Patient is here today C/O sinus pressure left sided x4d.  States that it is now sore in his gums.    Harold [name] is a 51 year old male that is presenting with sinus congestion. He reports his symptoms have been present for 4 days. He is having some sinus pressure. He reports having a fever but has not taken his temperature since last Wednesday. He is not having any tooth pain. He has been around his grandchildren recently.   Review of his A1c from 07/04/16 shows 7.6.  Review of Systems  HENT: Positive for sinus pressure. Negative for trouble swallowing.   Respiratory: Negative for shortness of breath.   Cardiovascular: Negative for chest pain.    HISTORY: Past Medical, Surgical, Social, and Family History Reviewed & Updated per EMR.   Pertinent Historical Findings include:  Past Medical History:  Diagnosis Date  . Acute pericarditis    admx 03-26-10 thru 03-29-10; a. echo 03-26-10: EF 55-60%; mild LVH; trivial MR; RVF ok; mild -mod circumferential Eff w/o tamponade  . Allergic rhinitis, cause unspecified 09/14/2012  . Allergy   . Anxiety   . Chronic back pain   . DDD (degenerative disc disease), lumbar 09/14/2012  . Depression   . Diabetes mellitus without complication (HCC)    borderline   . Erectile dysfunction 09/14/2012  . GERD (gastroesophageal reflux disease)   . Gout    pt denies  . Headache    migraines in the past  . History of pancreatitis    a. admx 04-2009.Marland KitchenMarland Kitchen? 2-2 triglycerides  . Hyperlipidemia 07/11/2013  . Hypertension   . Hypertriglyceridemia    a. followed by [facility]  . Left sided sciatica 09/14/2012  . Metabolic syndrome   . Obesity   . OSA (obstructive sleep apnea)    does not use cpap  . Restless legs     Past Surgical History:  Procedure Laterality Date  . COLONOSCOPY    . KNEE  SURGERY     right  . LUMBAR LAMINECTOMY/DECOMPRESSION MICRODISCECTOMY N/A 08/26/2015   Procedure: Lumbar Two-Sacral One  Laminectomy for decompression;  Surgeon: [name], MD;  Location: [place];  Service: Neurosurgery;  Laterality: N/A;  . pericardectomy  07/15/2010   Dr. [name]  . pleurx catheter placement  07/15/2010   Dr. [name]  . SHOULDER SURGERY     right and left shoulders  . TONSILLECTOMY AND ADENOIDECTOMY     one tonsil    Allergies  Allergen Reactions  . Diphenhydramine Itching, Palpitations and Other (See Comments)    "jittery"  . Other Itching and Swelling    Pecans: itching and swelling of the tongue   . Colchicine Diarrhea  . Lipitor [Atorvastatin] Other (See Comments)    Muscle cramps  . Septra [Sulfamethoxazole-Trimethoprim] Itching    Family History  Problem Relation Age of Onset  . Diabetes Mother   . Diabetes Father   . Colon cancer Father 21  . Heart disease Father 69       CABG  . Pancreatitis Father   . Hypertension Other        entire family  . Liver cancer Paternal Uncle   . Diabetes Sister   . Stomach cancer Maternal Grandfather   . Pancreatitis Paternal Uncle        x  2  . Esophageal cancer Neg Hx   . Rectal cancer Neg Hx      Social History   Social History  . Marital status: Divorced    Spouse name: N/A  . Number of children: 1  . Years of education: N/A   Occupational History  . disabled Unemployed   Social History Main Topics  . Smoking status: Current Every Day Smoker    Packs/day: 0.50    Years: 20.00    Types: Cigarettes  . Smokeless tobacco: Never Used  . Alcohol use 1.2 oz/week    2 Shots of liquor per week     Comment: occasional   . Drug use: No  . Sexual activity: Not on file   Other Topics Concern  . Not on file   Social History Narrative   Regular exercise- No   Rare caffeine use      PHYSICAL EXAM:  VS: BP (!) 158/92 (BP Location: Left Arm, Patient Position: Sitting, Cuff Size:  Large)   Pulse 81   Temp 98.6 F (37 C) (Oral)   Ht 5\' 11"  (1.803 m)   Wt 263 lb 12.8 oz (119.7 kg)   SpO2 93%   BMI 36.79 kg/m  Physical Exam Gen: NAD, alert, cooperative with exam,  ENT: normal lips, normal nasal mucosa, tympanic membranes clear and intact bilaterally, edematous nasal turbinates, no cervical lymphadenopathy, oropharynx clear Eye: normal EOM, normal conjunctiva and lids CV:  no edema, +2 pedal pulses, S1-S2, regular rate and rhythm   Resp: no accessory muscle use, non-labored, clear to auscultation bilaterally, no wheezes or crackles Skin: no rashes, no areas of induration  Neuro: normal tone, normal sensation to touch Psych:  normal insight, alert and oriented MSK: Normal gait, normal strength      ASSESSMENT & PLAN:   Sinus congestion His symptoms seem to be consistent with a viral infection - Continue supportive care. - Can call back later this week if his symptoms are ongoing. Avoiding steroids as he has uncontrolled diabetes.

## 2016-12-19 NOTE — Assessment & Plan Note (Addendum)
His symptoms seem to be consistent with a viral infection - Continue supportive care. - Can call back later this week if his symptoms are ongoing. Avoiding steroids as he has uncontrolled diabetes.

## 2016-12-19 NOTE — Patient Instructions (Signed)
Thank you for coming in,   Please try things such as zyrtec-D or allegra-D which is an antihistamine and decongestant.   Please try afrin which will help with nasal congestion but use for only three days.   Please also try using a netti pot on a regular occasion.  Honey can help with a sore throat.   Please call me back later this week if you don't have any improvement of your symptoms.    Please feel free to call with any questions or concerns at any time, at [phone]. --Dr. Raeford Razor

## 2016-12-20 ENCOUNTER — Encounter: Payer: Self-pay | Admitting: Family

## 2016-12-20 ENCOUNTER — Ambulatory Visit (INDEPENDENT_AMBULATORY_CARE_PROVIDER_SITE_OTHER): Payer: Medicare HMO | Admitting: Family

## 2016-12-20 VITALS — BP 138/88 | HR 71 | Temp 98.0°F | Resp 16 | Ht 71.0 in

## 2016-12-20 DIAGNOSIS — Z23 Encounter for immunization: Secondary | ICD-10-CM | POA: Diagnosis not present

## 2016-12-20 DIAGNOSIS — R0981 Nasal congestion: Secondary | ICD-10-CM | POA: Diagnosis not present

## 2016-12-20 MED ORDER — METHYLPREDNISOLONE ACETATE 80 MG/ML IJ SUSP
80.0000 mg | Freq: Once | INTRAMUSCULAR | Status: AC
  Administered 2016-12-20: 80 mg via INTRAMUSCULAR

## 2016-12-20 NOTE — Assessment & Plan Note (Signed)
Symptoms and exam consistent with sinus congestion not responsive to OTC medications. In office injection of 80 mg of DepoMedrol provided. Continue OTC medications as needed for symptom relief and supportive care. Follow up if symptoms worsen or do not improve.

## 2016-12-20 NOTE — Patient Instructions (Addendum)
Thank you for choosing [facility].  SUMMARY AND INSTRUCTIONS:  Please continue to take your over the counter medications as needed for symptom relief.  Monitor your blood sugars with the prednisone injection.  If your symptoms worsen please let us know.  Follow up:  If your symptoms worsen or fail to improve, please contact our office for further instruction, or in case of emergency go directly to the emergency room at the closest medical facility.     General Recommendations:    Please drink plenty of fluids.  Get plenty of rest   Sleep in humidified air  Use saline nasal sprays  Netti pot   OTC Medications:  Decongestants - helps relieve congestion   Flonase (generic fluticasone) or Nasacort (generic triamcinolone) - please make sure to use the "cross-over" technique at a 45 degree angle towards the opposite eye as opposed to straight up the nasal passageway.   Sudafed (generic pseudoephedrine - Note this is the one that is available behind the pharmacy counter); Products with phenylephrine (-PE) may also be used but is often not as effective as pseudoephedrine.   If you have HIGH BLOOD PRESSURE - Coricidin HBP; AVOID any product that is -D as this contains pseudoephedrine which may increase your blood pressure.  Afrin (oxymetazoline) every 6-8 hours for up to 3 days.   Allergies - helps relieve runny nose, itchy eyes and sneezing   Claritin (generic loratidine), Allegra (fexofenidine), or Zyrtec (generic cyrterizine) for runny nose. These medications should not cause drowsiness.  Note - Benadryl (generic diphenhydramine) may be used however may cause drowsiness  Cough -   Delsym or Robitussin (generic dextromethorphan)  Expectorants - helps loosen mucus to ease removal   Mucinex (generic guaifenesin) as directed on the package.  Headaches / General Aches   Tylenol (generic acetaminophen) - DO NOT EXCEED 3 grams (3,000 mg) in a 24 hour time  period  Advil/Motrin (generic ibuprofen)   Sore Throat -   Salt water gargle   Chloraseptic (generic benzocaine) spray or lozenges / Sucrets (generic dyclonine)    Sinusitis Sinusitis is redness, soreness, and inflammation of the paranasal sinuses. Paranasal sinuses are air pockets within the bones of your face (beneath the eyes, the middle of the forehead, or above the eyes). In healthy paranasal sinuses, mucus is able to drain out, and air is able to circulate through them by way of your nose. However, when your paranasal sinuses are inflamed, mucus and air can become trapped. This can allow bacteria and other germs to grow and cause infection. Sinusitis can develop quickly and last only a short time (acute) or continue over a long period (chronic). Sinusitis that lasts for more than 12 weeks is considered chronic.  CAUSES  Causes of sinusitis include:  Allergies.  Structural abnormalities, such as displacement of the cartilage that separates your nostrils (deviated septum), which can decrease the air flow through your nose and sinuses and affect sinus drainage.  Functional abnormalities, such as when the small hairs (cilia) that line your sinuses and help remove mucus do not work properly or are not present. SIGNS AND SYMPTOMS  Symptoms of acute and chronic sinusitis are the same. The primary symptoms are pain and pressure around the affected sinuses. Other symptoms include:  Upper toothache.  Earache.  Headache.  Bad breath.  Decreased sense of smell and taste.  A cough, which worsens when you are lying flat.  Fatigue.  Fever.  Thick drainage from your nose, which often is green  and may contain pus (purulent).  Swelling and warmth over the affected sinuses. DIAGNOSIS  Your health care provider will perform a physical exam. During the exam, your health care provider may:  Look in your nose for signs of abnormal growths in your nostrils (nasal polyps).  Tap over  the affected sinus to check for signs of infection.  View the inside of your sinuses (endoscopy) using an imaging device that has a light attached (endoscope). If your health care provider suspects that you have chronic sinusitis, one or more of the following tests may be recommended:  Allergy tests.  Nasal culture. A sample of mucus is taken from your nose, sent to a lab, and screened for bacteria.  Nasal cytology. A sample of mucus is taken from your nose and examined by your health care provider to determine if your sinusitis is related to an allergy. TREATMENT  Most cases of acute sinusitis are related to a viral infection and will resolve on their own within 10 days. Sometimes medicines are prescribed to help relieve symptoms (pain medicine, decongestants, nasal steroid sprays, or saline sprays).  However, for sinusitis related to a bacterial infection, your health care provider will prescribe antibiotic medicines. These are medicines that will help kill the bacteria causing the infection.  Rarely, sinusitis is caused by a fungal infection. In theses cases, your health care provider will prescribe antifungal medicine. For some cases of chronic sinusitis, surgery is needed. Generally, these are cases in which sinusitis recurs more than 3 times per year, despite other treatments. HOME CARE INSTRUCTIONS   Drink plenty of water. Water helps thin the mucus so your sinuses can drain more easily.  Use a humidifier.  Inhale steam 3 to 4 times a day (for example, sit in the bathroom with the shower running).  Apply a warm, moist washcloth to your face 3 to 4 times a day, or as directed by your health care provider.  Use saline nasal sprays to help moisten and clean your sinuses.  Take medicines only as directed by your health care provider.  If you were prescribed either an antibiotic or antifungal medicine, finish it all even if you start to feel better. SEEK IMMEDIATE MEDICAL CARE  IF:  You have increasing pain or severe headaches.  You have nausea, vomiting, or drowsiness.  You have swelling around your face.  You have vision problems.  You have a stiff neck.  You have difficulty breathing. MAKE SURE YOU:   Understand these instructions.  Will watch your condition.  Will get help right away if you are not doing well or get worse. Document Released: 03/14/2005 Document Revised: 07/29/2013 Document Reviewed: 03/29/2011 [facility] Patient Information 2015 [place], [place]. This information is not intended to replace advice given to you by your health care provider. Make sure you discuss any questions you have with your health care provider.

## 2016-12-20 NOTE — Progress Notes (Signed)
Subjective:    Patient ID: Harold [name], male    DOB: September 21, 1965, 51 y.o.   MRN: [id number]  Chief Complaint  Patient presents with  . Nasal Congestion    sinus congestion x5 days    HPI:  Harold [name] is a 51 y.o. male who  has a past medical history of Acute pericarditis; Allergic rhinitis, cause unspecified (09/14/2012); Allergy; Anxiety; Chronic back pain; DDD (degenerative disc disease), lumbar (09/14/2012); Depression; Diabetes mellitus without complication ([place]); Erectile dysfunction (09/14/2012); GERD (gastroesophageal reflux disease); Gout; Headache; History of pancreatitis; Hyperlipidemia (07/11/2013); Hypertension; Hypertriglyceridemia; Left sided sciatica (09/14/2012); Metabolic syndrome; Obesity; OSA (obstructive sleep apnea); and Restless legs. and presents today for an acute office visit.  Evaluated in the past 24 hours with similar symptoms and diagnosed with sinus congestion believed to be viral with instructions to try Zyrtec-D or Allegra D. Associated symptom of congestion, drainage and pressure that has been going on for about 5 days. Modifying factors include Sudafed, Zyrtec and Tylenol cold/sinus which have helped a little. Has also used nasal spray and saline. No fevers. Describes itchy, water eyes. Cough is productive and described as yellowish-green. Course of the symptoms have stayed about the same with timing of the symptoms worse at night. Left side of the face is worse. No dental pain.   Allergies  Allergen Reactions  . Diphenhydramine Itching, Palpitations and Other (See Comments)    "jittery"  . Other Itching and Swelling    Pecans: itching and swelling of the tongue   . Colchicine Diarrhea  . Lipitor [Atorvastatin] Other (See Comments)    Muscle cramps  . Septra [Sulfamethoxazole-Trimethoprim] Itching      Outpatient Medications Prior to Visit  Medication Sig Dispense Refill  . amLODipine (NORVASC) 10 MG tablet TAKE ONE TABLET BY MOUTH DAILY 90  tablet 1  . ammonium lactate (AMLACTIN) 12 % lotion Apply 1 application topically as needed for dry skin. 400 g 3  . aspirin EC 81 MG tablet Take 81 mg by mouth daily.    . Blood Glucose Monitoring Suppl (ACCU-CHEK NANO SMARTVIEW) W/DEVICE KIT Use glucose meter to check blood sugar 1-3 times daily as directed. 1 kit 0  . cetirizine (ZYRTEC) 5 MG tablet Take 5 mg by mouth daily as needed for allergies.     . citalopram (CELEXA) 40 MG tablet Take 1 tablet (40 mg total) by mouth daily. 90 tablet 0  . fenofibrate (TRICOR) 145 MG tablet TAKE ONE TABLET BY MOUTH DAILY. 90 tablet 0  . fentaNYL (DURAGESIC - DOSED MCG/HR) 50 MCG/HR Place 50 mcg onto the skin every 3 (three) days.    . fluconazole (DIFLUCAN) 100 MG tablet Take 100 mg by mouth every other day.    . flunisolide (NASALIDE) 25 MCG/ACT (0.025%) SOLN Place 2 sprays into the nose 2 (two) times daily. 1 Bottle 3  . furosemide (LASIX) 40 MG tablet TAKE ONE TABLET BY MOUTH DAILY 90 tablet 0  . gabapentin (NEURONTIN) 300 MG capsule Take 900 mg by mouth 3 (three) times daily.    [name] glucose blood (ACCU-CHEK SMARTVIEW) test strip Use as instructed 100 each 12  . ibuprofen (ADVIL,MOTRIN) 800 MG tablet Take 800 mg by mouth every 8 (eight) hours as needed.    [name] lisinopril (PRINIVIL,ZESTRIL) 20 MG tablet TAKE ONE TABLET BY MOUTH TWICE A DAY 180 tablet 3  . metFORMIN (GLUCOPHAGE) 500 MG tablet TAKE ONE TABLET BY MOUTH TWICE A DAY WITH A MEAL 60 tablet 0  . Na  Sulfate-K Sulfate-Mg Sulf (SUPREP BOWEL PREP KIT) 17.5-3.13-1.6 GM/180ML SOLN suprep as directed.  No substitutions 354 mL 0  . OVER THE COUNTER MEDICATION Take 500 mg by mouth daily.    [name] oxyCODONE-acetaminophen (PERCOCET) 10-325 MG tablet Take 1 tablet by mouth every 6 (six) hours as needed for pain. 90 tablet 0  . Potassium Chloride ER 20 MEQ TBCR TAKE ONE TABLET BY MOUTH DAILY 90 tablet 0  . rosuvastatin (CRESTOR) 20 MG tablet TAKE 1 TABLET (20 MG TOTAL) BY MOUTH DAILY. 90 tablet 0  . sildenafil  (REVATIO) 20 MG tablet Take 1-5 tablets (20-100 mg total) by mouth daily as needed. 50 tablet 2  . terbinafine (LAMISIL AT) 1 % cream Apply 1 application topically 2 (two) times daily. (Patient taking differently: Apply 1 application topically daily as needed (foot fungus). ) 36 g 3  . VOLTAREN 1 % GEL Apply 2 g topically 4 (four) times daily. 100 g 5   No facility-administered medications prior to visit.     Past Medical History:  Diagnosis Date  . Acute pericarditis    admx 03-26-10 thru 03-29-10; a. echo 03-26-10: EF 55-60%; mild LVH; trivial MR; RVF ok; mild -mod circumferential Eff w/o tamponade  . Allergic rhinitis, cause unspecified 09/14/2012  . Allergy   . Anxiety   . Chronic back pain   . DDD (degenerative disc disease), lumbar 09/14/2012  . Depression   . Diabetes mellitus without complication ([facility])    borderline   . Erectile dysfunction 09/14/2012  . GERD (gastroesophageal reflux disease)   . Gout    pt denies  . Headache    migraines in the past  . History of pancreatitis    a. admx 04-2009.Marland KitchenMarland Kitchen? 2-2 triglycerides  . Hyperlipidemia 07/11/2013  . Hypertension   . Hypertriglyceridemia    a. followed by [facility]  . Left sided sciatica 09/14/2012  . Metabolic syndrome   . Obesity   . OSA (obstructive sleep apnea)    does not use cpap  . Restless legs       Review of Systems  Constitutional: Negative for chills and fever.  HENT: Positive for congestion, ear pain, sinus pain, sinus pressure and sore throat. Negative for trouble swallowing.   Respiratory: Positive for cough. Negative for chest tightness, shortness of breath and wheezing.   Neurological: Positive for headaches.      Objective:    BP 138/88 (BP Location: Left Arm, Patient Position: Sitting, Cuff Size: Large)   Pulse 71   Temp 98 F (36.7 C) (Oral)   Resp 16   Ht _0  (1.803 m)   SpO2 97%   BMI 36.79 kg/m  Nursing note and vital signs reviewed.  Physical Exam  Constitutional: He is  oriented to person, place, and time. He appears well-developed and well-nourished. No distress.  HENT:  Right Ear: Hearing, tympanic membrane, external ear and ear canal normal.  Left Ear: Hearing, tympanic membrane, external ear and ear canal normal.  Nose: Right sinus exhibits no maxillary sinus tenderness and no frontal sinus tenderness. Left sinus exhibits maxillary sinus tenderness and frontal sinus tenderness.  Mouth/Throat: Uvula is midline, oropharynx is clear and moist and mucous membranes are normal.  Neck: Neck supple.  Cardiovascular: Normal rate, regular rhythm, normal heart sounds and intact distal pulses.   Pulmonary/Chest: Effort normal and breath sounds normal.  Neurological: He is alert and oriented to person, place, and time.  Skin: Skin is warm and dry.  Psychiatric: He has a  normal mood and affect. His behavior is normal. Judgment and thought content normal.       Assessment & Plan:   Problem List Items Addressed This Visit      Respiratory   Sinus congestion - Primary    Symptoms and exam consistent with sinus congestion not responsive to OTC medications. In office injection of 80 mg of DepoMedrol provided. Continue OTC medications as needed for symptom relief and supportive care. Follow up if symptoms worsen or do not improve.       Relevant Medications   methylPREDNISolone acetate (DEPO-MEDROL) injection 80 mg (Completed)    Other Visit Diagnoses    Need for influenza vaccination       Relevant Orders   Flu Vaccine QUAD 36+ mos IM (Completed)       I am having Mr. Guttman maintain his VOLTAREN, ACCU-CHEK NANO SMARTVIEW, glucose blood, cetirizine, oxyCODONE-acetaminophen, ibuprofen, ammonium lactate, terbinafine, amLODipine, sildenafil, metFORMIN, flunisolide, aspirin EC, Na Sulfate-K Sulfate-Mg Sulf, citalopram, rosuvastatin, fluconazole, gabapentin, fentaNYL, OVER THE COUNTER MEDICATION, lisinopril, Potassium Chloride ER, furosemide, and fenofibrate. We  administered methylPREDNISolone acetate.   Meds ordered this encounter  Medications  . methylPREDNISolone acetate (DEPO-MEDROL) injection 80 mg     Follow-up: Return if symptoms worsen or fail to improve.  Mauricio Po, FNP

## 2016-12-29 DIAGNOSIS — M5416 Radiculopathy, lumbar region: Secondary | ICD-10-CM | POA: Diagnosis not present

## 2016-12-29 DIAGNOSIS — M545 Low back pain: Secondary | ICD-10-CM | POA: Diagnosis not present

## 2017-01-02 ENCOUNTER — Other Ambulatory Visit: Payer: Self-pay | Admitting: Anesthesiology

## 2017-01-02 ENCOUNTER — Ambulatory Visit
Admission: RE | Admit: 2017-01-02 | Discharge: 2017-01-02 | Disposition: A | Discharge status: Home / Self Care | Payer: Medicare HMO | Source: Ambulatory Visit | Attending: Anesthesiology | Admitting: Anesthesiology

## 2017-01-02 DIAGNOSIS — M5126 Other intervertebral disc displacement, lumbar region: Secondary | ICD-10-CM | POA: Diagnosis not present

## 2017-01-02 DIAGNOSIS — M545 Low back pain: Secondary | ICD-10-CM

## 2017-01-02 MED ORDER — GADOBENATE DIMEGLUMINE 529 MG/ML IV SOLN
20.0000 mL | Freq: Once | INTRAVENOUS | Status: AC | PRN
  Administered 2017-01-02: 20 mL via INTRAVENOUS

## 2017-01-03 ENCOUNTER — Other Ambulatory Visit: Payer: Self-pay | Admitting: Family

## 2017-01-04 DIAGNOSIS — M545 Low back pain: Secondary | ICD-10-CM | POA: Diagnosis not present

## 2017-01-04 DIAGNOSIS — M5416 Radiculopathy, lumbar region: Secondary | ICD-10-CM | POA: Diagnosis not present

## 2017-01-06 ENCOUNTER — Other Ambulatory Visit: Payer: Self-pay | Admitting: Family

## 2017-01-12 DIAGNOSIS — M5416 Radiculopathy, lumbar region: Secondary | ICD-10-CM | POA: Diagnosis not present

## 2017-01-12 MED ORDER — CEFAZOLIN SODIUM 10 G IJ SOLR
3.0000 g | INTRAMUSCULAR | Status: DC
  Filled 2017-01-12: qty 3000

## 2017-01-13 ENCOUNTER — Encounter (HOSPITAL_COMMUNITY): Admission: RE | Payer: Self-pay | Source: Ambulatory Visit

## 2017-01-13 ENCOUNTER — Ambulatory Visit (HOSPITAL_COMMUNITY): Admission: RE | Admit: 2017-01-13 | Payer: Medicare HMO | Source: Ambulatory Visit | Admitting: Oral Surgery

## 2017-01-13 SURGERY — DENTAL RESTORATION/EXTRACTIONS
Anesthesia: General | Laterality: Bilateral

## 2017-01-16 DIAGNOSIS — R269 Unspecified abnormalities of gait and mobility: Secondary | ICD-10-CM | POA: Diagnosis not present

## 2017-01-16 DIAGNOSIS — G4733 Obstructive sleep apnea (adult) (pediatric): Secondary | ICD-10-CM | POA: Diagnosis not present

## 2017-01-18 ENCOUNTER — Encounter: Payer: Self-pay | Admitting: Nurse Practitioner

## 2017-01-18 ENCOUNTER — Other Ambulatory Visit (INDEPENDENT_AMBULATORY_CARE_PROVIDER_SITE_OTHER): Payer: Medicare HMO

## 2017-01-18 ENCOUNTER — Ambulatory Visit (INDEPENDENT_AMBULATORY_CARE_PROVIDER_SITE_OTHER): Payer: Medicare HMO | Admitting: Nurse Practitioner

## 2017-01-18 VITALS — BP 162/90 | HR 67 | Temp 98.5°F | Resp 16 | Ht 71.0 in | Wt 268.0 lb

## 2017-01-18 DIAGNOSIS — R350 Frequency of micturition: Secondary | ICD-10-CM | POA: Diagnosis not present

## 2017-01-18 DIAGNOSIS — J069 Acute upper respiratory infection, unspecified: Secondary | ICD-10-CM

## 2017-01-18 DIAGNOSIS — Z6837 Body mass index (BMI) 37.0-37.9, adult: Secondary | ICD-10-CM | POA: Diagnosis not present

## 2017-01-18 DIAGNOSIS — R351 Nocturia: Secondary | ICD-10-CM | POA: Diagnosis not present

## 2017-01-18 DIAGNOSIS — I10 Essential (primary) hypertension: Secondary | ICD-10-CM | POA: Diagnosis not present

## 2017-01-18 DIAGNOSIS — E119 Type 2 diabetes mellitus without complications: Secondary | ICD-10-CM

## 2017-01-18 LAB — POCT URINALYSIS DIPSTICK
Bilirubin, UA: NEGATIVE
Blood, UA: NEGATIVE
Glucose, UA: NEGATIVE
Ketones, UA: NEGATIVE
Leukocytes, UA: NEGATIVE
Nitrite, UA: NEGATIVE
Protein, UA: NEGATIVE
Spec Grav, UA: 1.025 (ref 1.010–1.025)
Urobilinogen, UA: NEGATIVE E.U./dL — AB
pH, UA: 6 (ref 5.0–8.0)

## 2017-01-18 LAB — BASIC METABOLIC PANEL
BUN: 14 mg/dL (ref 6–23)
CO2: 28 mEq/L (ref 19–32)
Calcium: 10 mg/dL (ref 8.4–10.5)
Chloride: 102 mEq/L (ref 96–112)
Creatinine, Ser: 1.18 mg/dL (ref 0.40–1.50)
GFR: 84.21 mL/min (ref >60.00)
Glucose, Bld: 109 mg/dL — ABNORMAL HIGH (ref 70–99)
Potassium: 4 mEq/L (ref 3.5–5.1)
Sodium: 139 mEq/L (ref 135–145)

## 2017-01-18 LAB — HEMOGLOBIN A1C: Hgb A1c MFr Bld: 6.9 % — ABNORMAL HIGH (ref 4.6–6.5)

## 2017-01-18 LAB — PSA: PSA: 2.15 ng/mL (ref 0.10–4.00)

## 2017-01-18 MED ORDER — TOLTERODINE TARTRATE 2 MG PO TABS: 2.0000 mg | ORAL_TABLET | Freq: Two times a day (BID) | ORAL | 1 refills | Status: DC

## 2017-01-18 MED ORDER — BENZONATATE 100 MG PO CAPS: 100.0000 mg | ORAL_CAPSULE | Freq: Two times a day (BID) | ORAL | 0 refills | Status: DC | PRN

## 2017-01-18 NOTE — Patient Instructions (Addendum)
Head downstairs for lab work.  I have sent a prescription for detrol to your pharmacy for your urinary frequency called detrol. Take 1 tablet twice daily.  For your cold symptoms: Saline nasal spray used frequently. For drainage may use Allegra, Claritin or Zyrtec. Ibuprofen 600 mg every 6 hours as needed for pain, discomfort or fever. Drink plenty of fluids and stay well-hydrated. Please follow up if your symptoms get worse, you develop fevers over 101, or if you are not feeling better by the beginning of next week,   I have placed a referral to surgery to discuss weight loss options for you. Our office will call you to set up that appointment.  I'd like to see you back next month to recheck your blood pressure.  It was nice to meet you. Thanks for letting me take care of you today :)  Upper Respiratory Infection, Adult Most upper respiratory infections (URIs) are caused by a virus. A URI affects the nose, throat, and upper air passages. The most common type of URI is often called "the common cold." Follow these instructions at home:  Take medicines only as told by your doctor.  Gargle warm saltwater or take cough drops to comfort your throat as told by your doctor.  Use a warm mist humidifier or inhale steam from a shower to increase air moisture. This may make it easier to breathe.  Drink enough fluid to keep your pee (urine) clear or pale yellow.  Eat soups and other clear broths.  Have a healthy diet.  Rest as needed.  Go back to work when your fever is gone or your doctor says it is okay. ? You may need to stay home longer to avoid giving your URI to others. ? You can also wear a face mask and wash your hands often to prevent spread of the virus.  Use your inhaler more if you have asthma.  Do not use any tobacco products, including cigarettes, chewing tobacco, or electronic cigarettes. If you need help quitting, ask your doctor. Contact a doctor if:  You are getting  worse, not better.  Your symptoms are not helped by medicine.  You have chills.  You are getting more short of breath.  You have brown or red mucus.  You have yellow or brown discharge from your nose.  You have pain in your face, especially when you bend forward.  You have a fever.  You have puffy (swollen) neck glands.  You have pain while swallowing.  You have white areas in the back of your throat. Get help right away if:  You have very bad or constant: ? Headache. ? Ear pain. ? Pain in your forehead, behind your eyes, and over your cheekbones (sinus pain). ? Chest pain.  You have long-lasting (chronic) lung disease and any of the following: ? Wheezing. ? Long-lasting cough. ? Coughing up blood. ? A change in your usual mucus.  You have a stiff neck.  You have changes in your: ? Vision. ? Hearing. ? Thinking. ? Mood. This information is not intended to replace advice given to you by your health care provider. Make sure you discuss any questions you have with your health care provider. Document Released: 08/31/2007 Document Revised: 11/15/2015 Document Reviewed: 06/19/2013 Elsevier Interactive Patient Education  2018 [employer].

## 2017-01-18 NOTE — Assessment & Plan Note (Signed)
He has difficulty exercising d/t chronic back pain. hed like to lose weight to improve his blood pressure, diabetes, and overall state of health. He is requesting surgical consult for lap-band. Referral placed

## 2017-01-18 NOTE — Assessment & Plan Note (Signed)
Blood pressure elevated above goal 140/90. Patient reports recent use of psuedoephedrine products for cough/cold and not feeling well. He does check his blood pressure at home and it has not been above 140/90. He will continue to monitor BP at home and return in 1 month for a re-check.

## 2017-01-18 NOTE — Progress Notes (Signed)
Subjective:    Patient ID: Harold [name], male    DOB: September 30, 1965, 51 y.o.   MRN: [id number]  HPI Harold [name] is a 51 yo male who presents today to establish care. He is transferring to me from another provider in the same clinic.  Nocturia- Reports frequent urination at night. This problem has been increasingly worse for the past several weeks, unsure exactly when it started. He is maintained on lasix orally every morning for CHF but has not had a recent dose change and has not experienced this problem in the past with lasix. Hes tried avoiding fluids for at least one hour before bed with no improvment. He reports urgency. He denies abdominal pain, flank pain, nausea, vomiting,dysuria, hematuria, urinary hesitancy, penile discharge. He checks his blood sugar every other day at home with readings in the 90's-100s.  Cough/cold- Onset 4 days ago. He initially had a low fever but that has resolved. He reports headaches, facial pressure, left ear pressure, congestion, sore throat, cough with green sputum. He denies malaise, lymphadenopathy, fevers, chest pain, shortness of breath. Hes tried otc cough/cold medication including psuedoephedrine with some relief of sinus pressure.  Obesity- He would like to lose weight but has difficulty exercising due to chronic back pain. he'd like to see if he is a candidate for lap-band or other weight loss surgery.  Review of Systems  See HPI  Past Medical History:  Diagnosis Date  . Acute pericarditis    admx 03-26-10 thru 03-29-10; a. echo 03-26-10: EF 55-60%; mild LVH; trivial MR; RVF ok; mild -mod circumferential Eff w/o tamponade  . Allergic rhinitis, cause unspecified 09/14/2012  . Allergy   . Anxiety   . Chronic back pain   . DDD (degenerative disc disease), lumbar 09/14/2012  . Depression   . Diabetes mellitus without complication ([facility])    borderline   . Erectile dysfunction 09/14/2012  . GERD (gastroesophageal reflux disease)   . Gout    pt denies    . Headache    migraines in the past  . History of pancreatitis    a. admx 04-2009.Marland KitchenMarland Kitchen? 2-2 triglycerides  . Hyperlipidemia 07/11/2013  . Hypertension   . Hypertriglyceridemia    a. followed by [facility]  . Left sided sciatica 09/14/2012  . Metabolic syndrome   . Obesity   . OSA (obstructive sleep apnea)    does not use cpap  . Restless legs      Social History   Social History  . Marital status: Divorced    Spouse name: N/A  . Number of children: 1  . Years of education: N/A   Occupational History  . disabled Unemployed   Social History Main Topics  . Smoking status: Current Every Day Smoker    Packs/day: 0.50    Years: 20.00    Types: Cigarettes  . Smokeless tobacco: Never Used  . Alcohol use 1.2 oz/week    2 Shots of liquor per week     Comment: occasional   . Drug use: No  . Sexual activity: Not on file   Other Topics Concern  . Not on file   Social History Narrative   Regular exercise- No   Rare caffeine use     Past Surgical History:  Procedure Laterality Date  . COLONOSCOPY    . KNEE SURGERY     right  . LUMBAR LAMINECTOMY/DECOMPRESSION MICRODISCECTOMY N/A 08/26/2015   Procedure: Lumbar Two-Sacral One  Laminectomy for decompression;  Surgeon: [name],  MD;  Location: [facility];  Service: Neurosurgery;  Laterality: N/A;  . pericardectomy  07/15/2010   Dr. [name]  . pleurx catheter placement  07/15/2010   Dr. [name]  . SHOULDER SURGERY     right and left shoulders  . TONSILLECTOMY AND ADENOIDECTOMY     one tonsil    Family History  Problem Relation Age of Onset  . Diabetes Mother   . Diabetes Father   . Colon cancer Father 66  . Heart disease Father 8       CABG  . Pancreatitis Father   . Hypertension Other        entire family  . Liver cancer Paternal Uncle   . Diabetes Sister   . Stomach cancer Maternal Grandfather   . Pancreatitis Paternal Uncle        x 2  . Esophageal cancer Neg Hx   . Rectal cancer Neg Hx      Allergies  Allergen Reactions  . Diphenhydramine Itching, Palpitations and Other (See Comments)    "jittery"  . Other Itching and Swelling    Pecans: itching and swelling of the tongue   . Colchicine Diarrhea  . Lipitor [Atorvastatin] Other (See Comments)    Muscle cramps  . Septra [Sulfamethoxazole-Trimethoprim] Itching    Current Outpatient Prescriptions on File Prior to Visit  Medication Sig Dispense Refill  . amLODipine (NORVASC) 10 MG tablet TAKE ONE TABLET BY MOUTH DAILY 90 tablet 0  . ammonium lactate (AMLACTIN) 12 % lotion Apply 1 application topically as needed for dry skin. 400 g 3  . aspirin EC 81 MG tablet Take 81 mg by mouth daily.    . Blood Glucose Monitoring Suppl (ACCU-CHEK NANO SMARTVIEW) W/DEVICE KIT Use glucose meter to check blood sugar 1-3 times daily as directed. 1 kit 0  . cetirizine (ZYRTEC) 5 MG tablet Take 5 mg by mouth daily as needed for allergies.     . citalopram (CELEXA) 40 MG tablet TAKE ONE TABLET BY MOUTH DAILY 90 tablet 0  . fenofibrate (TRICOR) 145 MG tablet TAKE ONE TABLET BY MOUTH DAILY. 90 tablet 0  . fentaNYL (DURAGESIC - DOSED MCG/HR) 50 MCG/HR Place 50 mcg onto the skin every 3 (three) days.    . fluconazole (DIFLUCAN) 100 MG tablet Take 100 mg by mouth every other day.    . flunisolide (NASALIDE) 25 MCG/ACT (0.025%) SOLN Place 2 sprays into the nose 2 (two) times daily. 1 Bottle 3  . furosemide (LASIX) 40 MG tablet TAKE ONE TABLET BY MOUTH DAILY 90 tablet 0  . gabapentin (NEURONTIN) 300 MG capsule Take 900 mg by mouth 3 (three) times daily.    [name] glucose blood (ACCU-CHEK SMARTVIEW) test strip Use as instructed 100 each 12  . ibuprofen (ADVIL,MOTRIN) 800 MG tablet Take 800 mg by mouth every 8 (eight) hours as needed.    [name] lisinopril (PRINIVIL,ZESTRIL) 20 MG tablet TAKE ONE TABLET BY MOUTH TWICE A DAY 180 tablet 3  . metFORMIN (GLUCOPHAGE) 500 MG tablet TAKE ONE TABLET BY MOUTH TWICE A DAY WITH A MEAL 60 tablet 0  . Na Sulfate-K Sulfate-Mg  Sulf (SUPREP BOWEL PREP KIT) 17.5-3.13-1.6 GM/180ML SOLN suprep as directed.  No substitutions 354 mL 0  . OVER THE COUNTER MEDICATION Take 500 mg by mouth daily.    [name] oxyCODONE-acetaminophen (PERCOCET) 10-325 MG tablet Take 1 tablet by mouth every 6 (six) hours as needed for pain. 90 tablet 0  . Potassium Chloride ER 20 MEQ TBCR TAKE ONE  TABLET BY MOUTH DAILY 90 tablet 0  . rosuvastatin (CRESTOR) 20 MG tablet TAKE ONE TABLET BY MOUTH DAILY 90 tablet 0  . sildenafil (REVATIO) 20 MG tablet Take 1-5 tablets (20-100 mg total) by mouth daily as needed. 50 tablet 2  . terbinafine (LAMISIL AT) 1 % cream Apply 1 application topically 2 (two) times daily. (Patient taking differently: Apply 1 application topically daily as needed (foot fungus). ) 36 g 3  . VOLTAREN 1 % GEL Apply 2 g topically 4 (four) times daily. 100 g 5  . [DISCONTINUED] amoxicillin-clarithromycin-lansoprazole (PREVPAC) combo pack Take by mouth 2 (two) times daily. Follow package directions. 1 kit 0   No current facility-administered medications on file prior to visit.     BP (!) 162/90 (BP Location: Left Arm, Patient Position: Sitting, Cuff Size: Large)   Pulse 67   Temp 98.5 F (36.9 C) (Oral)   Resp 16   Ht '5\' 11"'  (1.803 m)   Wt 268 lb (121.6 kg)   SpO2 96%   BMI 37.38 kg/m      Objective:   Physical Exam  Constitutional: He appears well-developed and well-nourished.  Obese  HENT:  Right Ear: Hearing, external ear and ear canal normal. Tympanic membrane is bulging.  Left Ear: Hearing, external ear and ear canal normal. Tympanic membrane is bulging.  Nose: Rhinorrhea present. No mucosal edema. Right sinus exhibits no maxillary sinus tenderness and no frontal sinus tenderness. Left sinus exhibits no maxillary sinus tenderness and no frontal sinus tenderness.  Mouth/Throat: Uvula is midline. Posterior oropharyngeal erythema present. No oropharyngeal exudate or posterior oropharyngeal edema.  Cardiovascular: Normal rate,  regular rhythm and intact distal pulses.   Pulmonary/Chest: Effort normal and breath sounds normal. No respiratory distress. He has no wheezes.  Abdominal: Soft. He exhibits no distension. There is no tenderness. There is no CVA tenderness.  Genitourinary: Rectal exam shows no external hemorrhoid, no mass, no tenderness and anal tone normal. Prostate is not tender.  Genitourinary Comments: Prostate-regular, even,smooth without lumps.  Lymphadenopathy:    He has no cervical adenopathy.      Assessment and Plan  Nocturia- patient with multiple comorbid conditions that could be contributory. Digital prostate exam with no abnormal findings. POCT UA dip negative. Urine cultures sent. PSA, BMET, A1c ordered to rule out prostate, endocrine etiology. Tolteridone therapy initiated. Patient instructed to follow up for worsening or new urinary symptoms, fevers. He will continue to avoid fluids before bed. If all lab work is normal and detrol does not relieve symptoms, will consider adjustment of daily maintenance medications, referral to urology.   URI- Suspect viral. No fevers, malaise. He'll try  OTC medications for symptoms, rest and stay hydrated. Tessalon Rx sent. Instructed to follow up for worsening symptoms, fevers over 101, symptoms persistent beyond 1 week.

## 2017-01-19 LAB — URINE CULTURE
MICRO NUMBER:: 81190826
Result:: NO GROWTH
SPECIMEN QUALITY:: ADEQUATE

## 2017-01-25 ENCOUNTER — Ambulatory Visit: Payer: Self-pay | Admitting: Neurology

## 2017-01-25 ENCOUNTER — Telehealth: Payer: Self-pay

## 2017-01-25 NOTE — Telephone Encounter (Signed)
Patient no showed his OV 01/25/17 with Dr. Rexene Alberts.

## 2017-01-26 ENCOUNTER — Encounter: Payer: Self-pay | Admitting: Neurology

## 2017-01-27 ENCOUNTER — Other Ambulatory Visit: Payer: Self-pay | Admitting: *Deleted

## 2017-01-27 DIAGNOSIS — I10 Essential (primary) hypertension: Secondary | ICD-10-CM

## 2017-01-27 MED ORDER — FUROSEMIDE 40 MG PO TABS: 40.0000 mg | ORAL_TABLET | Freq: Every day | ORAL | 1 refills | Status: DC

## 2017-01-31 ENCOUNTER — Encounter: Payer: Self-pay | Admitting: Nurse Practitioner

## 2017-01-31 ENCOUNTER — Other Ambulatory Visit: Payer: Self-pay | Admitting: Family

## 2017-01-31 MED ORDER — AMOXICILLIN-POT CLAVULANATE 875-125 MG PO TABS: 1.0000 | ORAL_TABLET | Freq: Two times a day (BID) | ORAL | 0 refills | Status: DC

## 2017-02-01 MED ORDER — METFORMIN HCL 500 MG PO TABS: ORAL_TABLET | ORAL | 0 refills | Status: DC

## 2017-02-08 ENCOUNTER — Other Ambulatory Visit: Payer: Self-pay | Admitting: *Deleted

## 2017-02-08 DIAGNOSIS — M5416 Radiculopathy, lumbar region: Secondary | ICD-10-CM | POA: Diagnosis not present

## 2017-02-08 DIAGNOSIS — M545 Low back pain: Secondary | ICD-10-CM | POA: Diagnosis not present

## 2017-02-08 DIAGNOSIS — I10 Essential (primary) hypertension: Secondary | ICD-10-CM

## 2017-02-08 MED ORDER — POTASSIUM CHLORIDE ER 20 MEQ PO TBCR: 1.0000 | EXTENDED_RELEASE_TABLET | Freq: Every day | ORAL | 0 refills | Status: DC

## 2017-02-14 DIAGNOSIS — M5416 Radiculopathy, lumbar region: Secondary | ICD-10-CM | POA: Diagnosis not present

## 2017-02-16 DIAGNOSIS — R269 Unspecified abnormalities of gait and mobility: Secondary | ICD-10-CM | POA: Diagnosis not present

## 2017-02-16 DIAGNOSIS — G4733 Obstructive sleep apnea (adult) (pediatric): Secondary | ICD-10-CM | POA: Diagnosis not present

## 2017-02-20 ENCOUNTER — Encounter: Payer: Self-pay | Admitting: Nurse Practitioner

## 2017-02-20 MED ORDER — FLUCONAZOLE 100 MG PO TABS: 100.0000 mg | ORAL_TABLET | Freq: Every day | ORAL | 0 refills | Status: DC

## 2017-02-28 ENCOUNTER — Encounter: Payer: Self-pay | Admitting: Sports Medicine

## 2017-02-28 ENCOUNTER — Ambulatory Visit (INDEPENDENT_AMBULATORY_CARE_PROVIDER_SITE_OTHER): Payer: Medicare HMO | Admitting: Sports Medicine

## 2017-02-28 DIAGNOSIS — B351 Tinea unguium: Secondary | ICD-10-CM

## 2017-02-28 DIAGNOSIS — M79671 Pain in right foot: Secondary | ICD-10-CM

## 2017-02-28 DIAGNOSIS — E119 Type 2 diabetes mellitus without complications: Secondary | ICD-10-CM | POA: Diagnosis not present

## 2017-02-28 DIAGNOSIS — M79672 Pain in left foot: Secondary | ICD-10-CM | POA: Diagnosis not present

## 2017-02-28 NOTE — Progress Notes (Signed)
Subjective: Harold [name] is a 51 y.o. male patient with history of diabetes who returns to office for diabetic nail care; states that his sugars are controlled, a1c 6.9. Patient denies any new changes in medication or new problems.   Patient Active Problem List   Diagnosis Date Noted  . Sinus congestion 12/19/2016  . Thrush 08/02/2016  . Sleep disturbance 07/04/2016  . 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  . Urinary frequency 07/24/2015  . Trichomonas exposure 07/24/2015  . Type 2 diabetes mellitus (HCC) 03/04/2015  . Verruca vulgaris 02/24/2015  . Nocturia 02/17/2015  . Accessory skin tags 02/17/2015  . Sinusitis, acute 06/04/2014  . Lumbar radiculopathy, acute 05/23/2014  . Acute left-sided back pain with sciatica 04/10/2014  . Smoker 04/10/2014  . Cough 01/01/2014  . Grief reaction 11/19/2013  . Primary localized osteoarthrosis, lower leg 09/04/2013  . Chronic pain syndrome 07/11/2013  . Hyperlipidemia 07/11/2013  . 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 Gonalgia 11/29/2012  . Preventative health care 09/14/2012  . DDD (degenerative disc disease), lumbar 09/14/2012  . Left sided sciatica 09/14/2012  . Erectile dysfunction 09/14/2012  . Pre-ulcerative corn or callous 09/14/2012  . Hemorrhoids 07/26/2011  . Chronic back pain greater than 3 months duration 09/23/2010  . Abnormal EKG 08/12/2010  . OSA (obstructive sleep apnea) 08/09/2010  . 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 CHF (congestive heart failure) (HCC) 06/29/2010  . Edema 06/10/2010  . Acute idiopathic pericarditis 04/08/2010  . NONSPECIFIC ABNORMAL ELECTROCARDIOGRAM 04/08/2010  . HYPERTRIGLYCERIDEMIA 05/26/2009  . SHOULDER PAIN, LEFT 04/21/2008  . Depression 03/11/2008  . Essential hypertension 03/11/2008  . Gout, unspecified 05/01/2007  . METABOLIC SYNDROME X 10/24/2006  . OBESITY NOS 10/24/2006   Current Outpatient Medications on File Prior to Visit  Medication Sig Dispense Refill  . amLODipine  (NORVASC) 10 MG tablet TAKE ONE TABLET BY MOUTH DAILY 90 tablet 0  . ammonium lactate (AMLACTIN) 12 % lotion Apply 1 application topically as needed for dry skin. 400 g 3  . amoxicillin-clavulanate (AUGMENTIN) 875-125 MG tablet Take 1 tablet 2 (two) times daily by mouth. 14 tablet 0  . aspirin EC 81 MG tablet Take 81 mg by mouth daily.    . benzonatate (TESSALON) 100 MG capsule Take 1 capsule (100 mg total) by mouth 2 (two) times daily as needed for cough. 20 capsule 0  . Blood Glucose Monitoring Suppl (ACCU-CHEK NANO SMARTVIEW) W/DEVICE KIT Use glucose meter to check blood sugar 1-3 times daily as directed. 1 kit 0  . cetirizine (ZYRTEC) 5 MG tablet Take 5 mg by mouth daily as needed for allergies.     . citalopram (CELEXA) 40 MG tablet TAKE ONE TABLET BY MOUTH DAILY 90 tablet 0  . fenofibrate (TRICOR) 145 MG tablet TAKE ONE TABLET BY MOUTH DAILY. 90 tablet 0  . fentaNYL (DURAGESIC - DOSED MCG/HR) 50 MCG/HR Place 50 mcg onto the skin every 3 (three) days.    . fluconazole (DIFLUCAN) 100 MG tablet Take 1 tablet (100 mg total) by mouth daily. TAKE 2 tablets on day 1, THEN 1 tablet daily for the next 9 days 11 tablet 0  . flunisolide (NASALIDE) 25 MCG/ACT (0.025%) SOLN Place 2 sprays into the nose 2 (two) times daily. 1 Bottle 3  . furosemide (LASIX) 40 MG tablet Take 1 tablet (40 mg total) by mouth daily. 90 tablet 1  . gabapentin (NEURONTIN) 300 MG capsule Take 900 mg by mouth 3 (three) times daily.    . glucose blood (ACCU-CHEK SMARTVIEW) test strip Use as   instructed 100 each 12  . ibuprofen (ADVIL,MOTRIN) 800 MG tablet Take 800 mg by mouth every 8 (eight) hours as needed.    [name] lisinopril (PRINIVIL,ZESTRIL) 20 MG tablet TAKE ONE TABLET BY MOUTH TWICE A DAY 180 tablet 3  . metFORMIN (GLUCOPHAGE) 500 MG tablet TAKE ONE TABLET BY MOUTH TWICE A DAY WITH A MEAL 180 tablet 0  . Na Sulfate-K Sulfate-Mg Sulf (SUPREP BOWEL PREP KIT) 17.5-3.13-1.6 GM/180ML SOLN suprep as directed.  No substitutions 354 mL 0   . OVER THE COUNTER MEDICATION Take 500 mg by mouth daily.    [name] oxyCODONE-acetaminophen (PERCOCET) 10-325 MG tablet Take 1 tablet by mouth every 6 (six) hours as needed for pain. 90 tablet 0  . Potassium Chloride ER 20 MEQ TBCR Take 1 tablet daily by mouth. 90 tablet 0  . rosuvastatin (CRESTOR) 20 MG tablet TAKE ONE TABLET BY MOUTH DAILY 90 tablet 0  . sildenafil (REVATIO) 20 MG tablet Take 1-5 tablets (20-100 mg total) by mouth daily as needed. 50 tablet 2  . terbinafine (LAMISIL AT) 1 % cream Apply 1 application topically 2 (two) times daily. (Patient taking differently: Apply 1 application topically daily as needed (foot fungus). ) 36 g 3  . tolterodine (DETROL) 2 MG tablet Take 1 tablet (2 mg total) by mouth 2 (two) times daily. 60 tablet 1  . VOLTAREN 1 % GEL Apply 2 g topically 4 (four) times daily. 100 g 5  . [DISCONTINUED] amoxicillin-clarithromycin-lansoprazole (PREVPAC) combo pack Take by mouth 2 (two) times daily. Follow package directions. 1 kit 0   No current facility-administered medications on file prior to visit.    Allergies  Allergen Reactions  . Diphenhydramine Itching, Palpitations and Other (See Comments)    "jittery"  . Other Itching and Swelling    Pecans: itching and swelling of the tongue   . Colchicine Diarrhea  . Lipitor [Atorvastatin] Other (See Comments)    Muscle cramps  . Septra [Sulfamethoxazole-Trimethoprim] Itching    Recent Results (from the past 2160 hour(s))  POCT urinalysis dipstick     Status: Abnormal   Collection Time: 01/18/17  3:42 PM  Result Value Ref Range   Color, UA yellow    Clarity, UA clear    Glucose, UA negative    Bilirubin, UA negative    Ketones, UA negative    Spec Grav, UA 1.025 1.010 - 1.025   Blood, UA negative    pH, UA 6.0 5.0 - 8.0   Protein, UA negative    Urobilinogen, UA negative (A) 0.2 or 1.0 E.U./dL   Nitrite, UA negative    Leukocytes, UA Negative Negative  Urine Culture     Status: None   Collection Time:  01/18/17  4:44 PM  Result Value Ref Range   MICRO NUMBER: [id number]    SPECIMEN QUALITY: ADEQUATE    Sample Source URINE    STATUS: FINAL    Result: No Growth   Basic metabolic panel     Status: Abnormal   Collection Time: 01/18/17  4:44 PM  Result Value Ref Range   Sodium 139 135 - 145 mEq/L   Potassium 4.0 3.5 - 5.1 mEq/L   Chloride 102 96 - 112 mEq/L   CO2 28 19 - 32 mEq/L   Glucose, Bld 109 (H) 70 - 99 mg/dL   BUN 14 6 - 23 mg/dL   Creatinine, Ser 1.18 0.40 - 1.50 mg/dL   Calcium 10.0 8.4 - 10.5 mg/dL   GFR 84.21 >60.00 mL/min  Hemoglobin A1c     Status: Abnormal   Collection Time: 01/18/17  4:44 PM  Result Value Ref Range   Hgb A1c MFr Bld 6.9 (H) 4.6 - 6.5 %    Comment: Glycemic Control Guidelines for People with Diabetes:Non Diabetic:  <6%Goal of Therapy: <7%Additional Action Suggested:  >8%   PSA     Status: None   Collection Time: 01/18/17  4:44 PM  Result Value Ref Range   PSA 2.15 0.10 - 4.00 ng/mL    Comment: Test performed using Access Hybritech PSA Assay, a parmagnetic partical, chemiluminecent immunoassay.    Objective: General: Patient is awake, alert, and oriented x 3 and in no acute distress.  Integument: Skin is warm, dry and supple bilateral. Nails are tender, long, thickened and dystrophic with subungual debris, consistent with onychomycosis, 1-5 bilateral. Scaly skin in annular fashion suggestive of tinea resolved with superimposed dry skin that is much improved. No acute signs of infection. No open lesions or preulcerative lesions present bilateral. Remaining integument unremarkable.  Vasculature:  Dorsalis Pedis pulse 2/4 bilateral. Posterior Tibial pulse  2/4 bilateral. Capillary fill time <3 sec 1-5 bilateral. Positive hair growth to the level of the digits.Temperature gradient within normal limits. No varicosities present bilateral. No edema present bilateral.   Neurology: The patient has intact sensation measured with a 5.07/10g Semmes Weinstein  Monofilament at all pedal sites bilateral . Vibratory sensation intact bilateral with tuning fork. No Babinski sign present bilateral.   Musculoskeletal: No symptomatic pedal deformities noted bilateral. Muscular strength 5/5 in all lower extremity muscular groups bilateral without pain on range of motion. No tenderness with calf compression bilateral.  Assessment and Plan: Problem List Items Addressed This Visit    None    Visit Diagnoses    Dermatophytosis of nail    -  Primary   Diabetes mellitus without complication (HCC)       Foot pain, bilateral         -Examined patient. -Discussed and educated patient on diabetic foot care, especially with regards to the vascular, neurological and musculoskeletal systems.  -Stressed the importance of good glycemic control and the detriment of not controlling glucose levels in relation to the foot. -Mechanically debrided all nails 1-5 bilateral using sterile nail nipper and filed with dremel without incident  -Lamisil cream completed  -Cont with Amlactin lotion for dry skin  -Answered all patient questions -Patient to return  in 3 months for at risk foot care -Patient advised to call the office if any problems or questions arise in the meantime.  Landis Martins, DPM

## 2017-03-12 ENCOUNTER — Other Ambulatory Visit: Payer: Self-pay | Admitting: Family

## 2017-03-15 DIAGNOSIS — M5416 Radiculopathy, lumbar region: Secondary | ICD-10-CM | POA: Diagnosis not present

## 2017-03-15 DIAGNOSIS — M5116 Intervertebral disc disorders with radiculopathy, lumbar region: Secondary | ICD-10-CM | POA: Diagnosis not present

## 2017-03-15 DIAGNOSIS — M25561 Pain in right knee: Secondary | ICD-10-CM | POA: Diagnosis not present

## 2017-03-17 DIAGNOSIS — S29012A Strain of muscle and tendon of back wall of thorax, initial encounter: Secondary | ICD-10-CM | POA: Diagnosis not present

## 2017-03-17 DIAGNOSIS — M9902 Segmental and somatic dysfunction of thoracic region: Secondary | ICD-10-CM | POA: Diagnosis not present

## 2017-03-17 DIAGNOSIS — M5136 Other intervertebral disc degeneration, lumbar region: Secondary | ICD-10-CM | POA: Diagnosis not present

## 2017-03-17 DIAGNOSIS — S39012A Strain of muscle, fascia and tendon of lower back, initial encounter: Secondary | ICD-10-CM | POA: Diagnosis not present

## 2017-03-17 DIAGNOSIS — M9905 Segmental and somatic dysfunction of pelvic region: Secondary | ICD-10-CM | POA: Diagnosis not present

## 2017-03-17 DIAGNOSIS — M5137 Other intervertebral disc degeneration, lumbosacral region: Secondary | ICD-10-CM | POA: Diagnosis not present

## 2017-03-17 DIAGNOSIS — M9903 Segmental and somatic dysfunction of lumbar region: Secondary | ICD-10-CM | POA: Diagnosis not present

## 2017-03-17 DIAGNOSIS — M9904 Segmental and somatic dysfunction of sacral region: Secondary | ICD-10-CM | POA: Diagnosis not present

## 2017-03-19 ENCOUNTER — Other Ambulatory Visit: Payer: Self-pay | Admitting: Family

## 2017-03-19 DIAGNOSIS — I10 Essential (primary) hypertension: Secondary | ICD-10-CM

## 2017-03-24 DIAGNOSIS — M5116 Intervertebral disc disorders with radiculopathy, lumbar region: Secondary | ICD-10-CM | POA: Diagnosis not present

## 2017-03-24 DIAGNOSIS — M5416 Radiculopathy, lumbar region: Secondary | ICD-10-CM | POA: Diagnosis not present

## 2017-04-03 DIAGNOSIS — L83 Acanthosis nigricans: Secondary | ICD-10-CM | POA: Diagnosis not present

## 2017-05-01 ENCOUNTER — Other Ambulatory Visit: Payer: Self-pay | Admitting: Sports Medicine

## 2017-05-01 DIAGNOSIS — M25561 Pain in right knee: Secondary | ICD-10-CM | POA: Diagnosis not present

## 2017-05-01 DIAGNOSIS — M1711 Unilateral primary osteoarthritis, right knee: Secondary | ICD-10-CM | POA: Diagnosis not present

## 2017-05-18 ENCOUNTER — Other Ambulatory Visit: Payer: Self-pay | Admitting: Nurse Practitioner

## 2017-05-18 ENCOUNTER — Other Ambulatory Visit: Payer: Self-pay | Admitting: Family

## 2017-05-18 DIAGNOSIS — R351 Nocturia: Secondary | ICD-10-CM

## 2017-05-19 ENCOUNTER — Encounter: Payer: Self-pay | Admitting: Nurse Practitioner

## 2017-05-22 ENCOUNTER — Other Ambulatory Visit: Payer: Self-pay

## 2017-05-22 MED ORDER — ROSUVASTATIN CALCIUM 20 MG PO TABS: 20.0000 mg | ORAL_TABLET | Freq: Every day | ORAL | 0 refills | Status: DC

## 2017-05-22 MED ORDER — CITALOPRAM HYDROBROMIDE 40 MG PO TABS: 40.0000 mg | ORAL_TABLET | Freq: Every day | ORAL | 0 refills | Status: DC

## 2017-05-22 MED ORDER — AMLODIPINE BESYLATE 10 MG PO TABS: 10.0000 mg | ORAL_TABLET | Freq: Every day | ORAL | 0 refills | Status: DC

## 2017-05-30 ENCOUNTER — Ambulatory Visit: Payer: Medicare HMO | Admitting: Sports Medicine

## 2017-05-30 DIAGNOSIS — M25561 Pain in right knee: Secondary | ICD-10-CM | POA: Diagnosis not present

## 2017-05-30 DIAGNOSIS — M5116 Intervertebral disc disorders with radiculopathy, lumbar region: Secondary | ICD-10-CM | POA: Diagnosis not present

## 2017-05-31 DIAGNOSIS — M25562 Pain in left knee: Secondary | ICD-10-CM | POA: Diagnosis not present

## 2017-06-01 ENCOUNTER — Telehealth: Payer: Self-pay | Admitting: Nurse Practitioner

## 2017-06-01 NOTE — Telephone Encounter (Signed)
Copied from [place] [id number]. Topic: Quick Communication - Rx Refill/Question >> Jun 01, 2017  3:33 PM Synthia Innocent wrote: Medication: True Plus Lancets    Has the patient contacted their pharmacy? Yes.     (Agent: If no, request that the patient contact the pharmacy for the refill.)   Preferred Pharmacy (with phone number or street name): [facility]: Please be advised that RX refills may take up to 3 business days. We ask that you follow-up with your pharmacy.

## 2017-06-01 NOTE — Telephone Encounter (Signed)
[facility] calling to request rx for True Plus Lancets.Prescription not currently found in pt's chart.

## 2017-06-02 MED ORDER — TRUEPLUS LANCETS 33G MISC: 1 refills | Status: DC

## 2017-06-02 NOTE — Telephone Encounter (Signed)
Reviewed chart pt is up-to-date sent rx to [facility]Marland KitchenJohny Chess

## 2017-06-03 ENCOUNTER — Other Ambulatory Visit: Payer: Self-pay

## 2017-06-03 ENCOUNTER — Encounter (HOSPITAL_COMMUNITY): Payer: Self-pay | Admitting: Emergency Medicine

## 2017-06-03 ENCOUNTER — Emergency Department (HOSPITAL_COMMUNITY)
Admission: EM | Admit: 2017-06-03 | Discharge: 2017-06-03 | Disposition: A | Discharge status: Home / Self Care | Payer: Medicare HMO | Attending: Emergency Medicine | Admitting: Emergency Medicine

## 2017-06-03 DIAGNOSIS — J3489 Other specified disorders of nose and nasal sinuses: Secondary | ICD-10-CM | POA: Insufficient documentation

## 2017-06-03 DIAGNOSIS — Z7984 Long term (current) use of oral hypoglycemic drugs: Secondary | ICD-10-CM | POA: Diagnosis not present

## 2017-06-03 DIAGNOSIS — J0100 Acute maxillary sinusitis, unspecified: Secondary | ICD-10-CM

## 2017-06-03 DIAGNOSIS — R0981 Nasal congestion: Secondary | ICD-10-CM | POA: Diagnosis not present

## 2017-06-03 DIAGNOSIS — R040 Epistaxis: Secondary | ICD-10-CM | POA: Diagnosis present

## 2017-06-03 DIAGNOSIS — E119 Type 2 diabetes mellitus without complications: Secondary | ICD-10-CM | POA: Insufficient documentation

## 2017-06-03 DIAGNOSIS — Z7982 Long term (current) use of aspirin: Secondary | ICD-10-CM | POA: Diagnosis not present

## 2017-06-03 DIAGNOSIS — F17210 Nicotine dependence, cigarettes, uncomplicated: Secondary | ICD-10-CM | POA: Insufficient documentation

## 2017-06-03 DIAGNOSIS — Z79899 Other long term (current) drug therapy: Secondary | ICD-10-CM | POA: Diagnosis not present

## 2017-06-03 MED ORDER — OXYMETAZOLINE HCL 0.05 % NA SOLN
1.0000 | Freq: Once | NASAL | Status: AC
  Administered 2017-06-03: 1 via NASAL
  Filled 2017-06-03: qty 15

## 2017-06-03 MED ORDER — AMOXICILLIN-POT CLAVULANATE 875-125 MG PO TABS: 1.0000 | ORAL_TABLET | Freq: Two times a day (BID) | ORAL | 0 refills | Status: DC

## 2017-06-03 NOTE — ED Provider Notes (Signed)
[facility] EMERGENCY DEPARTMENT Provider Note   CSN: [id number] Arrival date & time: 06/03/17  2052     History   Chief Complaint Chief Complaint  Patient presents with  . Epistaxis    HPI Harold [name] is a 52 y.o. male.  The history is provided by the patient and medical records. No language interpreter was used.  Epistaxis     Harold [name] is a 52 y.o. male  with a PMH of HTN, HLD, DM who presents to the Emergency Department complaining of left-sided epistaxis.  He reports that the nosebleed initially started around 2 PM today.  He was able to get the nosebleed to resolve with a few minutes of direct pressure.  Just prior to arrival, no started bleeding again, therefore he came to the emergency department for further evaluation.  He is on a baby aspirin which he took this morning.  No other anticoagulation.  Takes blood pressure medication as directed and reports blood pressure has been well controlled.  He has had nasal congestion and sinus pressure for about 2-1/2 weeks.  This is been worsening.  He has been blowing his nose regularly and getting thick yellow mucus with this.  Denies any fever or chills.  No sore throat.   Past Medical History:  Diagnosis Date  . Acute pericarditis    admx 03-26-10 thru 03-29-10; a. echo 03-26-10: EF 55-60%; mild LVH; trivial MR; RVF ok; mild -mod circumferential Eff w/o tamponade  . Allergic rhinitis, cause unspecified 09/14/2012  . Allergy   . Anxiety   . Chronic back pain   . DDD (degenerative disc disease), lumbar 09/14/2012  . Depression   . Diabetes mellitus without complication (HCC)    borderline   . Erectile dysfunction 09/14/2012  . GERD (gastroesophageal reflux disease)   . Gout    pt denies  . Headache    migraines in the past  . History of pancreatitis    a. admx 04-2009.Marland KitchenMarland Kitchen? 2-2 triglycerides  . Hyperlipidemia 07/11/2013  . Hypertension   . Hypertriglyceridemia    a. followed by [facility]  .  Left sided sciatica 09/14/2012  . Metabolic syndrome   . Obesity   . OSA (obstructive sleep apnea)    does not use cpap  . Restless legs     Patient Active Problem List   Diagnosis Date Noted  . Sinus congestion 12/19/2016  . Thrush 08/02/2016  . Sleep disturbance 07/04/2016  . 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  . Urinary frequency 07/24/2015  . Trichomonas exposure 07/24/2015  . Type 2 diabetes mellitus ([place]) 03/04/2015  . Verruca vulgaris 02/24/2015  . Nocturia 02/17/2015  . Accessory skin tags 02/17/2015  . Sinusitis, acute 06/04/2014  . Lumbar radiculopathy, acute 05/23/2014  . Acute left-sided back pain with sciatica 04/10/2014  . Smoker 04/10/2014  . Cough 01/01/2014  . Grief reaction 11/19/2013  . Primary localized osteoarthrosis, lower leg 09/04/2013  . Chronic pain syndrome 07/11/2013  . Hyperlipidemia 07/11/2013  . 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 Gonalgia 11/29/2012  . Preventative health care 09/14/2012  . DDD (degenerative disc disease), lumbar 09/14/2012  . Left sided sciatica 09/14/2012  . Erectile dysfunction 09/14/2012  . Pre-ulcerative corn or callous 09/14/2012  . Hemorrhoids 07/26/2011  . Chronic back pain greater than 3 months duration 09/23/2010  . Abnormal EKG 08/12/2010  . OSA (obstructive sleep apnea) 08/09/2010  . 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 CHF (congestive heart failure) ([place]) 06/29/2010  . Edema 06/10/2010  .  Acute idiopathic pericarditis 04/08/2010  . NONSPECIFIC ABNORMAL ELECTROCARDIOGRAM 04/08/2010  . HYPERTRIGLYCERIDEMIA 05/26/2009  . SHOULDER PAIN, LEFT 04/21/2008  . Depression 03/11/2008  . Essential hypertension 03/11/2008  . Gout, unspecified 05/01/2007  . METABOLIC SYNDROME X 01/00/7121  . OBESITY NOS 10/24/2006    Past Surgical History:  Procedure Laterality Date  . COLONOSCOPY    . KNEE SURGERY     right  . LUMBAR LAMINECTOMY/DECOMPRESSION MICRODISCECTOMY N/A 08/26/2015   Procedure: Lumbar Two-Sacral  One  Laminectomy for decompression;  Surgeon: [name], MD;  Location: [facility];  Service: Neurosurgery;  Laterality: N/A;  . pericardectomy  07/15/2010   Dr. [name]  . pleurx catheter placement  07/15/2010   Dr. [name]  . SHOULDER SURGERY     right and left shoulders  . TONSILLECTOMY AND ADENOIDECTOMY     one tonsil       Home Medications    Prior to Admission medications   Medication Sig Start Date End Date Taking? Authorizing Provider  amLODipine (NORVASC) 10 MG tablet Take 1 tablet (10 mg total) by mouth daily. 05/22/17   Lance Sell, NP  ammonium lactate (AMLACTIN) 12 % lotion Apply 1 application topically as needed for dry skin. 05/31/16   Landis Martins, DPM  amoxicillin-clavulanate (AUGMENTIN) 875-125 MG tablet Take 1 tablet by mouth every 12 (twelve) hours. 06/03/17   Kassidy Frankson, Ozella Almond, PA-C  aspirin EC 81 MG tablet Take 81 mg by mouth daily.    [provider]  benzonatate (TESSALON) 100 MG capsule Take 1 capsule (100 mg total) by mouth 2 (two) times daily as needed for cough. 01/18/17   Lance Sell, NP  Blood Glucose Monitoring Suppl (ACCU-CHEK NANO SMARTVIEW) W/DEVICE KIT Use glucose meter to check blood sugar 1-3 times daily as directed. 03/10/15   Golden Circle, FNP  cetirizine (ZYRTEC) 5 MG tablet Take 5 mg by mouth daily as needed for allergies.     [provider]  citalopram (CELEXA) 40 MG tablet Take 1 tablet (40 mg total) by mouth daily. 05/22/17   Lance Sell, NP  fenofibrate (TRICOR) 145 MG tablet TAKE ONE TABLET BY MOUTH DAILY. 11/07/16   Golden Circle, FNP  fentaNYL (DURAGESIC - DOSED MCG/HR) 50 MCG/HR Place 50 mcg onto the skin every 3 (three) days. 09/25/16   [provider]  fluconazole (DIFLUCAN) 100 MG tablet Take 1 tablet (100 mg total) by mouth daily. TAKE 2 tablets on day 1, THEN 1 tablet daily for the next 9 days 02/20/17   Lance Sell, NP  flunisolide (NASALIDE) 25 MCG/ACT  (0.025%) SOLN Place 2 sprays into the nose 2 (two) times daily. 09/21/16   Elby Beck, FNP  furosemide (LASIX) 40 MG tablet Take 1 tablet (40 mg total) by mouth daily. 01/27/17   Lance Sell, NP  gabapentin (NEURONTIN) 300 MG capsule Take 900 mg by mouth 3 (three) times daily.    [provider]  glucose blood (ACCU-CHEK SMARTVIEW) test strip Use as instructed 03/10/15   Golden Circle, FNP  ibuprofen (ADVIL,MOTRIN) 800 MG tablet Take 800 mg by mouth every 8 (eight) hours as needed.    [provider]  lisinopril (PRINIVIL,ZESTRIL) 20 MG tablet TAKE ONE TABLET BY MOUTH TWICE A DAY 11/02/16   Golden Circle, FNP  metFORMIN (GLUCOPHAGE) 500 MG tablet TAKE ONE TABLET BY MOUTH TWICE A DAY WITH A MEAL 02/01/17   Shambley, Delphia Grates, NP  Na Sulfate-K Sulfate-Mg Sulf (SUPREP BOWEL  PREP KIT) 17.5-3.13-1.6 GM/180ML SOLN suprep as directed.  No substitutions 10/03/16   Pyrtle, Lajuan Lines, MD  OVER THE COUNTER MEDICATION Take 500 mg by mouth daily.    [provider]  oxyCODONE-acetaminophen (PERCOCET) 10-325 MG tablet Take 1 tablet by mouth every 6 (six) hours as needed for pain. 08/27/15   Ditty, Kevan Ny, MD  Potassium Chloride ER 20 MEQ TBCR Take 1 tablet daily by mouth. 02/08/17   Lance Sell, NP  rosuvastatin (CRESTOR) 20 MG tablet Take 1 tablet (20 mg total) by mouth daily. 05/22/17   Lance Sell, NP  sildenafil (REVATIO) 20 MG tablet Take 1-5 tablets (20-100 mg total) by mouth daily as needed. 07/04/16   Golden Circle, FNP  terbinafine (LAMISIL AT) 1 % cream Apply 1 application topically 2 (two) times daily. Patient taking differently: Apply 1 application topically daily as needed (foot fungus).  05/31/16   Landis Martins, DPM  terbinafine (LAMISIL) 250 MG tablet TAKE ONE TABLET BY MOUTH DAILY 05/01/17   Landis Martins, DPM  tolterodine (DETROL) 2 MG tablet TAKE ONE TABLET BY MOUTH TWICE A DAY 05/19/17   Lance Sell, NP  TRUEPLUS  LANCETS 33G MISC Use to check blood sugars twice a day 06/02/17   Lance Sell, NP  VOLTAREN 1 % GEL Apply 2 g topically 4 (four) times daily. 02/17/15   Golden Circle, FNP    Family History Family History  Problem Relation Age of Onset  . Diabetes Mother   . Diabetes Father   . Colon cancer Father 49  . Heart disease Father 32       CABG  . Pancreatitis Father   . Hypertension Other        entire family  . Liver cancer Paternal Uncle   . Diabetes Sister   . Stomach cancer Maternal Grandfather   . Pancreatitis Paternal Uncle        x 2  . Esophageal cancer Neg Hx   . Rectal cancer Neg Hx     Social History Social History   Tobacco Use  . Smoking status: Current Every Day Smoker    Packs/day: 0.50    Years: 20.00    Pack years: 10.00    Types: Cigarettes  . Smokeless tobacco: Never Used  Substance Use Topics  . Alcohol use: Yes    Alcohol/week: 1.2 oz    Types: 2 Shots of liquor per week    Comment: occasional   . Drug use: No     Allergies   Diphenhydramine; Other; Colchicine; Lipitor [atorvastatin]; and Septra [sulfamethoxazole-trimethoprim]   Review of Systems Review of Systems  Constitutional: Negative for fever.  HENT: Positive for congestion, nosebleeds, sinus pressure and sinus pain. Negative for sore throat.   Respiratory: Negative for cough and shortness of breath.      Physical Exam Updated Vital Signs BP (!) 150/94 (BP Location: Right Arm)   Temp 97.8 F (36.6 C) (Oral)   Resp 18   SpO2 97%   Physical Exam  Constitutional: He appears well-developed and well-nourished. No distress.  HENT:  Head: Normocephalic and atraumatic.  Mouth/Throat: Oropharynx is clear and moist.  Tenderness to palpation of bilateral maxillary sinuses.  No active bleeding of the nares.  Neck: Neck supple.  Cardiovascular: Normal rate, regular rhythm and normal heart sounds.  No murmur heard. Pulmonary/Chest: Effort normal and breath sounds normal. No  respiratory distress. He has no wheezes. He has no rales.  Musculoskeletal: Normal range of  motion.  Neurological: He is alert.  Skin: Skin is warm and dry.  Nursing note and vitals reviewed.    ED Treatments / Results  Labs (all labs ordered are listed, but only abnormal results are displayed) Labs Reviewed - No data to display  EKG  EKG Interpretation None       Radiology No results found.  Procedures Procedures (including critical care time)  Medications Ordered in ED Medications  oxymetazoline (AFRIN) 0.05 % nasal spray 1 spray (1 spray Each Nare Given 06/03/17 2107)     Initial Impression / Assessment and Plan / ED Course  I have reviewed the triage vital signs and the nursing notes.  Pertinent labs & imaging results that were available during my care of the patient were reviewed by me and considered in my medical decision making (see chart for details).    Harold [name] is a 52 y.o. male who presents to ED for epistaxis which began today.  He has also been experiencing sinus congestion and sinus pressure for the last 2-1/2 weeks.  On exam, patient is afebrile and hemodynamically stable.  He was given Afrin in triage and direct pressure applied.  On my evaluation, nose has stopped bleeding.  He does have tenderness to maxillary sinuses bilaterally.  Given symptoms of over 2 weeks which have been progressively worsening, will treat for sinusitis.  Epistaxis home care instructions discussed as well.  Reasons to return to ER discussed.  PCP follow-up encouraged.  All questions answered.  Final Clinical Impressions(s) / ED Diagnoses   Final diagnoses:  Acute non-recurrent maxillary sinusitis    ED Discharge Orders        Ordered    amoxicillin-clavulanate (AUGMENTIN) 875-125 MG tablet  Every 12 hours     06/03/17 2248       Oniyah Rohe, Ozella Almond, PA-C 06/03/17 2317    Gareth Morgan, MD 06/04/17 1416

## 2017-06-03 NOTE — Discharge Instructions (Signed)
It was my pleasure taking care of you today!   Please take all of your antibiotics until finished! Use Afrin tonight before bed and in the morning, then only as needed if nosebleed recurs (see below).   There are several techniques you can use to prevent or stop nosebleeds in the future. Keep your nose moist either with saline spray several times a day or by applying a thin layer of Neosporin, bacitracin or other antibiotic ointment to the inside of your nose once or twice a day.  If the bleeding starts again, gently blow your nose into a tissue to clear the blood and clots, then apply 1-2 sprays to each affected nostril of over-the-counter Afrin nasal spray (oxymetazoline). Then squeeze your nose shut tightly and DO NOT PEEK for at least 15 minutes. This will resolve most nosebleeds.  If you continue to have trouble after trying these techniques, or anything seems out of the ordinary or concerns you, please return to the Emergency Department.

## 2017-06-03 NOTE — ED Triage Notes (Signed)
The patient said he has been bleeding since 15 minutes ago.  He said he did have some bleeding this morning but it stopped.  The patient denies blood thinners but is taking blood pressure medications.  Patient denies any pain.

## 2017-06-08 ENCOUNTER — Other Ambulatory Visit: Payer: Self-pay | Admitting: Nurse Practitioner

## 2017-06-20 ENCOUNTER — Ambulatory Visit (INDEPENDENT_AMBULATORY_CARE_PROVIDER_SITE_OTHER): Payer: Medicare HMO | Admitting: Internal Medicine

## 2017-06-20 ENCOUNTER — Encounter: Payer: Self-pay | Admitting: Internal Medicine

## 2017-06-20 DIAGNOSIS — E119 Type 2 diabetes mellitus without complications: Secondary | ICD-10-CM | POA: Diagnosis not present

## 2017-06-20 DIAGNOSIS — J0140 Acute pansinusitis, unspecified: Secondary | ICD-10-CM | POA: Diagnosis not present

## 2017-06-20 DIAGNOSIS — R0981 Nasal congestion: Secondary | ICD-10-CM

## 2017-06-20 MED ORDER — METHYLPREDNISOLONE ACETATE 80 MG/ML IJ SUSP
80.0000 mg | Freq: Once | INTRAMUSCULAR | Status: AC
  Administered 2017-06-20: 80 mg via INTRAMUSCULAR

## 2017-06-20 MED ORDER — PSEUDOEPHEDRINE HCL ER 120 MG PO TB12: 120.0000 mg | ORAL_TABLET | Freq: Two times a day (BID) | ORAL | 1 refills | Status: AC | PRN

## 2017-06-20 MED ORDER — AMOXICILLIN-POT CLAVULANATE 875-125 MG PO TABS: 1.0000 | ORAL_TABLET | Freq: Two times a day (BID) | ORAL | 0 refills | Status: DC

## 2017-06-20 NOTE — Addendum Note (Signed)
Addended by: Karren Cobble on: 06/20/2017 03:25 PM   Modules accepted: Orders

## 2017-06-20 NOTE — Progress Notes (Signed)
Subjective:  Patient ID: Harold [name], male    DOB: 16-Sep-1965  Age: 52 y.o. MRN: [id number]  CC: No chief complaint on file.   HPI Harold [name] presents for sinusitis - 40% better after 7 day of Augmentin - worse again. C/o allergies - asking for a steroid shot   Outpatient Medications Prior to Visit  Medication Sig Dispense Refill  . amLODipine (NORVASC) 10 MG tablet Take 1 tablet (10 mg total) by mouth daily. 90 tablet 0  . ammonium lactate (AMLACTIN) 12 % lotion Apply 1 application topically as needed for dry skin. 400 g 3  . aspirin EC 81 MG tablet Take 81 mg by mouth daily.    . benzonatate (TESSALON) 100 MG capsule Take 1 capsule (100 mg total) by mouth 2 (two) times daily as needed for cough. 20 capsule 0  . Blood Glucose Monitoring Suppl (ACCU-CHEK NANO SMARTVIEW) W/DEVICE KIT Use glucose meter to check blood sugar 1-3 times daily as directed. 1 kit 0  . cetirizine (ZYRTEC) 5 MG tablet Take 5 mg by mouth daily as needed for allergies.     . citalopram (CELEXA) 40 MG tablet Take 1 tablet (40 mg total) by mouth daily. 90 tablet 0  . fenofibrate (TRICOR) 145 MG tablet TAKE ONE TABLET BY MOUTH DAILY. 90 tablet 0  . fentaNYL (DURAGESIC - DOSED MCG/HR) 50 MCG/HR Place 50 mcg onto the skin every 3 (three) days.    . fluconazole (DIFLUCAN) 100 MG tablet Take 1 tablet (100 mg total) by mouth daily. TAKE 2 tablets on day 1, THEN 1 tablet daily for the next 9 days 11 tablet 0  . flunisolide (NASALIDE) 25 MCG/ACT (0.025%) SOLN Place 2 sprays into the nose 2 (two) times daily. 1 Bottle 3  . furosemide (LASIX) 40 MG tablet Take 1 tablet (40 mg total) by mouth daily. 90 tablet 1  . gabapentin (NEURONTIN) 300 MG capsule Take 900 mg by mouth 3 (three) times daily.    [name] glucose blood (ACCU-CHEK SMARTVIEW) test strip Use as instructed 100 each 12  . ibuprofen (ADVIL,MOTRIN) 800 MG tablet Take 800 mg by mouth every 8 (eight) hours as needed.    [name] lisinopril (PRINIVIL,ZESTRIL) 20 MG tablet  TAKE ONE TABLET BY MOUTH TWICE A DAY 180 tablet 3  . metFORMIN (GLUCOPHAGE) 500 MG tablet TAKE ONE TABLET BY MOUTH TWICE A DAY WITH A MEAL 60 tablet 0  . Na Sulfate-K Sulfate-Mg Sulf (SUPREP BOWEL PREP KIT) 17.5-3.13-1.6 GM/180ML SOLN suprep as directed.  No substitutions 354 mL 0  . OVER THE COUNTER MEDICATION Take 500 mg by mouth daily.    [name] oxyCODONE-acetaminophen (PERCOCET) 10-325 MG tablet Take 1 tablet by mouth every 6 (six) hours as needed for pain. 90 tablet 0  . Potassium Chloride ER 20 MEQ TBCR Take 1 tablet daily by mouth. 90 tablet 0  . rosuvastatin (CRESTOR) 20 MG tablet Take 1 tablet (20 mg total) by mouth daily. 90 tablet 0  . sildenafil (REVATIO) 20 MG tablet Take 1-5 tablets (20-100 mg total) by mouth daily as needed. 50 tablet 2  . terbinafine (LAMISIL AT) 1 % cream Apply 1 application topically 2 (two) times daily. (Patient taking differently: Apply 1 application topically daily as needed (foot fungus). ) 36 g 3  . terbinafine (LAMISIL) 250 MG tablet TAKE ONE TABLET BY MOUTH DAILY 90 tablet 0  . tolterodine (DETROL) 2 MG tablet TAKE ONE TABLET BY MOUTH TWICE A DAY 60 tablet 0  . TRUEPLUS  LANCETS 33G MISC Use to check blood sugars twice a day 300 each 1  . VOLTAREN 1 % GEL Apply 2 g topically 4 (four) times daily. 100 g 5  . amoxicillin-clavulanate (AUGMENTIN) 875-125 MG tablet Take 1 tablet by mouth every 12 (twelve) hours. 14 tablet 0   No facility-administered medications prior to visit.     ROS Review of Systems  Constitutional: Negative for appetite change, fatigue and unexpected weight change.  HENT: Positive for congestion, rhinorrhea and sinus pressure. Negative for nosebleeds, sneezing, sore throat and trouble swallowing.   Eyes: Negative for itching and visual disturbance.  Respiratory: Negative for cough.   Cardiovascular: Negative for chest pain, palpitations and leg swelling.  Gastrointestinal: Negative for abdominal distention, blood in stool, diarrhea and  nausea.  Genitourinary: Negative for frequency and hematuria.  Musculoskeletal: Negative for back pain, gait problem, joint swelling and neck pain.  Skin: Negative for rash.  Neurological: Negative for dizziness, tremors, speech difficulty and weakness.  Psychiatric/Behavioral: Negative for agitation, dysphoric mood and sleep disturbance. The patient is not nervous/anxious.     Objective:  BP 128/82 (BP Location: Left Arm, Patient Position: Sitting, Cuff Size: Large)   Pulse 67   Temp 98.5 F (36.9 C) (Oral)   Ht '5\' 11"'  (1.803 m)   Wt 263 lb (119.3 kg)   SpO2 99%   BMI 36.68 kg/m   BP Readings from Last 3 Encounters:  06/20/17 128/82  06/03/17 (!) 150/94  01/18/17 (!) 162/90    Wt Readings from Last 3 Encounters:  06/20/17 263 lb (119.3 kg)  01/18/17 268 lb (121.6 kg)  12/19/16 263 lb 12.8 oz (119.7 kg)    Physical Exam  Constitutional: He is oriented to person, place, and time. He appears well-developed. No distress.  NAD  HENT:  Mouth/Throat: Oropharynx is clear and moist.  Eyes: Pupils are equal, round, and reactive to light. Conjunctivae are normal.  Neck: Normal range of motion. No JVD present. No thyromegaly present.  Cardiovascular: Normal rate, regular rhythm, normal heart sounds and intact distal pulses. Exam reveals no gallop and no friction rub.  No murmur heard. Pulmonary/Chest: Effort normal and breath sounds normal. No respiratory distress. He has no wheezes. He has no rales. He exhibits no tenderness.  Abdominal: Soft. Bowel sounds are normal. He exhibits no distension and no mass. There is no tenderness. There is no rebound and no guarding.  Musculoskeletal: Normal range of motion. He exhibits tenderness. He exhibits no edema.  Lymphadenopathy:    He has no cervical adenopathy.  Neurological: He is alert and oriented to person, place, and time. He has normal reflexes. No cranial nerve deficit. He exhibits normal muscle tone. He displays a negative Romberg  sign. Coordination abnormal. Gait normal.  Skin: Skin is warm and dry. No rash noted.  Psychiatric: He has a normal mood and affect. His behavior is normal. Judgment and thought content normal.  smollen nares Red TMs Cane, limp  Lab Results  Component Value Date   WBC 10.5 08/25/2015   HGB 15.0 08/25/2015   HCT 46.0 08/25/2015   PLT 381 08/25/2015   GLUCOSE 109 (H) 01/18/2017   CHOL 146 11/19/2013   TRIG 145.0 11/19/2013   HDL 37.80 (L) 11/19/2013   LDLDIRECT 124.5 09/14/2012   LDLCALC 79 11/19/2013   ALT 19 06/17/2016   AST 22 06/17/2016   NA 139 01/18/2017   K 4.0 01/18/2017   CL 102 01/18/2017   CREATININE 1.18 01/18/2017   BUN 14 01/18/2017  CO2 28 01/18/2017   TSH 0.34 (L) 11/19/2013   PSA 2.15 01/18/2017   INR 1.72 (H) 07/05/2010   HGBA1C 6.9 (H) 01/18/2017   MICROALBUR 1.6 07/04/2016    No results found.  Assessment & Plan:   There are no diagnoses linked to this encounter. I have discontinued Harold [name]'s amoxicillin-clavulanate. I am also having him maintain his VOLTAREN, ACCU-CHEK NANO SMARTVIEW, glucose blood, cetirizine, oxyCODONE-acetaminophen, ibuprofen, ammonium lactate, terbinafine, sildenafil, flunisolide, aspirin EC, Na Sulfate-K Sulfate-Mg Sulf, gabapentin, fentaNYL, OVER THE COUNTER MEDICATION, lisinopril, fenofibrate, benzonatate, furosemide, Potassium Chloride ER, fluconazole, terbinafine, tolterodine, amLODipine, citalopram, rosuvastatin, TRUEPLUS LANCETS 33G, and metFORMIN.  No orders of the defined types were placed in this encounter.    Follow-up: No follow-ups on file.  Walker Kehr, MD

## 2017-06-20 NOTE — Assessment & Plan Note (Signed)
Depo-medrol Flonase

## 2017-06-20 NOTE — Assessment & Plan Note (Signed)
Hyperglycemia w/steroids discussed

## 2017-06-20 NOTE — Assessment & Plan Note (Signed)
Refractory Augmentin x 2 weeks Sudafed prn

## 2017-06-21 DIAGNOSIS — M25562 Pain in left knee: Secondary | ICD-10-CM | POA: Diagnosis not present

## 2017-06-25 ENCOUNTER — Other Ambulatory Visit: Payer: Self-pay | Admitting: Family

## 2017-06-25 DIAGNOSIS — I10 Essential (primary) hypertension: Secondary | ICD-10-CM

## 2017-06-26 ENCOUNTER — Other Ambulatory Visit: Payer: Self-pay | Admitting: Nurse Practitioner

## 2017-06-26 ENCOUNTER — Encounter: Payer: Self-pay | Admitting: Nurse Practitioner

## 2017-06-26 DIAGNOSIS — I10 Essential (primary) hypertension: Secondary | ICD-10-CM

## 2017-06-27 ENCOUNTER — Other Ambulatory Visit: Payer: Self-pay | Admitting: Nurse Practitioner

## 2017-06-27 ENCOUNTER — Other Ambulatory Visit: Payer: Self-pay

## 2017-06-27 DIAGNOSIS — M25562 Pain in left knee: Secondary | ICD-10-CM | POA: Diagnosis not present

## 2017-06-27 DIAGNOSIS — I10 Essential (primary) hypertension: Secondary | ICD-10-CM

## 2017-06-27 MED ORDER — POTASSIUM CHLORIDE ER 20 MEQ PO TBCR: 1.0000 | EXTENDED_RELEASE_TABLET | Freq: Every day | ORAL | 0 refills | Status: DC

## 2017-07-18 ENCOUNTER — Encounter: Payer: Self-pay | Admitting: Sports Medicine

## 2017-07-18 ENCOUNTER — Ambulatory Visit (INDEPENDENT_AMBULATORY_CARE_PROVIDER_SITE_OTHER): Payer: Medicare HMO | Admitting: Sports Medicine

## 2017-07-18 DIAGNOSIS — L853 Xerosis cutis: Secondary | ICD-10-CM

## 2017-07-18 DIAGNOSIS — E119 Type 2 diabetes mellitus without complications: Secondary | ICD-10-CM | POA: Diagnosis not present

## 2017-07-18 DIAGNOSIS — B351 Tinea unguium: Secondary | ICD-10-CM | POA: Diagnosis not present

## 2017-07-18 DIAGNOSIS — B359 Dermatophytosis, unspecified: Secondary | ICD-10-CM

## 2017-07-18 DIAGNOSIS — M79672 Pain in left foot: Secondary | ICD-10-CM

## 2017-07-18 DIAGNOSIS — M79671 Pain in right foot: Secondary | ICD-10-CM | POA: Diagnosis not present

## 2017-07-18 MED ORDER — AMMONIUM LACTATE 12 % EX LOTN: 1.0000 "application " | TOPICAL_LOTION | CUTANEOUS | 3 refills | Status: DC | PRN

## 2017-07-18 MED ORDER — TERBINAFINE HCL 1 % EX CREA: 1.0000 "application " | TOPICAL_CREAM | Freq: Every day | CUTANEOUS | 0 refills | Status: DC | PRN

## 2017-07-18 NOTE — Progress Notes (Signed)
Subjective: Harold [name] is a 52 y.o. male patient with history of diabetes who returns to office for diabetic nail care; states that his sugars are controlled. Patient denies any new changes in medication or new problems.   Patient Active Problem List   Diagnosis Date Noted  . Sinus congestion 12/19/2016  . Thrush 08/02/2016  . Sleep disturbance 07/04/2016  . 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  . Urinary frequency 07/24/2015  . Trichomonas exposure 07/24/2015  . Type 2 diabetes mellitus ([place]) 03/04/2015  . Verruca vulgaris 02/24/2015  . Nocturia 02/17/2015  . Accessory skin tags 02/17/2015  . Sinusitis, acute 06/04/2014  . Lumbar radiculopathy, acute 05/23/2014  . Acute left-sided back pain with sciatica 04/10/2014  . Smoker 04/10/2014  . Cough 01/01/2014  . Grief reaction 11/19/2013  . Primary localized osteoarthrosis, lower leg 09/04/2013  . Chronic pain syndrome 07/11/2013  . Hyperlipidemia 07/11/2013  . 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 Gonalgia 11/29/2012  . Preventative health care 09/14/2012  . DDD (degenerative disc disease), lumbar 09/14/2012  . Left sided sciatica 09/14/2012  . Erectile dysfunction 09/14/2012  . Pre-ulcerative corn or callous 09/14/2012  . Hemorrhoids 07/26/2011  . Chronic back pain greater than 3 months duration 09/23/2010  . Abnormal EKG 08/12/2010  . OSA (obstructive sleep apnea) 08/09/2010  . 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 CHF (congestive heart failure) ([place]) 06/29/2010  . Edema 06/10/2010  . Acute idiopathic pericarditis 04/08/2010  . NONSPECIFIC ABNORMAL ELECTROCARDIOGRAM 04/08/2010  . HYPERTRIGLYCERIDEMIA 05/26/2009  . SHOULDER PAIN, LEFT 04/21/2008  . Depression 03/11/2008  . Essential hypertension 03/11/2008  . Gout, unspecified 05/01/2007  . METABOLIC SYNDROME X 99/35/7017  . OBESITY NOS 10/24/2006   Current Outpatient Medications on File Prior to Visit  Medication Sig Dispense Refill  . amLODipine (NORVASC) 10  MG tablet Take 1 tablet (10 mg total) by mouth daily. 90 tablet 0  . amoxicillin-clavulanate (AUGMENTIN) 875-125 MG tablet Take 1 tablet by mouth every 12 (twelve) hours. 28 tablet 0  . aspirin EC 81 MG tablet Take 81 mg by mouth daily.    . benzonatate (TESSALON) 100 MG capsule Take 1 capsule (100 mg total) by mouth 2 (two) times daily as needed for cough. 20 capsule 0  . Blood Glucose Monitoring Suppl (ACCU-CHEK NANO SMARTVIEW) W/DEVICE KIT Use glucose meter to check blood sugar 1-3 times daily as directed. 1 kit 0  . cetirizine (ZYRTEC) 5 MG tablet Take 5 mg by mouth daily as needed for allergies.     . citalopram (CELEXA) 40 MG tablet Take 1 tablet (40 mg total) by mouth daily. 90 tablet 0  . fenofibrate (TRICOR) 145 MG tablet TAKE ONE TABLET BY MOUTH DAILY. 90 tablet 0  . fentaNYL (DURAGESIC - DOSED MCG/HR) 50 MCG/HR Place 50 mcg onto the skin every 3 (three) days.    . fluconazole (DIFLUCAN) 100 MG tablet Take 1 tablet (100 mg total) by mouth daily. TAKE 2 tablets on day 1, THEN 1 tablet daily for the next 9 days 11 tablet 0  . flunisolide (NASALIDE) 25 MCG/ACT (0.025%) SOLN Place 2 sprays into the nose 2 (two) times daily. 1 Bottle 3  . furosemide (LASIX) 40 MG tablet Take 1 tablet (40 mg total) by mouth daily. 90 tablet 1  . gabapentin (NEURONTIN) 300 MG capsule Take 900 mg by mouth 3 (three) times daily.    [name] glucose blood (ACCU-CHEK SMARTVIEW) test strip Use as instructed 100 each 12  . ibuprofen (ADVIL,MOTRIN) 800 MG tablet Take 800 mg by mouth  every 8 (eight) hours as needed.    [name] lisinopril (PRINIVIL,ZESTRIL) 20 MG tablet TAKE ONE TABLET BY MOUTH TWICE A DAY 180 tablet 3  . metFORMIN (GLUCOPHAGE) 500 MG tablet TAKE ONE TABLET BY MOUTH TWICE A DAY WITH A MEAL 60 tablet 0  . Na Sulfate-K Sulfate-Mg Sulf (SUPREP BOWEL PREP KIT) 17.5-3.13-1.6 GM/180ML SOLN suprep as directed.  No substitutions 354 mL 0  . OVER THE COUNTER MEDICATION Take 500 mg by mouth daily.    [name] oxyCODONE-acetaminophen  (PERCOCET) 10-325 MG tablet Take 1 tablet by mouth every 6 (six) hours as needed for pain. 90 tablet 0  . Potassium Chloride ER 20 MEQ TBCR Take 1 tablet by mouth daily. 90 tablet 0  . pseudoephedrine (SUDAFED) 120 MG 12 hr tablet Take 1 tablet (120 mg total) by mouth 2 (two) times daily as needed for congestion. 20 tablet 1  . rosuvastatin (CRESTOR) 20 MG tablet Take 1 tablet (20 mg total) by mouth daily. 90 tablet 0  . sildenafil (REVATIO) 20 MG tablet Take 1-5 tablets (20-100 mg total) by mouth daily as needed. 50 tablet 2  . terbinafine (LAMISIL) 250 MG tablet TAKE ONE TABLET BY MOUTH DAILY 90 tablet 0  . tolterodine (DETROL) 2 MG tablet TAKE ONE TABLET BY MOUTH TWICE A DAY 60 tablet 0  . TRUEPLUS LANCETS 33G MISC Use to check blood sugars twice a day 300 each 1  . VOLTAREN 1 % GEL Apply 2 g topically 4 (four) times daily. 100 g 5   No current facility-administered medications on file prior to visit.    Allergies  Allergen Reactions  . Diphenhydramine Itching, Palpitations and Other (See Comments)    "jittery"  . Other Itching and Swelling    Pecans: itching and swelling of the tongue   . Colchicine Diarrhea  . Lipitor [Atorvastatin] Other (See Comments)    Muscle cramps  . Septra [Sulfamethoxazole-Trimethoprim] Itching    No results found for this or any previous visit (from the past 2160 hour(s)).  Objective: General: Patient is awake, alert, and oriented x 3 and in no acute distress.  Integument: Skin is warm, dry and supple bilateral. Nails are tender, long, thickened and dystrophic with subungual debris, consistent with onychomycosis, 1-5 bilateral. Scaly skin in annular fashion suggestive of tinea resolved with superimposed dry skin that is much improved. No acute signs of infection. No open lesions or preulcerative lesions present bilateral. Remaining integument unremarkable.  Vasculature:  Dorsalis Pedis pulse 2/4 bilateral. Posterior Tibial pulse  2/4 bilateral. Capillary  fill time <3 sec 1-5 bilateral. Positive hair growth to the level of the digits.Temperature gradient within normal limits. No varicosities present bilateral. No edema present bilateral.   Neurology: The patient has intact sensation measured with a 5.07/10g Semmes Weinstein Monofilament at all pedal sites bilateral . Vibratory sensation intact bilateral with tuning fork. No Babinski sign present bilateral.   Musculoskeletal: No symptomatic pedal deformities noted bilateral. Muscular strength 5/5 in all lower extremity muscular groups bilateral without pain on range of motion. No tenderness with calf compression bilateral.  Assessment and Plan: Problem List Items Addressed This Visit    None    Visit Diagnoses    Dermatophytosis of nail    -  Primary   Relevant Medications   terbinafine (LAMISIL AT) 1 % cream   Diabetes mellitus without complication (HCC)       Foot pain, bilateral       Dry skin       Relevant  Medications   ammonium lactate (AMLACTIN) 12 % lotion   terbinafine (LAMISIL AT) 1 % cream   Tinea       Relevant Medications   terbinafine (LAMISIL AT) 1 % cream     -Examined patient. -Discussed and educated patient on diabetic foot care, especially with regards to the vascular, neurological and musculoskeletal systems.  -Stressed the importance of good glycemic control and the detriment of not controlling glucose levels in relation to the foot. -Mechanically debrided all nails 1-5 bilateral using sterile nail nipper and filed with dremel without incident  -Refilled Lamisil cream -Cont with Amlactin lotion for dry skin; Refill provided  -Answered all patient questions -Patient to return in 3 months for at risk foot care -Patient advised to call the office if any problems or questions arise in the meantime.  Landis Martins, DPM

## 2017-07-21 DIAGNOSIS — M25562 Pain in left knee: Secondary | ICD-10-CM | POA: Diagnosis not present

## 2017-08-02 ENCOUNTER — Encounter: Payer: Self-pay | Admitting: Nurse Practitioner

## 2017-08-03 ENCOUNTER — Ambulatory Visit (INDEPENDENT_AMBULATORY_CARE_PROVIDER_SITE_OTHER): Payer: Medicare HMO | Admitting: Internal Medicine

## 2017-08-03 ENCOUNTER — Ambulatory Visit: Payer: Medicare HMO | Admitting: Nurse Practitioner

## 2017-08-03 ENCOUNTER — Encounter: Payer: Self-pay | Admitting: Internal Medicine

## 2017-08-03 VITALS — BP 142/90 | HR 70 | Temp 98.2°F | Ht 71.0 in | Wt 258.4 lb

## 2017-08-03 DIAGNOSIS — J0140 Acute pansinusitis, unspecified: Secondary | ICD-10-CM | POA: Diagnosis not present

## 2017-08-03 DIAGNOSIS — E119 Type 2 diabetes mellitus without complications: Secondary | ICD-10-CM

## 2017-08-03 DIAGNOSIS — I10 Essential (primary) hypertension: Secondary | ICD-10-CM | POA: Diagnosis not present

## 2017-08-03 DIAGNOSIS — F17200 Nicotine dependence, unspecified, uncomplicated: Secondary | ICD-10-CM | POA: Diagnosis not present

## 2017-08-03 MED ORDER — HYDROCODONE-HOMATROPINE 5-1.5 MG/5ML PO SYRP: 5.0000 mL | ORAL_SOLUTION | Freq: Four times a day (QID) | ORAL | 0 refills | Status: AC | PRN

## 2017-08-03 MED ORDER — LEVOFLOXACIN 500 MG PO TABS: 500.0000 mg | ORAL_TABLET | Freq: Every day | ORAL | 0 refills | Status: AC

## 2017-08-03 NOTE — Progress Notes (Signed)
Subjective:    Patient ID: Harold [name], male    DOB: [birth year], 52 y.o.   MRN: [id number]  HPI   Here with 2-3 days acute onset fever, facial pain, pressure, headache, general weakness and malaise, and greenish d/c, with mild ST and cough, but pt denies chest pain, wheezing, increased sob or doe, orthopnea, PND, increased LE swelling, palpitations, dizziness or syncope.  Pt denies new neurological symptoms such as new headache, or facial or extremity weakness or numbness   Pt denies polydipsia, polyuria.   Lost wt with better diet and activity, also trying to stop smoking Wt Readings from Last 3 Encounters:  08/03/17 258 lb 6 oz (117.2 kg)  06/20/17 263 lb (119.3 kg)  01/18/17 268 lb (121.6 kg)   Past Medical History:  Diagnosis Date  . Acute pericarditis    admx 03-26-10 thru 03-29-10; a. echo 03-26-10: EF 55-60%; mild LVH; trivial MR; RVF ok; mild -mod circumferential Eff w/o tamponade  . Allergic rhinitis, cause unspecified 09/14/2012  . Allergy   . Anxiety   . Chronic back pain   . DDD (degenerative disc disease), lumbar 09/14/2012  . Depression   . Diabetes mellitus without complication (HCC)    borderline   . Erectile dysfunction 09/14/2012  . GERD (gastroesophageal reflux disease)   . Gout    pt denies  . Headache    migraines in the past  . History of pancreatitis    a. admx 04-2009.Marland KitchenMarland Kitchen? 2-2 triglycerides  . Hyperlipidemia 07/11/2013  . Hypertension   . Hypertriglyceridemia    a. followed by [facility]  . Left sided sciatica 09/14/2012  . Metabolic syndrome   . Obesity   . OSA (obstructive sleep apnea)    does not use cpap  . Restless legs    Past Surgical History:  Procedure Laterality Date  . COLONOSCOPY    . KNEE SURGERY     right  . LUMBAR LAMINECTOMY/DECOMPRESSION MICRODISCECTOMY N/A 08/26/2015   Procedure: Lumbar Two-Sacral One  Laminectomy for decompression;  Surgeon: [name], MD;  Location: [place];  Service: Neurosurgery;   Laterality: N/A;  . pericardectomy  07/15/2010   Dr. [name]  . pleurx catheter placement  07/15/2010   Dr. [name]  . SHOULDER SURGERY     right and left shoulders  . TONSILLECTOMY AND ADENOIDECTOMY     one tonsil    reports that he has been smoking cigarettes.  He has a 10.00 pack-year smoking history. He has never used smokeless tobacco. He reports that he drinks about 1.2 oz of alcohol per week. He reports that he does not use drugs. family history includes Colon cancer (age of onset: 63) in his father; Diabetes in his father, mother, and sister; Heart disease (age of onset: 27) in his father; Hypertension in his other; Liver cancer in his paternal uncle; Pancreatitis in his father and paternal uncle; Stomach cancer in his maternal grandfather. Allergies  Allergen Reactions  . Diphenhydramine Itching, Palpitations and Other (See Comments)    "jittery"  . Other Itching and Swelling    Pecans: itching and swelling of the tongue   . Colchicine Diarrhea  . Lipitor [Atorvastatin] Other (See Comments)    Muscle cramps  . Septra [Sulfamethoxazole-Trimethoprim] Itching   Current Outpatient Medications on File Prior to Visit  Medication Sig Dispense Refill  . amLODipine (NORVASC) 10 MG tablet Take 1 tablet (10 mg total) by mouth daily. 90 tablet 0  . ammonium lactate (AMLACTIN) 12 %  lotion Apply 1 application topically as needed for dry skin. 400 g 3  . amoxicillin-clavulanate (AUGMENTIN) 875-125 MG tablet Take 1 tablet by mouth every 12 (twelve) hours. 28 tablet 0  . aspirin EC 81 MG tablet Take 81 mg by mouth daily.    . benzonatate (TESSALON) 100 MG capsule Take 1 capsule (100 mg total) by mouth 2 (two) times daily as needed for cough. 20 capsule 0  . Blood Glucose Monitoring Suppl (ACCU-CHEK NANO SMARTVIEW) W/DEVICE KIT Use glucose meter to check blood sugar 1-3 times daily as directed. 1 kit 0  . cetirizine (ZYRTEC) 5 MG tablet Take 5 mg by mouth daily as needed for allergies.     .  citalopram (CELEXA) 40 MG tablet Take 1 tablet (40 mg total) by mouth daily. 90 tablet 0  . fenofibrate (TRICOR) 145 MG tablet TAKE ONE TABLET BY MOUTH DAILY. 90 tablet 0  . fentaNYL (DURAGESIC - DOSED MCG/HR) 50 MCG/HR Place 50 mcg onto the skin every 3 (three) days.    . fluconazole (DIFLUCAN) 100 MG tablet Take 1 tablet (100 mg total) by mouth daily. TAKE 2 tablets on day 1, THEN 1 tablet daily for the next 9 days 11 tablet 0  . flunisolide (NASALIDE) 25 MCG/ACT (0.025%) SOLN Place 2 sprays into the nose 2 (two) times daily. 1 Bottle 3  . furosemide (LASIX) 40 MG tablet Take 1 tablet (40 mg total) by mouth daily. 90 tablet 1  . gabapentin (NEURONTIN) 300 MG capsule Take 900 mg by mouth 3 (three) times daily.    [name] glucose blood (ACCU-CHEK SMARTVIEW) test strip Use as instructed 100 each 12  . ibuprofen (ADVIL,MOTRIN) 800 MG tablet Take 800 mg by mouth every 8 (eight) hours as needed.    [name] lisinopril (PRINIVIL,ZESTRIL) 20 MG tablet TAKE ONE TABLET BY MOUTH TWICE A DAY 180 tablet 3  . metFORMIN (GLUCOPHAGE) 500 MG tablet TAKE ONE TABLET BY MOUTH TWICE A DAY WITH A MEAL 60 tablet 0  . Na Sulfate-K Sulfate-Mg Sulf (SUPREP BOWEL PREP KIT) 17.5-3.13-1.6 GM/180ML SOLN suprep as directed.  No substitutions 354 mL 0  . OVER THE COUNTER MEDICATION Take 500 mg by mouth daily.    [name] oxyCODONE-acetaminophen (PERCOCET) 10-325 MG tablet Take 1 tablet by mouth every 6 (six) hours as needed for pain. 90 tablet 0  . Potassium Chloride ER 20 MEQ TBCR Take 1 tablet by mouth daily. 90 tablet 0  . pseudoephedrine (SUDAFED) 120 MG 12 hr tablet Take 1 tablet (120 mg total) by mouth 2 (two) times daily as needed for congestion. 20 tablet 1  . rosuvastatin (CRESTOR) 20 MG tablet Take 1 tablet (20 mg total) by mouth daily. 90 tablet 0  . sildenafil (REVATIO) 20 MG tablet Take 1-5 tablets (20-100 mg total) by mouth daily as needed. 50 tablet 2  . terbinafine (LAMISIL AT) 1 % cream Apply 1 application topically daily as  needed (foot fungus). 42 g 0  . terbinafine (LAMISIL) 250 MG tablet TAKE ONE TABLET BY MOUTH DAILY 90 tablet 0  . tolterodine (DETROL) 2 MG tablet TAKE ONE TABLET BY MOUTH TWICE A DAY 60 tablet 0  . TRUEPLUS LANCETS 33G MISC Use to check blood sugars twice a day 300 each 1  . VOLTAREN 1 % GEL Apply 2 g topically 4 (four) times daily. 100 g 5   No current facility-administered medications on file prior to visit.    Review of Systems  Constitutional: Negative for other unusual diaphoresis or sweats  HENT: Negative for ear discharge or swelling Eyes: Negative for other worsening visual disturbances Respiratory: Negative for stridor or other swelling  Gastrointestinal: Negative for worsening distension or other blood Genitourinary: Negative for retention or other urinary change Musculoskeletal: Negative for other MSK pain or swelling Skin: Negative for color change or other new lesions Neurological: Negative for worsening tremors and other numbness  Psychiatric/Behavioral: Negative for worsening agitation or other fatigue All other system neg per pt    Objective:   Physical Exam BP (!) 142/90 (BP Location: Left Arm, Patient Position: Sitting, Cuff Size: Large)   Pulse 70   Temp 98.2 F (36.8 C) (Oral)   Ht '5\' 11"'  (1.803 m)   Wt 258 lb 6 oz (117.2 kg)   SpO2 98%   BMI 36.04 kg/m  VS noted, mild ill Constitutional: Pt appears in NAD HENT: Head: NCAT.  Right Ear: External ear normal.  Left Ear: External ear normal.  Bilat tm's with mild erythema.  Max sinus areas mild tender.  Pharynx with mild erythema, no exudate Eyes: . Pupils are equal, round, and reactive to light. Conjunctivae and EOM are normal Nose: without d/c or deformity Neck: Neck supple. Gross normal ROM Cardiovascular: Normal rate and regular rhythm.   Pulmonary/Chest: Effort normal and breath sounds without rales or wheezing.  Neurological: Pt is alert. At baseline orientation, motor grossly intact Skin: Skin is  warm. No rashes, other new lesions, no LE edema Psychiatric: Pt behavior is normal without agitation  No other exam findings Lab Results  Component Value Date   WBC 10.5 08/25/2015   HGB 15.0 08/25/2015   HCT 46.0 08/25/2015   PLT 381 08/25/2015   GLUCOSE 109 (H) 01/18/2017   CHOL 146 11/19/2013   TRIG 145.0 11/19/2013   HDL 37.80 (L) 11/19/2013   LDLDIRECT 124.5 09/14/2012   LDLCALC 79 11/19/2013   ALT 19 06/17/2016   AST 22 06/17/2016   NA 139 01/18/2017   K 4.0 01/18/2017   CL 102 01/18/2017   CREATININE 1.18 01/18/2017   BUN 14 01/18/2017   CO2 28 01/18/2017   TSH 0.34 (L) 11/19/2013   PSA 2.15 01/18/2017   INR 1.72 (H) 07/05/2010   HGBA1C 6.9 (H) 01/18/2017   MICROALBUR 1.6 07/04/2016        Assessment & Plan:

## 2017-08-03 NOTE — Assessment & Plan Note (Signed)
stable overall by history and exam, recent data reviewed with pt, and pt to continue medical treatment as before,  to f/u any worsening symptoms or concerns BP Readings from Last 3 Encounters:  08/03/17 (!) 142/90  06/20/17 128/82  06/03/17 (!) 150/94

## 2017-08-03 NOTE — Assessment & Plan Note (Signed)
stable overall by history and exam, recent data reviewed with pt, and pt to continue medical treatment as before,  to f/u any worsening symptoms or concerns Lab Results  Component Value Date   HGBA1C 6.9 (H) 01/18/2017   

## 2017-08-03 NOTE — Assessment & Plan Note (Signed)
Mild to mod, for antibx course,  to f/u any worsening symptoms or concerns 

## 2017-08-03 NOTE — Assessment & Plan Note (Signed)
Encouraged efforts to quit

## 2017-08-03 NOTE — Patient Instructions (Signed)
Please take all new medication as prescribed - the antibiotic, and cough medicine  Please continue all other medications as before, and refills have been done if requested.  Please have the pharmacy call with any other refills you may need.  Please keep your appointments with your specialists as you may have planned      

## 2017-08-06 NOTE — Progress Notes (Addendum)
Cardiology Office Note   Date:  08/07/2017   ID:  Harold [name], DOB 1965/10/07, MRN [id number]  PCP:  Biagio Borg, MD  Cardiologist:   Minus Breeding, MD   Chief Complaint  Patient presents with  . Hypertension      History of Present Illness: Harold [name] is a 52 y.o. male who presents for evaluation of multiple cardiovascular risk factors.  I saw him in the past for pericarditis.    In 2017 he had a POET (Plain Old Exercise Treadmill) after the last visit.  He had no ischemia but did have a hypertensive response.     Since I last saw him he has done well.  He is exercising about 3 hours per week per his report.  The patient denies any new symptoms such as chest discomfort, neck or arm discomfort. There has been no new shortness of breath, PND or orthopnea. There have been no reported palpitations, presyncope or syncope.      Past Medical History:  Diagnosis Date  . Acute pericarditis    admx 03-26-10 thru 03-29-10; a. echo 03-26-10: EF 55-60%; mild LVH; trivial MR; RVF ok; mild -mod circumferential Eff w/o tamponade  . Allergic rhinitis, cause unspecified 09/14/2012  . Allergy   . Anxiety   . Chronic back pain   . DDD (degenerative disc disease), lumbar 09/14/2012  . Depression   . Diabetes mellitus without complication (HCC)    borderline   . Erectile dysfunction 09/14/2012  . GERD (gastroesophageal reflux disease)   . Gout    pt denies  . Headache    migraines in the past  . History of pancreatitis    a. admx 04-2009.Marland KitchenMarland Kitchen? 2-2 triglycerides  . Hyperlipidemia 07/11/2013  . Hypertension   . Hypertriglyceridemia    a. followed by [facility]  . Left sided sciatica 09/14/2012  . Metabolic syndrome   . Obesity   . OSA (obstructive sleep apnea)    CPAP  . Restless legs     Past Surgical History:  Procedure Laterality Date  . COLONOSCOPY    . KNEE SURGERY     right  . LUMBAR LAMINECTOMY/DECOMPRESSION MICRODISCECTOMY N/A 08/26/2015   Procedure: Lumbar  Two-Sacral One  Laminectomy for decompression;  Surgeon: [name], MD;  Location: [facility];  Service: Neurosurgery;  Laterality: N/A;  . pericardectomy  07/15/2010   Dr. [name]  . pleurx catheter placement  07/15/2010   Dr. [name]  . SHOULDER SURGERY     right and left shoulders  . TONSILLECTOMY AND ADENOIDECTOMY     one tonsil     Current Outpatient Medications  Medication Sig Dispense Refill  . amLODipine (NORVASC) 10 MG tablet Take 1 tablet (10 mg total) by mouth daily. 90 tablet 0  . ammonium lactate (AMLACTIN) 12 % lotion Apply 1 application topically as needed for dry skin. 400 g 3  . aspirin EC 81 MG tablet Take 81 mg by mouth daily.    . benzonatate (TESSALON) 100 MG capsule Take 1 capsule (100 mg total) by mouth 2 (two) times daily as needed for cough. 20 capsule 0  . Blood Glucose Monitoring Suppl (ACCU-CHEK NANO SMARTVIEW) W/DEVICE KIT Use glucose meter to check blood sugar 1-3 times daily as directed. 1 kit 0  . cetirizine (ZYRTEC) 5 MG tablet Take 5 mg by mouth daily as needed for allergies.     . citalopram (CELEXA) 40 MG tablet Take 1 tablet (40 mg total) by  mouth daily. 90 tablet 0  . fenofibrate (TRICOR) 145 MG tablet TAKE ONE TABLET BY MOUTH DAILY. 90 tablet 0  . fentaNYL (DURAGESIC - DOSED MCG/HR) 50 MCG/HR Place 50 mcg onto the skin every 3 (three) days.    . flunisolide (NASALIDE) 25 MCG/ACT (0.025%) SOLN Place 2 sprays into the nose 2 (two) times daily. 1 Bottle 3  . furosemide (LASIX) 40 MG tablet Take 1 tablet (40 mg total) by mouth daily. 90 tablet 1  . gabapentin (NEURONTIN) 300 MG capsule Take 900 mg by mouth 3 (three) times daily.    [name] glucose blood (ACCU-CHEK SMARTVIEW) test strip Use as instructed 100 each 12  . HYDROcodone-homatropine (HYCODAN) 5-1.5 MG/5ML syrup Take 5 mLs by mouth every 6 (six) hours as needed for up to 10 days for cough. 180 mL 0  . ibuprofen (ADVIL,MOTRIN) 800 MG tablet Take 800 mg by mouth every 8 (eight) hours as  needed.    [name] levofloxacin (LEVAQUIN) 500 MG tablet Take 1 tablet (500 mg total) by mouth daily for 10 days. 10 tablet 0  . lisinopril (PRINIVIL,ZESTRIL) 20 MG tablet TAKE ONE TABLET BY MOUTH TWICE A DAY 180 tablet 3  . metFORMIN (GLUCOPHAGE) 500 MG tablet TAKE ONE TABLET BY MOUTH TWICE A DAY WITH A MEAL 60 tablet 0  . OVER THE COUNTER MEDICATION Take 500 mg by mouth daily.    [name] oxyCODONE-acetaminophen (PERCOCET) 10-325 MG tablet Take 1 tablet by mouth every 6 (six) hours as needed for pain. 90 tablet 0  . Potassium Chloride ER 20 MEQ TBCR Take 1 tablet by mouth daily. 90 tablet 0  . pseudoephedrine (SUDAFED) 120 MG 12 hr tablet Take 1 tablet (120 mg total) by mouth 2 (two) times daily as needed for congestion. 20 tablet 1  . rosuvastatin (CRESTOR) 20 MG tablet Take 1 tablet (20 mg total) by mouth daily. 90 tablet 0  . sildenafil (REVATIO) 20 MG tablet Take 1-5 tablets (20-100 mg total) by mouth daily as needed. 50 tablet 2  . terbinafine (LAMISIL AT) 1 % cream Apply 1 application topically daily as needed (foot fungus). 42 g 0  . terbinafine (LAMISIL) 250 MG tablet TAKE ONE TABLET BY MOUTH DAILY 90 tablet 0  . tolterodine (DETROL) 2 MG tablet TAKE ONE TABLET BY MOUTH TWICE A DAY 60 tablet 0  . TRUEPLUS LANCETS 33G MISC Use to check blood sugars twice a day 300 each 1  . VOLTAREN 1 % GEL Apply 2 g topically 4 (four) times daily. 100 g 5   No current facility-administered medications for this visit.     Allergies:   Diphenhydramine; Other; Colchicine; Lipitor [atorvastatin]; and Septra [sulfamethoxazole-trimethoprim]    ROS:   As stated in the HPI and negative for all other systems.   PHYSICAL EXAM: VS:  BP 140/82   Pulse 72   Ht _0  (1.803 m)   Wt 259 lb (117.5 kg)   BMI 36.12 kg/m  , BMI Body mass index is 36.12 kg/m.  GENERAL:  Well appearing NECK:  No jugular venous distention, waveform within normal limits, carotid upstroke brisk and symmetric, no bruits, no  thyromegaly LUNGS:  Clear to auscultation bilaterally CHEST:  Unremarkable HEART:  PMI not displaced or sustained,S1 and S2 within normal limits, no S3, no S4, no clicks, no rubs, none murmurs ABD:  Flat, positive bowel sounds normal in frequency in pitch, no bruits, no rebound, no guarding, no midline pulsatile mass, no hepatomegaly, no splenomegaly EXT:  2 plus  pulses throughout, no edema, no cyanosis no clubbing  EKG:  EKG is  ordered today. Sinus rhythm, right axis deviation, rate 72, poor anterior R wave progression, no acute ST-T wave changes.    Recent Labs: 01/18/2017: BUN 14; Creatinine, Ser 1.18; Potassium 4.0; Sodium 139   Lab Results  Component Value Date   CHOL 146 11/19/2013   TRIG 145.0 11/19/2013   HDL 37.80 (L) 11/19/2013   LDLCALC 79 11/19/2013   LDLDIRECT 124.5 09/14/2012     Lab Results  Component Value Date   HGBA1C 6.9 (H) 01/18/2017    Wt Readings from Last 3 Encounters:  08/07/17 259 lb (117.5 kg)  08/03/17 258 lb 6 oz (117.2 kg)  06/20/17 263 lb (119.3 kg)      Other studies Reviewed: Additional studies/ records that were reviewed today include: None Review of the above records demonstrates:        ASSESSMENT AND PLAN:   OBESITY:     He has lost about 20 lbs.  We set a goal of 239 which would be the lowest weight recorded with [place].    DM:   His A1c was 6.9  recently.   I will defer to Biagio Borg, MD  RISK REDUCTION:     We talked again about the cigarettes and he has patches.  I asked him to call [place].   This is his number one health priority.   HTN:  BP is controlled barely and will come down with weight loss.    SLEEP APNEA:   He doesn't think the CPAP works well and he would like to switch his sleep apnea care.  I will arrange follow up.  Noncompliant because the patient is noncompliant and has not used his BiPAP machine he has to have another sleep study.  Per Medicare guidelines.  Current medicines are reviewed at length  with the patient today.  The patient does not have concerns regarding medicines.  The following changes have been made:   New sleep appt.   Labs/ tests ordered today include:  None  Orders Placed This Encounter  Procedures  . EKG 12-Lead     Disposition:   FU with me in 12  months.    Signed, Minus Breeding, MD  08/07/2017 11:46 AM    [facility]

## 2017-08-07 ENCOUNTER — Encounter: Payer: Self-pay | Admitting: Cardiology

## 2017-08-07 ENCOUNTER — Ambulatory Visit (INDEPENDENT_AMBULATORY_CARE_PROVIDER_SITE_OTHER): Payer: Medicare HMO | Admitting: Cardiology

## 2017-08-07 ENCOUNTER — Telehealth: Payer: Self-pay | Admitting: *Deleted

## 2017-08-07 ENCOUNTER — Other Ambulatory Visit: Payer: Self-pay | Admitting: Cardiology

## 2017-08-07 VITALS — BP 140/82 | HR 72 | Ht 71.0 in | Wt 259.0 lb

## 2017-08-07 DIAGNOSIS — I10 Essential (primary) hypertension: Secondary | ICD-10-CM | POA: Diagnosis not present

## 2017-08-07 DIAGNOSIS — E663 Overweight: Secondary | ICD-10-CM | POA: Diagnosis not present

## 2017-08-07 DIAGNOSIS — Z720 Tobacco use: Secondary | ICD-10-CM | POA: Diagnosis not present

## 2017-08-07 DIAGNOSIS — G4730 Sleep apnea, unspecified: Secondary | ICD-10-CM | POA: Diagnosis not present

## 2017-08-07 DIAGNOSIS — G4733 Obstructive sleep apnea (adult) (pediatric): Secondary | ICD-10-CM

## 2017-08-07 NOTE — Telephone Encounter (Signed)
Submitted PA for in lab sleep study to [facility].

## 2017-08-07 NOTE — Patient Instructions (Signed)
Medication Instructions:  Continue current medications  If you need a refill on your cardiac medications before your next appointment, please call your pharmacy.  Labwork: None Ordered   Testing/Procedures: None Ordered  Special Instructions: [email or web address]-QUIT-NOW  Follow-Up: Your physician wants you to follow-up in: 1 Year. You should receive a reminder letter in the mail two months in advance. If you do not receive a letter, please call our office [phone].     Thank you for choosing [facility]!!      

## 2017-08-08 ENCOUNTER — Telehealth: Payer: Self-pay | Admitting: Emergency Medicine

## 2017-08-08 ENCOUNTER — Other Ambulatory Visit: Payer: Self-pay | Admitting: Cardiology

## 2017-08-08 ENCOUNTER — Telehealth: Payer: Self-pay | Admitting: *Deleted

## 2017-08-08 DIAGNOSIS — G4733 Obstructive sleep apnea (adult) (pediatric): Secondary | ICD-10-CM

## 2017-08-08 NOTE — Telephone Encounter (Signed)
Received a call from patient's insurance informing me the in lab sleep study has been denied. Patient doesn't meet criteria. They recommended to withdraw request and order HST which does not need a PA.  In lab request withdrawn and HST ordered.

## 2017-08-08 NOTE — Telephone Encounter (Signed)
Called patient to schedule AWV. Patient declined at this time. 

## 2017-08-09 ENCOUNTER — Encounter: Payer: Self-pay | Admitting: Internal Medicine

## 2017-08-10 ENCOUNTER — Telehealth: Payer: Self-pay | Admitting: *Deleted

## 2017-08-10 NOTE — Telephone Encounter (Signed)
Patient notified insurance denied in lab sleep study. HST scheduled on 09/12/17 @ 2:00 pm.

## 2017-08-17 ENCOUNTER — Other Ambulatory Visit: Payer: Self-pay | Admitting: Nurse Practitioner

## 2017-08-30 ENCOUNTER — Other Ambulatory Visit: Payer: Self-pay | Admitting: Nurse Practitioner

## 2017-08-31 DIAGNOSIS — M5116 Intervertebral disc disorders with radiculopathy, lumbar region: Secondary | ICD-10-CM | POA: Diagnosis not present

## 2017-09-12 ENCOUNTER — Encounter (HOSPITAL_BASED_OUTPATIENT_CLINIC_OR_DEPARTMENT_OTHER): Payer: Medicare HMO

## 2017-09-12 DIAGNOSIS — E119 Type 2 diabetes mellitus without complications: Secondary | ICD-10-CM | POA: Diagnosis not present

## 2017-09-12 DIAGNOSIS — H5203 Hypermetropia, bilateral: Secondary | ICD-10-CM | POA: Diagnosis not present

## 2017-09-12 DIAGNOSIS — H524 Presbyopia: Secondary | ICD-10-CM | POA: Diagnosis not present

## 2017-09-12 DIAGNOSIS — Z7984 Long term (current) use of oral hypoglycemic drugs: Secondary | ICD-10-CM | POA: Diagnosis not present

## 2017-09-12 DIAGNOSIS — H52223 Regular astigmatism, bilateral: Secondary | ICD-10-CM | POA: Diagnosis not present

## 2017-09-12 DIAGNOSIS — H52203 Unspecified astigmatism, bilateral: Secondary | ICD-10-CM | POA: Diagnosis not present

## 2017-09-12 DIAGNOSIS — Z0100 Encounter for examination of eyes and vision without abnormal findings: Secondary | ICD-10-CM | POA: Diagnosis not present

## 2017-09-12 DIAGNOSIS — I10 Essential (primary) hypertension: Secondary | ICD-10-CM | POA: Diagnosis not present

## 2017-09-12 LAB — HM DIABETES EYE EXAM

## 2017-09-18 ENCOUNTER — Encounter (HOSPITAL_BASED_OUTPATIENT_CLINIC_OR_DEPARTMENT_OTHER): Payer: Medicare HMO

## 2017-09-20 DIAGNOSIS — M25561 Pain in right knee: Secondary | ICD-10-CM | POA: Diagnosis not present

## 2017-09-20 DIAGNOSIS — M5116 Intervertebral disc disorders with radiculopathy, lumbar region: Secondary | ICD-10-CM | POA: Diagnosis not present

## 2017-09-28 ENCOUNTER — Other Ambulatory Visit: Payer: Self-pay | Admitting: Family

## 2017-10-01 ENCOUNTER — Other Ambulatory Visit: Payer: Self-pay | Admitting: Family

## 2017-10-01 DIAGNOSIS — N529 Male erectile dysfunction, unspecified: Secondary | ICD-10-CM

## 2017-10-02 ENCOUNTER — Other Ambulatory Visit: Payer: Self-pay

## 2017-10-02 MED ORDER — LISINOPRIL 20 MG PO TABS: 20.0000 mg | ORAL_TABLET | Freq: Two times a day (BID) | ORAL | 1 refills | Status: DC

## 2017-10-04 ENCOUNTER — Encounter: Payer: Self-pay | Admitting: Internal Medicine

## 2017-10-04 DIAGNOSIS — N529 Male erectile dysfunction, unspecified: Secondary | ICD-10-CM

## 2017-10-04 MED ORDER — SILDENAFIL CITRATE 20 MG PO TABS: 20.0000 mg | ORAL_TABLET | Freq: Every day | ORAL | 2 refills | Status: DC | PRN

## 2017-10-04 NOTE — Telephone Encounter (Signed)
Done erx 

## 2017-10-17 ENCOUNTER — Encounter: Payer: Self-pay | Admitting: Sports Medicine

## 2017-10-17 ENCOUNTER — Ambulatory Visit (INDEPENDENT_AMBULATORY_CARE_PROVIDER_SITE_OTHER): Payer: Medicare HMO | Admitting: Sports Medicine

## 2017-10-17 DIAGNOSIS — M5440 Lumbago with sciatica, unspecified side: Secondary | ICD-10-CM | POA: Diagnosis not present

## 2017-10-17 DIAGNOSIS — E119 Type 2 diabetes mellitus without complications: Secondary | ICD-10-CM

## 2017-10-17 DIAGNOSIS — B351 Tinea unguium: Secondary | ICD-10-CM

## 2017-10-17 DIAGNOSIS — M79672 Pain in left foot: Secondary | ICD-10-CM

## 2017-10-17 DIAGNOSIS — L853 Xerosis cutis: Secondary | ICD-10-CM

## 2017-10-17 DIAGNOSIS — M79671 Pain in right foot: Secondary | ICD-10-CM | POA: Diagnosis not present

## 2017-10-17 DIAGNOSIS — I509 Heart failure, unspecified: Secondary | ICD-10-CM | POA: Diagnosis not present

## 2017-10-17 NOTE — Progress Notes (Signed)
Subjective: Harold [name] is a 52 y.o. male patient with history of diabetes who returns to office for diabetic nail care; states that his sugars are controlled, not checked today last A1c 6.9. Patient denies any new changes in medication or new problems.   Patient Active Problem List   Diagnosis Date Noted  . Sinus congestion 12/19/2016  . Thrush 08/02/2016  . Sleep disturbance 07/04/2016  . 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  . Urinary frequency 07/24/2015  . Trichomonas exposure 07/24/2015  . Type 2 diabetes mellitus ([place]) 03/04/2015  . Verruca vulgaris 02/24/2015  . Nocturia 02/17/2015  . Accessory skin tags 02/17/2015  . Sinusitis, acute 06/04/2014  . Lumbar radiculopathy, acute 05/23/2014  . Acute left-sided back pain with sciatica 04/10/2014  . Smoker 04/10/2014  . Cough 01/01/2014  . Grief reaction 11/19/2013  . Primary localized osteoarthrosis, lower leg 09/04/2013  . Chronic pain syndrome 07/11/2013  . Hyperlipidemia 07/11/2013  . 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 Gonalgia 11/29/2012  . Preventative health care 09/14/2012  . DDD (degenerative disc disease), lumbar 09/14/2012  . Left sided sciatica 09/14/2012  . Erectile dysfunction 09/14/2012  . Pre-ulcerative corn or callous 09/14/2012  . Hemorrhoids 07/26/2011  . Chronic back pain greater than 3 months duration 09/23/2010  . Abnormal EKG 08/12/2010  . OSA (obstructive sleep apnea) 08/09/2010  . 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 CHF (congestive heart failure) ([place]) 06/29/2010  . Edema 06/10/2010  . Acute idiopathic pericarditis 04/08/2010  . NONSPECIFIC ABNORMAL ELECTROCARDIOGRAM 04/08/2010  . HYPERTRIGLYCERIDEMIA 05/26/2009  . SHOULDER PAIN, LEFT 04/21/2008  . Depression 03/11/2008  . Essential hypertension 03/11/2008  . Gout, unspecified 05/01/2007  . METABOLIC SYNDROME X 42/39/5320  . OBESITY NOS 10/24/2006   Current Outpatient Medications on File Prior to Visit  Medication Sig Dispense  Refill  . amLODipine (NORVASC) 10 MG tablet TAKE ONE TABLET BY MOUTH DAILY 90 tablet 0  . ammonium lactate (AMLACTIN) 12 % lotion Apply 1 application topically as needed for dry skin. 400 g 3  . aspirin EC 81 MG tablet Take 81 mg by mouth daily.    . benzonatate (TESSALON) 100 MG capsule Take 1 capsule (100 mg total) by mouth 2 (two) times daily as needed for cough. 20 capsule 0  . Blood Glucose Monitoring Suppl (ACCU-CHEK NANO SMARTVIEW) W/DEVICE KIT Use glucose meter to check blood sugar 1-3 times daily as directed. 1 kit 0  . cetirizine (ZYRTEC) 5 MG tablet Take 5 mg by mouth daily as needed for allergies.     . citalopram (CELEXA) 40 MG tablet TAKE ONE TABLET BY MOUTH DAILY 90 tablet 0  . fenofibrate (TRICOR) 145 MG tablet TAKE ONE TABLET BY MOUTH DAILY. 90 tablet 0  . fentaNYL (DURAGESIC - DOSED MCG/HR) 50 MCG/HR Place 50 mcg onto the skin every 3 (three) days.    . flunisolide (NASALIDE) 25 MCG/ACT (0.025%) SOLN Place 2 sprays into the nose 2 (two) times daily. 1 Bottle 3  . furosemide (LASIX) 40 MG tablet Take 1 tablet (40 mg total) by mouth daily. 90 tablet 1  . gabapentin (NEURONTIN) 300 MG capsule Take 900 mg by mouth 3 (three) times daily.    [name] glucose blood (ACCU-CHEK SMARTVIEW) test strip Use as instructed 100 each 12  . ibuprofen (ADVIL,MOTRIN) 800 MG tablet Take 800 mg by mouth every 8 (eight) hours as needed.    [name] lisinopril (PRINIVIL,ZESTRIL) 20 MG tablet Take 1 tablet (20 mg total) by mouth 2 (two) times daily. 180 tablet 1  . metFORMIN (  GLUCOPHAGE) 500 MG tablet TAKE ONE TABLET BY MOUTH TWICE A DAY WITH A MEAL 60 tablet 0  . OVER THE COUNTER MEDICATION Take 500 mg by mouth daily.    [name] oxyCODONE-acetaminophen (PERCOCET) 10-325 MG tablet Take 1 tablet by mouth every 6 (six) hours as needed for pain. 90 tablet 0  . Potassium Chloride ER 20 MEQ TBCR Take 1 tablet by mouth daily. 90 tablet 0  . pseudoephedrine (SUDAFED) 120 MG 12 hr tablet Take 1 tablet (120 mg total) by mouth 2  (two) times daily as needed for congestion. 20 tablet 1  . rosuvastatin (CRESTOR) 20 MG tablet TAKE ONE TABLET BY MOUTH DAILY 90 tablet 0  . sildenafil (REVATIO) 20 MG tablet Take 1-5 tablets (20-100 mg total) by mouth daily as needed. 50 tablet 2  . terbinafine (LAMISIL AT) 1 % cream Apply 1 application topically daily as needed (foot fungus). 42 g 0  . terbinafine (LAMISIL) 250 MG tablet TAKE ONE TABLET BY MOUTH DAILY 90 tablet 0  . tolterodine (DETROL) 2 MG tablet TAKE ONE TABLET BY MOUTH TWICE A DAY 60 tablet 0  . TRUEPLUS LANCETS 33G MISC Use to check blood sugars twice a day 300 each 1  . VOLTAREN 1 % GEL Apply 2 g topically 4 (four) times daily. 100 g 5   No current facility-administered medications on file prior to visit.    Allergies  Allergen Reactions  . Diphenhydramine Itching, Palpitations and Other (See Comments)    "jittery"  . Other Itching and Swelling    Pecans: itching and swelling of the tongue   . Colchicine Diarrhea  . Lipitor [Atorvastatin] Other (See Comments)    Muscle cramps  . Septra [Sulfamethoxazole-Trimethoprim] Itching    Recent Results (from the past 2160 hour(s))  HM DIABETES EYE EXAM     Status: None   Collection Time: 09/12/17 12:00 AM  Result Value Ref Range   HM Diabetic Eye Exam No Retinopathy No Retinopathy    Objective: General: Patient is awake, alert, and oriented x 3 and in no acute distress.  Integument: Skin is warm, dry and supple bilateral. Nails are tender, long, thickened and dystrophic with subungual debris, consistent with onychomycosis, 1-5 bilateral. No acute signs of infection. No open lesions or preulcerative lesions present bilateral. Remaining integument unremarkable.  Vasculature:  Dorsalis Pedis pulse 2/4 bilateral. Posterior Tibial pulse  2/4 bilateral. Capillary fill time <3 sec 1-5 bilateral. Positive hair growth to the level of the digits.Temperature gradient within normal limits. No varicosities present bilateral. No  edema present bilateral.   Neurology: The patient has intact sensation measured with a 5.07/10g Semmes Weinstein Monofilament at all pedal sites bilateral . Vibratory sensation intact bilateral with tuning fork. No Babinski sign present bilateral.   Musculoskeletal: No symptomatic pedal deformities noted bilateral. Muscular strength 5/5 in all lower extremity muscular groups bilateral without pain on range of motion. No tenderness with calf compression bilateral.  Assessment and Plan: Problem List Items Addressed This Visit    None    Visit Diagnoses    Diabetes mellitus without complication (HCC)    -  Primary   Dermatophytosis of nail       Foot pain, bilateral       Dry skin         -Examined patient. -Discussed and educated patient on diabetic foot care, especially with regards to the vascular, neurological and musculoskeletal systems.  -Stressed the importance of good glycemic control and the detriment of not controlling  glucose levels in relation to the foot. -Mechanically debrided all nails 1-5 bilateral using sterile nail nipper and filed with dremel without incident  -Continue Lamisil cream to prevent against fungus  -Continue with Amlactin lotion for dry skin  -Answered all patient questions -Patient to return in 3 months for at risk foot care -Patient advised to call the office if any problems or questions arise in the meantime.  Landis Martins, DPM

## 2017-10-21 ENCOUNTER — Other Ambulatory Visit: Payer: Self-pay | Admitting: Family

## 2017-10-21 DIAGNOSIS — I10 Essential (primary) hypertension: Secondary | ICD-10-CM

## 2017-10-26 ENCOUNTER — Telehealth: Payer: Self-pay | Admitting: *Deleted

## 2017-10-26 ENCOUNTER — Encounter: Payer: Self-pay | Admitting: Internal Medicine

## 2017-10-26 DIAGNOSIS — I10 Essential (primary) hypertension: Secondary | ICD-10-CM

## 2017-10-26 NOTE — Telephone Encounter (Signed)
I called patient to inform him that we will need to cancel his upcoming sleep appointment scheduled with Dr Claiborne Billings on 11/23/17. I see that he has cancelled his HST twice. I asked if he plans to reschedule this. He states that he does. I asked if he plans to reschedule this or would he prefer that I do it. He requested for me to reschedule it. It has been rescheduled for 11/17/17. Patient  notified of the new appointment.  Once he has completed the sleep study we will get his appointment with Dr Claiborne Billings rescheduled.

## 2017-10-27 MED ORDER — POTASSIUM CHLORIDE ER 20 MEQ PO TBCR: 1.0000 | EXTENDED_RELEASE_TABLET | Freq: Every day | ORAL | 0 refills | Status: DC

## 2017-11-04 ENCOUNTER — Other Ambulatory Visit: Payer: Self-pay | Admitting: Neurosurgery

## 2017-11-04 DIAGNOSIS — M5440 Lumbago with sciatica, unspecified side: Secondary | ICD-10-CM

## 2017-11-17 ENCOUNTER — Ambulatory Visit: Payer: Medicare HMO | Admitting: Internal Medicine

## 2017-11-17 ENCOUNTER — Ambulatory Visit (HOSPITAL_BASED_OUTPATIENT_CLINIC_OR_DEPARTMENT_OTHER): Payer: Medicare HMO | Attending: Cardiology | Admitting: Cardiovascular Disease

## 2017-11-17 DIAGNOSIS — Z7982 Long term (current) use of aspirin: Secondary | ICD-10-CM | POA: Insufficient documentation

## 2017-11-17 DIAGNOSIS — G4733 Obstructive sleep apnea (adult) (pediatric): Secondary | ICD-10-CM | POA: Diagnosis not present

## 2017-11-17 DIAGNOSIS — R0902 Hypoxemia: Secondary | ICD-10-CM | POA: Insufficient documentation

## 2017-11-17 DIAGNOSIS — R0683 Snoring: Secondary | ICD-10-CM | POA: Insufficient documentation

## 2017-11-17 DIAGNOSIS — Z7984 Long term (current) use of oral hypoglycemic drugs: Secondary | ICD-10-CM | POA: Diagnosis not present

## 2017-11-17 DIAGNOSIS — Z79899 Other long term (current) drug therapy: Secondary | ICD-10-CM | POA: Insufficient documentation

## 2017-11-18 ENCOUNTER — Inpatient Hospital Stay: Admission: RE | Admit: 2017-11-18 | Payer: Medicare HMO | Source: Ambulatory Visit

## 2017-11-21 ENCOUNTER — Other Ambulatory Visit: Payer: Self-pay | Admitting: Nurse Practitioner

## 2017-11-22 ENCOUNTER — Ambulatory Visit: Payer: Medicare HMO | Admitting: Internal Medicine

## 2017-11-22 DIAGNOSIS — Z0289 Encounter for other administrative examinations: Secondary | ICD-10-CM

## 2017-11-23 ENCOUNTER — Ambulatory Visit: Payer: Medicare HMO | Admitting: Cardiovascular Disease

## 2017-11-28 DIAGNOSIS — M5116 Intervertebral disc disorders with radiculopathy, lumbar region: Secondary | ICD-10-CM | POA: Diagnosis not present

## 2017-11-28 DIAGNOSIS — M5440 Lumbago with sciatica, unspecified side: Secondary | ICD-10-CM | POA: Diagnosis not present

## 2017-11-28 DIAGNOSIS — M25561 Pain in right knee: Secondary | ICD-10-CM | POA: Diagnosis not present

## 2017-11-29 ENCOUNTER — Other Ambulatory Visit: Payer: Medicare HMO

## 2017-11-29 DIAGNOSIS — M5440 Lumbago with sciatica, unspecified side: Secondary | ICD-10-CM | POA: Diagnosis not present

## 2017-11-29 DIAGNOSIS — M5126 Other intervertebral disc displacement, lumbar region: Secondary | ICD-10-CM | POA: Diagnosis not present

## 2017-11-29 DIAGNOSIS — M48061 Spinal stenosis, lumbar region without neurogenic claudication: Secondary | ICD-10-CM | POA: Diagnosis not present

## 2017-12-03 NOTE — Progress Notes (Signed)
Error

## 2017-12-14 DIAGNOSIS — M25562 Pain in left knee: Secondary | ICD-10-CM | POA: Diagnosis not present

## 2017-12-15 ENCOUNTER — Encounter: Payer: Self-pay | Admitting: Internal Medicine

## 2017-12-17 ENCOUNTER — Encounter (HOSPITAL_BASED_OUTPATIENT_CLINIC_OR_DEPARTMENT_OTHER): Payer: Self-pay | Admitting: Cardiovascular Disease

## 2017-12-17 NOTE — Procedures (Signed)
Patient Name: Harold [name], Harold [name] Date: 11/20/2017 Gender: Male D.O.B: 06-15-1965 Age (years): 44 Referring Provider: Minus Breeding Height (inches): 18 Interpreting Physician: Shelva Majestic MD, ABSM Weight (lbs): 252 RPSGT: Jacolyn Reedy BMI: 35 MRN: [id number] Neck Size: 19.50  CLINICAL INFORMATION Sleep Study Type: HST  Indication for sleep study: OSA  Epworth Sleepiness Score: 4  SLEEP STUDY TECHNIQUE A multi-channel overnight portable sleep study was performed. The channels recorded were: nasal airflow, thoracic respiratory movement, and oxygen saturation with a pulse oximetry. Snoring was also monitored.  MEDICATIONS     amLODipine (NORVASC) 10 MG tablet         ammonium lactate (AMLACTIN) 12 % lotion         aspirin EC 81 MG tablet         benzonatate (TESSALON) 100 MG capsule         Blood Glucose Monitoring Suppl (ACCU-CHEK NANO SMARTVIEW) W/DEVICE KIT         cetirizine (ZYRTEC) 5 MG tablet         citalopram (CELEXA) 40 MG tablet         fenofibrate (TRICOR) 145 MG tablet         fentaNYL (DURAGESIC - DOSED MCG/HR) 50 MCG/HR         flunisolide (NASALIDE) 25 MCG/ACT (0.025%) SOLN         furosemide (LASIX) 40 MG tablet         gabapentin (NEURONTIN) 300 MG capsule         glucose blood (ACCU-CHEK SMARTVIEW) test strip         ibuprofen (ADVIL,MOTRIN) 800 MG tablet         lisinopril (PRINIVIL,ZESTRIL) 20 MG tablet         metFORMIN (GLUCOPHAGE) 500 MG tablet         OVER THE COUNTER MEDICATION         oxyCODONE-acetaminophen (PERCOCET) 10-325 MG tablet         Potassium Chloride ER 20 MEQ TBCR         pseudoephedrine (SUDAFED) 120 MG 12 hr tablet         rosuvastatin (CRESTOR) 20 MG tablet         sildenafil (REVATIO) 20 MG tablet         terbinafine (LAMISIL AT) 1 % cream         terbinafine (LAMISIL) 250 MG tablet         tolterodine (DETROL) 2 MG tablet         TRUEPLUS LANCETS 33G MISC         VOLTAREN 1 % GEL      Patient self  administered medications include: N/A.  SLEEP ARCHITECTURE Patient was studied for 375 minutes. The sleep efficiency was 96.3 % and the patient was supine for 6.7%. The arousal index was 0.0 per hour.  RESPIRATORY PARAMETERS The overall AHI was 69.6 per hour, with a central apnea index of 0.0 per hour.  The oxygen nadir was 77% during sleep.  CARDIAC DATA Mean heart rate during sleep was 72.2 bpm.  IMPRESSIONS - Severe obstructive sleep apnea occurred during this study (AHI 69.6/h); events were significantly worse with supine posture (AHI 142.9/h) - No significant central sleep apnea occurred during this study (CAI = 0.0/h). - Severe oxygen desaturation to a nadir of 77%; time spent with O2 < 88% was 68.3 minutes. - Patient snored 20.4% during the sleep.  DIAGNOSIS - Obstructive Sleep Apnea (327.23 [G47.33 ICD-10]) -  Nocturnal Hypoxemia (327.26 [G47.36 ICD-10])  RECOMMENDATIONS - Recommend expeditious in-lab CPAP/BiAP tiration study. - Efforts should be made to optimize nasal and oropharyngeal patency.  - Positional therapy avoiding supine position during sleep. - Avoid alcohol, sedatives and other CNS depressants that may worsen sleep apnea and disrupt normal sleep architecture. - Sleep hygiene should be reviewed to assess factors that may improve sleep quality. - Weight management and regular exercise should be initiated or continued. - Recommend a download be obtained after initiation of therapy and sleep clinic evaluuation.  [Electronically signed] 12/17/2017 01:40 PM  Shelva Majestic MD, Tomasa Hose Diplomate, American Board of Sleep Medicine   NPI: [id number] [place] PH: [phone]   FX: [phone] [place]

## 2017-12-18 ENCOUNTER — Other Ambulatory Visit: Payer: Self-pay | Admitting: Cardiovascular Disease

## 2017-12-18 DIAGNOSIS — I10 Essential (primary) hypertension: Secondary | ICD-10-CM

## 2017-12-18 DIAGNOSIS — G4733 Obstructive sleep apnea (adult) (pediatric): Secondary | ICD-10-CM

## 2017-12-18 MED ORDER — AMOXICILLIN-POT CLAVULANATE 875-125 MG PO TABS: 1.0000 | ORAL_TABLET | Freq: Two times a day (BID) | ORAL | 0 refills | Status: DC

## 2017-12-20 ENCOUNTER — Telehealth: Payer: Self-pay | Admitting: *Deleted

## 2017-12-20 NOTE — Progress Notes (Signed)
Patient notified of sleep study results and recommendations. 

## 2017-12-20 NOTE — Telephone Encounter (Signed)
Patient notified of HST results and recommendations. Titration appointment scheduled for 12/31/17.

## 2017-12-20 NOTE — Telephone Encounter (Signed)
-----   Message from Troy Sine, MD sent at 12/17/2017  1:45 PM EDT ----- Mariann Laster, please notify patient and set up for in lab expeditious CPAP/BiPAP titration

## 2017-12-21 ENCOUNTER — Other Ambulatory Visit: Payer: Self-pay | Admitting: Nurse Practitioner

## 2017-12-21 DIAGNOSIS — I10 Essential (primary) hypertension: Secondary | ICD-10-CM

## 2017-12-31 ENCOUNTER — Ambulatory Visit (HOSPITAL_BASED_OUTPATIENT_CLINIC_OR_DEPARTMENT_OTHER): Payer: Medicare HMO | Attending: Cardiovascular Disease

## 2018-01-03 DIAGNOSIS — M5440 Lumbago with sciatica, unspecified side: Secondary | ICD-10-CM | POA: Diagnosis not present

## 2018-01-12 ENCOUNTER — Encounter: Payer: Self-pay | Admitting: Nurse Practitioner

## 2018-01-12 DIAGNOSIS — R351 Nocturia: Secondary | ICD-10-CM

## 2018-01-12 MED ORDER — TOLTERODINE TARTRATE 2 MG PO TABS: 2.0000 mg | ORAL_TABLET | Freq: Two times a day (BID) | ORAL | 0 refills | Status: DC

## 2018-01-16 ENCOUNTER — Encounter: Payer: Self-pay | Admitting: Sports Medicine

## 2018-01-16 ENCOUNTER — Ambulatory Visit (INDEPENDENT_AMBULATORY_CARE_PROVIDER_SITE_OTHER): Payer: Medicare HMO | Admitting: Sports Medicine

## 2018-01-16 DIAGNOSIS — M79671 Pain in right foot: Secondary | ICD-10-CM | POA: Diagnosis not present

## 2018-01-16 DIAGNOSIS — M79672 Pain in left foot: Secondary | ICD-10-CM | POA: Diagnosis not present

## 2018-01-16 DIAGNOSIS — E119 Type 2 diabetes mellitus without complications: Secondary | ICD-10-CM

## 2018-01-16 DIAGNOSIS — B351 Tinea unguium: Secondary | ICD-10-CM | POA: Diagnosis not present

## 2018-01-16 NOTE — Progress Notes (Signed)
Subjective: Harold [name] is a 52 y.o. male patient with history of diabetes who returns to office for diabetic nail care; states that his sugars are controlled, not checked today but the other day states that it was around 89 with last A1c 6.9. Patient denies any new changes in medication or new problems.  No other issues noted.  Patient Active Problem List   Diagnosis Date Noted  . Sinus congestion 12/19/2016  . Thrush 08/02/2016  . Sleep disturbance 07/04/2016  . 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  . Urinary frequency 07/24/2015  . Trichomonas exposure 07/24/2015  . Type 2 diabetes mellitus ([place]) 03/04/2015  . Verruca vulgaris 02/24/2015  . Nocturia 02/17/2015  . Accessory skin tags 02/17/2015  . Sinusitis, acute 06/04/2014  . Lumbar radiculopathy, acute 05/23/2014  . Acute left-sided back pain with sciatica 04/10/2014  . Smoker 04/10/2014  . Cough 01/01/2014  . Grief reaction 11/19/2013  . Primary localized osteoarthrosis, lower leg 09/04/2013  . Chronic pain syndrome 07/11/2013  . Hyperlipidemia 07/11/2013  . 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 Gonalgia 11/29/2012  . Preventative health care 09/14/2012  . DDD (degenerative disc disease), lumbar 09/14/2012  . Left sided sciatica 09/14/2012  . Erectile dysfunction 09/14/2012  . Pre-ulcerative corn or callous 09/14/2012  . Hemorrhoids 07/26/2011  . Chronic back pain greater than 3 months duration 09/23/2010  . Abnormal EKG 08/12/2010  . OSA (obstructive sleep apnea) 08/09/2010  . 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 CHF (congestive heart failure) ([place]) 06/29/2010  . Edema 06/10/2010  . Acute idiopathic pericarditis 04/08/2010  . NONSPECIFIC ABNORMAL ELECTROCARDIOGRAM 04/08/2010  . HYPERTRIGLYCERIDEMIA 05/26/2009  . SHOULDER PAIN, LEFT 04/21/2008  . Depression 03/11/2008  . Essential hypertension 03/11/2008  . Gout, unspecified 05/01/2007  . METABOLIC SYNDROME X 46/50/3546  . OBESITY NOS 10/24/2006   Current  Outpatient Medications on File Prior to Visit  Medication Sig Dispense Refill  . amLODipine (NORVASC) 10 MG tablet Take 1 tablet (10 mg total) by mouth daily. Annual appt due in Oct must see provider for future refills 90 tablet 0  . ammonium lactate (AMLACTIN) 12 % lotion Apply 1 application topically as needed for dry skin. 400 g 3  . amoxicillin-clavulanate (AUGMENTIN) 875-125 MG tablet Take 1 tablet by mouth 2 (two) times daily. 20 tablet 0  . aspirin EC 81 MG tablet Take 81 mg by mouth daily.    . benzonatate (TESSALON) 100 MG capsule Take 1 capsule (100 mg total) by mouth 2 (two) times daily as needed for cough. 20 capsule 0  . Blood Glucose Monitoring Suppl (ACCU-CHEK NANO SMARTVIEW) W/DEVICE KIT Use glucose meter to check blood sugar 1-3 times daily as directed. 1 kit 0  . cetirizine (ZYRTEC) 5 MG tablet Take 5 mg by mouth daily as needed for allergies.     . citalopram (CELEXA) 40 MG tablet Take 1 tablet (40 mg total) by mouth daily. Annual appt due in Oct must see provider for future refills 90 tablet 0  . fenofibrate (TRICOR) 145 MG tablet TAKE ONE TABLET BY MOUTH DAILY. 90 tablet 0  . fentaNYL (DURAGESIC - DOSED MCG/HR) 50 MCG/HR Place 50 mcg onto the skin every 3 (three) days.    . flunisolide (NASALIDE) 25 MCG/ACT (0.025%) SOLN Place 2 sprays into the nose 2 (two) times daily. 1 Bottle 3  . furosemide (LASIX) 40 MG tablet TAKE ONE TABLET BY MOUTH DAILY 90 tablet 0  . gabapentin (NEURONTIN) 300 MG capsule Take 900 mg by mouth 3 (three) times daily.    [name]  glucose blood (ACCU-CHEK SMARTVIEW) test strip Use as instructed 100 each 12  . ibuprofen (ADVIL,MOTRIN) 800 MG tablet Take 800 mg by mouth every 8 (eight) hours as needed.    [name] lisinopril (PRINIVIL,ZESTRIL) 20 MG tablet Take 1 tablet (20 mg total) by mouth 2 (two) times daily. 180 tablet 1  . metFORMIN (GLUCOPHAGE) 500 MG tablet TAKE ONE TABLET BY MOUTH TWICE A DAY WITH A MEAL 60 tablet 0  . OVER THE COUNTER MEDICATION Take 500 mg by  mouth daily.    [name] oxyCODONE-acetaminophen (PERCOCET) 10-325 MG tablet Take 1 tablet by mouth every 6 (six) hours as needed for pain. 90 tablet 0  . Potassium Chloride ER 20 MEQ TBCR Take 1 tablet by mouth daily. 90 tablet 0  . pseudoephedrine (SUDAFED) 120 MG 12 hr tablet Take 1 tablet (120 mg total) by mouth 2 (two) times daily as needed for congestion. 20 tablet 1  . rosuvastatin (CRESTOR) 20 MG tablet Take 1 tablet (20 mg total) by mouth daily. Annual appt due in Oct must see provider for future refills 90 tablet 0  . sildenafil (REVATIO) 20 MG tablet Take 1-5 tablets (20-100 mg total) by mouth daily as needed. 50 tablet 2  . terbinafine (LAMISIL AT) 1 % cream Apply 1 application topically daily as needed (foot fungus). 42 g 0  . terbinafine (LAMISIL) 250 MG tablet TAKE ONE TABLET BY MOUTH DAILY 90 tablet 0  . tolterodine (DETROL) 2 MG tablet Take 1 tablet (2 mg total) by mouth 2 (two) times daily. Overdue for follow-up appt must see provider for future refills 60 tablet 0  . TRUEPLUS LANCETS 33G MISC Use to check blood sugars twice a day 300 each 1  . VOLTAREN 1 % GEL Apply 2 g topically 4 (four) times daily. 100 g 5   No current facility-administered medications on file prior to visit.    Allergies  Allergen Reactions  . Other Itching, Swelling and Palpitations    Pecans: itching and swelling of the tongue   . Diphenhydramine Itching, Palpitations and Other (See Comments)    "jittery"  . Colchicine Diarrhea  . Lipitor [Atorvastatin] Other (See Comments)    Muscle cramps  . Septra [Sulfamethoxazole-Trimethoprim] Itching    No results found for this or any previous visit (from the past 2160 hour(s)).  Objective: General: Patient is awake, alert, and oriented x 3 and in no acute distress.  Integument: Skin is warm, dry and supple bilateral. Nails are tender, long, thickened and dystrophic with subungual debris, consistent with onychomycosis, 1-5 bilateral. No acute signs of  infection. No open lesions or preulcerative lesions present bilateral. Remaining integument unremarkable.  Vasculature:  Dorsalis Pedis pulse 2/4 bilateral. Posterior Tibial pulse  2/4 bilateral. Capillary fill time <3 sec 1-5 bilateral. Positive hair growth to the level of the digits.Temperature gradient within normal limits. No varicosities present bilateral. No edema present bilateral.   Neurology: The patient has intact sensation measured with a 5.07/10g Semmes Weinstein Monofilament at all pedal sites bilateral . Vibratory sensation intact bilateral with tuning fork. No Babinski sign present bilateral.   Musculoskeletal: No symptomatic pedal deformities noted bilateral. Muscular strength 5/5 in all lower extremity muscular groups bilateral without pain on range of motion. No tenderness with calf compression bilateral.  Assessment and Plan: Problem List Items Addressed This Visit    None    Visit Diagnoses    Diabetes mellitus without complication ([place])    -  Primary   Dermatophytosis of nail  Foot pain, bilateral         -Examined patient. -Discussed and educated patient on diabetic foot care, especially with regards to the vascular, neurological and musculoskeletal systems.  -Mechanically debrided all nails 1-5 bilateral using sterile nail nipper and filed with dremel without incident  -Continue with daily skin emollients -Answered all patient questions -Patient to return in 3 months for at risk foot care -Patient advised to call the office if any problems or questions arise in the meantime.  Landis Martins, DPM

## 2018-01-19 ENCOUNTER — Other Ambulatory Visit: Payer: Self-pay | Admitting: Internal Medicine

## 2018-01-19 ENCOUNTER — Encounter: Payer: Self-pay | Admitting: Internal Medicine

## 2018-01-22 DIAGNOSIS — M5416 Radiculopathy, lumbar region: Secondary | ICD-10-CM | POA: Diagnosis not present

## 2018-01-26 ENCOUNTER — Encounter: Payer: Self-pay | Admitting: Nurse Practitioner

## 2018-01-26 ENCOUNTER — Ambulatory Visit (INDEPENDENT_AMBULATORY_CARE_PROVIDER_SITE_OTHER): Payer: Self-pay | Admitting: Nurse Practitioner

## 2018-01-26 VITALS — BP 130/90 | HR 68 | Ht 71.0 in | Wt 254.0 lb

## 2018-01-26 DIAGNOSIS — W19XXXA Unspecified fall, initial encounter: Secondary | ICD-10-CM

## 2018-01-26 DIAGNOSIS — M25562 Pain in left knee: Secondary | ICD-10-CM

## 2018-01-26 DIAGNOSIS — Z23 Encounter for immunization: Secondary | ICD-10-CM

## 2018-01-26 DIAGNOSIS — J0140 Acute pansinusitis, unspecified: Secondary | ICD-10-CM

## 2018-01-26 DIAGNOSIS — M25522 Pain in left elbow: Secondary | ICD-10-CM

## 2018-01-26 MED ORDER — PREDNISONE 20 MG PO TABS: 20.0000 mg | ORAL_TABLET | Freq: Every day | ORAL | 0 refills | Status: DC

## 2018-01-26 MED ORDER — DOXYCYCLINE HYCLATE 100 MG PO TABS: 100.0000 mg | ORAL_TABLET | Freq: Two times a day (BID) | ORAL | 0 refills | Status: DC

## 2018-01-26 NOTE — Patient Instructions (Signed)
Xrays downstairs today.  Complete doxycyline and prednisone for sinus infection  Please schedule Follow up with Dr Jenny Reichmann for routine care.

## 2018-01-26 NOTE — Progress Notes (Signed)
Harold [name] is a 52 y.o. male with the following history as recorded in Epic[employer]:  Patient Active Problem List   Diagnosis Date Noted  . Sinus congestion 12/19/2016  . Thrush 08/02/2016  . Sleep disturbance 07/04/2016  . 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  . Urinary frequency 07/24/2015  . Trichomonas exposure 07/24/2015  . Type 2 diabetes mellitus ([place]) 03/04/2015  . Verruca vulgaris 02/24/2015  . Nocturia 02/17/2015  . Accessory skin tags 02/17/2015  . Sinusitis, acute 06/04/2014  . Lumbar radiculopathy, acute 05/23/2014  . Acute left-sided back pain with sciatica 04/10/2014  . Smoker 04/10/2014  . Cough 01/01/2014  . Grief reaction 11/19/2013  . Primary localized osteoarthrosis, lower leg 09/04/2013  . Chronic pain syndrome 07/11/2013  . Hyperlipidemia 07/11/2013  . 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 Gonalgia 11/29/2012  . Preventative health care 09/14/2012  . DDD (degenerative disc disease), lumbar 09/14/2012  . Left sided sciatica 09/14/2012  . Erectile dysfunction 09/14/2012  . Pre-ulcerative corn or callous 09/14/2012  . Hemorrhoids 07/26/2011  . Chronic back pain greater than 3 months duration 09/23/2010  . Abnormal EKG 08/12/2010  . OSA (obstructive sleep apnea) 08/09/2010  . 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 CHF (congestive heart failure) ([place]) 06/29/2010  . Edema 06/10/2010  . Acute idiopathic pericarditis 04/08/2010  . NONSPECIFIC ABNORMAL ELECTROCARDIOGRAM 04/08/2010  . HYPERTRIGLYCERIDEMIA 05/26/2009  . SHOULDER PAIN, LEFT 04/21/2008  . Depression 03/11/2008  . Essential hypertension 03/11/2008  . Gout, unspecified 05/01/2007  . METABOLIC SYNDROME X 81/19/1478  . OBESITY NOS 10/24/2006    Current Outpatient Medications  Medication Sig Dispense Refill  . amLODipine (NORVASC) 10 MG tablet Take 1 tablet (10 mg total) by mouth daily. Annual appt due in Oct must see provider for future refills 90 tablet 0  . ammonium lactate (AMLACTIN) 12 %  lotion Apply 1 application topically as needed for dry skin. 400 g 3  . amoxicillin-clavulanate (AUGMENTIN) 875-125 MG tablet Take 1 tablet by mouth 2 (two) times daily. 20 tablet 0  . aspirin EC 81 MG tablet Take 81 mg by mouth daily.    . benzonatate (TESSALON) 100 MG capsule Take 1 capsule (100 mg total) by mouth 2 (two) times daily as needed for cough. 20 capsule 0  . Blood Glucose Monitoring Suppl (ACCU-CHEK NANO SMARTVIEW) W/DEVICE KIT Use glucose meter to check blood sugar 1-3 times daily as directed. 1 kit 0  . cetirizine (ZYRTEC) 5 MG tablet Take 5 mg by mouth daily as needed for allergies.     . citalopram (CELEXA) 40 MG tablet Take 1 tablet (40 mg total) by mouth daily. Annual appt due in Oct must see provider for future refills 90 tablet 0  . fenofibrate (TRICOR) 145 MG tablet TAKE ONE TABLET BY MOUTH DAILY. 90 tablet 0  . fentaNYL (DURAGESIC - DOSED MCG/HR) 50 MCG/HR Place 50 mcg onto the skin every 3 (three) days.    . flunisolide (NASALIDE) 25 MCG/ACT (0.025%) SOLN Place 2 sprays into the nose 2 (two) times daily. 1 Bottle 3  . furosemide (LASIX) 40 MG tablet TAKE ONE TABLET BY MOUTH DAILY 90 tablet 0  . gabapentin (NEURONTIN) 300 MG capsule Take 900 mg by mouth 3 (three) times daily.    [name] glucose blood (ACCU-CHEK SMARTVIEW) test strip Use as instructed 100 each 12  . ibuprofen (ADVIL,MOTRIN) 800 MG tablet Take 800 mg by mouth every 8 (eight) hours as needed.    [name] lisinopril (PRINIVIL,ZESTRIL) 20 MG tablet Take 1 tablet (20 mg total) by  mouth 2 (two) times daily. 180 tablet 1  . metFORMIN (GLUCOPHAGE) 500 MG tablet TAKE ONE TABLET BY MOUTH TWICE A DAY WITH A MEAL 60 tablet 0  . OVER THE COUNTER MEDICATION Take 500 mg by mouth daily.    [name] oxyCODONE-acetaminophen (PERCOCET) 10-325 MG tablet Take 1 tablet by mouth every 6 (six) hours as needed for pain. 90 tablet 0  . Potassium Chloride ER 20 MEQ TBCR Take 1 tablet by mouth daily. 90 tablet 0  . pseudoephedrine (SUDAFED) 120 MG 12 hr  tablet Take 1 tablet (120 mg total) by mouth 2 (two) times daily as needed for congestion. 20 tablet 1  . rosuvastatin (CRESTOR) 20 MG tablet Take 1 tablet (20 mg total) by mouth daily. Annual appt due in Oct must see provider for future refills 90 tablet 0  . sildenafil (REVATIO) 20 MG tablet Take 1-5 tablets (20-100 mg total) by mouth daily as needed. 50 tablet 2  . terbinafine (LAMISIL AT) 1 % cream Apply 1 application topically daily as needed (foot fungus). 42 g 0  . terbinafine (LAMISIL) 250 MG tablet TAKE ONE TABLET BY MOUTH DAILY 90 tablet 0  . tolterodine (DETROL) 2 MG tablet Take 1 tablet (2 mg total) by mouth 2 (two) times daily. Overdue for follow-up appt must see provider for future refills 60 tablet 0  . TRUEPLUS LANCETS 33G MISC Use to check blood sugars twice a day 300 each 1  . VOLTAREN 1 % GEL Apply 2 g topically 4 (four) times daily. 100 g 5  . doxycycline (VIBRA-TABS) 100 MG tablet Take 1 tablet (100 mg total) by mouth 2 (two) times daily. 14 tablet 0  . predniSONE (DELTASONE) 20 MG tablet Take 1 tablet (20 mg total) by mouth daily with breakfast. 5 tablet 0   No current facility-administered medications for this visit.     Allergies: Other; Diphenhydramine; Colchicine; Lipitor [atorvastatin]; and Septra [sulfamethoxazole-trimethoprim]  Past Medical History:  Diagnosis Date  . Acute pericarditis    admx 03-26-10 thru 03-29-10; a. echo 03-26-10: EF 55-60%; mild LVH; trivial Harold; RVF ok; mild -mod circumferential Eff w/o tamponade  . Allergic rhinitis, cause unspecified 09/14/2012  . Allergy   . Anxiety   . Chronic back pain   . DDD (degenerative disc disease), lumbar 09/14/2012  . Depression   . Diabetes mellitus without complication ([facility])    borderline   . Erectile dysfunction 09/14/2012  . GERD (gastroesophageal reflux disease)   . Gout    pt denies  . Headache    migraines in the past  . History of pancreatitis    a. admx 04-2009.Marland KitchenMarland Kitchen? 2-2 triglycerides  .  Hyperlipidemia 07/11/2013  . Hypertension   . Hypertriglyceridemia    a. followed by [facility]  . Left sided sciatica 09/14/2012  . Metabolic syndrome   . Obesity   . OSA (obstructive sleep apnea)    CPAP  . Restless legs     Past Surgical History:  Procedure Laterality Date  . COLONOSCOPY    . KNEE SURGERY     right  . LUMBAR LAMINECTOMY/DECOMPRESSION MICRODISCECTOMY N/A 08/26/2015   Procedure: Lumbar Two-Sacral One  Laminectomy for decompression;  Surgeon: [name], MD;  Location: [facility];  Service: Neurosurgery;  Laterality: N/A;  . pericardectomy  07/15/2010   Dr. [name]  . pleurx catheter placement  07/15/2010   Dr. [name]  . SHOULDER SURGERY     right and left shoulders  . TONSILLECTOMY AND ADENOIDECTOMY  one tonsil    Family History  Problem Relation Age of Onset  . Diabetes Mother   . Diabetes Father   . Colon cancer Father 91  . Heart disease Father 45       CABG  . Pancreatitis Father   . Hypertension Other        entire family  . Liver cancer Paternal Uncle   . Diabetes Sister   . Stomach cancer Maternal Grandfather   . Pancreatitis Paternal Uncle        x 2  . Esophageal cancer Neg Hx   . Rectal cancer Neg Hx     Social History   Tobacco Use  . Smoking status: Current Every Day Smoker    Packs/day: 0.50    Years: 20.00    Pack years: 10.00    Types: Cigarettes  . Smokeless tobacco: Never Used  Substance Use Topics  . Alcohol use: Yes    Alcohol/week: 2.0 standard drinks    Types: 2 Shots of liquor per week    Comment: occasional      Subjective:  Harold [name] is here Requesting discussion of multiple complaints today including sinus pain and pressure and a fall.  He reports history of recurrent sinus infections, was seeing ENT in the past for this but stopped following up. He was last treated for sinus infection about 1 month ago with course of augmentin, reports symptoms resolved but then returned again about week or  more ago: fevers, chills, nasal Congestion, copious nasal drainage- yellow, ear pressure, sinus headaches and facial pain and pressure. He is asking for antibiotic and steroid course today, says he would prefer doxycyline as he feels it works better.  He reports a fall yesterday on porch steps, tripped on steps and fell onto left arm and leg, has had left elbow and left knee pain since. Denies head injury, LOC.  ROS- See HPI   Objective:  Vitals:   01/26/18 1509  BP: 130/90  Pulse: 68  SpO2: 97%  Weight: 254 lb (115.2 kg)  Height: _0  (1.803 m)    General: Well developed, well nourished, in no acute distress  Skin : Warm and dry. No erythema, ecchymosis. Head: Normocephalic and atraumatic  Eyes: Sclera and conjunctiva clear; pupils round and reactive to light; extraocular movements intact  Ears: External normal; canals clear; tympanic membranes normal  Nose: maxillary and frontal sinus tenderness. Oropharynx: Pink, supple. No suspicious lesions  Neck: Supple without adenopathy  Lungs: Respirations unlabored; clear to auscultation bilaterally  CVS exam: normal rate and regular rhythm, S1 and S2 normal.  Musculoskeletal: swelling and tenderness to left elbow, tenderness to left knee Extremities: No edema, cyanosis  Vessels: Symmetric bilaterally  Neurologic: Alert and oriented; speech intact; face symmetrical; moves all extremities well; CNII-XII intact without focal deficit   Assessment:  1. Acute pansinusitis, recurrence not specified   2. Fall, initial encounter   3. Need for influenza vaccination     Plan:   1. Fall, initial encounter Imaging ordered F/U with further recommendations pending  results - DG Knee Complete 4 Views Left; Future - DG Elbow Complete Left; Future  2. Acute pansinusitis, recurrence not specified Doxycyline course-dosing and side effects discussed Will give short course of prednisone at his request-dosing and side effects discussed We  discussed referral to ENT for further evaluation of recurrent sinus infections, he is agreeable - doxycycline (VIBRA-TABS) 100 MG tablet; Take 1 tablet (100 mg total) by mouth 2 (two) times daily.  Dispense: 14 tablet; Refill: 0 - Ambulatory referral to ENT - predniSONE (DELTASONE) 20 MG tablet; Take 1 tablet (20 mg total) by mouth daily with breakfast.  Dispense: 5 tablet; Refill: 0  3. Need for influenza vaccination- Flu Vaccine QUAD 36+ mos IM  No follow-ups on file.  Orders Placed This Encounter  Procedures  . DG Knee Complete 4 Views Left    Standing Status:   Future    Standing Expiration Date:   03/29/2019    Order Specific Question:   Reason for Exam (SYMPTOM  OR DIAGNOSIS REQUIRED)    Answer:   fall, pain    Order Specific Question:   Preferred imaging location?    Answer:   [facility]    Order Specific Question:   Radiology Contrast Protocol - do NOT remove file path    Answer:   \\[email or web address]  . DG Elbow Complete Left    Standing Status:   Future    Standing Expiration Date:   03/29/2019    Order Specific Question:   Reason for Exam (SYMPTOM  OR DIAGNOSIS REQUIRED)    Answer:   fall, pain    Order Specific Question:   Preferred imaging location?    Answer:   [facility]    Order Specific Question:   Radiology Contrast Protocol - do NOT remove file path    Answer:   \\[email or web address].[email or web address]  . Flu Vaccine QUAD 36+ mos IM  . Ambulatory referral to ENT    Referral Priority:   Routine    Referral Type:   Consultation    Referral Reason:   Specialty Services Required    Requested Specialty:   Otolaryngology    Number of Visits Requested:   1    Requested Prescriptions   Signed Prescriptions Disp Refills  . doxycycline (VIBRA-TABS) 100 MG tablet 14 tablet 0    Sig: Take 1 tablet (100 mg total) by mouth 2 (two) times daily.  . predniSONE (DELTASONE) 20 MG tablet 5 tablet 0    Sig: Take  1 tablet (20 mg total) by mouth daily with breakfast.

## 2018-02-13 ENCOUNTER — Encounter: Payer: Self-pay | Admitting: Internal Medicine

## 2018-02-13 ENCOUNTER — Other Ambulatory Visit (INDEPENDENT_AMBULATORY_CARE_PROVIDER_SITE_OTHER): Payer: Medicare Other

## 2018-02-13 ENCOUNTER — Ambulatory Visit (INDEPENDENT_AMBULATORY_CARE_PROVIDER_SITE_OTHER): Payer: Medicare Other | Admitting: Internal Medicine

## 2018-02-13 VITALS — BP 146/92 | HR 72 | Temp 98.2°F | Ht 71.0 in | Wt 261.0 lb

## 2018-02-13 DIAGNOSIS — J309 Allergic rhinitis, unspecified: Secondary | ICD-10-CM | POA: Insufficient documentation

## 2018-02-13 DIAGNOSIS — E119 Type 2 diabetes mellitus without complications: Secondary | ICD-10-CM

## 2018-02-13 DIAGNOSIS — Z1211 Encounter for screening for malignant neoplasm of colon: Secondary | ICD-10-CM

## 2018-02-13 DIAGNOSIS — N320 Bladder-neck obstruction: Secondary | ICD-10-CM

## 2018-02-13 DIAGNOSIS — I10 Essential (primary) hypertension: Secondary | ICD-10-CM | POA: Diagnosis not present

## 2018-02-13 DIAGNOSIS — J0140 Acute pansinusitis, unspecified: Secondary | ICD-10-CM | POA: Diagnosis not present

## 2018-02-13 LAB — PSA: PSA: 1.29 ng/mL (ref 0.10–4.00)

## 2018-02-13 LAB — BASIC METABOLIC PANEL
BUN: 11 mg/dL (ref 6–23)
CO2: 26 mEq/L (ref 19–32)
Calcium: 9.7 mg/dL (ref 8.4–10.5)
Chloride: 99 mEq/L (ref 96–112)
Creatinine, Ser: 0.96 mg/dL (ref 0.40–1.50)
GFR: 106.39 mL/min (ref >60.00)
Glucose, Bld: 159 mg/dL — ABNORMAL HIGH (ref 70–99)
Potassium: 4.2 mEq/L (ref 3.5–5.1)
Sodium: 135 mEq/L (ref 135–145)

## 2018-02-13 LAB — HEPATIC FUNCTION PANEL
ALT: 18 U/L (ref 0–53)
AST: 17 U/L (ref 0–37)
Albumin: 4.3 g/dL (ref 3.5–5.2)
Alkaline Phosphatase: 93 U/L (ref 39–117)
Bilirubin, Direct: 0.1 mg/dL (ref 0.0–0.3)
Total Bilirubin: 0.4 mg/dL (ref 0.2–1.2)
Total Protein: 7.1 g/dL (ref 6.0–8.3)

## 2018-02-13 LAB — CBC WITH DIFFERENTIAL/PLATELET
Basophils Absolute: 0.1 10*3/uL (ref 0.0–0.1)
Basophils Relative: 0.7 % (ref 0.0–3.0)
Eosinophils Absolute: 0.1 10*3/uL (ref 0.0–0.7)
Eosinophils Relative: 0.8 % (ref 0.0–5.0)
HCT: 45.3 % (ref 39.0–52.0)
Hemoglobin: 15.3 g/dL (ref 13.0–17.0)
Lymphocytes Relative: 8.6 % — ABNORMAL LOW (ref 12.0–46.0)
Lymphs Abs: 1.1 10*3/uL (ref 0.7–4.0)
MCHC: 33.8 g/dL (ref 30.0–36.0)
MCV: 96.2 fl (ref 78.0–100.0)
Monocytes Absolute: 0.6 10*3/uL (ref 0.1–1.0)
Monocytes Relative: 4.2 % (ref 3.0–12.0)
Neutro Abs: 11.3 10*3/uL — ABNORMAL HIGH (ref 1.4–7.7)
Neutrophils Relative %: 85.7 % — ABNORMAL HIGH (ref 43.0–77.0)
Platelets: 351 10*3/uL (ref 150.0–400.0)
RBC: 4.71 Mil/uL (ref 4.22–5.81)
RDW: 12.8 % (ref 11.5–15.5)
WBC: 13.2 10*3/uL — ABNORMAL HIGH (ref 4.0–10.5)

## 2018-02-13 LAB — HEMOGLOBIN A1C: Hgb A1c MFr Bld: 7.3 % — ABNORMAL HIGH (ref 4.6–6.5)

## 2018-02-13 LAB — LIPID PANEL
Cholesterol: 135 mg/dL (ref 0–200)
HDL: 29.3 mg/dL — ABNORMAL LOW (ref >39.00)
Total CHOL/HDL Ratio: 5
Triglycerides: 552 mg/dL — ABNORMAL HIGH (ref 0.0–149.0)

## 2018-02-13 LAB — TSH: TSH: 0.45 u[IU]/mL (ref 0.35–4.50)

## 2018-02-13 LAB — MICROALBUMIN / CREATININE URINE RATIO
Creatinine,U: 88 mg/dL
Microalb Creat Ratio: 0.9 mg/g (ref 0.0–30.0)
Microalb, Ur: 0.8 mg/dL (ref 0.0–1.9)

## 2018-02-13 LAB — LDL CHOLESTEROL, DIRECT: Direct LDL: 53 mg/dL

## 2018-02-13 MED ORDER — AMOXICILLIN-POT CLAVULANATE 875-125 MG PO TABS: 1.0000 | ORAL_TABLET | Freq: Two times a day (BID) | ORAL | 0 refills | Status: DC

## 2018-02-13 MED ORDER — POTASSIUM CHLORIDE ER 20 MEQ PO TBCR: 1.0000 | EXTENDED_RELEASE_TABLET | Freq: Every day | ORAL | 3 refills | Status: DC

## 2018-02-13 MED ORDER — PREDNISONE 20 MG PO TABS: 20.0000 mg | ORAL_TABLET | Freq: Every day | ORAL | 0 refills | Status: DC

## 2018-02-13 MED ORDER — AMLODIPINE BESYLATE 10 MG PO TABS
10.0000 mg | ORAL_TABLET | Freq: Every day | ORAL | 3 refills | Status: DC
Start: 2018-02-13 — End: 2019-04-02

## 2018-02-13 MED ORDER — METHYLPREDNISOLONE ACETATE 80 MG/ML IJ SUSP
80.0000 mg | Freq: Once | INTRAMUSCULAR | Status: AC
  Administered 2018-02-13: 80 mg via INTRAMUSCULAR

## 2018-02-13 NOTE — Assessment & Plan Note (Signed)
Mild to mod, for antibx course,  to f/u any worsening symptoms or concerns 

## 2018-02-13 NOTE — Progress Notes (Signed)
Subjective:    Patient ID: Harold [name], male    DOB: 1965/04/04, 52 y.o.   MRN: [id number]  HPI   Here with 2-3 days acute onset recurrence in fever, facial pain, pressure, headache, general weakness and malaise, and greenish d/c, with mild ST and cough, but pt denies chest pain, wheezing, increased sob or doe, orthopnea, PND, increased LE swelling, palpitations, dizziness or syncope. Also has a painful left upper tooth as well.  Does have several wks ongoing nasal allergy symptoms with clearish congestion, itch and sneezing, without fever, pain, ST, cough, swelling or wheezing. Pt denies new neurological symptoms such as new headache, or facial or extremity weakness or numbness   Pt denies polydipsia, polyuria Past Medical History:  Diagnosis Date  . Acute pericarditis    admx 03-26-10 thru 03-29-10; a. echo 03-26-10: EF 55-60%; mild LVH; trivial MR; RVF ok; mild -mod circumferential Eff w/o tamponade  . Allergic rhinitis, cause unspecified 09/14/2012  . Allergy   . Anxiety   . Chronic back pain   . DDD (degenerative disc disease), lumbar 09/14/2012  . Depression   . Diabetes mellitus without complication (HCC)    borderline   . Erectile dysfunction 09/14/2012  . GERD (gastroesophageal reflux disease)   . Gout    pt denies  . Headache    migraines in the past  . History of pancreatitis    a. admx 04-2009.Marland KitchenMarland Kitchen? 2-2 triglycerides  . Hyperlipidemia 07/11/2013  . Hypertension   . Hypertriglyceridemia    a. followed by [facility]  . Left sided sciatica 09/14/2012  . Metabolic syndrome   . Obesity   . OSA (obstructive sleep apnea)    CPAP  . Restless legs    Past Surgical History:  Procedure Laterality Date  . COLONOSCOPY    . KNEE SURGERY     right  . LUMBAR LAMINECTOMY/DECOMPRESSION MICRODISCECTOMY N/A 08/26/2015   Procedure: Lumbar Two-Sacral One  Laminectomy for decompression;  Surgeon: [name], MD;  Location: [facility];  Service: Neurosurgery;  Laterality:  N/A;  . pericardectomy  07/15/2010   Dr. [name]  . pleurx catheter placement  07/15/2010   Dr. [name]  . SHOULDER SURGERY     right and left shoulders  . TONSILLECTOMY AND ADENOIDECTOMY     one tonsil    reports that he has been smoking cigarettes. He has a 10.00 pack-year smoking history. He has never used smokeless tobacco. He reports that he drinks about 2.0 standard drinks of alcohol per week. He reports that he does not use drugs. family history includes Colon cancer (age of onset: 62) in his father; Diabetes in his father, mother, and sister; Heart disease (age of onset: 40) in his father; Hypertension in his other; Liver cancer in his paternal uncle; Pancreatitis in his father and paternal uncle; Stomach cancer in his maternal grandfather. Allergies  Allergen Reactions  . Other Itching, Swelling and Palpitations    Pecans: itching and swelling of the tongue   . Diphenhydramine Itching, Palpitations and Other (See Comments)    "jittery"  . Colchicine Diarrhea  . Lipitor [Atorvastatin] Other (See Comments)    Muscle cramps  . Septra [Sulfamethoxazole-Trimethoprim] Itching   Current Outpatient Medications on File Prior to Visit  Medication Sig Dispense Refill  . ammonium lactate (AMLACTIN) 12 % lotion Apply 1 application topically as needed for dry skin. 400 g 3  . aspirin EC 81 MG tablet Take 81 mg by mouth daily.    [name]  benzonatate (TESSALON) 100 MG capsule Take 1 capsule (100 mg total) by mouth 2 (two) times daily as needed for cough. 20 capsule 0  . Blood Glucose Monitoring Suppl (ACCU-CHEK NANO SMARTVIEW) W/DEVICE KIT Use glucose meter to check blood sugar 1-3 times daily as directed. 1 kit 0  . cetirizine (ZYRTEC) 5 MG tablet Take 5 mg by mouth daily as needed for allergies.     . citalopram (CELEXA) 40 MG tablet Take 1 tablet (40 mg total) by mouth daily. Annual appt due in Oct must see provider for future refills 90 tablet 0  . doxycycline (VIBRA-TABS) 100 MG tablet Take 1  tablet (100 mg total) by mouth 2 (two) times daily. 14 tablet 0  . fenofibrate (TRICOR) 145 MG tablet TAKE ONE TABLET BY MOUTH DAILY. 90 tablet 0  . fentaNYL (DURAGESIC - DOSED MCG/HR) 50 MCG/HR Place 50 mcg onto the skin every 3 (three) days.    . flunisolide (NASALIDE) 25 MCG/ACT (0.025%) SOLN Place 2 sprays into the nose 2 (two) times daily. 1 Bottle 3  . furosemide (LASIX) 40 MG tablet TAKE ONE TABLET BY MOUTH DAILY 90 tablet 0  . gabapentin (NEURONTIN) 300 MG capsule Take 900 mg by mouth 3 (three) times daily.    [name] glucose blood (ACCU-CHEK SMARTVIEW) test strip Use as instructed 100 each 12  . ibuprofen (ADVIL,MOTRIN) 800 MG tablet Take 800 mg by mouth every 8 (eight) hours as needed.    [name] lisinopril (PRINIVIL,ZESTRIL) 20 MG tablet Take 1 tablet (20 mg total) by mouth 2 (two) times daily. 180 tablet 1  . metFORMIN (GLUCOPHAGE) 500 MG tablet TAKE ONE TABLET BY MOUTH TWICE A DAY WITH A MEAL 60 tablet 0  . OVER THE COUNTER MEDICATION Take 500 mg by mouth daily.    [name] oxyCODONE-acetaminophen (PERCOCET) 10-325 MG tablet Take 1 tablet by mouth every 6 (six) hours as needed for pain. 90 tablet 0  . pseudoephedrine (SUDAFED) 120 MG 12 hr tablet Take 1 tablet (120 mg total) by mouth 2 (two) times daily as needed for congestion. 20 tablet 1  . rosuvastatin (CRESTOR) 20 MG tablet Take 1 tablet (20 mg total) by mouth daily. Annual appt due in Oct must see provider for future refills 90 tablet 0  . sildenafil (REVATIO) 20 MG tablet Take 1-5 tablets (20-100 mg total) by mouth daily as needed. 50 tablet 2  . terbinafine (LAMISIL AT) 1 % cream Apply 1 application topically daily as needed (foot fungus). 42 g 0  . terbinafine (LAMISIL) 250 MG tablet TAKE ONE TABLET BY MOUTH DAILY 90 tablet 0  . tolterodine (DETROL) 2 MG tablet Take 1 tablet (2 mg total) by mouth 2 (two) times daily. Overdue for follow-up appt must see provider for future refills 60 tablet 0  . TRUEPLUS LANCETS 33G MISC Use to check blood sugars  twice a day 300 each 1  . VOLTAREN 1 % GEL Apply 2 g topically 4 (four) times daily. 100 g 5   No current facility-administered medications on file prior to visit.    Review of Systems  Constitutional: Negative for other unusual diaphoresis or sweats HENT: Negative for ear discharge or swelling Eyes: Negative for other worsening visual disturbances Respiratory: Negative for stridor or other swelling  Gastrointestinal: Negative for worsening distension or other blood Genitourinary: Negative for retention or other urinary change Musculoskeletal: Negative for other MSK pain or swelling Skin: Negative for color change or other new lesions Neurological: Negative for worsening tremors and other numbness  Psychiatric/Behavioral: Negative for worsening agitation or other fatigue All other system neg per pt    Objective:   Physical Exam BP (!) 146/92   Pulse 72   Temp 98.2 F (36.8 C) (Oral)   Ht '5\' 11"'  (1.803 m)   Wt 261 lb (118.4 kg)   SpO2 92%   BMI 36.40 kg/m  VS noted, mild ill Constitutional: Pt appears in NAD HENT: Head: NCAT.  Right Ear: External ear normal.  Left Ear: External ear normal.  Bilat tm's with mild erythema.  Max sinus areas mild tender.  Pharynx with mild erythema, no exudate  Eyes: . Pupils are equal, round, and reactive to light. Conjunctivae and EOM are normal Nose: without d/c or deformity Neck: Neck supple. Gross normal ROM Cardiovascular: Normal rate and regular rhythm.   Pulmonary/Chest: Effort normal and breath sounds without rales or wheezing.  Neurological: Pt is alert. At baseline orientation, motor grossly intact Skin: Skin is warm. No rashes, other new lesions, no LE edema Psychiatric: Pt behavior is normal without agitation  No other exam findings Lab Results  Component Value Date   WBC 10.5 08/25/2015   HGB 15.0 08/25/2015   HCT 46.0 08/25/2015   PLT 381 08/25/2015   GLUCOSE 109 (H) 01/18/2017   CHOL 146 11/19/2013   TRIG 145.0  11/19/2013   HDL 37.80 (L) 11/19/2013   LDLDIRECT 124.5 09/14/2012   LDLCALC 79 11/19/2013   ALT 19 06/17/2016   AST 22 06/17/2016   NA 139 01/18/2017   K 4.0 01/18/2017   CL 102 01/18/2017   CREATININE 1.18 01/18/2017   BUN 14 01/18/2017   CO2 28 01/18/2017   TSH 0.34 (L) 11/19/2013   PSA 2.15 01/18/2017   INR 1.72 (H) 07/05/2010   HGBA1C 6.9 (H) 01/18/2017   MICROALBUR 1.6 07/04/2016       Assessment & Plan:

## 2018-02-13 NOTE — Assessment & Plan Note (Signed)
stable overall by history and exam, recent data reviewed with pt, and pt to continue medical treatment as before,  to f/u any worsening symptoms or concerns Lab Results  Component Value Date   HGBA1C 6.9 (H) 01/18/2017

## 2018-02-13 NOTE — Assessment & Plan Note (Signed)
Mild to mod, for depomedrol IM 80, predpac asd, to f/u any worsening symptoms or concerns 

## 2018-02-13 NOTE — Assessment & Plan Note (Signed)
stable overall by history and exam, recent data reviewed with pt, and pt to continue medical treatment as before,  to f/u any worsening symptoms or concerns  

## 2018-02-13 NOTE — Patient Instructions (Signed)
You had the steroid shot today  Please take all new medication as prescribed- the antibiotic, and prednisone  Please continue all other medications as before, and refills have been done if requested.  Please have the pharmacy call with any other refills you may need.  Please continue your efforts at being more active, low cholesterol diet, and weight control.  You are otherwise up to date with prevention measures today.  Please keep your appointments with your specialists as you may have planned  You will be contacted regarding the referral for: colonoscopy for after jan 1  Please go to the LAB in the Basement (turn left off the elevator) for the tests to be done tomorrow  You will be contacted by phone if any changes need to be made immediately.  Otherwise, you will receive a letter about your results with an explanation, but please check with MyChart first.  Please remember to sign up for MyChart if you have not done so, as this will be important to you in the future with finding out test results, communicating by private email, and scheduling acute appointments online when needed.  Please return in 6 months, or sooner if needed

## 2018-02-14 LAB — URINALYSIS, ROUTINE W REFLEX MICROSCOPIC
Bilirubin Urine: NEGATIVE
Hgb urine dipstick: NEGATIVE
Ketones, ur: NEGATIVE
Leukocytes, UA: NEGATIVE
Nitrite: NEGATIVE
RBC / HPF: NONE SEEN (ref 0–?)
Specific Gravity, Urine: 1.015 (ref 1.000–1.030)
Total Protein, Urine: NEGATIVE
Urine Glucose: NEGATIVE
Urobilinogen, UA: 0.2 (ref 0.0–1.0)
pH: 5.5 (ref 5.0–8.0)

## 2018-02-19 ENCOUNTER — Other Ambulatory Visit: Payer: Self-pay | Admitting: Internal Medicine

## 2018-03-05 ENCOUNTER — Encounter: Payer: Self-pay | Admitting: Internal Medicine

## 2018-03-19 ENCOUNTER — Encounter: Payer: Self-pay | Admitting: Internal Medicine

## 2018-03-19 ENCOUNTER — Other Ambulatory Visit: Payer: Self-pay | Admitting: Internal Medicine

## 2018-03-19 MED ORDER — AMOXICILLIN-POT CLAVULANATE 875-125 MG PO TABS: 1.0000 | ORAL_TABLET | Freq: Two times a day (BID) | ORAL | 0 refills | Status: DC

## 2018-04-16 ENCOUNTER — Encounter: Payer: Medicare Other | Admitting: Internal Medicine

## 2018-04-24 ENCOUNTER — Ambulatory Visit: Payer: Medicare HMO | Admitting: Sports Medicine

## 2018-04-26 ENCOUNTER — Other Ambulatory Visit: Payer: Self-pay | Admitting: Internal Medicine

## 2018-04-26 DIAGNOSIS — I10 Essential (primary) hypertension: Secondary | ICD-10-CM

## 2018-04-30 DIAGNOSIS — Z0100 Encounter for examination of eyes and vision without abnormal findings: Secondary | ICD-10-CM | POA: Diagnosis not present

## 2018-05-01 ENCOUNTER — Ambulatory Visit (AMBULATORY_SURGERY_CENTER): Payer: Medicare HMO | Admitting: *Deleted

## 2018-05-01 ENCOUNTER — Encounter: Payer: Self-pay | Admitting: Internal Medicine

## 2018-05-01 VITALS — Ht 70.5 in | Wt 259.0 lb

## 2018-05-01 DIAGNOSIS — Z800 Family history of malignant neoplasm of digestive organs: Secondary | ICD-10-CM

## 2018-05-01 MED ORDER — PEG 3350-KCL-NA BICARB-NACL 420 G PO SOLR: 4000.0000 mL | Freq: Once | ORAL | 0 refills | Status: AC

## 2018-05-01 NOTE — Progress Notes (Signed)
Patient denies any allergies to eggs or soy. Patient denies any problems with anesthesia/sedation. Patient denies any oxygen use at home. Patient denies taking any diet/weight loss medications or blood thinners. EMMI education offered, pt declined.  

## 2018-05-03 DIAGNOSIS — M5416 Radiculopathy, lumbar region: Secondary | ICD-10-CM | POA: Diagnosis not present

## 2018-05-10 ENCOUNTER — Ambulatory Visit (AMBULATORY_SURGERY_CENTER): Payer: Medicare HMO | Admitting: Internal Medicine

## 2018-05-10 ENCOUNTER — Encounter: Payer: Self-pay | Admitting: Internal Medicine

## 2018-05-10 VITALS — BP 140/76 | HR 53 | Temp 97.3°F | Resp 13 | Ht 70.0 in | Wt 259.0 lb

## 2018-05-10 DIAGNOSIS — Z800 Family history of malignant neoplasm of digestive organs: Secondary | ICD-10-CM | POA: Diagnosis not present

## 2018-05-10 DIAGNOSIS — Z1211 Encounter for screening for malignant neoplasm of colon: Secondary | ICD-10-CM | POA: Diagnosis not present

## 2018-05-10 MED ORDER — SODIUM CHLORIDE 0.9 % IV SOLN: 500.0000 mL | Freq: Once | INTRAVENOUS | Status: DC

## 2018-05-10 NOTE — Patient Instructions (Addendum)
Thank you for allowing [place] to care for you today!  Recommend next screening colonoscopy in 5 years.  Resume previous diet and medications today.  Return to normal activities tomorrow.     YOU HAD AN ENDOSCOPIC PROCEDURE TODAY AT [facility]:   Refer to the procedure report that was given to you for any specific questions about what was found during the examination.  If the procedure report does not answer your questions, please call your gastroenterologist to clarify.  If you requested that your care partner not be given the details of your procedure findings, then the procedure report has been included in a sealed envelope for you to review at your convenience later.  YOU SHOULD EXPECT: Some feelings of bloating in the abdomen. Passage of more gas than usual.  Walking can help get rid of the air that was put into your GI tract during the procedure and reduce the bloating. If you had a lower endoscopy (such as a colonoscopy or flexible sigmoidoscopy) you may notice spotting of blood in your stool or on the toilet paper. If you underwent a bowel prep for your procedure, you may not have a normal bowel movement for a few days.  Please Note:  You might notice some irritation and congestion in your nose or some drainage.  This is from the oxygen used during your procedure.  There is no need for concern and it should clear up in a day or so.  SYMPTOMS TO REPORT IMMEDIATELY:   Following lower endoscopy (colonoscopy or flexible sigmoidoscopy):  Excessive amounts of blood in the stool  Significant tenderness or worsening of abdominal pains  Swelling of the abdomen that is new, acute  Fever of 100F or higher   For urgent or emergent issues, a gastroenterologist can be reached at any hour by calling [phone].   DIET:  We do recommend a small meal at first, but then you may proceed to your regular diet.  Drink plenty of fluids but you should avoid alcoholic beverages for 24  hours.  ACTIVITY:  You should plan to take it easy for the rest of today and you should NOT DRIVE or use heavy machinery until tomorrow (because of the sedation medicines used during the test).    FOLLOW UP: Our staff will call the number listed on your records the next business day following your procedure to check on you and address any questions or concerns that you may have regarding the information given to you following your procedure. If we do not reach you, we will leave a message.  However, if you are feeling well and you are not experiencing any problems, there is no need to return our call.  We will assume that you have returned to your regular daily activities without incident.  If any biopsies were taken you will be contacted by phone or by letter within the next 1-3 weeks.  Please call us at [phone] if you have not heard about the biopsies in 3 weeks.    SIGNATURES/CONFIDENTIALITY: You and/or your care partner have signed paperwork which will be entered into your electronic medical record.  These signatures attest to the fact that that the information above on your After Visit Summary has been reviewed and is understood.  Full responsibility of the confidentiality of this discharge information lies with you and/or your care-partner.

## 2018-05-10 NOTE — Progress Notes (Signed)
To PACU, VSS. Report to [email or web address] 

## 2018-05-10 NOTE — Progress Notes (Signed)
Pt's states no medical or surgical changes since previsit or office visit. 

## 2018-05-10 NOTE — Op Note (Signed)
[facility] Patient Name: Harold [name] Procedure Date: 05/10/2018 9:22 AM MRN: [id number] Endoscopist: Jerene Bears , MD Age: 53 Referring MD:  Date of Birth: 1966/03/08 Gender: Male Account #: [id number] Procedure:                Colonoscopy Indications:              Screening in patient at increased risk: Family                            history of 1st-degree relative with colorectal                            cancer before age 29 years, Last colonoscopy 5                            years ago Medicines:                Monitored Anesthesia Care Procedure:                Pre-Anesthesia Assessment:                           - Prior to the procedure, a History and Physical                            was performed, and patient medications and                            allergies were reviewed. The patient's tolerance of                            previous anesthesia was also reviewed. The risks                            and benefits of the procedure and the sedation                            options and risks were discussed with the patient.                            All questions were answered, and informed consent                            was obtained. Prior Anticoagulants: The patient has                            taken no previous anticoagulant or antiplatelet                            agents. ASA Grade Assessment: III - A patient with                            severe systemic disease. After reviewing the risks  and benefits, the patient was deemed in                            satisfactory condition to undergo the procedure.                           After obtaining informed consent, the colonoscope                            was passed under direct vision. Throughout the                            procedure, the patient's blood pressure, pulse, and                            oxygen saturations were monitored continuously. The                             Colonoscope was introduced through the anus and                            advanced to the the cecum, identified by                            appendiceal orifice and ileocecal valve. The                            colonoscopy was performed without difficulty. The                            patient tolerated the procedure well. The quality                            of the bowel preparation was excellent. The                            ileocecal valve, appendiceal orifice, and rectum                            were photographed. The bowel preparation used was                            GoLYTELY. Scope In: 9:34:26 AM Scope Out: 9:45:37 AM Scope Withdrawal Time: 0 hours 9 minutes 50 seconds  Total Procedure Duration: 0 hours 11 minutes 11 seconds  Findings:                 The digital rectal exam was normal.                           The entire examined colon appeared normal on direct                            and retroflexion views. Complications:            No immediate  complications. Estimated Blood Loss:     Estimated blood loss: none. Impression:               - The entire examined colon is normal on direct and                            retroflexion views.                           - No specimens collected. Recommendation:           - Patient has a contact number available for                            emergencies. The signs and symptoms of potential                            delayed complications were discussed with the                            patient. Return to normal activities tomorrow.                            Written discharge instructions were provided to the                            patient.                           - Resume previous diet.                           - Continue present medications.                           - Repeat colonoscopy in 5 years for screening                            purposes. Jerene Bears, MD 05/10/2018 9:49:10  AM This report has been signed electronically.

## 2018-05-11 ENCOUNTER — Telehealth: Payer: Self-pay | Admitting: *Deleted

## 2018-05-11 NOTE — Telephone Encounter (Signed)
  Follow up Call-  Call back number 05/10/2018  Post procedure Call Back phone  # [phone]  Permission to leave phone message Yes  Some recent data might be hidden     Patient questions:  Do you have a fever, pain , or abdominal swelling? No. Pain Score  0 *  Have you tolerated food without any problems? Yes.    Have you been able to return to your normal activities? Yes.    Do you have any questions about your discharge instructions: Diet   No. Medications  No. Follow up visit  No.  Do you have questions or concerns about your Care? No.  Actions: * If pain score is 4 or above: No action needed, pain <4.

## 2018-05-30 ENCOUNTER — Other Ambulatory Visit: Payer: Self-pay | Admitting: Internal Medicine

## 2018-05-30 NOTE — Telephone Encounter (Signed)
Ok to let pt know, we do not normally refill antibiotic prescriptions  Please consider OV if has an illness that he thinks may require antibiotic tx

## 2018-06-01 ENCOUNTER — Encounter: Payer: Self-pay | Admitting: Internal Medicine

## 2018-06-01 ENCOUNTER — Ambulatory Visit (INDEPENDENT_AMBULATORY_CARE_PROVIDER_SITE_OTHER): Payer: Medicare HMO | Admitting: Internal Medicine

## 2018-06-01 VITALS — BP 144/90 | HR 76 | Temp 98.5°F | Ht 70.0 in | Wt 266.0 lb

## 2018-06-01 DIAGNOSIS — E119 Type 2 diabetes mellitus without complications: Secondary | ICD-10-CM | POA: Diagnosis not present

## 2018-06-01 DIAGNOSIS — E781 Pure hyperglyceridemia: Secondary | ICD-10-CM

## 2018-06-01 DIAGNOSIS — J309 Allergic rhinitis, unspecified: Secondary | ICD-10-CM | POA: Diagnosis not present

## 2018-06-01 DIAGNOSIS — R059 Cough, unspecified: Secondary | ICD-10-CM

## 2018-06-01 DIAGNOSIS — Z0000 Encounter for general adult medical examination without abnormal findings: Secondary | ICD-10-CM

## 2018-06-01 DIAGNOSIS — I10 Essential (primary) hypertension: Secondary | ICD-10-CM

## 2018-06-01 DIAGNOSIS — R05 Cough: Secondary | ICD-10-CM | POA: Diagnosis not present

## 2018-06-01 DIAGNOSIS — E1165 Type 2 diabetes mellitus with hyperglycemia: Secondary | ICD-10-CM | POA: Diagnosis not present

## 2018-06-01 MED ORDER — LISINOPRIL 40 MG PO TABS: 40.0000 mg | ORAL_TABLET | Freq: Every day | ORAL | 3 refills | Status: DC

## 2018-06-01 MED ORDER — HYDROCODONE-HOMATROPINE 5-1.5 MG/5ML PO SYRP: 5.0000 mL | ORAL_SOLUTION | Freq: Four times a day (QID) | ORAL | 0 refills | Status: AC | PRN

## 2018-06-01 MED ORDER — AZITHROMYCIN 250 MG PO TABS: ORAL_TABLET | ORAL | 1 refills | Status: DC

## 2018-06-01 MED ORDER — FENOFIBRATE 145 MG PO TABS: 145.0000 mg | ORAL_TABLET | Freq: Every day | ORAL | 3 refills | Status: DC

## 2018-06-01 MED ORDER — METHYLPREDNISOLONE ACETATE 80 MG/ML IJ SUSP
80.0000 mg | Freq: Once | INTRAMUSCULAR | Status: AC
  Administered 2018-06-01: 80 mg via INTRAMUSCULAR

## 2018-06-01 MED ORDER — METFORMIN HCL ER 500 MG PO TB24: 1000.0000 mg | ORAL_TABLET | Freq: Every day | ORAL | 3 refills | Status: DC

## 2018-06-01 NOTE — Assessment & Plan Note (Signed)
Mild to mod, c/w bronchitis vs pna, declines cxr, for antibx course, cough med prn,  to f/u any worsening symptoms or concerns 

## 2018-06-01 NOTE — Patient Instructions (Addendum)
You had the steroid shot today  Please take all new medication as prescribed - the antibiotic, cough medicine as needed  OK to change to metformin ER 500 mg - 2 pills in the AM  OK to change the lisinopril to 40 mg in the AM  Please continue all other medications as before, including to restart the tricor  Please have the pharmacy call with any other refills you may need.  Please continue your efforts at being more active, low cholesterol diet, and weight control.  You are otherwise up to date with prevention measures today.  Please keep your appointments with your specialists as you may have planned  Please return in 3 months, or sooner if needed, with Lab testing done 3-5 days before

## 2018-06-01 NOTE — Progress Notes (Signed)
Subjective:    Patient ID: Harold [name], male    DOB: [birth year], 53 y.o.   MRN: [id number]  HPI  Here with acute onset mild to mod 2-3 days ST, HA, general weakness and malaise, with prod cough greenish sputum, but Pt denies chest pain, increased sob or doe, wheezing, orthopnea, PND, increased LE swelling, palpitations, dizziness or syncope. .Does have several wks ongoing nasal allergy symptoms with clearish congestion, itch and sneezing, without fever, pain, ST, cough, swelling or wheezing.  Has been out of tricor 145 for some time, asks to restart.  Would like to take all meds in the AM if possible.  Was missing PM does metformin every few days with nov 2019 a1c 7.3 Past Medical History:  Diagnosis Date  . Acute pericarditis    admx 03-26-10 thru 03-29-10; a. echo 03-26-10: EF 55-60%; mild LVH; trivial MR; RVF ok; mild -mod circumferential Eff w/o tamponade  . Allergic rhinitis, cause unspecified 09/14/2012  . Allergy   . Anxiety   . Chronic back pain   . DDD (degenerative disc disease), lumbar 09/14/2012  . Depression   . Diabetes mellitus without complication (HCC)    borderline   . Erectile dysfunction 09/14/2012  . GERD (gastroesophageal reflux disease)   . Gout    pt denies  . Headache    migraines in the past  . History of pancreatitis    a. admx 04-2009.Marland KitchenMarland Kitchen? 2-2 triglycerides  . Hyperlipidemia 07/11/2013  . Hypertension   . Hypertriglyceridemia    a. followed by [facility]  . Left sided sciatica 09/14/2012  . Metabolic syndrome   . Obesity   . OSA (obstructive sleep apnea)    CPAP  . Restless legs    Past Surgical History:  Procedure Laterality Date  . COLONOSCOPY    . KNEE SURGERY     right  . LUMBAR LAMINECTOMY/DECOMPRESSION MICRODISCECTOMY N/A 08/26/2015   Procedure: Lumbar Two-Sacral One  Laminectomy for decompression;  Surgeon: [name], MD;  Location: [facility];  Service: Neurosurgery;  Laterality: N/A;  . pericardectomy  07/15/2010   Dr.  [name]  . pleurx catheter placement  07/15/2010   Dr. [name]  . SHOULDER SURGERY     right and left shoulders  . TONSILLECTOMY AND ADENOIDECTOMY     one tonsil    reports that he has been smoking cigarettes. He has a 10.00 pack-year smoking history. He has never used smokeless tobacco. He reports current alcohol use of about 2.0 standard drinks of alcohol per week. He reports that he does not use drugs. family history includes Colon cancer (age of onset: 49) in his father; Diabetes in his father, mother, and sister; Heart disease (age of onset: 82) in his father; Hypertension in an other family member; Liver cancer in his paternal uncle; Pancreatitis in his father and paternal uncle; Stomach cancer in his maternal grandfather. Allergies  Allergen Reactions  . Other Itching, Swelling and Palpitations    Pecans: itching and swelling of the tongue   . Diphenhydramine Itching, Palpitations and Other (See Comments)    "jittery"  . Colchicine Diarrhea  . Lipitor [Atorvastatin] Other (See Comments)    Muscle cramps  . Septra [Sulfamethoxazole-Trimethoprim] Itching   Current Outpatient Medications on File Prior to Visit  Medication Sig Dispense Refill  . amLODipine (NORVASC) 10 MG tablet Take 1 tablet (10 mg total) by mouth daily. 90 tablet 3  . ammonium lactate (AMLACTIN) 12 % lotion Apply 1 application topically as needed  for dry skin. 400 g 3  . aspirin EC 81 MG tablet Take 81 mg by mouth daily.    . Blood Glucose Monitoring Suppl (ACCU-CHEK NANO SMARTVIEW) W/DEVICE KIT Use glucose meter to check blood sugar 1-3 times daily as directed. 1 kit 0  . cetirizine (ZYRTEC) 5 MG tablet Take 5 mg by mouth daily as needed for allergies.     . citalopram (CELEXA) 40 MG tablet TAKE ONE TABLET BY MOUTH DAILY 90 tablet 0  . fentaNYL (DURAGESIC - DOSED MCG/HR) 50 MCG/HR Place 50 mcg onto the skin every 3 (three) days.    . furosemide (LASIX) 40 MG tablet TAKE ONE TABLET BY MOUTH DAILY 90 tablet 0  .  gabapentin (NEURONTIN) 300 MG capsule Take 900 mg by mouth 3 (three) times daily.    [name] glucose blood (ACCU-CHEK SMARTVIEW) test strip Use as instructed 100 each 12  . ibuprofen (ADVIL,MOTRIN) 800 MG tablet Take 800 mg by mouth every 8 (eight) hours as needed.    [name] OVER THE COUNTER MEDICATION Take 500 mg by mouth daily.    [name] oxyCODONE-acetaminophen (PERCOCET) 10-325 MG tablet Take 1 tablet by mouth every 6 (six) hours as needed for pain. 90 tablet 0  . Potassium Chloride ER 20 MEQ TBCR Take 1 tablet by mouth daily. 90 tablet 3  . pseudoephedrine (SUDAFED) 120 MG 12 hr tablet Take 1 tablet (120 mg total) by mouth 2 (two) times daily as needed for congestion. 20 tablet 1  . rosuvastatin (CRESTOR) 20 MG tablet TAKE ONE TABLET BY MOUTH DAILY. 90 tablet 0  . sildenafil (REVATIO) 20 MG tablet Take 1-5 tablets (20-100 mg total) by mouth daily as needed. 50 tablet 2  . terbinafine (LAMISIL AT) 1 % cream Apply 1 application topically daily as needed (foot fungus). 42 g 0  . terbinafine (LAMISIL) 250 MG tablet TAKE ONE TABLET BY MOUTH DAILY 90 tablet 0  . TRUEPLUS LANCETS 33G MISC Use to check blood sugars twice a day 300 each 1  . VOLTAREN 1 % GEL Apply 2 g topically 4 (four) times daily. 100 g 5   No current facility-administered medications on file prior to visit.    Review of Systems  Constitutional: Negative for other unusual diaphoresis or sweats HENT: Negative for ear discharge or swelling Eyes: Negative for other worsening visual disturbances Respiratory: Negative for stridor or other swelling  Gastrointestinal: Negative for worsening distension or other blood Genitourinary: Negative for retention or other urinary change Musculoskeletal: Negative for other MSK pain or swelling Skin: Negative for color change or other new lesions Neurological: Negative for worsening tremors and other numbness  Psychiatric/Behavioral: Negative for worsening agitation or other fatigue All other system neg per  pt    Objective:   Physical Exam BP (!) 144/90   Pulse 76   Temp 98.5 F (36.9 C) (Oral)   Ht _0  (1.778 m)   Wt 266 lb (120.7 kg)   SpO2 94%   BMI 38.17 kg/m  VS noted, mild ill Constitutional: Pt appears in NAD HENT: Head: NCAT.  Right Ear: External ear normal.  Left Ear: External ear normal.  Eyes: . Pupils are equal, round, and reactive to light. Conjunctivae and EOM are normal Bilat tm's with mild erythema.  Max sinus areas mild tender.  Pharynx with mild erythema, no exudate Nose: without d/c or deformity Neck: Neck supple. Gross normal ROM Cardiovascular: Normal rate and regular rhythm.   Pulmonary/Chest: Effort normal and breath sounds without rales or  wheezing.  Neurological: Pt is alert. At baseline orientation, motor grossly intact Skin: Skin is warm. No rashes, other new lesions, no LE edema Psychiatric: Pt behavior is normal without agitation  No other exam findings Lab Results  Component Value Date   WBC 13.2 (H) 02/13/2018   HGB 15.3 02/13/2018   HCT 45.3 02/13/2018   PLT 351.0 02/13/2018   GLUCOSE 159 (H) 02/13/2018   CHOL 135 02/13/2018   TRIG (H) 02/13/2018    552.0 Triglyceride is over 400; calculations on Lipids are invalid.   HDL 29.30 (L) 02/13/2018   LDLDIRECT 53.0 02/13/2018   LDLCALC 79 11/19/2013   ALT 18 02/13/2018   AST 17 02/13/2018   NA 135 02/13/2018   K 4.2 02/13/2018   CL 99 02/13/2018   CREATININE 0.96 02/13/2018   BUN 11 02/13/2018   CO2 26 02/13/2018   TSH 0.45 02/13/2018   PSA 1.29 02/13/2018   INR 1.72 (H) 07/05/2010   HGBA1C 7.3 (H) 02/13/2018   MICROALBUR 0.8 02/13/2018         Assessment & Plan:

## 2018-06-01 NOTE — Assessment & Plan Note (Signed)
Mild uncontrolled, to change to metformin ER 500 - 2 pills in the AM

## 2018-06-01 NOTE — Assessment & Plan Note (Signed)
Mild to mod, for depomedrol IM 80,,  to f/u any worsening symptoms or concerns 

## 2018-06-01 NOTE — Assessment & Plan Note (Signed)
Mild to mod, for tricor restart,  to f/u any worsening symptoms or concerns

## 2018-06-01 NOTE — Assessment & Plan Note (Signed)
Ok to take all lisinopril 20 bid ad 40 qam,  to f/u any worsening symptoms or concerns

## 2018-06-05 DIAGNOSIS — M25561 Pain in right knee: Secondary | ICD-10-CM | POA: Diagnosis not present

## 2018-06-18 ENCOUNTER — Other Ambulatory Visit: Payer: Self-pay | Admitting: Internal Medicine

## 2018-06-22 ENCOUNTER — Other Ambulatory Visit: Payer: Self-pay | Admitting: Internal Medicine

## 2018-06-28 MED ORDER — TOLTERODINE TARTRATE 2 MG PO TABS: 2.0000 mg | ORAL_TABLET | Freq: Two times a day (BID) | ORAL | 3 refills | Status: DC

## 2018-06-28 NOTE — Telephone Encounter (Signed)
Ok for refill of the detrol 2 mg twice per day as per Caryl Pina NP last yr.  I will send, thanks

## 2018-07-18 ENCOUNTER — Ambulatory Visit (INDEPENDENT_AMBULATORY_CARE_PROVIDER_SITE_OTHER): Payer: Medicare HMO | Admitting: Internal Medicine

## 2018-07-18 ENCOUNTER — Encounter: Payer: Self-pay | Admitting: Internal Medicine

## 2018-07-18 DIAGNOSIS — J309 Allergic rhinitis, unspecified: Secondary | ICD-10-CM | POA: Diagnosis not present

## 2018-07-18 DIAGNOSIS — E119 Type 2 diabetes mellitus without complications: Secondary | ICD-10-CM | POA: Diagnosis not present

## 2018-07-18 DIAGNOSIS — J0140 Acute pansinusitis, unspecified: Secondary | ICD-10-CM

## 2018-07-18 MED ORDER — TRIAMCINOLONE ACETONIDE 55 MCG/ACT NA AERO: 2.0000 | INHALATION_SPRAY | Freq: Every day | NASAL | 12 refills | Status: DC

## 2018-07-18 MED ORDER — METHYLPREDNISOLONE ACETATE 80 MG/ML IJ SUSP
80.0000 mg | Freq: Once | INTRAMUSCULAR | Status: AC
  Administered 2018-07-18: 80 mg via INTRAMUSCULAR

## 2018-07-18 MED ORDER — PREDNISONE 10 MG PO TABS: ORAL_TABLET | ORAL | 0 refills | Status: DC

## 2018-07-18 MED ORDER — LEVOFLOXACIN 500 MG PO TABS: 500.0000 mg | ORAL_TABLET | Freq: Every day | ORAL | 0 refills | Status: AC

## 2018-07-18 NOTE — Assessment & Plan Note (Signed)
Ok for depomedrol 80 IM, predpac asd, continue claritin, add nasacort asd,  to f/u any worsening symptoms or concerns

## 2018-07-18 NOTE — Telephone Encounter (Signed)
Staff to offer pt virtual visit

## 2018-07-18 NOTE — Patient Instructions (Signed)
You had the steroid shot today  Please take all new medication as prescribed - the antibiotic, and prednisone, and nasacort  Please continue all other medications as before, including the claritin  Please have the pharmacy call with any other refills you may need.  Please keep your appointments with your specialists as you may have planned

## 2018-07-18 NOTE — Assessment & Plan Note (Signed)
stable overall by history and exam, recent data reviewed with pt, and pt to continue medical treatment as before,  to f/u any worsening symptoms or concerns  

## 2018-07-18 NOTE — Telephone Encounter (Signed)
Doxy scheduled.

## 2018-07-18 NOTE — Assessment & Plan Note (Signed)
Mild to mod, for antibx course,  to f/u any worsening symptoms or concerns 

## 2018-07-18 NOTE — Addendum Note (Signed)
Addended by: Juliet Rude on: 07/18/2018 03:46 PM   Modules accepted: Orders

## 2018-07-18 NOTE — Progress Notes (Signed)
Patient ID: Harold [name], male   DOB: [birth year], 53 y.o.   MRN: [id number]  Virtual Visit via Video Note  I connected with Harold [name] on 07/18/18 at  2:20 PM EDT by a video enabled telemedicine application and verified that I am speaking with the correct person using two identifiers. I am at the office, pt is at home, no others present   I discussed the limitations of evaluation and management by telemedicine and the availability of in person appointments. The patient expressed understanding and agreed to proceed.  History of Present Illness:  Here with 2-3 days acute onset fever, facial pain, pressure, headache, general weakness and malaise, and greenish d/c, with mild ST and cough, but pt denies chest pain, wheezing, increased sob or doe, orthopnea, PND, increased LE swelling, palpitations, dizziness or syncope.  Does have several wks ongoing nasal allergy symptoms with clearish congestion, itch and sneezing, without fever, pain, ST, cough, swelling or wheezing.   Pt denies polydipsia, polyuria, or low sugar symptoms Past Medical History:  Diagnosis Date  . Acute pericarditis    admx 03-26-10 thru 03-29-10; a. echo 03-26-10: EF 55-60%; mild LVH; trivial MR; RVF ok; mild -mod circumferential Eff w/o tamponade  . Allergic rhinitis, cause unspecified 09/14/2012  . Allergy   . Anxiety   . Chronic back pain   . DDD (degenerative disc disease), lumbar 09/14/2012  . Depression   . Diabetes mellitus without complication (HCC)    borderline   . Erectile dysfunction 09/14/2012  . GERD (gastroesophageal reflux disease)   . Gout    pt denies  . Headache    migraines in the past  . History of pancreatitis    a. admx 04-2009.Marland KitchenMarland Kitchen? 2-2 triglycerides  . Hyperlipidemia 07/11/2013  . Hypertension   . Hypertriglyceridemia    a. followed by [facility]  . Left sided sciatica 09/14/2012  . Metabolic syndrome   . Obesity   . OSA (obstructive sleep apnea)    CPAP  . Restless legs    Past  Surgical History:  Procedure Laterality Date  . COLONOSCOPY    . KNEE SURGERY     right  . LUMBAR LAMINECTOMY/DECOMPRESSION MICRODISCECTOMY N/A 08/26/2015   Procedure: Lumbar Two-Sacral One  Laminectomy for decompression;  Surgeon: [name], MD;  Location: [facility];  Service: Neurosurgery;  Laterality: N/A;  . pericardectomy  07/15/2010   Dr. [name]  . pleurx catheter placement  07/15/2010   Dr. [name]  . SHOULDER SURGERY     right and left shoulders  . TONSILLECTOMY AND ADENOIDECTOMY     one tonsil    reports that he has been smoking cigarettes. He has a 10.00 pack-year smoking history. He has never used smokeless tobacco. He reports current alcohol use of about 2.0 standard drinks of alcohol per week. He reports that he does not use drugs. family history includes Colon cancer (age of onset: 8) in his father; Diabetes in his father, mother, and sister; Heart disease (age of onset: 81) in his father; Hypertension in an other family member; Liver cancer in his paternal uncle; Pancreatitis in his father and paternal uncle; Stomach cancer in his maternal grandfather. Allergies  Allergen Reactions  . Other Itching, Swelling and Palpitations    Pecans: itching and swelling of the tongue   . Diphenhydramine Itching, Palpitations and Other (See Comments)    "jittery"  . Colchicine Diarrhea  . Lipitor [Atorvastatin] Other (See Comments)    Muscle cramps  . Septra [  Sulfamethoxazole-Trimethoprim] Itching   Current Outpatient Medications on File Prior to Visit  Medication Sig Dispense Refill  . amLODipine (NORVASC) 10 MG tablet Take 1 tablet (10 mg total) by mouth daily. 90 tablet 3  . ammonium lactate (AMLACTIN) 12 % lotion Apply 1 application topically as needed for dry skin. 400 g 3  . aspirin EC 81 MG tablet Take 81 mg by mouth daily.    . Blood Glucose Monitoring Suppl (ACCU-CHEK NANO SMARTVIEW) W/DEVICE KIT Use glucose meter to check blood sugar 1-3 times daily as  directed. 1 kit 0  . cetirizine (ZYRTEC) 5 MG tablet Take 5 mg by mouth daily as needed for allergies.     . citalopram (CELEXA) 40 MG tablet TAKE ONE TABLET BY MOUTH DAILY 90 tablet 0  . fenofibrate (TRICOR) 145 MG tablet Take 1 tablet (145 mg total) by mouth daily. 90 tablet 3  . fentaNYL (DURAGESIC - DOSED MCG/HR) 50 MCG/HR Place 50 mcg onto the skin every 3 (three) days.    . furosemide (LASIX) 40 MG tablet TAKE ONE TABLET BY MOUTH DAILY 90 tablet 0  . gabapentin (NEURONTIN) 300 MG capsule Take 900 mg by mouth 3 (three) times daily.    [name] glucose blood (ACCU-CHEK SMARTVIEW) test strip Use as instructed 100 each 12  . ibuprofen (ADVIL,MOTRIN) 800 MG tablet Take 800 mg by mouth every 8 (eight) hours as needed.    [name] lisinopril (PRINIVIL,ZESTRIL) 40 MG tablet Take 1 tablet (40 mg total) by mouth daily. 90 tablet 3  . metFORMIN (GLUCOPHAGE-XR) 500 MG 24 hr tablet Take 2 tablets (1,000 mg total) by mouth daily with breakfast. 180 tablet 3  . OVER THE COUNTER MEDICATION Take 500 mg by mouth daily.    [name] oxyCODONE-acetaminophen (PERCOCET) 10-325 MG tablet Take 1 tablet by mouth every 6 (six) hours as needed for pain. 90 tablet 0  . Potassium Chloride ER 20 MEQ TBCR Take 1 tablet by mouth daily. 90 tablet 3  . rosuvastatin (CRESTOR) 20 MG tablet TAKE ONE TABLET BY MOUTH DAILY. 90 tablet 0  . sildenafil (REVATIO) 20 MG tablet Take 1-5 tablets (20-100 mg total) by mouth daily as needed. 50 tablet 2  . terbinafine (LAMISIL AT) 1 % cream Apply 1 application topically daily as needed (foot fungus). 42 g 0  . terbinafine (LAMISIL) 250 MG tablet TAKE ONE TABLET BY MOUTH DAILY 90 tablet 0  . tolterodine (DETROL) 2 MG tablet Take 1 tablet (2 mg total) by mouth 2 (two) times daily. 180 tablet 3  . TRUEPLUS LANCETS 33G MISC Use to check blood sugars twice a day 300 each 1  . VOLTAREN 1 % GEL Apply 2 g topically 4 (four) times daily. 100 g 5   No current facility-administered medications on file prior to visit.      Observations/Objective: Alert, mild ill o/w NAD, resp normal, facies swollen and tender over maxillary areas per pt, cn 2-12 intact, no other rash Lab Results  Component Value Date   WBC 13.2 (H) 02/13/2018   HGB 15.3 02/13/2018   HCT 45.3 02/13/2018   PLT 351.0 02/13/2018   GLUCOSE 159 (H) 02/13/2018   CHOL 135 02/13/2018   TRIG (H) 02/13/2018    552.0 Triglyceride is over 400; calculations on Lipids are invalid.   HDL 29.30 (L) 02/13/2018   LDLDIRECT 53.0 02/13/2018   LDLCALC 79 11/19/2013   ALT 18 02/13/2018   AST 17 02/13/2018   NA 135 02/13/2018   K 4.2 02/13/2018  CL 99 02/13/2018   CREATININE 0.96 02/13/2018   BUN 11 02/13/2018   CO2 26 02/13/2018   TSH 0.45 02/13/2018   PSA 1.29 02/13/2018   INR 1.72 (H) 07/05/2010   HGBA1C 7.3 (H) 02/13/2018   MICROALBUR 0.8 02/13/2018   Assessment and Plan: See notes  Follow Up Instructions: See notes   I discussed the assessment and treatment plan with the patient. The patient was provided an opportunity to ask questions and all were answered. The patient agreed with the plan and demonstrated an understanding of the instructions.   The patient was advised to call back or seek an in-person evaluation if the symptoms worsen or if the condition fails to improve as anticipated.   Cathlean Cower, MD

## 2018-07-25 ENCOUNTER — Telehealth: Payer: Self-pay | Admitting: Cardiology

## 2018-07-25 NOTE — Progress Notes (Signed)
Virtual Visit via Video Note   This visit type was conducted due to national recommendations for restrictions regarding the COVID-19 Pandemic (e.g. social distancing) in an effort to limit this patient's exposure and mitigate transmission in our community.  Due to his co-morbid illnesses, this patient is at least at moderate risk for complications without adequate follow up.  This format is felt to be most appropriate for this patient at this time.  All issues noted in this document were discussed and addressed.  A limited physical exam was performed with this format.  Please refer to the patient's chart for his consent to telehealth for [facility].   Evaluation Performed:  Follow-up visit  Date:  07/26/2018   ID:  Harold [name], DOB 1965-09-22, MRN [id number]  Patient Location: Home Provider Location: Home  PCP:  Biagio Borg, MD   Cardiologist:  Minus Breeding, MD  Electrophysiologist:  None   Chief Complaint:  Pericarditis  History of Present Illness:    Harold [name] is a 53 y.o. male with who presents for evaluation of multiple cardiovascular risk factors.  I saw him in the past for pericarditis.    In 2017 he had a POET (Plain Old Exercise Treadmill) after the last visit.  He had no ischemia but did have a hypertensive response.     Since I last saw him he has done well.  The patient denies any new symptoms such as chest discomfort, neck or arm discomfort. There has been no new shortness of breath, PND or orthopnea. There have been no reported palpitations, presyncope or syncope.  His weight is up a little.  He wants to get the mirror that you use to exercise.  He is not currently exercising much.    The patient does not have symptoms concerning for COVID-19 infection (fever, chills, cough, or new shortness of breath).    Past Medical History:  Diagnosis Date  . Acute pericarditis    admx 03-26-10 thru 03-29-10; a. echo 03-26-10: EF 55-60%; mild LVH; trivial MR; RVF ok;  mild -mod circumferential Eff w/o tamponade  . Allergic rhinitis, cause unspecified 09/14/2012  . Allergy   . Anxiety   . Chronic back pain   . DDD (degenerative disc disease), lumbar 09/14/2012  . Depression   . Diabetes mellitus without complication (HCC)    borderline   . Erectile dysfunction 09/14/2012  . GERD (gastroesophageal reflux disease)   . Gout    pt denies  . Headache    migraines in the past  . History of pancreatitis    a. admx 04-2009.Marland KitchenMarland Kitchen? 2-2 triglycerides  . Hyperlipidemia 07/11/2013  . Hypertension   . Hypertriglyceridemia    a. followed by [facility]  . Left sided sciatica 09/14/2012  . Metabolic syndrome   . Obesity   . OSA (obstructive sleep apnea)    CPAP  . Restless legs    Past Surgical History:  Procedure Laterality Date  . COLONOSCOPY    . KNEE SURGERY     right  . LUMBAR LAMINECTOMY/DECOMPRESSION MICRODISCECTOMY N/A 08/26/2015   Procedure: Lumbar Two-Sacral One  Laminectomy for decompression;  Surgeon: [name], MD;  Location: [facility];  Service: Neurosurgery;  Laterality: N/A;  . pericardectomy  07/15/2010   Dr. [name]  . pleurx catheter placement  07/15/2010   Dr. [name]  . SHOULDER SURGERY     right and left shoulders  . TONSILLECTOMY AND ADENOIDECTOMY     one tonsil  Current Meds  Medication Sig  . amLODipine (NORVASC) 10 MG tablet Take 1 tablet (10 mg total) by mouth daily.  [name] ammonium lactate (AMLACTIN) 12 % lotion Apply 1 application topically as needed for dry skin.  [name] aspirin EC 81 MG tablet Take 81 mg by mouth daily.  . cetirizine (ZYRTEC) 5 MG tablet Take 5 mg by mouth daily as needed for allergies.   . fenofibrate (TRICOR) 145 MG tablet Take 1 tablet (145 mg total) by mouth daily.  . fentaNYL (DURAGESIC - DOSED MCG/HR) 50 MCG/HR Place 50 mcg onto the skin every 3 (three) days.  . furosemide (LASIX) 40 MG tablet TAKE ONE TABLET BY MOUTH DAILY  . gabapentin (NEURONTIN) 300 MG capsule Take 900 mg by mouth 3  (three) times daily.  [name] glucose blood (ACCU-CHEK SMARTVIEW) test strip Use as instructed  . ibuprofen (ADVIL,MOTRIN) 800 MG tablet Take 800 mg by mouth every 8 (eight) hours as needed.  [name] levofloxacin (LEVAQUIN) 500 MG tablet Take 1 tablet (500 mg total) by mouth daily for 10 days.  [name] lisinopril (PRINIVIL,ZESTRIL) 40 MG tablet Take 1 tablet (40 mg total) by mouth daily.  . metFORMIN (GLUCOPHAGE-XR) 500 MG 24 hr tablet Take 2 tablets (1,000 mg total) by mouth daily with breakfast.  . OVER THE COUNTER MEDICATION Take 500 mg by mouth daily.  [name] oxyCODONE-acetaminophen (PERCOCET) 10-325 MG tablet Take 1 tablet by mouth every 6 (six) hours as needed for pain.  [name] Potassium Chloride ER 20 MEQ TBCR Take 1 tablet by mouth daily.  . predniSONE (DELTASONE) 10 MG tablet 3 tabs by mouth per day for 3 days,2tabs per day for 3 days,1tab per day for 3 days  . rosuvastatin (CRESTOR) 20 MG tablet TAKE ONE TABLET BY MOUTH DAILY.  . sildenafil (REVATIO) 20 MG tablet Take 1-5 tablets (20-100 mg total) by mouth daily as needed.  . terbinafine (LAMISIL AT) 1 % cream Apply 1 application topically daily as needed (foot fungus).  . terbinafine (LAMISIL) 250 MG tablet TAKE ONE TABLET BY MOUTH DAILY  . tolterodine (DETROL) 2 MG tablet Take 1 tablet (2 mg total) by mouth 2 (two) times daily.  [name] triamcinolone (NASACORT) 55 MCG/ACT AERO nasal inhaler Place 2 sprays into the nose daily.  . TRUEPLUS LANCETS 33G MISC Use to check blood sugars twice a day  . VOLTAREN 1 % GEL Apply 2 g topically 4 (four) times daily.     Allergies:   Other; Diphenhydramine; Colchicine; Lipitor [atorvastatin]; and Septra [sulfamethoxazole-trimethoprim]   Social History   Tobacco Use  . Smoking status: Current Every Day Smoker    Packs/day: 0.50    Years: 20.00    Pack years: 10.00    Types: Cigarettes  . Smokeless tobacco: Never Used  Substance Use Topics  . Alcohol use: Yes    Alcohol/week: 2.0 standard drinks    Types: 2 Shots of  liquor per week    Comment: occasional   . Drug use: No     Family Hx: The patient's family history includes Colon cancer (age of onset: 100) in his father; Diabetes in his father, mother, and sister; Heart disease (age of onset: 68) in his father; Hypertension in an other family member; Liver cancer in his paternal uncle; Pancreatitis in his father and paternal uncle; Stomach cancer in his maternal grandfather. There is no history of Esophageal cancer, Rectal cancer, or Colon polyps.  ROS:   Please see the history of present illness.    As stated in  the HPI and negative for all other systems.   Prior CV studies:   The following studies were reviewed today:    Labs/Other Tests and Data Reviewed:    EKG:  No ECG reviewed.  Recent Labs: 02/13/2018: ALT 18; BUN 11; Creatinine, Ser 0.96; Hemoglobin 15.3; Platelets 351.0; Potassium 4.2; Sodium 135; TSH 0.45   Recent Lipid Panel Lab Results  Component Value Date/Time   CHOL 135 02/13/2018 04:41 PM   TRIG (H) 02/13/2018 04:41 PM    552.0 Triglyceride is over 400; calculations on Lipids are invalid.   HDL 29.30 (L) 02/13/2018 04:41 PM   CHOLHDL 5 02/13/2018 04:41 PM   LDLCALC 79 11/19/2013 12:06 PM   LDLDIRECT 53.0 02/13/2018 04:41 PM    Wt Readings from Last 3 Encounters:  07/26/18 263 lb (119.3 kg)  06/01/18 266 lb (120.7 kg)  05/10/18 259 lb (117.5 kg)     Objective:    Vital Signs:  BP (!) 163/92   Pulse 60   Ht 5\' 10"  (1.778 m)   Wt 263 lb (119.3 kg)   BMI 37.74 kg/m    VITAL SIGNS:  reviewed GEN:  no acute distress EYES:  sclerae anicteric, EOMI - Extraocular Movements Intact NEURO:  alert and oriented x 3, no obvious focal deficit PSYCH:  normal affect  ASSESSMENT & PLAN:    OBESITY:     He and I talked again about this and he understands diet and exercise.    DM:   His A1c is being followed by Biagio Borg, MD.  His A1c was 7.3 in Nov.    RISK REDUCTION:     I had previously given him the number for 1  800 QUITNOW.   This is his number one health priority.   I talked to his wife about this as well with his permission.   HTN:     BP is elevated today but he assures me that this is unusual.  No change in therapy.  He needs weight loss.    BP is controlled barely and will come down with weight loss.    SLEEP APNEA:   He did get a new machine but is waiting for a follow up sleep study.    COVID-19 Education: The signs and symptoms of COVID-19 were discussed with the patient and how to seek care for testing (follow up with PCP or arrange E-visit).  The importance of social distancing was discussed today.  Time:   Today, I have spent 16 minutes with the patient with telehealth technology discussing the above problems.     Medication Adjustments/Labs and Tests Ordered: Current medicines are reviewed at length with the patient today.  Concerns regarding medicines are outlined above.   Tests Ordered: No orders of the defined types were placed in this encounter.   Medication Changes: No orders of the defined types were placed in this encounter.   Disposition:  Follow up with me in one year  Signed, Minus Breeding, MD  07/26/2018 12:13 PM    [place]

## 2018-07-26 ENCOUNTER — Telehealth (INDEPENDENT_AMBULATORY_CARE_PROVIDER_SITE_OTHER): Payer: Medicare HMO | Admitting: Cardiology

## 2018-07-26 ENCOUNTER — Encounter: Payer: Self-pay | Admitting: Cardiology

## 2018-07-26 VITALS — BP 163/92 | HR 60 | Ht 70.0 in | Wt 263.0 lb

## 2018-07-26 DIAGNOSIS — I10 Essential (primary) hypertension: Secondary | ICD-10-CM

## 2018-07-26 DIAGNOSIS — G4733 Obstructive sleep apnea (adult) (pediatric): Secondary | ICD-10-CM

## 2018-07-26 DIAGNOSIS — E663 Overweight: Secondary | ICD-10-CM

## 2018-07-26 DIAGNOSIS — Z720 Tobacco use: Secondary | ICD-10-CM

## 2018-08-01 ENCOUNTER — Telehealth: Payer: Self-pay | Admitting: Internal Medicine

## 2018-08-01 ENCOUNTER — Encounter: Payer: Self-pay | Admitting: Internal Medicine

## 2018-08-01 MED ORDER — FLUCONAZOLE 100 MG PO TABS: ORAL_TABLET | ORAL | 0 refills | Status: DC

## 2018-08-01 NOTE — Telephone Encounter (Signed)
Done erx 

## 2018-08-02 DIAGNOSIS — M5416 Radiculopathy, lumbar region: Secondary | ICD-10-CM | POA: Diagnosis not present

## 2018-08-06 ENCOUNTER — Encounter: Payer: Self-pay | Admitting: Internal Medicine

## 2018-08-06 ENCOUNTER — Other Ambulatory Visit: Payer: Self-pay | Admitting: Internal Medicine

## 2018-08-06 DIAGNOSIS — I10 Essential (primary) hypertension: Secondary | ICD-10-CM

## 2018-08-06 NOTE — Telephone Encounter (Signed)
Please to contact pt as he is requesting an antibiotic, ok for doxy

## 2018-08-08 ENCOUNTER — Encounter: Payer: Self-pay | Admitting: Internal Medicine

## 2018-08-08 ENCOUNTER — Ambulatory Visit (INDEPENDENT_AMBULATORY_CARE_PROVIDER_SITE_OTHER): Payer: Medicare HMO | Admitting: Internal Medicine

## 2018-08-08 DIAGNOSIS — J309 Allergic rhinitis, unspecified: Secondary | ICD-10-CM

## 2018-08-08 DIAGNOSIS — E119 Type 2 diabetes mellitus without complications: Secondary | ICD-10-CM

## 2018-08-08 DIAGNOSIS — J0140 Acute pansinusitis, unspecified: Secondary | ICD-10-CM

## 2018-08-08 MED ORDER — PREDNISONE 10 MG PO TABS: ORAL_TABLET | ORAL | 0 refills | Status: DC

## 2018-08-08 MED ORDER — TRIAMCINOLONE ACETONIDE 55 MCG/ACT NA AERO: 2.0000 | INHALATION_SPRAY | Freq: Every day | NASAL | 12 refills | Status: DC

## 2018-08-08 MED ORDER — AMOXICILLIN-POT CLAVULANATE 875-125 MG PO TABS: 1.0000 | ORAL_TABLET | Freq: Two times a day (BID) | ORAL | 0 refills | Status: DC

## 2018-08-08 MED ORDER — METHYLPREDNISOLONE ACETATE 80 MG/ML IJ SUSP
80.0000 mg | Freq: Once | INTRAMUSCULAR | Status: AC
  Administered 2018-08-08: 80 mg via INTRAMUSCULAR

## 2018-08-08 NOTE — Telephone Encounter (Signed)
Left mess for patient to call back for further clarification as to why he is requesting Zpak. Will need DOXY virtual visit if he is actually needing abx. CRM created.

## 2018-08-08 NOTE — Progress Notes (Signed)
Patient ID: Harold [name], male   DOB: 1966/03/14, 53 y.o.   MRN: [id number]  Virtual Visit via Video Note  I connected with Harold [name] on 08/08/18 at  2:40 PM EDT by a video enabled telemedicine application and verified that I am speaking with the correct person using two identifiers.  Location: Patient: at home Provider: at office   I discussed the limitations of evaluation and management by telemedicine and the availability of in person appointments. The patient expressed understanding and agreed to proceed.  History of Present Illness:  Here with 2-3 days acute onset fever, facial pain, pressure, headache, general weakness and malaise, and greenish d/c, with mild ST and cough, but pt denies chest pain, wheezing, increased sob or doe, orthopnea, PND, increased LE swelling, palpitations, dizziness or syncope.  Does have several wks ongoing nasal allergy symptoms with clearish congestion, itch and sneezing, without fever, pain, ST, cough, swelling or wheezing.   Pt denies polydipsia, polyuria.   Past Medical History:  Diagnosis Date  . Acute pericarditis    admx 03-26-10 thru 03-29-10; a. echo 03-26-10: EF 55-60%; mild LVH; trivial MR; RVF ok; mild -mod circumferential Eff w/o tamponade  . Allergic rhinitis, cause unspecified 09/14/2012  . Allergy   . Anxiety   . Chronic back pain   . DDD (degenerative disc disease), lumbar 09/14/2012  . Depression   . Diabetes mellitus without complication (HCC)    borderline   . Erectile dysfunction 09/14/2012  . GERD (gastroesophageal reflux disease)   . Gout    pt denies  . Headache    migraines in the past  . History of pancreatitis    a. admx 04-2009.Marland KitchenMarland Kitchen? 2-2 triglycerides  . Hyperlipidemia 07/11/2013  . Hypertension   . Hypertriglyceridemia    a. followed by [facility]  . Left sided sciatica 09/14/2012  . Metabolic syndrome   . Obesity   . OSA (obstructive sleep apnea)    CPAP  . Restless legs    Past Surgical History:   Procedure Laterality Date  . COLONOSCOPY    . KNEE SURGERY     right  . LUMBAR LAMINECTOMY/DECOMPRESSION MICRODISCECTOMY N/A 08/26/2015   Procedure: Lumbar Two-Sacral One  Laminectomy for decompression;  Surgeon: [name], MD;  Location: [facility];  Service: Neurosurgery;  Laterality: N/A;  . pericardectomy  07/15/2010   Dr. [name]  . pleurx catheter placement  07/15/2010   Dr. [name]  . SHOULDER SURGERY     right and left shoulders  . TONSILLECTOMY AND ADENOIDECTOMY     one tonsil    reports that he has been smoking cigarettes. He has a 10.00 pack-year smoking history. He has never used smokeless tobacco. He reports current alcohol use of about 2.0 standard drinks of alcohol per week. He reports that he does not use drugs. family history includes Colon cancer (age of onset: 36) in his father; Diabetes in his father, mother, and sister; Heart disease (age of onset: 63) in his father; Hypertension in an other family member; Liver cancer in his paternal uncle; Pancreatitis in his father and paternal uncle; Stomach cancer in his maternal grandfather. Allergies  Allergen Reactions  . Other Itching, Swelling and Palpitations    Pecans: itching and swelling of the tongue   . Diphenhydramine Itching, Palpitations and Other (See Comments)    "jittery"  . Colchicine Diarrhea  . Lipitor [Atorvastatin] Other (See Comments)    Muscle cramps  . Septra [Sulfamethoxazole-Trimethoprim] Itching   Current Outpatient  Medications on File Prior to Visit  Medication Sig Dispense Refill  . amLODipine (NORVASC) 10 MG tablet Take 1 tablet (10 mg total) by mouth daily. 90 tablet 3  . ammonium lactate (AMLACTIN) 12 % lotion Apply 1 application topically as needed for dry skin. 400 g 3  . aspirin EC 81 MG tablet Take 81 mg by mouth daily.    . Blood Glucose Monitoring Suppl (ACCU-CHEK NANO SMARTVIEW) W/DEVICE KIT Use glucose meter to check blood sugar 1-3 times daily as directed. 1 kit 0  .  cetirizine (ZYRTEC) 5 MG tablet Take 5 mg by mouth daily as needed for allergies.     . citalopram (CELEXA) 40 MG tablet TAKE ONE TABLET BY MOUTH DAILY 90 tablet 0  . fenofibrate (TRICOR) 145 MG tablet Take 1 tablet (145 mg total) by mouth daily. 90 tablet 3  . fentaNYL (DURAGESIC - DOSED MCG/HR) 50 MCG/HR Place 50 mcg onto the skin every 3 (three) days.    . fluconazole (DIFLUCAN) 100 MG tablet 1 tablet twice per day x 1day, then 1 qod x 10 days 7 tablet 0  . furosemide (LASIX) 40 MG tablet TAKE ONE TABLET BY MOUTH DAILY 90 tablet 3  . gabapentin (NEURONTIN) 300 MG capsule Take 900 mg by mouth 3 (three) times daily.    [name] glucose blood (ACCU-CHEK SMARTVIEW) test strip Use as instructed 100 each 12  . ibuprofen (ADVIL,MOTRIN) 800 MG tablet Take 800 mg by mouth every 8 (eight) hours as needed.    [name] lisinopril (PRINIVIL,ZESTRIL) 40 MG tablet Take 1 tablet (40 mg total) by mouth daily. 90 tablet 3  . metFORMIN (GLUCOPHAGE-XR) 500 MG 24 hr tablet Take 2 tablets (1,000 mg total) by mouth daily with breakfast. 180 tablet 3  . OVER THE COUNTER MEDICATION Take 500 mg by mouth daily.    [name] oxyCODONE-acetaminophen (PERCOCET) 10-325 MG tablet Take 1 tablet by mouth every 6 (six) hours as needed for pain. 90 tablet 0  . Potassium Chloride ER 20 MEQ TBCR Take 1 tablet by mouth daily. 90 tablet 3  . rosuvastatin (CRESTOR) 20 MG tablet TAKE ONE TABLET BY MOUTH DAILY. 90 tablet 0  . sildenafil (REVATIO) 20 MG tablet Take 1-5 tablets (20-100 mg total) by mouth daily as needed. 50 tablet 2  . terbinafine (LAMISIL AT) 1 % cream Apply 1 application topically daily as needed (foot fungus). 42 g 0  . terbinafine (LAMISIL) 250 MG tablet TAKE ONE TABLET BY MOUTH DAILY 90 tablet 0  . tolterodine (DETROL) 2 MG tablet Take 1 tablet (2 mg total) by mouth 2 (two) times daily. 180 tablet 3  . TRUEPLUS LANCETS 33G MISC Use to check blood sugars twice a day 300 each 1  . VOLTAREN 1 % GEL Apply 2 g topically 4 (four) times daily.  100 g 5   No current facility-administered medications on file prior to visit.     Observations/Objective: Alert, NAD, mild ill, appropriate mood and affect, resps normal, cn 2-12 intact, moves all 4s, no visible rash or swelling Lab Results  Component Value Date   WBC 13.2 (H) 02/13/2018   HGB 15.3 02/13/2018   HCT 45.3 02/13/2018   PLT 351.0 02/13/2018   GLUCOSE 159 (H) 02/13/2018   CHOL 135 02/13/2018   TRIG (H) 02/13/2018    552.0 Triglyceride is over 400; calculations on Lipids are invalid.   HDL 29.30 (L) 02/13/2018   LDLDIRECT 53.0 02/13/2018   LDLCALC 79 11/19/2013   ALT 18 02/13/2018  AST 17 02/13/2018   NA 135 02/13/2018   K 4.2 02/13/2018   CL 99 02/13/2018   CREATININE 0.96 02/13/2018   BUN 11 02/13/2018   CO2 26 02/13/2018   TSH 0.45 02/13/2018   PSA 1.29 02/13/2018   INR 1.72 (H) 07/05/2010   HGBA1C 7.3 (H) 02/13/2018   MICROALBUR 0.8 02/13/2018   Assessment and Plan: See notes  Follow Up Instructions: seenotes   I discussed the assessment and treatment plan with the patient. The patient was provided an opportunity to ask questions and all were answered. The patient agreed with the plan and demonstrated an understanding of the instructions.   The patient was advised to call back or seek an in-person evaluation if the symptoms worsen or if the condition fails to improve as anticipated.   Cathlean Cower, MD

## 2018-08-08 NOTE — Patient Instructions (Signed)
Please come to the office for your steroid shot  /Please take all new medication as prescribed - the augmentin, nasacort, and prednisone  Please continue all other medications as before, and refills have been done if requested.  Please have the pharmacy call with any other refills you may need.  Please keep your appointments with your specialists as you may have planned

## 2018-08-12 ENCOUNTER — Encounter: Payer: Self-pay | Admitting: Internal Medicine

## 2018-08-12 NOTE — Assessment & Plan Note (Signed)
Mild to mod, for depomedrol IM 80, predpac asd, nasacort asd prn,  to f/u any worsening symptoms or concerns

## 2018-08-12 NOTE — Assessment & Plan Note (Signed)
Mild to mod, for antibx course,  to f/u any worsening symptoms or concerns 

## 2018-08-12 NOTE — Assessment & Plan Note (Signed)
stable overall by history and exam, recent data reviewed with pt, and pt to continue medical treatment as before,  to f/u any worsening symptoms or concerns  

## 2018-08-20 ENCOUNTER — Encounter: Payer: Self-pay | Admitting: Internal Medicine

## 2018-08-22 MED ORDER — GLUCOSE BLOOD VI STRP: ORAL_STRIP | 12 refills | Status: AC

## 2018-08-22 MED ORDER — ONETOUCH ULTRA 2 W/DEVICE KIT: PACK | 0 refills | Status: AC

## 2018-08-22 MED ORDER — LANCETS MISC: 11 refills | Status: AC

## 2018-09-03 ENCOUNTER — Ambulatory Visit: Payer: Medicare HMO | Admitting: Internal Medicine

## 2018-09-03 ENCOUNTER — Encounter: Payer: Self-pay | Admitting: Internal Medicine

## 2018-09-20 ENCOUNTER — Other Ambulatory Visit: Payer: Self-pay | Admitting: Internal Medicine

## 2018-09-23 ENCOUNTER — Other Ambulatory Visit: Payer: Self-pay | Admitting: Internal Medicine

## 2018-09-24 NOTE — Telephone Encounter (Signed)
Opened in error

## 2018-09-26 ENCOUNTER — Other Ambulatory Visit: Payer: Self-pay | Admitting: *Deleted

## 2018-09-26 DIAGNOSIS — Z20822 Contact with and (suspected) exposure to covid-19: Secondary | ICD-10-CM

## 2018-09-30 LAB — SPECIMEN STATUS REPORT

## 2018-09-30 LAB — NOVEL CORONAVIRUS, NAA: SARS-CoV-2, NAA: NOT DETECTED

## 2018-10-03 DIAGNOSIS — M5416 Radiculopathy, lumbar region: Secondary | ICD-10-CM | POA: Diagnosis not present

## 2018-10-16 ENCOUNTER — Other Ambulatory Visit: Payer: Self-pay | Admitting: Sports Medicine

## 2018-10-16 ENCOUNTER — Encounter: Payer: Self-pay | Admitting: Internal Medicine

## 2018-10-16 ENCOUNTER — Other Ambulatory Visit: Payer: Self-pay | Admitting: Internal Medicine

## 2018-10-16 DIAGNOSIS — N529 Male erectile dysfunction, unspecified: Secondary | ICD-10-CM

## 2018-10-16 DIAGNOSIS — M549 Dorsalgia, unspecified: Secondary | ICD-10-CM

## 2018-10-16 DIAGNOSIS — B359 Dermatophytosis, unspecified: Secondary | ICD-10-CM

## 2018-10-16 DIAGNOSIS — L853 Xerosis cutis: Secondary | ICD-10-CM

## 2018-10-16 MED ORDER — TOLTERODINE TARTRATE 2 MG PO TABS: 2.0000 mg | ORAL_TABLET | Freq: Two times a day (BID) | ORAL | 1 refills | Status: DC

## 2018-10-16 MED ORDER — TERBINAFINE HCL 250 MG PO TABS: 250.0000 mg | ORAL_TABLET | Freq: Every day | ORAL | 1 refills | Status: DC

## 2018-10-28 DIAGNOSIS — F419 Anxiety disorder, unspecified: Secondary | ICD-10-CM | POA: Diagnosis not present

## 2018-10-28 DIAGNOSIS — M1710 Unilateral primary osteoarthritis, unspecified knee: Secondary | ICD-10-CM | POA: Diagnosis not present

## 2018-10-28 DIAGNOSIS — M109 Gout, unspecified: Secondary | ICD-10-CM | POA: Diagnosis not present

## 2018-10-28 DIAGNOSIS — G894 Chronic pain syndrome: Secondary | ICD-10-CM | POA: Diagnosis not present

## 2018-10-28 DIAGNOSIS — F329 Major depressive disorder, single episode, unspecified: Secondary | ICD-10-CM | POA: Diagnosis not present

## 2018-10-28 DIAGNOSIS — I110 Hypertensive heart disease with heart failure: Secondary | ICD-10-CM | POA: Diagnosis not present

## 2018-10-28 DIAGNOSIS — E119 Type 2 diabetes mellitus without complications: Secondary | ICD-10-CM | POA: Diagnosis not present

## 2018-10-28 DIAGNOSIS — M5116 Intervertebral disc disorders with radiculopathy, lumbar region: Secondary | ICD-10-CM | POA: Diagnosis not present

## 2018-10-28 DIAGNOSIS — I509 Heart failure, unspecified: Secondary | ICD-10-CM | POA: Diagnosis not present

## 2018-11-02 ENCOUNTER — Telehealth: Payer: Self-pay | Admitting: Internal Medicine

## 2018-11-02 DIAGNOSIS — R269 Unspecified abnormalities of gait and mobility: Secondary | ICD-10-CM

## 2018-11-02 DIAGNOSIS — F419 Anxiety disorder, unspecified: Secondary | ICD-10-CM | POA: Diagnosis not present

## 2018-11-02 DIAGNOSIS — I110 Hypertensive heart disease with heart failure: Secondary | ICD-10-CM | POA: Diagnosis not present

## 2018-11-02 DIAGNOSIS — F329 Major depressive disorder, single episode, unspecified: Secondary | ICD-10-CM | POA: Diagnosis not present

## 2018-11-02 DIAGNOSIS — E119 Type 2 diabetes mellitus without complications: Secondary | ICD-10-CM | POA: Diagnosis not present

## 2018-11-02 DIAGNOSIS — M5116 Intervertebral disc disorders with radiculopathy, lumbar region: Secondary | ICD-10-CM | POA: Diagnosis not present

## 2018-11-02 DIAGNOSIS — M109 Gout, unspecified: Secondary | ICD-10-CM | POA: Diagnosis not present

## 2018-11-02 DIAGNOSIS — G894 Chronic pain syndrome: Secondary | ICD-10-CM | POA: Diagnosis not present

## 2018-11-02 DIAGNOSIS — M1710 Unilateral primary osteoarthritis, unspecified knee: Secondary | ICD-10-CM | POA: Diagnosis not present

## 2018-11-02 DIAGNOSIS — I509 Heart failure, unspecified: Secondary | ICD-10-CM | POA: Diagnosis not present

## 2018-11-02 NOTE — Telephone Encounter (Signed)
Harold [name], from [facility], called and is requesting a prescription for a shower chair be sent in. She states he has no bar in the shower and that he is very unbalanced. She is requesting it be sent into [facility] Fax [phone]. Please advise.

## 2018-11-02 NOTE — Telephone Encounter (Signed)
Done hardcopy to shirron 

## 2018-11-05 NOTE — Telephone Encounter (Signed)
Faxed

## 2018-11-06 DIAGNOSIS — Z7984 Long term (current) use of oral hypoglycemic drugs: Secondary | ICD-10-CM

## 2018-11-06 DIAGNOSIS — F17210 Nicotine dependence, cigarettes, uncomplicated: Secondary | ICD-10-CM

## 2018-11-06 DIAGNOSIS — M5116 Intervertebral disc disorders with radiculopathy, lumbar region: Secondary | ICD-10-CM | POA: Diagnosis not present

## 2018-11-06 DIAGNOSIS — G4733 Obstructive sleep apnea (adult) (pediatric): Secondary | ICD-10-CM

## 2018-11-06 DIAGNOSIS — G894 Chronic pain syndrome: Secondary | ICD-10-CM | POA: Diagnosis not present

## 2018-11-06 DIAGNOSIS — E119 Type 2 diabetes mellitus without complications: Secondary | ICD-10-CM

## 2018-11-06 DIAGNOSIS — M5416 Radiculopathy, lumbar region: Secondary | ICD-10-CM | POA: Diagnosis not present

## 2018-11-06 DIAGNOSIS — Z7982 Long term (current) use of aspirin: Secondary | ICD-10-CM

## 2018-11-06 DIAGNOSIS — I110 Hypertensive heart disease with heart failure: Secondary | ICD-10-CM | POA: Diagnosis not present

## 2018-11-06 DIAGNOSIS — Z79891 Long term (current) use of opiate analgesic: Secondary | ICD-10-CM

## 2018-11-06 DIAGNOSIS — K219 Gastro-esophageal reflux disease without esophagitis: Secondary | ICD-10-CM

## 2018-11-06 DIAGNOSIS — M1710 Unilateral primary osteoarthritis, unspecified knee: Secondary | ICD-10-CM | POA: Diagnosis not present

## 2018-11-06 DIAGNOSIS — L84 Corns and callosities: Secondary | ICD-10-CM

## 2018-11-06 DIAGNOSIS — F419 Anxiety disorder, unspecified: Secondary | ICD-10-CM | POA: Diagnosis not present

## 2018-11-06 DIAGNOSIS — I509 Heart failure, unspecified: Secondary | ICD-10-CM | POA: Diagnosis not present

## 2018-11-06 DIAGNOSIS — F329 Major depressive disorder, single episode, unspecified: Secondary | ICD-10-CM | POA: Diagnosis not present

## 2018-11-06 DIAGNOSIS — M109 Gout, unspecified: Secondary | ICD-10-CM | POA: Diagnosis not present

## 2018-11-06 DIAGNOSIS — J309 Allergic rhinitis, unspecified: Secondary | ICD-10-CM

## 2018-11-07 DIAGNOSIS — M5116 Intervertebral disc disorders with radiculopathy, lumbar region: Secondary | ICD-10-CM | POA: Diagnosis not present

## 2018-11-07 DIAGNOSIS — M109 Gout, unspecified: Secondary | ICD-10-CM | POA: Diagnosis not present

## 2018-11-07 DIAGNOSIS — M1710 Unilateral primary osteoarthritis, unspecified knee: Secondary | ICD-10-CM | POA: Diagnosis not present

## 2018-11-07 DIAGNOSIS — I509 Heart failure, unspecified: Secondary | ICD-10-CM | POA: Diagnosis not present

## 2018-11-07 DIAGNOSIS — E119 Type 2 diabetes mellitus without complications: Secondary | ICD-10-CM | POA: Diagnosis not present

## 2018-11-07 DIAGNOSIS — F419 Anxiety disorder, unspecified: Secondary | ICD-10-CM | POA: Diagnosis not present

## 2018-11-07 DIAGNOSIS — I110 Hypertensive heart disease with heart failure: Secondary | ICD-10-CM | POA: Diagnosis not present

## 2018-11-07 DIAGNOSIS — G894 Chronic pain syndrome: Secondary | ICD-10-CM | POA: Diagnosis not present

## 2018-11-07 DIAGNOSIS — F329 Major depressive disorder, single episode, unspecified: Secondary | ICD-10-CM | POA: Diagnosis not present

## 2018-11-08 DIAGNOSIS — F419 Anxiety disorder, unspecified: Secondary | ICD-10-CM | POA: Diagnosis not present

## 2018-11-08 DIAGNOSIS — F329 Major depressive disorder, single episode, unspecified: Secondary | ICD-10-CM | POA: Diagnosis not present

## 2018-11-08 DIAGNOSIS — M1710 Unilateral primary osteoarthritis, unspecified knee: Secondary | ICD-10-CM | POA: Diagnosis not present

## 2018-11-08 DIAGNOSIS — I509 Heart failure, unspecified: Secondary | ICD-10-CM | POA: Diagnosis not present

## 2018-11-08 DIAGNOSIS — M109 Gout, unspecified: Secondary | ICD-10-CM | POA: Diagnosis not present

## 2018-11-08 DIAGNOSIS — M5116 Intervertebral disc disorders with radiculopathy, lumbar region: Secondary | ICD-10-CM | POA: Diagnosis not present

## 2018-11-08 DIAGNOSIS — E119 Type 2 diabetes mellitus without complications: Secondary | ICD-10-CM | POA: Diagnosis not present

## 2018-11-08 DIAGNOSIS — I110 Hypertensive heart disease with heart failure: Secondary | ICD-10-CM | POA: Diagnosis not present

## 2018-11-08 DIAGNOSIS — G894 Chronic pain syndrome: Secondary | ICD-10-CM | POA: Diagnosis not present

## 2018-11-12 ENCOUNTER — Other Ambulatory Visit: Payer: Self-pay | Admitting: Internal Medicine

## 2018-11-13 DIAGNOSIS — M109 Gout, unspecified: Secondary | ICD-10-CM | POA: Diagnosis not present

## 2018-11-13 DIAGNOSIS — G894 Chronic pain syndrome: Secondary | ICD-10-CM | POA: Diagnosis not present

## 2018-11-13 DIAGNOSIS — I509 Heart failure, unspecified: Secondary | ICD-10-CM | POA: Diagnosis not present

## 2018-11-13 DIAGNOSIS — M1710 Unilateral primary osteoarthritis, unspecified knee: Secondary | ICD-10-CM | POA: Diagnosis not present

## 2018-11-13 DIAGNOSIS — I110 Hypertensive heart disease with heart failure: Secondary | ICD-10-CM | POA: Diagnosis not present

## 2018-11-13 DIAGNOSIS — F329 Major depressive disorder, single episode, unspecified: Secondary | ICD-10-CM | POA: Diagnosis not present

## 2018-11-13 DIAGNOSIS — M5116 Intervertebral disc disorders with radiculopathy, lumbar region: Secondary | ICD-10-CM | POA: Diagnosis not present

## 2018-11-13 DIAGNOSIS — E119 Type 2 diabetes mellitus without complications: Secondary | ICD-10-CM | POA: Diagnosis not present

## 2018-11-13 DIAGNOSIS — F419 Anxiety disorder, unspecified: Secondary | ICD-10-CM | POA: Diagnosis not present

## 2018-11-16 DIAGNOSIS — M1710 Unilateral primary osteoarthritis, unspecified knee: Secondary | ICD-10-CM | POA: Diagnosis not present

## 2018-11-16 DIAGNOSIS — F419 Anxiety disorder, unspecified: Secondary | ICD-10-CM | POA: Diagnosis not present

## 2018-11-16 DIAGNOSIS — M109 Gout, unspecified: Secondary | ICD-10-CM | POA: Diagnosis not present

## 2018-11-16 DIAGNOSIS — F329 Major depressive disorder, single episode, unspecified: Secondary | ICD-10-CM | POA: Diagnosis not present

## 2018-11-16 DIAGNOSIS — G894 Chronic pain syndrome: Secondary | ICD-10-CM | POA: Diagnosis not present

## 2018-11-16 DIAGNOSIS — M5116 Intervertebral disc disorders with radiculopathy, lumbar region: Secondary | ICD-10-CM | POA: Diagnosis not present

## 2018-11-16 DIAGNOSIS — I110 Hypertensive heart disease with heart failure: Secondary | ICD-10-CM | POA: Diagnosis not present

## 2018-11-16 DIAGNOSIS — I509 Heart failure, unspecified: Secondary | ICD-10-CM | POA: Diagnosis not present

## 2018-11-16 DIAGNOSIS — E119 Type 2 diabetes mellitus without complications: Secondary | ICD-10-CM | POA: Diagnosis not present

## 2018-11-19 ENCOUNTER — Other Ambulatory Visit: Payer: Self-pay | Admitting: Internal Medicine

## 2018-11-19 DIAGNOSIS — N529 Male erectile dysfunction, unspecified: Secondary | ICD-10-CM

## 2018-11-20 DIAGNOSIS — I110 Hypertensive heart disease with heart failure: Secondary | ICD-10-CM | POA: Diagnosis not present

## 2018-11-20 DIAGNOSIS — E119 Type 2 diabetes mellitus without complications: Secondary | ICD-10-CM | POA: Diagnosis not present

## 2018-11-20 DIAGNOSIS — G894 Chronic pain syndrome: Secondary | ICD-10-CM | POA: Diagnosis not present

## 2018-11-20 DIAGNOSIS — F419 Anxiety disorder, unspecified: Secondary | ICD-10-CM | POA: Diagnosis not present

## 2018-11-20 DIAGNOSIS — M5116 Intervertebral disc disorders with radiculopathy, lumbar region: Secondary | ICD-10-CM | POA: Diagnosis not present

## 2018-11-20 DIAGNOSIS — I509 Heart failure, unspecified: Secondary | ICD-10-CM | POA: Diagnosis not present

## 2018-11-20 DIAGNOSIS — M1710 Unilateral primary osteoarthritis, unspecified knee: Secondary | ICD-10-CM | POA: Diagnosis not present

## 2018-11-20 DIAGNOSIS — M109 Gout, unspecified: Secondary | ICD-10-CM | POA: Diagnosis not present

## 2018-11-20 DIAGNOSIS — F329 Major depressive disorder, single episode, unspecified: Secondary | ICD-10-CM | POA: Diagnosis not present

## 2018-11-21 ENCOUNTER — Encounter: Payer: Self-pay | Admitting: Internal Medicine

## 2018-11-21 DIAGNOSIS — F419 Anxiety disorder, unspecified: Secondary | ICD-10-CM | POA: Diagnosis not present

## 2018-11-21 DIAGNOSIS — F329 Major depressive disorder, single episode, unspecified: Secondary | ICD-10-CM | POA: Diagnosis not present

## 2018-11-21 DIAGNOSIS — E119 Type 2 diabetes mellitus without complications: Secondary | ICD-10-CM | POA: Diagnosis not present

## 2018-11-21 DIAGNOSIS — I110 Hypertensive heart disease with heart failure: Secondary | ICD-10-CM | POA: Diagnosis not present

## 2018-11-21 DIAGNOSIS — M1710 Unilateral primary osteoarthritis, unspecified knee: Secondary | ICD-10-CM | POA: Diagnosis not present

## 2018-11-21 DIAGNOSIS — M5116 Intervertebral disc disorders with radiculopathy, lumbar region: Secondary | ICD-10-CM | POA: Diagnosis not present

## 2018-11-21 DIAGNOSIS — I509 Heart failure, unspecified: Secondary | ICD-10-CM | POA: Diagnosis not present

## 2018-11-21 DIAGNOSIS — M109 Gout, unspecified: Secondary | ICD-10-CM | POA: Diagnosis not present

## 2018-11-21 DIAGNOSIS — R269 Unspecified abnormalities of gait and mobility: Secondary | ICD-10-CM

## 2018-11-21 DIAGNOSIS — G894 Chronic pain syndrome: Secondary | ICD-10-CM | POA: Diagnosis not present

## 2018-12-11 ENCOUNTER — Other Ambulatory Visit: Payer: Self-pay | Admitting: Internal Medicine

## 2018-12-11 DIAGNOSIS — N529 Male erectile dysfunction, unspecified: Secondary | ICD-10-CM

## 2018-12-25 ENCOUNTER — Ambulatory Visit: Payer: Medicare HMO | Attending: Physical Therapy | Admitting: Physical Therapy

## 2019-01-02 ENCOUNTER — Encounter: Payer: Self-pay | Admitting: Family

## 2019-01-02 ENCOUNTER — Ambulatory Visit: Payer: Medicare HMO | Admitting: Family

## 2019-01-07 ENCOUNTER — Encounter: Payer: Self-pay | Admitting: Family

## 2019-01-07 ENCOUNTER — Encounter: Payer: Self-pay | Admitting: Internal Medicine

## 2019-01-07 ENCOUNTER — Ambulatory Visit (INDEPENDENT_AMBULATORY_CARE_PROVIDER_SITE_OTHER): Payer: Medicare HMO | Admitting: Family

## 2019-01-07 ENCOUNTER — Other Ambulatory Visit: Payer: Self-pay | Admitting: Internal Medicine

## 2019-01-07 ENCOUNTER — Ambulatory Visit (INDEPENDENT_AMBULATORY_CARE_PROVIDER_SITE_OTHER)
Admission: RE | Admit: 2019-01-07 | Discharge: 2019-01-07 | Disposition: A | Discharge status: Home / Self Care | Payer: Medicare HMO | Source: Ambulatory Visit | Attending: Family | Admitting: Family

## 2019-01-07 ENCOUNTER — Other Ambulatory Visit: Payer: Self-pay

## 2019-01-07 VITALS — BP 140/90 | HR 67 | Temp 99.2°F | Ht 70.0 in | Wt 260.8 lb

## 2019-01-07 DIAGNOSIS — M25512 Pain in left shoulder: Secondary | ICD-10-CM

## 2019-01-07 DIAGNOSIS — M542 Cervicalgia: Secondary | ICD-10-CM | POA: Diagnosis not present

## 2019-01-07 DIAGNOSIS — Z23 Encounter for immunization: Secondary | ICD-10-CM

## 2019-01-07 DIAGNOSIS — S199XXA Unspecified injury of neck, initial encounter: Secondary | ICD-10-CM | POA: Diagnosis not present

## 2019-01-07 DIAGNOSIS — N529 Male erectile dysfunction, unspecified: Secondary | ICD-10-CM

## 2019-01-07 DIAGNOSIS — M19012 Primary osteoarthritis, left shoulder: Secondary | ICD-10-CM | POA: Diagnosis not present

## 2019-01-07 NOTE — Addendum Note (Signed)
Addended by: Marcina Millard on: 01/07/2019 03:01 PM   Modules accepted: Orders
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
## 2019-01-09 MED ORDER — SILDENAFIL CITRATE 20 MG PO TABS: ORAL_TABLET | ORAL | 3 refills | Status: DC

## 2019-01-21 ENCOUNTER — Ambulatory Visit: Payer: Medicare HMO | Admitting: Internal Medicine

## 2019-02-07 DIAGNOSIS — M25561 Pain in right knee: Secondary | ICD-10-CM | POA: Diagnosis not present

## 2019-02-07 DIAGNOSIS — M5416 Radiculopathy, lumbar region: Secondary | ICD-10-CM | POA: Diagnosis not present

## 2019-02-12 ENCOUNTER — Ambulatory Visit (INDEPENDENT_AMBULATORY_CARE_PROVIDER_SITE_OTHER): Payer: Medicare HMO | Admitting: Internal Medicine

## 2019-02-12 ENCOUNTER — Encounter: Payer: Self-pay | Admitting: Internal Medicine

## 2019-02-12 DIAGNOSIS — Z20822 Contact with and (suspected) exposure to covid-19: Secondary | ICD-10-CM

## 2019-02-12 DIAGNOSIS — J329 Chronic sinusitis, unspecified: Secondary | ICD-10-CM

## 2019-02-12 DIAGNOSIS — J309 Allergic rhinitis, unspecified: Secondary | ICD-10-CM

## 2019-02-12 DIAGNOSIS — N529 Male erectile dysfunction, unspecified: Secondary | ICD-10-CM | POA: Diagnosis not present

## 2019-02-12 DIAGNOSIS — E119 Type 2 diabetes mellitus without complications: Secondary | ICD-10-CM | POA: Diagnosis not present

## 2019-02-12 DIAGNOSIS — Z20828 Contact with and (suspected) exposure to other viral communicable diseases: Secondary | ICD-10-CM | POA: Diagnosis not present

## 2019-02-12 MED ORDER — SILDENAFIL CITRATE 20 MG PO TABS: ORAL_TABLET | ORAL | 9 refills | Status: DC

## 2019-02-12 MED ORDER — LEVOFLOXACIN 500 MG PO TABS: 500.0000 mg | ORAL_TABLET | Freq: Every day | ORAL | 0 refills | Status: AC

## 2019-02-12 MED ORDER — PREDNISONE 10 MG PO TABS: ORAL_TABLET | ORAL | 0 refills | Status: DC

## 2019-02-12 MED ORDER — HYDROCODONE-HOMATROPINE 5-1.5 MG/5ML PO SYRP: 5.0000 mL | ORAL_SOLUTION | Freq: Four times a day (QID) | ORAL | 0 refills | Status: AC | PRN

## 2019-02-12 NOTE — Telephone Encounter (Signed)
Staff to please assist pt with doxy today -   We have 3pm, 320, and 420 pm appts it appears

## 2019-02-12 NOTE — Telephone Encounter (Signed)
Appointment has been made for today.  °

## 2019-02-17 ENCOUNTER — Encounter: Payer: Self-pay | Admitting: Internal Medicine

## 2019-02-17 DIAGNOSIS — J329 Chronic sinusitis, unspecified: Secondary | ICD-10-CM | POA: Insufficient documentation

## 2019-02-17 NOTE — Patient Instructions (Signed)
Please take all new medication as prescribed  Please continue all other medications as before, and refills have been done if requested.  Please have the pharmacy call with any other refills you may need.  Please continue your efforts at being more active, low cholesterol diet, and weight control.  Please keep your appointments with your specialists as you may have planned    

## 2019-02-17 NOTE — Assessment & Plan Note (Signed)
stable overall by history and exam, recent data reviewed with pt, and pt to continue medical treatment as before,  to f/u any worsening symptoms or concerns  

## 2019-02-17 NOTE — Progress Notes (Signed)
Patient ID: Harold [name], male   DOB: Mar 19, 1966, 53 y.o.   MRN: [id number]  Virtual Visit via Video Note  I connected with Harold [name] on 02/17/19 at  3:20 PM EST by a video enabled telemedicine application and verified that I am speaking with the correct person using two identifiers.  Location: Patient: at home Provider: at office   I discussed the limitations of evaluation and management by telemedicine and the availability of in person appointments. The patient expressed understanding and agreed to proceed.  History of Present Illness:  Here with 2-3 days acute onset fever, facial pain, pressure, headache, general weakness and malaise, and greenish d/c, with mild ST and cough, but pt denies chest pain, wheezing, increased sob or doe, orthopnea, PND, increased LE swelling, palpitations, dizziness or syncope.  Does have several wks ongoing nasal allergy symptoms with clearish congestion, itch and sneezing, without fever, pain, ST, cough, swelling or wheezing.   Pt denies polydipsia, polyuria.    Also with worsening ED symptoms x 6 mo, asks for tx, cannot maintain to completion Past Medical History:  Diagnosis Date  . Acute pericarditis    admx 03-26-10 thru 03-29-10; a. echo 03-26-10: EF 55-60%; mild LVH; trivial MR; RVF ok; mild -mod circumferential Eff w/o tamponade  . Allergic rhinitis, cause unspecified 09/14/2012  . Allergy   . Anxiety   . Chronic back pain   . DDD (degenerative disc disease), lumbar 09/14/2012  . Depression   . Diabetes mellitus without complication (HCC)    borderline   . Erectile dysfunction 09/14/2012  . GERD (gastroesophageal reflux disease)   . Gout    pt denies  . Headache    migraines in the past  . History of pancreatitis    a. admx 04-2009.Marland KitchenMarland Kitchen? 2-2 triglycerides  . Hyperlipidemia 07/11/2013  . Hypertension   . Hypertriglyceridemia    a. followed by [facility]  . Left sided sciatica 09/14/2012  . Metabolic syndrome   . Obesity   . OSA  (obstructive sleep apnea)    CPAP  . Restless legs    Past Surgical History:  Procedure Laterality Date  . COLONOSCOPY    . KNEE SURGERY     right  . LUMBAR LAMINECTOMY/DECOMPRESSION MICRODISCECTOMY N/A 08/26/2015   Procedure: Lumbar Two-Sacral One  Laminectomy for decompression;  Surgeon: [name], MD;  Location: [place] [facility];  Service: Neurosurgery;  Laterality: N/A;  . pericardectomy  07/15/2010   Dr. [name]  . pleurx catheter placement  07/15/2010   Dr. [name]  . SHOULDER SURGERY     right and left shoulders  . TONSILLECTOMY AND ADENOIDECTOMY     one tonsil    reports that he has been smoking cigarettes. He has a 10.00 pack-year smoking history. He has never used smokeless tobacco. He reports current alcohol use of about 2.0 standard drinks of alcohol per week. He reports that he does not use drugs. family history includes Colon cancer (age of onset: 11) in his father; Diabetes in his father, mother, and sister; Heart disease (age of onset: 50) in his father; Hypertension in an other family member; Liver cancer in his paternal uncle; Pancreatitis in his father and paternal uncle; Stomach cancer in his maternal grandfather. Allergies  Allergen Reactions  . Other Itching, Swelling and Palpitations    Pecans: itching and swelling of the tongue   . Diphenhydramine Itching, Palpitations and Other (See Comments)    "jittery"  . Colchicine Diarrhea  . Lipitor [Atorvastatin] Other (  See Comments)    Muscle cramps  . Septra [Sulfamethoxazole-Trimethoprim] Itching   Current Outpatient Medications on File Prior to Visit  Medication Sig Dispense Refill  . amLODipine (NORVASC) 10 MG tablet Take 1 tablet (10 mg total) by mouth daily. 90 tablet 3  . ammonium lactate (AMLACTIN) 12 % lotion Apply 1 application topically as needed for dry skin. 400 g 3  . amoxicillin-clavulanate (AUGMENTIN) 875-125 MG tablet Take 1 tablet by mouth 2 (two) times daily. 28 tablet 0  . aspirin EC 81  MG tablet Take 81 mg by mouth daily.    . Blood Glucose Monitoring Suppl (ONE TOUCH ULTRA 2) w/Device KIT Use as directed daily E11.9 1 each 0  . cetirizine (ZYRTEC) 5 MG tablet Take 5 mg by mouth daily as needed for allergies.     . citalopram (CELEXA) 40 MG tablet TAKE ONE TABLET BY MOUTH DAILY 90 tablet 0  . fenofibrate (TRICOR) 145 MG tablet Take 1 tablet (145 mg total) by mouth daily. 90 tablet 3  . fentaNYL (DURAGESIC - DOSED MCG/HR) 50 MCG/HR Place 50 mcg onto the skin every 3 (three) days.    . fluconazole (DIFLUCAN) 100 MG tablet 1 tablet twice per day x 1day, then 1 qod x 10 days 7 tablet 0  . furosemide (LASIX) 40 MG tablet TAKE ONE TABLET BY MOUTH DAILY 90 tablet 3  . gabapentin (NEURONTIN) 300 MG capsule Take 900 mg by mouth 3 (three) times daily.    [name] glucose blood test strip Use as instructed once daily E119 100 each 12  . ibuprofen (ADVIL,MOTRIN) 800 MG tablet Take 800 mg by mouth every 8 (eight) hours as needed.    . Lancets MISC Use as directed daily E11.9 100 each 11  . lisinopril (PRINIVIL,ZESTRIL) 40 MG tablet Take 1 tablet (40 mg total) by mouth daily. 90 tablet 3  . lisinopril (ZESTRIL) 20 MG tablet TAKE ONE TABLET BY MOUTH TWICE A DAY 180 tablet 0  . metFORMIN (GLUCOPHAGE-XR) 500 MG 24 hr tablet Take 2 tablets (1,000 mg total) by mouth daily with breakfast. 180 tablet 3  . OVER THE COUNTER MEDICATION Take 500 mg by mouth daily.    [name] oxyCODONE-acetaminophen (PERCOCET) 10-325 MG tablet Take 1 tablet by mouth every 6 (six) hours as needed for pain. 90 tablet 0  . Potassium Chloride ER 20 MEQ TBCR Take 1 tablet by mouth daily. 90 tablet 3  . rosuvastatin (CRESTOR) 20 MG tablet TAKE ONE TABLET BY MOUTH DAILY 90 tablet 1  . terbinafine (LAMISIL AT) 1 % cream Apply 1 application topically daily as needed (foot fungus). 42 g 0  . terbinafine (LAMISIL) 250 MG tablet Take 1 tablet (250 mg total) by mouth daily. 90 tablet 1  . tolterodine (DETROL) 2 MG tablet Take 1 tablet (2 mg  total) by mouth 2 (two) times daily. 180 tablet 1  . triamcinolone (NASACORT) 55 MCG/ACT AERO nasal inhaler Place 2 sprays into the nose daily. 1 Inhaler 12  . VOLTAREN 1 % GEL Apply 2 g topically 4 (four) times daily. 100 g 5   No current facility-administered medications on file prior to visit.     Observations/Objective: Alert, NAD, appropriate mood and affect, resps normal, cn 2-12 intact, moves all 4s, no visible rash or swelling Lab Results  Component Value Date   WBC 13.2 (H) 02/13/2018   HGB 15.3 02/13/2018   HCT 45.3 02/13/2018   PLT 351.0 02/13/2018   GLUCOSE 159 (H) 02/13/2018   CHOL  135 02/13/2018   TRIG (H) 02/13/2018    552.0 Triglyceride is over 400; calculations on Lipids are invalid.   HDL 29.30 (L) 02/13/2018   LDLDIRECT 53.0 02/13/2018   LDLCALC 79 11/19/2013   ALT 18 02/13/2018   AST 17 02/13/2018   NA 135 02/13/2018   K 4.2 02/13/2018   CL 99 02/13/2018   CREATININE 0.96 02/13/2018   BUN 11 02/13/2018   CO2 26 02/13/2018   TSH 0.45 02/13/2018   PSA 1.29 02/13/2018   INR 1.72 (H) 07/05/2010   HGBA1C 7.3 (H) 02/13/2018   MICROALBUR 0.8 02/13/2018   Assessment and Plan: See notes  Follow Up Instructions: See notes   I discussed the assessment and treatment plan with the patient. The patient was provided an opportunity to ask questions and all were answered. The patient agreed with the plan and demonstrated an understanding of the instructions.   The patient was advised to call back or seek an in-person evaluation if the symptoms worsen or if the condition fails to improve as anticipated   Cathlean Cower, MD

## 2019-02-17 NOTE — Assessment & Plan Note (Signed)
/  Mild to mod, for predpac asd,  to f/u any worsening symptoms or concerns 

## 2019-02-17 NOTE — Assessment & Plan Note (Signed)
[place] for pde5 med prn,  to f/u any worsening symptoms or concerns

## 2019-02-17 NOTE — Assessment & Plan Note (Signed)
Mild to mod, for antibx course,  to f/u any worsening symptoms or concerns 

## 2019-02-20 ENCOUNTER — Other Ambulatory Visit: Payer: Self-pay | Admitting: Internal Medicine

## 2019-02-24 ENCOUNTER — Other Ambulatory Visit: Payer: Self-pay | Admitting: Internal Medicine

## 2019-03-01 ENCOUNTER — Encounter: Payer: Self-pay | Admitting: Internal Medicine

## 2019-03-06 DIAGNOSIS — M25561 Pain in right knee: Secondary | ICD-10-CM | POA: Diagnosis not present

## 2019-03-08 ENCOUNTER — Other Ambulatory Visit: Payer: Self-pay | Admitting: Internal Medicine

## 2019-03-15 ENCOUNTER — Ambulatory Visit: Payer: Medicare HMO | Attending: Internal Medicine

## 2019-03-15 DIAGNOSIS — Z20828 Contact with and (suspected) exposure to other viral communicable diseases: Secondary | ICD-10-CM | POA: Diagnosis not present

## 2019-03-15 DIAGNOSIS — Z20822 Contact with and (suspected) exposure to covid-19: Secondary | ICD-10-CM

## 2019-03-16 LAB — NOVEL CORONAVIRUS, NAA: SARS-CoV-2, NAA: NOT DETECTED

## 2019-03-18 ENCOUNTER — Encounter: Payer: Self-pay | Admitting: Internal Medicine

## 2019-03-18 MED ORDER — AMOXICILLIN-POT CLAVULANATE 875-125 MG PO TABS: 1.0000 | ORAL_TABLET | Freq: Two times a day (BID) | ORAL | 0 refills | Status: DC

## 2019-03-27 ENCOUNTER — Other Ambulatory Visit: Payer: Self-pay | Admitting: Internal Medicine

## 2019-03-27 DIAGNOSIS — I10 Essential (primary) hypertension: Secondary | ICD-10-CM

## 2019-03-27 NOTE — Telephone Encounter (Signed)
Routine refill.

## 2019-03-28 NOTE — Telephone Encounter (Signed)
As per routine refill policy - I have not reviewed the details

## 2019-03-29 HISTORY — PX: BACK SURGERY: SHX140

## 2019-04-01 ENCOUNTER — Other Ambulatory Visit: Payer: Self-pay | Admitting: Internal Medicine

## 2019-04-01 NOTE — Telephone Encounter (Signed)
Please refill as per office routine med refill policy (all routine meds refilled for 3 mo or monthly per pt preference up to one year from last visit, then month to month grace period for 3 mo, then further med refills will have to be denied)  

## 2019-04-03 ENCOUNTER — Other Ambulatory Visit: Payer: Self-pay | Admitting: Internal Medicine

## 2019-04-03 NOTE — Telephone Encounter (Signed)
Please refill as per office routine med refill policy (all routine meds refilled for 3 mo or monthly per pt preference up to one year from last visit, then month to month grace period for 3 mo, then further med refills will have to be denied)  

## 2019-04-04 ENCOUNTER — Encounter: Payer: Self-pay | Admitting: Internal Medicine

## 2019-04-04 DIAGNOSIS — E119 Type 2 diabetes mellitus without complications: Secondary | ICD-10-CM

## 2019-04-09 ENCOUNTER — Ambulatory Visit: Payer: Medicare HMO | Admitting: Sports Medicine

## 2019-04-10 DIAGNOSIS — M961 Postlaminectomy syndrome, not elsewhere classified: Secondary | ICD-10-CM | POA: Diagnosis not present

## 2019-04-10 DIAGNOSIS — M48061 Spinal stenosis, lumbar region without neurogenic claudication: Secondary | ICD-10-CM | POA: Diagnosis not present

## 2019-04-14 ENCOUNTER — Other Ambulatory Visit: Payer: Self-pay | Admitting: Internal Medicine

## 2019-04-14 ENCOUNTER — Encounter: Payer: Self-pay | Admitting: Internal Medicine

## 2019-04-15 DIAGNOSIS — Z7984 Long term (current) use of oral hypoglycemic drugs: Secondary | ICD-10-CM | POA: Diagnosis not present

## 2019-04-15 DIAGNOSIS — H524 Presbyopia: Secondary | ICD-10-CM | POA: Diagnosis not present

## 2019-04-15 DIAGNOSIS — H52203 Unspecified astigmatism, bilateral: Secondary | ICD-10-CM | POA: Diagnosis not present

## 2019-04-15 DIAGNOSIS — I10 Essential (primary) hypertension: Secondary | ICD-10-CM | POA: Diagnosis not present

## 2019-04-15 DIAGNOSIS — H52223 Regular astigmatism, bilateral: Secondary | ICD-10-CM | POA: Diagnosis not present

## 2019-04-15 DIAGNOSIS — H5203 Hypermetropia, bilateral: Secondary | ICD-10-CM | POA: Diagnosis not present

## 2019-04-15 DIAGNOSIS — E119 Type 2 diabetes mellitus without complications: Secondary | ICD-10-CM | POA: Diagnosis not present

## 2019-04-15 DIAGNOSIS — H527 Unspecified disorder of refraction: Secondary | ICD-10-CM | POA: Diagnosis not present

## 2019-04-29 ENCOUNTER — Encounter: Payer: Self-pay | Admitting: Internal Medicine

## 2019-04-29 DIAGNOSIS — M1711 Unilateral primary osteoarthritis, right knee: Secondary | ICD-10-CM | POA: Diagnosis not present

## 2019-04-29 DIAGNOSIS — M79604 Pain in right leg: Secondary | ICD-10-CM

## 2019-04-29 DIAGNOSIS — M25461 Effusion, right knee: Secondary | ICD-10-CM | POA: Diagnosis not present

## 2019-04-29 DIAGNOSIS — S8391XA Sprain of unspecified site of right knee, initial encounter: Secondary | ICD-10-CM | POA: Diagnosis not present

## 2019-05-09 DIAGNOSIS — M48061 Spinal stenosis, lumbar region without neurogenic claudication: Secondary | ICD-10-CM | POA: Diagnosis not present

## 2019-05-25 ENCOUNTER — Other Ambulatory Visit: Payer: Self-pay | Admitting: Internal Medicine

## 2019-06-02 ENCOUNTER — Encounter: Payer: Self-pay | Admitting: Internal Medicine

## 2019-06-03 NOTE — Telephone Encounter (Signed)
Staff to assist pt with ROV for DM

## 2019-06-05 DIAGNOSIS — M48061 Spinal stenosis, lumbar region without neurogenic claudication: Secondary | ICD-10-CM | POA: Diagnosis not present

## 2019-06-05 DIAGNOSIS — M25561 Pain in right knee: Secondary | ICD-10-CM | POA: Diagnosis not present

## 2019-06-12 ENCOUNTER — Ambulatory Visit: Payer: Medicare HMO | Admitting: Podiatry

## 2019-06-12 ENCOUNTER — Other Ambulatory Visit: Payer: Self-pay

## 2019-06-12 ENCOUNTER — Other Ambulatory Visit: Payer: Self-pay | Admitting: Podiatry

## 2019-06-12 DIAGNOSIS — L97512 Non-pressure chronic ulcer of other part of right foot with fat layer exposed: Secondary | ICD-10-CM | POA: Diagnosis not present

## 2019-06-12 DIAGNOSIS — E0843 Diabetes mellitus due to underlying condition with diabetic autonomic (poly)neuropathy: Secondary | ICD-10-CM

## 2019-06-12 MED ORDER — DOXYCYCLINE HYCLATE 100 MG PO TABS: 100.0000 mg | ORAL_TABLET | Freq: Two times a day (BID) | ORAL | 0 refills | Status: DC

## 2019-06-13 ENCOUNTER — Ambulatory Visit: Payer: Medicare HMO | Admitting: Podiatry

## 2019-06-16 NOTE — Progress Notes (Signed)
Subjective:  54 y.o. male with PMHx of diabetes mellitus presenting today with a chief complaint of throbbing pain to the medial right hallux secondary to a wound that appeared about 5 days ago. He reports associated redness, swelling and bruising of the toe. Walking and touching the wound increases the pain. He has been taking Percocet 10/325 mg (which he normally takes for chronic back and knee pain) for treatment. Patient is here for further evaluation and treatment.   Past Medical History:  Diagnosis Date  . Acute pericarditis    admx 03-26-10 thru 03-29-10; a. echo 03-26-10: EF 55-60%; mild LVH; trivial MR; RVF ok; mild -mod circumferential Eff w/o tamponade  . Allergic rhinitis, cause unspecified 09/14/2012  . Allergy   . Anxiety   . Chronic back pain   . DDD (degenerative disc disease), lumbar 09/14/2012  . Depression   . Diabetes mellitus without complication (HCC)    borderline   . Erectile dysfunction 09/14/2012  . GERD (gastroesophageal reflux disease)   . Gout    pt denies  . Headache    migraines in the past  . History of pancreatitis    a. admx 04-2009.Marland KitchenMarland Kitchen? 2-2 triglycerides  . Hyperlipidemia 07/11/2013  . Hypertension   . Hypertriglyceridemia    a. followed by [facility]  . Left sided sciatica 09/14/2012  . Metabolic syndrome   . Obesity   . OSA (obstructive sleep apnea)    CPAP  . Restless legs       Objective/Physical Exam General: The patient is alert and oriented x3 in no acute distress.  Dermatology:  Wound #1 noted to the right hallux measuring approximately 3.0 x 3.0 x 0.2 cm (LxWxD).   To the noted ulceration(s), there is no eschar. There is a moderate amount of slough, fibrin, and necrotic tissue noted. Granulation tissue and wound base is red. There is a minimal amount of serosanguineous drainage noted. There is no exposed bone muscle-tendon ligament or joint. There is no malodor. Periwound integrity is intact. Skin is warm, dry and supple  bilateral lower extremities.  Vascular: Palpable pedal pulses bilaterally. No edema or erythema noted. Capillary refill within normal limits.  Neurological: Epicritic and protective threshold diminished bilaterally.   Musculoskeletal Exam: Range of motion within normal limits to all pedal and ankle joints bilateral. Muscle strength 5/5 in all groups bilateral.   Assessment: 1. 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 diabetes mellitus w/ peripheral neuropathy   Plan of Care:  1. Patient was evaluated. 2. medically necessary excisional debridement including subcutaneous tissue was performed using a tissue nipper and a chisel blade. Excisional debridement of all the necrotic nonviable tissue down to healthy bleeding viable tissue was performed with post-debridement measurements same as pre-. 3. the wound was cleansed and dry sterile dressing applied. 4. Culture taken from wound.  5. Prescription for Doxycycline 100 mg #20 provided to patient.  6. Post op shoe dispensed.  7. Recommended Betadine ointment (provided) daily.  8. patient is to return to clinic in 2 weeks.   Edrick Kins, DPM [facility]  Dr. Edrick Kins, [address]                                        [place], [place] [address]  Office [phone]  Fax [phone]

## 2019-06-17 LAB — WOUND CULTURE
MICRO NUMBER:: 10264654
RESULT:: NO GROWTH
SPECIMEN QUALITY:: ADEQUATE

## 2019-06-17 LAB — CLIENT EDUCATION TRACKING

## 2019-06-17 LAB — HOUSE ACCOUNT TRACKING

## 2019-06-18 ENCOUNTER — Encounter: Payer: Self-pay | Admitting: Internal Medicine

## 2019-06-26 ENCOUNTER — Ambulatory Visit: Payer: Medicare HMO | Admitting: Podiatry

## 2019-06-26 ENCOUNTER — Other Ambulatory Visit: Payer: Self-pay

## 2019-06-26 DIAGNOSIS — B351 Tinea unguium: Secondary | ICD-10-CM | POA: Diagnosis not present

## 2019-06-26 DIAGNOSIS — E0843 Diabetes mellitus due to underlying condition with diabetic autonomic (poly)neuropathy: Secondary | ICD-10-CM | POA: Diagnosis not present

## 2019-06-26 DIAGNOSIS — M79676 Pain in unspecified toe(s): Secondary | ICD-10-CM | POA: Diagnosis not present

## 2019-06-26 DIAGNOSIS — L97512 Non-pressure chronic ulcer of other part of right foot with fat layer exposed: Secondary | ICD-10-CM | POA: Diagnosis not present

## 2019-06-26 MED ORDER — SANTYL 250 UNIT/GM EX OINT: 1.0000 "application " | TOPICAL_OINTMENT | Freq: Every day | CUTANEOUS | 0 refills | Status: DC

## 2019-07-03 NOTE — Progress Notes (Signed)
Subjective:  54 y.o. male with PMHx of diabetes mellitus presenting today for follow up evaluation of an ulceration of the right hallux. He reports some intermittent soreness from the wound. He has been using the post op shoe as directed. There are no worsening factors noted.  He is also complaining of elongated, thickened nails that cause pain while ambulating in shoes. He is unable to trim his own nails. Patient is here for further evaluation and treatment.   Past Medical History:  Diagnosis Date  . Acute pericarditis    admx 03-26-10 thru 03-29-10; a. echo 03-26-10: EF 55-60%; mild LVH; trivial MR; RVF ok; mild -mod circumferential Eff w/o tamponade  . Allergic rhinitis, cause unspecified 09/14/2012  . Allergy   . Anxiety   . Chronic back pain   . DDD (degenerative disc disease), lumbar 09/14/2012  . Depression   . Diabetes mellitus without complication (HCC)    borderline   . Erectile dysfunction 09/14/2012  . GERD (gastroesophageal reflux disease)   . Gout    pt denies  . Headache    migraines in the past  . History of pancreatitis    a. admx 04-2009.Marland KitchenMarland Kitchen? 2-2 triglycerides  . Hyperlipidemia 07/11/2013  . Hypertension   . Hypertriglyceridemia    a. followed by [facility]  . Left sided sciatica 09/14/2012  . Metabolic syndrome   . Obesity   . OSA (obstructive sleep apnea)    CPAP  . Restless legs       Objective/Physical Exam General: The patient is alert and oriented x3 in no acute distress.  Dermatology:  Wound #1 noted to the right hallux measuring approximately 2.0 x 2.0 x 0.2 cm (LxWxD).   To the noted ulceration(s), there is no eschar. There is a moderate amount of slough, fibrin, and necrotic tissue noted. Granulation tissue and wound base is red. There is a minimal amount of serosanguineous drainage noted. There is no exposed bone muscle-tendon ligament or joint. There is no malodor. Periwound integrity is intact. Nails are tender, long, thickened and  dystrophic with subungual debris, consistent with onychomycosis, 1-5 bilateral. Skin is warm, dry and supple bilateral lower extremities.  Vascular: Palpable pedal pulses bilaterally. No edema or erythema noted. Capillary refill within normal limits.  Neurological: Epicritic and protective threshold diminished bilaterally.   Musculoskeletal Exam: Range of motion within normal limits to all pedal and ankle joints bilateral. Muscle strength 5/5 in all groups bilateral.   Assessment: 1. 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 diabetes mellitus w/ peripheral neuropathy 3. Onychomycosis of nail due to dermatophyte bilateral    Plan of Care:  1. Patient was evaluated. 2. medically necessary excisional debridement including subcutaneous tissue was performed using a tissue nipper and a chisel blade. Excisional debridement of all the necrotic nonviable tissue down to healthy bleeding viable tissue was performed with post-debridement measurements same as pre-. 3. the wound was cleansed and dry sterile dressing applied. 4. Mechanical debridement of nails 1-5 bilaterally performed using a nail nipper. Filed with dremel without incident.  5. Prescription for Santyl provided to patient.  6. Continue using post op shoe.  7. Return to clinic in 3 weeks.    Edrick Kins, DPM [facility]  Dr. Edrick Kins, DPM    [address]  [place], [place] [address]                Office [phone]  Fax [phone]

## 2019-07-11 DIAGNOSIS — Z7189 Other specified counseling: Secondary | ICD-10-CM | POA: Insufficient documentation

## 2019-07-11 NOTE — Progress Notes (Deleted)
Cardiology Office Note   Date:  07/11/2019   ID:  [name], DOB 01-23-1966, MRN [id number]  PCP:  Biagio Borg, MD  Cardiologist:   Minus Breeding, MD Referring:  ***  No chief complaint on file.     History of Present Illness: Harold [name] is a 54 y.o. male  who presents for evaluation of multiple cardiovascular risk factors.  I saw him in the past for pericarditis.    In 2017 he had a POET (Plain Old Exercise Treadmill) after the last visit.  He had no ischemia but did have a hypertensive response.     Since I last saw him ***   he has done well.  The patient denies any new symptoms such as chest discomfort, neck or arm discomfort. There has been no new shortness of breath, PND or orthopnea. There have been no reported palpitations, presyncope or syncope.  His weight is up a little.  He wants to get the mirror that you use to exercise.  He is not currently exercising much.     Past Medical History:  Diagnosis Date  . Acute pericarditis    admx 03-26-10 thru 03-29-10; a. echo 03-26-10: EF 55-60%; mild LVH; trivial MR; RVF ok; mild -mod circumferential Eff w/o tamponade  . Allergic rhinitis, cause unspecified 09/14/2012  . Allergy   . Anxiety   . Chronic back pain   . DDD (degenerative disc disease), lumbar 09/14/2012  . Depression   . Diabetes mellitus without complication (HCC)    borderline   . Erectile dysfunction 09/14/2012  . GERD (gastroesophageal reflux disease)   . Gout    pt denies  . Headache    migraines in the past  . History of pancreatitis    a. admx 04-2009.Marland KitchenMarland Kitchen? 2-2 triglycerides  . Hyperlipidemia 07/11/2013  . Hypertension   . Hypertriglyceridemia    a. followed by [facility]  . Left sided sciatica 09/14/2012  . Metabolic syndrome   . Obesity   . OSA (obstructive sleep apnea)    CPAP  . Restless legs     Past Surgical History:  Procedure Laterality Date  . COLONOSCOPY    . KNEE SURGERY     right  . LUMBAR LAMINECTOMY/DECOMPRESSION  MICRODISCECTOMY N/A 08/26/2015   Procedure: Lumbar Two-Sacral One  Laminectomy for decompression;  Surgeon: [name], MD;  Location: [facility];  Service: Neurosurgery;  Laterality: N/A;  . pericardectomy  07/15/2010   Dr. [name]  . pleurx catheter placement  07/15/2010   Dr. [name]  . SHOULDER SURGERY     right and left shoulders  . TONSILLECTOMY AND ADENOIDECTOMY     one tonsil     Current Outpatient Medications  Medication Sig Dispense Refill  . amLODipine (NORVASC) 10 MG tablet Take 1 tablet (10 mg total) by mouth daily. **OFFICE VISIT IS DUE** 90 tablet 0  . ammonium lactate (AMLACTIN) 12 % lotion Apply 1 application topically as needed for dry skin. 400 g 3  . aspirin EC 81 MG tablet Take 81 mg by mouth daily.    . Blood Glucose Monitoring Suppl (ONE TOUCH ULTRA 2) w/Device KIT Use as directed daily E11.9 1 each 0  . cetirizine (ZYRTEC) 5 MG tablet Take 5 mg by mouth daily as needed for allergies.     . citalopram (CELEXA) 40 MG tablet TAKE ONE TABLET BY MOUTH DAILY 90 tablet 2  . collagenase (SANTYL) ointment Apply 1 application topically daily. 15 g  0  . doxycycline (VIBRA-TABS) 100 MG tablet Take 1 tablet (100 mg total) by mouth 2 (two) times daily. 20 tablet 0  . fenofibrate (TRICOR) 145 MG tablet Take 1 tablet (145 mg total) by mouth daily. 90 tablet 3  . fentaNYL (DURAGESIC - DOSED MCG/HR) 50 MCG/HR Place 50 mcg onto the skin every 3 (three) days.    . fluconazole (DIFLUCAN) 100 MG tablet 1 tablet twice per day x 1day, then 1 qod x 10 days 7 tablet 0  . furosemide (LASIX) 40 MG tablet TAKE ONE TABLET BY MOUTH DAILY 90 tablet 3  . gabapentin (NEURONTIN) 300 MG capsule Take 900 mg by mouth 3 (three) times daily.    [name] glucose blood test strip Use as instructed once daily E119 100 each 12  . ibuprofen (ADVIL,MOTRIN) 800 MG tablet Take 800 mg by mouth every 8 (eight) hours as needed.    . Lancets MISC Use as directed daily E11.9 100 each 11  . lisinopril (ZESTRIL)  20 MG tablet TAKE ONE TABLET BY MOUTH TWICE A DAY 180 tablet 0  . lisinopril (ZESTRIL) 40 MG tablet TAKE ONE TABLET BY MOUTH DAILY 90 tablet 2  . metFORMIN (GLUCOPHAGE-XR) 500 MG 24 hr tablet TAKE TWO TABLETS BY MOUTH DAILY WITH BREAKFAST 180 tablet 2  . OVER THE COUNTER MEDICATION Take 500 mg by mouth daily.    [name] oxyCODONE-acetaminophen (PERCOCET) 10-325 MG tablet Take 1 tablet by mouth every 6 (six) hours as needed for pain. 90 tablet 0  . Potassium Chloride ER 20 MEQ TBCR TAKE ONE TABLET BY MOUTH DAILY 90 tablet 2  . predniSONE (DELTASONE) 10 MG tablet 3 tabs by mouth per day for 3 days,2tabs per day for 3 days,1tab per day for 3 days 18 tablet 0  . rosuvastatin (CRESTOR) 20 MG tablet TAKE ONE TABLET BY MOUTH DAILY 90 tablet 1  . sildenafil (REVATIO) 20 MG tablet TAKE ONE TO FIVE TABLETS BY MOUTH ONCE DAILY AS NEEDED 100 tablet 9  . terbinafine (LAMISIL AT) 1 % cream Apply 1 application topically daily as needed (foot fungus). 42 g 0  . terbinafine (LAMISIL) 250 MG tablet Take 1 tablet (250 mg total) by mouth daily. 90 tablet 1  . tiZANidine (ZANAFLEX) 4 MG tablet     . tolterodine (DETROL) 2 MG tablet TAKE 1 TABLET TWICE DAILY 180 tablet 1  . triamcinolone (NASACORT) 55 MCG/ACT AERO nasal inhaler Place 2 sprays into the nose daily. 1 Inhaler 12  . VOLTAREN 1 % GEL Apply 2 g topically 4 (four) times daily. 100 g 5   No current facility-administered medications for this visit.    Allergies:   Other, Diphenhydramine, Colchicine, Lipitor [atorvastatin], and Septra [sulfamethoxazole-trimethoprim]    ROS:  Please see the history of present illness.   Otherwise, review of systems are positive for {NONE DEFAULTED:18576::"none"}.   All other systems are reviewed and negative.    PHYSICAL EXAM: VS:  There were no vitals taken for this visit. , BMI There is no height or weight on file to calculate BMI. GENERAL:  Well appearing NECK:  No jugular venous distention, waveform within normal limits,  carotid upstroke brisk and symmetric, no bruits, no thyromegaly LUNGS:  Clear to auscultation bilaterally CHEST:  Unremarkable HEART:  PMI not displaced or sustained,S1 and S2 within normal limits, no S3, no S4, no clicks, no rubs, *** murmurs ABD:  Flat, positive bowel sounds normal in frequency in pitch, no bruits, no rebound, no guarding, no midline pulsatile  mass, no hepatomegaly, no splenomegaly EXT:  2 plus pulses throughout, no edema, no cyanosis no clubbing   ***GENERAL:  Well appearing HEENT:  Pupils equal round and reactive, fundi not visualized, oral mucosa unremarkable NECK:  No jugular venous distention, waveform within normal limits, carotid upstroke brisk and symmetric, no bruits, no thyromegaly LYMPHATICS:  No cervical, inguinal adenopathy LUNGS:  Clear to auscultation bilaterally BACK:  No CVA tenderness CHEST:  Unremarkable HEART:  PMI not displaced or sustained,S1 and S2 within normal limits, no S3, no S4, no clicks, no rubs, *** murmurs ABD:  Flat, positive bowel sounds normal in frequency in pitch, no bruits, no rebound, no guarding, no midline pulsatile mass, no hepatomegaly, no splenomegaly EXT:  2 plus pulses throughout, no edema, no cyanosis no clubbing SKIN:  No rashes no nodules NEURO:  Cranial nerves II through XII grossly intact, motor grossly intact throughout PSYCH:  Cognitively intact, oriented to person place and time    EKG:  EKG {ACTION; IS/IS EVO:[id number]} ordered today. The ekg ordered today demonstrates ***   Recent Labs: No results found for requested labs within last 8760 hours.    Lipid Panel    Component Value Date/Time   CHOL 135 02/13/2018 1641   TRIG (H) 02/13/2018 1641    552.0 Triglyceride is over 400; calculations on Lipids are invalid.   HDL 29.30 (L) 02/13/2018 1641   CHOLHDL 5 02/13/2018 1641   VLDL 29.0 11/19/2013 1206   LDLCALC 79 11/19/2013 1206   LDLDIRECT 53.0 02/13/2018 1641      Wt Readings from Last 3 Encounters:   01/07/19 260 lb 12.8 oz (118.3 kg)  07/26/18 263 lb (119.3 kg)  06/01/18 266 lb (120.7 kg)      Other studies Reviewed: Additional studies/ records that were reviewed today include: ***. Review of the above records demonstrates:  Please see elsewhere in the note.  ***   ASSESSMENT AND PLAN:   OBESITY: ***  He and I talked again about this and he understands diet and exercise.    DM: *** His A1c is being followed by Biagio Borg, MD.  His A1c was 7.3 in Nov.    RISK REDUCTION: ***  I had previously given him the number for 1 800 QUITNOW. This is his number one health priority.   I talked to his wife about this as well with his permission.   HTN:     BP is *** elevated today but he assures me that this is unusual.  No change in therapy.  He needs weight loss.   BP is controlled barely and will come down with weight loss.  SLEEP APNEA:*** He did get a new machine but is waiting for a follow up sleep study.    COVID EDUCATION:  ***   he signs and symptoms of COVID-19 were discussed with the patient and how to seek care for testing (follow up with PCP or arrange E-visit).  The importance of social distancing was discussed today.   Current medicines are reviewed at length with the patient today.  The patient {ACTIONS; HAS/DOES NOT HAVE:19233} concerns regarding medicines.  The following changes have been made:  {PLAN; NO CHANGE:13088:s}  Labs/ tests ordered today include: *** No orders of the defined types were placed in this encounter.    Disposition:   FU with ***    Signed, Minus Breeding, MD  07/11/2019 9:43 PM    [facility]

## 2019-07-12 ENCOUNTER — Ambulatory Visit: Payer: Medicare HMO | Admitting: Cardiology

## 2019-07-17 ENCOUNTER — Other Ambulatory Visit: Payer: Self-pay

## 2019-07-17 ENCOUNTER — Ambulatory Visit (INDEPENDENT_AMBULATORY_CARE_PROVIDER_SITE_OTHER): Payer: Medicare HMO | Admitting: Podiatry

## 2019-07-17 VITALS — Temp 97.7°F

## 2019-07-17 DIAGNOSIS — E0843 Diabetes mellitus due to underlying condition with diabetic autonomic (poly)neuropathy: Secondary | ICD-10-CM | POA: Diagnosis not present

## 2019-07-17 DIAGNOSIS — L97512 Non-pressure chronic ulcer of other part of right foot with fat layer exposed: Secondary | ICD-10-CM | POA: Diagnosis not present

## 2019-07-18 NOTE — Progress Notes (Signed)
Cardiology Office Note   Date:  07/19/2019   ID:  Harold [name], DOB Feb 23, 1966, MRN [id number]  PCP:  Biagio Borg, MD  Cardiologist:   Minus Breeding, MD   Chief Complaint  Patient presents with  . Back Pain      History of Present Illness: Harold [name] is a 54 y.o. male  who presents for evaluation of multiple cardiovascular risk factors.  I saw him in the past for pericarditis.    In 2017 he had a POET (Plain Old Exercise Treadmill) after the last visit.  He had no ischemia but did have a hypertensive response.     Since I last saw him he has done relatively well.  His fiance died from an aneurysm.  He has had chronic back pain and walks with a stick.  He is also had an ulcer on his foot because he stubbed his toe.  He is getting that treated.  He has not had any new shortness of breath, PND or orthopnea.  Had no new palpitations, presyncope or syncope.  He is limited in his activities.  He is not having any chest pressure, neck or arm discomfort.  He has lost some weight.  He has had no new swelling.  Past Medical History:  Diagnosis Date  . Acute pericarditis    admx 03-26-10 thru 03-29-10; a. echo 03-26-10: EF 55-60%; mild LVH; trivial MR; RVF ok; mild -mod circumferential Eff w/o tamponade  . Allergic rhinitis, cause unspecified 09/14/2012  . Allergy   . Anxiety   . Chronic back pain   . DDD (degenerative disc disease), lumbar 09/14/2012  . Depression   . Diabetes mellitus without complication (HCC)    borderline   . Erectile dysfunction 09/14/2012  . GERD (gastroesophageal reflux disease)   . Gout    pt denies  . Headache    migraines in the past  . History of pancreatitis    a. admx 04-2009.Marland KitchenMarland Kitchen? 2-2 triglycerides  . Hyperlipidemia 07/11/2013  . Hypertension   . Hypertriglyceridemia    a. followed by [facility]  . Left sided sciatica 09/14/2012  . Metabolic syndrome   . Obesity   . OSA (obstructive sleep apnea)    CPAP  . Restless legs     Past  Surgical History:  Procedure Laterality Date  . COLONOSCOPY    . KNEE SURGERY     right  . LUMBAR LAMINECTOMY/DECOMPRESSION MICRODISCECTOMY N/A 08/26/2015   Procedure: Lumbar Two-Sacral One  Laminectomy for decompression;  Surgeon: [name], MD;  Location: [facility];  Service: Neurosurgery;  Laterality: N/A;  . pericardectomy  07/15/2010   Dr. [name]  . pleurx catheter placement  07/15/2010   Dr. [name]  . SHOULDER SURGERY     right and left shoulders  . TONSILLECTOMY AND ADENOIDECTOMY     one tonsil     Current Outpatient Medications  Medication Sig Dispense Refill  . amLODipine (NORVASC) 10 MG tablet Take 1 tablet (10 mg total) by mouth daily. **OFFICE VISIT IS DUE** 90 tablet 0  . ammonium lactate (AMLACTIN) 12 % lotion Apply 1 application topically as needed for dry skin. 400 g 3  . aspirin EC 81 MG tablet Take 81 mg by mouth daily.    . Blood Glucose Monitoring Suppl (ONE TOUCH ULTRA 2) w/Device KIT Use as directed daily E11.9 1 each 0  . cetirizine (ZYRTEC) 5 MG tablet Take 5 mg by mouth daily as needed for allergies.     [name]  citalopram (CELEXA) 40 MG tablet TAKE ONE TABLET BY MOUTH DAILY 90 tablet 2  . collagenase (SANTYL) ointment Apply 1 application topically daily. 15 g 0  . doxycycline (VIBRA-TABS) 100 MG tablet Take 1 tablet (100 mg total) by mouth 2 (two) times daily. 20 tablet 0  . fenofibrate (TRICOR) 145 MG tablet Take 1 tablet (145 mg total) by mouth daily. 90 tablet 3  . fentaNYL (DURAGESIC - DOSED MCG/HR) 50 MCG/HR Place 50 mcg onto the skin every 3 (three) days.    . fluconazole (DIFLUCAN) 100 MG tablet 1 tablet twice per day x 1day, then 1 qod x 10 days 7 tablet 0  . furosemide (LASIX) 40 MG tablet TAKE ONE TABLET BY MOUTH DAILY 90 tablet 3  . gabapentin (NEURONTIN) 300 MG capsule Take 900 mg by mouth 3 (three) times daily.    [name] glucose blood test strip Use as instructed once daily E119 100 each 12  . ibuprofen (ADVIL,MOTRIN) 800 MG tablet Take 800  mg by mouth every 8 (eight) hours as needed.    . Lancets MISC Use as directed daily E11.9 100 each 11  . lisinopril (ZESTRIL) 20 MG tablet TAKE ONE TABLET BY MOUTH TWICE A DAY 180 tablet 0  . lisinopril (ZESTRIL) 40 MG tablet TAKE ONE TABLET BY MOUTH DAILY 90 tablet 2  . metFORMIN (GLUCOPHAGE-XR) 500 MG 24 hr tablet TAKE TWO TABLETS BY MOUTH DAILY WITH BREAKFAST 180 tablet 2  . OVER THE COUNTER MEDICATION Take 500 mg by mouth daily.    [name] oxyCODONE-acetaminophen (PERCOCET) 10-325 MG tablet Take 1 tablet by mouth every 6 (six) hours as needed for pain. 90 tablet 0  . Potassium Chloride ER 20 MEQ TBCR TAKE ONE TABLET BY MOUTH DAILY 90 tablet 2  . predniSONE (DELTASONE) 10 MG tablet 3 tabs by mouth per day for 3 days,2tabs per day for 3 days,1tab per day for 3 days 18 tablet 0  . rosuvastatin (CRESTOR) 20 MG tablet TAKE ONE TABLET BY MOUTH DAILY 90 tablet 1  . sildenafil (REVATIO) 20 MG tablet TAKE ONE TO FIVE TABLETS BY MOUTH ONCE DAILY AS NEEDED 100 tablet 9  . terbinafine (LAMISIL AT) 1 % cream Apply 1 application topically daily as needed (foot fungus). 42 g 0  . terbinafine (LAMISIL) 250 MG tablet Take 1 tablet (250 mg total) by mouth daily. 90 tablet 1  . tiZANidine (ZANAFLEX) 4 MG tablet     . tolterodine (DETROL) 2 MG tablet TAKE 1 TABLET TWICE DAILY 180 tablet 1  . triamcinolone (NASACORT) 55 MCG/ACT AERO nasal inhaler Place 2 sprays into the nose daily. 1 Inhaler 12  . VOLTAREN 1 % GEL Apply 2 g topically 4 (four) times daily. 100 g 5   No current facility-administered medications for this visit.    Allergies:   Other, Diphenhydramine, Colchicine, Lipitor [atorvastatin], and Septra [sulfamethoxazole-trimethoprim]    ROS:  Please see the history of present illness.   Otherwise, review of systems are positive for none.   All other systems are reviewed and negative.    PHYSICAL EXAM: VS:  BP (!) 148/84 (BP Location: Left Arm, Patient Position: Sitting, Cuff Size: Large)   Pulse 72    Temp (!) 97.3 F (36.3 C)   Ht '5\' 10"'  (1.778 m)   Wt 252 lb (114.3 kg)   BMI 36.16 kg/m  , BMI Body mass index is 36.16 kg/m. GENERAL:  Well appearing NECK:  No jugular venous distention, waveform within normal limits, carotid upstroke brisk  and symmetric, no bruits, no thyromegaly LUNGS:  Clear to auscultation bilaterally CHEST:  Unremarkable HEART:  PMI not displaced or sustained,S1 and S2 within normal limits, no S3, no S4, no clicks, no rubs, no murmurs ABD:  Flat, positive bowel sounds normal in frequency in pitch, no bruits, no rebound, no guarding, no midline pulsatile mass, no hepatomegaly, no splenomegaly EXT:  2 plus pulses throughout, no edema, no cyanosis no clubbing   EKG:  EKG is ordered today. The ekg ordered today demonstrates sinus rhythm, rate 72, axis within normal limits, QTC prolonged, poor anterior R wave progression, no acute ST-T wave changes.   Recent Labs: No results found for requested labs within last 8760 hours.    Lipid Panel    Component Value Date/Time   CHOL 135 02/13/2018 1641   TRIG (H) 02/13/2018 1641    552.0 Triglyceride is over 400; calculations on Lipids are invalid.   HDL 29.30 (L) 02/13/2018 1641   CHOLHDL 5 02/13/2018 1641   VLDL 29.0 11/19/2013 1206   LDLCALC 79 11/19/2013 1206   LDLDIRECT 53.0 02/13/2018 1641      Wt Readings from Last 3 Encounters:  07/19/19 252 lb (114.3 kg)  01/07/19 260 lb 12.8 oz (118.3 kg)  07/26/18 263 lb (119.3 kg)      Other studies Reviewed: Additional studies/ records that were reviewed today include: Labs. Review of the above records demonstrates:  Please see elsewhere in the note.     ASSESSMENT AND PLAN:   OBESITY: He has lost 14 pounds through diet and exercise.  I am proud of him encouraged more the same.  DM:  I do not see a recent A1c so I will check this.   RISK REDUCTION: He did quit smoking for a while but started again with Covid but he says he will try again to quit.   I will bring him back and check a lipid profile.  HTN:     BP is mildly elevated today.  I am going to continue the meds as listed and treat his sleep apnea.   QT PROLONGED: He has had no symptoms related to this.  This is a new finding.  I will check a magnesium with his electrolytes.  He should avoid QT prolonging drugs.  SLEEP APNEA: He needs to have repeat sleep study.  He did get a new machine last year but the sleep study was canceled for he did have follow-up because of Covid.  I will get a get him an appointment with Dr. Claiborne Billings.   COVID EDUCATION: He has had his vaccine.  PERICARDIAL EFFUSION: He had this treated years ago.  I would not suspect recurrence.   Current medicines are reviewed at length with the patient today.  The patient does not have concerns regarding medicines.  The following changes have been made:  no change  Labs/ tests ordered today include:   Orders Placed This Encounter  Procedures  . Lipid panel  . CBC  . Basic metabolic panel  . Hemoglobin A1c  . Magnesium  . EKG 12-Lead     Disposition:   FU with me in one year.     Signed, Minus Breeding, MD  07/19/2019 5:35 PM    [facility]

## 2019-07-19 ENCOUNTER — Ambulatory Visit: Payer: Medicare HMO | Admitting: Cardiology

## 2019-07-19 ENCOUNTER — Encounter: Payer: Self-pay | Admitting: Cardiology

## 2019-07-19 ENCOUNTER — Other Ambulatory Visit: Payer: Self-pay

## 2019-07-19 VITALS — BP 148/84 | HR 72 | Temp 97.3°F | Ht 70.0 in | Wt 252.0 lb

## 2019-07-19 DIAGNOSIS — G4730 Sleep apnea, unspecified: Secondary | ICD-10-CM

## 2019-07-19 DIAGNOSIS — Z79899 Other long term (current) drug therapy: Secondary | ICD-10-CM | POA: Diagnosis not present

## 2019-07-19 DIAGNOSIS — Z720 Tobacco use: Secondary | ICD-10-CM

## 2019-07-19 DIAGNOSIS — I10 Essential (primary) hypertension: Secondary | ICD-10-CM

## 2019-07-19 DIAGNOSIS — E785 Hyperlipidemia, unspecified: Secondary | ICD-10-CM | POA: Diagnosis not present

## 2019-07-19 DIAGNOSIS — Z7189 Other specified counseling: Secondary | ICD-10-CM | POA: Diagnosis not present

## 2019-07-19 NOTE — Patient Instructions (Signed)
Medication Instructions:  NO CHANGES *If you need a refill on your cardiac medications before your next appointment, please call your pharmacy*  Lab Work: Your physician recommends that you return for lab work: CBC, BMP, A1C, LIPIDS, MAG If you have labs (blood work) drawn today and your tests are completely normal, you will receive your results only by: [name] MyChart Message (if you have MyChart) OR . A paper copy in the mail If you have any lab test that is abnormal or we need to change your treatment, we will call you to review the results.  Testing/Procedures: NONE ORDERED THIS VISIT  Follow-Up: At [facility], you and your health needs are our priority.  As part of our continuing mission to provide you with exceptional heart care, we have created designated Provider Care Teams.  These Care Teams include your primary Cardiologist (physician) and Advanced Practice Providers (APPs -  Physician Assistants and Nurse Practitioners) who all work together to provide you with the care you need, when you need it.  Your next appointment:   12 month(s)  The format for your next appointment:   In Person  Provider:   Minus Breeding, MD  Other Instructions NEW PATIENT [place] / [place]

## 2019-07-22 NOTE — Progress Notes (Signed)
   Subjective:  54 y.o. male with PMHx of diabetes mellitus presenting today for follow up evaluation of an ulceration of the right hallux. He states he is doing well and the wound is improving. He has been using Santyl and the post op shoe as directed. He denies any purulent drainage or bleeding. Patient is here for further evaluation and treatment.   Past Medical History:  Diagnosis Date  . Acute pericarditis    admx 03-26-10 thru 03-29-10; a. echo 03-26-10: EF 55-60%; mild LVH; trivial MR; RVF ok; mild -mod circumferential Eff w/o tamponade  . Allergic rhinitis, cause unspecified 09/14/2012  . Allergy   . Anxiety   . Chronic back pain   . DDD (degenerative disc disease), lumbar 09/14/2012  . Depression   . Diabetes mellitus without complication (HCC)    borderline   . Erectile dysfunction 09/14/2012  . GERD (gastroesophageal reflux disease)   . Gout    pt denies  . Headache    migraines in the past  . History of pancreatitis    a. admx 04-2009.Marland KitchenMarland Kitchen? 2-2 triglycerides  . Hyperlipidemia 07/11/2013  . Hypertension   . Hypertriglyceridemia    a. followed by [facility]  . Left sided sciatica 09/14/2012  . Metabolic syndrome   . Obesity   . OSA (obstructive sleep apnea)    CPAP  . Restless legs       Objective/Physical Exam General: The patient is alert and oriented x3 in no acute distress.  Dermatology:  Wound #1 noted to the right hallux measuring approximately 1.5 x 1.5 x 0.1 cm (LxWxD).   To the noted ulceration(s), there is no eschar. There is a moderate amount of slough, fibrin, and necrotic tissue noted. Granulation tissue and wound base is red. There is a minimal amount of serosanguineous drainage noted. There is no exposed bone muscle-tendon ligament or joint. There is no malodor. Periwound integrity is intact. Skin is warm, dry and supple bilateral lower extremities.  Vascular: Palpable pedal pulses bilaterally. No edema or erythema noted. Capillary refill within  normal limits.  Neurological: Epicritic and protective threshold diminished bilaterally.   Musculoskeletal Exam: Range of motion within normal limits to all pedal and ankle joints bilateral. Muscle strength 5/5 in all groups bilateral.   Assessment: 1. 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 diabetes mellitus w/ peripheral neuropathy   Plan of Care:  1. Patient was evaluated. 2. medically necessary excisional debridement including subcutaneous tissue was performed using a tissue nipper and a chisel blade. Excisional debridement of all the necrotic nonviable tissue down to healthy bleeding viable tissue was performed with post-debridement measurements same as pre-. 3. the wound was cleansed and dry sterile dressing applied. 4. Continue using Santyl ointment daily.  5. Continue using post op shoe.  6. Return to clinic in 3 weeks.    Edrick Kins, DPM [facility]  Dr. Edrick Kins, [address]                                        [place], [place] [address]                Office [phone]  Fax [phone]

## 2019-08-05 DIAGNOSIS — M48061 Spinal stenosis, lumbar region without neurogenic claudication: Secondary | ICD-10-CM | POA: Diagnosis not present

## 2019-08-06 ENCOUNTER — Ambulatory Visit (INDEPENDENT_AMBULATORY_CARE_PROVIDER_SITE_OTHER): Payer: Medicare HMO | Admitting: Internal Medicine

## 2019-08-06 ENCOUNTER — Other Ambulatory Visit: Payer: Self-pay

## 2019-08-06 ENCOUNTER — Encounter: Payer: Self-pay | Admitting: Internal Medicine

## 2019-08-06 ENCOUNTER — Other Ambulatory Visit: Payer: Self-pay | Admitting: Internal Medicine

## 2019-08-06 VITALS — BP 140/80 | HR 76 | Temp 98.5°F | Ht 70.0 in | Wt 250.0 lb

## 2019-08-06 DIAGNOSIS — E559 Vitamin D deficiency, unspecified: Secondary | ICD-10-CM

## 2019-08-06 DIAGNOSIS — R2689 Other abnormalities of gait and mobility: Secondary | ICD-10-CM

## 2019-08-06 DIAGNOSIS — Z114 Encounter for screening for human immunodeficiency virus [HIV]: Secondary | ICD-10-CM | POA: Diagnosis not present

## 2019-08-06 DIAGNOSIS — Z0001 Encounter for general adult medical examination with abnormal findings: Secondary | ICD-10-CM

## 2019-08-06 DIAGNOSIS — M545 Low back pain, unspecified: Secondary | ICD-10-CM

## 2019-08-06 DIAGNOSIS — G8929 Other chronic pain: Secondary | ICD-10-CM

## 2019-08-06 DIAGNOSIS — I10 Essential (primary) hypertension: Secondary | ICD-10-CM

## 2019-08-06 DIAGNOSIS — E538 Deficiency of other specified B group vitamins: Secondary | ICD-10-CM

## 2019-08-06 DIAGNOSIS — E119 Type 2 diabetes mellitus without complications: Secondary | ICD-10-CM | POA: Diagnosis not present

## 2019-08-06 DIAGNOSIS — R296 Repeated falls: Secondary | ICD-10-CM | POA: Diagnosis not present

## 2019-08-06 DIAGNOSIS — M5441 Lumbago with sciatica, right side: Secondary | ICD-10-CM | POA: Diagnosis not present

## 2019-08-06 DIAGNOSIS — I509 Heart failure, unspecified: Secondary | ICD-10-CM | POA: Diagnosis not present

## 2019-08-06 DIAGNOSIS — I110 Hypertensive heart disease with heart failure: Secondary | ICD-10-CM | POA: Diagnosis not present

## 2019-08-06 DIAGNOSIS — M5442 Lumbago with sciatica, left side: Secondary | ICD-10-CM

## 2019-08-06 HISTORY — DX: Other chronic pain: G89.29

## 2019-08-06 HISTORY — DX: Low back pain, unspecified: M54.50

## 2019-08-06 MED ORDER — SILDENAFIL CITRATE 100 MG PO TABS: 50.0000 mg | ORAL_TABLET | Freq: Every day | ORAL | 11 refills | Status: DC | PRN

## 2019-08-06 MED ORDER — FLUCONAZOLE 100 MG PO TABS: ORAL_TABLET | ORAL | 0 refills | Status: DC

## 2019-08-06 NOTE — Assessment & Plan Note (Signed)

## 2019-08-06 NOTE — Assessment & Plan Note (Signed)
stable overall by history and exam, recent data reviewed with pt, and pt to continue medical treatment as before,  to f/u any worsening symptoms or concerns  

## 2019-08-06 NOTE — Assessment & Plan Note (Signed)
Agree pt qualifies for scooter

## 2019-08-06 NOTE — Assessment & Plan Note (Signed)
FOR PT EVALUATION

## 2019-08-06 NOTE — Assessment & Plan Note (Addendum)
F/u surgury as planned  I spent 41 minutes in addition to time for CPX wellness examination in preparing to see the patient by review of recent labs, imaging and procedures, obtaining and reviewing separately obtained history, communicating with the patient and family or caregiver, ordering medications, tests or procedures, and documenting clinical information in the EHR including the differential Dx, treatment, and any further evaluation and other management of chronic lbp, chf, gait d/o, recurrent falls,  Htn, dM,

## 2019-08-06 NOTE — Progress Notes (Addendum)
Subjective:    Patient ID: Harold [name], male    DOB: 1966/01/04, 54 y.o.   MRN: [id number]  HPI  Here for wellness and f/u;  Overall doing ok;  Pt denies Chest pain, worsening SOB, DOE, wheezing, orthopnea, PND, worsening LE edema, palpitations, dizziness or syncope.  Pt denies neurological change such as new headache  Pt denies polydipsia, polyuria, or low sugar symptoms. Pt states overall good compliance with treatment and medications, good tolerability, and has been trying to follow appropriate diet.  Pt denies worsening depressive symptoms, suicidal ideation or panic. No fever, night sweats, wt loss, loss of appetite, or other constitutional symptoms.  Pt states worsening now poor ability with ADL's, has high fall risk, home safety reviewed and adequate, no other significant changes in hearing or vision, and now unable to be active with exercise.      Specifically, he needs the PMD to be able to transport himself to the toilet and bathing area, kitchen to prepare meals and cook and eat, as well bedroom for grooming and dress.  He has had recurrent fall x 6 since jan 1 without specific injury but remains at high risk.  He has known multiple concurrent impairments including painful peripheral neuropathy, chronic right knee pain with recurrent giveaways s/p surgury x 4, and chronic LBP now with reproducible bilat leg numbness, pain and weakness for which he  has follow up with Neurosurgury soon.  Pt is now unable to do grocery shopping by himself without use of store scooter due to predictable reproducible pain, numbness and weakness with walking in the store. Has had to leave the store several times when all of the scooters were in use.  He has had much difficulty with daily ADLs, and only able to shower every few days.  Pt cannot cook for himself since it takes too long to stand up when food is prepared due to pain after more 5 - 10 minutes.  Due to impairments, he he has also worsening generalized  weakness due to general deconditioning as well.        Pt is unable to use cane or walker due to the pain and unsteadiness to back legs and knees above with standing and walking that is not relieved using this  Pt is unable to use manual wheelchair as upper arm use exacerbates the back pain as well to 8/10 and cannot propel well due to neuropathies, and has worsening general deconditioning and weakness.  Pt is unable to benefit from POV due to back and knee pain unable to allow him to transfer off and on, as well as postural instability   Also with recurrent fall x 6 since jan 1 per pt, no specific injuries, has known multiple concurrent impairments including neuropathy, right knee recurrent giveaways s/p surgury x 4, and chronic LBP now with reproducible bilat leg numbness and weakness and sometimes pain with walking, has f/u with NS soon.  Now unable to do grocery shopping without use of store scooter due to predictable pain, numbness and weakness with walking in the store.  Much difficulty with ADLs and only able to shower every few days.  Cannot cook for himself since it takes too long to get up when food is cooked, and unable to stand while cooking more than a couple of minutes.  Has worsening generalized weakness.  Has not had trial of PT for scooter evaluation.   Past Medical History:  Diagnosis Date  . Acute pericarditis  admx 03-26-10 thru 03-29-10; a. echo 03-26-10: EF 55-60%; mild LVH; trivial MR; RVF ok; mild -mod circumferential Eff w/o tamponade  . Allergic rhinitis, cause unspecified 09/14/2012  . Allergy   . Anxiety   . Chronic back pain   . DDD (degenerative disc disease), lumbar 09/14/2012  . Depression   . Diabetes mellitus without complication (HCC)    borderline   . Erectile dysfunction 09/14/2012  . GERD (gastroesophageal reflux disease)   . Gout    pt denies  . Headache    migraines in the past  . History of pancreatitis    a. admx 04-2009.Marland KitchenMarland Kitchen? 2-2 triglycerides  .  Hyperlipidemia 07/11/2013  . Hypertension   . Hypertriglyceridemia    a. followed by [facility]  . Left sided sciatica 09/14/2012  . Metabolic syndrome   . Obesity   . OSA (obstructive sleep apnea)    CPAP  . Restless legs    Past Surgical History:  Procedure Laterality Date  . COLONOSCOPY    . KNEE SURGERY     right  . LUMBAR LAMINECTOMY/DECOMPRESSION MICRODISCECTOMY N/A 08/26/2015   Procedure: Lumbar Two-Sacral One  Laminectomy for decompression;  Surgeon: [name], MD;  Location: [facility];  Service: Neurosurgery;  Laterality: N/A;  . pericardectomy  07/15/2010   Dr. [name]  . pleurx catheter placement  07/15/2010   Dr. [name]  . SHOULDER SURGERY     right and left shoulders  . TONSILLECTOMY AND ADENOIDECTOMY     one tonsil    reports that he has been smoking cigarettes. He has a 10.00 pack-year smoking history. He has never used smokeless tobacco. He reports current alcohol use of about 2.0 standard drinks of alcohol per week. He reports that he does not use drugs. family history includes Colon cancer (age of onset: 34) in his father; Diabetes in his father, mother, and sister; Heart disease (age of onset: 21) in his father; Hypertension in an other family member; Liver cancer in his paternal uncle; Pancreatitis in his father and paternal uncle; Stomach cancer in his maternal grandfather. Allergies  Allergen Reactions  . Other Itching, Swelling and Palpitations    Pecans: itching and swelling of the tongue   . Diphenhydramine Itching, Palpitations and Other (See Comments)    "jittery"  . Colchicine Diarrhea  . Lipitor [Atorvastatin] Other (See Comments)    Muscle cramps  . Septra [Sulfamethoxazole-Trimethoprim] Itching   Current Outpatient Medications on File Prior to Visit  Medication Sig Dispense Refill  . amLODipine (NORVASC) 10 MG tablet Take 1 tablet (10 mg total) by mouth daily. **OFFICE VISIT IS DUE** 90 tablet 0  . ammonium lactate (AMLACTIN)  12 % lotion Apply 1 application topically as needed for dry skin. 400 g 3  . aspirin EC 81 MG tablet Take 81 mg by mouth daily.    . Blood Glucose Monitoring Suppl (ONE TOUCH ULTRA 2) w/Device KIT Use as directed daily E11.9 1 each 0  . cetirizine (ZYRTEC) 5 MG tablet Take 5 mg by mouth daily as needed for allergies.     . citalopram (CELEXA) 40 MG tablet TAKE ONE TABLET BY MOUTH DAILY 90 tablet 2  . collagenase (SANTYL) ointment Apply 1 application topically daily. 15 g 0  . doxycycline (VIBRA-TABS) 100 MG tablet Take 1 tablet (100 mg total) by mouth 2 (two) times daily. 20 tablet 0  . fenofibrate (TRICOR) 145 MG tablet Take 1 tablet (145 mg total) by mouth daily. 90 tablet 3  .  fentaNYL (DURAGESIC - DOSED MCG/HR) 50 MCG/HR Place 50 mcg onto the skin every 3 (three) days.    . furosemide (LASIX) 40 MG tablet TAKE ONE TABLET BY MOUTH DAILY 90 tablet 3  . gabapentin (NEURONTIN) 300 MG capsule Take 900 mg by mouth 3 (three) times daily.    [name] glucose blood test strip Use as instructed once daily E119 100 each 12  . ibuprofen (ADVIL,MOTRIN) 800 MG tablet Take 800 mg by mouth every 8 (eight) hours as needed.    . Lancets MISC Use as directed daily E11.9 100 each 11  . lisinopril (ZESTRIL) 40 MG tablet TAKE ONE TABLET BY MOUTH DAILY 90 tablet 2  . metFORMIN (GLUCOPHAGE-XR) 500 MG 24 hr tablet TAKE TWO TABLETS BY MOUTH DAILY WITH BREAKFAST 180 tablet 2  . OVER THE COUNTER MEDICATION Take 500 mg by mouth daily.    [name] oxyCODONE-acetaminophen (PERCOCET) 10-325 MG tablet Take 1 tablet by mouth every 6 (six) hours as needed for pain. 90 tablet 0  . Potassium Chloride ER 20 MEQ TBCR TAKE ONE TABLET BY MOUTH DAILY 90 tablet 2  . predniSONE (DELTASONE) 10 MG tablet 3 tabs by mouth per day for 3 days,2tabs per day for 3 days,1tab per day for 3 days 18 tablet 0  . rosuvastatin (CRESTOR) 20 MG tablet TAKE ONE TABLET BY MOUTH DAILY 90 tablet 1  . terbinafine (LAMISIL AT) 1 % cream Apply 1 application topically  daily as needed (foot fungus). 42 g 0  . terbinafine (LAMISIL) 250 MG tablet Take 1 tablet (250 mg total) by mouth daily. 90 tablet 1  . tiZANidine (ZANAFLEX) 4 MG tablet     . tolterodine (DETROL) 2 MG tablet TAKE 1 TABLET TWICE DAILY 180 tablet 1  . triamcinolone (NASACORT) 55 MCG/ACT AERO nasal inhaler Place 2 sprays into the nose daily. 1 Inhaler 12  . VOLTAREN 1 % GEL Apply 2 g topically 4 (four) times daily. 100 g 5  . diclofenac Sodium (VOLTAREN) 1 % GEL      No current facility-administered medications on file prior to visit.   Review of Systems All otherwise neg per pt     Objective:   Physical Exam BP 140/80 (BP Location: Left Arm, Patient Position: Sitting, Cuff Size: Large)   Pulse 76   Temp 98.5 F (36.9 C) (Oral)   Ht _0  (1.778 m)   Wt 250 lb (113.4 kg)   SpO2 96%   BMI 35.87 kg/m  VS noted,  Constitutional: Pt appears in NAD HENT: Head: NCAT.  Right Ear: External ear normal.  Left Ear: External ear normal.  Eyes: . Pupils are equal, round, and reactive to light. Conjunctivae and EOM are normal Nose: without d/c or deformity Neck: Neck supple. Gross normal ROM Cardiovascular: Normal rate and regular rhythm.   Pulmonary/Chest: Effort normal and breath sounds without rales or wheezing.  Abd:  Soft, NT, ND, + BS, no organomegaly Neurological: Pt is alert. At baseline orientation, motor grossly intact Skin: Skin is warm. No rashes, other new lesions, no LE edema Psychiatric: Pt behavior is normal without agitation  All otherwise neg per pt Lab Results  Component Value Date   WBC 13.2 (H) 02/13/2018   HGB 15.3 02/13/2018   HCT 45.3 02/13/2018   PLT 351.0 02/13/2018   GLUCOSE 159 (H) 02/13/2018   CHOL 135 02/13/2018   TRIG (H) 02/13/2018    552.0 Triglyceride is over 400; calculations on Lipids are invalid.   HDL 29.30 (L) 02/13/2018  LDLDIRECT 53.0 02/13/2018   LDLCALC 79 11/19/2013   ALT 18 02/13/2018   AST 17 02/13/2018   NA 135 02/13/2018   K  4.2 02/13/2018   CL 99 02/13/2018   CREATININE 0.96 02/13/2018   BUN 11 02/13/2018   CO2 26 02/13/2018   TSH 0.45 02/13/2018   PSA 1.29 02/13/2018   INR 1.72 (H) 07/05/2010   HGBA1C 7.3 (H) 02/13/2018   MICROALBUR 0.8 02/13/2018      Assessment & Plan:

## 2019-08-06 NOTE — Patient Instructions (Addendum)
Please take all new medication as prescribed - the diflucan  Please continue all other medications as before, and refills have been done if requested.  Please have the pharmacy call with any other refills you may need.  Please continue your efforts at being more active, low cholesterol diet, and weight control.  You are otherwise up to date with prevention measures today.  Please keep your appointments with your specialists as you may have planned  You will be contacted regarding the referral for: Physical Therapy  Please go to the LAB at the blood drawing area for the tests to be done at the lab tomorrow  You will be contacted by phone if any changes need to be made immediately.  Otherwise, you will receive a letter about your results with an explanation, but please check with MyChart first.  Please remember to sign up for MyChart if you have not done so, as this will be important to you in the future with finding out test results, communicating by private email, and scheduling acute appointments online when needed.  Please make an Appointment to return in 6 months, or sooner if needed

## 2019-08-07 ENCOUNTER — Ambulatory Visit: Payer: Medicare HMO | Admitting: Podiatry

## 2019-08-07 ENCOUNTER — Other Ambulatory Visit (INDEPENDENT_AMBULATORY_CARE_PROVIDER_SITE_OTHER): Payer: Medicare HMO

## 2019-08-07 DIAGNOSIS — E119 Type 2 diabetes mellitus without complications: Secondary | ICD-10-CM | POA: Diagnosis not present

## 2019-08-07 DIAGNOSIS — Z0001 Encounter for general adult medical examination with abnormal findings: Secondary | ICD-10-CM | POA: Diagnosis not present

## 2019-08-07 DIAGNOSIS — E559 Vitamin D deficiency, unspecified: Secondary | ICD-10-CM | POA: Diagnosis not present

## 2019-08-07 DIAGNOSIS — L97512 Non-pressure chronic ulcer of other part of right foot with fat layer exposed: Secondary | ICD-10-CM

## 2019-08-07 DIAGNOSIS — Z114 Encounter for screening for human immunodeficiency virus [HIV]: Secondary | ICD-10-CM | POA: Diagnosis not present

## 2019-08-07 DIAGNOSIS — E0843 Diabetes mellitus due to underlying condition with diabetic autonomic (poly)neuropathy: Secondary | ICD-10-CM | POA: Diagnosis not present

## 2019-08-07 DIAGNOSIS — E538 Deficiency of other specified B group vitamins: Secondary | ICD-10-CM

## 2019-08-07 LAB — BASIC METABOLIC PANEL
BUN: 15 mg/dL (ref 6–23)
CO2: 29 mEq/L (ref 19–32)
Calcium: 9.1 mg/dL (ref 8.4–10.5)
Chloride: 97 mEq/L (ref 96–112)
Creatinine, Ser: 0.78 mg/dL (ref 0.40–1.50)
GFR: 126.45 mL/min (ref >60.00)
Glucose, Bld: 141 mg/dL — ABNORMAL HIGH (ref 70–99)
Potassium: 3.2 mEq/L — ABNORMAL LOW (ref 3.5–5.1)
Sodium: 138 mEq/L (ref 135–145)

## 2019-08-07 LAB — CBC WITH DIFFERENTIAL/PLATELET
Basophils Absolute: 0.1 10*3/uL (ref 0.0–0.1)
Basophils Relative: 0.7 % (ref 0.0–3.0)
Eosinophils Absolute: 0.1 10*3/uL (ref 0.0–0.7)
Eosinophils Relative: 0.4 % (ref 0.0–5.0)
HCT: 42.7 % (ref 39.0–52.0)
Hemoglobin: 13.9 g/dL (ref 13.0–17.0)
Lymphocytes Relative: 16.3 % (ref 12.0–46.0)
Lymphs Abs: 3.1 10*3/uL (ref 0.7–4.0)
MCHC: 32.6 g/dL (ref 30.0–36.0)
MCV: 96.6 fl (ref 78.0–100.0)
Monocytes Absolute: 2 10*3/uL — ABNORMAL HIGH (ref 0.1–1.0)
Monocytes Relative: 10.4 % (ref 3.0–12.0)
Neutro Abs: 13.8 10*3/uL — ABNORMAL HIGH (ref 1.4–7.7)
Neutrophils Relative %: 72.2 % (ref 43.0–77.0)
Platelets: 420 10*3/uL — ABNORMAL HIGH (ref 150.0–400.0)
RBC: 4.43 Mil/uL (ref 4.22–5.81)
RDW: 13.5 % (ref 11.5–15.5)
WBC: 19.1 10*3/uL (ref 4.0–10.5)

## 2019-08-07 LAB — HEPATIC FUNCTION PANEL
ALT: 16 U/L (ref 0–53)
AST: 17 U/L (ref 0–37)
Albumin: 4.1 g/dL (ref 3.5–5.2)
Alkaline Phosphatase: 45 U/L (ref 39–117)
Bilirubin, Direct: 0.1 mg/dL (ref 0.0–0.3)
Total Bilirubin: 0.3 mg/dL (ref 0.2–1.2)
Total Protein: 6.9 g/dL (ref 6.0–8.3)

## 2019-08-07 LAB — PSA: PSA: 13.95 ng/mL — ABNORMAL HIGH (ref 0.10–4.00)

## 2019-08-07 LAB — VITAMIN B12: Vitamin B-12: 173 pg/mL — ABNORMAL LOW (ref 211–911)

## 2019-08-07 LAB — VITAMIN D 25 HYDROXY (VIT D DEFICIENCY, FRACTURES): VITD: 12.71 ng/mL — ABNORMAL LOW (ref 30.00–100.00)

## 2019-08-07 LAB — TSH: TSH: 0.47 u[IU]/mL (ref 0.35–4.50)

## 2019-08-07 LAB — LIPID PANEL
Cholesterol: 149 mg/dL (ref 0–200)
HDL: 26.6 mg/dL — ABNORMAL LOW (ref >39.00)
LDL Cholesterol: 85 mg/dL (ref 0–99)
NonHDL: 122.11
Total CHOL/HDL Ratio: 6
Triglycerides: 188 mg/dL — ABNORMAL HIGH (ref 0.0–149.0)
VLDL: 37.6 mg/dL (ref 0.0–40.0)

## 2019-08-07 LAB — HEMOGLOBIN A1C: Hgb A1c MFr Bld: 7.3 % — ABNORMAL HIGH (ref 4.6–6.5)

## 2019-08-08 ENCOUNTER — Encounter: Payer: Self-pay | Admitting: Internal Medicine

## 2019-08-08 ENCOUNTER — Other Ambulatory Visit: Payer: Self-pay | Admitting: Internal Medicine

## 2019-08-08 LAB — URINALYSIS, ROUTINE W REFLEX MICROSCOPIC
Ketones, ur: NEGATIVE
Nitrite: NEGATIVE
Specific Gravity, Urine: 1.02 (ref 1.000–1.030)
Urine Glucose: NEGATIVE
Urobilinogen, UA: 1 (ref 0.0–1.0)
pH: 6.5 (ref 5.0–8.0)

## 2019-08-08 LAB — HIV ANTIBODY (ROUTINE TESTING W REFLEX): HIV 1&2 Ab, 4th Generation: NONREACTIVE

## 2019-08-08 MED ORDER — VITAMIN B-12 1000 MCG PO TABS
1000.0000 ug | ORAL_TABLET | Freq: Every day | ORAL | 1 refills | Status: DC
Start: 2019-08-08 — End: 2023-12-11

## 2019-08-08 MED ORDER — VITAMIN D (ERGOCALCIFEROL) 1.25 MG (50000 UNIT) PO CAPS: 50000.0000 [IU] | ORAL_CAPSULE | ORAL | 0 refills | Status: DC

## 2019-08-08 MED ORDER — CIPROFLOXACIN HCL 500 MG PO TABS
500.0000 mg | ORAL_TABLET | Freq: Two times a day (BID) | ORAL | 0 refills | Status: AC
Start: 2019-08-08 — End: 2019-08-18

## 2019-08-11 NOTE — Progress Notes (Signed)
   Subjective:  54 y.o. male with PMHx of diabetes mellitus presenting today for follow up evaluation of an ulceration of the right hallux. He states he is doing well. He denies any pain or modifying factors. He has been using Santyl as directed. Patient is here for further evaluation and treatment.   Past Medical History:  Diagnosis Date  . Acute pericarditis    admx 03-26-10 thru 03-29-10; a. echo 03-26-10: EF 55-60%; mild LVH; trivial MR; RVF ok; mild -mod circumferential Eff w/o tamponade  . Allergic rhinitis, cause unspecified 09/14/2012  . Allergy   . Anxiety   . Chronic back pain   . DDD (degenerative disc disease), lumbar 09/14/2012  . Depression   . Diabetes mellitus without complication (HCC)    borderline   . Erectile dysfunction 09/14/2012  . GERD (gastroesophageal reflux disease)   . Gout    pt denies  . Headache    migraines in the past  . History of pancreatitis    a. admx 04-2009.Marland KitchenMarland Kitchen? 2-2 triglycerides  . Hyperlipidemia 07/11/2013  . Hypertension   . Hypertriglyceridemia    a. followed by [facility]  . Left sided sciatica 09/14/2012  . Metabolic syndrome   . Obesity   . OSA (obstructive sleep apnea)    CPAP  . Restless legs       Objective/Physical Exam General: The patient is alert and oriented x3 in no acute distress.  Dermatology:  Wound #1 noted to the right hallux measuring approximately 1.0 x 1.0 x 0.1 cm (LxWxD).   To the noted ulceration(s), there is no eschar. There is a moderate amount of slough, fibrin, and necrotic tissue noted. Granulation tissue and wound base is red. There is a minimal amount of serosanguineous drainage noted. There is no exposed bone muscle-tendon ligament or joint. There is no malodor. Periwound integrity is intact. Skin is warm, dry and supple bilateral lower extremities.  Vascular: Palpable pedal pulses bilaterally. No edema or erythema noted. Capillary refill within normal limits.  Neurological: Epicritic and protective  threshold diminished bilaterally.   Musculoskeletal Exam: Range of motion within normal limits to all pedal and ankle joints bilateral. Muscle strength 5/5 in all groups bilateral.   Assessment: 1. 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 diabetes mellitus w/ peripheral neuropathy   Plan of Care:  1. Patient was evaluated. 2. medically necessary excisional debridement including subcutaneous tissue was performed using a tissue nipper and a chisel blade. Excisional debridement of all the necrotic nonviable tissue down to healthy bleeding viable tissue was performed with post-debridement measurements same as pre-. 3. the wound was cleansed and dry sterile dressing applied. 4. Continue using Santyl ointment daily.  5. Continue using post op shoe.  6. Return to clinic in 4 weeks.    Edrick Kins, DPM [facility]  Dr. Edrick Kins, [address]                                        [place], [place] [address]                Office [phone]  Fax [phone]

## 2019-08-29 ENCOUNTER — Telehealth: Payer: Self-pay | Admitting: *Deleted

## 2019-08-29 DIAGNOSIS — M545 Low back pain: Secondary | ICD-10-CM | POA: Diagnosis not present

## 2019-08-29 DIAGNOSIS — M79642 Pain in left hand: Secondary | ICD-10-CM | POA: Diagnosis not present

## 2019-08-29 MED ORDER — SANTYL 250 UNIT/GM EX OINT
1.0000 | TOPICAL_OINTMENT | Freq: Every day | CUTANEOUS | 5 refills | Status: DC
Start: 2019-08-29 — End: 2019-10-24

## 2019-08-29 NOTE — Telephone Encounter (Signed)
I spoke with [place] transferred to pharmacist - Thu and I informed of the right hallux ulcer measuring 1.0 x 1.0 x 0.1cm for 4 weeks therapy +5refills.

## 2019-08-29 NOTE — Telephone Encounter (Signed)
[facility] states they need number of wound, size and number of days to be used.

## 2019-09-03 DIAGNOSIS — M48061 Spinal stenosis, lumbar region without neurogenic claudication: Secondary | ICD-10-CM | POA: Diagnosis not present

## 2019-09-04 ENCOUNTER — Other Ambulatory Visit: Payer: Self-pay

## 2019-09-04 ENCOUNTER — Ambulatory Visit: Payer: Medicare HMO | Admitting: Podiatry

## 2019-09-04 ENCOUNTER — Encounter: Payer: Self-pay | Admitting: Podiatry

## 2019-09-04 DIAGNOSIS — L97512 Non-pressure chronic ulcer of other part of right foot with fat layer exposed: Secondary | ICD-10-CM

## 2019-09-04 DIAGNOSIS — E0843 Diabetes mellitus due to underlying condition with diabetic autonomic (poly)neuropathy: Secondary | ICD-10-CM

## 2019-09-04 DIAGNOSIS — E1142 Type 2 diabetes mellitus with diabetic polyneuropathy: Secondary | ICD-10-CM | POA: Diagnosis not present

## 2019-09-04 NOTE — Progress Notes (Signed)
   Subjective:  54 y.o. male with PMHx of diabetes mellitus presenting today for follow up evaluation of an ulceration of the right hallux.  Patient states that he is doing much better and he believes the ulcer is healed.  He has been applying the Santyl ointment with a Band-Aid as directed.  He is currently able to get back into his regular tennis shoes without pain.  He presents for follow-up treatment evaluation.  No new complaints at this time   Past Medical History:  Diagnosis Date  . Acute pericarditis    admx 03-26-10 thru 03-29-10; a. echo 03-26-10: EF 55-60%; mild LVH; trivial MR; RVF ok; mild -mod circumferential Eff w/o tamponade  . Allergic rhinitis, cause unspecified 09/14/2012  . Allergy   . Anxiety   . Chronic back pain   . DDD (degenerative disc disease), lumbar 09/14/2012  . Depression   . Diabetes mellitus without complication (HCC)    borderline   . Erectile dysfunction 09/14/2012  . GERD (gastroesophageal reflux disease)   . Gout    pt denies  . Headache    migraines in the past  . History of pancreatitis    a. admx 04-2009.Marland KitchenMarland Kitchen? 2-2 triglycerides  . Hyperlipidemia 07/11/2013  . Hypertension   . Hypertriglyceridemia    a. followed by [facility]  . Left sided sciatica 09/14/2012  . Metabolic syndrome   . Obesity   . OSA (obstructive sleep apnea)    CPAP  . Restless legs       Objective/Physical Exam General: The patient is alert and oriented x3 in no acute distress.  Dermatology:  Wound noted to the distal tip of the right hallux has healed.  Complete reepithelialization has occurred.  No open wound noted.  No drainage.  Vascular: Palpable pedal pulses bilaterally. No edema or erythema noted. Capillary refill within normal limits.  Neurological: Epicritic and protective threshold diminished bilaterally.   Musculoskeletal Exam: Range of motion within normal limits to all pedal and ankle joints bilateral. Muscle strength 5/5 in all groups bilateral.    Assessment: 1. 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 diabetes mellitus w/ peripheral neuropathy   Plan of Care:  1. Patient was evaluated. 2.  Light debridement was performed of the callus tissue around the healed wound site. 3.  Continue antibiotic cream and a Band-Aid x1 week 4.  Discontinue postoperative shoe.  Recommend good supportive tennis shoes 5.  Return to clinic as needed  Edrick Kins, DPM [facility]  Dr. Edrick Kins, [address]                                        [place], [place] [address]                Office [phone]  Fax [phone]

## 2019-09-05 ENCOUNTER — Other Ambulatory Visit: Payer: Self-pay | Admitting: Anesthesiology

## 2019-09-05 ENCOUNTER — Telehealth: Payer: Self-pay | Admitting: Internal Medicine

## 2019-09-05 DIAGNOSIS — G8929 Other chronic pain: Secondary | ICD-10-CM

## 2019-09-05 DIAGNOSIS — M48061 Spinal stenosis, lumbar region without neurogenic claudication: Secondary | ICD-10-CM

## 2019-09-05 DIAGNOSIS — M549 Dorsalgia, unspecified: Secondary | ICD-10-CM

## 2019-09-05 NOTE — Telephone Encounter (Signed)
New message:   Claim ID #: [id number]  [employer] [employer] is calling and states they are needing notes from the pt's 08/06/19 office visit. She states that the pt had a mobile exam on this visit for a hoveround chair. She states she also needs an order from the provider. Please advise.

## 2019-09-06 NOTE — Telephone Encounter (Signed)
Request for a written order for the Hover Round was sent over to Dr. Jenny Reichmann for further advise.

## 2019-09-06 NOTE — Telephone Encounter (Signed)
Done hardcopy to Harold [name] 

## 2019-09-10 NOTE — Telephone Encounter (Signed)
Already faxed x4 days ago.

## 2019-09-16 ENCOUNTER — Other Ambulatory Visit: Payer: Self-pay | Admitting: Internal Medicine

## 2019-09-16 NOTE — Telephone Encounter (Signed)
Please refill as per office routine med refill policy (all routine meds refilled for 3 mo or monthly per pt preference up to one year from last visit, then month to month grace period for 3 mo, then further med refills will have to be denied)  

## 2019-09-20 ENCOUNTER — Encounter: Payer: Self-pay | Admitting: Internal Medicine

## 2019-09-20 DIAGNOSIS — E1140 Type 2 diabetes mellitus with diabetic neuropathy, unspecified: Secondary | ICD-10-CM

## 2019-09-20 DIAGNOSIS — M25561 Pain in right knee: Secondary | ICD-10-CM | POA: Insufficient documentation

## 2019-09-20 HISTORY — DX: Type 2 diabetes mellitus with diabetic neuropathy, unspecified: E11.40

## 2019-09-20 NOTE — Telephone Encounter (Signed)
Paperwork faxed to [phone]

## 2019-09-20 NOTE — Telephone Encounter (Signed)
Order printed for PCP to sign. 

## 2019-09-20 NOTE — Addendum Note (Signed)
Addended by: Karle Barr on: 09/20/2019 08:47 AM   Modules accepted: Orders

## 2019-09-23 ENCOUNTER — Other Ambulatory Visit: Payer: Self-pay | Admitting: Internal Medicine

## 2019-09-23 DIAGNOSIS — M65352 Trigger finger, left little finger: Secondary | ICD-10-CM | POA: Diagnosis not present

## 2019-09-23 DIAGNOSIS — M65342 Trigger finger, left ring finger: Secondary | ICD-10-CM | POA: Diagnosis not present

## 2019-09-23 NOTE — Telephone Encounter (Signed)
Please refill as per office routine med refill policy (all routine meds refilled for 3 mo or monthly per pt preference up to one year from last visit, then month to month grace period for 3 mo, then further med refills will have to be denied)  

## 2019-09-27 NOTE — Telephone Encounter (Signed)
It's not in my folder, so it should be in the stack of faxes I gave you the other day.

## 2019-09-27 NOTE — Telephone Encounter (Signed)
refaxing note now.

## 2019-09-27 NOTE — Telephone Encounter (Signed)
   Please re-fax  Paperwork. Pages were not clear and cut off

## 2019-10-02 ENCOUNTER — Encounter: Payer: Self-pay | Admitting: Internal Medicine

## 2019-10-02 MED ORDER — AZITHROMYCIN 250 MG PO TABS: ORAL_TABLET | ORAL | 0 refills | Status: DC

## 2019-10-04 ENCOUNTER — Ambulatory Visit
Admission: RE | Admit: 2019-10-04 | Discharge: 2019-10-04 | Disposition: A | Discharge status: Home / Self Care | Payer: Medicare HMO | Source: Ambulatory Visit | Attending: Anesthesiology | Admitting: Anesthesiology

## 2019-10-04 ENCOUNTER — Other Ambulatory Visit: Payer: Self-pay

## 2019-10-04 DIAGNOSIS — M48061 Spinal stenosis, lumbar region without neurogenic claudication: Secondary | ICD-10-CM

## 2019-10-08 NOTE — Telephone Encounter (Signed)
Needs ROV since he already had an antibix rx on July 7

## 2019-10-08 NOTE — Telephone Encounter (Signed)
I was able to schedule pt to come in on Thursday at 4:00.

## 2019-10-09 NOTE — Telephone Encounter (Signed)
Paperwork has  been faxed. 10/09/2019

## 2019-10-10 ENCOUNTER — Other Ambulatory Visit: Payer: Self-pay

## 2019-10-10 ENCOUNTER — Ambulatory Visit (INDEPENDENT_AMBULATORY_CARE_PROVIDER_SITE_OTHER): Payer: Medicare HMO | Admitting: Internal Medicine

## 2019-10-10 DIAGNOSIS — J309 Allergic rhinitis, unspecified: Secondary | ICD-10-CM

## 2019-10-10 DIAGNOSIS — I10 Essential (primary) hypertension: Secondary | ICD-10-CM

## 2019-10-10 DIAGNOSIS — N529 Male erectile dysfunction, unspecified: Secondary | ICD-10-CM

## 2019-10-10 DIAGNOSIS — J0140 Acute pansinusitis, unspecified: Secondary | ICD-10-CM | POA: Diagnosis not present

## 2019-10-10 MED ORDER — METHYLPREDNISOLONE ACETATE 80 MG/ML IJ SUSP
80.0000 mg | Freq: Once | INTRAMUSCULAR | Status: AC
  Administered 2019-10-10: 80 mg via INTRAMUSCULAR

## 2019-10-10 MED ORDER — PREDNISONE 10 MG PO TABS: ORAL_TABLET | ORAL | 0 refills | Status: DC

## 2019-10-10 MED ORDER — LEVOFLOXACIN 500 MG PO TABS
500.0000 mg | ORAL_TABLET | Freq: Every day | ORAL | 0 refills | Status: AC
Start: 2019-10-10 — End: 2019-10-20

## 2019-10-10 MED ORDER — TADALAFIL 20 MG PO TABS
20.0000 mg | ORAL_TABLET | Freq: Every day | ORAL | 11 refills | Status: DC | PRN
Start: 2019-10-10 — End: 2019-10-24

## 2019-10-10 NOTE — Progress Notes (Signed)
Subjective:    Patient ID: Harold [name], male    DOB: Sep 29, 1965, 54 y.o.   MRN: [id number]  HPI   Here with 2-3 days acute onset fever, facial pain, pressure, headache, general weakness and malaise, and greenish d/c, with mild ST and cough, but pt denies chest pain, wheezing, increased sob or doe, orthopnea, PND, increased LE swelling, palpitations, dizziness or syncope.  Does have several wks ongoing nasal allergy symptoms with clearish congestion, itch and sneezing, without fever, pain, ST, cough, swelling or wheezing  Pt denies chest pain, increased sob or doe, wheezing, orthopnea, PND, increased LE swelling, palpitations, dizziness or syncope.  Pt denies new neurological symptoms such as new headache, or facial or extremity weakness or numbness   Pt denies polydipsia, polyuria,\ Past Medical History:  Diagnosis Date  . Acute pericarditis    admx 03-26-10 thru 03-29-10; a. echo 03-26-10: EF 55-60%; mild LVH; trivial MR; RVF ok; mild -mod circumferential Eff w/o tamponade  . Allergic rhinitis, cause unspecified 09/14/2012  . Allergy   . Anxiety   . Chronic back pain   . DDD (degenerative disc disease), lumbar 09/14/2012  . Depression   . Diabetes mellitus without complication (HCC)    borderline   . Erectile dysfunction 09/14/2012  . GERD (gastroesophageal reflux disease)   . Gout    pt denies  . Headache    migraines in the past  . History of pancreatitis    a. admx 04-2009.Marland KitchenMarland Kitchen? 2-2 triglycerides  . Hyperlipidemia 07/11/2013  . Hypertension   . Hypertriglyceridemia    a. followed by [facility]  . Left sided sciatica 09/14/2012  . Metabolic syndrome   . Obesity   . OSA (obstructive sleep apnea)    CPAP  . Restless legs    Past Surgical History:  Procedure Laterality Date  . COLONOSCOPY    . KNEE SURGERY     right  . LUMBAR LAMINECTOMY/DECOMPRESSION MICRODISCECTOMY N/A 08/26/2015   Procedure: Lumbar Two-Sacral One  Laminectomy for decompression;  Surgeon: Kevan Ny  Ditty, MD;  Location: [facility] [facility];  Service: Neurosurgery;  Laterality: N/A;  . pericardectomy  07/15/2010   Dr. [name]  . pleurx catheter placement  07/15/2010   Dr. [name]  . SHOULDER SURGERY     right and left shoulders  . TONSILLECTOMY AND ADENOIDECTOMY     one tonsil    reports that he has been smoking cigarettes. He has a 10.00 pack-year smoking history. He has never used smokeless tobacco. He reports current alcohol use of about 2.0 standard drinks of alcohol per week. He reports that he does not use drugs. family history includes Colon cancer (age of onset: 68) in his father; Diabetes in his father, mother, and sister; Heart disease (age of onset: 34) in his father; Hypertension in an other family member; Liver cancer in his paternal uncle; Pancreatitis in his father and paternal uncle; Stomach cancer in his maternal grandfather. Allergies  Allergen Reactions  . Other Itching, Swelling and Palpitations    Pecans: itching and swelling of the tongue   . Diphenhydramine Itching, Palpitations and Other (See Comments)    "jittery"  . Colchicine Diarrhea  . Lipitor [Atorvastatin] Other (See Comments)    Muscle cramps  . Septra [Sulfamethoxazole-Trimethoprim] Itching   Current Outpatient Medications on File Prior to Visit  Medication Sig Dispense Refill  . amLODipine (NORVASC) 10 MG tablet TAKE ONE TABLET BY MOUTH DAILY 90 tablet 0  . ammonium lactate (AMLACTIN) 12 % lotion Apply  1 application topically as needed for dry skin. 400 g 3  . aspirin EC 81 MG tablet Take 81 mg by mouth daily.    [name] azithromycin (ZITHROMAX) 250 MG tablet 2 tab by mouth day 1, then 1 per day 6 tablet 0  . Blood Glucose Monitoring Suppl (ONE TOUCH ULTRA 2) w/Device KIT Use as directed daily E11.9 1 each 0  . cetirizine (ZYRTEC) 5 MG tablet Take 5 mg by mouth daily as needed for allergies.     . citalopram (CELEXA) 40 MG tablet TAKE ONE TABLET BY MOUTH DAILY 90 tablet 2  . collagenase (SANTYL) ointment  Apply 1 application topically daily. 30 g 5  . diclofenac Sodium (VOLTAREN) 1 % GEL     . fenofibrate (TRICOR) 145 MG tablet TAKE ONE TABLET BY MOUTH DAILY 90 tablet 2  . fentaNYL (DURAGESIC - DOSED MCG/HR) 50 MCG/HR Place 50 mcg onto the skin every 3 (three) days.    . fluconazole (DIFLUCAN) 100 MG tablet 1 tablet daily by mouth 7 tablet 0  . furosemide (LASIX) 40 MG tablet TAKE ONE TABLET BY MOUTH DAILY 90 tablet 3  . gabapentin (NEURONTIN) 300 MG capsule Take 900 mg by mouth 3 (three) times daily.    [name] glucose blood test strip Use as instructed once daily E119 100 each 12  . ibuprofen (ADVIL,MOTRIN) 800 MG tablet Take 800 mg by mouth every 8 (eight) hours as needed.    . Lancets MISC Use as directed daily E11.9 100 each 11  . lisinopril (ZESTRIL) 40 MG tablet TAKE ONE TABLET BY MOUTH DAILY 90 tablet 2  . metFORMIN (GLUCOPHAGE-XR) 500 MG 24 hr tablet TAKE TWO TABLETS BY MOUTH DAILY WITH BREAKFAST 180 tablet 2  . OVER THE COUNTER MEDICATION Take 500 mg by mouth daily.    [name] oxyCODONE-acetaminophen (PERCOCET) 10-325 MG tablet Take 1 tablet by mouth every 6 (six) hours as needed for pain. 90 tablet 0  . Potassium Chloride ER 20 MEQ TBCR TAKE ONE TABLET BY MOUTH DAILY 90 tablet 2  . rosuvastatin (CRESTOR) 20 MG tablet TAKE ONE TABLET BY MOUTH DAILY 90 tablet 0  . sildenafil (VIAGRA) 100 MG tablet Take 0.5-1 tablets (50-100 mg total) by mouth daily as needed for erectile dysfunction. 5 tablet 11  . terbinafine (LAMISIL AT) 1 % cream Apply 1 application topically daily as needed (foot fungus). 42 g 0  . terbinafine (LAMISIL) 250 MG tablet Take 1 tablet (250 mg total) by mouth daily. 90 tablet 1  . tiZANidine (ZANAFLEX) 4 MG tablet     . tolterodine (DETROL) 2 MG tablet TAKE 1 TABLET TWICE DAILY 180 tablet 1  . triamcinolone (NASACORT) 55 MCG/ACT AERO nasal inhaler Place 2 sprays into the nose daily. 1 Inhaler 12  . vitamin B-12 (CYANOCOBALAMIN) 1000 MCG tablet Take 1 tablet (1,000 mcg total) by  mouth daily. 90 tablet 1  . Vitamin D, Ergocalciferol, (DRISDOL) 1.25 MG (50000 UNIT) CAPS capsule Take 1 capsule (50,000 Units total) by mouth every 7 (seven) days. 12 capsule 0  . VOLTAREN 1 % GEL Apply 2 g topically 4 (four) times daily. 100 g 5   No current facility-administered medications on file prior to visit.   Review of Systems All otherwise neg per pt     Objective:   Physical Exam There were no vitals taken for this visit. VS noted,  Constitutional: Pt appears in NAD HENT: Head: NCAT.  Right Ear: External ear normal.  Left Ear: External ear normal.  Bilat tm's with mild erythema.  Max sinus areas mild tender.  Pharynx with mild erythema, no exudate Eyes: . Pupils are equal, round, and reactive to light. Conjunctivae and EOM are normal Nose: without d/c or deformity Neck: Neck supple. Gross normal ROM Cardiovascular: Normal rate and regular rhythm.   Pulmonary/Chest: Effort normal and breath sounds without rales or wheezing.  Abd:  Soft, NT, ND, + BS, no organomegaly Neurological: Pt is alert. At baseline orientation, motor grossly intact Skin: Skin is warm. No rashes, other new lesions, no LE edema Psychiatric: Pt behavior is normal without agitation  All otherwise neg per pt Lab Results  Component Value Date   WBC 19.1 Repeated and verified X2. (HH) 08/07/2019   HGB 13.9 08/07/2019   HCT 42.7 08/07/2019   PLT 420.0 (H) 08/07/2019   GLUCOSE 141 (H) 08/07/2019   CHOL 149 08/07/2019   TRIG 188.0 (H) 08/07/2019   HDL 26.60 (L) 08/07/2019   LDLDIRECT 53.0 02/13/2018   LDLCALC 85 08/07/2019   ALT 16 08/07/2019   AST 17 08/07/2019   NA 138 08/07/2019   K 3.2 (L) 08/07/2019   CL 97 08/07/2019   CREATININE 0.78 08/07/2019   BUN 15 08/07/2019   CO2 29 08/07/2019   TSH 0.47 08/07/2019   PSA 13.95 (H) 08/07/2019   INR 1.72 (H) 07/05/2010   HGBA1C 7.3 (H) 08/07/2019   MICROALBUR 0.8 02/13/2018         Assessment & Plan:

## 2019-10-10 NOTE — Patient Instructions (Signed)
You had the steroid shot today  Please take all new medication as prescribed  - the antibiotic, prednisone, and cialis as needed  Please continue all other medications as before, and refills have been done if requested.  Please have the pharmacy call with any other refills you may need.  Please keep your appointments with your specialists as you may have planned

## 2019-10-11 ENCOUNTER — Ambulatory Visit: Payer: Medicare HMO | Admitting: Internal Medicine

## 2019-10-12 ENCOUNTER — Encounter: Payer: Self-pay | Admitting: Internal Medicine

## 2019-10-12 NOTE — Assessment & Plan Note (Addendum)
Mild to mod, for antibx course,  to f/u any worsening symptoms or concerns  I spent 31 minutes in preparing to see the patient by review of recent labs, imaging and procedures, obtaining and reviewing separately obtained history, communicating with the patient and family or caregiver, ordering medications, tests or procedures, and documenting clinical information in the EHR including the differential Dx, treatment, and any further evaluation and other management of sinusitis, allergies, htn, eD

## 2019-10-12 NOTE — Assessment & Plan Note (Signed)
Pt requests change to cialis prn,  to f/u any worsening symptoms or concerns

## 2019-10-12 NOTE — Assessment & Plan Note (Signed)
Did not take bP med today, encouraged compliance

## 2019-10-12 NOTE — Assessment & Plan Note (Signed)
Mild to mod, for depomedrol im 80, predpac asd,  to f/u any worsening symptoms or concerns 

## 2019-10-16 ENCOUNTER — Other Ambulatory Visit: Payer: Self-pay | Admitting: Internal Medicine

## 2019-10-16 NOTE — Telephone Encounter (Signed)
Please refill as per office routine med refill policy (all routine meds refilled for 3 mo or monthly per pt preference up to one year from last visit, then month to month grace period for 3 mo, then further med refills will have to be denied)  

## 2019-10-17 DIAGNOSIS — M961 Postlaminectomy syndrome, not elsewhere classified: Secondary | ICD-10-CM | POA: Diagnosis not present

## 2019-10-17 DIAGNOSIS — M5126 Other intervertebral disc displacement, lumbar region: Secondary | ICD-10-CM | POA: Diagnosis not present

## 2019-10-17 DIAGNOSIS — R2689 Other abnormalities of gait and mobility: Secondary | ICD-10-CM | POA: Diagnosis not present

## 2019-10-17 DIAGNOSIS — G894 Chronic pain syndrome: Secondary | ICD-10-CM | POA: Diagnosis not present

## 2019-10-17 DIAGNOSIS — M47817 Spondylosis without myelopathy or radiculopathy, lumbosacral region: Secondary | ICD-10-CM | POA: Diagnosis not present

## 2019-10-17 DIAGNOSIS — G9009 Other idiopathic peripheral autonomic neuropathy: Secondary | ICD-10-CM | POA: Diagnosis not present

## 2019-10-17 DIAGNOSIS — M545 Low back pain: Secondary | ICD-10-CM | POA: Diagnosis not present

## 2019-10-22 ENCOUNTER — Other Ambulatory Visit: Payer: Self-pay | Admitting: Internal Medicine

## 2019-10-22 ENCOUNTER — Encounter: Payer: Self-pay | Admitting: Internal Medicine

## 2019-10-23 MED ORDER — FLUCONAZOLE 100 MG PO TABS: ORAL_TABLET | ORAL | 0 refills | Status: DC

## 2019-10-24 ENCOUNTER — Ambulatory Visit: Payer: Medicare HMO | Admitting: Cardiovascular Disease

## 2019-10-24 ENCOUNTER — Other Ambulatory Visit: Payer: Self-pay

## 2019-10-24 ENCOUNTER — Encounter: Payer: Self-pay | Admitting: Cardiovascular Disease

## 2019-10-24 VITALS — BP 130/80 | HR 74 | Ht 71.0 in | Wt 258.6 lb

## 2019-10-24 DIAGNOSIS — I10 Essential (primary) hypertension: Secondary | ICD-10-CM | POA: Diagnosis not present

## 2019-10-24 DIAGNOSIS — Z6836 Body mass index (BMI) 36.0-36.9, adult: Secondary | ICD-10-CM | POA: Diagnosis not present

## 2019-10-24 DIAGNOSIS — E785 Hyperlipidemia, unspecified: Secondary | ICD-10-CM

## 2019-10-24 DIAGNOSIS — E118 Type 2 diabetes mellitus with unspecified complications: Secondary | ICD-10-CM | POA: Diagnosis not present

## 2019-10-24 DIAGNOSIS — G4733 Obstructive sleep apnea (adult) (pediatric): Secondary | ICD-10-CM

## 2019-10-24 NOTE — Patient Instructions (Signed)
Medication Instructions:  CONTINUE WITH CURRENT MEDICATIONS. NO CHANGES.  *If you need a refill on your cardiac medications before your next appointment, please call your pharmacy*    Testing/Procedures: bipap titration study-- our sleep coordinator will contact you to schedule   Follow-Up: At [facility], you and your health needs are our priority.  As part of our continuing mission to provide you with exceptional heart care, we have created designated Provider Care Teams.  These Care Teams include your primary Cardiologist (physician) and Advanced Practice Providers (APPs -  Physician Assistants and Nurse Practitioners) who all work together to provide you with the care you need, when you need it.  We recommend signing up for the patient portal called "MyChart".  Sign up information is provided on this After Visit Summary.  MyChart is used to connect with patients for Virtual Visits (Telemedicine).  Patients are able to view lab/test results, encounter notes, upcoming appointments, etc.  Non-urgent messages can be sent to your provider as well.   To learn more about what you can do with MyChart, go to [email or web address].    Your next appointment:   2-3 month(s)  The format for your next appointment:   In Person  Provider:   Shelva Majestic, MD

## 2019-10-29 ENCOUNTER — Encounter: Payer: Self-pay | Admitting: Cardiovascular Disease

## 2019-10-29 NOTE — Progress Notes (Signed)
Cardiology Office Note    Date:  10/29/2019   ID:  Harold [name], DOB [birth year], MRN [id number]  PCP:  Biagio Borg, MD  Cardiologist:  Shelva Majestic, MD   New sleep evaluation.  History of Present Illness:  Harold [name] is a 54 y.o. male presents for initial sleep evaluation with me.  Harold [name] has a remote history of pericarditis as well as a history of diabetes mellitus, obesity, hypertension and hyperlipidemia.  Most recently he has been on a hypertensive regimen consisting of amlodipine 10 mg, furosemide 40 mg, lisinopril 40 mg daily.  He is on rosuvastatin 20 mg and fenofibrate for hyperlipidemia.  He is diabetic on metformin.  Only, in 2012 he had seen Harold [name].  Prior to that evaluation he had been in the hospital earlier in the year for pericarditis and had been noted to have significant apnea at the time of his cardiac catheterization.  He was ultimately discharged on BiPAP until he can have a formal sleep study.  Apparently this was done and showed severe obstructive sleep apnea with an AHI of 78/h.  Apnea was complex with both central and obstructive events.  When he saw Harold [name] in May 2012 he was using BiPAP every night and had no issues with mask pressure.  He was sleeping better.    Harold [name] has not seen a sleep physician since.  Apparently he had stopped using therapy over the years.  He was referred for a sleep study in 2019 which again confirmed severe sleep apnea with an AHI of 69.6/h and significant oxygen desaturation to 77% during sleep.  Of note, he had extremely severe sleep apnea in the supine position with an AHI of 142.9/h.  There was moderate snoring.  Apparently, he had never had CPAP type duration following his diagnostic study and was never seen for sleep evaluation due to restrictions from the Covid pandemic.  He had recently seen Dr. Percival [name] in April 2021 and at that time it was recommended he see me for evaluation.  Presently, patient has  not been sleeping well.  He does snore.  He admits to nocturia several times per night.  He cannot sleep on his back.  An Epworth Sleepiness Scale score was calculated in the office today and this endorsed at 9.  He is unaware of any restless legs, hypnagogic hallucinations or cataplectic events.  He presents for initial evaluation.  Past Medical History:  Diagnosis Date  . Acute idiopathic pericarditis 04/08/2010   Qualifier: Diagnosis of  By: Harold [name], Harold [name]    . Acute pericarditis    admx 03-26-10 thru 03-29-10; a. echo 03-26-10: EF 55-60%; mild LVH; trivial MR; RVF ok; mild -mod circumferential Eff w/o tamponade  . Acute sinusitis 06/04/2014  . Allergic rhinitis, cause unspecified 09/14/2012  . Allergy   . Anxiety   . CHF (congestive heart failure) ([place]) 06/29/2010  . Chronic back pain   . Chronic low back pain 08/06/2019  . 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 DDD (degenerative disc disease), lumbar 09/14/2012  . Depression   . Diabetes mellitus without complication (HCC)    borderline   . Edema 06/10/2010   Qualifier: Diagnosis of  By: Harold Spanish, MD, Harold [name]    . Erectile dysfunction 09/14/2012  . Essential hypertension 03/11/2008   Qualifier: Diagnosis of  By: Harold Mocha MD, Harold [name]    . GERD (gastroesophageal reflux disease)   . Gout    pt denies  . Gout, unspecified  05/01/2007   Qualifier: Diagnosis of  By: Harold Mocha MD, Harold [name] Headache    migraines in the past  . History of pancreatitis    a. admx 04-2009.Harold KitchenMarland [name]? 2-2 triglycerides  . Hyperlipidemia 07/11/2013  . Hypertension   . Hypertriglyceridemia    a. followed by [facility]  . HYPERTRIGLYCERIDEMIA 05/26/2009   Qualifier: Diagnosis of  By: Harold Mocha MD, Harold [name]   . Left sided sciatica 09/14/2012  . Metabolic syndrome   . METABOLIC SYNDROME X 8/59/2924   Qualifier: Diagnosis of  By: [name]    . Nocturia 02/17/2015  . NONSPECIFIC ABNORMAL ELECTROCARDIOGRAM 04/08/2010   Qualifier: Diagnosis of  By: Harold Miller, RN, BSN,  Harold [name]    . Obesity   . OSA (obstructive sleep apnea)    CPAP  . Restless legs   . Type 2 diabetes mellitus (Harold [name]) 03/04/2015  . Type 2 diabetes mellitus with diabetic neuropathy, unspecified (Harold [name]) 09/20/2019  . Urinary frequency 07/24/2015    Past Surgical History:  Procedure Laterality Date  . COLONOSCOPY    . KNEE SURGERY     right  . LUMBAR LAMINECTOMY/DECOMPRESSION MICRODISCECTOMY N/A 08/26/2015   Procedure: Lumbar Two-Sacral One  Laminectomy for decompression;  Surgeon: Harold Ny Ditty, MD;  Location: [facility];  Service: Neurosurgery;  Laterality: N/A;  . pericardectomy  07/15/2010   Dr. Servando [name]  . pleurx catheter placement  07/15/2010   Dr. Servando [name]  . SHOULDER SURGERY     right and left shoulders  . TONSILLECTOMY AND ADENOIDECTOMY     one tonsil    Current Medications: Outpatient Medications Prior to Visit  Medication Sig Dispense Refill  . amLODipine (NORVASC) 10 MG tablet TAKE ONE TABLET BY MOUTH DAILY 90 tablet 0  . ammonium lactate (AMLACTIN) 12 % lotion Apply 1 application topically as needed for dry skin. 400 g 3  . aspirin EC 81 MG tablet Take 81 mg by mouth daily.    . Blood Glucose Monitoring Suppl (ONE TOUCH ULTRA 2) w/Device KIT Use as directed daily E11.9 1 each 0  . citalopram (CELEXA) 40 MG tablet TAKE ONE TABLET BY MOUTH DAILY 90 tablet 2  . diclofenac Sodium (VOLTAREN) 1 % GEL     . fenofibrate (TRICOR) 145 MG tablet TAKE ONE TABLET BY MOUTH DAILY 90 tablet 2  . fentaNYL (DURAGESIC - DOSED MCG/HR) 50 MCG/HR Place 50 mcg onto the skin every 3 (three) days.    . fluconazole (DIFLUCAN) 100 MG tablet 1 tablet daily by mouth 7 tablet 0  . furosemide (LASIX) 40 MG tablet TAKE ONE TABLET BY MOUTH DAILY 90 tablet 3  . gabapentin (NEURONTIN) 300 MG capsule Take 900 mg by mouth 3 (three) times daily.    Harold [name] glucose blood test strip Use as instructed once daily E119 100 each 12  . ibuprofen (ADVIL,MOTRIN) 800 MG tablet Take 800 mg by mouth every 8 (eight) hours  as needed.    . Lancets MISC Use as directed daily E11.9 100 each 11  . lisinopril (ZESTRIL) 40 MG tablet TAKE ONE TABLET BY MOUTH DAILY 90 tablet 2  . metFORMIN (GLUCOPHAGE-XR) 500 MG 24 hr tablet TAKE TWO TABLETS BY MOUTH DAILY WITH BREAKFAST 180 tablet 2  . OVER THE COUNTER MEDICATION Take 500 mg by mouth daily.    Harold [name] oxyCODONE-acetaminophen (PERCOCET) 10-325 MG tablet Take 1 tablet by mouth every 6 (six) hours as needed for pain. 90 tablet 0  . Potassium Chloride ER 20 MEQ TBCR TAKE ONE  TABLET BY MOUTH DAILY 90 tablet 2  . rosuvastatin (CRESTOR) 20 MG tablet TAKE ONE TABLET BY MOUTH DAILY 90 tablet 0  . sildenafil (VIAGRA) 100 MG tablet Take 0.5-1 tablets (50-100 mg total) by mouth daily as needed for erectile dysfunction. 5 tablet 11  . terbinafine (LAMISIL AT) 1 % cream Apply 1 application topically daily as needed (foot fungus). 42 g 0  . terbinafine (LAMISIL) 250 MG tablet Take 1 tablet (250 mg total) by mouth daily. 90 tablet 1  . tiZANidine (ZANAFLEX) 4 MG tablet     . tolterodine (DETROL) 2 MG tablet TAKE 1 TABLET TWICE DAILY 180 tablet 1  . triamcinolone (NASACORT) 55 MCG/ACT AERO nasal inhaler Place 2 sprays into the nose daily. 1 Inhaler 12  . vitamin B-12 (CYANOCOBALAMIN) 1000 MCG tablet Take 1 tablet (1,000 mcg total) by mouth daily. 90 tablet 1  . Vitamin D, Ergocalciferol, (DRISDOL) 1.25 MG (50000 UNIT) CAPS capsule Take 1 capsule (50,000 Units total) by mouth every 7 (seven) days. 12 capsule 0  . VOLTAREN 1 % GEL Apply 2 g topically 4 (four) times daily. 100 g 5  . azithromycin (ZITHROMAX) 250 MG tablet 2 tab by mouth day 1, then 1 per day 6 tablet 0  . cetirizine (ZYRTEC) 5 MG tablet Take 5 mg by mouth daily as needed for allergies.     . collagenase (SANTYL) ointment Apply 1 application topically daily. 30 g 5  . lisinopril (ZESTRIL) 20 MG tablet TAKE ONE TABLET BY MOUTH TWICE A DAY 180 tablet 0  . predniSONE (DELTASONE) 10 MG tablet 3 tabs by mouth per day for 3 days,2tabs  per day for 3 days,1tab per day for 3 days 18 tablet 0  . tadalafil (CIALIS) 20 MG tablet Take 1 tablet (20 mg total) by mouth daily as needed for erectile dysfunction. 10 tablet 11   No facility-administered medications prior to visit.     Allergies:   Other, Diphenhydramine, Colchicine, Lipitor [atorvastatin], and Septra [sulfamethoxazole-trimethoprim]   Social History   Socioeconomic History  . Marital status: Divorced    Spouse name: Not on file  . Number of children: 1  . Years of education: Not on file  . Highest education level: Not on file  Occupational History  . Occupation: disabled    Fish farm manager: UNEMPLOYED  Tobacco Use  . Smoking status: Current Every Day Smoker    Packs/day: 0.50    Years: 20.00    Pack years: 10.00    Types: Cigarettes  . Smokeless tobacco: Never Used  Vaping Use  . Vaping Use: Former  Substance and Sexual Activity  . Alcohol use: Yes    Alcohol/week: 2.0 standard drinks    Types: 2 Shots of liquor per week    Comment: occasional   . Drug use: No  . Sexual activity: Not on file  Other Topics Concern  . Not on file  Social History Narrative   Regular exercise- No   Rare caffeine use    Social Determinants of Health   Financial Resource Strain:   . Difficulty of Paying Living Expenses:   Food Insecurity:   . Worried About Charity fundraiser in the Last Year:   . Arboriculturist in the Last Year:   Transportation Needs:   . Film/video editor (Medical):   Harold [name] Lack of Transportation (Non-Medical):   Physical Activity:   . Days of Exercise per Week:   . Minutes of Exercise per Session:   Stress:   .  Feeling of Stress :   Social Connections:   . Frequency of Communication with Friends and Family:   . Frequency of Social Gatherings with Friends and Family:   . Attends Religious Services:   . Active Member of Clubs or Organizations:   . Attends Archivist Meetings:   Harold [name] Marital Status:      Family History:  The  patient's family history includes Colon cancer (age of onset: 78) in his father; Diabetes in his father, mother, and sister; Heart disease (age of onset: 29) in his father; Hypertension in an other family member; Liver cancer in his paternal uncle; Pancreatitis in his father and paternal uncle; Stomach cancer in his maternal grandfather.   ROS General: Negative; No fevers, chills, or night sweats;  HEENT: Negative; No changes in vision or hearing, sinus congestion, difficulty swallowing Pulmonary: Negative; No cough, wheezing, shortness of breath, hemoptysis Cardiovascular: Negative; No chest pain, presyncope, syncope, palpitations GI: Negative; No nausea, vomiting, diarrhea, or abdominal pain GU: Negative; No dysuria, hematuria, or difficulty voiding Musculoskeletal: Negative; no myalgias, joint pain, or weakness Hematologic/Oncology: Negative; no easy bruising, bleeding Endocrine: Negative; no heat/cold intolerance; no diabetes Neuro: Negative; no changes in balance, headaches Skin: Negative; No rashes or skin lesions Psychiatric: Negative; No behavioral problems, depression Sleep: Positive for severe OSA with previous complex sleep apnea noted over 10 years ago no bruxism, restless legs, hypnogognic hallucinations, no cataplexy Other comprehensive 14 point system review is negative.   PHYSICAL EXAM:   VS:  BP (!) 130/80 (BP Location: Left Arm, Patient Position: Sitting, Cuff Size: Large)   Pulse 74   Ht '5\' 11"'  (1.803 m)   Wt (!) 258 lb 9.6 oz (117.3 kg)   BMI 36.07 kg/m     Repeat blood pressure by me 128/82  Wt Readings from Last 3 Encounters:  10/24/19 (!) 258 lb 9.6 oz (117.3 kg)  08/06/19 250 lb (113.4 kg)  07/19/19 252 lb (114.3 kg)    General: Alert, oriented, no distress.  Skin: normal turgor, no rashes, warm and dry HEENT: Normocephalic, atraumatic. Pupils equal round and reactive to light; sclera anicteric; extraocular muscles intact; Nose without nasal septal  hypertrophy Mouth/Parynx benign; Mallinpatti scale 4 Neck: Thick neck no JVD, no carotid bruits; normal carotid upstroke Lungs: clear to ausculatation and percussion; no wheezing or rales Chest wall: without tenderness to palpitation Heart: PMI not displaced, RRR, s1 s2 normal, 1/6 systolic murmur, no diastolic murmur, no rubs, gallops, thrills, or heaves Abdomen: soft, nontender; no hepatosplenomehaly, BS+; abdominal aorta nontender and not dilated by palpation. Back: no CVA tenderness Pulses 2+ Musculoskeletal: full range of motion, normal strength, no joint deformities Extremities: no clubbing cyanosis or edema, Homan's sign negative  Neurologic: grossly nonfocal; Cranial nerves grossly wnl Psychologic: Normal mood and affect   Studies/Labs Reviewed:   EKG:  EKG is not  ordered today.  Personally reviewed his ECG from July 19, 2019 which shows normal sinus rhythm at 72 bpm with nonspecific ST changes and prolonged QTc interval.  Recent Labs: BMP Latest Ref Rng & Units 08/07/2019 02/13/2018 01/18/2017  Glucose 70 - 99 mg/dL 141(H) 159(H) 109(H)  BUN 6 - 23 mg/dL '15 11 14  ' Creatinine 0.40 - 1.50 mg/dL 0.78 0.96 1.18  Sodium 135 - 145 mEq/L 138 135 139  Potassium 3.5 - 5.1 mEq/L 3.2(L) 4.2 4.0  Chloride 96 - 112 mEq/L 97 99 102  CO2 19 - 32 mEq/L '29 26 28  ' Calcium 8.4 - 10.5 mg/dL 9.1 9.7  10.0     Hepatic Function Latest Ref Rng & Units 08/07/2019 02/13/2018 06/17/2016  Total Protein 6.0 - 8.3 g/dL 6.9 7.1 6.6  Albumin 3.5 - 5.2 g/dL 4.1 4.3 4.2  AST 0 - 37 U/L '17 17 22  ' ALT 0 - 53 U/L '16 18 19  ' Alk Phosphatase 39 - 117 U/L 45 93 88  Total Bilirubin 0.2 - 1.2 mg/dL 0.3 0.4 0.3  Bilirubin, Direct 0.0 - 0.3 mg/dL 0.1 0.1 0.1    CBC Latest Ref Rng & Units 08/07/2019 02/13/2018 08/25/2015  WBC 4.0 - 10.5 K/uL 19.1 Repeated and verified X2.([place]) 13.2(H) 10.5  Hemoglobin 13.0 - 17.0 g/dL 13.9 15.3 15.0  Hematocrit 39 - 52 % 42.7 45.3 46.0  Platelets 150 - 400 K/uL 420.0(H) 351.0  381   Lab Results  Component Value Date   MCV 96.6 08/07/2019   MCV 96.2 02/13/2018   MCV 94.5 08/25/2015   Lab Results  Component Value Date   TSH 0.47 08/07/2019   Lab Results  Component Value Date   HGBA1C 7.3 (H) 08/07/2019     BNP No results found for: BNP  ProBNP    Component Value Date/Time   PROBNP 443.0 (H) 07/02/2010 1626     Lipid Panel     Component Value Date/Time   CHOL 149 08/07/2019 1421   TRIG 188.0 (H) 08/07/2019 1421   HDL 26.60 (L) 08/07/2019 1421   CHOLHDL 6 08/07/2019 1421   VLDL 37.6 08/07/2019 1421   LDLCALC 85 08/07/2019 1421   LDLDIRECT 53.0 02/13/2018 1641     RADIOLOGY: MR LUMBAR SPINE WO CONTRAST  Result Date: 10/05/2019 CLINICAL DATA:  Low back pain radiating to both legs. EXAM: MRI LUMBAR SPINE WITHOUT CONTRAST TECHNIQUE: Multiplanar, multisequence MR imaging of the lumbar spine was performed. No intravenous contrast was administered. COMPARISON:  11/29/2017 FINDINGS: Segmentation:  5 lumbar type vertebral bodies. Alignment:  Normal Vertebrae:  Normal Conus medullaris and cauda equina: Conus extends to the L1 level. Conus and cauda equina appear normal. Paraspinal and other soft tissues: Normal Disc levels: No abnormality at L2-3 or above. Previous posterior decompression L2-3. L3-4: Previous posterior decompression. Broad-based disc herniation, similar or minimally enlarged when compared to the study of 2019. Moderate stenosis could be symptomatic. L4-5: Previous posterior decompression. Pronounced enlargement of a central disc herniation. Worsening of facet arthropathy with gaping, fluid-filled joints. Severe stenosis at this level likely to cause neural compression. The appearance would probably worsen with standing or flexion. L5-S1: Previous posterior decompression. No disc pathology. Mild facet hypertrophy. No compressive stenosis. IMPRESSION: L3-4: Previous posterior decompression. Slight enlargement of a broad-based disc herniation.  Spinal stenosis at this level that could cause neural compression on either or both sides. L4-5: Enlargement of a central to slightly right-sided disc herniation. Previous posterior decompression. Bilateral facet arthropathy with gaping, fluid-filled joints. Spinal stenosis likely to cause neural compression. This appearance could worsen with standing or flexion based on the morphology of the facet arthropathy. L5-S1: Previous posterior decompression. Mild facet osteoarthritis. No stenosis. Electronically Signed   By: Nelson Chimes M.D.   On: 10/05/2019 20:39     Additional studies/ records that were reviewed today include:   I reviewed the previous records from Dr. Lanny [place]st plants in 2012, as well as the patient's recent sleep study from November 17, 2017.  ASSESSMENT:    1. Obstructive sleep apnea treated with BiPAP   2. Essential hypertension   3. Dyslipidemia   4. Class 2 severe obesity due to  excess calories with serious comorbidity and body mass index (BMI) of 36.0 to 36.9 in adult ([place])   5. Hyperlipidemia, unspecified hyperlipidemia type   6. Type 2 diabetes mellitus with complication, without long-term current use of insulin [facility])     PLAN:  Harold [name] "Harold Joiner" [name] is a 54 year old African-American gentleman who has a history of obesity, remote history of pericarditis, hypertension, hyperlipidemia, as well as diabetes mellitus.  Apparently, over 10 years ago he had a sleep study and was told of having severe complex sleep apnea and initially had seen Dr. Humberto Seals in follow-up.  Apparently he was using a BiPAP machine at that time.  He ultimately was referred for a new sleep study in 2019 which again confirmed severe sleep apnea with an AHI of 69.6/h and events were significantly worse with supine posture with an AHI of 142.9/h.  He had severe oxygen desaturation to a nadir of 77% and time spent with O2 less than 88% was 68.3 minutes.  Apparently due to the subsequent Covid pandemic, he  never had a follow-up titration study nor a sleep evaluation.  He is not sleeping well.  He cannot sleep on his back.  He does snore and has nonrestorative sleep.  Has been on multiple drug regimen for hypertension control consisting of amlodipine 10 mg, furosemide 40 mg, and lisinopril 40 mg.  I had a long discussion with him today and reviewed his sleep study from his 2019 evaluation.  I discussed the effects of sleep apnea on normal sleep architecture and at length discussed its effects on cardiovascular health including hypertension, nocturnal arrhythmias, potential nocturnal ischemia due to hypoxemia in addition to its effects on glucose metabolism as well as GERD.  In the past he had been on BiPAP therapy.  I am recommending he undergo a reevaluation and hopefully he can just have BiPAP titration.  However if a slit night study is necessary I have recommended BiPAP titration for the second portion of the evaluation.  We will try to expedite this in light of the severity of his sleep apnea.  He tells me he will be needing to undergo low back surgery in the near future.  I will see him in follow-up of his study and new machine and further recommendations will be made at that time.   Medication Adjustments/Labs and Tests Ordered: Current medicines are reviewed at length with the patient today.  Concerns regarding medicines are outlined above.  Medication changes, Labs and Tests ordered today are listed in the Patient Instructions below. Patient Instructions  Medication Instructions:  CONTINUE WITH CURRENT MEDICATIONS. NO CHANGES.  *If you need a refill on your cardiac medications before your next appointment, please call your pharmacy*    Testing/Procedures: bipap titration study-- our sleep coordinator will contact you to schedule   Follow-Up: At [facility], you and your health needs are our priority.  As part of our continuing mission to provide you with exceptional heart care, we have  created designated Provider Care Teams.  These Care Teams include your primary Cardiologist (physician) and Advanced Practice Providers (APPs -  Physician Assistants and Nurse Practitioners) who all work together to provide you with the care you need, when you need it.  We recommend signing up for the patient portal called "MyChart".  Sign up information is provided on this After Visit Summary.  MyChart is used to connect with patients for Virtual Visits (Telemedicine).  Patients are able to view lab/test results, encounter notes, upcoming appointments, etc.  Non-urgent messages can be sent to your provider as well.   To learn more about what you can do with MyChart, go to [email or web address].    Your next appointment:   2-3 month(s)  The format for your next appointment:   In Person  Provider:   Shelva Majestic, MD        Signed, Shelva Majestic, MD  10/29/2019 6:04 PM    [facility] [address] [place], [place], [place]  [address] Phone: [phone]

## 2019-10-30 DIAGNOSIS — Z6835 Body mass index (BMI) 35.0-35.9, adult: Secondary | ICD-10-CM | POA: Diagnosis not present

## 2019-10-30 DIAGNOSIS — M5416 Radiculopathy, lumbar region: Secondary | ICD-10-CM | POA: Diagnosis not present

## 2019-10-30 DIAGNOSIS — M25561 Pain in right knee: Secondary | ICD-10-CM | POA: Diagnosis not present

## 2019-10-30 DIAGNOSIS — G894 Chronic pain syndrome: Secondary | ICD-10-CM | POA: Diagnosis not present

## 2019-10-31 ENCOUNTER — Telehealth: Payer: Self-pay | Admitting: *Deleted

## 2019-10-31 ENCOUNTER — Other Ambulatory Visit: Payer: Self-pay | Admitting: Cardiovascular Disease

## 2019-10-31 DIAGNOSIS — G4733 Obstructive sleep apnea (adult) (pediatric): Secondary | ICD-10-CM

## 2019-10-31 DIAGNOSIS — I509 Heart failure, unspecified: Secondary | ICD-10-CM

## 2019-10-31 NOTE — Telephone Encounter (Signed)
Faxed records for review to [facility] for in lab sleep study.

## 2019-11-07 ENCOUNTER — Telehealth: Payer: Self-pay | Admitting: *Deleted

## 2019-11-07 NOTE — Telephone Encounter (Signed)
Patient notified of sleep study appointment. Also made awre appointment should show up on my chart.

## 2019-11-11 ENCOUNTER — Other Ambulatory Visit: Payer: Self-pay | Admitting: Neurosurgery

## 2019-11-11 ENCOUNTER — Telehealth: Payer: Self-pay | Admitting: *Deleted

## 2019-11-11 NOTE — Telephone Encounter (Signed)
   [facility] Pre-operative Risk Assessment    HEARTCARE STAFF: - Please ensure there is not already an duplicate clearance open for this procedure. - Under Visit Info/Reason for Call, type in Other and utilize the format Clearance MM/DD/YY or Clearance TBD. Do not use dashes or single digits. - If request is for dental extraction, please clarify the # of teeth to be extracted.  Request for surgical clearance:  1. What type of surgery is being performed? L3-4, L4-5 Lumbar Fusion   2. When is this surgery scheduled? 12/11/2019    3. What type of clearance is required (medical clearance vs. Pharmacy clearance to hold med vs. Both)? Both  4. Are there any medications that need to be held prior to surgery and how long? Asa 19m   5. [facility] and [facility] GD[facility] SeverinP. Cram   6. What is the office phone number? [phone]   7.   What is the office fax number? [phone]  Anesthesia type (None, local, MAC, general) ? General   Harold [name] L 11/11/2019, 8:44 AM  _________________________________________________________________   (provider comments below)

## 2019-11-11 NOTE — Telephone Encounter (Signed)
Harold [name] 54 year old male is requesting a lumbar fusion.  May his aspirin be held for the procedure?  His last seen by you in clinic on 07/19/2019 and doing well at that time.  His PMH includes POET 2017 no ischemia however, hypertensive response was seen.  Acute pericarditis 03/26/2010 echocardiogram showed EF 55-60% mild LVH, trivial MR; mild-moderate circumferential EFF W/o tamponade, anxiety, chronic back pain, DDD, depression, diabetes, GERD, gout, OSA on CPAP, HLD, and essential hypertension.  Please direct response to CV DIV preop pool.  Thank you for your help.  Harold [name]. Harold Weatherwax NP-C    11/11/2019, 9:37 AM [address] Office [phone] Fax [phone]

## 2019-11-13 DIAGNOSIS — M25561 Pain in right knee: Secondary | ICD-10-CM | POA: Diagnosis not present

## 2019-11-14 NOTE — Telephone Encounter (Signed)
OK to hold ASA as needed for the procedure.

## 2019-11-15 NOTE — Telephone Encounter (Signed)
   Primary Cardiologist: Minus Breeding, MD  Chart reviewed as part of pre-operative protocol coverage. Patient was contacted 11/15/2019 in reference to pre-operative risk assessment for pending surgery as outlined below.  Harold [name] was last seen on 07/19/2019 by Dr. [name].  Since that day, Harold [name] has done well without chest pain or shortness of breath. Despite back pain, he is able to walk up 2 flights of stairs without any exertional symptom.   Therefore, based on ACC/AHA guidelines, the patient would be at acceptable risk for the planned procedure without further cardiovascular testing.   I will route this recommendation to the requesting party via Epic fax function and remove from pre-op pool. Please call with questions.  He may hold aspirin for 7 days prior to the surgery and restart as soon as possible afterward.   [place], [place] 11/15/2019, 4:08 PM

## 2019-11-17 DIAGNOSIS — M545 Low back pain: Secondary | ICD-10-CM | POA: Diagnosis not present

## 2019-11-17 DIAGNOSIS — G894 Chronic pain syndrome: Secondary | ICD-10-CM | POA: Diagnosis not present

## 2019-11-17 DIAGNOSIS — R2689 Other abnormalities of gait and mobility: Secondary | ICD-10-CM | POA: Diagnosis not present

## 2019-11-17 DIAGNOSIS — G9009 Other idiopathic peripheral autonomic neuropathy: Secondary | ICD-10-CM | POA: Diagnosis not present

## 2019-11-17 DIAGNOSIS — M961 Postlaminectomy syndrome, not elsewhere classified: Secondary | ICD-10-CM | POA: Diagnosis not present

## 2019-11-20 ENCOUNTER — Ambulatory Visit (HOSPITAL_BASED_OUTPATIENT_CLINIC_OR_DEPARTMENT_OTHER): Payer: Medicare HMO | Admitting: Cardiovascular Disease

## 2019-11-25 ENCOUNTER — Encounter: Payer: Self-pay | Admitting: Internal Medicine

## 2019-12-03 NOTE — Progress Notes (Deleted)
New Patient Note  RE: Harold [name] MRN: [id number] DOB: [birth year] Date of Office Visit: 12/04/2019  Referring provider: Biagio Borg, MD Primary care provider: Biagio Borg, MD  Chief Complaint: No chief complaint on file.  History of Present Illness: I had the pleasure of seeing Harold [name] for initial evaluation at the [facility] on 12/03/2019. He is a 54 y.o. male, who is referred here by Biagio Borg, MD for the evaluation of allergic rhinitis.  He reports symptoms of ***. Symptoms have been going on for *** years. The symptoms are present *** all year around with worsening in ***. Other triggers include exposure to ***. Anosmia: ***. Headache: ***. He has used *** with ***fair improvement in symptoms. Sinus infections: ***. Previous work up includes: ***. Previous ENT evaluation: ***. Previous sinus imaging: ***. History of nasal polyps: ***. Last eye exam: ***. History of reflux: ***.  Assessment and Plan: Harold [name] is a 54 y.o. male with: No problem-specific Assessment & Plan notes found for this encounter.  No follow-ups on file.  No orders of the defined types were placed in this encounter.  Lab Orders  No laboratory test(s) ordered today    Other allergy screening: Asthma: {Blank single:19197::"yes","no"} Rhino conjunctivitis: {Blank single:19197::"yes","no"} Food allergy: {Blank single:19197::"yes","no"} Medication allergy: {Blank single:19197::"yes","no"} Hymenoptera allergy: {Blank single:19197::"yes","no"} Urticaria: {Blank single:19197::"yes","no"} Eczema:{Blank single:19197::"yes","no"} History of recurrent infections suggestive of immunodeficency: {Blank single:19197::"yes","no"}  Diagnostics: Spirometry:  Tracings reviewed. His effort: {Blank single:19197::"Good reproducible efforts.","It was hard to get consistent efforts and there is a question as to whether this reflects a maximal maneuver.","Poor effort, data can not be  interpreted."} FVC: ***L FEV1: ***L, ***% predicted FEV1/FVC ratio: ***% Interpretation: {Blank single:19197::"Spirometry consistent with mild obstructive disease","Spirometry consistent with moderate obstructive disease","Spirometry consistent with severe obstructive disease","Spirometry consistent with possible restrictive disease","Spirometry consistent with mixed obstructive and restrictive disease","Spirometry uninterpretable due to technique","Spirometry consistent with normal pattern","No overt abnormalities noted given today's efforts"}.  Please see scanned spirometry results for details.  Skin Testing: {Blank single:19197::"Select foods","Environmental allergy panel","Environmental allergy panel and select foods","Food allergy panel","None","Deferred due to recent antihistamines use"}. Positive test to: ***. Negative test to: ***.  Results discussed with patient/family.   Past Medical History: Patient Active Problem List   Diagnosis Date Noted  . Type 2 diabetes mellitus with diabetic neuropathy, unspecified ([place]) 09/20/2019  . Right knee pain 09/20/2019  . Chronic low back pain 08/06/2019  . Recurrent falls 08/06/2019  . Educated about COVID-19 virus infection 07/11/2019  . Sinusitis 02/17/2019  . Allergic rhinitis 02/13/2018  . Sinus congestion 12/19/2016  . Thrush 08/02/2016  . Sleep disturbance 07/04/2016  . 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  . Urinary frequency 07/24/2015  . Trichomonas exposure 07/24/2015  . Type 2 diabetes mellitus ([place]) 03/04/2015  . Verruca vulgaris 02/24/2015  . Nocturia 02/17/2015  . Accessory skin tags 02/17/2015  . Acute sinusitis 06/04/2014  . Lumbar radiculopathy, acute 05/23/2014  . Acute left-sided back pain with sciatica 04/10/2014  . Smoker 04/10/2014  . Cough 01/01/2014  . Grief reaction 11/19/2013  . Primary localized osteoarthrosis, lower leg 09/04/2013  . Chronic pain syndrome 07/11/2013  . Hyperlipidemia 07/11/2013  .  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 Gonalgia 11/29/2012  . Encounter for well adult exam with abnormal findings 09/14/2012  . DDD (degenerative disc disease), lumbar 09/14/2012  . Left sided sciatica 09/14/2012  . Erectile dysfunction 09/14/2012  . Pre-ulcerative corn or callous 09/14/2012  . Hemorrhoids 07/26/2011  . Chronic back pain  greater than 3 months duration 09/23/2010  . Abnormal EKG 08/12/2010  . OSA (obstructive sleep apnea) 08/09/2010  . 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 CHF (congestive heart failure) ([place]) 06/29/2010  . Edema 06/10/2010  . Acute idiopathic pericarditis 04/08/2010  . NONSPECIFIC ABNORMAL ELECTROCARDIOGRAM 04/08/2010  . HYPERTRIGLYCERIDEMIA 05/26/2009  . SHOULDER PAIN, LEFT 04/21/2008  . Depression 03/11/2008  . Essential hypertension 03/11/2008  . Gout, unspecified 05/01/2007  . METABOLIC SYNDROME X 01/11/5101  . OBESITY NOS 10/24/2006   Past Medical History:  Diagnosis Date  . Acute idiopathic pericarditis 04/08/2010   Qualifier: Diagnosis of  By: [name], [name]    . Acute pericarditis    admx 03-26-10 thru 03-29-10; a. echo 03-26-10: EF 55-60%; mild LVH; trivial MR; RVF ok; mild -mod circumferential Eff w/o tamponade  . Acute sinusitis 06/04/2014  . Allergic rhinitis, cause unspecified 09/14/2012  . Allergy   . Anxiety   . CHF (congestive heart failure) ([place]) 06/29/2010  . Chronic back pain   . Chronic low back pain 08/06/2019  . 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 DDD (degenerative disc disease), lumbar 09/14/2012  . Depression   . Diabetes mellitus without complication (HCC)    borderline   . Edema 06/10/2010   Qualifier: Diagnosis of  By: [name], MD, [name]    . Erectile dysfunction 09/14/2012  . Essential hypertension 03/11/2008   Qualifier: Diagnosis of  By: [name] MD, [name]    . GERD (gastroesophageal reflux disease)   . Gout    pt denies  . Gout, unspecified 05/01/2007   Qualifier: Diagnosis of  By: [name] MD,  [name]   . Headache    migraines in the past  . History of pancreatitis    a. admx 04-2009.Marland KitchenMarland Kitchen? 2-2 triglycerides  . Hyperlipidemia 07/11/2013  . Hypertension   . Hypertriglyceridemia    a. followed by [facility]  . HYPERTRIGLYCERIDEMIA 05/26/2009   Qualifier: Diagnosis of  By: [name] MD, [name]   . Left sided sciatica 09/14/2012  . Metabolic syndrome   . METABOLIC SYNDROME X 5/85/2778   Qualifier: Diagnosis of  By: [name]    . Nocturia 02/17/2015  . NONSPECIFIC ABNORMAL ELECTROCARDIOGRAM 04/08/2010   Qualifier: Diagnosis of  By: [name], RN, BSN, [name]    . Obesity   . OSA (obstructive sleep apnea)    CPAP  . Restless legs   . Type 2 diabetes mellitus ([place]) 03/04/2015  . Type 2 diabetes mellitus with diabetic neuropathy, unspecified ([place]) 09/20/2019  . Urinary frequency 07/24/2015   Past Surgical History: Past Surgical History:  Procedure Laterality Date  . COLONOSCOPY    . KNEE SURGERY     right  . LUMBAR LAMINECTOMY/DECOMPRESSION MICRODISCECTOMY N/A 08/26/2015   Procedure: Lumbar Two-Sacral One  Laminectomy for decompression;  Surgeon: [name], MD;  Location: [facility];  Service: Neurosurgery;  Laterality: N/A;  . pericardectomy  07/15/2010   Dr. [name]  . pleurx catheter placement  07/15/2010   Dr. [name]  . SHOULDER SURGERY     right and left shoulders  . TONSILLECTOMY AND ADENOIDECTOMY     one tonsil   Medication List:  Current Outpatient Medications  Medication Sig Dispense Refill  . amLODipine (NORVASC) 10 MG tablet TAKE ONE TABLET BY MOUTH DAILY 90 tablet 0  . ammonium lactate (AMLACTIN) 12 % lotion Apply 1 application topically as needed for dry skin. 400 g 3  . aspirin EC 81 MG tablet Take 81 mg by mouth daily.    [name]  Blood Glucose Monitoring Suppl (ONE TOUCH ULTRA 2) w/Device KIT Use as directed daily E11.9 1 each 0  . citalopram (CELEXA) 40 MG tablet TAKE ONE TABLET BY MOUTH DAILY 90 tablet 2  . diclofenac Sodium (VOLTAREN) 1 %  GEL     . fenofibrate (TRICOR) 145 MG tablet TAKE ONE TABLET BY MOUTH DAILY 90 tablet 2  . fentaNYL (DURAGESIC - DOSED MCG/HR) 50 MCG/HR Place 50 mcg onto the skin every 3 (three) days.    . fluconazole (DIFLUCAN) 100 MG tablet 1 tablet daily by mouth 7 tablet 0  . furosemide (LASIX) 40 MG tablet TAKE ONE TABLET BY MOUTH DAILY 90 tablet 3  . gabapentin (NEURONTIN) 300 MG capsule Take 900 mg by mouth 3 (three) times daily.    [name] glucose blood test strip Use as instructed once daily E119 100 each 12  . ibuprofen (ADVIL,MOTRIN) 800 MG tablet Take 800 mg by mouth every 8 (eight) hours as needed.    . Lancets MISC Use as directed daily E11.9 100 each 11  . lisinopril (ZESTRIL) 40 MG tablet TAKE ONE TABLET BY MOUTH DAILY 90 tablet 2  . metFORMIN (GLUCOPHAGE-XR) 500 MG 24 hr tablet TAKE TWO TABLETS BY MOUTH DAILY WITH BREAKFAST 180 tablet 2  . OVER THE COUNTER MEDICATION Take 500 mg by mouth daily.    [name] oxyCODONE-acetaminophen (PERCOCET) 10-325 MG tablet Take 1 tablet by mouth every 6 (six) hours as needed for pain. 90 tablet 0  . Potassium Chloride ER 20 MEQ TBCR TAKE ONE TABLET BY MOUTH DAILY 90 tablet 2  . rosuvastatin (CRESTOR) 20 MG tablet TAKE ONE TABLET BY MOUTH DAILY 90 tablet 0  . sildenafil (VIAGRA) 100 MG tablet Take 0.5-1 tablets (50-100 mg total) by mouth daily as needed for erectile dysfunction. 5 tablet 11  . terbinafine (LAMISIL AT) 1 % cream Apply 1 application topically daily as needed (foot fungus). 42 g 0  . terbinafine (LAMISIL) 250 MG tablet Take 1 tablet (250 mg total) by mouth daily. 90 tablet 1  . tiZANidine (ZANAFLEX) 4 MG tablet     . tolterodine (DETROL) 2 MG tablet TAKE 1 TABLET TWICE DAILY 180 tablet 1  . triamcinolone (NASACORT) 55 MCG/ACT AERO nasal inhaler Place 2 sprays into the nose daily. 1 Inhaler 12  . vitamin B-12 (CYANOCOBALAMIN) 1000 MCG tablet Take 1 tablet (1,000 mcg total) by mouth daily. 90 tablet 1  . Vitamin D, Ergocalciferol, (DRISDOL) 1.25 MG (50000  UNIT) CAPS capsule Take 1 capsule (50,000 Units total) by mouth every 7 (seven) days. 12 capsule 0  . VOLTAREN 1 % GEL Apply 2 g topically 4 (four) times daily. 100 g 5   No current facility-administered medications for this visit.   Allergies: Allergies  Allergen Reactions  . Other Itching, Swelling and Palpitations    Pecans: itching and swelling of the tongue   . Diphenhydramine Itching, Palpitations and Other (See Comments)    "jittery"  . Colchicine Diarrhea  . Lipitor [Atorvastatin] Other (See Comments)    Muscle cramps  . Septra [Sulfamethoxazole-Trimethoprim] Itching   Social History: Social History   Socioeconomic History  . Marital status: Divorced    Spouse name: Not on file  . Number of children: 1  . Years of education: Not on file  . Highest education level: Not on file  Occupational History  . Occupation: disabled    Fish farm manager: UNEMPLOYED  Tobacco Use  . Smoking status: Current Every Day Smoker    Packs/day: 0.50  Years: 20.00    Pack years: 10.00    Types: Cigarettes  . Smokeless tobacco: Never Used  Vaping Use  . Vaping Use: Former  Substance and Sexual Activity  . Alcohol use: Yes    Alcohol/week: 2.0 standard drinks    Types: 2 Shots of liquor per week    Comment: occasional   . Drug use: No  . Sexual activity: Not on file  Other Topics Concern  . Not on file  Social History Narrative   Regular exercise- No   Rare caffeine use    Social Determinants of Health   Financial Resource Strain:   . Difficulty of Paying Living Expenses: Not on file  Food Insecurity:   . Worried About Charity fundraiser in the Last Year: Not on file  . Ran Out of Food in the Last Year: Not on file  Transportation Needs:   . Lack of Transportation (Medical): Not on file  . Lack of Transportation (Non-Medical): Not on file  Physical Activity:   . Days of Exercise per Week: Not on file  . Minutes of Exercise per Session: Not on file  Stress:   . Feeling of  Stress : Not on file  Social Connections:   . Frequency of Communication with Friends and Family: Not on file  . Frequency of Social Gatherings with Friends and Family: Not on file  . Attends Religious Services: Not on file  . Active Member of Clubs or Organizations: Not on file  . Attends Archivist Meetings: Not on file  . Marital Status: Not on file   Lives in a ***. Smoking: *** Occupation: ***  Environmental HistoryFreight forwarder in the house: Estate agent in the family room: {Blank single:19197::"yes","no"} Carpet in the bedroom: {Blank single:19197::"yes","no"} Heating: {Blank single:19197::"electric","gas"} Cooling: {Blank single:19197::"central","window"} Pet: {Blank single:19197::"yes ***","no"}  Family History: Family History  Problem Relation Age of Onset  . Diabetes Mother   . Diabetes Father   . Colon cancer Father 28  . Heart disease Father 38       CABG  . Pancreatitis Father   . Hypertension Other        entire family  . Liver cancer Paternal Uncle   . Diabetes Sister   . Stomach cancer Maternal Grandfather   . Pancreatitis Paternal Uncle        x 2  . Esophageal cancer Neg Hx   . Rectal cancer Neg Hx   . Colon polyps Neg Hx    Problem                               Relation Asthma                                   *** Eczema                                *** Food allergy                          *** Allergic rhino conjunctivitis     ***  Review of Systems  Constitutional: Negative for appetite change, chills, fever and unexpected weight change.  HENT: Negative for congestion and rhinorrhea.   Eyes: Negative for itching.  Respiratory: Negative  for cough, chest tightness, shortness of breath and wheezing.   Cardiovascular: Negative for chest pain.  Gastrointestinal: Negative for abdominal pain.  Genitourinary: Negative for difficulty urinating.  Skin: Negative for rash.  Neurological: Negative for  headaches.   Objective: There were no vitals taken for this visit. There is no height or weight on file to calculate BMI. Physical Exam Vitals and nursing note reviewed.  Constitutional:      Appearance: Normal appearance. He is well-developed.  HENT:     Head: Normocephalic and atraumatic.     Right Ear: External ear normal.     Left Ear: External ear normal.     Nose: Nose normal.     Mouth/Throat:     Mouth: Mucous membranes are moist.     Pharynx: Oropharynx is clear.  Eyes:     Conjunctiva/sclera: Conjunctivae normal.  Cardiovascular:     Rate and Rhythm: Normal rate and regular rhythm.     Heart sounds: Normal heart sounds. No murmur heard.  No friction rub. No gallop.   Pulmonary:     Effort: Pulmonary effort is normal.     Breath sounds: Normal breath sounds. No wheezing, rhonchi or rales.  Abdominal:     Palpations: Abdomen is soft.  Musculoskeletal:     Cervical back: Neck supple.  Skin:    General: Skin is warm.     Findings: No rash.  Neurological:     Mental Status: He is alert and oriented to person, place, and time.  Psychiatric:        Behavior: Behavior normal.    The plan was reviewed with the patient/family, and all questions/concerned were addressed.  It was my pleasure to see Harold [name] today and participate in his care. Please feel free to contact me with any questions or concerns.  Sincerely,  Rexene Alberts, DO Allergy & Immunology  [facility] office: [phone] [facility] office: [place] office: [phone]

## 2019-12-04 ENCOUNTER — Ambulatory Visit: Payer: Medicare HMO | Admitting: Allergy

## 2019-12-04 DIAGNOSIS — M5416 Radiculopathy, lumbar region: Secondary | ICD-10-CM | POA: Diagnosis not present

## 2019-12-04 DIAGNOSIS — M5126 Other intervertebral disc displacement, lumbar region: Secondary | ICD-10-CM | POA: Diagnosis not present

## 2019-12-04 DIAGNOSIS — I10 Essential (primary) hypertension: Secondary | ICD-10-CM | POA: Diagnosis not present

## 2019-12-04 DIAGNOSIS — G894 Chronic pain syndrome: Secondary | ICD-10-CM | POA: Diagnosis not present

## 2019-12-11 ENCOUNTER — Ambulatory Visit (HOSPITAL_BASED_OUTPATIENT_CLINIC_OR_DEPARTMENT_OTHER): Payer: Medicare HMO | Attending: Cardiovascular Disease | Admitting: Cardiovascular Disease

## 2019-12-18 DIAGNOSIS — R2689 Other abnormalities of gait and mobility: Secondary | ICD-10-CM | POA: Diagnosis not present

## 2019-12-18 DIAGNOSIS — G894 Chronic pain syndrome: Secondary | ICD-10-CM | POA: Diagnosis not present

## 2019-12-18 DIAGNOSIS — G9009 Other idiopathic peripheral autonomic neuropathy: Secondary | ICD-10-CM | POA: Diagnosis not present

## 2019-12-18 DIAGNOSIS — M961 Postlaminectomy syndrome, not elsewhere classified: Secondary | ICD-10-CM | POA: Diagnosis not present

## 2019-12-18 DIAGNOSIS — M545 Low back pain: Secondary | ICD-10-CM | POA: Diagnosis not present

## 2019-12-25 ENCOUNTER — Other Ambulatory Visit: Payer: Self-pay

## 2019-12-25 ENCOUNTER — Ambulatory Visit (INDEPENDENT_AMBULATORY_CARE_PROVIDER_SITE_OTHER): Payer: Medicare HMO

## 2019-12-25 ENCOUNTER — Other Ambulatory Visit: Payer: Self-pay | Admitting: Family Medicine

## 2019-12-25 ENCOUNTER — Ambulatory Visit: Payer: Self-pay

## 2019-12-25 ENCOUNTER — Ambulatory Visit (INDEPENDENT_AMBULATORY_CARE_PROVIDER_SITE_OTHER): Payer: Medicare HMO | Admitting: Family Medicine

## 2019-12-25 VITALS — BP 130/80 | HR 60 | Ht 71.0 in | Wt 258.0 lb

## 2019-12-25 DIAGNOSIS — M1711 Unilateral primary osteoarthritis, right knee: Secondary | ICD-10-CM | POA: Diagnosis not present

## 2019-12-25 DIAGNOSIS — M25561 Pain in right knee: Secondary | ICD-10-CM

## 2019-12-25 NOTE — Progress Notes (Signed)
Subjective:    CC: R knee pain and swelling  I, Harold [name], am serving as a Education administrator for Dr. Lynne Leader.  HPI: Pt is a 54 y/o male presenting w/ c/o R knee pain and swelling.  He was seen previously for this in 2015 and had a R knee XR (01/01/14) and MRI (01/09/14).  More recently, pt reports was in the electric grocery cart and when he was on his way out of the store his knee hit the door frame. Locates pain to lateral knee tender to the touch and hard to sleep with the pain.  He has significant problems prior to his injury.  He has had multiple knee surgeries and meniscus debridements and has significant medial compartment knee osteoarthritis.  He has had genicular nerve ablations performed previously which is helped some in the past.  He is trying to delay total knee replacement if possible.  He has an upcoming back surgery soon.  R knee swelling: yes R knee mechanical symptoms: yes  Aggravating factors: "everything" Treatments tried: icing: ibuprofen  Pertinent review of Systems: No fevers or chills  Relevant historical information: Diabetes heart failure hypertension   Objective:    Vitals:   12/25/19 1456  BP: 130/80  Pulse: 60  SpO2: 96%   General: Well Developed, well nourished, and in no acute distress.   MSK: Right knee mature scar anterior medial knee otherwise normal-appearing with no erythema or induration. Tender palpation anterior lateral knee. Range of motion 5-100 degrees with crepitation. Stable ligamentous exam.   Lab and Radiology Results  3 images right knee obtained today personally independently interpreted. Significant medial compartment DJD.  Mild patellofemoral and lateral compartment DJD. No acute fracture. Await formal radiology review  Procedure: Real-time Ultrasound Guided Injection of right knee superior lateral patellar space Device: Philips Affiniti 50G Images permanently stored and available for review in PACS Verbal informed  consent obtained.  Discussed risks and benefits of procedure. Warned about infection bleeding damage to structures skin hypopigmentation and fat atrophy among others. Patient expresses understanding and agreement Time-out conducted.   Noted no overlying erythema, induration, or other signs of local infection.   Skin prepped in a sterile fashion.   Local anesthesia: Topical Ethyl chloride.   With sterile technique and under real time ultrasound guidance:  40 mg of Kenalog and 2 mils of Marcaine injected into joint. Fluid seen entering the joint.   Completed without difficulty   Pain partially resolved suggesting accurate placement of the medication.   Advised to call if fevers/chills, erythema, induration, drainage, or persistent bleeding.   Images permanently stored and available for review in the ultrasound unit.  Impression: Technically successful ultrasound guided injection.      Impression and Recommendations:    Assessment and Plan: 54 y.o. male with right knee pain after hitting his knee on a door jam while in a power wheelchair at the grocery store.  I do not see fracture on today's x-ray or ultrasound.  Discussed options.  Plan for steroid injection knee and continued normal management.  He already has significant arthritis in his knee and will need total knee replacement eventually.  Recheck with me as needed.  We will send today's note to Dr. Latanya Maudlin who is his orthopedic surgeon.[name]  [name]. Orders Placed This Encounter  Procedures  . [place] LIMITED JOINT SPACE STRUCTURES LOW RIGHT(NO LINKED CHARGES)    Order Specific Question:   Reason for Exam (SYMPTOM  OR DIAGNOSIS REQUIRED)  Answer:   R knee pain    Order Specific Question:   Preferred imaging location?    Answer:   [place]   No orders of the defined types were placed in this encounter.   Discussed warning signs or symptoms. Please see discharge instructions. Patient  expresses understanding.   The above documentation has been reviewed and is accurate and complete Lynne Leader, M.D.

## 2019-12-25 NOTE — Patient Instructions (Signed)
Thank you for coming in today. I will make sure your doctors get a copy of my notes.  Recheck with me as needed.  Eventually you will probably need a knee replacement but your back if the big priority now.   Should have xray results tomorrow.

## 2019-12-27 ENCOUNTER — Ambulatory Visit: Payer: Medicare HMO | Admitting: Family

## 2019-12-27 NOTE — Progress Notes (Signed)
X-ray right knee shows arthritis

## 2020-01-06 ENCOUNTER — Encounter (HOSPITAL_COMMUNITY): Payer: Self-pay

## 2020-01-06 ENCOUNTER — Other Ambulatory Visit: Payer: Self-pay | Admitting: Internal Medicine

## 2020-01-06 ENCOUNTER — Other Ambulatory Visit (HOSPITAL_COMMUNITY)
Admission: RE | Admit: 2020-01-06 | Discharge: 2020-01-06 | Disposition: A | Discharge status: Home / Self Care | Payer: Medicare HMO | Source: Ambulatory Visit | Attending: Neurosurgery | Admitting: Neurosurgery

## 2020-01-06 ENCOUNTER — Other Ambulatory Visit: Payer: Self-pay

## 2020-01-06 ENCOUNTER — Encounter (HOSPITAL_COMMUNITY)
Admission: RE | Admit: 2020-01-06 | Discharge: 2020-01-06 | Disposition: A | Discharge status: Home / Self Care | Payer: Medicare HMO | Source: Ambulatory Visit | Attending: Neurosurgery | Admitting: Neurosurgery

## 2020-01-06 DIAGNOSIS — Z791 Long term (current) use of non-steroidal anti-inflammatories (NSAID): Secondary | ICD-10-CM | POA: Diagnosis not present

## 2020-01-06 DIAGNOSIS — Z800 Family history of malignant neoplasm of digestive organs: Secondary | ICD-10-CM | POA: Diagnosis not present

## 2020-01-06 DIAGNOSIS — Z20822 Contact with and (suspected) exposure to covid-19: Secondary | ICD-10-CM | POA: Diagnosis present

## 2020-01-06 DIAGNOSIS — Z833 Family history of diabetes mellitus: Secondary | ICD-10-CM | POA: Diagnosis not present

## 2020-01-06 DIAGNOSIS — Z881 Allergy status to other antibiotic agents status: Secondary | ICD-10-CM | POA: Diagnosis not present

## 2020-01-06 DIAGNOSIS — Z888 Allergy status to other drugs, medicaments and biological substances status: Secondary | ICD-10-CM | POA: Diagnosis not present

## 2020-01-06 DIAGNOSIS — G4733 Obstructive sleep apnea (adult) (pediatric): Secondary | ICD-10-CM | POA: Diagnosis present

## 2020-01-06 DIAGNOSIS — M5116 Intervertebral disc disorders with radiculopathy, lumbar region: Secondary | ICD-10-CM | POA: Diagnosis not present

## 2020-01-06 DIAGNOSIS — Z7984 Long term (current) use of oral hypoglycemic drugs: Secondary | ICD-10-CM | POA: Diagnosis not present

## 2020-01-06 DIAGNOSIS — K219 Gastro-esophageal reflux disease without esophagitis: Secondary | ICD-10-CM | POA: Diagnosis not present

## 2020-01-06 DIAGNOSIS — Z7982 Long term (current) use of aspirin: Secondary | ICD-10-CM | POA: Diagnosis not present

## 2020-01-06 DIAGNOSIS — F17210 Nicotine dependence, cigarettes, uncomplicated: Secondary | ICD-10-CM | POA: Diagnosis present

## 2020-01-06 DIAGNOSIS — M48061 Spinal stenosis, lumbar region without neurogenic claudication: Secondary | ICD-10-CM | POA: Diagnosis present

## 2020-01-06 DIAGNOSIS — M5126 Other intervertebral disc displacement, lumbar region: Secondary | ICD-10-CM | POA: Diagnosis present

## 2020-01-06 DIAGNOSIS — Z79899 Other long term (current) drug therapy: Secondary | ICD-10-CM | POA: Diagnosis not present

## 2020-01-06 DIAGNOSIS — E119 Type 2 diabetes mellitus without complications: Secondary | ICD-10-CM | POA: Insufficient documentation

## 2020-01-06 DIAGNOSIS — I10 Essential (primary) hypertension: Secondary | ICD-10-CM | POA: Diagnosis present

## 2020-01-06 DIAGNOSIS — Z01812 Encounter for preprocedural laboratory examination: Secondary | ICD-10-CM | POA: Diagnosis not present

## 2020-01-06 DIAGNOSIS — M4300 Spondylolysis, site unspecified: Secondary | ICD-10-CM | POA: Diagnosis not present

## 2020-01-06 DIAGNOSIS — E785 Hyperlipidemia, unspecified: Secondary | ICD-10-CM | POA: Diagnosis present

## 2020-01-06 DIAGNOSIS — E1142 Type 2 diabetes mellitus with diabetic polyneuropathy: Secondary | ICD-10-CM | POA: Diagnosis present

## 2020-01-06 DIAGNOSIS — Z8249 Family history of ischemic heart disease and other diseases of the circulatory system: Secondary | ICD-10-CM | POA: Diagnosis not present

## 2020-01-06 DIAGNOSIS — M5136 Other intervertebral disc degeneration, lumbar region: Secondary | ICD-10-CM | POA: Diagnosis present

## 2020-01-06 DIAGNOSIS — Z981 Arthrodesis status: Secondary | ICD-10-CM | POA: Diagnosis not present

## 2020-01-06 HISTORY — DX: Concussion with loss of consciousness of unspecified duration, initial encounter: S06.0X9A

## 2020-01-06 HISTORY — DX: Polyneuropathy, unspecified: G62.9

## 2020-01-06 HISTORY — DX: Constipation, unspecified: K59.00

## 2020-01-06 LAB — BASIC METABOLIC PANEL WITH GFR
Anion gap: 12 (ref 5–15)
BUN: 10 mg/dL (ref 6–20)
CO2: 24 mmol/L (ref 22–32)
Calcium: 9.3 mg/dL (ref 8.9–10.3)
Chloride: 102 mmol/L (ref 98–111)
Creatinine, Ser: 0.74 mg/dL (ref 0.61–1.24)
GFR, Estimated: >60 mL/min (ref >60)
Glucose, Bld: 112 mg/dL — ABNORMAL HIGH (ref 70–99)
Potassium: 3.6 mmol/L (ref 3.5–5.1)
Sodium: 138 mmol/L (ref 135–145)

## 2020-01-06 LAB — TYPE AND SCREEN
ABO/RH(D): A POS
Antibody Screen: NEGATIVE

## 2020-01-06 LAB — CBC
HCT: 46.2 % (ref 39.0–52.0)
Hemoglobin: 15 g/dL (ref 13.0–17.0)
MCH: 32.1 pg (ref 26.0–34.0)
MCHC: 32.5 g/dL (ref 30.0–36.0)
MCV: 98.9 fL (ref 80.0–100.0)
Platelets: 369 10*3/uL (ref 150–400)
RBC: 4.67 MIL/uL (ref 4.22–5.81)
RDW: 12.9 % (ref 11.5–15.5)
WBC: 11.1 10*3/uL — ABNORMAL HIGH (ref 4.0–10.5)
nRBC: 0 % (ref 0.0–0.2)

## 2020-01-06 LAB — SURGICAL PCR SCREEN
MRSA, PCR: NEGATIVE
Staphylococcus aureus: NEGATIVE

## 2020-01-06 LAB — HEMOGLOBIN A1C
Hgb A1c MFr Bld: 6.7 % — ABNORMAL HIGH (ref 4.8–5.6)
Mean Plasma Glucose: 145.59 mg/dL

## 2020-01-06 LAB — GLUCOSE, CAPILLARY: Glucose-Capillary: 129 mg/dL — ABNORMAL HIGH (ref 70–99)

## 2020-01-06 LAB — SARS CORONAVIRUS 2 (TAT 6-24 HRS): SARS Coronavirus 2: NEGATIVE

## 2020-01-06 NOTE — Progress Notes (Addendum)
PCP - Dr Cathlean Cower  Cardiologist - Dr. Ardis Rowan  Chest x-ray - NA  EKG - 07/19/19  Stress Test -   ECHO -   Cardiac Cath -   Sleep Study - yes- was scheduled for a sleep study earlier this month, "could not make it due to family issue" CPAP - no  LABS-CBC, BMP, T/S, PCR  Aspirin - stopped 12/30/19  ERAS-no  HA1C- Fasting Blood Sugar - 120's Checks Blood Sugar ____1_ times a day  Anesthesia-  Pt denies having chest pain, sob, or fever at this time. All instructions explained to the pt, with a verbal understanding of the material. Pt agrees to go over the instructions while at home for a better understanding. Pt also instructed to self quarantine after being tested for COVID-19. The opportunity to ask questions was provided.

## 2020-01-06 NOTE — Progress Notes (Addendum)
WHAT DO I DO ABOUT MY DIABETES MEDICATION?          DO not take medications for Diabetes the morning of surgery.  How to Manage Your Diabetes Before and After Surgery  Why is it important to control my blood sugar before and after surgery? . Improving blood sugar levels before and after surgery helps healing and can limit problems. . A way of improving blood sugar control is eating a healthy diet by: o  Eating less sugar and carbohydrates o  Increasing activity/exercise o  Talking with your doctor about reaching your blood sugar goals . High blood sugars (greater than 180 mg/dL) can raise your risk of infections and slow your recovery, so you will need to focus on controlling your diabetes during the weeks before surgery. . Make sure that the doctor who takes care of your diabetes knows about your planned surgery including the date and location.  How do I manage my blood sugar before surgery? . Check your blood sugar at least 4 times a day, starting 2 days before surgery, to make sure that the level is not too high or low. o Check your blood sugar the morning of your surgery when you wake up and every 2 hours until you get to the Short Stay unit. . If your blood sugar is less than 70 mg/dL, you will need to treat for low blood sugar: o Do not take insulin. o Treat a low blood sugar (less than 70 mg/dL) with  cup of clear juice (cranberry or apple), 4 glucose tablets, OR glucose gel. Recheck blood sugar in 15 minutes after treatment (to make sure it is greater han 70 mg/dL). If your blood sugar is not greater than 70 mg/dL on recheck, call [phone] o  for further instructions. . Report your blood sugar to the short stay nurse when you get to Short Stay.  . If you are admitted to the hospital after surgery: o Your blood sugar will be checked by the staff and you will probably be given insulin after surgery (instead of oral diabetes medicines) to make sure you have good blood sugar  levels. o The goal for blood sugar control after surgery is 80-180 mg/dL.

## 2020-01-06 NOTE — Pre-Procedure Instructions (Signed)
Harold [name]  01/06/2020     Your procedure is scheduled on Wednesday, October 13.  Report to [facility], Main Entrance or Entrance "A" at 6:30 AM.                Your surgery or procedure is scheduled to begin at 8:30 AM   Call this number if you have problems the morning of surgery: [phone]  This is the number for the Pre- Surgical Desk.                For any other questions, please call [phone], Monday - Friday 8 AM - 4 PM.   Remember:  Do not eat or drink after midnight.   Take these medicines the morning of surgery with A SIP OF WATER : amLODipine (NORVASC) citalopram (CELEXA) fenofibrate (TRICOR)  gabapentin (NEURONTIN) rosuvastatin (CRESTOR) terbinafine (LAMISIL)   Take if needed: oxyCODONE-acetaminophen (PERCOCET) tiZANidine (ZANAFLEX)     traMADol (ULTRAM)  Follow your surgeon's instruction regarding Aspirin  1 Week prior to surgery STOP taking  Aspirin Products (Goody Powder, Excedrin Migraine), Ibuprofen (Advil), Naproxen (Aleve), Vitamins and Herbal Products (ie Fish Oil).    Special instructions:  DO NOT Smoke within 24 hours of surgery.  [facility]- Preparing For Surgery  Before surgery, you can play an important role. Because skin is not sterile, your skin needs to be as free of germs as possible. You can reduce the number of germs on your skin by washing with CHG (chlorahexidine gluconate) Soap before surgery.  CHG is an antiseptic cleaner which kills germs and bonds with the skin to continue killing germs even after washing.    Oral Hygiene is also important to reduce your risk of infection.  Remember - BRUSH YOUR TEETH THE MORNING OF SURGERY WITH YOUR REGULAR TOOTHPASTE  Please do not use if you have an allergy to CHG or antibacterial soaps. If your skin becomes reddened/irritated stop using the CHG.  Do not shave (including legs and underarms) for at least 48 hours prior to first CHG shower. It is OK to shave your  face.  Please follow these instructions carefully.   1. Shower the NIGHT BEFORE SURGERY and the MORNING OF SURGERY with CHG.   2. If you chose to wash your hair, wash your hair first as usual with your normal shampoo.  3. After you shampoo, wash your face and private area with the soap you use at home, then rinse your hair and body thoroughly to remove the shampoo and soap.  4. Use CHG as you would any other liquid soap. You can apply CHG directly to the skin and wash gently with a scrungie or a clean washcloth.   5. Apply the CHG Soap to your body ONLY FROM THE NECK DOWN.  Do not use on open wounds or open sores. Avoid contact with your eyes, ears, mouth and genitals (private parts).   6. Wash thoroughly, paying special attention to the area where your surgery will be performed.  7. Thoroughly rinse your body with warm water from the neck down.  8. DO NOT shower/wash with your normal soap after using and rinsing off the CHG Soap.  9. Pat yourself dry with a CLEAN TOWEL.  10. Wear CLEAN PAJAMAS to bed the night before surgery, wear comfortable clothes the morning of surgery  11. Place CLEAN SHEETS on your bed the night of your first shower and DO NOT SLEEP WITH PETS.  Day of Surgery: Shower  as instructed above. Do not apply any deodorants/lotions, powders or colognes.  Please wear clean clothes to the hospital/surgery center.   Remember to brush your teeth WITH YOUR REGULAR TOOTHPASTE.  Do not wear jewelry, make-up or nail polish.  Do not shave 48 hours prior to surgery.  Men may shave face and neck.  Do not bring valuables to the hospital.  [facility] is not responsible for any belongings or valuables.  Contacts, dentures or bridgework may not be worn into surgery.  Leave your suitcase in the car.  After surgery it may be brought to your room.  For patients admitted to the hospital, discharge time will be determined by your treatment team.  Patients discharged the day of  surgery will not be allowed to drive home.   Please read over the fact sheets that you were given.

## 2020-01-07 ENCOUNTER — Encounter: Payer: Self-pay | Admitting: Internal Medicine

## 2020-01-07 MED ORDER — NYSTATIN 100000 UNIT/ML MT SUSP: 500000.0000 [IU] | Freq: Four times a day (QID) | OROMUCOSAL | Status: AC

## 2020-01-07 MED ORDER — CLOTRIMAZOLE-BETAMETHASONE 1-0.05 % EX CREA: TOPICAL_CREAM | CUTANEOUS | 1 refills | Status: DC

## 2020-01-07 NOTE — Anesthesia Preprocedure Evaluation (Addendum)
Anesthesia Evaluation  Patient identified by MRN, date of birth, ID band Patient awake    Reviewed: Allergy & Precautions, NPO status , Patient's Chart, lab work & pertinent test results  Airway Mallampati: II  TM Distance: >3 FB Neck ROM: Full    Dental  (+) Dental Advisory Given, Teeth Intact   Pulmonary sleep apnea , Current Smoker,    breath sounds clear to auscultation       Cardiovascular hypertension, Pt. on medications  Rhythm:Regular Rate:Normal     Neuro/Psych  Headaches,  Neuromuscular disease    GI/Hepatic Neg liver ROS, GERD  ,  Endo/Other  diabetes  Renal/GU negative Renal ROS     Musculoskeletal  (+) Arthritis ,   Abdominal   Peds  Hematology negative hematology ROS (+)   Anesthesia Other Findings   Reproductive/Obstetrics                          Lab Results  Component Value Date   WBC 11.1 (H) 01/06/2020   HGB 15.0 01/06/2020   HCT 46.2 01/06/2020   MCV 98.9 01/06/2020   PLT 369 01/06/2020   Lab Results  Component Value Date   CREATININE 0.74 01/06/2020   BUN 10 01/06/2020   NA 138 01/06/2020   K 3.6 01/06/2020   CL 102 01/06/2020   CO2 24 01/06/2020    Anesthesia Physical Anesthesia Plan  ASA: III  Anesthesia Plan: General   Post-op Pain Management:    Induction: Intravenous  PONV Risk Score and Plan: 1 and Dexamethasone, Ondansetron and Treatment may vary due to age or medical condition  Airway Management Planned: Oral ETT  Additional Equipment:   Intra-op Plan:   Post-operative Plan: Extubation in OR  Informed Consent: I have reviewed the patients History and Physical, chart, labs and discussed the procedure including the risks, benefits and alternatives for the proposed anesthesia with the patient or authorized representative who has indicated his/her understanding and acceptance.     Dental advisory given  Plan Discussed with:    Anesthesia Plan Comments: (PAT note by Karoline Caldwell, PA-C: Follows with cardiology for remote history of pericarditis, severe OSA not currently on CPAP/BiPAP, hypertension, hyperlipidemia.  Exercise test in 2017 showed no ischemia, however he had a hypertensive response.  He had acute pericarditis 03/26/2010, echocardiogram showed EF 55-60%, mild LVH, trivial MR; mild-moderate circumferential effusion without tamponade.  Last seen by cardiologist Dr. Claiborne Billings 10/24/2019.  Per note, pt has history of severe OSA is currently not treated.  He has previously been on BiPAP.  He was recommended to undergo reevaluation and have BiPAP titration but this has not yet taken place. Cardiac clearance per telephone encounter 11/15/19, "Chart reviewed as part of pre-operative protocol coverage. Patient was contacted 11/15/2019 in reference to pre-operative risk assessment for pending surgery as outlined below.  Harold [name] was last seen on 07/19/2019 by Dr. [name].  Since that day, Harold [name] has done well without chest pain or shortness of breath. Despite back pain, he is able to walk up 2 flights of stairs without any exertional symptom. Therefore, based on ACC/AHA guidelines, the patient would be at acceptable risk for the planned procedure without further cardiovascular testing."    Hypertension not well controlled at PAT visit, 169/98.  Review of records shows recently better control from office visits 10/24/2019 and 12/25/2019, with BP recorded as 130/80 at both visits.  Preop labs reviewed, DM2 well-controlled with A1c 6.7.  Remainder  of labs unremarkable.  EKG 07/19/2019: NSR.  Rate 72.  Exercise tolerance test 08/12/2015: T wave inversion was noted during recovery in the II, III, aVF, V4, V5 and V6 leads. Hypertensive response to exercise, peak blood pressure 225/97, rest blood pressure 154/103 Shortness of breath main symptom, no chest pain. Fair exercise time of 5 minutes. Abnormal exercise treadmill  test, T wave inversions noted inferior laterally late in recovery. Intermediate risk study.   Dr. [name] commented on result stating that the EKG changes were nonspecific and the main issue was his hypertensive response.  TTE 03/26/2010 (during hospitalization for pericarditis): Study Conclusions   - Left ventricle: The cavity size was normal. Wall thickness was  increased in a pattern of mild LVH. Systolic function was normal.  The estimated ejection fraction was in the range of 55% to 60%.  Wall motion was normal; there were no regional wall motion  abnormalities. Left ventricular diastolic function parameters were  indeterminant.  - Aortic valve: There was no stenosis.  - Mitral valve: Trivial regurgitation.  - Right ventricle: The cavity size was normal. Systolic function was  normal.  - Tricuspid valve: Peak RV-RA gradient: 62mm Hg (S).  - Pulmonary arteries: PA peak pressure: 17mm Hg (S).  - Inferior vena cava: The vessel was normal in size; the  respirophasic diameter changes were in the normal range (= 50%);  findings are consistent with normal central venous pressure.  - Pericardium, extracardiac: Mild to moderate pericardial effusion.  There is mild right atrial indentation. No significant RV  diastolic collapse. The IVC is normal. < 25% respirophasic  variation of the mitral inflow E velocity. No evidence for  tamponade.   )      Anesthesia Quick Evaluation

## 2020-01-07 NOTE — Progress Notes (Signed)
Anesthesia Chart Review:  Follows with cardiology for remote history of pericarditis, severe OSA not currently on CPAP/BiPAP, hypertension, hyperlipidemia.  Exercise test in 2017 showed no ischemia, however he had a hypertensive response.  He had acute pericarditis 03/26/2010, echocardiogram showed EF 55-60%, mild LVH, trivial MR; mild-moderate circumferential effusion without tamponade.  Last seen by cardiologist Dr. Claiborne Billings 10/24/2019.  Per note, pt has history of severe OSA is currently not treated.  He has previously been on BiPAP.  He was recommended to undergo reevaluation and have BiPAP titration but this has not yet taken place. Cardiac clearance per telephone encounter 11/15/19, "Chart reviewed as part of pre-operative protocol coverage. Patient was contacted 11/15/2019 in reference to pre-operative risk assessment for pending surgery as outlined below.  BARD HAUPERT was last seen on 07/19/2019 by Dr. [name].  Since that day, TORYN MCCLINTON has done well without chest pain or shortness of breath. Despite back pain, he is able to walk up 2 flights of stairs without any exertional symptom. Therefore, based on ACC/AHA guidelines, the patient would be at acceptable risk for the planned procedure without further cardiovascular testing."    Hypertension not well controlled at PAT visit, 169/98.  Review of records shows recently better control from office visits 10/24/2019 and 12/25/2019, with BP recorded as 130/80 at both visits.  Preop labs reviewed, DM2 well-controlled with A1c 6.7.  Remainder of labs unremarkable.  EKG 07/19/2019: NSR.  Rate 72.  Exercise tolerance test 08/12/2015:  T wave inversion was noted during recovery in the II, III, aVF, V4, V5 and V6 leads.  Hypertensive response to exercise, peak blood pressure 225/97, rest blood pressure 154/103  Shortness of breath main symptom, no chest pain. Fair exercise time of 5 minutes.  Abnormal exercise treadmill test, T wave inversions noted  inferior laterally late in recovery. Intermediate risk study.   Dr. [name] commented on result stating that the EKG changes were nonspecific and the main issue was his hypertensive response.  TTE 03/26/2010 (during hospitalization for pericarditis): Study Conclusions   - Left ventricle: The cavity size was normal. Wall thickness was  increased in a pattern of mild LVH. Systolic function was normal.  The estimated ejection fraction was in the range of 55% to 60%.  Wall motion was normal; there were no regional wall motion  abnormalities. Left ventricular diastolic function parameters were  indeterminant.  - Aortic valve: There was no stenosis.  - Mitral valve: Trivial regurgitation.  - Right ventricle: The cavity size was normal. Systolic function was  normal.  - Tricuspid valve: Peak RV-RA gradient: 57mm Hg (S).  - Pulmonary arteries: PA peak pressure: 56mm Hg (S).  - Inferior vena cava: The vessel was normal in size; the  respirophasic diameter changes were in the normal range (= 50%);  findings are consistent with normal central venous pressure.  - Pericardium, extracardiac: Mild to moderate pericardial effusion.  There is mild right atrial indentation. No significant RV  diastolic collapse. The IVC is normal. < 25% respirophasic  variation of the mitral inflow E velocity. No evidence for  tamponade.    Wynonia Musty [facility] Short Stay Center/Anesthesiology Phone [phone] 01/07/2020 10:00 AM

## 2020-01-08 ENCOUNTER — Encounter (HOSPITAL_COMMUNITY)
Admission: RE | Disposition: A | Discharge status: Home / Self Care | Payer: Self-pay | Source: Home / Self Care | Attending: Neurosurgery

## 2020-01-08 ENCOUNTER — Inpatient Hospital Stay (HOSPITAL_COMMUNITY)
Admission: RE | Admit: 2020-01-08 | Discharge: 2020-01-10 | DRG: 455 | Disposition: A | Discharge status: Home / Self Care | Payer: Medicare HMO | Attending: Neurosurgery | Admitting: Neurosurgery

## 2020-01-08 ENCOUNTER — Inpatient Hospital Stay (HOSPITAL_COMMUNITY): Payer: Medicare HMO | Admitting: Physician Assistant

## 2020-01-08 ENCOUNTER — Encounter (HOSPITAL_COMMUNITY): Payer: Self-pay | Admitting: Neurosurgery

## 2020-01-08 ENCOUNTER — Inpatient Hospital Stay (HOSPITAL_COMMUNITY): Payer: Medicare HMO

## 2020-01-08 ENCOUNTER — Other Ambulatory Visit: Payer: Self-pay

## 2020-01-08 ENCOUNTER — Inpatient Hospital Stay (HOSPITAL_COMMUNITY): Payer: Medicare HMO | Admitting: Certified Registered Nurse Anesthetist

## 2020-01-08 DIAGNOSIS — E785 Hyperlipidemia, unspecified: Secondary | ICD-10-CM | POA: Diagnosis present

## 2020-01-08 DIAGNOSIS — Z881 Allergy status to other antibiotic agents status: Secondary | ICD-10-CM | POA: Diagnosis not present

## 2020-01-08 DIAGNOSIS — G4733 Obstructive sleep apnea (adult) (pediatric): Secondary | ICD-10-CM | POA: Diagnosis present

## 2020-01-08 DIAGNOSIS — Z01812 Encounter for preprocedural laboratory examination: Secondary | ICD-10-CM

## 2020-01-08 DIAGNOSIS — M5126 Other intervertebral disc displacement, lumbar region: Secondary | ICD-10-CM | POA: Diagnosis present

## 2020-01-08 DIAGNOSIS — F17210 Nicotine dependence, cigarettes, uncomplicated: Secondary | ICD-10-CM | POA: Diagnosis present

## 2020-01-08 DIAGNOSIS — Z833 Family history of diabetes mellitus: Secondary | ICD-10-CM

## 2020-01-08 DIAGNOSIS — Z7982 Long term (current) use of aspirin: Secondary | ICD-10-CM

## 2020-01-08 DIAGNOSIS — Z20822 Contact with and (suspected) exposure to covid-19: Secondary | ICD-10-CM | POA: Diagnosis present

## 2020-01-08 DIAGNOSIS — Z800 Family history of malignant neoplasm of digestive organs: Secondary | ICD-10-CM | POA: Diagnosis not present

## 2020-01-08 DIAGNOSIS — Z7984 Long term (current) use of oral hypoglycemic drugs: Secondary | ICD-10-CM | POA: Diagnosis not present

## 2020-01-08 DIAGNOSIS — E1142 Type 2 diabetes mellitus with diabetic polyneuropathy: Secondary | ICD-10-CM | POA: Diagnosis present

## 2020-01-08 DIAGNOSIS — Z981 Arthrodesis status: Secondary | ICD-10-CM | POA: Diagnosis not present

## 2020-01-08 DIAGNOSIS — M5442 Lumbago with sciatica, left side: Secondary | ICD-10-CM

## 2020-01-08 DIAGNOSIS — Z8249 Family history of ischemic heart disease and other diseases of the circulatory system: Secondary | ICD-10-CM | POA: Diagnosis not present

## 2020-01-08 DIAGNOSIS — E1140 Type 2 diabetes mellitus with diabetic neuropathy, unspecified: Secondary | ICD-10-CM

## 2020-01-08 DIAGNOSIS — Z419 Encounter for procedure for purposes other than remedying health state, unspecified: Secondary | ICD-10-CM

## 2020-01-08 DIAGNOSIS — Z79899 Other long term (current) drug therapy: Secondary | ICD-10-CM | POA: Diagnosis not present

## 2020-01-08 DIAGNOSIS — I10 Essential (primary) hypertension: Secondary | ICD-10-CM | POA: Diagnosis present

## 2020-01-08 DIAGNOSIS — Z791 Long term (current) use of non-steroidal anti-inflammatories (NSAID): Secondary | ICD-10-CM | POA: Diagnosis not present

## 2020-01-08 DIAGNOSIS — Z888 Allergy status to other drugs, medicaments and biological substances status: Secondary | ICD-10-CM | POA: Diagnosis not present

## 2020-01-08 DIAGNOSIS — M48061 Spinal stenosis, lumbar region without neurogenic claudication: Secondary | ICD-10-CM | POA: Diagnosis present

## 2020-01-08 DIAGNOSIS — M5136 Other intervertebral disc degeneration, lumbar region: Secondary | ICD-10-CM | POA: Diagnosis present

## 2020-01-08 DIAGNOSIS — G8929 Other chronic pain: Secondary | ICD-10-CM

## 2020-01-08 LAB — HEMOGLOBIN AND HEMATOCRIT, BLOOD
HCT: 41 % (ref 39.0–52.0)
Hemoglobin: 12.9 g/dL — ABNORMAL LOW (ref 13.0–17.0)

## 2020-01-08 LAB — GLUCOSE, CAPILLARY
Glucose-Capillary: 126 mg/dL — ABNORMAL HIGH (ref 70–99)
Glucose-Capillary: 195 mg/dL — ABNORMAL HIGH (ref 70–99)
Glucose-Capillary: 198 mg/dL — ABNORMAL HIGH (ref 70–99)
Glucose-Capillary: 186 mg/dL — ABNORMAL HIGH (ref 70–99)

## 2020-01-08 SURGERY — POSTERIOR LUMBAR FUSION 2 LEVEL
Anesthesia: General | Site: Spine Lumbar

## 2020-01-08 MED ORDER — PHENOL 1.4 % MT LIQD: 1.0000 | OROMUCOSAL | Status: DC | PRN

## 2020-01-08 MED ORDER — MENTHOL 3 MG MT LOZG: 1.0000 | LOZENGE | OROMUCOSAL | Status: DC | PRN

## 2020-01-08 MED ORDER — ADULT MULTIVITAMIN W/MINERALS CH
1.0000 | ORAL_TABLET | Freq: Every day | ORAL | Status: DC
  Administered 2020-01-08 – 2020-01-10 (×3): 1 via ORAL
  Filled 2020-01-08 (×3): qty 1

## 2020-01-08 MED ORDER — TIZANIDINE HCL 4 MG PO TABS: 4.0000 mg | ORAL_TABLET | Freq: Three times a day (TID) | ORAL | Status: DC | PRN

## 2020-01-08 MED ORDER — POTASSIUM CHLORIDE CRYS ER 20 MEQ PO TBCR
20.0000 meq | EXTENDED_RELEASE_TABLET | Freq: Every day | ORAL | Status: DC
  Administered 2020-01-09: 20 meq via ORAL
  Filled 2020-01-08 (×2): qty 1

## 2020-01-08 MED ORDER — FENTANYL 50 MCG/HR TD PT72
1.0000 | MEDICATED_PATCH | TRANSDERMAL | Status: DC
  Administered 2020-01-08: 1 via TRANSDERMAL
  Filled 2020-01-08: qty 1

## 2020-01-08 MED ORDER — ACETAMINOPHEN 500 MG PO TABS
500.0000 mg | ORAL_TABLET | Freq: Once | ORAL | Status: AC
  Administered 2020-01-08: 500 mg via ORAL

## 2020-01-08 MED ORDER — ASCORBIC ACID 500 MG PO TABS
500.0000 mg | ORAL_TABLET | Freq: Every day | ORAL | Status: DC
  Administered 2020-01-08 – 2020-01-10 (×3): 500 mg via ORAL
  Filled 2020-01-08 (×3): qty 1

## 2020-01-08 MED ORDER — ONDANSETRON HCL 4 MG/2ML IJ SOLN
INTRAMUSCULAR | Status: DC | PRN
  Administered 2020-01-08: 4 mg via INTRAVENOUS

## 2020-01-08 MED ORDER — ONDANSETRON HCL 4 MG/2ML IJ SOLN
INTRAMUSCULAR | Status: AC
  Filled 2020-01-08: qty 2

## 2020-01-08 MED ORDER — EPHEDRINE SULFATE-NACL 50-0.9 MG/10ML-% IV SOSY
PREFILLED_SYRINGE | INTRAVENOUS | Status: DC | PRN
  Administered 2020-01-08 (×4): 5 mg via INTRAVENOUS

## 2020-01-08 MED ORDER — PANTOPRAZOLE SODIUM 40 MG PO TBEC
40.0000 mg | DELAYED_RELEASE_TABLET | Freq: Every day | ORAL | Status: DC
  Administered 2020-01-08 – 2020-01-09 (×2): 40 mg via ORAL
  Filled 2020-01-08 (×2): qty 1

## 2020-01-08 MED ORDER — CYCLOBENZAPRINE HCL 10 MG PO TABS
ORAL_TABLET | ORAL | Status: AC
  Filled 2020-01-08: qty 1

## 2020-01-08 MED ORDER — NYSTATIN 100000 UNIT/ML MT SUSP
500000.0000 [IU] | Freq: Four times a day (QID) | OROMUCOSAL | Status: DC
  Administered 2020-01-08 – 2020-01-10 (×6): 500000 [IU] via ORAL
  Filled 2020-01-08 (×9): qty 5

## 2020-01-08 MED ORDER — DEXAMETHASONE SODIUM PHOSPHATE 10 MG/ML IJ SOLN
10.0000 mg | Freq: Once | INTRAMUSCULAR | Status: AC
  Administered 2020-01-08: 10 mg via INTRAVENOUS

## 2020-01-08 MED ORDER — ORAL CARE MOUTH RINSE: 15.0000 mL | Freq: Once | OROMUCOSAL | Status: AC

## 2020-01-08 MED ORDER — KETAMINE HCL 50 MG/5ML IJ SOSY
PREFILLED_SYRINGE | INTRAMUSCULAR | Status: AC
  Filled 2020-01-08: qty 5

## 2020-01-08 MED ORDER — HYDROMORPHONE HCL 1 MG/ML IJ SOLN
0.2500 mg | INTRAMUSCULAR | Status: DC | PRN
  Administered 2020-01-08: 0.25 mg via INTRAVENOUS
  Administered 2020-01-08: 0.5 mg via INTRAVENOUS

## 2020-01-08 MED ORDER — ALBUMIN HUMAN 5 % IV SOLN: INTRAVENOUS | Status: DC | PRN

## 2020-01-08 MED ORDER — CLOTRIMAZOLE 1 % EX CREA: TOPICAL_CREAM | Freq: Two times a day (BID) | CUTANEOUS | Status: DC

## 2020-01-08 MED ORDER — TRAMADOL HCL 50 MG PO TABS
50.0000 mg | ORAL_TABLET | Freq: Four times a day (QID) | ORAL | Status: DC | PRN
  Filled 2020-01-08: qty 1

## 2020-01-08 MED ORDER — SODIUM CHLORIDE 0.9% FLUSH: 3.0000 mL | INTRAVENOUS | Status: DC | PRN

## 2020-01-08 MED ORDER — VITAMIN B-12 1000 MCG PO TABS
1000.0000 ug | ORAL_TABLET | Freq: Every day | ORAL | Status: DC
  Administered 2020-01-08 – 2020-01-10 (×3): 1000 ug via ORAL
  Filled 2020-01-08 (×3): qty 1

## 2020-01-08 MED ORDER — THROMBIN 20000 UNITS EX SOLR: CUTANEOUS | Status: DC | PRN

## 2020-01-08 MED ORDER — FENTANYL CITRATE (PF) 100 MCG/2ML IJ SOLN
INTRAMUSCULAR | Status: DC | PRN
Start: 2020-01-08 — End: 2020-01-08
  Administered 2020-01-08 (×3): 50 ug via INTRAVENOUS
  Administered 2020-01-08: 100 ug via INTRAVENOUS
  Administered 2020-01-08 (×2): 50 ug via INTRAVENOUS
  Administered 2020-01-08: 100 ug via INTRAVENOUS
  Administered 2020-01-08: 50 ug via INTRAVENOUS

## 2020-01-08 MED ORDER — AMLODIPINE BESYLATE 5 MG PO TABS
10.0000 mg | ORAL_TABLET | Freq: Every day | ORAL | Status: DC
  Administered 2020-01-09 – 2020-01-10 (×2): 10 mg via ORAL
  Filled 2020-01-08 (×2): qty 2

## 2020-01-08 MED ORDER — FUROSEMIDE 40 MG PO TABS
40.0000 mg | ORAL_TABLET | Freq: Every day | ORAL | Status: DC
  Administered 2020-01-09: 40 mg via ORAL
  Filled 2020-01-08: qty 1

## 2020-01-08 MED ORDER — SILDENAFIL CITRATE 100 MG PO TABS: 50.0000 mg | ORAL_TABLET | Freq: Every day | ORAL | Status: DC | PRN

## 2020-01-08 MED ORDER — CAL MAG ZINC +D3 PO TABS: ORAL_TABLET | Freq: Every day | ORAL | Status: DC

## 2020-01-08 MED ORDER — ROSUVASTATIN CALCIUM 20 MG PO TABS
20.0000 mg | ORAL_TABLET | Freq: Every day | ORAL | Status: DC
  Administered 2020-01-09 – 2020-01-10 (×2): 20 mg via ORAL
  Filled 2020-01-08 (×2): qty 1

## 2020-01-08 MED ORDER — AMISULPRIDE (ANTIEMETIC) 5 MG/2ML IV SOLN: 10.0000 mg | Freq: Once | INTRAVENOUS | Status: DC | PRN

## 2020-01-08 MED ORDER — OXYCODONE-ACETAMINOPHEN 10-325 MG PO TABS: 1.0000 | ORAL_TABLET | Freq: Four times a day (QID) | ORAL | Status: DC | PRN

## 2020-01-08 MED ORDER — GABAPENTIN 300 MG PO CAPS
900.0000 mg | ORAL_CAPSULE | Freq: Three times a day (TID) | ORAL | Status: DC
  Administered 2020-01-08 – 2020-01-10 (×5): 900 mg via ORAL
  Filled 2020-01-08: qty 9
  Filled 2020-01-08 (×4): qty 3

## 2020-01-08 MED ORDER — ROCURONIUM BROMIDE 10 MG/ML (PF) SYRINGE
PREFILLED_SYRINGE | INTRAVENOUS | Status: DC | PRN
  Administered 2020-01-08: 10 mg via INTRAVENOUS
  Administered 2020-01-08: 40 mg via INTRAVENOUS
  Administered 2020-01-08: 60 mg via INTRAVENOUS
  Administered 2020-01-08: 20 mg via INTRAVENOUS
  Administered 2020-01-08: 50 mg via INTRAVENOUS
  Administered 2020-01-08 (×3): 20 mg via INTRAVENOUS

## 2020-01-08 MED ORDER — LORATADINE 10 MG PO TABS: 10.0000 mg | ORAL_TABLET | Freq: Every day | ORAL | Status: DC | PRN

## 2020-01-08 MED ORDER — 0.9 % SODIUM CHLORIDE (POUR BTL) OPTIME
TOPICAL | Status: DC | PRN
  Administered 2020-01-08: 1000 mL

## 2020-01-08 MED ORDER — TERBINAFINE HCL 250 MG PO TABS: 250.0000 mg | ORAL_TABLET | Freq: Every day | ORAL | Status: DC

## 2020-01-08 MED ORDER — ONDANSETRON HCL 4 MG/2ML IJ SOLN: 4.0000 mg | Freq: Four times a day (QID) | INTRAMUSCULAR | Status: DC | PRN

## 2020-01-08 MED ORDER — HYDROMORPHONE HCL 1 MG/ML IJ SOLN
0.5000 mg | INTRAMUSCULAR | Status: DC | PRN
  Administered 2020-01-09: 0.5 mg via INTRAVENOUS
  Filled 2020-01-08: qty 0.5

## 2020-01-08 MED ORDER — CITALOPRAM HYDROBROMIDE 20 MG PO TABS
40.0000 mg | ORAL_TABLET | Freq: Every day | ORAL | Status: DC
  Administered 2020-01-09 – 2020-01-10 (×2): 40 mg via ORAL
  Filled 2020-01-08 (×2): qty 2

## 2020-01-08 MED ORDER — LIDOCAINE-EPINEPHRINE 1 %-1:100000 IJ SOLN
INTRAMUSCULAR | Status: DC | PRN
  Administered 2020-01-08: 10 mL

## 2020-01-08 MED ORDER — LIDOCAINE-EPINEPHRINE 1 %-1:100000 IJ SOLN
INTRAMUSCULAR | Status: AC
  Filled 2020-01-08: qty 1

## 2020-01-08 MED ORDER — THROMBIN 20000 UNITS EX SOLR
CUTANEOUS | Status: AC
  Filled 2020-01-08: qty 20000

## 2020-01-08 MED ORDER — PROPOFOL 10 MG/ML IV BOLUS
INTRAVENOUS | Status: AC
  Filled 2020-01-08: qty 40

## 2020-01-08 MED ORDER — BUPIVACAINE LIPOSOME 1.3 % IJ SUSP
20.0000 mL | Freq: Once | INTRAMUSCULAR | Status: AC
  Administered 2020-01-08: 20 mL
  Filled 2020-01-08: qty 20

## 2020-01-08 MED ORDER — OXYBUTYNIN CHLORIDE ER 5 MG PO TB24
5.0000 mg | ORAL_TABLET | Freq: Every day | ORAL | Status: DC
  Administered 2020-01-08 – 2020-01-09 (×2): 5 mg via ORAL
  Filled 2020-01-08 (×4): qty 1

## 2020-01-08 MED ORDER — LIDOCAINE 2% (20 MG/ML) 5 ML SYRINGE
INTRAMUSCULAR | Status: DC | PRN
  Administered 2020-01-08: 60 mg via INTRAVENOUS

## 2020-01-08 MED ORDER — ROCURONIUM BROMIDE 10 MG/ML (PF) SYRINGE
PREFILLED_SYRINGE | INTRAVENOUS | Status: AC
  Filled 2020-01-08: qty 10

## 2020-01-08 MED ORDER — FENOFIBRATE 54 MG PO TABS
54.0000 mg | ORAL_TABLET | Freq: Every day | ORAL | Status: DC
  Administered 2020-01-09 – 2020-01-10 (×2): 54 mg via ORAL
  Filled 2020-01-08 (×2): qty 1

## 2020-01-08 MED ORDER — PHENYLEPHRINE HCL-NACL 10-0.9 MG/250ML-% IV SOLN
INTRAVENOUS | Status: AC
  Filled 2020-01-08: qty 500

## 2020-01-08 MED ORDER — DEXAMETHASONE SODIUM PHOSPHATE 10 MG/ML IJ SOLN
INTRAMUSCULAR | Status: AC
  Filled 2020-01-08: qty 1

## 2020-01-08 MED ORDER — ACETAMINOPHEN 650 MG RE SUPP: 650.0000 mg | RECTAL | Status: DC | PRN

## 2020-01-08 MED ORDER — CHLORHEXIDINE GLUCONATE CLOTH 2 % EX PADS: 6.0000 | MEDICATED_PAD | Freq: Once | CUTANEOUS | Status: DC

## 2020-01-08 MED ORDER — CHLORHEXIDINE GLUCONATE 0.12 % MT SOLN
15.0000 mL | Freq: Once | OROMUCOSAL | Status: AC
  Administered 2020-01-08: 15 mL via OROMUCOSAL
  Filled 2020-01-08: qty 15

## 2020-01-08 MED ORDER — CEFAZOLIN SODIUM-DEXTROSE 2-4 GM/100ML-% IV SOLN
2.0000 g | Freq: Three times a day (TID) | INTRAVENOUS | Status: AC
  Administered 2020-01-08 – 2020-01-09 (×2): 2 g via INTRAVENOUS
  Filled 2020-01-08 (×2): qty 100

## 2020-01-08 MED ORDER — CYCLOBENZAPRINE HCL 10 MG PO TABS
10.0000 mg | ORAL_TABLET | Freq: Three times a day (TID) | ORAL | Status: DC | PRN
  Administered 2020-01-08 – 2020-01-09 (×3): 10 mg via ORAL
  Filled 2020-01-08 (×2): qty 1

## 2020-01-08 MED ORDER — HYDROMORPHONE HCL 1 MG/ML IJ SOLN
INTRAMUSCULAR | Status: AC
  Filled 2020-01-08: qty 1

## 2020-01-08 MED ORDER — MIDAZOLAM HCL 2 MG/2ML IJ SOLN
INTRAMUSCULAR | Status: DC | PRN
  Administered 2020-01-08: 2 mg via INTRAVENOUS

## 2020-01-08 MED ORDER — PANTOPRAZOLE SODIUM 40 MG IV SOLR: 40.0000 mg | Freq: Every day | INTRAVENOUS | Status: DC

## 2020-01-08 MED ORDER — OXYCODONE HCL 5 MG PO TABS
5.0000 mg | ORAL_TABLET | Freq: Four times a day (QID) | ORAL | Status: DC | PRN
  Administered 2020-01-08 – 2020-01-10 (×3): 5 mg via ORAL
  Filled 2020-01-08 (×3): qty 1

## 2020-01-08 MED ORDER — ACETAMINOPHEN 500 MG PO TABS
1000.0000 mg | ORAL_TABLET | Freq: Once | ORAL | Status: DC
  Filled 2020-01-08: qty 2

## 2020-01-08 MED ORDER — FENTANYL CITRATE (PF) 250 MCG/5ML IJ SOLN
INTRAMUSCULAR | Status: AC
  Filled 2020-01-08: qty 5

## 2020-01-08 MED ORDER — MIDAZOLAM HCL 2 MG/2ML IJ SOLN
INTRAMUSCULAR | Status: AC
  Filled 2020-01-08: qty 2

## 2020-01-08 MED ORDER — INSULIN ASPART 100 UNIT/ML ~~LOC~~ SOLN
0.0000 [IU] | Freq: Three times a day (TID) | SUBCUTANEOUS | Status: DC
  Administered 2020-01-09 (×2): 2 [IU] via SUBCUTANEOUS

## 2020-01-08 MED ORDER — FENTANYL CITRATE (PF) 250 MCG/5ML IJ SOLN
INTRAMUSCULAR | Status: AC
Start: 2020-01-08 — End: ?
  Filled 2020-01-08: qty 5

## 2020-01-08 MED ORDER — METFORMIN HCL ER 500 MG PO TB24
1000.0000 mg | ORAL_TABLET | Freq: Every day | ORAL | Status: DC
  Administered 2020-01-09 – 2020-01-10 (×2): 1000 mg via ORAL
  Filled 2020-01-08 (×2): qty 2

## 2020-01-08 MED ORDER — LISINOPRIL 20 MG PO TABS
40.0000 mg | ORAL_TABLET | Freq: Every day | ORAL | Status: DC
  Administered 2020-01-08 – 2020-01-10 (×3): 40 mg via ORAL
  Filled 2020-01-08 (×3): qty 2

## 2020-01-08 MED ORDER — PROPOFOL 10 MG/ML IV BOLUS
INTRAVENOUS | Status: DC | PRN
  Administered 2020-01-08: 150 mg via INTRAVENOUS

## 2020-01-08 MED ORDER — ALUM & MAG HYDROXIDE-SIMETH 200-200-20 MG/5ML PO SUSP: 30.0000 mL | Freq: Four times a day (QID) | ORAL | Status: DC | PRN

## 2020-01-08 MED ORDER — SODIUM CHLORIDE 0.9 % IV SOLN: 250.0000 mL | INTRAVENOUS | Status: DC

## 2020-01-08 MED ORDER — PHENYLEPHRINE 40 MCG/ML (10ML) SYRINGE FOR IV PUSH (FOR BLOOD PRESSURE SUPPORT)
PREFILLED_SYRINGE | INTRAVENOUS | Status: AC
  Filled 2020-01-08: qty 10

## 2020-01-08 MED ORDER — PHENYLEPHRINE HCL-NACL 10-0.9 MG/250ML-% IV SOLN
INTRAVENOUS | Status: DC | PRN
  Administered 2020-01-08: 40 ug/min via INTRAVENOUS

## 2020-01-08 MED ORDER — OXYCODONE-ACETAMINOPHEN 5-325 MG PO TABS
1.0000 | ORAL_TABLET | Freq: Four times a day (QID) | ORAL | Status: DC | PRN
  Administered 2020-01-09 – 2020-01-10 (×6): 1 via ORAL
  Filled 2020-01-08 (×6): qty 1

## 2020-01-08 MED ORDER — ASPIRIN EC 81 MG PO TBEC
81.0000 mg | DELAYED_RELEASE_TABLET | Freq: Every day | ORAL | Status: DC
  Administered 2020-01-08 – 2020-01-10 (×3): 81 mg via ORAL
  Filled 2020-01-08 (×3): qty 1

## 2020-01-08 MED ORDER — ONDANSETRON HCL 4 MG PO TABS: 4.0000 mg | ORAL_TABLET | Freq: Four times a day (QID) | ORAL | Status: DC | PRN

## 2020-01-08 MED ORDER — CEFAZOLIN SODIUM-DEXTROSE 2-4 GM/100ML-% IV SOLN
2.0000 g | INTRAVENOUS | Status: AC
  Administered 2020-01-08 (×2): 2 g via INTRAVENOUS
  Filled 2020-01-08: qty 100

## 2020-01-08 MED ORDER — SODIUM CHLORIDE 0.9% FLUSH
3.0000 mL | Freq: Two times a day (BID) | INTRAVENOUS | Status: DC
  Administered 2020-01-08: 3 mL via INTRAVENOUS

## 2020-01-08 MED ORDER — ACETAMINOPHEN 325 MG PO TABS: 650.0000 mg | ORAL_TABLET | ORAL | Status: DC | PRN

## 2020-01-08 MED ORDER — INSULIN ASPART 100 UNIT/ML ~~LOC~~ SOLN: 0.0000 [IU] | Freq: Every day | SUBCUTANEOUS | Status: DC

## 2020-01-08 MED ORDER — LACTATED RINGERS IV SOLN: INTRAVENOUS | Status: DC | PRN

## 2020-01-08 MED ORDER — GLYCOPYRROLATE PF 0.2 MG/ML IJ SOSY
PREFILLED_SYRINGE | INTRAMUSCULAR | Status: DC | PRN
  Administered 2020-01-08: .1 mg via INTRAVENOUS

## 2020-01-08 MED ORDER — SUGAMMADEX SODIUM 200 MG/2ML IV SOLN
INTRAVENOUS | Status: DC | PRN
  Administered 2020-01-08: 400 mg via INTRAVENOUS

## 2020-01-08 MED ORDER — KETAMINE HCL 10 MG/ML IJ SOLN
INTRAMUSCULAR | Status: DC | PRN
  Administered 2020-01-08 (×3): 10 mg via INTRAVENOUS
  Administered 2020-01-08: 40 mg via INTRAVENOUS
  Administered 2020-01-08 (×3): 10 mg via INTRAVENOUS

## 2020-01-08 MED ORDER — LACTATED RINGERS IV SOLN: INTRAVENOUS | Status: DC

## 2020-01-08 MED ORDER — EPHEDRINE 5 MG/ML INJ
INTRAVENOUS | Status: AC
  Filled 2020-01-08: qty 10

## 2020-01-08 MED ORDER — LIDOCAINE 2% (20 MG/ML) 5 ML SYRINGE
INTRAMUSCULAR | Status: AC
  Filled 2020-01-08: qty 5

## 2020-01-08 SURGICAL SUPPLY — 64 items
BASKET BONE COLLECTION (BASKET) ×2 IMPLANT
BENZOIN TINCTURE PRP APPL 2/3 (GAUZE/BANDAGES/DRESSINGS) ×2 IMPLANT
BLADE SURG 11 STRL SS (BLADE) ×2 IMPLANT
BONE VIVIGEN FORMABLE 10CC (Bone Implant) ×2 IMPLANT
BUR CUTTER 7.0 ROUND (BURR) ×2 IMPLANT
BUR MATCHSTICK NEURO 3.0 LAGG (BURR) ×2 IMPLANT
CANISTER SUCT 3000ML PPV (MISCELLANEOUS) ×2 IMPLANT
CAP LOCKING THREADED (Cap) ×12 IMPLANT
CARTRIDGE OIL MAESTRO DRILL (MISCELLANEOUS) ×1 IMPLANT
CNTNR URN SCR LID CUP LEK RST (MISCELLANEOUS) ×1 IMPLANT
CONT SPEC 4OZ STRL OR WHT (MISCELLANEOUS) ×2
COVER BACK TABLE 60X90IN (DRAPES) ×2 IMPLANT
DERMABOND ADVANCED (GAUZE/BANDAGES/DRESSINGS) ×1
DERMABOND ADVANCED .7 DNX12 (GAUZE/BANDAGES/DRESSINGS) ×1 IMPLANT
DIFFUSER DRILL AIR PNEUMATIC (MISCELLANEOUS) ×2 IMPLANT
DRAPE C-ARM 42X72 X-RAY (DRAPES) ×4 IMPLANT
DRAPE HALF SHEET 40X57 (DRAPES) ×2 IMPLANT
DRAPE LAPAROTOMY 100X72X124 (DRAPES) ×2 IMPLANT
DRAPE SURG 17X23 STRL (DRAPES) ×4 IMPLANT
DRSG OPSITE 4X5.5 SM (GAUZE/BANDAGES/DRESSINGS) ×2 IMPLANT
DRSG OPSITE POSTOP 4X10 (GAUZE/BANDAGES/DRESSINGS) ×2 IMPLANT
DURAPREP 26ML APPLICATOR (WOUND CARE) ×2 IMPLANT
ELECT REM PT RETURN 9FT ADLT (ELECTROSURGICAL) ×2
ELECTRODE REM PT RTRN 9FT ADLT (ELECTROSURGICAL) ×1 IMPLANT
EVACUATOR 3/16  PVC DRAIN (DRAIN) ×2
EVACUATOR 3/16 PVC DRAIN (DRAIN) ×1 IMPLANT
FIBER BOAT ALLOFUSE 15CC (Bone Implant) ×2 IMPLANT
GAUZE 4X4 16PLY RFD (DISPOSABLE) IMPLANT
GAUZE SPONGE 4X4 12PLY STRL (GAUZE/BANDAGES/DRESSINGS) ×2 IMPLANT
GLOVE BIO SURGEON STRL SZ7 (GLOVE) ×2 IMPLANT
GLOVE BIO SURGEON STRL SZ8 (GLOVE) ×4 IMPLANT
GLOVE BIOGEL PI IND STRL 7.0 (GLOVE) ×1 IMPLANT
GLOVE BIOGEL PI IND STRL 7.5 (GLOVE) ×2 IMPLANT
GLOVE BIOGEL PI INDICATOR 7.0 (GLOVE) ×1
GLOVE BIOGEL PI INDICATOR 7.5 (GLOVE) ×2
GLOVE INDICATOR 8.5 STRL (GLOVE) ×4 IMPLANT
GOWN STRL REUS W/ TWL LRG LVL3 (GOWN DISPOSABLE) ×2 IMPLANT
GOWN STRL REUS W/ TWL XL LVL3 (GOWN DISPOSABLE) ×2 IMPLANT
GOWN STRL REUS W/TWL LRG LVL3 (GOWN DISPOSABLE) ×4
GOWN STRL REUS W/TWL XL LVL3 (GOWN DISPOSABLE) ×4
KIT BASIN OR (CUSTOM PROCEDURE TRAY) ×2 IMPLANT
KIT POSITION SURG JACKSON T1 (MISCELLANEOUS) IMPLANT
KIT TURNOVER KIT B (KITS) ×2 IMPLANT
MILL MEDIUM DISP (BLADE) ×2 IMPLANT
NEEDLE HYPO 21X1.5 SAFETY (NEEDLE) ×2 IMPLANT
NEEDLE HYPO 25X1 1.5 SAFETY (NEEDLE) ×2 IMPLANT
NS IRRIG 1000ML POUR BTL (IV SOLUTION) ×2 IMPLANT
OIL CARTRIDGE MAESTRO DRILL (MISCELLANEOUS) ×2
PACK LAMINECTOMY NEURO (CUSTOM PROCEDURE TRAY) ×2 IMPLANT
ROD 85MM SPINAL (Rod) ×4 IMPLANT
SCREW AMP MODULAR CREO 6.5X45 (Screw) ×12 IMPLANT
SCREW PA THRD CREO TULIP 5.5X4 (Head) ×12 IMPLANT
SPACER SUSTAIN RT 12 8D 9X26 (Spacer) ×8 IMPLANT
SPONGE SURGIFOAM ABS GEL 100 (HEMOSTASIS) ×4 IMPLANT
STRIP CLOSURE SKIN 1/2X4 (GAUZE/BANDAGES/DRESSINGS) ×2 IMPLANT
SUT VIC AB 0 CT1 18XCR BRD8 (SUTURE) ×1 IMPLANT
SUT VIC AB 0 CT1 8-18 (SUTURE) ×2
SUT VIC AB 2-0 CT1 18 (SUTURE) ×2 IMPLANT
SUT VIC AB 4-0 PS2 27 (SUTURE) ×2 IMPLANT
SYR 20ML LL LF (SYRINGE) ×2 IMPLANT
TOWEL GREEN STERILE (TOWEL DISPOSABLE) ×2 IMPLANT
TOWEL GREEN STERILE FF (TOWEL DISPOSABLE) ×2 IMPLANT
TRAY FOLEY MTR SLVR 16FR STAT (SET/KITS/TRAYS/PACK) ×2 IMPLANT
WATER STERILE IRR 1000ML POUR (IV SOLUTION) ×2 IMPLANT

## 2020-01-08 NOTE — Anesthesia Procedure Notes (Signed)
Procedure Name: Intubation Date/Time: 01/08/2020 8:41 AM Performed by: Leonor Liv, CRNA Pre-anesthesia Checklist: Patient identified, Emergency Drugs available, Suction available and Patient being monitored Patient Re-evaluated:Patient Re-evaluated prior to induction Oxygen Delivery Method: Circle System Utilized Preoxygenation: Pre-oxygenation with 100% oxygen Induction Type: IV induction Ventilation: Mask ventilation without difficulty and Oral airway inserted - appropriate to patient size Laryngoscope Size: Mac and 4 Grade View: Grade I Tube type: Oral Tube size: 7.5 mm Number of attempts: 1 Airway Equipment and Method: Stylet and Oral airway Placement Confirmation: ETT inserted through vocal cords under direct vision,  positive ETCO2 and breath sounds checked- equal and bilateral Secured at: 23 cm Tube secured with: Tape Dental Injury: Teeth and Oropharynx as per pre-operative assessment  Comments: Performed by Heide Scales, SRNA with direct supervision by Abbie Sons, CRNA and [place], MDA

## 2020-01-08 NOTE — H&P (Signed)
Harold [name] is an 54 y.o. male.   Chief Complaint: Back and bilateral hip and leg pain HPI: 54 year old gentleman with a history of previous decompressive laminectomy performed several years ago comes in with severe back bilateral hip and leg pain with numbness tingling in his legs and his feet.  Work-up is revealed severe spinal stenosis from large disc herniations at L3-4 and L4-5 in the setting in the site of his previous laminectomy.  Due to patient's progression of clinical syndrome imaging findings and failed conservative treatment I recommended redo decompressive laminectomy and interbody fusions at that level have extensively gone over the risks and benefits of that procedure with him as well as perioperative course expectations of outcome and alternatives of surgery and he understood and agreed to proceed forward.  Past Medical History:  Diagnosis Date  . Acute idiopathic pericarditis 04/08/2010   Qualifier: Diagnosis of  By: [name], [name]    . Acute pericarditis    admx 03-26-10 thru 03-29-10; a. echo 03-26-10: EF 55-60%; mild LVH; trivial MR; RVF ok; mild -mod circumferential Eff w/o tamponade  . Acute sinusitis 06/04/2014  . Allergic rhinitis, cause unspecified 09/14/2012  . Allergy   . Anxiety   . CHF (congestive heart failure) ([place]) 06/29/2010   was pericardititis not CHF  . Chronic back pain   . Chronic low back pain 08/06/2019  . Closed head injury with concussion   . Constipation   . 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 DDD (degenerative disc disease), lumbar 09/14/2012  . Depression   . Diabetes mellitus without complication (HCC)    borderline   . Edema 06/10/2010   Qualifier: Diagnosis of  By: [name], MD, [name]    . Erectile dysfunction 09/14/2012  . Essential hypertension 03/11/2008   Qualifier: Diagnosis of  By: [name] MD, [name]    . GERD (gastroesophageal reflux disease)    ocassional  . Gout    pt denies  . Gout, unspecified 05/01/2007   Qualifier:  Diagnosis of  By: [name] MD, [name]   . Headache    migraines in the past  . History of pancreatitis    a. admx 04-2009.Marland KitchenMarland Kitchen? 2-2 triglycerides  . Hyperlipidemia 07/11/2013  . Hypertension   . Hypertriglyceridemia    a. followed by [facility]  . HYPERTRIGLYCERIDEMIA 05/26/2009   Qualifier: Diagnosis of  By: [name] MD, [name]   . Left sided sciatica 09/14/2012  . Metabolic syndrome   . METABOLIC SYNDROME X 3/81/0175   Qualifier: Diagnosis of  By: [name]    . Neuropathy   . Nocturia 02/17/2015  . NONSPECIFIC ABNORMAL ELECTROCARDIOGRAM 04/08/2010   Qualifier: Diagnosis of  By: [name], RN, BSN, [name]    . Obesity   . OSA (obstructive sleep apnea)    CPAP- not current  . Restless legs   . Type 2 diabetes mellitus ([place]) 03/04/2015  . Type 2 diabetes mellitus with diabetic neuropathy, unspecified ([place]) 09/20/2019  . Urinary frequency 07/24/2015    Past Surgical History:  Procedure Laterality Date  . COLONOSCOPY    . KNEE SURGERY Right    x3 scopes  . KNEE SURGERY Right    open menisicus repair  . LUMBAR LAMINECTOMY/DECOMPRESSION MICRODISCECTOMY N/A 08/26/2015   Procedure: Lumbar Two-Sacral One  Laminectomy for decompression;  Surgeon: [name], MD;  Location: [place] [facility]UR[facility] ORS;  Service: Neurosurgery;  Laterality: N/A;  . pericardectomy  07/15/2010   Dr. [name]  . pleurx catheter placement  07/15/2010  Dr. [name]  . SHOULDER SURGERY     right and left shoulders- Rotator cuff repair  . TONSILLECTOMY AND ADENOIDECTOMY     one tonsil    Family History  Problem Relation Age of Onset  . Diabetes Mother   . Diabetes Father   . Colon cancer Father 65  . Heart disease Father 18       CABG  . Pancreatitis Father   . Hypertension Other        entire family  . Liver cancer Paternal Uncle   . Diabetes Sister   . Stomach cancer Maternal Grandfather   . Pancreatitis Paternal Uncle        x 2  . Esophageal cancer Neg Hx   . Rectal cancer Neg Hx   . Colon  polyps Neg Hx    Social History:  reports that he has been smoking cigarettes. He has a 11.88 pack-year smoking history. He has never used smokeless tobacco. He reports current alcohol use. He reports that he does not use drugs.  Allergies:  Allergies  Allergen Reactions  . Other Itching, Swelling and Palpitations    Pecans: itching and swelling of the tongue   . Diphenhydramine Itching, Palpitations and Other (See Comments)    "jittery"  . Colchicine Diarrhea  . Lipitor [Atorvastatin] Other (See Comments)    Muscle cramps  . Septra [Sulfamethoxazole-Trimethoprim] Itching    Medications Prior to Admission  Medication Sig Dispense Refill  . amLODipine (NORVASC) 10 MG tablet TAKE ONE TABLET BY MOUTH DAILY (Patient taking differently: Take 10 mg by mouth daily. ) 90 tablet 0  . ascorbic acid (VITAMIN C) 500 MG tablet Take 500 mg by mouth daily.    [name] aspirin EC 81 MG tablet Take 81 mg by mouth daily.    . citalopram (CELEXA) 40 MG tablet TAKE ONE TABLET BY MOUTH DAILY (Patient taking differently: Take 40 mg by mouth daily. ) 90 tablet 2  . diclofenac Sodium (VOLTAREN) 1 % GEL Apply 1 application topically 3 (three) times daily as needed (PAIN).     . fenofibrate (TRICOR) 145 MG tablet TAKE ONE TABLET BY MOUTH DAILY (Patient taking differently: Take 145 mg by mouth daily. ) 90 tablet 2  . fentaNYL (DURAGESIC - DOSED MCG/HR) 50 MCG/HR Place 50 mcg onto the skin every 3 (three) days.    . furosemide (LASIX) 40 MG tablet TAKE ONE TABLET BY MOUTH DAILY (Patient taking differently: Take 40 mg by mouth daily. ) 90 tablet 3  . gabapentin (NEURONTIN) 300 MG capsule Take 900 mg by mouth 3 (three) times daily.    [name] ibuprofen (ADVIL,MOTRIN) 800 MG tablet Take 800 mg by mouth in the morning, at noon, and at bedtime.     [name] lisinopril (ZESTRIL) 40 MG tablet TAKE ONE TABLET BY MOUTH DAILY (Patient taking differently: Take 40 mg by mouth daily. ) 90 tablet 2  . loratadine (CLARITIN) 10 MG tablet Take 10 mg  by mouth daily as needed for allergies.    . metFORMIN (GLUCOPHAGE-XR) 500 MG 24 hr tablet TAKE TWO TABLETS BY MOUTH DAILY WITH BREAKFAST (Patient taking differently: Take 1,000 mg by mouth daily. ) 180 tablet 2  . Multiple Minerals-Vitamins (CAL MAG ZINC +D3 PO) Take 1 tablet by mouth daily.    . Multiple Vitamin (MULTIVITAMIN WITH MINERALS) TABS tablet Take 1 tablet by mouth daily.    [name] oxyCODONE-acetaminophen (PERCOCET) 10-325 MG tablet Take 1 tablet by mouth every 6 (six) hours as needed for pain.  90 tablet 0  . Potassium Chloride ER 20 MEQ TBCR TAKE ONE TABLET BY MOUTH DAILY (Patient taking differently: Take 20 mEq by mouth daily. ) 90 tablet 2  . rosuvastatin (CRESTOR) 20 MG tablet TAKE ONE TABLET BY MOUTH DAILY (Patient taking differently: Take 20 mg by mouth daily. ) 90 tablet 0  . sildenafil (VIAGRA) 100 MG tablet Take 0.5-1 tablets (50-100 mg total) by mouth daily as needed for erectile dysfunction. 5 tablet 11  . tiZANidine (ZANAFLEX) 4 MG tablet Take 4 mg by mouth 3 (three) times daily as needed for muscle spasms.     [name] tolterodine (DETROL) 2 MG tablet TAKE 1 TABLET TWICE DAILY (Patient taking differently: Take 2 mg by mouth at bedtime. ) 180 tablet 1  . traMADol (ULTRAM) 50 MG tablet Take 50 mg by mouth every 6 (six) hours as needed (PAIN.).    [name] vitamin B-12 (CYANOCOBALAMIN) 1000 MCG tablet Take 1 tablet (1,000 mcg total) by mouth daily. 90 tablet 1  . ammonium lactate (AMLACTIN) 12 % lotion Apply 1 application topically as needed for dry skin. (Patient not taking: Reported on 12/26/2019) 400 g 3  . Blood Glucose Monitoring Suppl (ONE TOUCH ULTRA 2) w/Device KIT Use as directed daily E11.9 1 each 0  . clotrimazole-betamethasone (LOTRISONE) cream Use as directed twice daily as needed 15 g 1  . fluconazole (DIFLUCAN) 100 MG tablet 1 tablet daily by mouth (Patient not taking: Reported on 12/25/2019) 7 tablet 0  . glucose blood test strip Use as instructed once daily E119 100 each 12  .  Lancets MISC Use as directed daily E11.9 100 each 11  . nystatin (MYCOSTATIN) 100000 UNIT/ML suspension Take 5 mLs (500,000 Units total) by mouth 4 (four) times daily for 10 days. 180 mL 01  . terbinafine (LAMISIL) 250 MG tablet Take 1 tablet (250 mg total) by mouth daily. (Patient taking differently: Take 250 mg by mouth daily as needed. ) 90 tablet 1    Results for orders placed or performed during the hospital encounter of 01/08/20 (from the past 48 hour(s))  Glucose, capillary     Status: Abnormal   Collection Time: 01/08/20  7:22 AM  Result Value Ref Range   Glucose-Capillary 126 (H) 70 - 99 mg/dL    Comment: Glucose reference range applies only to samples taken after fasting for at least 8 hours.   No results found.  Review of Systems  Musculoskeletal: Positive for back pain.  Neurological: Positive for numbness.    Blood pressure (!) 175/103, pulse 62, temperature 98.7 F (37.1 C), temperature source Oral, resp. rate 20, height _0  (1.803 m), weight 118.3 kg, SpO2 97 %. Physical Exam HENT:     Head: Normocephalic.     Nose: Nose normal.  Eyes:     Pupils: Pupils are equal, round, and reactive to light.  Cardiovascular:     Rate and Rhythm: Normal rate.  Pulmonary:     Effort: Pulmonary effort is normal.  Abdominal:     General: Abdomen is flat.  Musculoskeletal:        General: Normal range of motion.  Skin:    General: Skin is warm.  Neurological:     Mental Status: He is alert.     Comments: Patient is awake and alert strength is 5 out of 5 iliopsoas, quads, hamstrings, gastroc, tibialis, and EHL.      Assessment/Plan 54 year old presents for decompression stabilization procedure L3-L5  Elaina Hoops, MD 01/08/2020, 8:02 AM

## 2020-01-08 NOTE — Transfer of Care (Signed)
Immediate Anesthesia Transfer of Care Note  Patient: Harold [name]  Procedure(s) Performed: LUMBAR THREE-FOUR, LUMBAR FOUR-FIVE POSTERIOR LUMBAR INTERBODY FUSION (N/A Spine Lumbar)  Patient Location: PACU  Anesthesia Type:General  Level of Consciousness: drowsy and patient cooperative  Airway & Oxygen Therapy: Patient Spontanous Breathing and Patient connected to nasal cannula oxygen  Post-op Assessment: Report given to RN and Post -op Vital signs reviewed and stable  Post vital signs: Reviewed and stable  Last Vitals:  Vitals Value Taken Time  BP 146/100 01/08/20 1627  Temp    Pulse 79 01/08/20 1631  Resp 33 01/08/20 1631  SpO2 95 % 01/08/20 1631  Vitals shown include unvalidated device data.  Last Pain:  Vitals:   01/08/20 0724  TempSrc: Oral  PainSc:          Complications: No complications documented.

## 2020-01-08 NOTE — Op Note (Signed)
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  Posterior lumbar interbody fusion L3-4 L4-5 utilizing the globus titanium insert and rotate cages packed with locally harvested autograft mixed with vivigen  3.  Pedicle screw fixation L3-L5 utilizing the globus Creo modular pedicle screw set  4.  Posterior lateral arthrodesis L3-L5 utilizing locally harvested autograft mixed with vivigen  Surgeon: Dominica Severin Rehman Levinson  Assistant: Ashok Pall  Anesthesia: General  EBL: 3000 cc with 900 cc given back with Cell Saver  HPI: Patient 54 year old gentleman who previously undergone decompressive laminectomies from L2-S1 and has had persistent back and bilateral L4-L5 radiculopathies repeat imaging showed very large disc herniations causing severe thecal sac compression spinal stenosis and diastases of his facet joints at L3-4 and L4-5 relatively normal preserved structure at 2 3 and 5 1.  Due to the patient's progressive clinical syndrome imaging findings failed conservative treatment I recommended redo decompressive laminotomy and laminectomy from L3-L5 with posterior lumbar to by fusions at those levels.  I extensively with the risks and benefits of that operation with him as well as perioperative course expectations of outcome and alternatives of surgery and he understood and agreed to proceed forward.  Operative procedure: Patient was brought into the OR was used and general anesthesia positioned prone the Wilson frame his back was prepped and draped in routine sterile fashion his old incision was opened up and the scar tissue was dissected free identify the  spinous process and lamina of L2 this gave me some bony landmarks and I tracked the facet joints from 2334 and 4 5 down to the superior aspect of L5-S1 there was an extensive amount of epidural fibrosis and scar tissue so I developed a plane underneath residual laminotomy defect at L3 performed a complete medial facetectomy of 3 4 as well as 4 5 tract the L3-L4 and L5 nerve roots extensively out the foramen.  I did this bilaterally working extensively through all scar tissue decompressing central canal then inspected the disc bases incised the disc bases bilaterally cleaned a lot of central disc out at both levels prepared the endplates and utilizing sequential distraction with an 11 distractor in place I selected 9-12 insert and rotate titanium cages and inserted them under fluoroscopy.  I had extensive mount of autograft mix was packed centrally between the cages fluoroscopy confirmed good position of the implants then I placed 6 pedicle screws in routine fashion again fluoroscopy confirmed good position of the screws then I aggressively decorticated the TPs and lateral gutters and placed the autograft mix as well as allograft strips posterior laterally from L3 L5 reinspected the foramen decompression the foramina all appear to be patent lay Gelfoam in the dura placed a large Hemovac drain closed the wound with interrupted Vicryl was injected Exparel in the fascia.  Then closed the subcutaneous layer and subcuticular with 4-0 Vicryl Dermabond benzoin Steri-Strips and a sterile dressing was applied and patient recovery in stable condition.  At the end the case all needle counts and sponge counts were correct.

## 2020-01-09 LAB — GLUCOSE, CAPILLARY
Glucose-Capillary: 150 mg/dL — ABNORMAL HIGH (ref 70–99)
Glucose-Capillary: 103 mg/dL — ABNORMAL HIGH (ref 70–99)
Glucose-Capillary: 125 mg/dL — ABNORMAL HIGH (ref 70–99)
Glucose-Capillary: 94 mg/dL (ref 70–99)

## 2020-01-09 LAB — CBC WITH DIFFERENTIAL/PLATELET
Abs Immature Granulocytes: 0.1 10*3/uL — ABNORMAL HIGH (ref 0.00–0.07)
Basophils Absolute: 0 10*3/uL (ref 0.0–0.1)
Basophils Relative: 0 %
Eosinophils Absolute: 0 10*3/uL (ref 0.0–0.5)
Eosinophils Relative: 0 %
HCT: 38 % — ABNORMAL LOW (ref 39.0–52.0)
Hemoglobin: 12.5 g/dL — ABNORMAL LOW (ref 13.0–17.0)
Immature Granulocytes: 1 %
Lymphocytes Relative: 11 %
Lymphs Abs: 2 10*3/uL (ref 0.7–4.0)
MCH: 32.7 pg (ref 26.0–34.0)
MCHC: 32.9 g/dL (ref 30.0–36.0)
MCV: 99.5 fL (ref 80.0–100.0)
Monocytes Absolute: 2.1 10*3/uL — ABNORMAL HIGH (ref 0.1–1.0)
Monocytes Relative: 11 %
Neutro Abs: 14.7 10*3/uL — ABNORMAL HIGH (ref 1.7–7.7)
Neutrophils Relative %: 77 %
Platelets: 264 10*3/uL (ref 150–400)
RBC: 3.82 MIL/uL — ABNORMAL LOW (ref 4.22–5.81)
RDW: 13.1 % (ref 11.5–15.5)
WBC: 19 10*3/uL — ABNORMAL HIGH (ref 4.0–10.5)
nRBC: 0 % (ref 0.0–0.2)

## 2020-01-09 MED ORDER — OXYCODONE-ACETAMINOPHEN 10-325 MG PO TABS: 1.0000 | ORAL_TABLET | Freq: Four times a day (QID) | ORAL | 0 refills | Status: DC | PRN

## 2020-01-09 MED ORDER — METHOCARBAMOL 500 MG PO TABS: 500.0000 mg | ORAL_TABLET | Freq: Four times a day (QID) | ORAL | 0 refills | Status: DC

## 2020-01-09 MED FILL — Sodium Chloride IV Soln 0.9%: INTRAVENOUS | Qty: 2000 | Status: AC

## 2020-01-09 MED FILL — Sodium Chloride Irrigation Soln 0.9%: Qty: 3000 | Status: AC

## 2020-01-09 MED FILL — Heparin Sodium (Porcine) Inj 1000 Unit/ML: INTRAMUSCULAR | Qty: 60 | Status: AC

## 2020-01-09 NOTE — TOC Progression Note (Signed)
Transition of Care [facility]) - Progression Note    Patient Details  Name: Harold [name] MRN: [id number] Date of Birth: December 19, 1965  [facility]) CM/SW Contact  Angelita Ingles, RN Phone Number: [phone]  01/09/2020, 9:18 AM  Clinical Narrative:    Patient is not a code 3. Verified with utilization review nurse Alexander Mt RN        Expected Discharge Plan and Services           Expected Discharge Date: 01/09/20                                     Social Determinants of Health (SDOH) Interventions    Readmission Risk Interventions No flowsheet data found.

## 2020-01-09 NOTE — Anesthesia Postprocedure Evaluation (Signed)
Anesthesia Post Note  Patient: Harold [name]  Procedure(s) Performed: LUMBAR THREE-FOUR, LUMBAR FOUR-FIVE POSTERIOR LUMBAR INTERBODY FUSION (N/A Spine Lumbar)     Patient location during evaluation: PACU Anesthesia Type: General Level of consciousness: awake and alert Pain management: pain level controlled Vital Signs Assessment: post-procedure vital signs reviewed and stable Respiratory status: spontaneous breathing, nonlabored ventilation, respiratory function stable and patient connected to nasal cannula oxygen Cardiovascular status: blood pressure returned to baseline and stable Postop Assessment: no apparent nausea or vomiting Anesthetic complications: no   No complications documented.  Last Vitals:  Vitals:   01/09/20 0431 01/09/20 0802  BP: (!) 162/99 (!) 157/98  Pulse: 85 89  Resp: 20 18  Temp: 37.2 C 36.8 C  SpO2: 99% 97%    Last Pain:  Vitals:   01/09/20 0802  TempSrc: Oral  PainSc:                  [name]

## 2020-01-09 NOTE — Evaluation (Signed)
Physical Therapy Evaluation Patient Details Name: Harold [name] MRN: [id number] DOB: 1965-12-27 Today's Date: 01/09/2020   History of Present Illness  54 y.o. male presenting wiht bilateral hip and leg pain 2/2 severe spinal stenosis from large disc herniation at L3-4 and L4-5. Patient s/p PLIF L3-4 L4-5. PMHx significant for decompressive laminectomy.   Clinical Impression  Pt admitted with above diagnosis. He is POD #1 and presents with decreased mobility, strength, endurance, safety, and balance.  Pt was very lethargic that limited mobility and adherence to precautions (requiring increased cues). Pt with support at home and can stay on the first level if needed.  Expected to progress well with therapy. Pt currently with functional limitations due to the deficits listed below (see PT Problem List). Pt will benefit from skilled PT to increase their independence and safety with mobility to allow discharge to the venue listed below.       Follow Up Recommendations Follow surgeon's recommendation for DC plan and follow-up therapies;Supervision/Assistance - 24 hour    Equipment Recommendations  Rolling walker with 5" wheels;3in1 (PT)    Recommendations for Other Services       Precautions / Restrictions Precautions Precautions: Back Precaution Booklet Issued: Yes (comment) Required Braces or Orthoses: Spinal Brace Spinal Brace: Lumbar corset;Applied in sitting position Restrictions Weight Bearing Restrictions: No      Mobility  Bed Mobility Overal bed mobility: Needs Assistance Bed Mobility: Rolling;Sidelying to Sit;Sit to Sidelying Rolling: Supervision Sidelying to sit: Min assist   Sit to supine: Min assist   General bed mobility comments: Supervision for rolling R<>L with cues for use of log rolling technique. Min A for supine to sit for trunk, and for legs back to supine; frequent cues not to twist  Transfers Overall transfer level: Needs assistance Equipment used:  Rolling walker (2 wheeled) Transfers: Sit to/from Stand Sit to Stand: Min guard;From elevated surface         General transfer comment: bed significantly elevated; cues for hand placement  Ambulation/Gait Ambulation/Gait assistance: Min guard Gait Distance (Feet): 150 Feet Assistive device: Rolling walker (2 wheeled) Gait Pattern/deviations: Step-through pattern;Trunk flexed Gait velocity: decreased   General Gait Details: cued for posture and RW proxmity  Stairs            Wheelchair Mobility    Modified Rankin (Stroke Patients Only)       Balance Overall balance assessment: Needs assistance Sitting-balance support: Feet supported;No upper extremity supported Sitting balance-Leahy Scale: Fair     Standing balance support: Bilateral upper extremity supported;No upper extremity supported Standing balance-Leahy Scale: Poor Standing balance comment: reliant of RW or leaning on counter when washing hands                             Pertinent Vitals/Pain Pain Assessment: 0-10 Pain Score: 4  Pain Location: Back at incision Pain Descriptors / Indicators: Sore Pain Intervention(s): Limited activity within patient's tolerance;Monitored during session    Home Living Family/patient expects to be discharged to:: Private residence Living Arrangements: Spouse/significant other;Children Available Help at Discharge: Family;Available 24 hours/day Type of Home: House Home Access: Stairs to enter Entrance Stairs-Rails: Right;Left;Can reach both Entrance Stairs-Number of Steps: 3 Home Layout: Two level;Able to live on main level with bedroom/bathroom Home Equipment: Kasandra Knudsen - single point;Walker - 2 wheels;Electric scooter;Other (comment) Additional Comments: reacher    Prior Function Level of Independence: Needs assistance   Gait / Transfers Assistance Needed: Patient reports use of  SPC in home and wc vs. hoveround in community dwellings. Patient reports  requiring assist for stair negotiation at baseline (able to ascend/descend 3STE home without assist).   ADL's / Homemaking Assistance Needed: Patient notes assist to don/doff footwear at baseline. Patient also reports assist for IADLs including cooking/cleaning. Patient was not working but was able to drive.         Hand Dominance   Dominant Hand: Right    Extremity/Trunk Assessment   Upper Extremity Assessment Upper Extremity Assessment: Overall WFL for tasks assessed    Lower Extremity Assessment Lower Extremity Assessment: Overall WFL for tasks assessed (MMT at least 3/5 but not further tested due to acuity of sx)    Cervical / Trunk Assessment Cervical / Trunk Assessment: Other exceptions Cervical / Trunk Exceptions: back precautions  Communication   Communication: No difficulties  Cognition Arousal/Alertness: Lethargic;Suspect due to medications Behavior During Therapy: Flat affect Overall Cognitive Status: Within Functional Limits for tasks assessed                                 General Comments: increased cues for precautions - suspect needed due to lethargy from meds (reports had muscle relaxer)      General Comments General comments (skin integrity, edema, etc.): Educated on back precautions and gave handout.    Exercises     Assessment/Plan    PT Assessment Patient needs continued PT services  PT Problem List Decreased strength;Decreased mobility;Decreased range of motion;Decreased coordination;Decreased knowledge of precautions;Decreased activity tolerance;Decreased safety awareness;Decreased balance;Decreased knowledge of use of DME;Pain       PT Treatment Interventions DME instruction;Therapeutic activities;Modalities;Gait training;Therapeutic exercise;Patient/family education;Balance training;Functional mobility training;Stair training    PT Goals (Current goals can be found in the Care Plan section)  Acute Rehab PT Goals Patient Stated  Goal: To reduce pain PT Goal Formulation: With patient Time For Goal Achievement: 01/23/20 Potential to Achieve Goals: Good    Frequency Min 5X/week   Barriers to discharge        Co-evaluation               AM-PAC PT "6 Clicks" Mobility  Outcome Measure Help needed turning from your back to your side while in a flat bed without using bedrails?: A Little Help needed moving from lying on your back to sitting on the side of a flat bed without using bedrails?: A Little Help needed moving to and from a bed to a chair (including a wheelchair)?: A Little Help needed standing up from a chair using your arms (e.g., wheelchair or bedside chair)?: A Little Help needed to walk in hospital room?: A Little Help needed climbing 3-5 steps with a railing? : A Little 6 Click Score: 18    End of Session Equipment Utilized During Treatment: Gait belt;Back brace Activity Tolerance: Patient limited by lethargy Patient left: in bed;with call bell/phone within reach;with bed alarm set Nurse Communication: Mobility status PT Visit Diagnosis: Other abnormalities of gait and mobility (R26.89)    Time: 2585-2778 PT Time Calculation (min) (ACUTE ONLY): 26 min   Charges:   PT Evaluation $PT Eval Low Complexity: 1 Low PT Treatments $Gait Training: 8-22 mins        Harold [name], PT Acute Rehab Services Pager [phone] Harold [name] Re[facility] [phone]    Harold [name] 01/09/2020, 2:13 PM

## 2020-01-09 NOTE — Discharge Instructions (Signed)
Wound Care  Keep the incision clean and dry remove the outer dressing in 2 days, leave the Steri-Strips intact.  Do not put any creams, lotions, or ointments on incision. Leave steri-strips on back.  They will fall off by themselves.  Activity Walk each and every day, increasing distance each day. No lifting greater than 5 lbs.  No lifting no bending no twisting no driving or riding a car unless coming back and forth to see me. If provided with back brace, wear when out of bed.  It is not necessary to wear brace in bed. Diet Resume your normal diet.   Return to Work Will be discussed at you follow up appointment.  Call Your Doctor If Any of These Occur Redness, drainage, or swelling at the wound.  Temperature greater than 101 degrees. Severe pain not relieved by pain medication. Incision starts to come apart. Follow Up Appt Call today for appointment in 1-2 weeks ([phone]) or for problems.  If you have any hardware placed in your spine, you will need an x-ray before your appointment.   

## 2020-01-09 NOTE — Progress Notes (Signed)
Occupational Therapy Evaluation Patient Details Name: Harold [name] MRN: [id number] DOB: [birth year] Today's Date: 01/09/2020    History of Present Illness 54 y.o. male presenting wiht bilateral hip and leg pain 2/2 severe spinal stenosis from large disc herniation at L3-4 and L4-5. Patient s/p PLIF L3-4 L4-5. PMHx significant for decompressive laminectomy.    Clinical Impression   PTA patient was living with his significant other in a 2-level private residence with 3 STE. Patient reports requiring assist with LB ADLs and IADLs including meal prep and housekeeping. Patient was ambulating in home with use of SPC and in community dwellings with use of wc vs Hoveround. Patient currently presents below baseline level of function demonstrating Min A grossly for BADLs, Min guard to Min A for ADL transfers, and Min A for bed mobility. Patient also limited by pain and lethargy. Patient would benefit from continued acute OT services to maximize safety and independence with self-care tasks in prep for safe return home with his significant other who is able to provide 24hr supervision/assis upon d/c.     Follow Up Recommendations  Home health OT;Supervision/Assistance - 24 hour    Equipment Recommendations  Tub/shower bench    Recommendations for Other Services       Precautions / Restrictions Precautions Precautions: Back Precaution Booklet Issued: Yes (comment) Required Braces or Orthoses: Spinal Brace Spinal Brace: Lumbar corset;Applied in sitting position Restrictions Weight Bearing Restrictions: No      Mobility Bed Mobility Overal bed mobility: Needs Assistance Bed Mobility: Rolling;Sidelying to Sit;Sit to Supine Rolling: Supervision Sidelying to sit: Min assist   Sit to supine: Min assist   General bed mobility comments: Supervision for rolling R<>L with cues for use of log rolling technique. Min A for supine <> EOB at BLE with cues for adherence to back precautions.    Transfers Overall transfer level: Needs assistance Equipment used: Rolling walker (2 wheeled) Transfers: Sit to/from [employer] Sit to Stand: Min guard;Min assist;From elevated surface Stand pivot transfers: Min guard;Min assist            Balance Overall balance assessment: Needs assistance Sitting-balance support: Feet supported;Single extremity supported Sitting balance-Leahy Scale: Fair     Standing balance support: Bilateral upper extremity supported Standing balance-Leahy Scale: Poor Standing balance comment: Reliant on BUE on RW.                            ADL either performed or assessed with clinical judgement   ADL Overall ADL's : Needs assistance/impaired Eating/Feeding: Independent   Grooming: Min guard;Standing Grooming Details (indicate cue type and reason): Patient completed 2/3 grooming tasks standing at sink level with use of RW. Slight DOE.      Lower Body Bathing: Minimal assistance;Sit to/from stand;Sitting/lateral leans   Upper Body Dressing : Set up;Sitting Upper Body Dressing Details (indicate cue type and reason): Patient able to don UB clothing seated EOB.  Lower Body Dressing: Minimal assistance;Sit to/from stand;Sitting/lateral leans Lower Body Dressing Details (indicate cue type and reason): Patient able to don underwear/pants seated EOB with Min A. Able to attain/maintain figure-4 position.  Toilet Transfer: Minimal Buyer, retail Details (indicate cue type and reason): Min A for toilet transfer with cues for walker management.  Toileting- Water quality scientist and Hygiene: Min guard;Sit to/from stand Toileting - Clothing Manipulation Details (indicate cue type and reason): Patient able to void bladder in standing with Min guard for balance/safety.  Functional mobility during ADLs: Min guard;Minimal assistance;Rolling walker       Vision         Perception     Praxis       Pertinent Vitals/Pain Pain Assessment: 0-10 Pain Score: 8  Pain Location: Back at incision Pain Descriptors / Indicators: Guarding;Grimacing;Sharp Pain Intervention(s): Limited activity within patient's tolerance;Monitored during session;Premedicated before session;Repositioned;Relaxation     Hand Dominance Right   Extremity/Trunk Assessment Upper Extremity Assessment Upper Extremity Assessment: Overall WFL for tasks assessed   Lower Extremity Assessment Lower Extremity Assessment: Defer to PT evaluation   Cervical / Trunk Assessment Cervical / Trunk Assessment: Other exceptions (Forward head)   Communication Communication Communication: No difficulties   Cognition Arousal/Alertness: Lethargic;Suspect due to medications Behavior During Therapy: Flat affect Overall Cognitive Status: Within Functional Limits for tasks assessed                                     General Comments       Exercises     Shoulder Instructions      Home Living Family/patient expects to be discharged to:: Private residence Living Arrangements: Spouse/significant other;Children Available Help at Discharge: Family;Available 24 hours/day Type of Home: House Home Access: Stairs to enter [employer] of Steps: 3 STE from garage without Flat Lick: Two level Alternate Level Stairs-Number of Steps: Full-flight to 2nd level    Bathroom Shower/Tub: Teacher, early years/pre: Standard Bathroom Accessibility: Yes How Accessible: Accessible via walker Home Equipment: [place] - single point;Walker - 2 wheels;Electric scooter;Other (comment) (Reacher)          Prior Functioning/Environment Level of Independence: Needs assistance  Gait / Transfers Assistance Needed: Patient reports use of SPC in home and wc vs. hoveround in community dwellings. Patient reports requiring assist for stair negotiation at baseline (able to ascend/descend 3STE home without  assist).  ADL's / Homemaking Assistance Needed: Patient notes assist to don/doff footwear at baseline. Patient also reports assist for IADLs including cooking/cleaning. Patient was not working but was able to drive.             OT Problem List: Decreased activity tolerance;Impaired balance (sitting and/or standing);Decreased knowledge of use of DME or AE;Decreased knowledge of precautions;Pain      OT Treatment/Interventions: Self-care/ADL training;Therapeutic exercise;Energy conservation;DME and/or AE instruction;Therapeutic activities;Patient/family education;Balance training    OT Goals(Current goals can be found in the care plan section) Acute Rehab OT Goals Patient Stated Goal: To reduce pain OT Goal Formulation: With patient Time For Goal Achievement: 01/23/20 Potential to Achieve Goals: Good ADL Goals Pt Will Perform Grooming: with modified independence;standing Pt Will Perform Upper Body Dressing: with modified independence;sitting;standing Pt Will Perform Lower Body Dressing: with modified independence;sitting/lateral leans;sit to/from stand;with adaptive equipment Pt Will Transfer to Toilet: with modified independence;bedside commode;ambulating Pt Will Perform Toileting - Clothing Manipulation and hygiene: with modified independence;sitting/lateral leans;sit to/from stand  OT Frequency: Min 2X/week   Barriers to D/C:            Co-evaluation              AM-PAC OT "6 Clicks" Daily Activity     Outcome Measure Help from another person eating meals?: None Help from another person taking care of personal grooming?: A Little Help from another person toileting, which includes using toliet, bedpan, or urinal?: A Little Help from another person bathing (including washing, rinsing,  drying)?: A Lot Help from another person to put on and taking off regular upper body clothing?: None Help from another person to put on and taking off regular lower body clothing?: A  Little 6 Click Score: 19   End of Session Equipment Utilized During Treatment: Rolling walker;Gait belt Nurse Communication: Mobility status  Activity Tolerance: Patient limited by lethargy;Patient limited by pain Patient left: in bed;with call bell/phone within reach  OT Visit Diagnosis: Other abnormalities of gait and mobility (R26.89);Muscle weakness (generalized) (M62.81);History of falling (Z91.81);Pain Pain - part of body:  (Back)                Time: 1751-0258 OT Time Calculation (min): 35 min Charges:  OT General Charges $OT Visit: 1 Visit OT Evaluation $OT Eval Moderate Complexity: 1 Mod OT Treatments $Self Care/Home Management : 8-22 mins  Harold Creps H. OTR/L Supplemental OT, Department of rehab services [phone]  Harold Eckersley R H. 01/09/2020, 10:07 AM

## 2020-01-09 NOTE — Discharge Summary (Signed)
Physician Discharge Summary  Patient ID: Harold [name] MRN: [id number] DOB/AGE: February 04, 1966 54 y.o.  Admit date: 01/08/2020 Discharge date: 01/09/2020  Admission Diagnoses: Herniated nucleus pulposus L3-4 L4-5 lumbar spinal stenosis L3-4 L4-5    Discharge Diagnoses: same  Discharged Condition: good  Hospital Course: The patient was admitted on 01/08/2020 and taken to the operating room where the patient underwent PLIF L3-4, L4-5. The patient tolerated the procedure well and was taken to the recovery room and then to the floor in stable condition. The hospital course was routine. There were no complications. The wound remained clean dry and intact. Pt had appropriate back soreness. No complaints of leg pain or new N/T/W. The patient remained afebrile with stable vital signs, and tolerated a regular diet. The patient continued to increase activities, and pain was well controlled with oral pain medications.   Consults: None  Significant Diagnostic Studies:  Results for orders placed or performed during the hospital encounter of 01/08/20  Glucose, capillary  Result Value Ref Range   Glucose-Capillary 126 (H) 70 - 99 mg/dL  Glucose, capillary  Result Value Ref Range   Glucose-Capillary 195 (H) 70 - 99 mg/dL  Hemoglobin and hematocrit, blood  Result Value Ref Range   Hemoglobin 12.9 (L) 13.0 - 17.0 g/dL   HCT 41.0 39 - 52 %  Glucose, capillary  Result Value Ref Range   Glucose-Capillary 198 (H) 70 - 99 mg/dL  CBC with Differential/Platelet  Result Value Ref Range   WBC 19.0 (H) 4.0 - 10.5 K/uL   RBC 3.82 (L) 4.22 - 5.81 MIL/uL   Hemoglobin 12.5 (L) 13.0 - 17.0 g/dL   HCT 38.0 (L) 39 - 52 %   MCV 99.5 80.0 - 100.0 fL   MCH 32.7 26.0 - 34.0 pg   MCHC 32.9 30.0 - 36.0 g/dL   RDW 13.1 11.5 - 15.5 %   Platelets 264 150 - 400 K/uL   nRBC 0.0 0.0 - 0.2 %   Neutrophils Relative % 77 %   Neutro Abs 14.7 (H) 1.7 - 7.7 K/uL   Lymphocytes Relative 11 %   Lymphs Abs 2.0 0.7 - 4.0 K/uL    Monocytes Relative 11 %   Monocytes Absolute 2.1 (H) 0.1 - 1.0 K/uL   Eosinophils Relative 0 %   Eosinophils Absolute 0.0 0.0 - 0.5 K/uL   Basophils Relative 0 %   Basophils Absolute 0.0 0.0 - 0.1 K/uL   Immature Granulocytes 1 %   Abs Immature Granulocytes 0.10 (H) 0.00 - 0.07 K/uL  Glucose, capillary  Result Value Ref Range   Glucose-Capillary 186 (H) 70 - 99 mg/dL   Comment 1 Notify RN    Comment 2 Document in Chart   Glucose, capillary  Result Value Ref Range   Glucose-Capillary 150 (H) 70 - 99 mg/dL   Comment 1 Notify RN    Comment 2 Document in Chart     DG Lumbar Spine 2-3 Views  Result Date: 01/08/2020 CLINICAL DATA:  Surgery, elective. Additional history provided: L3-L4-L5 PLIF. EXAM: LUMBAR SPINE - 2-3 VIEW; DG C-ARM 1-60 MIN COMPARISON:  MRI of the lumbar spine 10/04/2019. FINDINGS: PA and lateral view intraoperative fluoroscopic images of the lumbar spine are submitted, 2 images total. The images demonstrate bilateral pedicle screws at what are presumed to be the L3, L4 and L5 levels and interbody spacers at what are presumed to be the L3-L4 and L4-L5 levels (the levels are difficult to ascertained given the field of view). Vertical interconnecting rods  were not present at the time the images were taken. Overlying retractors. IMPRESSION: Two intraoperative fluoroscopic images of the lumbar spine as described. Electronically Signed   By: Kellie Simmering DO   On: 01/08/2020 16:51   DG C-Arm 1-60 Min  Result Date: 01/08/2020 CLINICAL DATA:  Surgery, elective. Additional history provided: L3-L4-L5 PLIF. EXAM: LUMBAR SPINE - 2-3 VIEW; DG C-ARM 1-60 MIN COMPARISON:  MRI of the lumbar spine 10/04/2019. FINDINGS: PA and lateral view intraoperative fluoroscopic images of the lumbar spine are submitted, 2 images total. The images demonstrate bilateral pedicle screws at what are presumed to be the L3, L4 and L5 levels and interbody spacers at what are presumed to be the L3-L4 and L4-L5  levels (the levels are difficult to ascertained given the field of view). Vertical interconnecting rods were not present at the time the images were taken. Overlying retractors. IMPRESSION: Two intraoperative fluoroscopic images of the lumbar spine as described. Electronically Signed   By: Kellie Simmering DO   On: 01/08/2020 16:51   [place] LIMITED JOINT SPACE STRUCTURES LOW RIGHT(NO LINKED CHARGES)  Result Date: 12/26/2019 Procedure: Real-time Ultrasound Guided Injection of right knee superior lateral patellar space Device: Philips Affiniti 50G Images permanently stored and available for review in PACS Verbal informed consent obtained. Discussed risks and benefits of procedure. Warned about infection bleeding damage to structures skin hypopigmentation and fat atrophy among others. Patient expresses understanding and agreement Time-out conducted.  Noted no overlying erythema, induration, or other signs of local infection.  Skin prepped in a sterile fashion.  Local anesthesia: Topical Ethyl chloride.  With sterile technique and under real time ultrasound guidance: 40 mg of Kenalog and 2 mils of Marcaine injected into joint. Fluid seen entering the joint.  Completed without difficulty  Pain partially resolved suggesting accurate placement of the medication.  Advised to call if fevers/chills, erythema, induration, drainage, or persistent bleeding.  Images permanently stored and available for review in the ultrasound unit. Impression: Technically successful ultrasound guided injection.  DG Knee AP/LAT W/Sunrise Right  Result Date: 12/27/2019 CLINICAL DATA:  Generalized pain EXAM: RIGHT KNEE 3 VIEWS COMPARISON:  July 05, 2016. FINDINGS: Frontal, lateral, and sunrise patellar images were obtained. There is no fracture or dislocation. No appreciable joint effusion. There is severe joint space narrowing medially. There is mild narrowing in the patellofemoral joint. Spurring is noted medially and to a lesser  degree in the patellofemoral joint region. No erosion. IMPRESSION: Osteoarthritic change, most marked medially, similar to prior study. No fracture or dislocation. No appreciable joint effusion. Electronically Signed   By: Lowella Grip III M.D.   On: 12/27/2019 11:53    Antibiotics:  Anti-infectives (From admission, onward)   Start     Dose/Rate Route Frequency Ordered Stop   01/08/20 2130  ceFAZolin (ANCEF) IVPB 2g/100 mL premix        2 g 200 mL/hr over 30 Minutes Intravenous Every 8 hours 01/08/20 2020 01/09/20 0526   01/08/20 2115  terbinafine (LAMISIL) tablet 250 mg  Status:  Discontinued        250 mg Oral Daily 01/08/20 2019 01/08/20 2045   01/08/20 0645  ceFAZolin (ANCEF) IVPB 2g/100 mL premix        2 g 200 mL/hr over 30 Minutes Intravenous On call to O.R. 01/08/20 0645 01/08/20 1315      Discharge Exam: Blood pressure (!) 162/99, pulse 85, temperature 99 F (37.2 C), temperature source Oral, resp. rate 20, height '5\' 11"'  (1.803 m), weight 118.3  kg, SpO2 99 %. Neurologic: Grossly normal Ambulating and voiding well, incision cdi  Discharge Medications:   Allergies as of 01/09/2020      Reactions   Other Itching, Swelling, Palpitations   Pecans: itching and swelling of the tongue    Diphenhydramine Itching, Palpitations, Other (See Comments)   "jittery"   Colchicine Diarrhea   Lipitor [atorvastatin] Other (See Comments)   Muscle cramps   Septra [sulfamethoxazole-trimethoprim] Itching      Medication List    STOP taking these medications   ibuprofen 800 MG tablet Commonly known as: ADVIL   traMADol 50 MG tablet Commonly known as: ULTRAM     TAKE these medications   amLODipine 10 MG tablet Commonly known as: NORVASC TAKE ONE TABLET BY MOUTH DAILY   ammonium lactate 12 % lotion Commonly known as: AmLactin Apply 1 application topically as needed for dry skin.   ascorbic acid 500 MG tablet Commonly known as: VITAMIN C Take 500 mg by mouth daily.    aspirin EC 81 MG tablet Take 81 mg by mouth daily.   CAL MAG ZINC +D3 PO Take 1 tablet by mouth daily.   citalopram 40 MG tablet Commonly known as: CELEXA TAKE ONE TABLET BY MOUTH DAILY   clotrimazole-betamethasone cream Commonly known as: Lotrisone Use as directed twice daily as needed   diclofenac Sodium 1 % Gel Commonly known as: VOLTAREN Apply 1 application topically 3 (three) times daily as needed (PAIN).   fenofibrate 145 MG tablet Commonly known as: TRICOR TAKE ONE TABLET BY MOUTH DAILY   fentaNYL 50 MCG/HR Commonly known as: DURAGESIC Place 50 mcg onto the skin every 3 (three) days.   fluconazole 100 MG tablet Commonly known as: DIFLUCAN 1 tablet daily by mouth   furosemide 40 MG tablet Commonly known as: LASIX TAKE ONE TABLET BY MOUTH DAILY   gabapentin 300 MG capsule Commonly known as: NEURONTIN Take 900 mg by mouth 3 (three) times daily.   glucose blood test strip Use as instructed once daily E119   Lancets Misc Use as directed daily E11.9   lisinopril 40 MG tablet Commonly known as: ZESTRIL TAKE ONE TABLET BY MOUTH DAILY   loratadine 10 MG tablet Commonly known as: CLARITIN Take 10 mg by mouth daily as needed for allergies.   metFORMIN 500 MG 24 hr tablet Commonly known as: GLUCOPHAGE-XR TAKE TWO TABLETS BY MOUTH DAILY WITH BREAKFAST What changed: See the new instructions.   methocarbamol 500 MG tablet Commonly known as: Robaxin Take 1 tablet (500 mg total) by mouth 4 (four) times daily.   multivitamin with minerals Tabs tablet Take 1 tablet by mouth daily.   nystatin 100000 UNIT/ML suspension Commonly known as: MYCOSTATIN Take 5 mLs (500,000 Units total) by mouth 4 (four) times daily for 10 days.   ONE TOUCH ULTRA 2 w/Device Kit Use as directed daily E11.9   oxyCODONE-acetaminophen 10-325 MG tablet Commonly known as: PERCOCET Take 1 tablet by mouth every 6 (six) hours as needed for pain.   Potassium Chloride ER 20 MEQ  Tbcr TAKE ONE TABLET BY MOUTH DAILY What changed: how much to take   rosuvastatin 20 MG tablet Commonly known as: CRESTOR TAKE ONE TABLET BY MOUTH DAILY   sildenafil 100 MG tablet Commonly known as: Viagra Take 0.5-1 tablets (50-100 mg total) by mouth daily as needed for erectile dysfunction.   terbinafine 250 MG tablet Commonly known as: LAMISIL Take 1 tablet (250 mg total) by mouth daily. What changed:   when to  take this  reasons to take this   tiZANidine 4 MG tablet Commonly known as: ZANAFLEX Take 4 mg by mouth 3 (three) times daily as needed for muscle spasms.   tolterodine 2 MG tablet Commonly known as: DETROL TAKE 1 TABLET TWICE DAILY What changed: when to take this   vitamin B-12 1000 MCG tablet Commonly known as: CYANOCOBALAMIN Take 1 tablet (1,000 mcg total) by mouth daily.       Disposition: home   Final Dx: PLIF L3-4, L4-5  Discharge Instructions     Remove dressing in 72 hours   Complete by: As directed    Call MD for:  difficulty breathing, headache or visual disturbances   Complete by: As directed    Call MD for:  hives   Complete by: As directed    Call MD for:  persistant dizziness or light-headedness   Complete by: As directed    Call MD for:  persistant nausea and vomiting   Complete by: As directed    Call MD for:  redness, tenderness, or signs of infection (pain, swelling, redness, odor or green/yellow discharge around incision site)   Complete by: As directed    Call MD for:  severe uncontrolled pain   Complete by: As directed    Call MD for:  temperature >100.4   Complete by: As directed    Diet - low sodium heart healthy   Complete by: As directed    Driving Restrictions   Complete by: As directed    No driving for 2 weeks, no riding in the car for 1 week   Increase activity slowly   Complete by: As directed    Lifting restrictions   Complete by: As directed    No lifting more than 8 lbs         Signed: Ocie Cornfield Advait Buice 01/09/2020, 7:55 AM

## 2020-01-10 LAB — GLUCOSE, CAPILLARY: Glucose-Capillary: 113 mg/dL — ABNORMAL HIGH (ref 70–99)

## 2020-01-10 NOTE — Progress Notes (Signed)
Patient is discharged from room 3C10 at this time. Alert and in stable condition. IV site d/c'd and instructions read to patient and spouse with understanding verbalized and all questions answered. Left unit via wheelchair with all belongings at side. 

## 2020-01-10 NOTE — Discharge Summary (Signed)
Physician Discharge Summary  Patient ID: Harold [name] MRN: [id number] DOB/AGE: [birth year] 52 y.o.  Admit date: 01/08/2020 Discharge date: 01/10/2020  Admission Diagnoses: Herniated nucleus pulposus lumbar spinal stenosis and degenerative disc disease L3-4 L4-5  Discharge Diagnoses: Same Active Problems:   HNP (herniated nucleus pulposus), lumbar   Discharged Condition: good  Hospital Course: Patient is admitted to hospital underwent decompressive laminectomy L3-4 L4-5 posterior lumbar interbody fusions at those levels and postoperative patient did fairly well recovering the floor on the floor was ambulating and voiding spontaneously postop hematocrit was stable patient stable for discharge with home health physical therapy and follow-up in 2 weeks.  Patient already has pain medication at home  Consults: None  Significant Diagnostic Studies:  Treatments: Posterior lumbar interbody fusions L3-4 L4-5  Discharge Exam: Blood pressure 126/68, pulse (!) 58, temperature 98.9 F (37.2 C), temperature source Oral, resp. rate 20, height 5' 11" (1.803 m), weight 118.3 kg, SpO2 95 %. Strength 5 out of 5 wound clean dry and intact  Disposition: Discharge disposition: 01-Home or Self Care       Discharge Instructions     Remove dressing in 72 hours   Complete by: As directed    Call MD for:  difficulty breathing, headache or visual disturbances   Complete by: As directed    Call MD for:  hives   Complete by: As directed    Call MD for:  persistant dizziness or light-headedness   Complete by: As directed    Call MD for:  persistant nausea and vomiting   Complete by: As directed    Call MD for:  redness, tenderness, or signs of infection (pain, swelling, redness, odor or green/yellow discharge around incision site)   Complete by: As directed    Call MD for:  severe uncontrolled pain   Complete by: As directed    Call MD for:  temperature >100.4   Complete by: As directed     Diet - low sodium heart healthy   Complete by: As directed    Driving Restrictions   Complete by: As directed    No driving for 2 weeks, no riding in the car for 1 week   Face-to-face encounter (required for Medicare/Medicaid patients)   Complete by: As directed    I Elaina Hoops certify that this patient is under my care and that I, or a nurse practitioner or physician's assistant working with me, had a face-to-face encounter that meets the physician face-to-face encounter requirements with this patient on 01/10/2020. The encounter with the patient was in whole, or in part for the following medical condition(s) which is the primary reason for home health care (List medical condition): Herniated nucleus pulposus lumbar spinal stenosis degenerative disc disease   The encounter with the patient was in whole, or in part, for the following medical condition, which is the primary reason for home health care: Degenerative disc disease herniated nucleus pulposus and lumbar spinal stenosis   I certify that, based on my findings, the following services are medically necessary home health services: Physical therapy   Reason for Medically Necessary Home Health Services: Therapy- Therapeutic Exercises to Increase Strength and Endurance   My clinical findings support the need for the above services: Unable to leave home safely without assistance and/or assistive device   Further, I certify that my clinical findings support that this patient is homebound due to: Unable to leave home safely without assistance   Home Health   Complete by: As directed  To provide the following care/treatments:  OT PT     Increase activity slowly   Complete by: As directed    Lifting restrictions   Complete by: As directed    No lifting more than 8 lbs   Walker rolling   Complete by: As directed      Allergies as of 01/10/2020      Reactions   Other Itching, Swelling, Palpitations   Pecans: itching and swelling of the  tongue    Diphenhydramine Itching, Palpitations, Other (See Comments)   "jittery"   Colchicine Diarrhea   Lipitor [atorvastatin] Other (See Comments)   Muscle cramps   Septra [sulfamethoxazole-trimethoprim] Itching      Medication List    STOP taking these medications   ibuprofen 800 MG tablet Commonly known as: ADVIL   traMADol 50 MG tablet Commonly known as: ULTRAM     TAKE these medications   amLODipine 10 MG tablet Commonly known as: NORVASC TAKE ONE TABLET BY MOUTH DAILY   ammonium lactate 12 % lotion Commonly known as: AmLactin Apply 1 application topically as needed for dry skin.   ascorbic acid 500 MG tablet Commonly known as: VITAMIN C Take 500 mg by mouth daily.   aspirin EC 81 MG tablet Take 81 mg by mouth daily.   CAL MAG ZINC +D3 PO Take 1 tablet by mouth daily.   citalopram 40 MG tablet Commonly known as: CELEXA TAKE ONE TABLET BY MOUTH DAILY   clotrimazole-betamethasone cream Commonly known as: Lotrisone Use as directed twice daily as needed   diclofenac Sodium 1 % Gel Commonly known as: VOLTAREN Apply 1 application topically 3 (three) times daily as needed (PAIN).   fenofibrate 145 MG tablet Commonly known as: TRICOR TAKE ONE TABLET BY MOUTH DAILY   fentaNYL 50 MCG/HR Commonly known as: DURAGESIC Place 50 mcg onto the skin every 3 (three) days.   fluconazole 100 MG tablet Commonly known as: DIFLUCAN 1 tablet daily by mouth   furosemide 40 MG tablet Commonly known as: LASIX TAKE ONE TABLET BY MOUTH DAILY   gabapentin 300 MG capsule Commonly known as: NEURONTIN Take 900 mg by mouth 3 (three) times daily.   glucose blood test strip Use as instructed once daily E119   Lancets Misc Use as directed daily E11.9   lisinopril 40 MG tablet Commonly known as: ZESTRIL TAKE ONE TABLET BY MOUTH DAILY   loratadine 10 MG tablet Commonly known as: CLARITIN Take 10 mg by mouth daily as needed for allergies.   metFORMIN 500 MG 24 hr  tablet Commonly known as: GLUCOPHAGE-XR TAKE TWO TABLETS BY MOUTH DAILY WITH BREAKFAST What changed: See the new instructions.   methocarbamol 500 MG tablet Commonly known as: Robaxin Take 1 tablet (500 mg total) by mouth 4 (four) times daily.   multivitamin with minerals Tabs tablet Take 1 tablet by mouth daily.   nystatin 100000 UNIT/ML suspension Commonly known as: MYCOSTATIN Take 5 mLs (500,000 Units total) by mouth 4 (four) times daily for 10 days.   ONE TOUCH ULTRA 2 w/Device Kit Use as directed daily E11.9   oxyCODONE-acetaminophen 10-325 MG tablet Commonly known as: PERCOCET Take 1 tablet by mouth every 6 (six) hours as needed for pain.   Potassium Chloride ER 20 MEQ Tbcr TAKE ONE TABLET BY MOUTH DAILY What changed: how much to take   rosuvastatin 20 MG tablet Commonly known as: CRESTOR TAKE ONE TABLET BY MOUTH DAILY   sildenafil 100 MG tablet Commonly known as: Viagra Take  0.5-1 tablets (50-100 mg total) by mouth daily as needed for erectile dysfunction.   terbinafine 250 MG tablet Commonly known as: LAMISIL Take 1 tablet (250 mg total) by mouth daily. What changed:   when to take this  reasons to take this   tiZANidine 4 MG tablet Commonly known as: ZANAFLEX Take 4 mg by mouth 3 (three) times daily as needed for muscle spasms.   tolterodine 2 MG tablet Commonly known as: DETROL TAKE 1 TABLET TWICE DAILY What changed: when to take this   vitamin B-12 1000 MCG tablet Commonly known as: CYANOCOBALAMIN Take 1 tablet (1,000 mcg total) by mouth daily.        Signed: Elaina Hoops 01/10/2020, 7:29 AM

## 2020-01-10 NOTE — Progress Notes (Signed)
Physical Therapy Treatment Patient Details Name: Harold [name] MRN: [id number] DOB: 11/11/1965 Today's Date: 01/10/2020    History of Present Illness 54 y.o. male presenting wiht bilateral hip and leg pain 2/2 severe spinal stenosis from large disc herniation at L3-4 and L4-5. Patient s/p PLIF L3-4 L4-5. PMHx significant for decompressive laminectomy.     PT Comments    Pt progressing well with post-op mobility. He was able to demonstrate transfers and ambulation with gross min guard assist progressing to supervision for safety. Pt was educated on precautions, brace application/wearing schedule, appropriate activity progression, and car transfer. Will continue to follow.      Follow Up Recommendations  No PT follow up;Supervision for mobility/OOB     Equipment Recommendations  Rolling walker with 5" wheels    Recommendations for Other Services       Precautions / Restrictions Precautions Precautions: Back Precaution Booklet Issued: Yes (comment) Required Braces or Orthoses: Spinal Brace Spinal Brace: Lumbar corset;Applied in sitting position Restrictions Weight Bearing Restrictions: No    Mobility  Bed Mobility Overal bed mobility: Needs Assistance Bed Mobility: Rolling;Sidelying to Sit;Sit to Sidelying Rolling: Modified independent (Device/Increase time) Sidelying to sit: Supervision     Sit to sidelying: Supervision General bed mobility comments: Light supervision and VC's for optimal log roll technique. Pt required increased time and use of railing for support. HOB flat to simulate home environment.   Transfers Overall transfer level: Needs assistance Equipment used: Rolling walker (2 wheeled) Transfers: Sit to/from Stand Sit to Stand: Supervision         General transfer comment: VC's for hand placement on seated surface for safety. No assist required but supervision provided for safety.   Ambulation/Gait Ambulation/Gait assistance: Min  Gaffer (Feet): 250 Feet Assistive device: Rolling walker (2 wheeled) Gait Pattern/deviations: Step-through pattern;Trunk flexed Gait velocity: decreased Gait velocity interpretation: <1.31 ft/sec, indicative of household ambulator General Gait Details: VC's for improved posture and bilateral shoulder depression to neutral. Pt moving slow but generally steady with the RW for support. Min guard assist progressing to supervision for safety.    Stairs Stairs: Yes Stairs assistance: Min guard Stair Management: Two rails;Step to pattern;Forwards Number of Stairs: 10 General stair comments: VC's for improved posture and sequencing for optimal pain control. Pt completed well without overt LOB.    Wheelchair Mobility    Modified Rankin (Stroke Patients Only)       Balance Overall balance assessment: Needs assistance Sitting-balance support: Feet supported;No upper extremity supported Sitting balance-Leahy Scale: Fair     Standing balance support: Bilateral upper extremity supported;No upper extremity supported Standing balance-Leahy Scale: Poor Standing balance comment: Reliant on UE support for standing activity.                             Cognition Arousal/Alertness: Awake/alert Behavior During Therapy: Flat affect Overall Cognitive Status: Within Functional Limits for tasks assessed                                 General Comments: increased cues for precautions - suspect needed due to lethargy from meds (reports had muscle relaxer)      Exercises      General Comments        Pertinent Vitals/Pain Pain Assessment: 0-10 Pain Score: 7  Pain Location: Back at incision Pain Descriptors / Indicators: Sore Pain Intervention(s): Limited activity within  patient's tolerance;Monitored during session;Repositioned    Home Living                      Prior Function            PT Goals (current goals can now be  found in the care plan section) Acute Rehab PT Goals Patient Stated Goal: To reduce pain PT Goal Formulation: With patient Time For Goal Achievement: 01/23/20 Potential to Achieve Goals: Good Progress towards PT goals: Progressing toward goals    Frequency    Min 5X/week      PT Plan Current plan remains appropriate    Co-evaluation              AM-PAC PT "6 Clicks" Mobility   Outcome Measure  Help needed turning from your back to your side while in a flat bed without using bedrails?: None Help needed moving from lying on your back to sitting on the side of a flat bed without using bedrails?: None Help needed moving to and from a bed to a chair (including a wheelchair)?: None Help needed standing up from a chair using your arms (e.g., wheelchair or bedside chair)?: None Help needed to walk in hospital room?: None Help needed climbing 3-5 steps with a railing? : A Little 6 Click Score: 23    End of Session Equipment Utilized During Treatment: Gait belt;Back brace Activity Tolerance: Patient tolerated treatment well Patient left: in bed;with call bell/phone within reach;with bed alarm set Nurse Communication: Mobility status PT Visit Diagnosis: Other abnormalities of gait and mobility (R26.89)     Time: 3748-2707 PT Time Calculation (min) (ACUTE ONLY): 25 min  Charges:  $Gait Training: 23-37 mins                     Rolinda Roan, PT, DPT Acute Rehabilitation Services Pager: [phone] Office: [phone]    Thelma Comp 01/10/2020, 9:57 AM

## 2020-01-10 NOTE — Progress Notes (Signed)
Occupational Therapy Treatment Patient Details Name: Harold [name] MRN: [id number] DOB: [birth year] Today's Date: 01/10/2020    History of present illness 54 y.o. male presenting wiht bilateral hip and leg pain 2/2 severe spinal stenosis from large disc herniation at L3-4 and L4-5. Patient s/p PLIF L3-4 L4-5. PMHx significant for decompressive laminectomy.    OT comments  Pt. Seen for skilled OT.  Pt. Able to complete bed mobility with good demo of log roll and maintaining precautions throughout.  Donned brace with out assistance and able to complete LB dressing without use of A/E.  Reports having previous back sx. And remembers the techniques and modifications needed from that.  Reports good family support and assistance at home.  Eager for d/c home when able.   Follow Up Recommendations  Home health OT;Supervision/Assistance - 24 hour    Equipment Recommendations       Recommendations for Other Services      Precautions / Restrictions Precautions Precautions: Back Precaution Booklet Issued: Yes (comment) Required Braces or Orthoses: Spinal Brace Spinal Brace: Lumbar corset;Applied in sitting position Restrictions Weight Bearing Restrictions: No       Mobility Bed Mobility Overal bed mobility: Needs Assistance Bed Mobility: Rolling;Sidelying to Sit;Sit to Sidelying Rolling: Modified independent (Device/Increase time) Sidelying to sit: Supervision     Sit to sidelying: Supervision General bed mobility comments: Light supervision and VC's for optimal log roll technique. Pt required increased time and use of railing for support. HOB flat to simulate home environment.   Transfers Overall transfer level: Needs assistance Equipment used: Rolling walker (2 wheeled) Transfers: Sit to/from Stand Sit to Stand: Supervision         General transfer comment: VC's for hand placement on seated surface for safety. No assist required but supervision provided for safety.      Balance Overall balance assessment: Needs assistance Sitting-balance support: Feet supported;No upper extremity supported Sitting balance-Leahy Scale: Fair     Standing balance support: Bilateral upper extremity supported;No upper extremity supported Standing balance-Leahy Scale: Poor Standing balance comment: Reliant on UE support for standing activity.                            ADL either performed or assessed with clinical judgement   ADL Overall ADL's : Needs assistance/impaired                 Upper Body Dressing : Set up;Sitting Upper Body Dressing Details (indicate cue type and reason): able to don brace without assistance Lower Body Dressing: Set up;Sitting/lateral leans Lower Body Dressing Details (indicate cue type and reason): able to sit eob and cross each leg over knee to reach bles for donning socks. reports he also uses this positioning for when he donned his shorts-reports previous sx. so he knows how to do all of this Toilet Transfer: Designer, television/film set Details (indicate cue type and reason): simulated in room declined actual ambulation to the b.room       Tub/Shower Transfer Details (indicate cue type and reason): reports he has a shower stall for use at home Functional mobility during ADLs: Min guard;Rolling walker General ADL Comments: moving well, reports family assistance available at home     Vision       Perception     Praxis      Cognition Arousal/Alertness: Awake/alert Behavior During Therapy: Flat affect Overall Cognitive Status: Within Functional Limits for tasks assessed  General Comments: increased cues for precautions - suspect needed due to lethargy from meds (reports had muscle relaxer)        Exercises     Shoulder Instructions       General Comments      Pertinent Vitals/ Pain       Pain Assessment: Faces Pain Score: 7  Faces Pain Scale: Hurts little  more Pain Location: Back at incision Pain Descriptors / Indicators: Sore Pain Intervention(s): Limited activity within patient's tolerance;Monitored during session;Premedicated before session;Repositioned  Home Living                                          Prior Functioning/Environment              Frequency  Min 2X/week        Progress Toward Goals  OT Goals(current goals can now be found in the care plan section)  Progress towards OT goals: Progressing toward goals  Acute Rehab OT Goals Patient Stated Goal: To reduce pain  Plan      Co-evaluation                 AM-PAC OT "6 Clicks" Daily Activity     Outcome Measure   Help from another person eating meals?: None Help from another person taking care of personal grooming?: A Little Help from another person toileting, which includes using toliet, bedpan, or urinal?: A Little Help from another person bathing (including washing, rinsing, drying)?: A Lot Help from another person to put on and taking off regular upper body clothing?: None Help from another person to put on and taking off regular lower body clothing?: A Little 6 Click Score: 19    End of Session Equipment Utilized During Treatment: Rolling walker;Gait belt;Back brace  OT Visit Diagnosis: Other abnormalities of gait and mobility (R26.89);Muscle weakness (generalized) (M62.81);History of falling (Z91.81);Pain   Activity Tolerance Patient tolerated treatment well   Patient Left in bed;with call bell/phone within reach;Other (comment) (pt. requests to sit eob)   Nurse Communication          Time: 8280-0349 OT Time Calculation (min): 11 min  Charges: OT General Charges $OT Visit: 1 Visit OT Treatments $Self Care/Home Management : 8-22 mins  Sonia Baller, COTA/L Acute Rehabilitation [phone]   Janice Coffin 01/10/2020, 12:03 PM

## 2020-01-17 DIAGNOSIS — R2689 Other abnormalities of gait and mobility: Secondary | ICD-10-CM | POA: Diagnosis not present

## 2020-01-17 DIAGNOSIS — G894 Chronic pain syndrome: Secondary | ICD-10-CM | POA: Diagnosis not present

## 2020-01-17 DIAGNOSIS — M961 Postlaminectomy syndrome, not elsewhere classified: Secondary | ICD-10-CM | POA: Diagnosis not present

## 2020-01-17 DIAGNOSIS — M545 Low back pain, unspecified: Secondary | ICD-10-CM | POA: Diagnosis not present

## 2020-01-17 DIAGNOSIS — G9009 Other idiopathic peripheral autonomic neuropathy: Secondary | ICD-10-CM | POA: Diagnosis not present

## 2020-01-30 ENCOUNTER — Ambulatory Visit: Payer: Medicare HMO | Admitting: Cardiovascular Disease

## 2020-01-31 DIAGNOSIS — M48061 Spinal stenosis, lumbar region without neurogenic claudication: Secondary | ICD-10-CM | POA: Diagnosis not present

## 2020-01-31 DIAGNOSIS — M47817 Spondylosis without myelopathy or radiculopathy, lumbosacral region: Secondary | ICD-10-CM | POA: Diagnosis not present

## 2020-01-31 DIAGNOSIS — M961 Postlaminectomy syndrome, not elsewhere classified: Secondary | ICD-10-CM | POA: Diagnosis not present

## 2020-02-08 ENCOUNTER — Other Ambulatory Visit: Payer: Self-pay | Admitting: Internal Medicine

## 2020-02-11 DIAGNOSIS — M47817 Spondylosis without myelopathy or radiculopathy, lumbosacral region: Secondary | ICD-10-CM | POA: Diagnosis not present

## 2020-02-12 MED ORDER — FLUCONAZOLE 100 MG PO TABS: ORAL_TABLET | ORAL | 0 refills | Status: DC

## 2020-02-12 MED ORDER — ROSUVASTATIN CALCIUM 20 MG PO TABS
20.0000 mg | ORAL_TABLET | Freq: Every day | ORAL | 0 refills | Status: DC
Start: 2020-02-12 — End: 2020-06-26

## 2020-02-12 MED ORDER — AMLODIPINE BESYLATE 10 MG PO TABS
10.0000 mg | ORAL_TABLET | Freq: Every day | ORAL | 0 refills | Status: DC
Start: 2020-02-12 — End: 2020-06-26

## 2020-02-17 DIAGNOSIS — G9009 Other idiopathic peripheral autonomic neuropathy: Secondary | ICD-10-CM | POA: Diagnosis not present

## 2020-02-17 DIAGNOSIS — R2689 Other abnormalities of gait and mobility: Secondary | ICD-10-CM | POA: Diagnosis not present

## 2020-02-17 DIAGNOSIS — G894 Chronic pain syndrome: Secondary | ICD-10-CM | POA: Diagnosis not present

## 2020-02-17 DIAGNOSIS — M961 Postlaminectomy syndrome, not elsewhere classified: Secondary | ICD-10-CM | POA: Diagnosis not present

## 2020-02-17 DIAGNOSIS — M545 Low back pain, unspecified: Secondary | ICD-10-CM | POA: Diagnosis not present

## 2020-02-25 ENCOUNTER — Encounter: Payer: Self-pay | Admitting: Internal Medicine

## 2020-02-26 ENCOUNTER — Telehealth: Payer: Self-pay

## 2020-02-26 NOTE — Telephone Encounter (Signed)
Pt called to schedule video visit with Dr Jenny Reichmann on Fri (pt prefer to meet with Dr Jenny Reichmann only).  Pt encouraged to get COVID tested today at pharmacy; declined offer for testing at [facility] for tomorrow; states he will try to get it done at the pharmacy today.

## 2020-02-28 ENCOUNTER — Telehealth (INDEPENDENT_AMBULATORY_CARE_PROVIDER_SITE_OTHER): Payer: Medicare HMO | Admitting: Internal Medicine

## 2020-02-28 DIAGNOSIS — J309 Allergic rhinitis, unspecified: Secondary | ICD-10-CM | POA: Diagnosis not present

## 2020-02-28 DIAGNOSIS — J329 Chronic sinusitis, unspecified: Secondary | ICD-10-CM

## 2020-02-28 DIAGNOSIS — E1165 Type 2 diabetes mellitus with hyperglycemia: Secondary | ICD-10-CM

## 2020-02-28 MED ORDER — LEVOFLOXACIN 500 MG PO TABS: 500.0000 mg | ORAL_TABLET | Freq: Every day | ORAL | 0 refills | Status: AC

## 2020-02-28 MED ORDER — PREDNISONE 10 MG PO TABS: ORAL_TABLET | ORAL | 0 refills | Status: DC

## 2020-02-28 MED ORDER — METFORMIN HCL ER 500 MG PO TB24
ORAL_TABLET | ORAL | 3 refills | Status: DC
Start: 2020-02-28 — End: 2020-11-25

## 2020-02-28 NOTE — Patient Instructions (Signed)
Please take all new medication as prescribed - the antibiotic and the prednisone  Please continue all other medications as before, and refills have been done if requested - the metformin  Please have the pharmacy call with any other refills you may need.  Please continue your efforts at being more active, low cholesterol diet, and weight control..  Please keep your appointments with your specialists as you may have planned

## 2020-02-28 NOTE — Progress Notes (Signed)
   Subjective:    Patient ID: Harold [name], male    DOB: [birth year], 54 y.o.   MRN: [id number]  HPI    Review of Systems     Objective:   Physical Exam        Assessment & Plan:

## 2020-02-29 ENCOUNTER — Encounter: Payer: Self-pay | Admitting: Internal Medicine

## 2020-02-29 NOTE — Assessment & Plan Note (Signed)
/  Mild to mod, for predpac asd,  to f/u any worsening symptoms or concerns 

## 2020-02-29 NOTE — Assessment & Plan Note (Addendum)
Mild to mod, for antibx course,  to f/u any worsening symptoms or concerns  I spent 31 minutes in preparing to see the patient by review of recent labs, imaging and procedures, obtaining and reviewing separately obtained history, communicating with the patient and family or caregiver, ordering medications, tests or procedures, and documenting clinical information in the EHR including the differential Dx, treatment, and any further evaluation and other management of sinusitis, allergies, dm

## 2020-02-29 NOTE — Assessment & Plan Note (Signed)
Lab Results  Component Value Date   HGBA1C 6.7 (H) 01/06/2020  stable overall by history and exam, recent data reviewed with pt, and pt to continue medical treatment as before,  to f/u any worsening symptoms or concerns

## 2020-02-29 NOTE — Progress Notes (Signed)
Patient ID: Harold [name], male   DOB: 12-13-1965, 54 y.o.   MRN: [id number]  Virtual Visit via Video Note  I connected with Harold [name] on Feb 28, 2020 at  8:40 AM EST by a video enabled telemedicine application and verified that I am speaking with the correct person using two identifiers.  Location of all participants today Patient: at home Provider: at office   I discussed the limitations of evaluation and management by telemedicine and the availability of in person appointments. The patient expressed understanding and agreed to proceed.  History of Present Illness:  Here with 2-3 days acute onset fever, facial pain, pressure, headache, general weakness and malaise, and greenish d/c, with mild ST and cough, but pt denies chest pain, wheezing, increased sob or doe, orthopnea, PND, increased LE swelling, palpitations, dizziness or syncope.  Does have several wks ongoing nasal allergy symptoms with clearish congestion, itch and sneezing, without fever, pain, ST, cough, swelling or wheezing.   Pt denies polydipsia, polyuria, or low sugar symptoms.   Past Medical History:  Diagnosis Date  . Acute idiopathic pericarditis 04/08/2010   Qualifier: Diagnosis of  By: [name], [name]    . Acute pericarditis    admx 03-26-10 thru 03-29-10; a. echo 03-26-10: EF 55-60%; mild LVH; trivial MR; RVF ok; mild -mod circumferential Eff w/o tamponade  . Acute sinusitis 06/04/2014  . Allergic rhinitis, cause unspecified 09/14/2012  . Allergy   . Anxiety   . CHF (congestive heart failure) ([place]) 06/29/2010   was pericardititis not CHF  . Chronic back pain   . Chronic low back pain 08/06/2019  . Closed head injury with concussion   . Constipation   . 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 DDD (degenerative disc disease), lumbar 09/14/2012  . Depression   . Diabetes mellitus without complication (HCC)    borderline   . Edema 06/10/2010   Qualifier: Diagnosis of  By: [name], MD, [name]    . Erectile  dysfunction 09/14/2012  . Essential hypertension 03/11/2008   Qualifier: Diagnosis of  By: [name] MD, [name]    . GERD (gastroesophageal reflux disease)    ocassional  . Gout    pt denies  . Gout, unspecified 05/01/2007   Qualifier: Diagnosis of  By: [name] MD, [name]   . Headache    migraines in the past  . History of pancreatitis    a. admx 04-2009.Marland KitchenMarland Kitchen? 2-2 triglycerides  . Hyperlipidemia 07/11/2013  . Hypertension   . Hypertriglyceridemia    a. followed by [facility]  . HYPERTRIGLYCERIDEMIA 05/26/2009   Qualifier: Diagnosis of  By: [name] MD, [name]   . Left sided sciatica 09/14/2012  . Metabolic syndrome   . METABOLIC SYNDROME X 9/35/7017   Qualifier: Diagnosis of  By: [name]    . Neuropathy   . Nocturia 02/17/2015  . NONSPECIFIC ABNORMAL ELECTROCARDIOGRAM 04/08/2010   Qualifier: Diagnosis of  By: [name], RN, BSN, [name]    . Obesity   . OSA (obstructive sleep apnea)    CPAP- not current  . Restless legs   . Type 2 diabetes mellitus ([place]) 03/04/2015  . Type 2 diabetes mellitus with diabetic neuropathy, unspecified ([place]) 09/20/2019  . Urinary frequency 07/24/2015   Past Surgical History:  Procedure Laterality Date  . COLONOSCOPY    . KNEE SURGERY Right    x3 scopes  . KNEE SURGERY Right    open menisicus repair  . LUMBAR LAMINECTOMY/DECOMPRESSION MICRODISCECTOMY N/A 08/26/2015  Procedure: Lumbar Two-Sacral One  Laminectomy for decompression;  Surgeon: [name], MD;  Location: [facility];  Service: Neurosurgery;  Laterality: N/A;  . pericardectomy  07/15/2010   Dr. [name]  . pleurx catheter placement  07/15/2010   Dr. [name]  . SHOULDER SURGERY     right and left shoulders- Rotator cuff repair  . TONSILLECTOMY AND ADENOIDECTOMY     one tonsil    reports that he has been smoking cigarettes. He has a 11.88 pack-year smoking history. He has never used smokeless tobacco. He reports current alcohol use. He reports that he does not use  drugs. family history includes Colon cancer (age of onset: 21) in his father; Diabetes in his father, mother, and sister; Heart disease (age of onset: 24) in his father; Hypertension in an other family member; Liver cancer in his paternal uncle; Pancreatitis in his father and paternal uncle; Stomach cancer in his maternal grandfather. Allergies  Allergen Reactions  . Other Itching, Swelling and Palpitations    Pecans: itching and swelling of the tongue   . Diphenhydramine Itching, Palpitations and Other (See Comments)    "jittery"  . Colchicine Diarrhea  . Lipitor [Atorvastatin] Other (See Comments)    Muscle cramps  . Septra [Sulfamethoxazole-Trimethoprim] Itching   Current Outpatient Medications on File Prior to Visit  Medication Sig Dispense Refill  . amLODipine (NORVASC) 10 MG tablet Take 1 tablet (10 mg total) by mouth daily. 90 tablet 0  . ammonium lactate (AMLACTIN) 12 % lotion Apply 1 application topically as needed for dry skin. (Patient not taking: Reported on 12/26/2019) 400 g 3  . ascorbic acid (VITAMIN C) 500 MG tablet Take 500 mg by mouth daily.    [name] aspirin EC 81 MG tablet Take 81 mg by mouth daily.    . Blood Glucose Monitoring Suppl (ONE TOUCH ULTRA 2) w/Device KIT Use as directed daily E11.9 1 each 0  . citalopram (CELEXA) 40 MG tablet TAKE ONE TABLET BY MOUTH DAILY (Patient taking differently: Take 40 mg by mouth daily. ) 90 tablet 2  . clotrimazole-betamethasone (LOTRISONE) cream Use as directed twice daily as needed 15 g 1  . diclofenac Sodium (VOLTAREN) 1 % GEL Apply 1 application topically 3 (three) times daily as needed (PAIN).     . fenofibrate (TRICOR) 145 MG tablet TAKE ONE TABLET BY MOUTH DAILY (Patient taking differently: Take 145 mg by mouth daily. ) 90 tablet 2  . fentaNYL (DURAGESIC - DOSED MCG/HR) 50 MCG/HR Place 50 mcg onto the skin every 3 (three) days.    . fluconazole (DIFLUCAN) 100 MG tablet 1 tablet daily by mouth 7 tablet 0  . furosemide (LASIX) 40 MG  tablet TAKE ONE TABLET BY MOUTH DAILY (Patient taking differently: Take 40 mg by mouth daily. ) 90 tablet 3  . gabapentin (NEURONTIN) 300 MG capsule Take 900 mg by mouth 3 (three) times daily.    [name] glucose blood test strip Use as instructed once daily E119 100 each 12  . Lancets MISC Use as directed daily E11.9 100 each 11  . lisinopril (ZESTRIL) 40 MG tablet TAKE ONE TABLET BY MOUTH DAILY (Patient taking differently: Take 40 mg by mouth daily. ) 90 tablet 2  . loratadine (CLARITIN) 10 MG tablet Take 10 mg by mouth daily as needed for allergies.    . methocarbamol (ROBAXIN) 500 MG tablet Take 1 tablet (500 mg total) by mouth 4 (four) times daily. 45 tablet 0  . Multiple Minerals-Vitamins (CAL MAG ZINC +  D3 PO) Take 1 tablet by mouth daily.    . Multiple Vitamin (MULTIVITAMIN WITH MINERALS) TABS tablet Take 1 tablet by mouth daily.    [name] oxyCODONE-acetaminophen (PERCOCET) 10-325 MG tablet Take 1 tablet by mouth every 6 (six) hours as needed for pain. 30 tablet 0  . Potassium Chloride ER 20 MEQ TBCR TAKE ONE TABLET BY MOUTH DAILY (Patient taking differently: Take 20 mEq by mouth daily. ) 90 tablet 2  . rosuvastatin (CRESTOR) 20 MG tablet Take 1 tablet (20 mg total) by mouth daily. 90 tablet 0  . sildenafil (VIAGRA) 100 MG tablet Take 0.5-1 tablets (50-100 mg total) by mouth daily as needed for erectile dysfunction. 5 tablet 11  . terbinafine (LAMISIL) 250 MG tablet Take 1 tablet (250 mg total) by mouth daily. (Patient taking differently: Take 250 mg by mouth daily as needed. ) 90 tablet 1  . tiZANidine (ZANAFLEX) 4 MG tablet Take 4 mg by mouth 3 (three) times daily as needed for muscle spasms.     [name] tolterodine (DETROL) 2 MG tablet TAKE 1 TABLET TWICE DAILY (Patient taking differently: Take 2 mg by mouth at bedtime. ) 180 tablet 1  . vitamin B-12 (CYANOCOBALAMIN) 1000 MCG tablet Take 1 tablet (1,000 mcg total) by mouth daily. 90 tablet 1   No current facility-administered medications on file prior to  visit.     Observations/Objective: Alert, NAD, appropriate mood and affect, resps normal, cn 2-12 intact, moves all 4s, no visible rash or swelling Lab Results  Component Value Date   WBC 19.0 (H) 01/09/2020   HGB 12.5 (L) 01/09/2020   HCT 38.0 (L) 01/09/2020   PLT 264 01/09/2020   GLUCOSE 112 (H) 01/06/2020   CHOL 149 08/07/2019   TRIG 188.0 (H) 08/07/2019   HDL 26.60 (L) 08/07/2019   LDLDIRECT 53.0 02/13/2018   LDLCALC 85 08/07/2019   ALT 16 08/07/2019   AST 17 08/07/2019   NA 138 01/06/2020   K 3.6 01/06/2020   CL 102 01/06/2020   CREATININE 0.74 01/06/2020   BUN 10 01/06/2020   CO2 24 01/06/2020   TSH 0.47 08/07/2019   PSA 13.95 (H) 08/07/2019   INR 1.72 (H) 07/05/2010   HGBA1C 6.7 (H) 01/06/2020   MICROALBUR 0.8 02/13/2018   Assessment and Plan: See notes  Follow Up Instructions: See notes   I discussed the assessment and treatment plan with the patient. The patient was provided an opportunity to ask questions and all were answered. The patient agreed with the plan and demonstrated an understanding of the instructions.   The patient was advised to call back or seek an in-person evaluation if the symptoms worsen or if the condition fails to improve as anticipated.   Cathlean Cower, MD

## 2020-03-04 DIAGNOSIS — M175 Other unilateral secondary osteoarthritis of knee: Secondary | ICD-10-CM | POA: Diagnosis not present

## 2020-03-04 DIAGNOSIS — M25561 Pain in right knee: Secondary | ICD-10-CM | POA: Diagnosis not present

## 2020-03-10 DIAGNOSIS — M47817 Spondylosis without myelopathy or radiculopathy, lumbosacral region: Secondary | ICD-10-CM | POA: Diagnosis not present

## 2020-03-11 ENCOUNTER — Encounter: Payer: Self-pay | Admitting: Internal Medicine

## 2020-03-11 DIAGNOSIS — J329 Chronic sinusitis, unspecified: Secondary | ICD-10-CM

## 2020-03-12 MED ORDER — PREDNISONE 10 MG PO TABS
ORAL_TABLET | ORAL | 0 refills | Status: DC
Start: 2020-03-12 — End: 2020-07-03

## 2020-03-18 DIAGNOSIS — M545 Low back pain, unspecified: Secondary | ICD-10-CM | POA: Diagnosis not present

## 2020-03-18 DIAGNOSIS — G9009 Other idiopathic peripheral autonomic neuropathy: Secondary | ICD-10-CM | POA: Diagnosis not present

## 2020-03-18 DIAGNOSIS — R2689 Other abnormalities of gait and mobility: Secondary | ICD-10-CM | POA: Diagnosis not present

## 2020-03-18 DIAGNOSIS — M961 Postlaminectomy syndrome, not elsewhere classified: Secondary | ICD-10-CM | POA: Diagnosis not present

## 2020-03-18 DIAGNOSIS — G894 Chronic pain syndrome: Secondary | ICD-10-CM | POA: Diagnosis not present

## 2020-03-24 ENCOUNTER — Other Ambulatory Visit: Payer: Self-pay | Admitting: Internal Medicine

## 2020-03-26 ENCOUNTER — Telehealth (INDEPENDENT_AMBULATORY_CARE_PROVIDER_SITE_OTHER): Payer: Medicare HMO | Admitting: Internal Medicine

## 2020-03-26 ENCOUNTER — Other Ambulatory Visit: Payer: Self-pay

## 2020-03-26 ENCOUNTER — Encounter: Payer: Self-pay | Admitting: Internal Medicine

## 2020-03-26 DIAGNOSIS — J0141 Acute recurrent pansinusitis: Secondary | ICD-10-CM

## 2020-03-26 MED ORDER — AMOXICILLIN-POT CLAVULANATE 875-125 MG PO TABS: 1.0000 | ORAL_TABLET | Freq: Two times a day (BID) | ORAL | 0 refills | Status: DC

## 2020-03-26 NOTE — Assessment & Plan Note (Signed)
Rx augmentin.  

## 2020-03-26 NOTE — Progress Notes (Signed)
Virtual Visit via Video Note  I connected with Harold [name] on 03/26/20 at  3:20 PM EST by a video enabled telemedicine application and verified that I am speaking with the correct person using two identifiers.  The patient and the provider were at separate locations throughout the entire encounter. Patient location: home, Provider location: work   I discussed the limitations of evaluation and management by telemedicine and the availability of in person appointments. The patient expressed understanding and agreed to proceed. The patient and the provider were the only parties present for the visit unless noted in HPI below.  History of Present Illness: The patient is a 54 y.o. man with visit for sinus problems. Started beginning of December and was given antibiotics and steroids after e-visit. This did not help and given a second course of steroids mid-December as well as ENT referral. Has upcoming appointment with them but is suffering in the meantime. Having pain in the sinuses and drainage. Typically feels like he needs multiple antibiotics course to clear an infection. Denies fevers or chills. Overall it is not improving. Is taking otc allergy medications regularly  Observations/Objective: Appearance: normal, breathing appears normal no coughing during visit, pain to frontal sinuses to self palpation, mental status is A and O times 3  Assessment and Plan: See problem oriented charting  Follow Up Instructions: rx augmentin  I discussed the assessment and treatment plan with the patient. The patient was provided an opportunity to ask questions and all were answered. The patient agreed with the plan and demonstrated an understanding of the instructions.   The patient was advised to call back or seek an in-person evaluation if the symptoms worsen or if the condition fails to improve as anticipated.  Myrlene Broker, MD

## 2020-04-06 ENCOUNTER — Encounter: Payer: Self-pay | Admitting: Internal Medicine

## 2020-04-06 DIAGNOSIS — E1165 Type 2 diabetes mellitus with hyperglycemia: Secondary | ICD-10-CM

## 2020-04-07 DIAGNOSIS — I10 Essential (primary) hypertension: Secondary | ICD-10-CM

## 2020-04-07 MED ORDER — FUROSEMIDE 40 MG PO TABS: 40.0000 mg | ORAL_TABLET | Freq: Every day | ORAL | 3 refills | Status: DC

## 2020-04-09 ENCOUNTER — Encounter: Payer: Medicare HMO | Attending: Internal Medicine | Admitting: Dietician

## 2020-04-09 ENCOUNTER — Encounter: Payer: Self-pay | Admitting: Dietician

## 2020-04-09 ENCOUNTER — Other Ambulatory Visit: Payer: Self-pay

## 2020-04-09 DIAGNOSIS — E1165 Type 2 diabetes mellitus with hyperglycemia: Secondary | ICD-10-CM | POA: Insufficient documentation

## 2020-04-09 DIAGNOSIS — E1169 Type 2 diabetes mellitus with other specified complication: Secondary | ICD-10-CM

## 2020-04-09 NOTE — Progress Notes (Signed)
Diabetes Self-Management Education  Visit Type: First/Initial  Appt. Start Time: 1540 Appt. End Time: 1650  04/09/2020  [name], identified by name and date of birth, is a 55 y.o. male with a diagnosis of Diabetes: Type 2.   ASSESSMENT Pt is in school for his BA in business administration. Pt reports having an 75 year old son and wants to set a good example for him. Pt states he wants lifestyle changes. Wants to eat and feel healthy. Pt reports bouts of frequent urination that led him to want to make these changes. Pt states his A1c will be lower at his next visit. States he has been eliminating carbohydrates from his diet. Cutting down on the liquor, stopped drinking around the new year. Pt reports having numbness in his thumb in the past couple of weeks. Pt was on a course of prednisone for 9 days recently. Pt recently has back surgery for a hernia, states he was having numbness down his legs into his feet. States he can feel his feet now. Pt has someone his podiatrist approved to cut his toe nails. FBG 121 this morning Pt checks BG in the morning and at night. States his lowest was low 80's, highest was just over 200. Pt states his family has a history of pancreatitis. Pt states he has to fix his own food, used to get 2 meals a day. Typically eats one meal a day. Pt wants to swim and ride a bike for physical activity, and some light lifting. States he is cleared to do walking or swimming post-surgery.   Height 5\' 11"  (1.803 m), weight 260 lb 8 oz (118.2 kg). Body mass index is 36.33 kg/m.   Diabetes Self-Management Education - 04/09/20 1600      Visit Information   Visit Type First/Initial      Initial Visit   Diabetes Type Type 2    Are you currently following a meal plan? No    Are you taking your medications as prescribed? Yes    Date Diagnosed 2019      Health Coping   How would you rate your overall health? Poor      Psychosocial Assessment   Patient  Belief/Attitude about Diabetes Motivated to manage diabetes    Self-care barriers None    Other persons present Patient    Patient Concerns Nutrition/Meal planning;Healthy Lifestyle    Special Needs None    Preferred Learning Style No preference indicated    Learning Readiness Contemplating    How often do you need to have someone help you when you read instructions, pamphlets, or other written materials from your doctor or pharmacy? 1 - Never    What is the last grade level you completed in school? 2nd year college      Pre-Education Assessment   Patient understands the diabetes disease and treatment process. Needs Instruction    Patient understands incorporating nutritional management into lifestyle. Needs Instruction    Patient undertands incorporating physical activity into lifestyle. Needs Instruction    Patient understands using medications safely. Needs Instruction    Patient understands monitoring blood glucose, interpreting and using results Needs Instruction    Patient understands prevention, detection, and treatment of acute complications. Needs Instruction    Patient understands prevention, detection, and treatment of chronic complications. Needs Instruction    Patient understands how to develop strategies to address psychosocial issues. Needs Instruction    Patient understands how to develop strategies to promote health/change behavior. Needs Instruction  Complications   Last HgB A1C per patient/outside source 6.7 %   01/06/2020   How often do you check your blood sugar? 1-2 times/day    Fasting Blood glucose range (mg/dL) 70-129    Postprandial Blood glucose range (mg/dL) 130-179    Number of hypoglycemic episodes per month 0    Number of hyperglycemic episodes per week 0    Have you had a dilated eye exam in the past 12 months? Yes    Have you had a dental exam in the past 12 months? Yes    Are you checking your feet? Yes    How many days per week are you checking  your feet? 7      Dietary Intake   Breakfast none    Snack (morning) none    Lunch Double cheeseburger, fries, sprite    Snack (afternoon) none    Dinner none    Snack (evening) corn puffs with cheese    Beverage(s) sprite      Exercise   Exercise Type ADL's    How many days per week to you exercise? 0    How many minutes per day do you exercise? 0    Total minutes per week of exercise 0      Patient Education   Previous Diabetes Education No    Disease state  Definition of diabetes, type 1 and 2, and the diagnosis of diabetes    Nutrition management  Role of diet in the treatment of diabetes and the relationship between the three main macronutrients and blood glucose level;Carbohydrate counting;Effects of alcohol on blood glucose and safety factors with consumption of alcohol.    Medications Reviewed patients medication for diabetes, action, purpose, timing of dose and side effects.    Monitoring Purpose and frequency of SMBG.    Chronic complications Retinopathy and reason for yearly dilated eye exams;Nephropathy, what it is, prevention of, the use of ACE, ARB's and early detection of through urine microalbumia.;Dental care    Psychosocial adjustment Role of stress on diabetes    Personal strategies to promote health Lifestyle issues that need to be addressed for better diabetes care      Individualized Goals (developed by patient)   Nutrition Follow meal plan discussed    Physical Activity Not Applicable    Medications take my medication as prescribed    Monitoring  test my blood glucose as discussed      Post-Education Assessment   Patient understands the diabetes disease and treatment process. Needs Review    Patient understands incorporating nutritional management into lifestyle. Needs Review    Patient undertands incorporating physical activity into lifestyle. Needs Review    Patient understands using medications safely. Needs Review    Patient understands monitoring  blood glucose, interpreting and using results Needs Review    Patient understands prevention, detection, and treatment of acute complications. Needs Review    Patient understands prevention, detection, and treatment of chronic complications. Needs Review    Patient understands how to develop strategies to address psychosocial issues. Needs Review    Patient understands how to develop strategies to promote health/change behavior. Needs Review      Outcomes   Expected Outcomes Demonstrated interest in learning. Expect positive outcomes    Future DMSE 4-6 wks    Program Status Not Completed           Individualized Plan for Diabetes Self-Management Training:   Learning Objective:  Patient will have a greater understanding of diabetes  self-management. Patient education plan is to attend individual and/or group sessions per assessed needs and concerns.   Plan:   Patient Instructions  Work towards eating three meals a day, about 5-6 hours apart!  Begin to recognize carbohydrates in your food choices!  Have 3 carb choices at each meal (45 g).   Begin to build your meals using the proportions of the Balanced Plate. . First, select your carb choice(s) for the meal, and determine how much you should have to equal 3 carb choices (45 g). . Next, select your source of protein to pair with your carb choice(s). . Finally, complete the remaining half of your meal with a variety of non-starchy vegetables.  Check your blood sugar before you eat every morning, and 2 hours after a meal. Look at how your    Expected Outcomes:  Demonstrated interest in learning. Expect positive outcomes  Education material provided: Meal plan card and My Plate  If problems or questions, patient to contact team via:  Phone and Email  Future DSME appointment: 4-6 wks

## 2020-04-09 NOTE — Patient Instructions (Addendum)
Work towards eating three meals a day, about 5-6 hours apart!  Begin to recognize carbohydrates in your food choices!  Have 3 carb choices at each meal (45 g).   Begin to build your meals using the proportions of the Balanced Plate. . First, select your carb choice(s) for the meal, and determine how much you should have to equal 3 carb choices (45 g). . Next, select your source of protein to pair with your carb choice(s). . Finally, complete the remaining half of your meal with a variety of non-starchy vegetables.  Check your blood sugar before you eat every morning, and 2 hours after a meal. Look at how your

## 2020-04-15 DIAGNOSIS — M25511 Pain in right shoulder: Secondary | ICD-10-CM | POA: Diagnosis not present

## 2020-04-18 DIAGNOSIS — M545 Low back pain, unspecified: Secondary | ICD-10-CM | POA: Diagnosis not present

## 2020-04-18 DIAGNOSIS — R2689 Other abnormalities of gait and mobility: Secondary | ICD-10-CM | POA: Diagnosis not present

## 2020-04-18 DIAGNOSIS — M961 Postlaminectomy syndrome, not elsewhere classified: Secondary | ICD-10-CM | POA: Diagnosis not present

## 2020-04-18 DIAGNOSIS — G9009 Other idiopathic peripheral autonomic neuropathy: Secondary | ICD-10-CM | POA: Diagnosis not present

## 2020-04-18 DIAGNOSIS — G894 Chronic pain syndrome: Secondary | ICD-10-CM | POA: Diagnosis not present

## 2020-04-27 ENCOUNTER — Other Ambulatory Visit: Payer: Self-pay | Admitting: Internal Medicine

## 2020-04-28 ENCOUNTER — Other Ambulatory Visit: Payer: Self-pay | Admitting: Internal Medicine

## 2020-04-28 DIAGNOSIS — N529 Male erectile dysfunction, unspecified: Secondary | ICD-10-CM

## 2020-04-28 DIAGNOSIS — M1711 Unilateral primary osteoarthritis, right knee: Secondary | ICD-10-CM | POA: Diagnosis not present

## 2020-04-29 DIAGNOSIS — M25511 Pain in right shoulder: Secondary | ICD-10-CM | POA: Diagnosis not present

## 2020-04-29 DIAGNOSIS — M542 Cervicalgia: Secondary | ICD-10-CM | POA: Diagnosis not present

## 2020-04-30 ENCOUNTER — Other Ambulatory Visit: Payer: Self-pay | Admitting: Orthopaedic Surgery

## 2020-04-30 DIAGNOSIS — M25511 Pain in right shoulder: Secondary | ICD-10-CM

## 2020-04-30 DIAGNOSIS — M542 Cervicalgia: Secondary | ICD-10-CM

## 2020-05-07 DIAGNOSIS — J329 Chronic sinusitis, unspecified: Secondary | ICD-10-CM | POA: Diagnosis not present

## 2020-05-07 DIAGNOSIS — J343 Hypertrophy of nasal turbinates: Secondary | ICD-10-CM | POA: Diagnosis not present

## 2020-05-07 DIAGNOSIS — G4733 Obstructive sleep apnea (adult) (pediatric): Secondary | ICD-10-CM | POA: Diagnosis not present

## 2020-05-12 ENCOUNTER — Ambulatory Visit: Payer: Medicare HMO | Admitting: Dietician

## 2020-05-12 DIAGNOSIS — M47817 Spondylosis without myelopathy or radiculopathy, lumbosacral region: Secondary | ICD-10-CM | POA: Diagnosis not present

## 2020-05-12 DIAGNOSIS — Z6836 Body mass index (BMI) 36.0-36.9, adult: Secondary | ICD-10-CM | POA: Diagnosis not present

## 2020-05-13 DIAGNOSIS — M961 Postlaminectomy syndrome, not elsewhere classified: Secondary | ICD-10-CM | POA: Diagnosis not present

## 2020-05-13 DIAGNOSIS — M1711 Unilateral primary osteoarthritis, right knee: Secondary | ICD-10-CM | POA: Diagnosis not present

## 2020-05-13 DIAGNOSIS — M175 Other unilateral secondary osteoarthritis of knee: Secondary | ICD-10-CM | POA: Diagnosis not present

## 2020-05-19 DIAGNOSIS — M961 Postlaminectomy syndrome, not elsewhere classified: Secondary | ICD-10-CM | POA: Diagnosis not present

## 2020-05-19 DIAGNOSIS — G894 Chronic pain syndrome: Secondary | ICD-10-CM | POA: Diagnosis not present

## 2020-05-19 DIAGNOSIS — M545 Low back pain, unspecified: Secondary | ICD-10-CM | POA: Diagnosis not present

## 2020-05-19 DIAGNOSIS — R2689 Other abnormalities of gait and mobility: Secondary | ICD-10-CM | POA: Diagnosis not present

## 2020-05-19 DIAGNOSIS — G9009 Other idiopathic peripheral autonomic neuropathy: Secondary | ICD-10-CM | POA: Diagnosis not present

## 2020-05-21 ENCOUNTER — Ambulatory Visit
Admission: RE | Admit: 2020-05-21 | Discharge: 2020-05-21 | Disposition: A | Discharge status: Home / Self Care | Payer: Medicare HMO | Source: Ambulatory Visit | Attending: Orthopaedic Surgery | Admitting: Orthopaedic Surgery

## 2020-05-21 DIAGNOSIS — M4802 Spinal stenosis, cervical region: Secondary | ICD-10-CM | POA: Diagnosis not present

## 2020-05-21 DIAGNOSIS — M25511 Pain in right shoulder: Secondary | ICD-10-CM

## 2020-05-21 DIAGNOSIS — M542 Cervicalgia: Secondary | ICD-10-CM

## 2020-05-23 ENCOUNTER — Encounter (HOSPITAL_COMMUNITY): Payer: Self-pay | Admitting: Emergency Medicine

## 2020-05-23 ENCOUNTER — Emergency Department (HOSPITAL_COMMUNITY)
Admission: EM | Admit: 2020-05-23 | Discharge: 2020-05-23 | Disposition: A | Discharge status: Home / Self Care | Payer: Medicare HMO | Attending: Emergency Medicine | Admitting: Emergency Medicine

## 2020-05-23 ENCOUNTER — Other Ambulatory Visit: Payer: Self-pay

## 2020-05-23 DIAGNOSIS — G8929 Other chronic pain: Secondary | ICD-10-CM | POA: Insufficient documentation

## 2020-05-23 DIAGNOSIS — Z7984 Long term (current) use of oral hypoglycemic drugs: Secondary | ICD-10-CM | POA: Insufficient documentation

## 2020-05-23 DIAGNOSIS — F10929 Alcohol use, unspecified with intoxication, unspecified: Secondary | ICD-10-CM | POA: Diagnosis not present

## 2020-05-23 DIAGNOSIS — I110 Hypertensive heart disease with heart failure: Secondary | ICD-10-CM | POA: Insufficient documentation

## 2020-05-23 DIAGNOSIS — Z7982 Long term (current) use of aspirin: Secondary | ICD-10-CM | POA: Diagnosis not present

## 2020-05-23 DIAGNOSIS — F17210 Nicotine dependence, cigarettes, uncomplicated: Secondary | ICD-10-CM | POA: Insufficient documentation

## 2020-05-23 DIAGNOSIS — M542 Cervicalgia: Secondary | ICD-10-CM | POA: Diagnosis not present

## 2020-05-23 DIAGNOSIS — I509 Heart failure, unspecified: Secondary | ICD-10-CM | POA: Diagnosis not present

## 2020-05-23 DIAGNOSIS — M545 Low back pain, unspecified: Secondary | ICD-10-CM | POA: Insufficient documentation

## 2020-05-23 DIAGNOSIS — M25561 Pain in right knee: Secondary | ICD-10-CM | POA: Insufficient documentation

## 2020-05-23 DIAGNOSIS — M549 Dorsalgia, unspecified: Secondary | ICD-10-CM | POA: Diagnosis not present

## 2020-05-23 DIAGNOSIS — E1140 Type 2 diabetes mellitus with diabetic neuropathy, unspecified: Secondary | ICD-10-CM | POA: Diagnosis not present

## 2020-05-23 DIAGNOSIS — I10 Essential (primary) hypertension: Secondary | ICD-10-CM | POA: Diagnosis not present

## 2020-05-23 DIAGNOSIS — Z79899 Other long term (current) drug therapy: Secondary | ICD-10-CM | POA: Diagnosis not present

## 2020-05-23 DIAGNOSIS — R0902 Hypoxemia: Secondary | ICD-10-CM | POA: Diagnosis not present
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
## 2020-06-16 DIAGNOSIS — G894 Chronic pain syndrome: Secondary | ICD-10-CM | POA: Diagnosis not present

## 2020-06-16 DIAGNOSIS — R2689 Other abnormalities of gait and mobility: Secondary | ICD-10-CM | POA: Diagnosis not present

## 2020-06-16 DIAGNOSIS — M545 Low back pain, unspecified: Secondary | ICD-10-CM | POA: Diagnosis not present

## 2020-06-16 DIAGNOSIS — M961 Postlaminectomy syndrome, not elsewhere classified: Secondary | ICD-10-CM | POA: Diagnosis not present

## 2020-06-16 DIAGNOSIS — G9009 Other idiopathic peripheral autonomic neuropathy: Secondary | ICD-10-CM | POA: Diagnosis not present

## 2020-06-25 ENCOUNTER — Other Ambulatory Visit: Payer: Self-pay | Admitting: Internal Medicine

## 2020-06-26 ENCOUNTER — Ambulatory Visit: Payer: Medicare HMO | Admitting: Cardiovascular Disease

## 2020-06-26 NOTE — Telephone Encounter (Signed)
Please refill as per office routine med refill policy (all routine meds refilled for 3 mo or monthly per pt preference up to one year from last visit, then month to month grace period for 3 mo, then further med refills will have to be denied)  

## 2020-07-03 ENCOUNTER — Encounter: Payer: Self-pay | Admitting: Internal Medicine

## 2020-07-03 ENCOUNTER — Telehealth (INDEPENDENT_AMBULATORY_CARE_PROVIDER_SITE_OTHER): Payer: Medicare HMO | Admitting: Internal Medicine

## 2020-07-03 DIAGNOSIS — K130 Diseases of lips: Secondary | ICD-10-CM

## 2020-07-03 DIAGNOSIS — E1169 Type 2 diabetes mellitus with other specified complication: Secondary | ICD-10-CM | POA: Diagnosis not present

## 2020-07-03 DIAGNOSIS — J0141 Acute recurrent pansinusitis: Secondary | ICD-10-CM

## 2020-07-03 DIAGNOSIS — J309 Allergic rhinitis, unspecified: Secondary | ICD-10-CM

## 2020-07-03 MED ORDER — FLUTICASONE PROPIONATE 50 MCG/ACT NA SUSP: 2.0000 | Freq: Every day | NASAL | 6 refills | Status: AC

## 2020-07-03 MED ORDER — AMLODIPINE BESYLATE 10 MG PO TABS: ORAL_TABLET | ORAL | 3 refills | Status: DC

## 2020-07-03 MED ORDER — AMOXICILLIN-POT CLAVULANATE 875-125 MG PO TABS: 1.0000 | ORAL_TABLET | Freq: Two times a day (BID) | ORAL | 0 refills | Status: DC

## 2020-07-03 MED ORDER — LORATADINE 10 MG PO TABS: 10.0000 mg | ORAL_TABLET | Freq: Every day | ORAL | 3 refills | Status: DC | PRN

## 2020-07-03 MED ORDER — CLOTRIMAZOLE-BETAMETHASONE 1-0.05 % EX CREA: TOPICAL_CREAM | CUTANEOUS | 1 refills | Status: DC

## 2020-07-03 MED ORDER — PREDNISONE 10 MG PO TABS: ORAL_TABLET | ORAL | 0 refills | Status: DC

## 2020-07-03 MED ORDER — ROSUVASTATIN CALCIUM 20 MG PO TABS: ORAL_TABLET | ORAL | 3 refills | Status: DC

## 2020-07-03 NOTE — Progress Notes (Signed)
Patient ID: Harold [name], male   DOB: 01/12/1966, 55 y.o.   MRN: [id number]  Virtual Visit via Video Note  I connected with [name] on 07/03/20 at  9:00 AM EDT by a video enabled telemedicine application and verified that I am speaking with the correct person using two identifiers.  Location of all participants today Patient: at home Provider: at office   I discussed the limitations of evaluation and management by telemedicine and the availability of in person appointments. The patient expressed understanding and agreed to proceed.  History of Present Illness:  Here with 2-3 days acute onset fever, facial pain, pressure, headache, general weakness and malaise, and greenish d/c, with mild ST and cough, but pt denies chest pain, wheezing, increased sob or doe, orthopnea, PND, increased LE swelling, palpitations, dizziness or syncope.   Does have several wks ongoing nasal allergy symptoms with clearish congestion, itch and sneezing, without fever, pain, ST, cough, swelling or wheezing.  Also has a lesion crack like to left angle of mouth for several days with a kind of whitish tissued buildup trying to heal it seems.   Past Medical History:  Diagnosis Date  . Acute idiopathic pericarditis 04/08/2010   Qualifier: Diagnosis of  By: [name], [name]    . Acute pericarditis    admx 03-26-10 thru 03-29-10; a. echo 03-26-10: EF 55-60%; mild LVH; trivial MR; RVF ok; mild -mod circumferential Eff w/o tamponade  . Acute sinusitis 06/04/2014  . Allergic rhinitis, cause unspecified 09/14/2012  . Allergy   . Anxiety   . CHF (congestive heart failure) ([place]) 06/29/2010   was pericardititis not CHF  . Chronic back pain   . Chronic low back pain 08/06/2019  . Closed head injury with concussion   . Constipation   . 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 DDD (degenerative disc disease), lumbar 09/14/2012  . Depression   . Diabetes mellitus without complication (HCC)    borderline   . Edema 06/10/2010    Qualifier: Diagnosis of  By: [name], MD, [name]    . Erectile dysfunction 09/14/2012  . Essential hypertension 03/11/2008   Qualifier: Diagnosis of  By: [name] MD, [name]    . GERD (gastroesophageal reflux disease)    ocassional  . Gout    pt denies  . Gout, unspecified 05/01/2007   Qualifier: Diagnosis of  By: [name] MD, [name]   . Headache    migraines in the past  . History of pancreatitis    a. admx 04-2009.Marland KitchenMarland Kitchen? 2-2 triglycerides  . Hyperlipidemia 07/11/2013  . Hypertension   . Hypertriglyceridemia    a. followed by [facility]  . HYPERTRIGLYCERIDEMIA 05/26/2009   Qualifier: Diagnosis of  By: [name] MD, [name]   . Left sided sciatica 09/14/2012  . Metabolic syndrome   . METABOLIC SYNDROME X 9/75/3005   Qualifier: Diagnosis of  By: [name]    . Neuropathy   . Nocturia 02/17/2015  . NONSPECIFIC ABNORMAL ELECTROCARDIOGRAM 04/08/2010   Qualifier: Diagnosis of  By: [name], RN, BSN, [name]    . Obesity   . OSA (obstructive sleep apnea)    CPAP- not current  . Restless legs   . Type 2 diabetes mellitus ([place]) 03/04/2015  . Type 2 diabetes mellitus with diabetic neuropathy, unspecified ([place]) 09/20/2019  . Urinary frequency 07/24/2015   Past Surgical History:  Procedure Laterality Date  . COLONOSCOPY    . KNEE SURGERY Right    x3 scopes  . KNEE SURGERY  Right    open menisicus repair  . LUMBAR LAMINECTOMY/DECOMPRESSION MICRODISCECTOMY N/A 08/26/2015   Procedure: Lumbar Two-Sacral One  Laminectomy for decompression;  Surgeon: [name], MD;  Location: [facility];  Service: Neurosurgery;  Laterality: N/A;  . pericardectomy  07/15/2010   Dr. [name]  . pleurx catheter placement  07/15/2010   Dr. [name]  . SHOULDER SURGERY     right and left shoulders- Rotator cuff repair  . TONSILLECTOMY AND ADENOIDECTOMY     one tonsil    reports that he has been smoking cigarettes. He has a 11.88 pack-year smoking history. He has never used smokeless tobacco. He  reports current alcohol use. He reports that he does not use drugs. family history includes Colon cancer (age of onset: 4) in his father; Diabetes in his father, mother, and sister; Heart disease (age of onset: 38) in his father; Hypertension in an other family member; Liver cancer in his paternal uncle; Pancreatitis in his father and paternal uncle; Stomach cancer in his maternal grandfather. Allergies  Allergen Reactions  . Other Itching, Swelling and Palpitations    Pecans: itching and swelling of the tongue   . Diphenhydramine Itching, Palpitations and Other (See Comments)    "jittery"  . Colchicine Diarrhea  . Lipitor [Atorvastatin] Other (See Comments)    Muscle cramps  . Septra [Sulfamethoxazole-Trimethoprim] Itching   Current Outpatient Medications on File Prior to Visit  Medication Sig Dispense Refill  . ammonium lactate (AMLACTIN) 12 % lotion Apply 1 application topically as needed for dry skin. (Patient not taking: Reported on 12/26/2019) 400 g 3  . ascorbic acid (VITAMIN C) 500 MG tablet Take 500 mg by mouth daily.    [name] aspirin EC 81 MG tablet Take 81 mg by mouth daily.    . Blood Glucose Monitoring Suppl (ONE TOUCH ULTRA 2) w/Device KIT Use as directed daily E11.9 1 each 0  . citalopram (CELEXA) 40 MG tablet TAKE ONE TABLET BY MOUTH DAILY (Patient taking differently: Take 40 mg by mouth daily.) 90 tablet 2  . diclofenac Sodium (VOLTAREN) 1 % GEL Apply 1 application topically 3 (three) times daily as needed (PAIN).     . fenofibrate (TRICOR) 145 MG tablet TAKE ONE TABLET BY MOUTH DAILY (Patient taking differently: Take 145 mg by mouth daily.) 90 tablet 2  . fentaNYL (DURAGESIC - DOSED MCG/HR) 50 MCG/HR Place 50 mcg onto the skin every 3 (three) days.    . fluconazole (DIFLUCAN) 100 MG tablet 1 tablet daily by mouth 7 tablet 0  . furosemide (LASIX) 40 MG tablet Take 1 tablet (40 mg total) by mouth daily. 90 tablet 3  . gabapentin (NEURONTIN) 300 MG capsule Take 900 mg by mouth 3  (three) times daily.    [name] glucose blood test strip Use as instructed once daily E119 100 each 12  . Lancets MISC Use as directed daily E11.9 100 each 11  . lisinopril (ZESTRIL) 40 MG tablet TAKE ONE TABLET BY MOUTH DAILY (Patient taking differently: Take 40 mg by mouth daily. ) 90 tablet 2  . metFORMIN (GLUCOPHAGE-XR) 500 MG 24 hr tablet TAKE TWO TABLETS BY MOUTH DAILY WITH BREAKFAST 180 tablet 3  . methocarbamol (ROBAXIN) 500 MG tablet Take 1 tablet (500 mg total) by mouth 4 (four) times daily. 45 tablet 0  . Multiple Minerals-Vitamins (CAL MAG ZINC +D3 PO) Take 1 tablet by mouth daily.    . Multiple Vitamin (MULTIVITAMIN WITH MINERALS) TABS tablet Take 1 tablet by mouth daily.    [name]  oxyCODONE-acetaminophen (PERCOCET) 10-325 MG tablet Take 1 tablet by mouth every 6 (six) hours as needed for pain. 30 tablet 0  . Potassium Chloride ER 20 MEQ TBCR TAKE ONE TABLET BY MOUTH DAILY (Patient taking differently: Take 20 mEq by mouth daily.) 90 tablet 2  . sildenafil (REVATIO) 20 MG tablet TAKE ONE TO FIVE TABLETS BY MOUTH DAILY AS NEEDED 100 tablet 3  . sildenafil (VIAGRA) 100 MG tablet Take 0.5-1 tablets (50-100 mg total) by mouth daily as needed for erectile dysfunction. 5 tablet 11  . terbinafine (LAMISIL) 250 MG tablet Take 1 tablet (250 mg total) by mouth daily. (Patient taking differently: Take 250 mg by mouth daily as needed. ) 90 tablet 1  . tiZANidine (ZANAFLEX) 4 MG tablet Take 4 mg by mouth 3 (three) times daily as needed for muscle spasms.  (Patient not taking: Reported on 04/09/2020)    . tolterodine (DETROL) 2 MG tablet TAKE 1 TABLET TWICE DAILY (Patient taking differently: Take 2 mg by mouth at bedtime. ) 180 tablet 1  . vitamin B-12 (CYANOCOBALAMIN) 1000 MCG tablet Take 1 tablet (1,000 mcg total) by mouth daily. 90 tablet 1   No current facility-administered medications on file prior to visit.    Observations/Objective: Alert, NAD, appropriate mood and affect, resps normal, cn 2-12 intact,  moves all 4s, no visible rash or swelling Lab Results  Component Value Date   WBC 19.0 (H) 01/09/2020   HGB 12.5 (L) 01/09/2020   HCT 38.0 (L) 01/09/2020   PLT 264 01/09/2020   GLUCOSE 112 (H) 01/06/2020   CHOL 149 08/07/2019   TRIG 188.0 (H) 08/07/2019   HDL 26.60 (L) 08/07/2019   LDLDIRECT 53.0 02/13/2018   LDLCALC 85 08/07/2019   ALT 16 08/07/2019   AST 17 08/07/2019   NA 138 01/06/2020   K 3.6 01/06/2020   CL 102 01/06/2020   CREATININE 0.74 01/06/2020   BUN 10 01/06/2020   CO2 24 01/06/2020   TSH 0.47 08/07/2019   PSA 13.95 (H) 08/07/2019   INR 1.72 (H) 07/05/2010   HGBA1C 6.7 (H) 01/06/2020   MICROALBUR 0.8 02/13/2018   Assessment and Plan: See notes  Follow Up Instructions: See notes   I discussed the assessment and treatment plan with the patient. The patient was provided an opportunity to ask questions and all were answered. The patient agreed with the plan and demonstrated an understanding of the instructions.   The patient was advised to call back or seek an in-person evaluation if the symptoms worsen or if the condition fails to improve as anticipated.   Cathlean Cower, MD

## 2020-07-04 ENCOUNTER — Encounter: Payer: Self-pay | Admitting: Internal Medicine

## 2020-07-04 ENCOUNTER — Other Ambulatory Visit: Payer: Self-pay | Admitting: Internal Medicine

## 2020-07-04 DIAGNOSIS — K130 Diseases of lips: Secondary | ICD-10-CM | POA: Insufficient documentation

## 2020-07-04 NOTE — Patient Instructions (Signed)
Please take all new medication as prescribed 

## 2020-07-04 NOTE — Assessment & Plan Note (Signed)
.  Mild to mod, for claritin restart, predpac asd, continue flonase,  to f/u any worsening symptoms or concerns

## 2020-07-04 NOTE — Assessment & Plan Note (Signed)
Left angular  - for topical lotrisone asd,  to f/u any worsening symptoms or concerns

## 2020-07-04 NOTE — Telephone Encounter (Signed)
Please refill as per office routine med refill policy (all routine meds refilled for 3 mo or monthly per pt preference up to one year from last visit, then month to month grace period for 3 mo, then further med refills will have to be denied)  

## 2020-07-04 NOTE — Assessment & Plan Note (Addendum)
Lab Results  Component Value Date   HGBA1C 6.7 (H) 01/06/2020   Stable, pt to continue current medical treatment metformin, declines f/u a1c for now

## 2020-07-04 NOTE — Assessment & Plan Note (Signed)
Mild to mod, for antibx course,  to f/u any worsening symptoms or concerns 

## 2020-07-08 ENCOUNTER — Telehealth: Payer: Self-pay | Admitting: Internal Medicine

## 2020-07-08 ENCOUNTER — Other Ambulatory Visit: Payer: Self-pay | Admitting: Internal Medicine

## 2020-07-08 DIAGNOSIS — Z0000 Encounter for general adult medical examination without abnormal findings: Secondary | ICD-10-CM

## 2020-07-08 DIAGNOSIS — E559 Vitamin D deficiency, unspecified: Secondary | ICD-10-CM

## 2020-07-08 DIAGNOSIS — E538 Deficiency of other specified B group vitamins: Secondary | ICD-10-CM

## 2020-07-08 DIAGNOSIS — E1169 Type 2 diabetes mellitus with other specified complication: Secondary | ICD-10-CM

## 2020-07-08 NOTE — Telephone Encounter (Signed)
Please refill as per office routine med refill policy (all routine meds refilled for 3 mo or monthly per pt preference up to one year from last visit, then month to month grace period for 3 mo, then further med refills will have to be denied)  

## 2020-07-08 NOTE — Telephone Encounter (Signed)
Ok labs ordered 

## 2020-07-08 NOTE — Telephone Encounter (Signed)
Patient calling, wondering if he can get his lab work done before his physical on 05.18.22

## 2020-07-08 NOTE — Telephone Encounter (Signed)
Patient notified that labs have be order

## 2020-07-16 ENCOUNTER — Other Ambulatory Visit: Payer: Self-pay | Admitting: Internal Medicine

## 2020-07-17 DIAGNOSIS — G9009 Other idiopathic peripheral autonomic neuropathy: Secondary | ICD-10-CM | POA: Diagnosis not present

## 2020-07-17 DIAGNOSIS — M961 Postlaminectomy syndrome, not elsewhere classified: Secondary | ICD-10-CM | POA: Diagnosis not present

## 2020-07-17 DIAGNOSIS — M545 Low back pain, unspecified: Secondary | ICD-10-CM | POA: Diagnosis not present

## 2020-07-17 DIAGNOSIS — R2689 Other abnormalities of gait and mobility: Secondary | ICD-10-CM | POA: Diagnosis not present

## 2020-07-17 DIAGNOSIS — G894 Chronic pain syndrome: Secondary | ICD-10-CM | POA: Diagnosis not present

## 2020-07-18 ENCOUNTER — Encounter: Payer: Self-pay | Admitting: Cardiology

## 2020-07-18 NOTE — Progress Notes (Deleted)
Cardiology Office Note   Date:  07/18/2020   ID:  Harold [name], DOB [birth year], MRN [id number]  PCP:  Biagio Borg, MD  Cardiologist:   Minus Breeding, MD   No chief complaint on file.     History of Present Illness: Harold [name] is a 55 y.o. male  who presents for evaluation of multiple cardiovascular risk factors.  I saw him in the past for pericarditis.    In 2017 he had a POET (Plain Old Exercise Treadmill) after the last visit.  He had no ischemia but did have a hypertensive response.     Since I last saw him he had lumbar spinal fusion.  ***  ***   has done relatively well.  His fiance died from an aneurysm.  He has had chronic back pain and walks with a stick.  He is also had an ulcer on his foot because he stubbed his toe.  He is getting that treated.  He has not had any new shortness of breath, PND or orthopnea.  Had no new palpitations, presyncope or syncope.  He is limited in his activities.  He is not having any chest pressure, neck or arm discomfort.  He has lost some weight.  He has had no new swelling.  Past Medical History:  Diagnosis Date  . Acute idiopathic pericarditis 04/08/2010   Qualifier: Diagnosis of  By: [name], [name]    . Acute sinusitis 06/04/2014  . Anxiety   . CHF (congestive heart failure) ([place]) 06/29/2010   was pericardititis not CHF  . Chronic back pain   . Constipation   . DDD (degenerative disc disease), lumbar 09/14/2012  . Depression   . Erectile dysfunction 09/14/2012  . GERD (gastroesophageal reflux disease)    ocassional  . Gout, unspecified 05/01/2007   Qualifier: Diagnosis of  By: [name] MD, [name]   . Headache    migraines in the past  . History of pancreatitis    a. admx 04-2009.Marland KitchenMarland Kitchen? 2-2 triglycerides  . Hypertension   . Hypertriglyceridemia    a. followed by [facility]  . Left sided sciatica 09/14/2012  . Neuropathy   . Obesity   . OSA (obstructive sleep apnea)    CPAP- not current  . Restless legs   . Type 2  diabetes mellitus with diabetic neuropathy, unspecified ([place]) 09/20/2019    Past Surgical History:  Procedure Laterality Date  . COLONOSCOPY    . KNEE SURGERY Right    x3 scopes  . KNEE SURGERY Right    open menisicus repair  . LUMBAR LAMINECTOMY/DECOMPRESSION MICRODISCECTOMY N/A 08/26/2015   Procedure: Lumbar Two-Sacral One  Laminectomy for decompression;  Surgeon: [name], MD;  Location: [facility];  Service: Neurosurgery;  Laterality: N/A;  . pericardectomy  07/15/2010   Dr. [name]  . pleurx catheter placement  07/15/2010   Dr. [name]  . SHOULDER SURGERY     right and left shoulders- Rotator cuff repair  . TONSILLECTOMY AND ADENOIDECTOMY     one tonsil     Current Outpatient Medications  Medication Sig Dispense Refill  . amLODipine (NORVASC) 10 MG tablet TAKE 1 TABLET(10 MG) BY MOUTH DAILY 30 tablet 0  . ammonium lactate (AMLACTIN) 12 % lotion Apply 1 application topically as needed for dry skin. (Patient not taking: Reported on 12/26/2019) 400 g 3  . amoxicillin-clavulanate (AUGMENTIN) 875-125 MG tablet Take 1 tablet by mouth 2 (two) times daily. 20 tablet 0  . ascorbic  acid (VITAMIN C) 500 MG tablet Take 500 mg by mouth daily.    [name] aspirin EC 81 MG tablet Take 81 mg by mouth daily.    . Blood Glucose Monitoring Suppl (ONE TOUCH ULTRA 2) w/Device KIT Use as directed daily E11.9 1 each 0  . citalopram (CELEXA) 40 MG tablet TAKE ONE TABLET BY MOUTH DAILY (Patient taking differently: Take 40 mg by mouth daily.) 90 tablet 2  . clotrimazole-betamethasone (LOTRISONE) cream Use as directed twice daily as needed 15 g 1  . diclofenac Sodium (VOLTAREN) 1 % GEL Apply 1 application topically 3 (three) times daily as needed (PAIN).     . fenofibrate (TRICOR) 145 MG tablet TAKE ONE TABLET BY MOUTH DAILY (Patient taking differently: Take 145 mg by mouth daily.) 90 tablet 2  . fentaNYL (DURAGESIC - DOSED MCG/HR) 50 MCG/HR Place 50 mcg onto the skin every 3 (three) days.    .  fluconazole (DIFLUCAN) 100 MG tablet 1 tablet daily by mouth 7 tablet 0  . fluticasone (FLONASE) 50 MCG/ACT nasal spray Place 2 sprays into both nostrils daily. 16 g 6  . furosemide (LASIX) 40 MG tablet Take 1 tablet (40 mg total) by mouth daily. 90 tablet 3  . gabapentin (NEURONTIN) 300 MG capsule Take 900 mg by mouth 3 (three) times daily.    [name] glucose blood test strip Use as instructed once daily E119 100 each 12  . Lancets MISC Use as directed daily E11.9 100 each 11  . lisinopril (ZESTRIL) 40 MG tablet TAKE ONE TABLET BY MOUTH DAILY (Patient taking differently: Take 40 mg by mouth daily. ) 90 tablet 2  . loratadine (CLARITIN) 10 MG tablet Take 1 tablet (10 mg total) by mouth daily as needed for allergies. 90 tablet 3  . metFORMIN (GLUCOPHAGE-XR) 500 MG 24 hr tablet TAKE TWO TABLETS BY MOUTH DAILY WITH BREAKFAST 180 tablet 3  . methocarbamol (ROBAXIN) 500 MG tablet Take 1 tablet (500 mg total) by mouth 4 (four) times daily. 45 tablet 0  . Multiple Minerals-Vitamins (CAL MAG ZINC +D3 PO) Take 1 tablet by mouth daily.    . Multiple Vitamin (MULTIVITAMIN WITH MINERALS) TABS tablet Take 1 tablet by mouth daily.    [name] oxyCODONE-acetaminophen (PERCOCET) 10-325 MG tablet Take 1 tablet by mouth every 6 (six) hours as needed for pain. 30 tablet 0  . Potassium Chloride ER 20 MEQ TBCR TAKE ONE TABLET BY MOUTH DAILY (Patient taking differently: Take 20 mEq by mouth daily.) 90 tablet 2  . predniSONE (DELTASONE) 10 MG tablet 3 tabs by mouth per day for 3 days,2tabs per day for 3 days,1tab per day for 3 days 18 tablet 0  . rosuvastatin (CRESTOR) 20 MG tablet TAKE 1 TABLET(20 MG) BY MOUTH DAILY 30 tablet 0  . sildenafil (REVATIO) 20 MG tablet TAKE ONE TO FIVE TABLETS BY MOUTH DAILY AS NEEDED 100 tablet 3  . sildenafil (VIAGRA) 100 MG tablet Take 0.5-1 tablets (50-100 mg total) by mouth daily as needed for erectile dysfunction. 5 tablet 11  . terbinafine (LAMISIL) 250 MG tablet Take 1 tablet (250 mg total) by  mouth daily. (Patient taking differently: Take 250 mg by mouth daily as needed. ) 90 tablet 1  . tiZANidine (ZANAFLEX) 4 MG tablet Take 4 mg by mouth 3 (three) times daily as needed for muscle spasms.  (Patient not taking: Reported on 04/09/2020)    . tolterodine (DETROL) 2 MG tablet TAKE 1 TABLET TWICE DAILY (Patient taking differently: Take 2 mg by  mouth at bedtime. ) 180 tablet 1  . vitamin B-12 (CYANOCOBALAMIN) 1000 MCG tablet Take 1 tablet (1,000 mcg total) by mouth daily. 90 tablet 1   No current facility-administered medications for this visit.    Allergies:   Other, Diphenhydramine, Colchicine, Lipitor [atorvastatin], and Septra [sulfamethoxazole-trimethoprim]    ROS:  Please see the history of present illness.   Otherwise, review of systems are positive for ***.   All other systems are reviewed and negative.    PHYSICAL EXAM: VS:  There were no vitals taken for this visit. , BMI There is no height or weight on file to calculate BMI. GENERAL:  Well appearing NECK:  No jugular venous distention, waveform within normal limits, carotid upstroke brisk and symmetric, no bruits, no thyromegaly LUNGS:  Clear to auscultation bilaterally CHEST:  Unremarkable HEART:  PMI not displaced or sustained,S1 and S2 within normal limits, no S3, no S4, no clicks, no rubs, *** murmurs ABD:  Flat, positive bowel sounds normal in frequency in pitch, no bruits, no rebound, no guarding, no midline pulsatile mass, no hepatomegaly, no splenomegaly EXT:  2 plus pulses throughout, no edema, no cyanosis no clubbing    ***GENERAL:  Well appearing NECK:  No jugular venous distention, waveform within normal limits, carotid upstroke brisk and symmetric, no bruits, no thyromegaly LUNGS:  Clear to auscultation bilaterally CHEST:  Unremarkable HEART:  PMI not displaced or sustained,S1 and S2 within normal limits, no S3, no S4, no clicks, no rubs, no murmurs ABD:  Flat, positive bowel sounds normal in frequency in  pitch, no bruits, no rebound, no guarding, no midline pulsatile mass, no hepatomegaly, no splenomegaly EXT:  2 plus pulses throughout, no edema, no cyanosis no clubbing   EKG:  EKG is *** ordered today. The ekg ordered today demonstrates sinus rhythm, rate ***, axis within normal limits, QTC prolonged, poor anterior R wave progression, no acute ST-T wave changes.   Recent Labs: 08/07/2019: ALT 16; TSH 0.47 01/06/2020: BUN 10; Creatinine, Ser 0.74; Potassium 3.6; Sodium 138 01/09/2020: Hemoglobin 12.5; Platelets 264    Lipid Panel    Component Value Date/Time   CHOL 149 08/07/2019 1421   TRIG 188.0 (H) 08/07/2019 1421   HDL 26.60 (L) 08/07/2019 1421   CHOLHDL 6 08/07/2019 1421   VLDL 37.6 08/07/2019 1421   LDLCALC 85 08/07/2019 1421   LDLDIRECT 53.0 02/13/2018 1641      Wt Readings from Last 3 Encounters:  04/09/20 260 lb 8 oz (118.2 kg)  01/08/20 260 lb 12.9 oz (118.3 kg)  01/06/20 260 lb 14.4 oz (118.3 kg)      Other studies Reviewed: Additional studies/ records that were reviewed today include: *** Review of the above records demonstrates:  Please see elsewhere in the note.     ASSESSMENT AND PLAN:   OBESITY:***  He has lost 14 pounds through diet and exercise.  I am proud of him encouraged more the same.  DM: ***   I do not see a recent A1c so I will check this.   TOBACCO ABUSE: *** He did quit smoking for a while but started again with Covid but he says he will try again to quit.  I will bring him back and check a lipid profile.  HTN:     BP is *** mildly elevated today.  I am going to continue the meds as listed and treat his sleep apnea.   QT PROLONGED:    ***  He has had no symptoms related to  this.  This is a new finding.  I will check a magnesium with his electrolytes.  He should avoid QT prolonging drugs.  SLEEP APNEA: He is being managed by Dr. Claiborne Billings for sleep apnea.  ***  needs to have repeat sleep study.  He did get a new machine last year  but the sleep study was canceled for he did have follow-up because of Covid.  I will get a get him an appointment with Dr. Claiborne Billings.   PERICARDIAL EFFUSION:    ***  He had this treated years ago.  I would not suspect recurrence.   Current medicines are reviewed at length with the patient today.  The patient does not have concerns regarding medicines.  The following changes have been made:  ***  Labs/ tests ordered today include: ***  No orders of the defined types were placed in this encounter.    Disposition:   FU with me in ***.     Signed, Minus Breeding, MD  07/18/2020 8:10 PM    [facility]

## 2020-07-20 ENCOUNTER — Ambulatory Visit: Payer: Medicare HMO | Admitting: Cardiology

## 2020-07-20 DIAGNOSIS — I10 Essential (primary) hypertension: Secondary | ICD-10-CM

## 2020-07-20 DIAGNOSIS — G4730 Sleep apnea, unspecified: Secondary | ICD-10-CM

## 2020-07-27 ENCOUNTER — Encounter: Payer: Self-pay | Admitting: Cardiovascular Disease

## 2020-07-27 ENCOUNTER — Ambulatory Visit: Payer: Medicare HMO | Admitting: Cardiovascular Disease

## 2020-07-27 ENCOUNTER — Other Ambulatory Visit: Payer: Self-pay

## 2020-07-27 VITALS — BP 142/78 | HR 67 | Ht 71.0 in | Wt 263.4 lb

## 2020-07-27 DIAGNOSIS — G4733 Obstructive sleep apnea (adult) (pediatric): Secondary | ICD-10-CM

## 2020-07-27 DIAGNOSIS — E782 Mixed hyperlipidemia: Secondary | ICD-10-CM

## 2020-07-27 DIAGNOSIS — I10 Essential (primary) hypertension: Secondary | ICD-10-CM

## 2020-07-27 DIAGNOSIS — Z6836 Body mass index (BMI) 36.0-36.9, adult: Secondary | ICD-10-CM

## 2020-07-27 DIAGNOSIS — E118 Type 2 diabetes mellitus with unspecified complications: Secondary | ICD-10-CM

## 2020-07-27 NOTE — Progress Notes (Signed)
Cardiology Office Note    Date:  08/03/2020   ID:  Harold [name], DOB [birth year], MRN [id number]  PCP:  Biagio Borg, MD  Cardiologist:  Shelva Majestic, MD   F/U sleep evaluation.  History of Present Illness:  Harold [name] is a 55 y.o. male presents for f/u sleep evaluation  Harold [name] has a remote history of pericarditis as well as a history of diabetes mellitus, obesity, hypertension and hyperlipidemia.  Most recently he has been on a hypertensive regimen consisting of amlodipine 10 mg, furosemide 40 mg, lisinopril 40 mg daily.  He is on rosuvastatin 20 mg and fenofibrate for hyperlipidemia.  He is diabetic on metformin.  Only, in 2012 he had seen [name].  Prior to that evaluation he had been in the hospital earlier in the year for pericarditis and had been noted to have significant apnea at the time of his cardiac catheterization.  He was ultimately discharged on BiPAP until he can have a formal sleep study.  Apparently this was done and showed severe obstructive sleep apnea with an AHI of 78/h.  Apnea was complex with both central and obstructive events.  When he saw Dr. Gwenette Greet in May 2012 he was using BiPAP every night and had no issues with mask pressure.  He was sleeping better.    Harold [name] has not seen a sleep physician since.  Apparently he had stopped using therapy over the years.  He was referred for a sleep study in 2019 which again confirmed severe sleep apnea with an AHI of 69.6/h and significant oxygen desaturation to 77% during sleep.  Of note, he had extremely severe sleep apnea in the supine position with an AHI of 142.9/h.  There was moderate snoring.  Apparently, he had never had CPAP type duration following his diagnostic study and was never seen for sleep evaluation due to restrictions from the Covid pandemic.  He had recently seen Dr. [name] in April 2021 and at that time it was recommended he see me for evaluation.  I saw him for initial evaluation with  me in July 2021. several times per night.  He cannot sleep on his back.  An Epworth Sleepiness Scale score was calculated in the office today and this endorsed at 9.  He was unaware of any restless legs, hypnagogic hallucinations or cataplectic events.  During that evaluation, I had an extensive discussion with him reviewed his sleep study from his 2019 evaluation.  I discussed the effects of sleep apnea on normal sleep architecture and at length discussed its effects on cardiovascular health including hypertension, nocturnal arrhythmias, potential nocturnal ischemia due to hypoxemia in addition to its effects on glucose metabolism, GERD, as well as inflammation.  He had been on BiPAP therapy and I recommended he undergo a reevaluation with a BiPAP titration.  Since I saw him, apparently he never had his study done.  He had undergone back surgery in October by Dr. Saintclair Halsted.  His previous DME company was [facility] [employer] which is bought by [employer].  Presently, he does not sleep well.  His wife works the second shift and typically does not get home until 1 AM.  As result he often stays up and may go to bed between 3 and 4 AM.  He often wakes up between 11 AM and 1 PM or noon.  He is disabled.  He continues to experience nocturia 3-4 times while sleeping.  He presents for evaluation.  Past Medical History:  Diagnosis Date  . Acute idiopathic pericarditis 04/08/2010   Qualifier: Diagnosis of  By: [name], [name]    . Acute sinusitis 06/04/2014  . Anxiety   . CHF (congestive heart failure) ([place]) 06/29/2010   was pericardititis not CHF  . Chronic back pain   . Constipation   . DDD (degenerative disc disease), lumbar 09/14/2012  . Depression   . Erectile dysfunction 09/14/2012  . GERD (gastroesophageal reflux disease)    ocassional  . Gout, unspecified 05/01/2007   Qualifier: Diagnosis of  By: [name] MD, [name]   . Headache    migraines in the past  . History of pancreatitis    a. admx 04-2009.Marland KitchenMarland Kitchen? 2-2  triglycerides  . Hypertension   . Hypertriglyceridemia    a. followed by [facility]  . Left sided sciatica 09/14/2012  . Neuropathy   . Obesity   . OSA (obstructive sleep apnea)    CPAP- not current  . Restless legs   . Type 2 diabetes mellitus with diabetic neuropathy, unspecified ([place]) 09/20/2019    Past Surgical History:  Procedure Laterality Date  . COLONOSCOPY    . KNEE SURGERY Right    x3 scopes  . KNEE SURGERY Right    open menisicus repair  . LUMBAR LAMINECTOMY/DECOMPRESSION MICRODISCECTOMY N/A 08/26/2015   Procedure: Lumbar Two-Sacral One  Laminectomy for decompression;  Surgeon: [name], MD;  Location: [facility];  Service: Neurosurgery;  Laterality: N/A;  . pericardectomy  07/15/2010   Dr. [name]  . pleurx catheter placement  07/15/2010   Dr. [name]  . SHOULDER SURGERY     right and left shoulders- Rotator cuff repair  . TONSILLECTOMY AND ADENOIDECTOMY     one tonsil    Current Medications: Outpatient Medications Prior to Visit  Medication Sig Dispense Refill  . amLODipine (NORVASC) 10 MG tablet TAKE 1 TABLET(10 MG) BY MOUTH DAILY 30 tablet 0  . ammonium lactate (AMLACTIN) 12 % lotion Apply 1 application topically as needed for dry skin. 400 g 3  . amoxicillin-clavulanate (AUGMENTIN) 875-125 MG tablet Take 1 tablet by mouth 2 (two) times daily. 20 tablet 0  . ascorbic acid (VITAMIN C) 500 MG tablet Take 500 mg by mouth daily.    [name] aspirin EC 81 MG tablet Take 81 mg by mouth daily.    . Blood Glucose Monitoring Suppl (ONE TOUCH ULTRA 2) w/Device KIT Use as directed daily E11.9 1 each 0  . citalopram (CELEXA) 40 MG tablet TAKE ONE TABLET BY MOUTH DAILY (Patient taking differently: Take 40 mg by mouth daily.) 90 tablet 2  . clotrimazole-betamethasone (LOTRISONE) cream Use as directed twice daily as needed 15 g 1  . diclofenac Sodium (VOLTAREN) 1 % GEL Apply 1 application topically 3 (three) times daily as needed (PAIN).     . fenofibrate (TRICOR)  145 MG tablet TAKE ONE TABLET BY MOUTH DAILY (Patient taking differently: Take 145 mg by mouth daily.) 90 tablet 2  . fentaNYL (DURAGESIC - DOSED MCG/HR) 50 MCG/HR Place 50 mcg onto the skin every 3 (three) days.    . fluconazole (DIFLUCAN) 100 MG tablet 1 tablet daily by mouth 7 tablet 0  . fluticasone (FLONASE) 50 MCG/ACT nasal spray Place 2 sprays into both nostrils daily. 16 g 6  . furosemide (LASIX) 40 MG tablet Take 1 tablet (40 mg total) by mouth daily. 90 tablet 3  . gabapentin (NEURONTIN) 300 MG capsule Take 900 mg by mouth 3 (three) times daily.    [name]  glucose blood test strip Use as instructed once daily E119 100 each 12  . Lancets MISC Use as directed daily E11.9 100 each 11  . lisinopril (ZESTRIL) 40 MG tablet TAKE ONE TABLET BY MOUTH DAILY (Patient taking differently: Take 40 mg by mouth daily.) 90 tablet 2  . loratadine (CLARITIN) 10 MG tablet Take 1 tablet (10 mg total) by mouth daily as needed for allergies. 90 tablet 3  . metFORMIN (GLUCOPHAGE-XR) 500 MG 24 hr tablet TAKE TWO TABLETS BY MOUTH DAILY WITH BREAKFAST 180 tablet 3  . methocarbamol (ROBAXIN) 500 MG tablet Take 1 tablet (500 mg total) by mouth 4 (four) times daily. 45 tablet 0  . Multiple Minerals-Vitamins (CAL MAG ZINC +D3 PO) Take 1 tablet by mouth daily.    . Multiple Vitamin (MULTIVITAMIN WITH MINERALS) TABS tablet Take 1 tablet by mouth daily.    [name] oxyCODONE-acetaminophen (PERCOCET) 10-325 MG tablet Take 1 tablet by mouth every 6 (six) hours as needed for pain. 30 tablet 0  . Potassium Chloride ER 20 MEQ TBCR TAKE ONE TABLET BY MOUTH DAILY (Patient taking differently: Take 20 mEq by mouth daily.) 90 tablet 2  . predniSONE (DELTASONE) 10 MG tablet 3 tabs by mouth per day for 3 days,2tabs per day for 3 days,1tab per day for 3 days 18 tablet 0  . rosuvastatin (CRESTOR) 20 MG tablet TAKE 1 TABLET(20 MG) BY MOUTH DAILY 30 tablet 0  . sildenafil (REVATIO) 20 MG tablet TAKE ONE TO FIVE TABLETS BY MOUTH DAILY AS NEEDED 100  tablet 3  . sildenafil (VIAGRA) 100 MG tablet Take 0.5-1 tablets (50-100 mg total) by mouth daily as needed for erectile dysfunction. 5 tablet 11  . terbinafine (LAMISIL) 250 MG tablet Take 1 tablet (250 mg total) by mouth daily. (Patient taking differently: Take 250 mg by mouth daily as needed.) 90 tablet 1  . tiZANidine (ZANAFLEX) 4 MG tablet Take 4 mg by mouth 3 (three) times daily as needed for muscle spasms.    [name] tolterodine (DETROL) 2 MG tablet TAKE 1 TABLET TWICE DAILY (Patient taking differently: Take 2 mg by mouth at bedtime.) 180 tablet 1  . vitamin B-12 (CYANOCOBALAMIN) 1000 MCG tablet Take 1 tablet (1,000 mcg total) by mouth daily. 90 tablet 1   No facility-administered medications prior to visit.     Allergies:   Other, Diphenhydramine, Colchicine, Lipitor [atorvastatin], and Septra [sulfamethoxazole-trimethoprim]   Social History   Socioeconomic History  . Marital status: Divorced    Spouse name: Not on file  . Number of children: 1  . Years of education: Not on file  . Highest education level: Not on file  Occupational History  . Occupation: disabled    Fish farm manager: UNEMPLOYED  Tobacco Use  . Smoking status: Current Every Day Smoker    Packs/day: 0.33    Years: 36.00    Pack years: 11.88    Types: Cigarettes  . Smokeless tobacco: Never Used  . Tobacco comment: cigar - 1 a week  Vaping Use  . Vaping Use: Former  Substance and Sexual Activity  . Alcohol use: Yes    Comment: Maybe 2 times a month - 6 ounces each time  . Drug use: No  . Sexual activity: Not on file  Other Topics Concern  . Not on file  Social History Narrative   Regular exercise- No   Rare caffeine use    Social Determinants of Health   Financial Resource Strain: Not on file  Food Insecurity: Not on file  Transportation Needs: Not on file  Physical Activity: Not on file  Stress: Not on file  Social Connections: Not on file     Family History:  The patient's family history includes Colon  cancer (age of onset: 44) in his father; Diabetes in his father, mother, and sister; Heart disease (age of onset: 79) in his father; Hypertension in an other family member; Liver cancer in his paternal uncle; Pancreatitis in his father and paternal uncle; Stomach cancer in his maternal grandfather.   ROS General: Negative; No fevers, chills, or night sweats;  HEENT: Negative; No changes in vision or hearing, sinus congestion, difficulty swallowing Pulmonary: Negative; No cough, wheezing, shortness of breath, hemoptysis Cardiovascular: Negative; No chest pain, presyncope, syncope, palpitations GI: Negative; No nausea, vomiting, diarrhea, or abdominal pain GU: Negative; No dysuria, hematuria, or difficulty voiding Musculoskeletal: Negative; no myalgias, joint pain, or weakness Hematologic/Oncology: Negative; no easy bruising, bleeding Endocrine: Negative; no heat/cold intolerance; no diabetes Neuro: Negative; no changes in balance, headaches Skin: Negative; No rashes or skin lesions Psychiatric: Negative; No behavioral problems, depression Sleep: Positive for severe OSA with previous complex sleep apnea noted over 10 years ago no bruxism, restless legs, hypnogognic hallucinations, no cataplexy Other comprehensive 14 point system review is negative.   PHYSICAL EXAM:   VS:  BP (!) 142/78   Pulse 67   Ht _0  (1.803 m)   Wt 263 lb 6.4 oz (119.5 kg)   BMI 36.74 kg/m     Repeat blood pressure by me was 140/78  Wt Readings from Last 3 Encounters:  07/27/20 263 lb 6.4 oz (119.5 kg)  04/09/20 260 lb 8 oz (118.2 kg)  01/08/20 260 lb 12.9 oz (118.3 kg)       Physical Exam BP (!) 142/78   Pulse 67   Ht _1  (1.803 m)   Wt 263 lb 6.4 oz (119.5 kg)   BMI 36.74 kg/m  General: Alert, oriented, no distress.  Skin: normal turgor, no rashes, warm and dry HEENT: Normocephalic, atraumatic. Pupils equal round and reactive to light; sclera anicteric; extraocular muscles intact; Nose  without nasal septal hypertrophy Mouth/Parynx benign; Mallinpatti scale 4 Neck: Very thick neck, size 19 and three-quarter circumference no JVD, no carotid bruits; normal carotid upstroke Lungs: clear to ausculatation and percussion; no wheezing or rales Chest wall: without tenderness to palpitation Heart: PMI not displaced, RRR, s1 s2 normal, 1/6 systolic murmur, no diastolic murmur, no rubs, gallops, thrills, or heaves Abdomen: Central adiposity; soft, nontender; no hepatosplenomehaly, BS+; abdominal aorta nontender and not dilated by palpation. Back: no CVA tenderness Pulses 2+ Musculoskeletal: full range of motion, normal strength, no joint deformities Extremities: no clubbing cyanosis or edema, Homan's sign negative  Neurologic: grossly nonfocal; Cranial nerves grossly wnl Psychologic: Normal mood and affect   Studies/Labs Reviewed:   ECG (independently read by me): Sinus rhythm at 67; PACs; prolonged QTc at 509 msec  EKG:   Personally reviewed his ECG from July 19, 2019 which shows normal sinus rhythm at 72 bpm with nonspecific ST changes and prolonged QTc interval.  Recent Labs: BMP Latest Ref Rng & Units 01/06/2020 08/07/2019 02/13/2018  Glucose 70 - 99 mg/dL 112(H) 141(H) 159(H)  BUN 6 - 20 mg/dL _2 Creatinine 0.61 - 1.24 mg/dL 0.74 0.78 0.96  Sodium 135 - 145 mmol/L 138 138 135  Potassium 3.5 - 5.1 mmol/L 3.6 3.2(L) 4.2  Chloride 98 - 111 mmol/L 102 97 99  CO2 22 - 32 mmol/L 24 29  26  Calcium 8.9 - 10.3 mg/dL 9.3 9.1 9.7     Hepatic Function Latest Ref Rng & Units 08/07/2019 02/13/2018 06/17/2016  Total Protein 6.0 - 8.3 g/dL 6.9 7.1 6.6  Albumin 3.5 - 5.2 g/dL 4.1 4.3 4.2  AST 0 - 37 U/L _0 ALT 0 - 53 U/L _1 Alk Phosphatase 39 - 117 U/L 45 93 88  Total Bilirubin 0.2 - 1.2 mg/dL 0.3 0.4 0.3  Bilirubin, Direct 0.0 - 0.3 mg/dL 0.1 0.1 0.1    CBC Latest Ref Rng & Units 01/09/2020 01/08/2020 01/06/2020  WBC 4.0 - 10.5 K/uL 19.0(H) - 11.1(H)   Hemoglobin 13.0 - 17.0 g/dL 12.5(L) 12.9(L) 15.0  Hematocrit 39.0 - 52.0 % 38.0(L) 41.0 46.2  Platelets 150 - 400 K/uL 264 - 369   Lab Results  Component Value Date   MCV 99.5 01/09/2020   MCV 98.9 01/06/2020   MCV 96.6 08/07/2019   Lab Results  Component Value Date   TSH 0.47 08/07/2019   Lab Results  Component Value Date   HGBA1C 6.7 (H) 01/06/2020     BNP No results found for: BNP  ProBNP    Component Value Date/Time   PROBNP 443.0 (H) 07/02/2010 1626     Lipid Panel     Component Value Date/Time   CHOL 149 08/07/2019 1421   TRIG 188.0 (H) 08/07/2019 1421   HDL 26.60 (L) 08/07/2019 1421   CHOLHDL 6 08/07/2019 1421   VLDL 37.6 08/07/2019 1421   LDLCALC 85 08/07/2019 1421   LDLDIRECT 53.0 02/13/2018 1641     RADIOLOGY: No results found.   Additional studies/ records that were reviewed today include:   I reviewed the previous records from [name] in 2012, as well as the patient's recent sleep study from November 17, 2017.  ASSESSMENT:    1. OSA (obstructive sleep apnea)   2. Class 2 severe obesity due to excess calories with serious comorbidity and body mass index (BMI) of 36.0 to 36.9 in adult ([place])   3. Essential hypertension   4. Type 2 diabetes mellitus with complication, without long-term current use of insulin ([place])   5. Mixed hyperlipidemia     PLAN:  Mr. Harold "Harold Joiner" [name] is a 55 year-old African-American gentleman who has a history of obesity, remote history of pericarditis, hypertension, hyperlipidemia, as well as diabetes mellitus.  Apparently, over 10 years ago he had a sleep study and was told of having severe complex sleep apnea and initially had seen [name] in follow-up.  Apparently he was using a BiPAP machine at that time.  He ultimately was referred for a new sleep study in 2019 which again confirmed severe sleep apnea with an AHI of 69.6/h and events were significantly worse with supine posture with an AHI of  142.9/h.  He had severe oxygen desaturation to a nadir of 77% and time spent with O2 less than 88% was 68.3 minutes.,  Due to the [place] pandemic he never had follow-up titration study nor a follow-up sleep evaluation.  Issues with hypertension control and is on a multiple drug regimen consisting of amlodipine, furosemide, and lisinopril.  When I saw him for my initial evaluation with me in July 2021 spent considerable time with him educating him about sleep apnea and its adverse effects on his cardiovascular health.  Time I recommended a BiPAP titration study.  Apparently this was never done.  He underwent back surgery in October 2021 by Dr.  Saintclair Halsted.  He continues to sleep poorly.  He snores.  He has nocturia 3-4 times per night.  His sleep hygiene is very poor and that he often goes to bed between 3 and 4 AM and may wake up between 11 AM and 1 PM.  I have recommended he undergo a split-night with BiPAP titration if he not undergo a BiPAP titration alone.  We will try to expedite scheduling this study.  He sees Dr. Cathlean Cower for his primary care sees Dr. [name] for his cardiology care.  He continues to be on amlodipine 10 mg, lisinopril 40 mg, for hypertension and has a prescription for furosemide which she has not been taking.  He has mixed hyperlipidemia on rosuvastatin and fenofibrate.  He is diabetic on metformin.  I will see him back in the office within 90 days of his titration study and further recommendations will be made at that time.  Medication Adjustments/Labs and Tests Ordered: Current medicines are reviewed at length with the patient today.  Concerns regarding medicines are outlined above.  Medication changes, Labs and Tests ordered today are listed in the Patient Instructions below. Patient Instructions  Medication Instructions:  NO CHANGES *If you need a refill on your cardiac medications before your next appointment, please call your pharmacy*   Lab Work: NOT  NEEDED     Testing/Procedures: Your physician has recommended that you have a sleep study  SPLIT STUDY WITH BI-PAP. This test records several body functions during sleep, including: brain activity, eye movement, oxygen and carbon dioxide blood levels, heart rate and rhythm, breathing rate and rhythm, the flow of air through your mouth and nose, snoring, body muscle movements, and chest and belly movement.    Follow-Up: At [facility], you and your health needs are our priority.  As part of our continuing mission to provide you with exceptional heart care, we have created designated Provider Care Teams.  These Care Teams include your primary Cardiologist (physician) and Advanced Practice Providers (APPs -  Physician Assistants and Nurse Practitioners) who all work together to provide you with the care you need, when you need it.     Your next appointment:   5 month(s) [place] NOV 2022  The format for your next appointment:   In Person  Provider:   Shelva Majestic, MD   Other Instructions  SPLIT - NIGHT WITH  BI-PAP TITRATION    Signed, Shelva Majestic, MD  08/03/2020 6:11 PM    [facility] [address], [address], [place], [place]  [address] Phone: [phone]

## 2020-07-27 NOTE — Patient Instructions (Addendum)
Medication Instructions:  NO CHANGES *If you need a refill on your cardiac medications before your next appointment, please call your pharmacy*   Lab Work: NOT NEEDED     Testing/Procedures: Your physician has recommended that you have a sleep study  SPLIT STUDY WITH BI-PAP. This test records several body functions during sleep, including: brain activity, eye movement, oxygen and carbon dioxide blood levels, heart rate and rhythm, breathing rate and rhythm, the flow of air through your mouth and nose, snoring, body muscle movements, and chest and belly movement.    Follow-Up: At [facility], you and your health needs are our priority.  As part of our continuing mission to provide you with exceptional heart care, we have created designated Provider Care Teams.  These Care Teams include your primary Cardiologist (physician) and Advanced Practice Providers (APPs -  Physician Assistants and Nurse Practitioners) who all work together to provide you with the care you need, when you need it.     Your next appointment:   5 month(s) [place] NOV 2022  The format for your next appointment:   In Person  Provider:   Shelva Majestic, MD   Other Instructions  SPLIT - NIGHT WITH  BI-PAP TITRATION

## 2020-07-29 DIAGNOSIS — M5416 Radiculopathy, lumbar region: Secondary | ICD-10-CM | POA: Diagnosis not present

## 2020-07-29 DIAGNOSIS — M1711 Unilateral primary osteoarthritis, right knee: Secondary | ICD-10-CM | POA: Diagnosis not present

## 2020-07-29 DIAGNOSIS — M961 Postlaminectomy syndrome, not elsewhere classified: Secondary | ICD-10-CM | POA: Diagnosis not present

## 2020-08-05 ENCOUNTER — Telehealth: Payer: Self-pay | Admitting: *Deleted

## 2020-08-05 NOTE — Telephone Encounter (Signed)
Prior Authorization for split night sleep study sent to [facility] via web portal. Tracking Number [id number].

## 2020-08-05 NOTE — Telephone Encounter (Signed)
-----   Message from Raiford Simmonds, RN sent at 07/27/2020 10:27 AM EDT ----- Regarding: SPILT NIGHT WITH BI PAP TITRATION    NEED SCHEDULE SPILT NIGHT WITH BI PAP TITRATION THEN F/U WITH DR Olar

## 2020-08-06 NOTE — Progress Notes (Signed)
Cardiology Office Note   Date:  08/07/2020   ID:  Harold [name], DOB 20-May-1965, MRN [id number]  PCP:  Biagio Borg, MD  Cardiologist:   Minus Breeding, MD   Chief Complaint  Patient presents with  . Back Pain      History of Present Illness: Harold [name] is a 55 y.o. male  who presents for evaluation of multiple cardiovascular risk factors.  I saw him in the past for pericarditis.    In 2017 he had a POET (Plain Old Exercise Treadmill) after the last visit.  He had no ischemia but did have a hypertensive response.     Since I last saw him he had lumbar spinal fusion.     He has some trouble getting around with his back.The patient denies any new symptoms such as chest discomfort, neck or arm discomfort. There has been no new shortness of breath, PND or orthopnea. There have been no reported palpitations, presyncope or syncope.  He is probably going to need to have another back surgery.   Past Medical History:  Diagnosis Date  . Acute idiopathic pericarditis 04/08/2010   Qualifier: Diagnosis of  By: [name], [name]    . Acute sinusitis 06/04/2014  . Anxiety   . CHF (congestive heart failure) ([place]) 06/29/2010   was pericardititis not CHF  . Chronic back pain   . Constipation   . DDD (degenerative disc disease), lumbar 09/14/2012  . Depression   . Erectile dysfunction 09/14/2012  . GERD (gastroesophageal reflux disease)    ocassional  . Gout, unspecified 05/01/2007   Qualifier: Diagnosis of  By: [name] MD, [name]   . Headache    migraines in the past  . History of pancreatitis    a. admx 04-2009.Marland KitchenMarland Kitchen? 2-2 triglycerides  . Hypertension   . Hypertriglyceridemia    a. followed by [facility]  . Left sided sciatica 09/14/2012  . Neuropathy   . Obesity   . OSA (obstructive sleep apnea)    CPAP- not current  . Restless legs   . Type 2 diabetes mellitus with diabetic neuropathy, unspecified ([place]) 09/20/2019    Past Surgical History:  Procedure Laterality Date  .  COLONOSCOPY    . KNEE SURGERY Right    x3 scopes  . KNEE SURGERY Right    open menisicus repair  . LUMBAR LAMINECTOMY/DECOMPRESSION MICRODISCECTOMY N/A 08/26/2015   Procedure: Lumbar Two-Sacral One  Laminectomy for decompression;  Surgeon: [name], MD;  Location: [facility];  Service: Neurosurgery;  Laterality: N/A;  . pericardectomy  07/15/2010   Dr. [name]  . pleurx catheter placement  07/15/2010   Dr. [name]  . SHOULDER SURGERY     right and left shoulders- Rotator cuff repair  . TONSILLECTOMY AND ADENOIDECTOMY     one tonsil     Current Outpatient Medications  Medication Sig Dispense Refill  . amLODipine (NORVASC) 10 MG tablet TAKE 1 TABLET(10 MG) BY MOUTH DAILY 30 tablet 0  . ammonium lactate (AMLACTIN) 12 % lotion Apply 1 application topically as needed for dry skin. 400 g 3  . ascorbic acid (VITAMIN C) 500 MG tablet Take 500 mg by mouth daily.    [name] aspirin EC 81 MG tablet Take 81 mg by mouth daily.    . Blood Glucose Monitoring Suppl (ONE TOUCH ULTRA 2) w/Device KIT Use as directed daily E11.9 1 each 0  . citalopram (CELEXA) 40 MG tablet TAKE ONE TABLET BY MOUTH DAILY (Patient  taking differently: Take 40 mg by mouth daily.) 90 tablet 2  . clotrimazole-betamethasone (LOTRISONE) cream Use as directed twice daily as needed 15 g 1  . diclofenac Sodium (VOLTAREN) 1 % GEL Apply 1 application topically 3 (three) times daily as needed (PAIN).     . fenofibrate (TRICOR) 145 MG tablet TAKE ONE TABLET BY MOUTH DAILY (Patient taking differently: Take 145 mg by mouth daily.) 90 tablet 2  . fluconazole (DIFLUCAN) 100 MG tablet 1 tablet daily by mouth 7 tablet 0  . fluticasone (FLONASE) 50 MCG/ACT nasal spray Place 2 sprays into both nostrils daily. 16 g 6  . furosemide (LASIX) 40 MG tablet Take 1 tablet (40 mg total) by mouth daily. 90 tablet 3  . gabapentin (NEURONTIN) 300 MG capsule Take 900 mg by mouth 3 (three) times daily.    [name] glucose blood test strip Use as instructed  once daily E119 100 each 12  . ibuprofen (ADVIL) 800 MG tablet     . Lancets MISC Use as directed daily E11.9 100 each 11  . lisinopril (ZESTRIL) 40 MG tablet TAKE ONE TABLET BY MOUTH DAILY (Patient taking differently: Take 40 mg by mouth daily.) 90 tablet 2  . loratadine (CLARITIN) 10 MG tablet Take 1 tablet (10 mg total) by mouth daily as needed for allergies. 90 tablet 3  . metFORMIN (GLUCOPHAGE-XR) 500 MG 24 hr tablet TAKE TWO TABLETS BY MOUTH DAILY WITH BREAKFAST 180 tablet 3  . Multiple Minerals-Vitamins (CAL MAG ZINC +D3 PO) Take 1 tablet by mouth daily.    [name] oxyCODONE-acetaminophen (PERCOCET) 10-325 MG tablet Take 1 tablet by mouth every 6 (six) hours as needed for pain. 30 tablet 0  . Potassium Chloride ER 20 MEQ TBCR TAKE ONE TABLET BY MOUTH DAILY (Patient taking differently: Take 20 mEq by mouth daily.) 90 tablet 2  . rosuvastatin (CRESTOR) 20 MG tablet TAKE 1 TABLET(20 MG) BY MOUTH DAILY 30 tablet 0  . sildenafil (VIAGRA) 100 MG tablet Take 0.5-1 tablets (50-100 mg total) by mouth daily as needed for erectile dysfunction. 5 tablet 11  . terbinafine (LAMISIL) 250 MG tablet Take 1 tablet (250 mg total) by mouth daily. (Patient taking differently: Take 250 mg by mouth daily as needed.) 90 tablet 1  . tiZANidine (ZANAFLEX) 4 MG tablet Take 4 mg by mouth 3 (three) times daily as needed for muscle spasms.    [name] tolterodine (DETROL) 2 MG tablet TAKE 1 TABLET TWICE DAILY (Patient taking differently: Take 2 mg by mouth at bedtime.) 180 tablet 1  . vitamin B-12 (CYANOCOBALAMIN) 1000 MCG tablet Take 1 tablet (1,000 mcg total) by mouth daily. 90 tablet 1   No current facility-administered medications for this visit.    Allergies:   Other, Diphenhydramine, Pecan nut (diagnostic), Colchicine, Lipitor [atorvastatin], and Septra [sulfamethoxazole-trimethoprim]    ROS:  Please see the history of present illness.   Otherwise, review of systems are positive for none.   All other systems are reviewed  and negative.    PHYSICAL EXAM: VS:  BP 130/88   Pulse 66   Ht '5\' 11"'  (1.803 m)   Wt 282 lb (127.9 kg)   SpO2 97%   BMI 39.33 kg/m  , BMI Body mass index is 39.33 kg/m. GENERAL:  Well appearing NECK:  No jugular venous distention, waveform within normal limits, carotid upstroke brisk and symmetric, no bruits, no thyromegaly LUNGS:  Clear to auscultation bilaterally CHEST: Well-healed surgical scar HEART:  PMI not displaced or sustained,S1 and S2 within  normal limits, no S3, no S4, no clicks, no rubs, no murmurs ABD:  Flat, positive bowel sounds normal in frequency in pitch, no bruits, no rebound, no guarding, no midline pulsatile mass, no hepatomegaly, no splenomegaly EXT:  2 plus pulses throughout, no edema, no cyanosis no clubbing   EKG:  EKG is  ordered today. The ekg ordered today demonstrates sinus rhythm, rate 66, axis within normal limits, QTC borderline, poor anterior R wave progression, no acute ST-T wave changes.   Recent Labs: 01/06/2020: BUN 10; Creatinine, Ser 0.74; Potassium 3.6; Sodium 138 01/09/2020: Hemoglobin 12.5; Platelets 264    Lipid Panel    Component Value Date/Time   CHOL 149 08/07/2019 1421   TRIG 188.0 (H) 08/07/2019 1421   HDL 26.60 (L) 08/07/2019 1421   CHOLHDL 6 08/07/2019 1421   VLDL 37.6 08/07/2019 1421   LDLCALC 85 08/07/2019 1421   LDLDIRECT 53.0 02/13/2018 1641      Wt Readings from Last 3 Encounters:  08/07/20 282 lb (127.9 kg)  07/27/20 263 lb 6.4 oz (119.5 kg)  04/09/20 260 lb 8 oz (118.2 kg)      Other studies Reviewed: Additional studies/ records that were reviewed today include: Labs Review of the above records demonstrates:  Please see elsewhere in the note.     ASSESSMENT AND PLAN:   OBESITY:He has gained some weight.  We talked specifically about weight loss strategies.  DM: A1c was 6.7.  He probably needs up titration of his metformin base when he had follow-up with Dr. Jenny Reichmann very soon.  TOBACCO ABUSE: We  talked again about the need to stop smoking.  HTN:     BP is at target listed.   QT PROLONGED:    This is borderline.  He should avoid QT prolonging drugs.  SLEEP APNEA: He is being managed by Dr. Claiborne Billings for sleep apnea.  He is going to get a new mask  PERICARDIAL EFFUSION:    He has had no recurrence of this.   FAMILY HISTORY OF EARLY CAD: The patient has significant cardiovascular risk factors including early family history.  He is intermediate risk using the pooled cohort analysis.  Calcium score is indicated.   Current medicines are reviewed at length with the patient today.  The patient does not have concerns regarding medicines.  The following changes have been made: None  Labs/ tests ordered today include:   Orders Placed This Encounter  Procedures  . EKG 12-Lead     Disposition:   FU with me in 1 year.     Signed, Minus Breeding, MD  08/07/2020 10:03 AM    [facility]

## 2020-08-07 ENCOUNTER — Other Ambulatory Visit: Payer: Self-pay | Admitting: Cardiovascular Disease

## 2020-08-07 ENCOUNTER — Ambulatory Visit: Payer: Medicare HMO | Admitting: Cardiology

## 2020-08-07 ENCOUNTER — Other Ambulatory Visit: Payer: Self-pay

## 2020-08-07 ENCOUNTER — Encounter: Payer: Self-pay | Admitting: Cardiology

## 2020-08-07 ENCOUNTER — Telehealth: Payer: Self-pay | Admitting: Licensed Clinical Social Worker

## 2020-08-07 ENCOUNTER — Other Ambulatory Visit (INDEPENDENT_AMBULATORY_CARE_PROVIDER_SITE_OTHER): Payer: Medicare HMO

## 2020-08-07 VITALS — BP 130/88 | HR 66 | Ht 71.0 in | Wt 282.0 lb

## 2020-08-07 DIAGNOSIS — G4733 Obstructive sleep apnea (adult) (pediatric): Secondary | ICD-10-CM

## 2020-08-07 DIAGNOSIS — I300 Acute nonspecific idiopathic pericarditis: Secondary | ICD-10-CM | POA: Diagnosis not present

## 2020-08-07 DIAGNOSIS — R9431 Abnormal electrocardiogram [ECG] [EKG]: Secondary | ICD-10-CM | POA: Diagnosis not present

## 2020-08-07 DIAGNOSIS — E559 Vitamin D deficiency, unspecified: Secondary | ICD-10-CM

## 2020-08-07 DIAGNOSIS — I10 Essential (primary) hypertension: Secondary | ICD-10-CM

## 2020-08-07 DIAGNOSIS — E1169 Type 2 diabetes mellitus with other specified complication: Secondary | ICD-10-CM

## 2020-08-07 DIAGNOSIS — Z125 Encounter for screening for malignant neoplasm of prostate: Secondary | ICD-10-CM | POA: Diagnosis not present

## 2020-08-07 DIAGNOSIS — Z0000 Encounter for general adult medical examination without abnormal findings: Secondary | ICD-10-CM

## 2020-08-07 DIAGNOSIS — E538 Deficiency of other specified B group vitamins: Secondary | ICD-10-CM

## 2020-08-07 LAB — HEPATIC FUNCTION PANEL
ALT: 18 U/L (ref 0–53)
AST: 16 U/L (ref 0–37)
Albumin: 4.1 g/dL (ref 3.5–5.2)
Alkaline Phosphatase: 64 U/L (ref 39–117)
Bilirubin, Direct: 0.1 mg/dL (ref 0.0–0.3)
Total Bilirubin: 0.3 mg/dL (ref 0.2–1.2)
Total Protein: 6.5 g/dL (ref 6.0–8.3)

## 2020-08-07 LAB — CBC WITH DIFFERENTIAL/PLATELET
Basophils Absolute: 0.1 10*3/uL (ref 0.0–0.1)
Basophils Relative: 0.7 % (ref 0.0–3.0)
Eosinophils Absolute: 0.2 10*3/uL (ref 0.0–0.7)
Eosinophils Relative: 2.1 % (ref 0.0–5.0)
HCT: 41.9 % (ref 39.0–52.0)
Hemoglobin: 13.9 g/dL (ref 13.0–17.0)
Lymphocytes Relative: 27.7 % (ref 12.0–46.0)
Lymphs Abs: 3.3 10*3/uL (ref 0.7–4.0)
MCHC: 33.3 g/dL (ref 30.0–36.0)
MCV: 93.9 fl (ref 78.0–100.0)
Monocytes Absolute: 1.3 10*3/uL — ABNORMAL HIGH (ref 0.1–1.0)
Monocytes Relative: 10.8 % (ref 3.0–12.0)
Neutro Abs: 6.9 10*3/uL (ref 1.4–7.7)
Neutrophils Relative %: 58.7 % (ref 43.0–77.0)
Platelets: 354 10*3/uL (ref 150.0–400.0)
RBC: 4.46 Mil/uL (ref 4.22–5.81)
RDW: 15.2 % (ref 11.5–15.5)
WBC: 11.8 10*3/uL — ABNORMAL HIGH (ref 4.0–10.5)

## 2020-08-07 LAB — VITAMIN D 25 HYDROXY (VIT D DEFICIENCY, FRACTURES): VITD: 22.13 ng/mL — ABNORMAL LOW (ref 30.00–100.00)

## 2020-08-07 LAB — BASIC METABOLIC PANEL
BUN: 11 mg/dL (ref 6–23)
CO2: 28 mEq/L (ref 19–32)
Calcium: 9.6 mg/dL (ref 8.4–10.5)
Chloride: 102 mEq/L (ref 96–112)
Creatinine, Ser: 0.8 mg/dL (ref 0.40–1.50)
GFR: 101.37 mL/min (ref >60.00)
Glucose, Bld: 108 mg/dL — ABNORMAL HIGH (ref 70–99)
Potassium: 3.4 mEq/L — ABNORMAL LOW (ref 3.5–5.1)
Sodium: 141 mEq/L (ref 135–145)

## 2020-08-07 LAB — URINALYSIS, ROUTINE W REFLEX MICROSCOPIC
Hgb urine dipstick: NEGATIVE
Leukocytes,Ua: NEGATIVE
Nitrite: NEGATIVE
Specific Gravity, Urine: 1.015 (ref 1.000–1.030)
Total Protein, Urine: NEGATIVE
Urine Glucose: NEGATIVE
Urobilinogen, UA: 1 (ref 0.0–1.0)
pH: 6.5 (ref 5.0–8.0)

## 2020-08-07 LAB — MICROALBUMIN / CREATININE URINE RATIO
Creatinine,U: 185.4 mg/dL
Microalb Creat Ratio: 2.6 mg/g (ref 0.0–30.0)
Microalb, Ur: 4.9 mg/dL — ABNORMAL HIGH (ref 0.0–1.9)

## 2020-08-07 LAB — LDL CHOLESTEROL, DIRECT: Direct LDL: 76 mg/dL

## 2020-08-07 LAB — PSA: PSA: 1.77 ng/mL (ref 0.10–4.00)

## 2020-08-07 LAB — VITAMIN B12: Vitamin B-12: 343 pg/mL (ref 211–911)

## 2020-08-07 LAB — LIPID PANEL
Cholesterol: 150 mg/dL (ref 0–200)
HDL: 27.6 mg/dL — ABNORMAL LOW (ref >39.00)
NonHDL: 122.65
Total CHOL/HDL Ratio: 5
Triglycerides: 282 mg/dL — ABNORMAL HIGH (ref 0.0–149.0)
VLDL: 56.4 mg/dL — ABNORMAL HIGH (ref 0.0–40.0)

## 2020-08-07 LAB — TSH: TSH: 0.87 u[IU]/mL (ref 0.35–4.50)

## 2020-08-07 LAB — HEMOGLOBIN A1C: Hgb A1c MFr Bld: 6.6 % — ABNORMAL HIGH (ref 4.6–6.5)

## 2020-08-07 NOTE — Patient Instructions (Signed)

## 2020-08-07 NOTE — Progress Notes (Signed)
Heart and Vascular Care Navigation  08/07/2020  Harold [name] [birth year] [id number]  Reason for Referral:   Engaged with patient by telephone for initial visit for Heart and Vascular Care Coordination.                                                                                                   Assessment:    Pt referred to Care Navigation team to address concern w/ cost of testing. LCSW called pt and was able to reach him at [phone]. Introduced self, role, reason for call. Pt confirmed home address, PCP, and emergency contacts. He shares he lives w/ Harold [name], who he referred to as his roommate. They split home expenses. He is not currently employed, receives disability income ~ $1,600. He receives [employer] income for food assistance.   We discussed that pt recommended for testing and he confirms he does not think he could manage the $99 up front for this test that is required. LCSW shared that if pt is interested we could submit a request to the Patient Care Fund for assistance w/ a utility bill or other outstanding cost in order to free up money for the testing. Pt states interest. I requested that he and Harold [name] discuss what might be most helpful since they split expenses. My has my number for any additional questions/concerns and once they have discussed.                           HRT/VAS Care Coordination    Patients Home Cardiology Office [place] Team Social Worker   Social Worker Name: Harold [name] [place], [phone]   Living arrangements for the past 2 months Single Family Home   Lives with: Roommate   Patient Current Insurance Coverage Self-Pay   Patient Has Concern With Paying Medical Bills No   Does Patient Have Prescription Coverage? Yes   Home Assistive Devices/Equipment BIPAP   DME Agency [employer]   Current home services DME      Social History:                                                                              SDOH Screenings   Alcohol Screen: Not on file  Depression (PHQ2-9): Low Risk   . PHQ-2 Score: 0  Financial Resource Strain: Medium Risk  . Difficulty of Paying Living Expenses: Somewhat hard  Food Insecurity: No Food Insecurity  . Worried About Charity fundraiser in the Last Year: Never true  . Ran Out of Food in the Last Year: Never true  Housing: Low Risk   . Last Housing Risk Score: 0  Physical Activity: Not on file  Social Connections: Not on file  Stress: Not on file  Tobacco Use: High Risk  . Smoking Tobacco Use: Current Every Day Smoker  . Smokeless Tobacco Use: Never Used  Transportation Needs: No Transportation Needs  . Lack of Transportation (Medical): No  . Lack of Transportation (Non-Medical): No    SDOH Interventions: Financial Resources:  Financial Strain Interventions: Other (Comment) (referral to Patient Care Fund)   Food Insecurity:  Food Insecurity Interventions: Other (Comment) (pt currently recieves SNAP)  Housing Insecurity:  Housing Interventions: Intervention Not Indicated (splits housing expenses w/ roommate; on fixed income, bills can be high)  Transportation:   Transportation Interventions: Intervention Not Indicated    Follow-up plan:   LCSW awaits return call from pt. If I do not hear back from him I will f/u in 1 week and see if they have an expense that we could assist with to ensure that pt can get further testing scheduled.

## 2020-08-10 ENCOUNTER — Encounter: Payer: Medicare HMO | Admitting: Internal Medicine

## 2020-08-11 ENCOUNTER — Other Ambulatory Visit: Payer: Self-pay

## 2020-08-11 DIAGNOSIS — G959 Disease of spinal cord, unspecified: Secondary | ICD-10-CM | POA: Diagnosis not present

## 2020-08-11 DIAGNOSIS — M47817 Spondylosis without myelopathy or radiculopathy, lumbosacral region: Secondary | ICD-10-CM | POA: Diagnosis not present

## 2020-08-12 ENCOUNTER — Encounter: Payer: Medicare HMO | Admitting: Internal Medicine

## 2020-08-16 DIAGNOSIS — M545 Low back pain, unspecified: Secondary | ICD-10-CM | POA: Diagnosis not present

## 2020-08-16 DIAGNOSIS — M961 Postlaminectomy syndrome, not elsewhere classified: Secondary | ICD-10-CM | POA: Diagnosis not present

## 2020-08-16 DIAGNOSIS — G9009 Other idiopathic peripheral autonomic neuropathy: Secondary | ICD-10-CM | POA: Diagnosis not present

## 2020-08-16 DIAGNOSIS — R2689 Other abnormalities of gait and mobility: Secondary | ICD-10-CM | POA: Diagnosis not present

## 2020-08-16 DIAGNOSIS — G894 Chronic pain syndrome: Secondary | ICD-10-CM | POA: Diagnosis not present

## 2020-08-19 ENCOUNTER — Telehealth: Payer: Self-pay | Admitting: Licensed Clinical Social Worker

## 2020-08-19 NOTE — Telephone Encounter (Signed)
Pt called this Probation officer, shares he spoke with his rooomate that they would find it helpful to submit their electric bill for assistance so they can free up some funding for the calcium score test that was recommended for him by Dr. [name].  Pt encouraged to bring the bill by [facility] to be submitted to [name].   Westley Hummer, MSW, [place]  [phone]

## 2020-08-20 ENCOUNTER — Other Ambulatory Visit: Payer: Self-pay | Admitting: Neurosurgery

## 2020-08-30 ENCOUNTER — Encounter: Payer: Self-pay | Admitting: Internal Medicine

## 2020-08-31 MED ORDER — FENOFIBRATE 145 MG PO TABS: 145.0000 mg | ORAL_TABLET | Freq: Every day | ORAL | 1 refills | Status: DC

## 2020-08-31 MED ORDER — CITALOPRAM HYDROBROMIDE 40 MG PO TABS: 40.0000 mg | ORAL_TABLET | Freq: Every day | ORAL | 1 refills | Status: DC

## 2020-09-09 DIAGNOSIS — M5003 Cervical disc disorder with myelopathy, cervicothoracic region: Secondary | ICD-10-CM | POA: Diagnosis not present

## 2020-09-09 NOTE — Progress Notes (Signed)
Surgical Instructions    Your procedure is scheduled on September 14, 2020.  Report to [facility] Main Entrance "A" at 05:45 A.M., then check in with the Admitting office.  Call this number if you have problems the morning of surgery:  [phone]   If you have any questions prior to your surgery date call [phone]: Open Monday-Friday 8am-4pm    Remember:  Do not eat or drink after midnight the night before your surgery   Take these medicines the morning of surgery with A SIP OF WATER : Amlodipine (Norvasc) Citalopram (Celexa) Fenofibrate (Tricor) Gabapentin (Neurontin)   If needed: Fluticasone (Flonase) Loratadine (Claritin) Oxycodone-Acetaminophen (Percocet) Rosuvastatin (Crestor) Tizanidine (Zanaflex)  WHAT DO I DO ABOUT MY DIABETES MEDICATION?   Do not take oral diabetes medicines (pills) the morning of surgery.  DO NOT TAKE Metformin (Glucophage-XR) the morning of surgery.   HOW TO MANAGE YOUR DIABETES BEFORE AND AFTER SURGERY  Why is it important to control my blood sugar before and after surgery? Improving blood sugar levels before and after surgery helps healing and can limit problems. A way of improving blood sugar control is eating a healthy diet by:  Eating less sugar and carbohydrates  Increasing activity/exercise  Talking with your doctor about reaching your blood sugar goals High blood sugars (greater than 180 mg/dL) can raise your risk of infections and slow your recovery, so you will need to focus on controlling your diabetes during the weeks before surgery. Make sure that the doctor who takes care of your diabetes knows about your planned surgery including the date and location.  How do I manage my blood sugar before surgery? Check your blood sugar at least 4 times a day, starting 2 days before surgery, to make sure that the level is not too high or low.  Check your blood sugar the morning of your surgery when you wake up and every 2 hours until you  get to the Short Stay unit.  If your blood sugar is less than 70 mg/dL, you will need to treat for low blood sugar: Do not take insulin. Treat a low blood sugar (less than 70 mg/dL) with  cup of clear juice (cranberry or apple), 4 glucose tablets, OR glucose gel. Recheck blood sugar in 15 minutes after treatment (to make sure it is greater than 70 mg/dL). If your blood sugar is not greater than 70 mg/dL on recheck, call [phone] for further instructions. Report your blood sugar to the short stay nurse when you get to Short Stay.  If you are admitted to the hospital after surgery: Your blood sugar will be checked by the staff and you will probably be given insulin after surgery (instead of oral diabetes medicines) to make sure you have good blood sugar levels. The goal for blood sugar control after surgery is 80-180 mg/dL.    As of today, STOP taking any Aspirin (unless otherwise instructed by your surgeon) Diclofenac Sodium (Voltaren) gel, Aleve, Naproxen, Ibuprofen, Motrin, Advil, Goody's, BC's, all herbal medications, fish oil, and all vitamins.                   Do not wear jewelry Do not wear lotions, powders, perfumes/colognes, or deodorant. Do not shave 48 hours prior to surgery.  Men may shave face and neck. Do not bring valuables to the hospital. Do not wear nail polish, gel polish, artificial nails, or any other type of covering on natural nails including finger and toenails. If patients have artificial nails,  gel coating, etc. that need to be removed by a nail salon, please have this removed prior to surgery.  Surgery may need to be canceled/delayed if the surgeon/ anesthesia feels like the patient is unable to be adequately monitored due to artificial nails, gel coating, etc.   [facility] is not responsible for any belongings or valuables.  Do NOT Smoke (Tobacco/Vaping) or drink Alcohol 24 hours prior to your procedure If you use a CPAP at night, you may bring all equipment  for your overnight stay.   Contacts, glasses, dentures or bridgework may not be worn into surgery, please bring cases for these belongings   For patients admitted to the hospital, discharge time will be determined by your treatment team.   Patients discharged the day of surgery will not be allowed to drive home, and someone needs to stay with them for 24 hours.    Special instructions:   [facility]- Preparing For Surgery  Before surgery, you can play an important role. Because skin is not sterile, your skin needs to be as free of germs as possible. You can reduce the number of germs on your skin by washing with CHG (chlorahexidine gluconate) Soap before surgery.  CHG is an antiseptic cleaner which kills germs and bonds with the skin to continue killing germs even after washing.    Oral Hygiene is also important to reduce your risk of infection.  Remember - BRUSH YOUR TEETH THE MORNING OF SURGERY WITH YOUR REGULAR TOOTHPASTE  Please do not use if you have an allergy to CHG or antibacterial soaps. If your skin becomes reddened/irritated stop using the CHG.  Do not shave (including legs and underarms) for at least 48 hours prior to first CHG shower. It is OK to shave your face.  Please follow these instructions carefully.   Shower the NIGHT BEFORE SURGERY and the MORNING OF SURGERY  If you chose to wash your hair, wash your hair first as usual with your normal shampoo.  After you shampoo, rinse your hair and body thoroughly to remove the shampoo.  Wash Face and genitals (private parts) with your normal soap.   Use CHG Soap as you would any other liquid soap. You can apply CHG directly to the skin and wash gently with a scrungie or a clean washcloth.   Apply the CHG Soap to your body ONLY FROM THE NECK DOWN.  Do not use on open wounds or open sores. Avoid contact with your eyes, ears, mouth and genitals (private parts).   Wash thoroughly, paying special attention to the area where your  surgery will be performed.  Thoroughly rinse your body with warm water from the neck down.  DO NOT shower/wash with your normal soap after using and rinsing off the CHG Soap.  Pat yourself dry with a CLEAN TOWEL.  Wear CLEAN PAJAMAS to bed the night before surgery  Place CLEAN SHEETS on your bed the night before your surgery  DO NOT SLEEP WITH PETS.   Day of Surgery: Take a shower with CHG soap as directed above. Wear Clean/Comfortable clothing the morning of surgery Do not apply any deodorants/lotions.   Remember to brush your teeth WITH YOUR REGULAR TOOTHPASTE.   Please read over the following fact sheets that you were given.

## 2020-09-10 ENCOUNTER — Other Ambulatory Visit: Payer: Self-pay

## 2020-09-10 ENCOUNTER — Encounter (HOSPITAL_COMMUNITY)
Admission: RE | Admit: 2020-09-10 | Discharge: 2020-09-10 | Disposition: A | Discharge status: Home / Self Care | Payer: Medicare HMO | Source: Ambulatory Visit | Attending: Neurosurgery | Admitting: Neurosurgery

## 2020-09-10 ENCOUNTER — Encounter (HOSPITAL_COMMUNITY): Payer: Self-pay

## 2020-09-10 DIAGNOSIS — Z01812 Encounter for preprocedural laboratory examination: Secondary | ICD-10-CM | POA: Diagnosis not present

## 2020-09-10 DIAGNOSIS — Z20822 Contact with and (suspected) exposure to covid-19: Secondary | ICD-10-CM | POA: Insufficient documentation

## 2020-09-10 LAB — CBC
HCT: 44.9 % (ref 39.0–52.0)
Hemoglobin: 14.1 g/dL (ref 13.0–17.0)
MCH: 30.7 pg (ref 26.0–34.0)
MCHC: 31.4 g/dL (ref 30.0–36.0)
MCV: 97.8 fL (ref 80.0–100.0)
Platelets: 421 10*3/uL — ABNORMAL HIGH (ref 150–400)
RBC: 4.59 MIL/uL (ref 4.22–5.81)
RDW: 13.1 % (ref 11.5–15.5)
WBC: 10.4 10*3/uL (ref 4.0–10.5)
nRBC: 0 % (ref 0.0–0.2)

## 2020-09-10 LAB — BASIC METABOLIC PANEL
Anion gap: 11 (ref 5–15)
BUN: 7 mg/dL (ref 6–20)
CO2: 28 mmol/L (ref 22–32)
Calcium: 9.2 mg/dL (ref 8.9–10.3)
Chloride: 99 mmol/L (ref 98–111)
Creatinine, Ser: 0.78 mg/dL (ref 0.61–1.24)
GFR, Estimated: >60 mL/min (ref >60)
Glucose, Bld: 151 mg/dL — ABNORMAL HIGH (ref 70–99)
Potassium: 3.2 mmol/L — ABNORMAL LOW (ref 3.5–5.1)
Sodium: 138 mmol/L (ref 135–145)

## 2020-09-10 LAB — SURGICAL PCR SCREEN
MRSA, PCR: NEGATIVE
Staphylococcus aureus: NEGATIVE

## 2020-09-10 LAB — GLUCOSE, CAPILLARY: Glucose-Capillary: 149 mg/dL — ABNORMAL HIGH (ref 70–99)

## 2020-09-10 NOTE — Progress Notes (Signed)
PCP: Dr. Jeneen Rinks Cardiologist: Dr. [name]  EKG:08-07-20 CXR: n/a ECHO: 03/26/2010 Stress Test:08/12/2015 Cardiac Cath: denies Sleep Study: Last in 2019--used to wear Bipap, doesn't currently wear anything.  Having another sleep study tomorrow.  Covid tested today, aware of precautions  Fasting Blood Sugar- 90-100 Checks Blood Sugar twice a week.  Last A1C 6.6  Patient denies shortness of breath, fever, cough, and chest pain at PAT appointment.  Patient verbalized understanding of instructions provided today at the PAT appointment.  Patient asked to review instructions at home and day of surgery.

## 2020-09-11 LAB — SARS CORONAVIRUS 2 (TAT 6-24 HRS): SARS Coronavirus 2: NEGATIVE

## 2020-09-13 NOTE — Anesthesia Preprocedure Evaluation (Addendum)
Anesthesia Evaluation  Patient identified by MRN, date of birth, ID band Patient awake    Reviewed: Allergy & Precautions, NPO status , Patient's Chart, lab work & pertinent test results, reviewed documented beta blocker date and time   Airway Mallampati: III  TM Distance: >3 FB Neck ROM: Full    Dental  (+) Dental Advisory Given   Pulmonary sleep apnea , Current Smoker,    Pulmonary exam normal breath sounds clear to auscultation       Cardiovascular hypertension, Pt. on medications +CHF  Normal cardiovascular exam Rhythm:Regular Rate:Normal  EKG 08/07/20 NSR, normal   Neuro/Psych  Headaches, PSYCHIATRIC DISORDERS Anxiety Depression Diabetic neuropathy Restless legs syndrome Chronic pain  Neuromuscular disease    GI/Hepatic Neg liver ROS, GERD  Medicated and Controlled,  Endo/Other  diabetes, Well Controlled, Type 2, Oral Hypoglycemic AgentsGout Hyperlipidemia Obesity  Renal/GU negative Renal ROS  negative genitourinary   Musculoskeletal  (+) Arthritis , Osteoarthritis,  Cervical myelopathy   Abdominal (+) + obese,   Peds  Hematology negative hematology ROS (+)   Anesthesia Other Findings   Reproductive/Obstetrics                            Anesthesia Physical Anesthesia Plan  ASA: 3  Anesthesia Plan: General   Post-op Pain Management:    Induction: Intravenous  PONV Risk Score and Plan: 2 and Treatment may vary due to age or medical condition, Midazolam and Ondansetron  Airway Management Planned: Oral ETT  Additional Equipment:   Intra-op Plan:   Post-operative Plan: Extubation in OR  Informed Consent: I have reviewed the patients History and Physical, chart, labs and discussed the procedure including the risks, benefits and alternatives for the proposed anesthesia with the patient or authorized representative who has indicated his/her understanding and acceptance.      Dental advisory given  Plan Discussed with: CRNA and Anesthesiologist  Anesthesia Plan Comments:        Anesthesia Quick Evaluation

## 2020-09-14 ENCOUNTER — Ambulatory Visit (HOSPITAL_COMMUNITY): Payer: Medicare HMO | Admitting: Anesthesiology

## 2020-09-14 ENCOUNTER — Encounter (HOSPITAL_COMMUNITY): Payer: Self-pay | Admitting: Neurosurgery

## 2020-09-14 ENCOUNTER — Ambulatory Visit (HOSPITAL_COMMUNITY)
Admission: RE | Admit: 2020-09-14 | Discharge: 2020-09-14 | Disposition: A | Discharge status: Home / Self Care | Payer: Medicare HMO | Attending: Neurosurgery | Admitting: Neurosurgery

## 2020-09-14 ENCOUNTER — Other Ambulatory Visit: Payer: Self-pay

## 2020-09-14 ENCOUNTER — Ambulatory Visit (HOSPITAL_COMMUNITY): Payer: Medicare HMO

## 2020-09-14 ENCOUNTER — Encounter (HOSPITAL_COMMUNITY)
Admission: RE | Disposition: A | Discharge status: Home / Self Care | Payer: Self-pay | Source: Home / Self Care | Attending: Neurosurgery

## 2020-09-14 DIAGNOSIS — Z79899 Other long term (current) drug therapy: Secondary | ICD-10-CM | POA: Insufficient documentation

## 2020-09-14 DIAGNOSIS — I509 Heart failure, unspecified: Secondary | ICD-10-CM | POA: Diagnosis not present

## 2020-09-14 DIAGNOSIS — M5000 Cervical disc disorder with myelopathy, unspecified cervical region: Secondary | ICD-10-CM | POA: Diagnosis present

## 2020-09-14 DIAGNOSIS — Z91018 Allergy to other foods: Secondary | ICD-10-CM | POA: Diagnosis not present

## 2020-09-14 DIAGNOSIS — M47812 Spondylosis without myelopathy or radiculopathy, cervical region: Secondary | ICD-10-CM | POA: Diagnosis not present

## 2020-09-14 DIAGNOSIS — Z419 Encounter for procedure for purposes other than remedying health state, unspecified: Secondary | ICD-10-CM

## 2020-09-14 DIAGNOSIS — Z7984 Long term (current) use of oral hypoglycemic drugs: Secondary | ICD-10-CM | POA: Insufficient documentation

## 2020-09-14 DIAGNOSIS — Z888 Allergy status to other drugs, medicaments and biological substances status: Secondary | ICD-10-CM | POA: Diagnosis not present

## 2020-09-14 DIAGNOSIS — Z881 Allergy status to other antibiotic agents status: Secondary | ICD-10-CM | POA: Insufficient documentation

## 2020-09-14 DIAGNOSIS — Z7982 Long term (current) use of aspirin: Secondary | ICD-10-CM | POA: Diagnosis not present

## 2020-09-14 DIAGNOSIS — M4712 Other spondylosis with myelopathy, cervical region: Secondary | ICD-10-CM | POA: Insufficient documentation

## 2020-09-14 DIAGNOSIS — M4802 Spinal stenosis, cervical region: Secondary | ICD-10-CM | POA: Diagnosis not present

## 2020-09-14 DIAGNOSIS — F17210 Nicotine dependence, cigarettes, uncomplicated: Secondary | ICD-10-CM | POA: Insufficient documentation

## 2020-09-14 DIAGNOSIS — I110 Hypertensive heart disease with heart failure: Secondary | ICD-10-CM | POA: Diagnosis not present

## 2020-09-14 HISTORY — PX: ANTERIOR CERVICAL DECOMP/DISCECTOMY FUSION: SHX1161

## 2020-09-14 LAB — GLUCOSE, CAPILLARY
Glucose-Capillary: 130 mg/dL — ABNORMAL HIGH (ref 70–99)
Glucose-Capillary: 159 mg/dL — ABNORMAL HIGH (ref 70–99)
Glucose-Capillary: 228 mg/dL — ABNORMAL HIGH (ref 70–99)

## 2020-09-14 SURGERY — ANTERIOR CERVICAL DECOMPRESSION/DISCECTOMY FUSION 1 LEVEL
Anesthesia: General | Site: Spine Cervical

## 2020-09-14 MED ORDER — LORATADINE 10 MG PO TABS: 10.0000 mg | ORAL_TABLET | Freq: Every day | ORAL | Status: DC | PRN

## 2020-09-14 MED ORDER — PROPOFOL 10 MG/ML IV BOLUS
INTRAVENOUS | Status: AC
  Filled 2020-09-14: qty 20

## 2020-09-14 MED ORDER — FENTANYL CITRATE (PF) 250 MCG/5ML IJ SOLN
INTRAMUSCULAR | Status: AC
  Filled 2020-09-14: qty 5

## 2020-09-14 MED ORDER — POTASSIUM CHLORIDE ER 20 MEQ PO TBCR: 1.0000 | EXTENDED_RELEASE_TABLET | Freq: Every day | ORAL | Status: DC

## 2020-09-14 MED ORDER — CEFAZOLIN SODIUM-DEXTROSE 2-4 GM/100ML-% IV SOLN
2.0000 g | Freq: Three times a day (TID) | INTRAVENOUS | Status: DC
  Administered 2020-09-14: 2 g via INTRAVENOUS
  Filled 2020-09-14: qty 100

## 2020-09-14 MED ORDER — CYCLOBENZAPRINE HCL 10 MG PO TABS
10.0000 mg | ORAL_TABLET | Freq: Three times a day (TID) | ORAL | Status: DC | PRN
Start: 2020-09-14 — End: 2020-09-14

## 2020-09-14 MED ORDER — MIDAZOLAM HCL 2 MG/2ML IJ SOLN
INTRAMUSCULAR | Status: AC
  Filled 2020-09-14: qty 2

## 2020-09-14 MED ORDER — OXYCODONE HCL 5 MG PO TABS: 10.0000 mg | ORAL_TABLET | ORAL | Status: DC | PRN

## 2020-09-14 MED ORDER — DEXAMETHASONE SODIUM PHOSPHATE 10 MG/ML IJ SOLN
INTRAMUSCULAR | Status: AC
  Filled 2020-09-14: qty 1

## 2020-09-14 MED ORDER — ONDANSETRON HCL 4 MG PO TABS: 4.0000 mg | ORAL_TABLET | Freq: Four times a day (QID) | ORAL | Status: DC | PRN

## 2020-09-14 MED ORDER — OXYCODONE-ACETAMINOPHEN 10-325 MG PO TABS: 1.0000 | ORAL_TABLET | Freq: Four times a day (QID) | ORAL | 0 refills | Status: DC | PRN

## 2020-09-14 MED ORDER — MAGNESIUM OXIDE 400 MG PO TABS: 400.0000 mg | ORAL_TABLET | Freq: Every day | ORAL | Status: DC

## 2020-09-14 MED ORDER — ONDANSETRON HCL 4 MG/2ML IJ SOLN
INTRAMUSCULAR | Status: DC | PRN
  Administered 2020-09-14: 4 mg via INTRAVENOUS

## 2020-09-14 MED ORDER — PROPOFOL 10 MG/ML IV BOLUS
INTRAVENOUS | Status: DC | PRN
  Administered 2020-09-14: 200 mg via INTRAVENOUS

## 2020-09-14 MED ORDER — OXYCODONE HCL 5 MG PO TABS
5.0000 mg | ORAL_TABLET | Freq: Four times a day (QID) | ORAL | Status: DC | PRN
  Administered 2020-09-14: 5 mg via ORAL
  Filled 2020-09-14: qty 1

## 2020-09-14 MED ORDER — THROMBIN 5000 UNITS EX SOLR
CUTANEOUS | Status: AC
  Filled 2020-09-14: qty 15000

## 2020-09-14 MED ORDER — AMLODIPINE BESYLATE 5 MG PO TABS: 10.0000 mg | ORAL_TABLET | Freq: Every day | ORAL | Status: DC

## 2020-09-14 MED ORDER — ROCURONIUM BROMIDE 10 MG/ML (PF) SYRINGE
PREFILLED_SYRINGE | INTRAVENOUS | Status: DC | PRN
  Administered 2020-09-14: 70 mg via INTRAVENOUS

## 2020-09-14 MED ORDER — THROMBIN 5000 UNITS EX SOLR
OROMUCOSAL | Status: DC | PRN
  Administered 2020-09-14: 5 mL via TOPICAL

## 2020-09-14 MED ORDER — MIDAZOLAM HCL 5 MG/5ML IJ SOLN
INTRAMUSCULAR | Status: DC | PRN
  Administered 2020-09-14: 2 mg via INTRAVENOUS

## 2020-09-14 MED ORDER — ACETAMINOPHEN 650 MG RE SUPP
650.0000 mg | RECTAL | Status: DC | PRN
Start: 2020-09-14 — End: 2020-09-14

## 2020-09-14 MED ORDER — ORAL CARE MOUTH RINSE: 15.0000 mL | Freq: Once | OROMUCOSAL | Status: AC

## 2020-09-14 MED ORDER — ONDANSETRON HCL 4 MG/2ML IJ SOLN: 4.0000 mg | Freq: Once | INTRAMUSCULAR | Status: DC | PRN

## 2020-09-14 MED ORDER — SODIUM CHLORIDE 0.9% FLUSH
3.0000 mL | Freq: Two times a day (BID) | INTRAVENOUS | Status: DC
  Administered 2020-09-14: 3 mL via INTRAVENOUS

## 2020-09-14 MED ORDER — SODIUM CHLORIDE 0.9% FLUSH: 3.0000 mL | INTRAVENOUS | Status: DC | PRN

## 2020-09-14 MED ORDER — LACTATED RINGERS IV SOLN: INTRAVENOUS | Status: DC

## 2020-09-14 MED ORDER — ZINC SULFATE 220 (50 ZN) MG PO CAPS: 220.0000 mg | ORAL_CAPSULE | Freq: Every day | ORAL | Status: DC

## 2020-09-14 MED ORDER — LIDOCAINE HCL (CARDIAC) PF 100 MG/5ML IV SOSY
PREFILLED_SYRINGE | INTRAVENOUS | Status: DC | PRN
  Administered 2020-09-14: 80 mg via INTRAVENOUS

## 2020-09-14 MED ORDER — CEFAZOLIN SODIUM-DEXTROSE 2-4 GM/100ML-% IV SOLN
2.0000 g | INTRAVENOUS | Status: AC
  Administered 2020-09-14: 2 g via INTRAVENOUS

## 2020-09-14 MED ORDER — GABAPENTIN 300 MG PO CAPS
900.0000 mg | ORAL_CAPSULE | Freq: Three times a day (TID) | ORAL | Status: DC
  Administered 2020-09-14: 900 mg via ORAL
  Filled 2020-09-14: qty 3

## 2020-09-14 MED ORDER — HYDROMORPHONE HCL 1 MG/ML IJ SOLN
0.5000 mg | INTRAMUSCULAR | Status: DC | PRN
  Administered 2020-09-14: 0.5 mg via INTRAVENOUS
  Filled 2020-09-14: qty 0.5

## 2020-09-14 MED ORDER — ASPIRIN EC 81 MG PO TBEC: 81.0000 mg | DELAYED_RELEASE_TABLET | Freq: Every day | ORAL | Status: DC

## 2020-09-14 MED ORDER — ALUM & MAG HYDROXIDE-SIMETH 200-200-20 MG/5ML PO SUSP
30.0000 mL | Freq: Four times a day (QID) | ORAL | Status: DC | PRN
  Filled 2020-09-14: qty 30

## 2020-09-14 MED ORDER — FENOFIBRATE 54 MG PO TABS: 54.0000 mg | ORAL_TABLET | Freq: Every day | ORAL | Status: DC

## 2020-09-14 MED ORDER — MENTHOL 3 MG MT LOZG: 1.0000 | LOZENGE | OROMUCOSAL | Status: DC | PRN

## 2020-09-14 MED ORDER — METFORMIN HCL ER 500 MG PO TB24
1000.0000 mg | ORAL_TABLET | Freq: Every day | ORAL | Status: DC
  Administered 2020-09-14: 1000 mg via ORAL
  Filled 2020-09-14: qty 2

## 2020-09-14 MED ORDER — PHENOL 1.4 % MT LIQD: 1.0000 | OROMUCOSAL | Status: DC | PRN

## 2020-09-14 MED ORDER — CHLORHEXIDINE GLUCONATE CLOTH 2 % EX PADS: 6.0000 | MEDICATED_PAD | Freq: Once | CUTANEOUS | Status: DC

## 2020-09-14 MED ORDER — CITALOPRAM HYDROBROMIDE 20 MG PO TABS: 40.0000 mg | ORAL_TABLET | Freq: Every day | ORAL | Status: DC

## 2020-09-14 MED ORDER — VITAMIN B-12 1000 MCG PO TABS: 1000.0000 ug | ORAL_TABLET | Freq: Every day | ORAL | Status: DC

## 2020-09-14 MED ORDER — ROCURONIUM BROMIDE 10 MG/ML (PF) SYRINGE
PREFILLED_SYRINGE | INTRAVENOUS | Status: AC
  Filled 2020-09-14: qty 10

## 2020-09-14 MED ORDER — LISINOPRIL 20 MG PO TABS: 40.0000 mg | ORAL_TABLET | Freq: Every day | ORAL | Status: DC

## 2020-09-14 MED ORDER — FENTANYL CITRATE (PF) 100 MCG/2ML IJ SOLN
INTRAMUSCULAR | Status: DC | PRN
  Administered 2020-09-14: 50 ug via INTRAVENOUS
  Administered 2020-09-14: 200 ug via INTRAVENOUS
  Administered 2020-09-14 (×2): 50 ug via INTRAVENOUS

## 2020-09-14 MED ORDER — DEXAMETHASONE SODIUM PHOSPHATE 10 MG/ML IJ SOLN
10.0000 mg | Freq: Once | INTRAMUSCULAR | Status: AC
  Administered 2020-09-14: 10 mg via INTRAVENOUS

## 2020-09-14 MED ORDER — OXYCODONE-ACETAMINOPHEN 10-325 MG PO TABS: 1.0000 | ORAL_TABLET | Freq: Four times a day (QID) | ORAL | Status: DC | PRN

## 2020-09-14 MED ORDER — DEXMEDETOMIDINE (PRECEDEX) IN NS 20 MCG/5ML (4 MCG/ML) IV SYRINGE
PREFILLED_SYRINGE | INTRAVENOUS | Status: DC | PRN
  Administered 2020-09-14: 20 ug via INTRAVENOUS

## 2020-09-14 MED ORDER — ACETAMINOPHEN 325 MG PO TABS
650.0000 mg | ORAL_TABLET | ORAL | Status: DC | PRN
  Administered 2020-09-14: 650 mg via ORAL
  Filled 2020-09-14: qty 2

## 2020-09-14 MED ORDER — HYDROMORPHONE HCL 1 MG/ML IJ SOLN: 0.2500 mg | INTRAMUSCULAR | Status: DC | PRN

## 2020-09-14 MED ORDER — CHLORHEXIDINE GLUCONATE 0.12 % MT SOLN
15.0000 mL | Freq: Once | OROMUCOSAL | Status: AC
  Administered 2020-09-14: 15 mL via OROMUCOSAL

## 2020-09-14 MED ORDER — THROMBIN 5000 UNITS EX SOLR
CUTANEOUS | Status: DC | PRN
  Administered 2020-09-14 (×2): 5000 [IU] via TOPICAL

## 2020-09-14 MED ORDER — ONDANSETRON HCL 4 MG/2ML IJ SOLN: 4.0000 mg | Freq: Four times a day (QID) | INTRAMUSCULAR | Status: DC | PRN

## 2020-09-14 MED ORDER — OXYCODONE-ACETAMINOPHEN 5-325 MG PO TABS
1.0000 | ORAL_TABLET | Freq: Four times a day (QID) | ORAL | Status: DC | PRN
  Administered 2020-09-14: 1 via ORAL
  Filled 2020-09-14: qty 1

## 2020-09-14 MED ORDER — SODIUM CHLORIDE 0.9 % IV SOLN
250.0000 mL | INTRAVENOUS | Status: DC
  Administered 2020-09-14: 250 mL via INTRAVENOUS

## 2020-09-14 MED ORDER — 0.9 % SODIUM CHLORIDE (POUR BTL) OPTIME
TOPICAL | Status: DC | PRN
  Administered 2020-09-14: 1000 mL

## 2020-09-14 MED ORDER — ROSUVASTATIN CALCIUM 20 MG PO TABS: 20.0000 mg | ORAL_TABLET | Freq: Every day | ORAL | Status: DC

## 2020-09-14 MED ORDER — SUGAMMADEX SODIUM 200 MG/2ML IV SOLN
INTRAVENOUS | Status: DC | PRN
  Administered 2020-09-14: 200 mg via INTRAVENOUS

## 2020-09-14 MED ORDER — PANTOPRAZOLE SODIUM 40 MG IV SOLR: 40.0000 mg | Freq: Every day | INTRAVENOUS | Status: DC

## 2020-09-14 MED ORDER — INSULIN ASPART 100 UNIT/ML IJ SOLN
0.0000 [IU] | Freq: Three times a day (TID) | INTRAMUSCULAR | Status: DC
  Administered 2020-09-14: 4 [IU] via SUBCUTANEOUS

## 2020-09-14 MED ORDER — ZINC GLUCONATE 50 MG PO TABS: 50.0000 mg | ORAL_TABLET | Freq: Every day | ORAL | Status: DC

## 2020-09-14 MED ORDER — CHLORHEXIDINE GLUCONATE 0.12 % MT SOLN
OROMUCOSAL | Status: AC
  Filled 2020-09-14: qty 15

## 2020-09-14 MED ORDER — HEMOSTATIC AGENTS (NO CHARGE) OPTIME
TOPICAL | Status: DC | PRN
  Administered 2020-09-14: 1 via TOPICAL

## 2020-09-14 MED ORDER — OXYCODONE HCL 5 MG PO TABS: 5.0000 mg | ORAL_TABLET | Freq: Once | ORAL | Status: DC | PRN

## 2020-09-14 MED ORDER — METHOCARBAMOL 500 MG PO TABS: 500.0000 mg | ORAL_TABLET | Freq: Four times a day (QID) | ORAL | Status: DC | PRN

## 2020-09-14 MED ORDER — TIZANIDINE HCL 4 MG PO TABS: 4.0000 mg | ORAL_TABLET | Freq: Three times a day (TID) | ORAL | Status: DC | PRN

## 2020-09-14 MED ORDER — CEFAZOLIN SODIUM-DEXTROSE 2-4 GM/100ML-% IV SOLN
INTRAVENOUS | Status: AC
  Filled 2020-09-14: qty 100

## 2020-09-14 MED ORDER — OXYCODONE HCL 5 MG/5ML PO SOLN: 5.0000 mg | Freq: Once | ORAL | Status: DC | PRN

## 2020-09-14 MED ORDER — FLUTICASONE PROPIONATE 50 MCG/ACT NA SUSP: 2.0000 | Freq: Every day | NASAL | Status: DC | PRN

## 2020-09-14 MED ORDER — ASCORBIC ACID 500 MG PO TABS: 500.0000 mg | ORAL_TABLET | Freq: Every day | ORAL | Status: DC

## 2020-09-14 MED ORDER — ONDANSETRON HCL 4 MG/2ML IJ SOLN
INTRAMUSCULAR | Status: AC
  Filled 2020-09-14: qty 2

## 2020-09-14 MED ORDER — PHENYLEPHRINE HCL-NACL 10-0.9 MG/250ML-% IV SOLN
INTRAVENOUS | Status: DC | PRN
  Administered 2020-09-14: 25 ug/min via INTRAVENOUS

## 2020-09-14 SURGICAL SUPPLY — 58 items
BAND RUBBER #18 3X1/16 STRL (MISCELLANEOUS) ×4 IMPLANT
BASKET BONE COLLECTION (BASKET) ×2 IMPLANT
BENZOIN TINCTURE PRP APPL 2/3 (GAUZE/BANDAGES/DRESSINGS) ×2 IMPLANT
BIT DRILL NEURO 2X3.1 SFT TUCH (MISCELLANEOUS) ×1 IMPLANT
BONE VIVIGEN FORMABLE 1.3CC (Bone Implant) ×2 IMPLANT
BUR MATCHSTICK NEURO 3.0 LAGG (BURR) ×2 IMPLANT
CANISTER SUCT 3000ML PPV (MISCELLANEOUS) ×2 IMPLANT
CARTRIDGE OIL MAESTRO DRILL (MISCELLANEOUS) ×1 IMPLANT
CLSR STERI-STRIP ANTIMIC 1/2X4 (GAUZE/BANDAGES/DRESSINGS) ×2 IMPLANT
COVER WAND RF STERILE (DRAPES) ×2 IMPLANT
DERMABOND ADVANCED (GAUZE/BANDAGES/DRESSINGS) ×1
DERMABOND ADVANCED .7 DNX12 (GAUZE/BANDAGES/DRESSINGS) ×1 IMPLANT
DIFFUSER DRILL AIR PNEUMATIC (MISCELLANEOUS) ×2 IMPLANT
DRAPE C-ARM 42X72 X-RAY (DRAPES) ×4 IMPLANT
DRAPE LAPAROTOMY 100X72 PEDS (DRAPES) ×2 IMPLANT
DRAPE MICROSCOPE LEICA (MISCELLANEOUS) ×2 IMPLANT
DRILL NEURO 2X3.1 SOFT TOUCH (MISCELLANEOUS) ×2
DRSG OPSITE POSTOP 4X6 (GAUZE/BANDAGES/DRESSINGS) ×2 IMPLANT
DURAPREP 6ML APPLICATOR 50/CS (WOUND CARE) ×2 IMPLANT
ELECT COATED BLADE 2.86 ST (ELECTRODE) ×2 IMPLANT
ELECT REM PT RETURN 9FT ADLT (ELECTROSURGICAL) ×2
ELECTRODE REM PT RTRN 9FT ADLT (ELECTROSURGICAL) ×1 IMPLANT
GAUZE 4X4 16PLY RFD (DISPOSABLE) IMPLANT
GAUZE SPONGE 4X4 12PLY STRL (GAUZE/BANDAGES/DRESSINGS) ×2 IMPLANT
GLOVE BIO SURGEON STRL SZ7 (GLOVE) ×4 IMPLANT
GLOVE BIO SURGEON STRL SZ8 (GLOVE) ×2 IMPLANT
GLOVE EXAM NITRILE XL STR (GLOVE) IMPLANT
GLOVE INDICATOR 8.5 STRL (GLOVE) ×2 IMPLANT
GLOVE SURG UNDER POLY LF SZ7 (GLOVE) ×4 IMPLANT
GOWN STRL REUS W/ TWL LRG LVL3 (GOWN DISPOSABLE) ×2 IMPLANT
GOWN STRL REUS W/ TWL XL LVL3 (GOWN DISPOSABLE) ×1 IMPLANT
GOWN STRL REUS W/TWL 2XL LVL3 (GOWN DISPOSABLE) IMPLANT
GOWN STRL REUS W/TWL LRG LVL3 (GOWN DISPOSABLE) ×4
GOWN STRL REUS W/TWL XL LVL3 (GOWN DISPOSABLE) ×2
HALTER HD/CHIN CERV TRACTION D (MISCELLANEOUS) ×2 IMPLANT
HEMOSTAT POWDER KIT SURGIFOAM (HEMOSTASIS) ×2 IMPLANT
KIT BASIN OR (CUSTOM PROCEDURE TRAY) ×2 IMPLANT
KIT TURNOVER KIT B (KITS) ×2 IMPLANT
NEEDLE HYPO 18GX1.5 BLUNT FILL (NEEDLE) ×2 IMPLANT
NEEDLE SPNL 20GX3.5 QUINCKE YW (NEEDLE) ×2 IMPLANT
NS IRRIG 1000ML POUR BTL (IV SOLUTION) ×2 IMPLANT
OIL CARTRIDGE MAESTRO DRILL (MISCELLANEOUS) ×2
PACK LAMINECTOMY NEURO (CUSTOM PROCEDURE TRAY) ×2 IMPLANT
PAD ARMBOARD 7.5X6 YLW CONV (MISCELLANEOUS) ×6 IMPLANT
PIN DISTRACTION 14MM (PIN) IMPLANT
PLATE ANT CERV XTEND 1 LV 14 (Plate) ×2 IMPLANT
SCREW VAR 4.2 XD SELF DRILL 16 (Screw) ×6 IMPLANT
SCREW XTEND SELF DRILL 4.6X16 (Screw) ×2 IMPLANT
SPACER HEDRON C 12X14X6 0D (Spacer) ×2 IMPLANT
SPONGE INTESTINAL PEANUT (DISPOSABLE) ×2 IMPLANT
SPONGE SURGIFOAM ABS GEL SZ50 (HEMOSTASIS) ×2 IMPLANT
STRIP CLOSURE SKIN 1/2X4 (GAUZE/BANDAGES/DRESSINGS) ×2 IMPLANT
SUT VIC AB 3-0 SH 8-18 (SUTURE) ×2 IMPLANT
SUT VICRYL 4-0 PS2 18IN ABS (SUTURE) ×2 IMPLANT
TAPE CLOTH 4X10 WHT NS (GAUZE/BANDAGES/DRESSINGS) ×2 IMPLANT
TOWEL GREEN STERILE (TOWEL DISPOSABLE) ×2 IMPLANT
TOWEL GREEN STERILE FF (TOWEL DISPOSABLE) ×2 IMPLANT
WATER STERILE IRR 1000ML POUR (IV SOLUTION) ×2 IMPLANT

## 2020-09-14 NOTE — Transfer of Care (Signed)
Immediate Anesthesia Transfer of Care Note  Patient: CAYDE HELD  Procedure(s) Performed: Anterior Cervical Decompression Fusion - Cervical three-Cervical four (Spine Cervical)  Patient Location: PACU  Anesthesia Type:General  Level of Consciousness: drowsy and patient cooperative  Airway & Oxygen Therapy: Patient Spontanous Breathing and Patient connected to face mask oxygen  Post-op Assessment: Report given to RN, Post -op Vital signs reviewed and stable and Patient moving all extremities  Post vital signs: Reviewed and stable  Last Vitals:  Vitals Value Taken Time  BP 149/93 09/14/20 0959  Temp    Pulse 73 09/14/20 1000  Resp 24 09/14/20 1000  SpO2 95 % 09/14/20 1000  Vitals shown include unvalidated device data.  Last Pain:  Vitals:   09/14/20 0701  TempSrc:   PainSc: 7       Patients Stated Pain Goal: 3 (75/30/05 1102)  Complications: No notable events documented.

## 2020-09-14 NOTE — Discharge Summary (Signed)
Physician Discharge Summary  Patient ID: Harold [name] MRN: [id number] DOB/AGE: Nov 20, 1965 55 y.o.  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 The patient tolerated the procedure well and was taken to the recovery room and then to the floor in stable condition. The hospital course was routine. There were no complications. The wound remained clean dry and intact. Pt had appropriate neck soreness. No complaints of arm pain or new N/T/W. The patient remained afebrile with stable vital signs, and tolerated a regular diet. The patient continued to increase activities, and pain was well controlled with oral pain medications.   Consults: None  Significant Diagnostic Studies:  Results for orders placed or performed during the hospital encounter of 09/14/20  Glucose, capillary  Result Value Ref Range   Glucose-Capillary 130 (H) 70 - 99 mg/dL  Glucose, capillary  Result Value Ref Range   Glucose-Capillary 159 (H) 70 - 99 mg/dL    DG Cervical Spine 1 View  Result Date: 09/14/2020 CLINICAL DATA:  ACDF. EXAM: DG CERVICAL SPINE - 1 VIEW; DG C-ARM 1-60 MIN COMPARISON:  None. FINDINGS: Single intraoperative cross-table lateral view of the cervical spine is submitted. Occiput to C3-4 is visualized. Degenerative changes at C3-4. No hardware is visualized. IMPRESSION: Intraoperative visualization of the upper cervical spine. Electronically Signed   By: Lorin Picket M.D.   On: 09/14/2020 11:49   DG C-Arm 1-60 Min  Result Date: 09/14/2020 CLINICAL DATA:  ACDF. EXAM: DG CERVICAL SPINE - 1 VIEW; DG C-ARM 1-60 MIN COMPARISON:  None. FINDINGS: Single intraoperative cross-table lateral view of the cervical  spine is submitted. Occiput to C3-4 is visualized. Degenerative changes at C3-4. No hardware is visualized. IMPRESSION: Intraoperative visualization of the upper cervical spine. Electronically Signed   By: Lorin Picket M.D.   On: 09/14/2020 11:49    Antibiotics:  Anti-infectives (From admission, onward)    Start     Dose/Rate Route Frequency Ordered Stop   09/14/20 1600  ceFAZolin (ANCEF) IVPB 2g/100 mL premix        2 g 200 mL/hr over 30 Minutes Intravenous Every 8 hours 09/14/20 1048 09/15/20 0759   09/14/20 0647  ceFAZolin (ANCEF) 2-4 GM/100ML-% IVPB       Note to Pharmacy: Granville Lewis, Lindsi   : cabinet override      09/14/20 0647 09/14/20 1859   09/14/20 0645  ceFAZolin (ANCEF) IVPB 2g/100 mL premix        2 g 200 mL/hr over 30 Minutes Intravenous On call to O.R. 09/14/20 4462 09/14/20 0818       Discharge Exam: Blood pressure 132/85, pulse 68, temperature 98.6 F (37 C), temperature source Oral, resp. rate 20, height 5' 11" (1.803 m), weight 113.9 kg, SpO2 94 %. Neurologic: Grossly normal Ambulating and voiding well, incision cdi   Discharge Medications:   Allergies as of 09/14/2020       Reactions   Other Itching, Swelling, Palpitations   Pecans: itching and swelling of the tongue    Diphenhydramine Itching, Palpitations, Other (See Comments)   "jittery" Pt states he can take Benadryl in an emergency situation   Pecan Nut (diagnostic)    Colchicine Diarrhea   Lipitor [atorvastatin] Other (See Comments)   Muscle cramps   Septra [sulfamethoxazole-trimethoprim]  Itching        Medication List     STOP taking these medications    ammonium lactate 12 % lotion Commonly known as: AmLactin   clotrimazole-betamethasone cream Commonly known as: Lotrisone   fluconazole 100 MG tablet Commonly known as: DIFLUCAN   terbinafine 250 MG tablet Commonly known as: LAMISIL   tolterodine 2 MG tablet Commonly known as: DETROL       TAKE these medications    amLODipine  10 MG tablet Commonly known as: NORVASC TAKE 1 TABLET(10 MG) BY MOUTH DAILY What changed: See the new instructions.   ascorbic acid 500 MG tablet Commonly known as: VITAMIN C Take 500 mg by mouth daily.   aspirin EC 81 MG tablet Take 81 mg by mouth daily.   Calcium-Magnesium-Zinc-D3 Tabs Take 1 tablet by mouth daily.   citalopram 40 MG tablet Commonly known as: CELEXA Take 1 tablet (40 mg total) by mouth daily.   diclofenac Sodium 1 % Gel Commonly known as: VOLTAREN Apply 1 application topically 3 (three) times daily as needed (PAIN).   fenofibrate 145 MG tablet Commonly known as: TRICOR Take 1 tablet (145 mg total) by mouth daily.   fluticasone 50 MCG/ACT nasal spray Commonly known as: FLONASE Place 2 sprays into both nostrils daily. What changed:  when to take this reasons to take this   furosemide 40 MG tablet Commonly known as: LASIX Take 1 tablet (40 mg total) by mouth daily. What changed:  when to take this reasons to take this   gabapentin 300 MG capsule Commonly known as: NEURONTIN Take 900 mg by mouth 3 (three) times daily.   glucose blood test strip Use as instructed once daily E119   ibuprofen 800 MG tablet Commonly known as: ADVIL Take 800 mg by mouth 3 (three) times daily.   Lancets Misc Use as directed daily E11.9   lisinopril 40 MG tablet Commonly known as: ZESTRIL TAKE ONE TABLET BY MOUTH DAILY   loratadine 10 MG tablet Commonly known as: CLARITIN Take 1 tablet (10 mg total) by mouth daily as needed for allergies.   magnesium oxide 400 MG tablet Commonly known as: MAG-OX Take 400 mg by mouth daily.   metFORMIN 500 MG 24 hr tablet Commonly known as: GLUCOPHAGE-XR TAKE TWO TABLETS BY MOUTH DAILY WITH BREAKFAST What changed:  how much to take how to take this when to take this additional instructions   methocarbamol 500 MG tablet Commonly known as: ROBAXIN Take 500 mg by mouth 4 (four) times daily as needed for muscle  spasms.   ONE TOUCH ULTRA 2 w/Device Kit Use as directed daily E11.9   oxyCODONE-acetaminophen 10-325 MG tablet Commonly known as: PERCOCET Take 1 tablet by mouth every 6 (six) hours as needed for pain.   Potassium Chloride ER 20 MEQ Tbcr TAKE ONE TABLET BY MOUTH DAILY What changed:  how much to take when to take this reasons to take this   rosuvastatin 20 MG tablet Commonly known as: CRESTOR TAKE 1 TABLET(20 MG) BY MOUTH DAILY What changed: See the new instructions.   sildenafil 100 MG tablet Commonly known as: Viagra Take 0.5-1 tablets (50-100 mg total) by mouth daily as needed for erectile dysfunction.   tiZANidine 4 MG tablet Commonly known as: ZANAFLEX Take 4 mg by mouth 3 (three) times daily as needed for muscle spasms.   vitamin B-12 1000 MCG tablet Commonly known as: CYANOCOBALAMIN Take 1 tablet (1,000 mcg total) by mouth daily.   zinc gluconate 50  MG tablet Take 50 mg by mouth daily.        Disposition: home   Final Dx: acdf C3-4       Signed: Ocie Cornfield Febe Champa 09/14/2020, 4:48 PM

## 2020-09-14 NOTE — Progress Notes (Signed)
Patient alert and oriented, voiding adequately, skin clean, dry and intact without evidence of skin break down, or symptoms of complications - no redness or edema noted, only slight tenderness at site.  Patient states pain is manageable at time of discharge. Patient has an appointment with MD in 2 weeks 

## 2020-09-14 NOTE — Op Note (Signed)
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  Work-up revealed severe cord compression with signal change within his cord at C3-4.  Due to patient's progression of clinical syndrome imaging findings and failed conservative treatment I recommended anterior cervical discectomy and fusion at that level.  I have extensively gone over the risks and benefits of the operation with him as well as perioperative course expectations of outcome and alternatives to surgery and he understood and agreed to proceed forward.  Operative procedure: Patient was brought into the OR was induced under general anesthesia positioned by the neck in slight extension 5 pounds halter traction the right 7 next prepped and draped in routine sterile fashion.  Preoperative x-ray localized the appropriate level.  So a curvilinear incision was made just off the midline to the anterior border of the sternocleidomastoid and the superficial layer of platysma was dissected out divided longitudinally the avascular plane between the sternomastoid and strap muscles was developed down to the prevertebral fascia and prevertebral fascia was dissected away with Kitners.  Intraoperative x-ray confirmed identification appropriate level so the longus goes reflected laterally and self-retaining retractors were placed.  Large anterior osteophytes were bitten off with a Leksell rongeur at C3-4 disc base was identified disc base was scraped and drilled down capturing the bone sick shavings and mucus trap then  under microscope lamination further drilling of the posterior endplates allowed indication of posterior annulus and posterior longitudinal ligament.  Both endplates were aggressively under Bitton and the posterior longitudinal ligament was removed in piecemeal fashion and extensive mount of spurring coming off the endplates was removed primarily coming off the C3 vertebral body but also some contribution with some uncinate hypertrophy and C4.  I aggressively under bit both C3 and C4 decompressing central canal both C4 pedicles identified at both C4 nerve roots were skeletonized and decompressed flush with the pedicle.  At the end of the Discectomy there was no further stenosis either centrally or foraminally I then packed and sized up a 6 mm titanium cage with the locally harvested autograft mix and inserted it 1 to 2 mm deep to the anterior vertebral line.  I then selected a 14 mm globus extend plate placed for 11XB screws all screws had excellent purchase postop fluoroscopy confirmed good position of the implants.  Wounds and copiously irrigated meticulous hemostasis was maintained the wounds then closed in layers with interrupted Vicryl in the platysma and a running 4 subcuticular Dermabond benzoin Steri-Strips and a sterile dressing was applied patient recovery in stable condition.  At the end the case all needle count sponge counts were correct.

## 2020-09-14 NOTE — Anesthesia Postprocedure Evaluation (Signed)
Anesthesia Post Note  Patient: Harold [name]  Procedure(s) Performed: Anterior Cervical Decompression Fusion - Cervical three-Cervical four (Spine Cervical)     Patient location during evaluation: PACU Anesthesia Type: General Level of consciousness: awake and alert and oriented Pain management: pain level controlled Vital Signs Assessment: post-procedure vital signs reviewed and stable Respiratory status: spontaneous breathing, nonlabored ventilation and respiratory function stable Cardiovascular status: blood pressure returned to baseline and stable Postop Assessment: no apparent nausea or vomiting Anesthetic complications: no   No notable events documented.  Last Vitals:  Vitals:   09/14/20 1015 09/14/20 1057  BP: (!) 151/82 132/85  Pulse: 70 68  Resp: (!) 24 20  Temp:  37 C  SpO2: 96% 94%    Last Pain:  Vitals:   09/14/20 1057  TempSrc: Oral  PainSc:                  Tameria Patti A.

## 2020-09-14 NOTE — Discharge Instructions (Signed)
Wound Care  Keep the incision clean and dry remove the outer dressing in 2 days. Do not put any creams, lotions, or ointments on incision. Leave steri-strips on neck.  They will fall off by themselves.  Activity Walk each and every day, increasing distance each day. No lifting greater than 5 lbs.  Avoid excessive neck motion. No lifting no bending no twisting no driving or riding a car unless coming back and forth to see me. Wear neck brace at all times except when showering.   Diet Resume your normal diet.   Return to Work Will be discussed at you follow up appointment.  Call Your Doctor If Any of These Occur Redness, drainage, or swelling at the wound.  Temperature greater than 101 degrees. Severe pain not relieved by pain medication. Incision starts to come apart. Follow Up Appt Call today for appointment in 1-2 weeks ([phone]) or for problems.  If you have any hardware placed in your spine, you will need an x-ray before your appointment.

## 2020-09-14 NOTE — H&P (Signed)
MILAM ALLBAUGH is an 55 y.o. male.   Chief Complaint: Neck pain hand numbness tingling worse on the left difficulty walking balance HPI: 55 year old with progressive worsening neck pain numbness tingling weakness in his hands difficulty walking and clinical exam consistent with myelopathy.  Work-up revealed severe cord compression C3-4 from a large disc herniation.  Due to patient's progression of clinical syndrome imaging findings and failed conservative treatment I recommended anterior cervical discectomy and fusion at that level.  I extensively explained the risks and benefits perioperative course expectations of outcome and alternatives of surgery and he understood and agreed to proceed forward.  Past Medical History:  Diagnosis Date   Acute idiopathic pericarditis 04/08/2010   Qualifier: Diagnosis of  By: [name], [name]     Acute sinusitis 06/04/2014   Anxiety    CHF (congestive heart failure) ([place]) 06/29/2010   was pericardititis not CHF   Chronic back pain    Constipation    DDD (degenerative disc disease), lumbar 09/14/2012   Depression    Erectile dysfunction 09/14/2012   GERD (gastroesophageal reflux disease)    ocassional   Gout, unspecified 05/01/2007   Qualifier: Diagnosis of  By: [name] MD, Dellis Filbert A    Headache    migraines in the past   History of pancreatitis    a. admx 04-2009.Marland KitchenMarland Kitchen? 2-2 triglycerides   Hypertension    Hypertriglyceridemia    a. followed by [facility]   Left sided sciatica 09/14/2012   Neuropathy    Obesity    OSA (obstructive sleep apnea)    CPAP- not current   Restless legs    Type 2 diabetes mellitus with diabetic neuropathy, unspecified ([place]) 09/20/2019    Past Surgical History:  Procedure Laterality Date   COLONOSCOPY     KNEE SURGERY Right    x3 scopes   KNEE SURGERY Right    open menisicus repair   LUMBAR LAMINECTOMY/DECOMPRESSION MICRODISCECTOMY N/A 08/26/2015   Procedure: Lumbar Two-Sacral One  Laminectomy for decompression;  Surgeon:  [name], MD;  Location: [facility];  Service: Neurosurgery;  Laterality: N/A;   pericardectomy  07/15/2010   Dr. [name]   pleurx catheter placement  07/15/2010   Dr. [name]   SHOULDER SURGERY     right and left shoulders- Rotator cuff repair   TONSILLECTOMY AND ADENOIDECTOMY     one tonsil    Family History  Problem Relation Age of Onset   Diabetes Mother    Diabetes Father    Colon cancer Father 37   Heart disease Father 43       CABG   Pancreatitis Father    Hypertension Other        entire family   Liver cancer Paternal Uncle    Diabetes Sister    Stomach cancer Maternal Grandfather    Pancreatitis Paternal Uncle        x 2   Esophageal cancer Neg Hx    Rectal cancer Neg Hx    Colon polyps Neg Hx    Social History:  reports that he has been smoking cigarettes. He has a 11.88 pack-year smoking history. He has never used smokeless tobacco. He reports current alcohol use. He reports that he does not use drugs.  Allergies:  Allergies  Allergen Reactions   Other Itching, Swelling and Palpitations    Pecans: itching and swelling of the tongue    Diphenhydramine Itching, Palpitations and Other (See Comments)    "jittery" Pt states he can take  Benadryl in an emergency situation   Pecan Nut (Diagnostic)    Colchicine Diarrhea   Lipitor [Atorvastatin] Other (See Comments)    Muscle cramps   Septra [Sulfamethoxazole-Trimethoprim] Itching    Medications Prior to Admission  Medication Sig Dispense Refill   amLODipine (NORVASC) 10 MG tablet TAKE 1 TABLET(10 MG) BY MOUTH DAILY (Patient taking differently: Take 10 mg by mouth daily.) 30 tablet 0   ascorbic acid (VITAMIN C) 500 MG tablet Take 500 mg by mouth daily.     aspirin EC 81 MG tablet Take 81 mg by mouth daily.     citalopram (CELEXA) 40 MG tablet Take 1 tablet (40 mg total) by mouth daily. 90 tablet 1   diclofenac Sodium (VOLTAREN) 1 % GEL Apply 1 application topically 3 (three) times daily as needed  (PAIN).      fenofibrate (TRICOR) 145 MG tablet Take 1 tablet (145 mg total) by mouth daily. 90 tablet 1   fluticasone (FLONASE) 50 MCG/ACT nasal spray Place 2 sprays into both nostrils daily. (Patient taking differently: Place 2 sprays into both nostrils daily as needed for allergies.) 16 g 6   furosemide (LASIX) 40 MG tablet Take 1 tablet (40 mg total) by mouth daily. (Patient taking differently: Take 40 mg by mouth daily as needed for fluid.) 90 tablet 3   gabapentin (NEURONTIN) 300 MG capsule Take 900 mg by mouth 3 (three) times daily.     ibuprofen (ADVIL) 800 MG tablet Take 800 mg by mouth 3 (three) times daily.     lisinopril (ZESTRIL) 40 MG tablet TAKE ONE TABLET BY MOUTH DAILY (Patient taking differently: Take 40 mg by mouth daily.) 90 tablet 2   loratadine (CLARITIN) 10 MG tablet Take 1 tablet (10 mg total) by mouth daily as needed for allergies. 90 tablet 3   magnesium oxide (MAG-OX) 400 MG tablet Take 400 mg by mouth daily.     metFORMIN (GLUCOPHAGE-XR) 500 MG 24 hr tablet TAKE TWO TABLETS BY MOUTH DAILY WITH BREAKFAST (Patient taking differently: Take 1,000 mg by mouth daily with breakfast.) 180 tablet 3   Multiple Minerals-Vitamins (CALCIUM-MAGNESIUM-ZINC-D3) TABS Take 1 tablet by mouth daily.     oxyCODONE-acetaminophen (PERCOCET) 10-325 MG tablet Take 1 tablet by mouth every 6 (six) hours as needed for pain. 30 tablet 0   Potassium Chloride ER 20 MEQ TBCR TAKE ONE TABLET BY MOUTH DAILY (Patient taking differently: Take 20 mEq by mouth daily as needed (when taking furosemide).) 90 tablet 2   rosuvastatin (CRESTOR) 20 MG tablet TAKE 1 TABLET(20 MG) BY MOUTH DAILY (Patient taking differently: Take 20 mg by mouth daily.) 30 tablet 0   tiZANidine (ZANAFLEX) 4 MG tablet Take 4 mg by mouth 3 (three) times daily as needed for muscle spasms.     vitamin B-12 (CYANOCOBALAMIN) 1000 MCG tablet Take 1 tablet (1,000 mcg total) by mouth daily. 90 tablet 1   zinc gluconate 50 MG tablet Take 50 mg by  mouth daily.     ammonium lactate (AMLACTIN) 12 % lotion Apply 1 application topically as needed for dry skin. (Patient not taking: Reported on 09/03/2020) 400 g 3   Blood Glucose Monitoring Suppl (ONE TOUCH ULTRA 2) w/Device KIT Use as directed daily E11.9 1 each 0   clotrimazole-betamethasone (LOTRISONE) cream Use as directed twice daily as needed (Patient not taking: No sig reported) 15 g 1   fluconazole (DIFLUCAN) 100 MG tablet 1 tablet daily by mouth (Patient not taking: No sig reported) 7 tablet 0  glucose blood test strip Use as instructed once daily E119 100 each 12   Lancets MISC Use as directed daily E11.9 100 each 11   methocarbamol (ROBAXIN) 500 MG tablet Take 500 mg by mouth 4 (four) times daily as needed for muscle spasms.     sildenafil (VIAGRA) 100 MG tablet Take 0.5-1 tablets (50-100 mg total) by mouth daily as needed for erectile dysfunction. (Patient not taking: Reported on 09/03/2020) 5 tablet 11   terbinafine (LAMISIL) 250 MG tablet Take 1 tablet (250 mg total) by mouth daily. (Patient not taking: No sig reported) 90 tablet 1   tolterodine (DETROL) 2 MG tablet TAKE 1 TABLET TWICE DAILY (Patient not taking: No sig reported) 180 tablet 1    Results for orders placed or performed during the hospital encounter of 09/14/20 (from the past 48 hour(s))  Glucose, capillary     Status: Abnormal   Collection Time: 09/14/20  6:20 AM  Result Value Ref Range   Glucose-Capillary 130 (H) 70 - 99 mg/dL    Comment: Glucose reference range applies only to samples taken after fasting for at least 8 hours.   No results found.  Review of Systems  Musculoskeletal:  Positive for neck pain.  Neurological:  Positive for weakness and numbness.   Blood pressure (!) 146/93, pulse 79, temperature 98.4 F (36.9 C), temperature source Oral, resp. rate 18, height _0  (1.803 m), weight 113.9 kg, SpO2 96 %. Physical Exam HENT:     Head: Normocephalic.     Right Ear: Tympanic membrane normal.      Nose: Nose normal.     Mouth/Throat:     Mouth: Mucous membranes are moist.  Eyes:     Pupils: Pupils are equal, round, and reactive to light.  Cardiovascular:     Rate and Rhythm: Normal rate.  Pulmonary:     Effort: Pulmonary effort is normal.  Abdominal:     General: Abdomen is flat.  Musculoskeletal:        General: Normal range of motion.  Skin:    General: Skin is warm.  Neurological:     General: No focal deficit present.     Mental Status: He is alert.     Comments: Patient is awake and alert strength is 5 out of 5 deltoid, bicep, tricep, wrist extension, wrist flexion hand intrinsics are 4+ out of 5 lower extremity strength is 5 out of 5     Assessment/Plan 55 year old presents for ACDF C3-4  Elaina Hoops, MD 09/14/2020, 7:35 AM

## 2020-09-14 NOTE — Progress Notes (Signed)
OOB to w/c and then bathroom.

## 2020-09-14 NOTE — Progress Notes (Signed)
Orthopedic Tech Progress Note Patient Details:  Harold [name] 11-Apr-1965 [id number]  RN stated " patient has SOFT COLLAR"    Patient ID: HAAKON TITSWORTH, male   DOB: 29-May-1965, 55 y.o.   MRN: [id number]  Janit Pagan 09/14/2020, 1:42 PM

## 2020-09-14 NOTE — Evaluation (Signed)
Physical Therapy Evaluation and Discharge Patient Details Name: Harold [name] MRN: [id number] DOB: March 28, 1966 Today's Date: 09/14/2020   History of Present Illness  Pt is a 55 y.o. M who presents with severe cord compression C3-4 from a large disc herniation. S/p ACDF C3-4. Significant PMH: CHF, DM2, prior back surgery.  Clinical Impression  Patient evaluated by Physical Therapy with no further acute PT needs identified. Pt reports improvement in tingling symptoms post op. Ambulating x 200 feet with no assistive device and negotiated 10 steps with railings without physical difficulty. Education to pt/pt significant other regarding cervical precautions, brace use, activity recommendations, and car transfer technique. All education has been completed and the patient has no further questions. No follow-up Physical Therapy or equipment needs. PT is signing off. Thank you for this referral.      Follow Up Recommendations No PT follow up    Equipment Recommendations  None recommended by PT    Recommendations for Other Services       Precautions / Restrictions Precautions Precautions: Cervical Precaution Booklet Issued: Yes (comment) Precaution Comments: Provided written handout, verbally reviewed Required Braces or Orthoses: Cervical Brace Cervical Brace: Soft collar (apply while sitting) Restrictions Weight Bearing Restrictions: No      Mobility  Bed Mobility               General bed mobility comments: Sitting EOB upon entrance    Transfers Overall transfer level: Independent Equipment used: None                Ambulation/Gait Ambulation/Gait assistance: Supervision Gait Distance (Feet): 200 Feet Assistive device: None Gait Pattern/deviations: WFL(Within Functional Limits)     General Gait Details: Supervision for safety, no gross instability noted  Stairs Stairs: Yes Stairs assistance: Supervision Stair Management: Two rails Number of Stairs:  10 General stair comments: Cues for step by step  Wheelchair Mobility    Modified Rankin (Stroke Patients Only)       Balance Overall balance assessment: Mild deficits observed, not formally tested                                           Pertinent Vitals/Pain Pain Assessment: Faces Faces Pain Scale: Hurts even more Pain Location: surgical site, RUE Pain Descriptors / Indicators: Burning;Operative site guarding;Grimacing Pain Intervention(s): Limited activity within patient's tolerance;Monitored during session;Premedicated before session;Patient requesting pain meds-RN notified    Home Living Family/patient expects to be discharged to:: Private residence Living Arrangements: Spouse/significant other;Children Available Help at Discharge: Family;Available 24 hours/day Type of Home: House Home Access: Stairs to enter Entrance Stairs-Rails: Right;Left;Can reach both Entrance Stairs-Number of Steps: 3 Home Layout: Two level;Able to live on main level with bedroom/bathroom Home Equipment: Kasandra Knudsen - single point;Walker - 2 wheels;Electric scooter;Other (comment) Additional Comments: reacher    Prior Function Level of Independence: Needs assistance   Gait / Transfers Assistance Needed: Assist for stair negotiation  ADL's / Homemaking Assistance Needed: Intermittent assist for [facility] ADL's, IADL's. Does not work        Journalist, newspaper   Dominant Hand: Right    Extremity/Trunk Assessment   Upper Extremity Assessment Upper Extremity Assessment: RUE deficits/detail;LUE deficits/detail RUE Deficits / Details: Strength 5/5 LUE Deficits / Details: Strength 5/5    Lower Extremity Assessment Lower Extremity Assessment: Overall WFL for tasks assessed    Cervical / Trunk Assessment Cervical / Trunk Assessment: Other  exceptions Cervical / Trunk Exceptions: s/p ACDF  Communication   Communication: No difficulties  Cognition Arousal/Alertness:  Awake/alert Behavior During Therapy: WFL for tasks assessed/performed Overall Cognitive Status: Within Functional Limits for tasks assessed                                 General Comments: Drowsy, but appropriate      General Comments      Exercises     Assessment/Plan    PT Assessment Patent does not need any further PT services  PT Problem List Pain;Decreased balance       PT Treatment Interventions      PT Goals (Current goals can be found in the Care Plan section)  Acute Rehab PT Goals Patient Stated Goal: go home PT Goal Formulation: All assessment and education complete, DC therapy    Frequency     Barriers to discharge        Co-evaluation               AM-PAC PT "6 Clicks" Mobility  Outcome Measure Help needed turning from your back to your side while in a flat bed without using bedrails?: None Help needed moving from lying on your back to sitting on the side of a flat bed without using bedrails?: None Help needed moving to and from a bed to a chair (including a wheelchair)?: A Little Help needed standing up from a chair using your arms (e.g., wheelchair or bedside chair)?: None Help needed to walk in hospital room?: A Little Help needed climbing 3-5 steps with a railing? : A Little 6 Click Score: 21    End of Session Equipment Utilized During Treatment: Cervical collar Activity Tolerance: Patient tolerated treatment well Patient left: in bed;with call bell/phone within reach;with family/visitor present Nurse Communication: Mobility status PT Visit Diagnosis: Pain Pain - part of body:  (neck)    Time: 7017-7939 PT Time Calculation (min) (ACUTE ONLY): 22 min   Charges:   PT Evaluation $PT Eval Low Complexity: 1 Low          Wyona Almas, PT, DPT Acute Rehabilitation Services Pager [phone] Office [phone]   Deno Etienne 09/14/2020, 3:16 PM

## 2020-09-14 NOTE — Plan of Care (Signed)
Adequately Ready for discharge

## 2020-09-14 NOTE — Anesthesia Procedure Notes (Signed)
Procedure Name: Intubation Date/Time: 09/14/2020 7:49 AM Performed by: Lowella Dell, CRNA Pre-anesthesia Checklist: Patient identified, Emergency Drugs available, Suction available, Patient being monitored and Timeout performed Patient Re-evaluated:Patient Re-evaluated prior to induction Oxygen Delivery Method: Circle system utilized Preoxygenation: Pre-oxygenation with 100% oxygen Induction Type: IV induction Ventilation: Two handed mask ventilation required and Oral airway inserted - appropriate to patient size Laryngoscope Size: Mac and 4 Grade View: Grade I Tube type: Oral Tube size: 7.5 mm Number of attempts: 1 Airway Equipment and Method: Stylet Placement Confirmation: ETT inserted through vocal cords under direct vision, positive ETCO2 and breath sounds checked- equal and bilateral Secured at: 22 cm Dental Injury: Teeth and Oropharynx as per pre-operative assessment

## 2020-09-15 ENCOUNTER — Encounter (HOSPITAL_COMMUNITY): Payer: Self-pay | Admitting: Neurosurgery

## 2020-09-16 DIAGNOSIS — M545 Low back pain, unspecified: Secondary | ICD-10-CM | POA: Diagnosis not present

## 2020-09-16 DIAGNOSIS — G894 Chronic pain syndrome: Secondary | ICD-10-CM | POA: Diagnosis not present

## 2020-09-16 DIAGNOSIS — R2689 Other abnormalities of gait and mobility: Secondary | ICD-10-CM | POA: Diagnosis not present

## 2020-09-16 DIAGNOSIS — M961 Postlaminectomy syndrome, not elsewhere classified: Secondary | ICD-10-CM | POA: Diagnosis not present

## 2020-09-16 DIAGNOSIS — G9009 Other idiopathic peripheral autonomic neuropathy: Secondary | ICD-10-CM | POA: Diagnosis not present

## 2020-09-27 ENCOUNTER — Emergency Department (HOSPITAL_COMMUNITY): Payer: Medicare HMO

## 2020-09-27 ENCOUNTER — Other Ambulatory Visit: Payer: Self-pay

## 2020-09-27 ENCOUNTER — Inpatient Hospital Stay (HOSPITAL_COMMUNITY)
Admission: EM | Admit: 2020-09-27 | Discharge: 2020-09-30 | DRG: 093 | Disposition: A | Discharge status: Rehabilitation Facility | Payer: Medicare HMO | Attending: Neurosurgery | Admitting: Neurosurgery

## 2020-09-27 ENCOUNTER — Encounter (HOSPITAL_COMMUNITY): Payer: Self-pay

## 2020-09-27 DIAGNOSIS — M7989 Other specified soft tissue disorders: Secondary | ICD-10-CM | POA: Diagnosis not present

## 2020-09-27 DIAGNOSIS — M545 Low back pain, unspecified: Secondary | ICD-10-CM | POA: Diagnosis not present

## 2020-09-27 DIAGNOSIS — N529 Male erectile dysfunction, unspecified: Secondary | ICD-10-CM | POA: Diagnosis present

## 2020-09-27 DIAGNOSIS — Z833 Family history of diabetes mellitus: Secondary | ICD-10-CM

## 2020-09-27 DIAGNOSIS — E8881 Metabolic syndrome: Secondary | ICD-10-CM | POA: Diagnosis not present

## 2020-09-27 DIAGNOSIS — I509 Heart failure, unspecified: Secondary | ICD-10-CM | POA: Diagnosis not present

## 2020-09-27 DIAGNOSIS — R0902 Hypoxemia: Secondary | ICD-10-CM | POA: Diagnosis not present

## 2020-09-27 DIAGNOSIS — K219 Gastro-esophageal reflux disease without esophagitis: Secondary | ICD-10-CM | POA: Diagnosis present

## 2020-09-27 DIAGNOSIS — D72829 Elevated white blood cell count, unspecified: Secondary | ICD-10-CM | POA: Diagnosis not present

## 2020-09-27 DIAGNOSIS — I110 Hypertensive heart disease with heart failure: Secondary | ICD-10-CM | POA: Diagnosis present

## 2020-09-27 DIAGNOSIS — G9589 Other specified diseases of spinal cord: Secondary | ICD-10-CM | POA: Diagnosis not present

## 2020-09-27 DIAGNOSIS — E1140 Type 2 diabetes mellitus with diabetic neuropathy, unspecified: Secondary | ICD-10-CM | POA: Diagnosis not present

## 2020-09-27 DIAGNOSIS — Z888 Allergy status to other drugs, medicaments and biological substances status: Secondary | ICD-10-CM | POA: Diagnosis not present

## 2020-09-27 DIAGNOSIS — Z91018 Allergy to other foods: Secondary | ICD-10-CM | POA: Diagnosis not present

## 2020-09-27 DIAGNOSIS — G2581 Restless legs syndrome: Secondary | ICD-10-CM | POA: Diagnosis present

## 2020-09-27 DIAGNOSIS — F32A Depression, unspecified: Secondary | ICD-10-CM | POA: Diagnosis not present

## 2020-09-27 DIAGNOSIS — G959 Disease of spinal cord, unspecified: Secondary | ICD-10-CM | POA: Diagnosis present

## 2020-09-27 DIAGNOSIS — F17210 Nicotine dependence, cigarettes, uncomplicated: Secondary | ICD-10-CM | POA: Diagnosis present

## 2020-09-27 DIAGNOSIS — Z981 Arthrodesis status: Secondary | ICD-10-CM

## 2020-09-27 DIAGNOSIS — Z8249 Family history of ischemic heart disease and other diseases of the circulatory system: Secondary | ICD-10-CM | POA: Diagnosis not present

## 2020-09-27 DIAGNOSIS — R531 Weakness: Secondary | ICD-10-CM | POA: Diagnosis not present

## 2020-09-27 DIAGNOSIS — G4733 Obstructive sleep apnea (adult) (pediatric): Secondary | ICD-10-CM | POA: Diagnosis present

## 2020-09-27 DIAGNOSIS — R001 Bradycardia, unspecified: Secondary | ICD-10-CM | POA: Diagnosis not present

## 2020-09-27 DIAGNOSIS — W19XXXA Unspecified fall, initial encounter: Secondary | ICD-10-CM | POA: Diagnosis not present

## 2020-09-27 DIAGNOSIS — M5000 Cervical disc disorder with myelopathy, unspecified cervical region: Secondary | ICD-10-CM | POA: Diagnosis not present

## 2020-09-27 DIAGNOSIS — E781 Pure hyperglyceridemia: Secondary | ICD-10-CM | POA: Diagnosis not present

## 2020-09-27 DIAGNOSIS — E876 Hypokalemia: Secondary | ICD-10-CM | POA: Diagnosis not present

## 2020-09-27 DIAGNOSIS — M4322 Fusion of spine, cervical region: Secondary | ICD-10-CM | POA: Diagnosis not present

## 2020-09-27 DIAGNOSIS — Z881 Allergy status to other antibiotic agents status: Secondary | ICD-10-CM | POA: Diagnosis not present

## 2020-09-27 DIAGNOSIS — R29898 Other symptoms and signs involving the musculoskeletal system: Secondary | ICD-10-CM | POA: Diagnosis present

## 2020-09-27 DIAGNOSIS — R202 Paresthesia of skin: Secondary | ICD-10-CM | POA: Diagnosis not present

## 2020-09-27 DIAGNOSIS — R609 Edema, unspecified: Secondary | ICD-10-CM | POA: Diagnosis not present

## 2020-09-27 DIAGNOSIS — M6281 Muscle weakness (generalized): Secondary | ICD-10-CM | POA: Diagnosis not present

## 2020-09-27 DIAGNOSIS — I10 Essential (primary) hypertension: Secondary | ICD-10-CM | POA: Diagnosis not present

## 2020-09-27 DIAGNOSIS — M5003 Cervical disc disorder with myelopathy, cervicothoracic region: Secondary | ICD-10-CM | POA: Diagnosis not present

## 2020-09-27 DIAGNOSIS — Z20822 Contact with and (suspected) exposure to covid-19: Secondary | ICD-10-CM | POA: Diagnosis not present

## 2020-09-27 DIAGNOSIS — G894 Chronic pain syndrome: Secondary | ICD-10-CM | POA: Diagnosis present

## 2020-09-27 DIAGNOSIS — Z4789 Encounter for other orthopedic aftercare: Secondary | ICD-10-CM | POA: Diagnosis not present

## 2020-09-27 DIAGNOSIS — Z9889 Other specified postprocedural states: Secondary | ICD-10-CM | POA: Diagnosis not present

## 2020-09-27 DIAGNOSIS — E785 Hyperlipidemia, unspecified: Secondary | ICD-10-CM | POA: Diagnosis present

## 2020-09-27 DIAGNOSIS — F419 Anxiety disorder, unspecified: Secondary | ICD-10-CM | POA: Diagnosis not present

## 2020-09-27 DIAGNOSIS — Z01818 Encounter for other preprocedural examination: Secondary | ICD-10-CM | POA: Diagnosis not present

## 2020-09-27 LAB — COMPREHENSIVE METABOLIC PANEL
ALT: 15 U/L (ref 0–44)
AST: 21 U/L (ref 15–41)
Albumin: 3.9 g/dL (ref 3.5–5.0)
Alkaline Phosphatase: 51 U/L (ref 38–126)
Anion gap: 11 (ref 5–15)
BUN: 9 mg/dL (ref 6–20)
CO2: 26 mmol/L (ref 22–32)
Calcium: 9.2 mg/dL (ref 8.9–10.3)
Chloride: 101 mmol/L (ref 98–111)
Creatinine, Ser: 0.89 mg/dL (ref 0.61–1.24)
GFR, Estimated: >60 mL/min (ref >60)
Glucose, Bld: 137 mg/dL — ABNORMAL HIGH (ref 70–99)
Potassium: 3.4 mmol/L — ABNORMAL LOW (ref 3.5–5.1)
Sodium: 138 mmol/L (ref 135–145)
Total Bilirubin: 0.6 mg/dL (ref 0.3–1.2)
Total Protein: 7 g/dL (ref 6.5–8.1)

## 2020-09-27 LAB — SEDIMENTATION RATE: Sed Rate: 10 mm/hr (ref 0–16)

## 2020-09-27 LAB — CBC WITH DIFFERENTIAL/PLATELET
Abs Immature Granulocytes: 0.04 10*3/uL (ref 0.00–0.07)
Basophils Absolute: 0.1 10*3/uL (ref 0.0–0.1)
Basophils Relative: 1 %
Eosinophils Absolute: 0.4 10*3/uL (ref 0.0–0.5)
Eosinophils Relative: 3 %
HCT: 43.6 % (ref 39.0–52.0)
Hemoglobin: 14.3 g/dL (ref 13.0–17.0)
Immature Granulocytes: 0 %
Lymphocytes Relative: 19 %
Lymphs Abs: 2.2 10*3/uL (ref 0.7–4.0)
MCH: 31.2 pg (ref 26.0–34.0)
MCHC: 32.8 g/dL (ref 30.0–36.0)
MCV: 95 fL (ref 80.0–100.0)
Monocytes Absolute: 1.1 10*3/uL — ABNORMAL HIGH (ref 0.1–1.0)
Monocytes Relative: 9 %
Neutro Abs: 8.3 10*3/uL — ABNORMAL HIGH (ref 1.7–7.7)
Neutrophils Relative %: 68 %
Platelets: 508 10*3/uL — ABNORMAL HIGH (ref 150–400)
RBC: 4.59 MIL/uL (ref 4.22–5.81)
RDW: 12.7 % (ref 11.5–15.5)
WBC: 12.1 10*3/uL — ABNORMAL HIGH (ref 4.0–10.5)
nRBC: 0 % (ref 0.0–0.2)

## 2020-09-27 LAB — RESP PANEL BY RT-PCR (FLU A&B, COVID) ARPGX2
Influenza A by PCR: NEGATIVE
Influenza B by PCR: NEGATIVE
SARS Coronavirus 2 by RT PCR: NEGATIVE

## 2020-09-27 LAB — BRAIN NATRIURETIC PEPTIDE: B Natriuretic Peptide: 78.3 pg/mL (ref 0.0–100.0)

## 2020-09-27 LAB — GLUCOSE, CAPILLARY: Glucose-Capillary: 128 mg/dL — ABNORMAL HIGH (ref 70–99)

## 2020-09-27 LAB — TROPONIN I (HIGH SENSITIVITY)
Troponin I (High Sensitivity): 9 ng/L (ref <18)
Troponin I (High Sensitivity): 8 ng/L (ref <18)

## 2020-09-27 LAB — C-REACTIVE PROTEIN: CRP: 0.5 mg/dL (ref <1.0)

## 2020-09-27 LAB — PROCALCITONIN: Procalcitonin: <0.1 ng/mL

## 2020-09-27 MED ORDER — DEXAMETHASONE 4 MG PO TABS
4.0000 mg | ORAL_TABLET | Freq: Three times a day (TID) | ORAL | Status: DC
  Administered 2020-09-28 – 2020-09-29 (×5): 4 mg via ORAL
  Filled 2020-09-27 (×8): qty 1

## 2020-09-27 MED ORDER — SODIUM CHLORIDE 0.9 % IV SOLN: 250.0000 mL | INTRAVENOUS | Status: DC

## 2020-09-27 MED ORDER — OXYCODONE-ACETAMINOPHEN 10-325 MG PO TABS: 1.0000 | ORAL_TABLET | Freq: Four times a day (QID) | ORAL | Status: DC | PRN

## 2020-09-27 MED ORDER — ONDANSETRON HCL 4 MG PO TABS: 4.0000 mg | ORAL_TABLET | Freq: Four times a day (QID) | ORAL | Status: DC | PRN

## 2020-09-27 MED ORDER — SILDENAFIL CITRATE 100 MG PO TABS: 50.0000 mg | ORAL_TABLET | Freq: Every day | ORAL | Status: DC | PRN

## 2020-09-27 MED ORDER — FUROSEMIDE 20 MG PO TABS
40.0000 mg | ORAL_TABLET | Freq: Every day | ORAL | Status: DC
  Administered 2020-09-30: 40 mg via ORAL
  Filled 2020-09-27 (×2): qty 2

## 2020-09-27 MED ORDER — AMLODIPINE BESYLATE 5 MG PO TABS
10.0000 mg | ORAL_TABLET | Freq: Every day | ORAL | Status: DC
  Administered 2020-09-28 – 2020-09-30 (×3): 10 mg via ORAL
  Filled 2020-09-27 (×3): qty 2

## 2020-09-27 MED ORDER — GABAPENTIN 300 MG PO CAPS
900.0000 mg | ORAL_CAPSULE | Freq: Three times a day (TID) | ORAL | Status: DC
  Administered 2020-09-27 – 2020-09-30 (×8): 900 mg via ORAL
  Filled 2020-09-27 (×8): qty 3

## 2020-09-27 MED ORDER — ONDANSETRON HCL 4 MG/2ML IJ SOLN
4.0000 mg | Freq: Once | INTRAMUSCULAR | Status: AC
  Administered 2020-09-27: 4 mg via INTRAVENOUS
  Filled 2020-09-27: qty 2

## 2020-09-27 MED ORDER — FLUTICASONE PROPIONATE 50 MCG/ACT NA SUSP
2.0000 | Freq: Every day | NASAL | Status: DC
  Filled 2020-09-27 (×2): qty 16

## 2020-09-27 MED ORDER — HYDROMORPHONE HCL 1 MG/ML IJ SOLN
1.0000 mg | Freq: Once | INTRAMUSCULAR | Status: AC
  Administered 2020-09-27: 1 mg via INTRAVENOUS
  Filled 2020-09-27: qty 1

## 2020-09-27 MED ORDER — OXYCODONE HCL 5 MG PO TABS
5.0000 mg | ORAL_TABLET | Freq: Four times a day (QID) | ORAL | Status: DC | PRN
  Administered 2020-09-27 – 2020-09-30 (×7): 5 mg via ORAL
  Filled 2020-09-27 (×8): qty 1

## 2020-09-27 MED ORDER — ADULT MULTIVITAMIN W/MINERALS CH
1.0000 | ORAL_TABLET | Freq: Every day | ORAL | Status: DC
  Administered 2020-09-28 – 2020-09-30 (×3): 1 via ORAL
  Filled 2020-09-27 (×3): qty 1

## 2020-09-27 MED ORDER — FENOFIBRATE 160 MG PO TABS
160.0000 mg | ORAL_TABLET | Freq: Every day | ORAL | Status: DC
  Administered 2020-09-28 – 2020-09-30 (×3): 160 mg via ORAL
  Filled 2020-09-27 (×3): qty 1

## 2020-09-27 MED ORDER — TIZANIDINE HCL 4 MG PO TABS: 4.0000 mg | ORAL_TABLET | Freq: Three times a day (TID) | ORAL | Status: DC | PRN

## 2020-09-27 MED ORDER — IOHEXOL 300 MG/ML  SOLN
100.0000 mL | Freq: Once | INTRAMUSCULAR | Status: AC | PRN
  Administered 2020-09-27: 100 mL via INTRAVENOUS

## 2020-09-27 MED ORDER — ACETAMINOPHEN 650 MG RE SUPP
650.0000 mg | RECTAL | Status: DC | PRN
Start: 2020-09-27 — End: 2020-09-30

## 2020-09-27 MED ORDER — METFORMIN HCL ER 500 MG PO TB24
1000.0000 mg | ORAL_TABLET | Freq: Every day | ORAL | Status: DC
  Administered 2020-09-30: 1000 mg via ORAL
  Filled 2020-09-27 (×2): qty 2

## 2020-09-27 MED ORDER — POLYETHYLENE GLYCOL 3350 17 G PO PACK: 17.0000 g | PACK | Freq: Every day | ORAL | Status: DC | PRN

## 2020-09-27 MED ORDER — LISINOPRIL 20 MG PO TABS: 40.0000 mg | ORAL_TABLET | Freq: Every day | ORAL | Status: DC

## 2020-09-27 MED ORDER — ASCORBIC ACID 500 MG PO TABS
500.0000 mg | ORAL_TABLET | Freq: Every day | ORAL | Status: DC
  Administered 2020-09-28 – 2020-09-30 (×3): 500 mg via ORAL
  Filled 2020-09-27 (×3): qty 1

## 2020-09-27 MED ORDER — MENTHOL 3 MG MT LOZG: 1.0000 | LOZENGE | OROMUCOSAL | Status: DC | PRN

## 2020-09-27 MED ORDER — INSULIN ASPART 100 UNIT/ML IJ SOLN
0.0000 [IU] | Freq: Every day | INTRAMUSCULAR | Status: DC
  Administered 2020-09-28: 2 [IU] via SUBCUTANEOUS

## 2020-09-27 MED ORDER — INSULIN ASPART 100 UNIT/ML IJ SOLN
0.0000 [IU] | Freq: Three times a day (TID) | INTRAMUSCULAR | Status: DC
Start: 2020-09-28 — End: 2020-09-30
  Administered 2020-09-28 (×2): 4 [IU] via SUBCUTANEOUS
  Administered 2020-09-29: 11 [IU] via SUBCUTANEOUS
  Administered 2020-09-29 (×2): 4 [IU] via SUBCUTANEOUS
  Administered 2020-09-30: 3 [IU] via SUBCUTANEOUS
  Administered 2020-09-30: 4 [IU] via SUBCUTANEOUS

## 2020-09-27 MED ORDER — METHOCARBAMOL 500 MG PO TABS
500.0000 mg | ORAL_TABLET | Freq: Four times a day (QID) | ORAL | Status: DC | PRN
  Administered 2020-09-27 – 2020-09-29 (×3): 500 mg via ORAL
  Filled 2020-09-27 (×4): qty 1

## 2020-09-27 MED ORDER — OXYCODONE-ACETAMINOPHEN 5-325 MG PO TABS
1.0000 | ORAL_TABLET | Freq: Four times a day (QID) | ORAL | Status: DC | PRN
  Administered 2020-09-28 – 2020-09-30 (×8): 1 via ORAL
  Filled 2020-09-27 (×8): qty 1

## 2020-09-27 MED ORDER — SODIUM CHLORIDE 0.9% FLUSH
3.0000 mL | Freq: Two times a day (BID) | INTRAVENOUS | Status: DC
  Administered 2020-09-27 – 2020-09-30 (×6): 3 mL via INTRAVENOUS

## 2020-09-27 MED ORDER — ZINC SULFATE 220 (50 ZN) MG PO CAPS
220.0000 mg | ORAL_CAPSULE | Freq: Every day | ORAL | Status: DC
  Administered 2020-09-28 – 2020-09-30 (×3): 220 mg via ORAL
  Filled 2020-09-27 (×3): qty 1

## 2020-09-27 MED ORDER — GADOBUTROL 1 MMOL/ML IV SOLN
10.0000 mL | Freq: Once | INTRAVENOUS | Status: AC | PRN
  Administered 2020-09-27: 10 mL via INTRAVENOUS

## 2020-09-27 MED ORDER — ACETAMINOPHEN 325 MG PO TABS
650.0000 mg | ORAL_TABLET | ORAL | Status: DC | PRN
  Administered 2020-09-28: 650 mg via ORAL
  Filled 2020-09-27: qty 2

## 2020-09-27 MED ORDER — ONDANSETRON HCL 4 MG/2ML IJ SOLN
4.0000 mg | Freq: Four times a day (QID) | INTRAMUSCULAR | Status: DC | PRN
  Administered 2020-09-27: 4 mg via INTRAVENOUS
  Filled 2020-09-27: qty 2

## 2020-09-27 MED ORDER — MAGNESIUM OXIDE -MG SUPPLEMENT 400 (240 MG) MG PO TABS
400.0000 mg | ORAL_TABLET | Freq: Every day | ORAL | Status: DC
  Administered 2020-09-28 – 2020-09-30 (×3): 400 mg via ORAL
  Filled 2020-09-27 (×3): qty 1

## 2020-09-27 MED ORDER — POTASSIUM CHLORIDE CRYS ER 20 MEQ PO TBCR
20.0000 meq | EXTENDED_RELEASE_TABLET | Freq: Every day | ORAL | Status: DC
  Administered 2020-09-27 – 2020-09-30 (×2): 20 meq via ORAL
  Filled 2020-09-27 (×3): qty 1

## 2020-09-27 MED ORDER — SODIUM CHLORIDE 0.9 % IV SOLN: INTRAVENOUS | Status: DC

## 2020-09-27 MED ORDER — PHENOL 1.4 % MT LIQD: 1.0000 | OROMUCOSAL | Status: DC | PRN

## 2020-09-27 MED ORDER — CITALOPRAM HYDROBROMIDE 20 MG PO TABS
40.0000 mg | ORAL_TABLET | Freq: Every day | ORAL | Status: DC
  Administered 2020-09-28 – 2020-09-30 (×3): 40 mg via ORAL
  Filled 2020-09-27 (×2): qty 2
  Filled 2020-09-27: qty 4
  Filled 2020-09-27: qty 2

## 2020-09-27 MED ORDER — FLEET ENEMA 7-19 GM/118ML RE ENEM: 1.0000 | ENEMA | Freq: Once | RECTAL | Status: DC | PRN

## 2020-09-27 MED ORDER — VITAMIN B-12 1000 MCG PO TABS
1000.0000 ug | ORAL_TABLET | Freq: Every day | ORAL | Status: DC
  Administered 2020-09-28 – 2020-09-30 (×3): 1000 ug via ORAL
  Filled 2020-09-27 (×3): qty 1

## 2020-09-27 MED ORDER — ROSUVASTATIN CALCIUM 20 MG PO TABS
20.0000 mg | ORAL_TABLET | Freq: Every day | ORAL | Status: DC
  Administered 2020-09-28 – 2020-09-30 (×3): 20 mg via ORAL
  Filled 2020-09-27 (×3): qty 1

## 2020-09-27 MED ORDER — LORATADINE 10 MG PO TABS: 10.0000 mg | ORAL_TABLET | Freq: Every day | ORAL | Status: DC | PRN

## 2020-09-27 MED ORDER — HYDROMORPHONE HCL 1 MG/ML IJ SOLN
0.5000 mg | INTRAMUSCULAR | Status: DC | PRN
  Administered 2020-09-27 – 2020-09-30 (×8): 0.5 mg via INTRAVENOUS
  Filled 2020-09-27 (×10): qty 0.5

## 2020-09-27 MED ORDER — DOCUSATE SODIUM 100 MG PO CAPS
100.0000 mg | ORAL_CAPSULE | Freq: Two times a day (BID) | ORAL | Status: DC
  Administered 2020-09-28 – 2020-09-30 (×5): 100 mg via ORAL
  Filled 2020-09-27 (×6): qty 1

## 2020-09-27 MED ORDER — DEXAMETHASONE SODIUM PHOSPHATE 4 MG/ML IJ SOLN: 4.0000 mg | Freq: Once | INTRAMUSCULAR | Status: DC

## 2020-09-27 MED ORDER — ASPIRIN EC 81 MG PO TBEC
81.0000 mg | DELAYED_RELEASE_TABLET | Freq: Every day | ORAL | Status: DC
  Administered 2020-09-28 – 2020-09-30 (×3): 81 mg via ORAL
  Filled 2020-09-27 (×3): qty 1

## 2020-09-27 MED ORDER — SODIUM CHLORIDE 0.9% FLUSH: 3.0000 mL | INTRAVENOUS | Status: DC | PRN

## 2020-09-27 NOTE — ED Provider Notes (Signed)
Emergency Medicine Provider Triage Evaluation Note  Harold [name] , a 55 y.o. male  was evaluated in triage.  Pt complains of bilateral arm/hand tingling and swelling to bilateral hands.  Patient reports that he had surgery on his cervical spine 6/20.  Since then he has had worsening tingling sensation to bilateral arms.  Patient reports that over the last few days he has developed swelling to bilateral hands.  Patient reports a history of CHF.  Has been taking his Lasix as prescribed.  Patient states that he has had 3-5 falls yesterday due to his bilateral arm tingling keeping him from holding onto things.  Patient denies hitting his head or any loss of consciousness.  Patient endorses chronic low back pain.  Patient denies any saddle anesthesia, bowel or bladder dysfunction, hitting his head or any loss of consciousness  Review of Systems  Positive: Tingling sensation bilateral arms, swelling bilateral hands Negative: Chest pain, shortness of breath, saddle anesthesia, bowel or bladder dysfunction, fevers, chills  Physical Exam  There were no vitals taken for this visit. Gen:   Awake, no distress   Resp:  Normal effort  MSK:   Moves extremities without difficulty  Other:  Grip strength equal, 4/5 strength to bilateral upper extremities, no pronator drift, sensation to light touch intact to bilateral upper extremities.  Cervical spine is supple with no midline tenderness.  Surgical wounds clean, dry, and intact no erythema or purulent discharge noted  Medical Decision Making  Medically screening exam initiated at 8:10 AM.  Appropriate orders placed.  Harold [name] was informed that the remainder of the evaluation will be completed by another provider, this initial triage assessment does not replace that evaluation, and the importance of remaining in the ED until their evaluation is complete.  The patient appears stable so that the remainder of the work up may be completed by another provider.       Loni Beckwith, PA-C 09/27/20 2111    Varney Biles, MD 09/28/20 1120

## 2020-09-27 NOTE — ED Notes (Signed)
Neurosurgery called

## 2020-09-27 NOTE — H&P (Addendum)
CC: recent ACDF  HPI:     Patient is a 55 y.o. male with DM2 and history of lumbar spine surgery who is status post recent C3-4 ACDF on 09/14/2020 with Dr. Kary Kos for severe cord impingement with myelomalacia.  Since the surgery, patient states his symptoms are actually worse than preoperatively and over the past week, may have slightly worsened as well.  No fevers, chills, or significant swallowing difficulty.  No drainage from his neck incision.    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   Urinary frequency 07/24/2015   Trichomonas exposure 07/24/2015   Type 2 diabetes mellitus ([place]) 03/04/2015   Verruca vulgaris 02/24/2015   Nocturia 02/17/2015   Accessory skin tags 02/17/2015   Acute sinusitis 06/04/2014   Lumbar radiculopathy, acute 05/23/2014   Acute left-sided back pain with sciatica 04/10/2014   Smoker 04/10/2014   Cough 01/01/2014   Grief reaction 11/19/2013   Primary localized osteoarthrosis, lower leg 09/04/2013   Chronic pain syndrome 07/11/2013   Hyperlipidemia 07/11/2013   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/09/2013   Gonalgia 11/29/2012   Encounter for well adult exam with abnormal findings 09/14/2012   DDD (degenerative disc disease), lumbar 09/14/2012   Left sided sciatica 09/14/2012   Erectile dysfunction 09/14/2012   Pre-ulcerative corn or callous 09/14/2012   Hemorrhoids 07/26/2011   Chronic back pain  greater than 3 months duration 09/23/2010   Abnormal EKG 08/12/2010   OSA (obstructive sleep apnea) 08/09/2010   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   CHF (congestive heart failure) ([place]) 06/29/2010   Edema 06/10/2010   Acute idiopathic pericarditis 04/08/2010   NONSPECIFIC ABNORMAL ELECTROCARDIOGRAM 04/08/2010   HYPERTRIGLYCERIDEMIA 05/26/2009   SHOULDER PAIN, LEFT 04/21/2008   Depression 03/11/2008   Essential hypertension 03/11/2008   Gout, unspecified 25/00/3704   METABOLIC SYNDROME X 88/89/1694   OBESITY NOS 10/24/2006   Past Medical History:  Diagnosis Date   Acute idiopathic pericarditis 04/08/2010   Qualifier: Diagnosis of  By: [name], [name]     Acute sinusitis 06/04/2014   Anxiety    CHF (congestive heart failure) ([place]) 06/29/2010   was pericardititis not CHF   Chronic back pain    Constipation    DDD (degenerative disc disease), lumbar 09/14/2012   Depression    Erectile dysfunction 09/14/2012   GERD (gastroesophageal reflux disease)    ocassional   Gout, unspecified 05/01/2007   Qualifier: Diagnosis of  By: [name] MD, Dellis Filbert A    Headache    migraines in the past   History of pancreatitis    a. admx 04-2009.Marland KitchenMarland Kitchen? 2-2 triglycerides   Hypertension    Hypertriglyceridemia    a. followed by [facility]   Left sided sciatica 09/14/2012   Neuropathy    Obesity    OSA (obstructive sleep apnea)    CPAP- not current   Restless legs    Type 2 diabetes mellitus with diabetic neuropathy, unspecified ([place]) 09/20/2019    Past Surgical History:  Procedure Laterality Date  ANTERIOR CERVICAL DECOMP/DISCECTOMY FUSION N/A 09/14/2020   Procedure: Anterior Cervical Decompression Fusion - Cervical three-Cervical four;  Surgeon: Kary Kos, MD;  Location: [facility];  Service: Neurosurgery;  Laterality: N/A;   COLONOSCOPY     KNEE SURGERY Right    x3 scopes   KNEE SURGERY Right    open menisicus repair   LUMBAR LAMINECTOMY/DECOMPRESSION MICRODISCECTOMY N/A 08/26/2015   Procedure:  Lumbar Two-Sacral One  Laminectomy for decompression;  Surgeon: [name], MD;  Location: [facility];  Service: Neurosurgery;  Laterality: N/A;   pericardectomy  07/15/2010   Dr. [name]   pleurx catheter placement  07/15/2010   Dr. [name]   SHOULDER SURGERY     right and left shoulders- Rotator cuff repair   TONSILLECTOMY AND ADENOIDECTOMY     one tonsil    (Not in a hospital admission)  Allergies  Allergen Reactions   Other Itching, Swelling and Palpitations    Pecans: itching and swelling of the tongue    Diphenhydramine Itching, Palpitations and Other (See Comments)    "jittery" Pt states he can take Benadryl in an emergency situation   Pecan Nut (Diagnostic)    Colchicine Diarrhea   Lipitor [Atorvastatin] Other (See Comments)    Muscle cramps   Septra [Sulfamethoxazole-Trimethoprim] Itching    Social History   Tobacco Use   Smoking status: Every Day    Packs/day: 0.33    Years: 36.00    Pack years: 11.88    Types: Cigarettes   Smokeless tobacco: Never   Tobacco comments:    cigar - 1 a week  Substance Use Topics   Alcohol use: Yes    Comment: Maybe 2 times a month - 6 ounces each time    Family History  Problem Relation Age of Onset   Diabetes Mother    Diabetes Father    Colon cancer Father 82   Heart disease Father 78       CABG   Pancreatitis Father    Hypertension Other        entire family   Liver cancer Paternal Uncle    Diabetes Sister    Stomach cancer Maternal Grandfather    Pancreatitis Paternal Uncle        x 2   Esophageal cancer Neg Hx    Rectal cancer Neg Hx    Colon polyps Neg Hx      Review of Systems Pertinent items are noted in HPI.  Objective:   Patient Vitals for the past 8 hrs:  BP Pulse Resp SpO2  09/27/20 1700 133/78 60 -- 90 %  09/27/20 1615 (!) 148/84 (!) 53 16 99 %  09/27/20 1327 (!) 139/97 (!) 52 15 100 %   No intake/output data recorded. No intake/output data recorded.      General : Alert,  cooperative, no distress, appears stated age   Head:  Normocephalic/atraumatic    Eyes: PERRL, conjunctiva/corneas clear, EOM's intact. Fundi could not be visualized Neck: Supple Chest:  Respirations unlabored Chest wall: no tenderness or deformity Heart: Regular rate and rhythm Abdomen: Soft, nontender and nondistended Extremities: warm and well-perfused Skin: normal turgor, color and texture Neurologic:  Alert, oriented x 3.  Eyes open spontaneously. PERRL, EOMI, VFC, no facial droop. V1-3 intact.  No dysarthria, tongue protrusion symmetric.  CNII-XII intact.  Incision well-healed.  No drainage or significant swelling.  Neck is soft.  No dysphonia.  4/5 strength right deltoid, bicep, tricep, 3/5 handgrip.  3/5 strength left deltoid, biceps, triceps,  2/5 handgrip.  Right leg strength 4/5, left leg strength 4+/5.       Data ReviewCBC:  Lab Results  Component Value Date   WBC 12.1 (H) 09/27/2020   RBC 4.59 09/27/2020   BMP:  Lab Results  Component Value Date   GLUCOSE 137 (H) 09/27/2020   CO2 26 09/27/2020   BUN 9 09/27/2020   CREATININE 0.89 09/27/2020   CALCIUM 9.2 09/27/2020   Radiology review:  Postoperative CT performed today was reviewed.  His anterior cervical plate and screws are in good position with no lucency or evidence of pseudoarthrosis.  His interbody is in good position in the anterior disc space.  There is mild residual posterior osteophyte and superior C4 body but no obvious residual discussed.  Is seen.  No significant prevertebral swelling or fluid collection.  His MRI of the cervical spine with and without contrast was reviewed.  Cord signal changes still present at C3-4.  Artifact limits evaluation, but there may be postoperative hemostatic products or blood products in the posterior interbody space feeling what previously was disc osteophyte.  There is no significant increase in his stenosis compared with preoperative MRI.  Assessment:   Active Problems:   *  No active hospital problems. *  This is a 55 year old man who is 2 weeks status post C3-4 ACDF for severe cord impingement who has increased weakness postoperatively.  His imaging is most consistent with expected postoperative changes.  His ESR is normal. Plan:  -these postoperative changes likely will evolve and resolve over time, but given his significant weakness, we will bring him into the hospital for observation for physical therapy and Occupational Therapy.  He may benefit from acute or subacute rehab eventually.  Additionally, I will give him steroids to see if it helps with some of his myelopathy symptoms.  I think that the risks of a revision surgery would be significant and would not be justified in this situation.  Patient verbalized understanding and agreement with the plan.

## 2020-09-27 NOTE — ED Notes (Signed)
Patient transported to MRI 

## 2020-09-27 NOTE — ED Triage Notes (Signed)
Patient arrived by [facility] from home. Patient had neck surgery on 6/20 and since that time has had bilateral arm/hand tingling with swelling. States that it is causing him to be off balance. Patient alert and oriented.

## 2020-09-27 NOTE — ED Provider Notes (Signed)
[facility] EMERGENCY DEPARTMENT Provider Note   CSN: [id number] Arrival date & time: 09/27/20  4098     History No chief complaint on file.   Harold [name] is a 55 y.o. male.  Harold [name] is s/p ACDF at C3/C4 performed 09/14/20 by Dr. Saintclair Halsted.  Prior to surgery, it was noted that he had 4+ strength in his hand intrinsics and wrist flexion.   He states that yesterday he developed worsened weakness in both his right and left hand.  His weakness in his left hand is worse than his right, and this was persistent since prior to surgery but is currently worse.  He also has some low back pain.  He has a history of having surgery in his lumbar spine.  He denies any recent injury.  He has been in trouble walking.  It was noted prior to his surgery that this was also happening.  However, he states that his right leg appears to be worse than his left.  He has had several surgeries in his right knee and attributes his symptoms to his knee giving way.  He has tried a walker and a cane without relief.  The history is provided by the patient.  Neurologic Problem This is a new problem. The current episode started yesterday. The problem occurs constantly. The problem has been rapidly worsening. Pertinent negatives include no chest pain, no abdominal pain, no headaches and no shortness of breath. Nothing aggravates the symptoms. Nothing relieves the symptoms. He has tried nothing for the symptoms. The treatment provided no relief.      Past Medical History:  Diagnosis Date   Acute idiopathic pericarditis 04/08/2010   Qualifier: Diagnosis of  By: [name], [name]     Acute sinusitis 06/04/2014   Anxiety    CHF (congestive heart failure) ([place]) 06/29/2010   was pericardititis not CHF   Chronic back pain    Constipation    DDD (degenerative disc disease), lumbar 09/14/2012   Depression    Erectile dysfunction 09/14/2012   GERD (gastroesophageal reflux disease)    ocassional   Gout,  unspecified 05/01/2007   Qualifier: Diagnosis of  By: [name] MD, Dellis Filbert A    Headache    migraines in the past   History of pancreatitis    a. admx 04-2009.Marland KitchenMarland Kitchen? 2-2 triglycerides   Hypertension    Hypertriglyceridemia    a. followed by [facility]   Left sided sciatica 09/14/2012   Neuropathy    Obesity    OSA (obstructive sleep apnea)    CPAP- not current   Restless legs    Type 2 diabetes mellitus with diabetic neuropathy, unspecified ([place]) 09/20/2019    Patient Active Problem List   Diagnosis Date Noted   HNP (herniated nucleus pulposus) with myelopathy, cervical 09/14/2020   Cheilitis 07/04/2020   HNP (herniated nucleus pulposus), lumbar 01/08/2020   Type 2 diabetes mellitus with diabetic neuropathy, unspecified ([place]) 09/20/2019   Right knee pain 09/20/2019   Chronic low back pain 08/06/2019   Recurrent falls 08/06/2019   Educated about COVID-19 virus infection 07/11/2019   Sinusitis 02/17/2019   Allergic rhinitis 02/13/2018   Sinus congestion 12/19/2016   Thrush 08/02/2016   Sleep disturbance 07/04/2016   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   Urinary frequency 07/24/2015   Trichomonas exposure 07/24/2015   Type 2 diabetes mellitus ([place]) 03/04/2015   Verruca vulgaris 02/24/2015   Nocturia 02/17/2015   Accessory skin tags 02/17/2015   Acute sinusitis 06/04/2014   Lumbar  radiculopathy, acute 05/23/2014   Acute left-sided back pain with sciatica 04/10/2014   Smoker 04/10/2014   Cough 01/01/2014   Grief reaction 11/19/2013   Primary localized osteoarthrosis, lower leg 09/04/2013   Chronic pain syndrome 07/11/2013   Hyperlipidemia 07/11/2013   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/09/2013   Gonalgia 11/29/2012   Encounter for well adult exam with abnormal findings 09/14/2012   DDD (degenerative disc disease), lumbar 09/14/2012   Left sided sciatica 09/14/2012   Erectile dysfunction 09/14/2012   Pre-ulcerative corn or callous 09/14/2012   Hemorrhoids 07/26/2011   Chronic  back pain greater than 3 months duration 09/23/2010   Abnormal EKG 08/12/2010   OSA (obstructive sleep apnea) 08/09/2010   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   CHF (congestive heart failure) ([place]) 06/29/2010   Edema 06/10/2010   Acute idiopathic pericarditis 04/08/2010   NONSPECIFIC ABNORMAL ELECTROCARDIOGRAM 04/08/2010   HYPERTRIGLYCERIDEMIA 05/26/2009   SHOULDER PAIN, LEFT 04/21/2008   Depression 03/11/2008   Essential hypertension 03/11/2008   Gout, unspecified 16/12/9602   METABOLIC SYNDROME X 54/11/8117   OBESITY NOS 10/24/2006    Past Surgical History:  Procedure Laterality Date   ANTERIOR CERVICAL DECOMP/DISCECTOMY FUSION N/A 09/14/2020   Procedure: Anterior Cervical Decompression Fusion - Cervical three-Cervical four;  Surgeon: Kary Kos, MD;  Location: [facility];  Service: Neurosurgery;  Laterality: N/A;   COLONOSCOPY     KNEE SURGERY Right    x3 scopes   KNEE SURGERY Right    open menisicus repair   LUMBAR LAMINECTOMY/DECOMPRESSION MICRODISCECTOMY N/A 08/26/2015   Procedure: Lumbar Two-Sacral One  Laminectomy for decompression;  Surgeon: [name], MD;  Location: [facility];  Service: Neurosurgery;  Laterality: N/A;   pericardectomy  07/15/2010   Dr. [name]   pleurx catheter placement  07/15/2010   Dr. [name]   SHOULDER SURGERY     right and left shoulders- Rotator cuff repair   TONSILLECTOMY AND ADENOIDECTOMY     one tonsil       Family History  Problem Relation Age of Onset   Diabetes Mother    Diabetes Father    Colon cancer Father 74   Heart disease Father 29       CABG   Pancreatitis Father    Hypertension Other        entire family   Liver cancer Paternal Uncle    Diabetes Sister    Stomach cancer Maternal Grandfather    Pancreatitis Paternal Uncle        x 2   Esophageal cancer Neg Hx    Rectal cancer Neg Hx    Colon polyps Neg Hx     Social History   Tobacco Use   Smoking status: Every Day    Packs/day: 0.33    Years:  36.00    Pack years: 11.88    Types: Cigarettes   Smokeless tobacco: Never   Tobacco comments:    cigar - 1 a week  Vaping Use   Vaping Use: Former  Substance Use Topics   Alcohol use: Yes    Comment: Maybe 2 times a month - 6 ounces each time   Drug use: No    Home Medications Prior to Admission medications   Medication Sig Start Date End Date Taking? Authorizing Provider  amLODipine (NORVASC) 10 MG tablet TAKE 1 TABLET(10 MG) BY MOUTH DAILY 07/06/20   Biagio Borg, MD  ascorbic acid (VITAMIN C) 500 MG tablet Take 500 mg by mouth daily.    [provider]  aspirin EC  81 MG tablet Take 81 mg by mouth daily.    [provider]  Blood Glucose Monitoring Suppl (ONE TOUCH ULTRA 2) w/Device KIT Use as directed daily E11.9 08/22/18   Biagio Borg, MD  citalopram (CELEXA) 40 MG tablet Take 1 tablet (40 mg total) by mouth daily. 08/31/20   Biagio Borg, MD  diclofenac Sodium (VOLTAREN) 1 % GEL Apply 1 application topically 3 (three) times daily as needed (PAIN).  07/17/19   [provider]  fenofibrate (TRICOR) 145 MG tablet Take 1 tablet (145 mg total) by mouth daily. 08/31/20   Biagio Borg, MD  fluticasone (FLONASE) 50 MCG/ACT nasal spray Place 2 sprays into both nostrils daily. 07/03/20   Biagio Borg, MD  furosemide (LASIX) 40 MG tablet Take 1 tablet (40 mg total) by mouth daily. 04/07/20   Minus Breeding, MD  gabapentin (NEURONTIN) 300 MG capsule Take 900 mg by mouth 3 (three) times daily.    [provider]  glucose blood test strip Use as instructed once daily E119 08/22/18   Biagio Borg, MD  ibuprofen (ADVIL) 800 MG tablet Take 800 mg by mouth 3 (three) times daily. 07/13/20   [provider]  Lancets MISC Use as directed daily E11.9 08/22/18   Biagio Borg, MD  lisinopril (ZESTRIL) 40 MG tablet TAKE ONE TABLET BY MOUTH DAILY 05/25/19   Biagio Borg, MD  loratadine (CLARITIN) 10 MG tablet Take 1 tablet (10 mg total) by mouth daily as needed for  allergies. 07/03/20   Biagio Borg, MD  magnesium oxide (MAG-OX) 400 MG tablet Take 400 mg by mouth daily.    [provider]  metFORMIN (GLUCOPHAGE-XR) 500 MG 24 hr tablet TAKE TWO TABLETS BY MOUTH DAILY WITH BREAKFAST 02/28/20   Biagio Borg, MD  methocarbamol (ROBAXIN) 500 MG tablet Take 500 mg by mouth 4 (four) times daily as needed for muscle spasms.    [provider]  Multiple Minerals-Vitamins (CALCIUM-MAGNESIUM-ZINC-D3) TABS Take 1 tablet by mouth daily.    [provider]  oxyCODONE-acetaminophen (PERCOCET) 10-325 MG tablet Take 1 tablet by mouth every 6 (six) hours as needed for pain. 09/14/20   Meyran, Ocie Cornfield, NP  Potassium Chloride ER 20 MEQ TBCR TAKE ONE TABLET BY MOUTH DAILY 03/28/19   Biagio Borg, MD  rosuvastatin (CRESTOR) 20 MG tablet TAKE 1 TABLET(20 MG) BY MOUTH DAILY 07/06/20   Biagio Borg, MD  sildenafil (VIAGRA) 100 MG tablet Take 0.5-1 tablets (50-100 mg total) by mouth daily as needed for erectile dysfunction. 08/06/19   Biagio Borg, MD  tiZANidine (ZANAFLEX) 4 MG tablet Take 4 mg by mouth 3 (three) times daily as needed for muscle spasms. 06/05/19   [provider]  vitamin B-12 (CYANOCOBALAMIN) 1000 MCG tablet Take 1 tablet (1,000 mcg total) by mouth daily. 08/08/19   Biagio Borg, MD  zinc gluconate 50 MG tablet Take 50 mg by mouth daily.    [provider]    Allergies    Other, Diphenhydramine, Pecan nut (diagnostic), Colchicine, Lipitor [atorvastatin], and Septra [sulfamethoxazole-trimethoprim]  Review of Systems   Review of Systems  Constitutional:  Negative for chills and fever.  HENT:  Negative for ear pain and sore throat.   Eyes:  Negative for pain and visual disturbance.  Respiratory:  Negative for cough and shortness of breath.   Cardiovascular:  Negative for chest pain and palpitations.  Gastrointestinal:  Negative for abdominal pain and vomiting.  Genitourinary:  Negative for dysuria and hematuria.   Musculoskeletal:  Positive for back pain, gait problem and neck pain. Negative for arthralgias.  Skin:  Negative for color change and rash.  Neurological:  Positive for weakness. Negative for seizures, syncope, numbness and headaches.  All other systems reviewed and are negative.  Physical Exam Updated Vital Signs BP (!) 146/81 (BP Location: Right Arm)   Pulse (!) 54   Temp 98.4 F (36.9 C) (Oral)   Resp 16   SpO2 100%   Physical Exam Vitals and nursing note reviewed.  Constitutional:      Appearance: Normal appearance.  HENT:     Head: Normocephalic and atraumatic.  Eyes:     Conjunctiva/sclera: Conjunctivae normal.  Neck:     Comments: Anterior neck incision is clean, dry, and intact.  There are Steri-Strips overlying the wound.  No swelling or erythema.  Gentle range of motion is possible and without significant pain.  Minimal and diffuse tenderness about his cervical spine. Pulmonary:     Effort: Pulmonary effort is normal. No respiratory distress.  Musculoskeletal:        General: No deformity. Normal range of motion.     Comments: No joint effusion of the right knee.  Anterior scar is well-healed.  Skin:    General: Skin is warm and dry.  Neurological:     Mental Status: He is alert and oriented to person, place, and time.     Sensory: Sensation is intact.     Motor: Weakness present.     Comments: There is bilateral grip strength weakness.  There is bilateral weakness of wrist flexion and extension.  There is weakness of biceps on the left, triceps on the left, and deltoid on the left.  There appears to be some weakness of the right hip flexor compared to the left.  He is unable to stand due to instability weakness.  Psychiatric:        Mood and Affect: Mood normal.    ED Results / Procedures / Treatments   Labs (all labs ordered are listed, but only abnormal results are displayed) Labs Reviewed  CBC WITH DIFFERENTIAL/PLATELET - Abnormal; Notable for the following  components:      Result Value   WBC 12.1 (*)    Platelets 508 (*)    Neutro Abs 8.3 (*)    Monocytes Absolute 1.1 (*)    All other components within normal limits  COMPREHENSIVE METABOLIC PANEL - Abnormal; Notable for the following components:   Potassium 3.4 (*)    Glucose, Bld 137 (*)    All other components within normal limits  RESP PANEL BY RT-PCR (FLU A&B, COVID) ARPGX2  BRAIN NATRIURETIC PEPTIDE  SEDIMENTATION RATE  C-REACTIVE PROTEIN  PROCALCITONIN  TROPONIN I (HIGH SENSITIVITY)  TROPONIN I (HIGH SENSITIVITY)    EKG None  Radiology DG Chest 2 View  Result Date: 09/27/2020 CLINICAL DATA:  Bilateral upper extremity swelling. EXAM: CHEST - 2 VIEW COMPARISON:  January 01, 2014. FINDINGS: Stable cardiomediastinal silhouette. Sternotomy wires are noted. Both lungs are clear. The visualized skeletal structures are unremarkable. IMPRESSION: No active cardiopulmonary disease. Electronically Signed   By: Marijo Conception M.D.   On: 09/27/2020 08:50   DG Cervical Spine 2-3 Views  Result Date: 09/27/2020 CLINICAL DATA:  Postop.  Neck surgery 09/14/2020 EXAM: CERVICAL SPINE - 2-3 VIEW COMPARISON:  CT and MRI earlier today. FINDINGS: Upright AP and lateral views of the cervical spine obtained. Skull base through C5-C6 are  visualized, lower cervical spine obscured by overlapping osseous and soft tissue structures. Anterior fusion at C3-C4 with interbody spacer in place. No periprosthetic lucency. Small amount of gas anterior to the surgical hardware as seen on recent CT. There is associated prevertebral soft tissue thickening at this level. IMPRESSION: 1. Anterior fusion at C3-C4 with interbody spacer in place, no periprosthetic lucency. Small amount of gas anterior to the surgical hardware as seen on earlier CT. 2. Prevertebral soft tissue thickening at this level. Electronically Signed   By: Keith Rake M.D.   On: 09/27/2020 19:24   CT CERVICAL SPINE W WO CONTRAST  Result Date:  09/27/2020 CLINICAL DATA:  Neck pain.  Recent surgery. EXAM: CT CERVICAL SPINE WITH AND WITHOUT CONTRAST TECHNIQUE: Multidetector CT imaging of the cervical spine was performed before and during intravenous contrast administration. Multiplanar CT image reconstructions were also generated. CONTRAST:  163m OMNIPAQUE IOHEXOL 300 MG/ML  SOLN COMPARISON:  None. FINDINGS: Alignment: Normal. Skull base and vertebrae: C3-4 ACDF. There is a small amount of gas anterior to the surgical hardware. No acute fracture. No evidence for discitis-osteomyelitis. Soft tissues and spinal canal: Small amount of gas and fluid anterior to the ACDF hardware. Disc levels:  Disc levels are better assessed on the earlier MRI. Upper chest: Negative. Other: There is no abnormal contrast enhancement visible at the operative site. IMPRESSION: 1. Status post C3-4 ACDF with small amount of gas and fluid anterior to the surgical hardware. No specific features to indicate infection over postoperative change, though both remain possibilities. Electronically Signed   By: KUlyses JarredM.D.   On: 09/27/2020 19:06   MR BRAIN WO CONTRAST  Result Date: 09/27/2020 CLINICAL DATA:  55year old male status post cervical spine surgery on 09/14/2020. Bilateral arm and hand tingling and swelling. EXAM: MRI HEAD WITHOUT CONTRAST TECHNIQUE: Multiplanar, multiecho pulse sequences of the brain and surrounding structures were obtained without intravenous contrast. COMPARISON:  Preoperative cervical spine MRI 05/21/2020. FINDINGS: Brain: Normal cerebral volume. No restricted diffusion to suggest acute infarction. No midline shift, mass effect, evidence of mass lesion, ventriculomegaly, extra-axial collection or acute intracranial hemorrhage. Cervicomedullary junction and pituitary are within normal limits. GPearline Cablesand white matter signal is within normal limits for age throughout the brain. No chronic cerebral blood products or convincing encephalomalacia. Vascular:  Major intracranial vascular flow voids are preserved. The right vertebral artery appears dominant. Skull and upper cervical spine: Partially visible cervical spine hardware artifact at C2-C3. Suggestion of increased upper cervical spinal stenosis at that level on series 9, image 13. Visualized bone marrow signal is within normal limits. Sinuses/Orbits: Negative orbits. Occasional trace paranasal sinus mucosal thickening or small retention cysts. Other: Mastoids are clear. Grossly normal visible internal auditory structures. Negative visible scalp and face. IMPRESSION: 1. Normal noncontrast MRI appearance of the brain. 2. Partially visible cervical spine hardware artifact with possible increased spinal stenosis in the upper cervical spine at C2-C3 since a February preoperative MRI. Dedicated Cervical Spine MRI may be valuable. Electronically Signed   By: HGenevie AnnM.D.   On: 09/27/2020 10:14   MR LUMBAR SPINE WO CONTRAST  Result Date: 09/27/2020 CLINICAL DATA:  Low back pain EXAM: MRI LUMBAR SPINE WITHOUT CONTRAST TECHNIQUE: Multiplanar, multisequence MR imaging of the lumbar spine was performed. No intravenous contrast was administered. COMPARISON:  10/04/2019 FINDINGS: Segmentation:  Standard. Alignment:  No significant listhesis. Vertebrae: Interval postoperative changes at L3-L5 with rods and pedicle screws and interbody spacers. The hardware is not well evaluated and there is  associated susceptibility artifact. There are laminectomies from L2-L3 through L5-S1. Conus medullaris and cauda equina: Conus extends to the L1 level. Conus and cauda equina appear normal. Paraspinal and other soft tissues: Postoperative changes. Disc levels: Congenital narrowing of the spinal canal. L1-L2:  No significant canal or foraminal stenosis. L2-L3:  No significant canal or foraminal stenosis. L3-L4: Interval fusion. There is low signal within the ventral epidural fat likely reflecting protruding spacer as seen on recent  radiograph. Decreased canal stenosis. There is artifact distorting the foramina without apparent significant stenosis. L4-L5: Interval fusion. Low signal within the prominent ventral epidural fat likely reflecting protruding spacer as seen on recent radiograph. Less likely residual/recurrent disc herniation. There is increased, still marked canal stenosis. Foramina are distorted by artifact with persistent but decreased stenosis bilaterally. L5-S1: Left greater than right facet arthropathy. Prominent ventral epidural fat partially effacing the thecal sac. No right foraminal stenosis. Minor left foraminal stenosis. Appearance is similar. IMPRESSION: Degenerative and postoperative changes as detailed above. There is decreased canal stenosis at L3-L4. Low signal within prominent ventral epidural fat at L4-L5 may reflect protruding spacer as seen on recent radiograph and less likely component of residual/recurrent disc herniation. There is increased, still marked canal stenosis. Electronically Signed   By: Macy Mis M.D.   On: 09/27/2020 16:16   MR Cervical Spine W or Wo Contrast  Result Date: 09/27/2020 CLINICAL DATA:  Bilateral arm and hand tingling and swelling. C3-4 ACDF 09/14/2020. EXAM: MRI CERVICAL SPINE WITHOUT AND WITH CONTRAST TECHNIQUE: Multiplanar and multiecho pulse sequences of the cervical spine, to include the craniocervical junction and cervicothoracic junction, were obtained without and with intravenous contrast. CONTRAST:  84m GADAVIST GADOBUTROL 1 MMOL/ML IV SOLN COMPARISON:  05/21/2020 FINDINGS: Despite efforts by the technologist and patient, motion artifact is present on today's exam and could not be eliminated. This reduces exam sensitivity and specificity. Alignment: Mild reversal the normal cervical lordosis, no subluxation. Vertebrae: Prevertebral edema extends from about C1 through the C6 level. Metal artifact related to the plate and screw fixator noted at C3-4. There is vertebral  edema at both of these levels. Cord: Abnormal accentuated cord signal at the C3 and C4 levels. This is similar to the 05/21/2020 exam and probably represents myelomalacia and/or cord edema, although direct comparison is are difficult given that the degree of motion artifact on today's exam complicates comparison and blurs the cord. No definite new cord lesions identified. Posterior Fossa, vertebral arteries, paraspinal tissues: Prevertebral edema as noted above, not unexpected in the postoperative setting. Disc levels: The various levels of central and foraminal impingement at the non operative levels are unchanged from the 05/21/2020 exam. At the C3-4 operative level, there is wedge-shaped accentuated T2 signal in the vicinity of the original intervertebral space bulging posteriorly to narrow the AP diameter of the thecal sac to about 4.5 mm on image 8 series 1. On post-contrast image 9 series 6 this appears to have rim enhancement and a small amount of central hypoenhancement for example image 9 series 6. Low-grade enhancement along the anterior epidural space extending from the lower C2 level through C4 as shown on images 7 through 10 of series 6, although not really unexpected in the postoperative setting, and not demonstrating a nonenhancing component to suggest overt epidural abscess. With regard to the contents of this posteriorly bulging process at C3-4, this could well represent granulation tissue with a small amount of blood products centrally; strictly speaking, given the rim enhancing appearance, imaging does not confidently  exclude the possibility of infection. IMPRESSION: 1. C3-4 ACDF 2 weeks ago, with rim enhancement and a small focus of central hypoenhancement at the C3-4 discectomy site bulging somewhat posteriorly to follow the contour of the previous spurring and chronic disc bulge at this level, with substantial narrowing of the AP diameter of the thecal sac similar to previous. Possibilities  include granulation tissue with a small amount of central blood products/hypoenhancement, versus early infection. There is some enhancement along the adjacent epidural space and prevertebral edema which are not necessarily unexpected in this postoperative setting, but also raise some concern for potential infection. Roughly stable cord edema/myelomalacia at this level compared to the preoperative exam. Because of the potential overlap between postoperative findings and infection and the lack of pathognomonic findings of infection, increased reliance on clinical presentation and other clinical signs is suggested. I would also recommend conventional radiographs of the cervical spine for complimentary assessment of the hardware. 2. The various levels of impingement at the non operative levels are unchanged from 05/21/2020. Electronically Signed   By: Van Clines M.D.   On: 09/27/2020 16:45    Procedures Procedures   Medications Ordered in ED Medications - No data to display  ED Course  I have reviewed the triage vital signs and the nursing notes.  Pertinent labs & imaging results that were available during my care of the patient were reviewed by me and considered in my medical decision making (see chart for details).  Clinical Course as of 09/27/20 2049  Nancy Fetter Sep 27, 2020  1727 I spoke with Dr. Marcello Moores, neurosurgery, who recommends CT of the cervical spine, ESR, CRP, procalcitonin. Will follow. [AW]  1914 Dr. Marcello Moores recommends decadron 4 mg and admit for obs. [AW]    Clinical Course User Index [AW] Arnaldo Natal, MD   MDM Rules/Calculators/A&P                          Harold [name] presented after recent cervical spine surgery.  He was endorsing worsened upper extremity weakness, and he was also complaining of lower extremity weakness that was possibly related to low back pain or chronic knee instability.  Imaging of his cervical spine failed to reveal acute pathology, although there  were nonspecific, likely postoperative findings that could have related to an infectious source.  His other work-up, including labs, vital signs, did not suggest acute infection.  Nonetheless, he will be admitted for observation, steroids, and physical therapy consultation. Final Clinical Impression(s) / ED Diagnoses Final diagnoses:  Cervical myelopathy [facility])    Rx / DC Orders ED Discharge Orders     None        Arnaldo Natal, MD 09/27/20 2052

## 2020-09-28 LAB — GLUCOSE, CAPILLARY
Glucose-Capillary: 190 mg/dL — ABNORMAL HIGH (ref 70–99)
Glucose-Capillary: 174 mg/dL — ABNORMAL HIGH (ref 70–99)
Glucose-Capillary: 240 mg/dL — ABNORMAL HIGH (ref 70–99)

## 2020-09-28 MED ORDER — LISINOPRIL 20 MG PO TABS
40.0000 mg | ORAL_TABLET | Freq: Every day | ORAL | Status: DC
  Administered 2020-09-28 – 2020-09-30 (×3): 40 mg via ORAL
  Filled 2020-09-28 (×3): qty 2

## 2020-09-28 NOTE — Evaluation (Signed)
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  His imaging is most consistent with expected postoperative changes.  His ESR is normal.  Clinical Impression   Pt presents with impaired strength, poor balance with history of multiple falls since surgery, post-operative and low back pain, difficulty performing mobility tasks, and decreased activity tolerance. Pt to benefit from acute PT to address deficits. Pt requiring min assist for repeated stands with stedy, x1 stand attempted with RW and pt exhibited bilat knee buckling requiring PT assist to controlled lower back to bed. Pt endorses significant functional decline and LE weakness since surgery and falls. PT recommending CIR consult. PT to progress mobility as tolerated, and will continue to follow acutely.      Follow Up Recommendations CIR    Equipment Recommendations  Other (comment) (tbd)    Recommendations for Other Services       Precautions / Restrictions Precautions Precautions: Cervical Precaution Booklet Issued: No Required Braces or Orthoses: Cervical Brace (active orders state no brace needed, prior admission states soft collar apply in sitting position) Cervical Brace: Soft collar (Applied in sitting position) Restrictions Weight Bearing Restrictions: No      Mobility  Bed Mobility Overal bed mobility: Needs Assistance Bed Mobility: Rolling;Sidelying to Sit Rolling: Min guard Sidelying to sit: Min guard       General bed mobility comments: for safety, cues for technique and use of bedrail    Transfers Overall transfer level: Needs assistance Equipment used: Rolling walker (2 wheeled);Ambulation equipment used Transfers: Sit to/from Stand Sit to Stand: Min assist;From elevated  surface         General transfer comment: min assist for rise, steady, x1 lateral step towards HOB with bilat LE buckling requiring PT assist to lower pt back onto bed safely. Bari stedy utilized after this, STS x4 in stedy with assist for rise and slow lower onto support surface.  Ambulation/Gait             General Gait Details: nt  Stairs            Wheelchair Mobility    Modified Rankin (Stroke Patients Only)       Balance Overall balance assessment: History of Falls;Needs assistance (Pt reports he has fallen "four or more" times since surgery) Sitting-balance support: No upper extremity supported;Feet supported   Sitting balance - Comments: able to sit EOB without PT support   Standing balance support: Bilateral upper extremity supported;During functional activity Standing balance-Leahy Scale: Poor Standing balance comment: reliant on external support                             Pertinent Vitals/Pain Pain Assessment: Faces Faces Pain Scale: Hurts even more Pain Location: low back Pain Descriptors / Indicators: Sore;Aching Pain Intervention(s): Limited activity within patient's tolerance;Monitored during session;Patient requesting pain meds-RN notified    Home Living Family/patient expects to be discharged to:: Private residence Living Arrangements: Spouse/significant other Available Help at Discharge: Family;Available 24 hours/day Type of Home: House Home Access: Stairs to enter Entrance Stairs-Rails: Right;Left;Can reach both Entrance Stairs-Number of Steps: 3 Home Layout: Two level;1/2 bath on main level Home Equipment: Cane - single point;Walker - 2 wheels;Electric scooter;Other (comment) Additional Comments: reacher    Prior Function Level of  Independence: Needs assistance   Gait / Transfers Assistance Needed: pt reports he has not gotten up many times since surgey, and says he has fallen "four or more times"  ADL's / Homemaking  Assistance Needed: recieved a lot of assistance for all ADLs, used urinal  Comments: pt has had a lot of trouble since surgery due to decline in strength in his R knee and L arm     Hand Dominance   Dominant Hand: Right    Extremity/Trunk Assessment   Upper Extremity Assessment Upper Extremity Assessment: Defer to OT evaluation    Lower Extremity Assessment Lower Extremity Assessment: Generalized weakness (at least 3/5 sitting EOB, functionally displays bilat knee buckling, standing tolerance <10 seconds)    Cervical / Trunk Assessment Cervical / Trunk Assessment: Other exceptions Cervical / Trunk Exceptions: s/p ACDF 6/20; forward head, rounded shoulders  Communication   Communication: No difficulties  Cognition Arousal/Alertness: Awake/alert Behavior During Therapy: Flat affect Overall Cognitive Status: Within Functional Limits for tasks assessed                                 General Comments: Pt woken from sleep by PT, is initially drowsy and difficult to understand but improved with continuation of session.      General Comments      Exercises     Assessment/Plan    PT Assessment Patient needs continued PT services  PT Problem List Pain;Decreased balance       PT Treatment Interventions DME instruction;Balance training;Gait training;Functional mobility training;Therapeutic activities;Therapeutic exercise;Patient/family education;Neuromuscular re-education    PT Goals (Current goals can be found in the Care Plan section)  Acute Rehab PT Goals Patient Stated Goal: get my legs stronger, stop falling PT Goal Formulation: With patient/family Time For Goal Achievement: 10/12/20 Potential to Achieve Goals: Good    Frequency Min 3X/week   Barriers to discharge        Co-evaluation               AM-PAC PT "6 Clicks" Mobility  Outcome Measure Help needed turning from your back to your side while in a flat bed without using bedrails?: A  Little Help needed moving from lying on your back to sitting on the side of a flat bed without using bedrails?: A Little Help needed moving to and from a bed to a chair (including a wheelchair)?: A Little Help needed standing up from a chair using your arms (e.g., wheelchair or bedside chair)?: A Little Help needed to walk in hospital room?: Total Help needed climbing 3-5 steps with a railing? : Total 6 Click Score: 14    End of Session Equipment Utilized During Treatment: Cervical collar;Gait belt Activity Tolerance: Patient tolerated treatment well;Patient limited by fatigue Patient left: with call bell/phone within reach;with family/visitor present;in chair;with chair alarm set Nurse Communication: Mobility status PT Visit Diagnosis: Pain Pain - part of body:  (neck)    Time: 3524-8185 PT Time Calculation (min) (ACUTE ONLY): 28 min   Charges:   PT Evaluation $PT Eval Low Complexity: 1 Low PT Treatments $Therapeutic Activity: 8-22 mins       Stacie Glaze, PT DPT Acute Rehabilitation Services Pager [phone]  Office [phone]   Jacumba E Stroup 09/28/2020, 1:15 PM

## 2020-09-28 NOTE — Progress Notes (Signed)
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  His imaging is most consistent with expected postoperative changes.  His ESR is normal.   Clinical Impression   Harold [name] was evaluated sp/ the above impairments. PTA and post previous 6/20 admission pt reports he was receiving a lot of assistance for ADLs and "did not get up much," because he had "4 or more" falls when attempting to get up. Pt lives in a 2 level home with the main bed and full bath on the second level, he has been sleeping on the couch and using a hand held urinal. Upon evaluation pt has significant weakness in his LUE & RLE. He was agitated throughout due to not eating in "over 24 hours" but declined to allow therapist order him food. Pt is currently min A over all for bed mobility, max A +2 for OOB, and mod/max for ADLs due to bed level assistance. Pt benefits from continued OT acutely to progress function in Adls and mobility. Recommend d/c to CIR for intensive therapies to progress independence in function.     Follow Up Recommendations  Supervision/Assistance - 24 hour;CIR    Equipment Recommendations  None recommended by OT       Precautions / Restrictions Precautions Precautions: Cervical Precaution Booklet Issued: No Precaution Comments: verbally reviewed all cervical precautions. Pt recalled 3/3. Required Braces or Orthoses: Cervical Brace (active orders state no brace needed, prior admission states soft collar apply in sitting position) Cervical Brace: Soft collar (Applied in sitting position) Restrictions Weight Bearing Restrictions: No Other Position/Activity Restrictions: no lifting more than 5lbs      Mobility Bed Mobility Overal bed mobility: Needs Assistance Bed Mobility:  Rolling;Sidelying to Sit;Sit to Sidelying Rolling: Min guard Sidelying to sit: Min guard     Sit to sidelying: Min assist General bed mobility comments: Min A for RLE management back into bed    Transfers Overall transfer level: Needs assistance               General transfer comment: pt refused to attmept sit<>stand this session due to fear of falling adn not having food in "24 hours" and he requests +2 assistance to stand    Balance Overall balance assessment: History of Falls (Pt reports he has fallen "four or more" times since surgery)         ADL either performed or assessed with clinical judgement   ADL Overall ADL's : Needs assistance/impaired Eating/Feeding: Set up;Sitting   Grooming: Wash/dry hands;Wash/dry face;Oral care;Applying deodorant;Set up;Sitting   Upper Body Bathing: Minimal assistance;Sitting   Lower Body Bathing: Moderate assistance;Sitting/lateral leans   Upper Body Dressing : Set up;Sitting   Lower Body Dressing: Maximal assistance;Sitting/lateral leans   Toilet Transfer: Total assistance   Toileting- Clothing Manipulation and Hygiene: Moderate assistance;Sitting/lateral lean       Functional mobility during ADLs: Maximal assistance;+2 for physical assistance;+2 for safety/equipment;Rolling walker (limited to bed mobility - pt refused to attempt to stand on an "empty stomach" and without +2 assistance) General ADL Comments: ADLs limited to bed level this session     Vision Baseline Vision/History: No visual deficits Patient Visual Report: No change from baseline Vision Assessment?: No apparent visual deficits            Pertinent Vitals/Pain Pain Assessment: Faces Faces Pain  Scale: Hurts even more Pain Location: surgical site, RUE Pain Descriptors / Indicators: Discomfort Pain Intervention(s): Limited activity within patient's tolerance;Monitored during session     Hand Dominance Right   Extremity/Trunk Assessment Upper  Extremity Assessment Upper Extremity Assessment: LUE deficits/detail;RUE deficits/detail RUE Deficits / Details: Shoulder and elbow strength 5/5, grip strength 4/5 RUE Sensation: WNL RUE Coordination: decreased fine motor LUE Deficits / Details: LUE gross strength is 3/5 LUE Sensation: WNL LUE Coordination: decreased fine motor;decreased gross motor   Lower Extremity Assessment Lower Extremity Assessment: Defer to PT evaluation   Cervical / Trunk Assessment Cervical / Trunk Assessment: Other exceptions Cervical / Trunk Exceptions: s/p ACDF 6/20   Communication Communication Communication: No difficulties   Cognition Arousal/Alertness: Awake/alert Behavior During Therapy: Agitated Overall Cognitive Status: Within Functional Limits for tasks assessed       General Comments: agitated stating "i haven't had no food or protien in 24 hours." When therapist attempted to assist him in ordering breakfast or offered snacks, he refused. Agitates throguhout and requried gentle encouragement. Also expressed fear of falling   General Comments  VSS on RA, pt extremely frustrated and agitated throughout session, only agreeable to sit [place] expects to be discharged to:: Private residence Living Arrangements: Spouse/significant other Available Help at Discharge: Family;Available 24 hours/day Type of Home: House Home Access: Stairs to enter [employer] of Steps: 3 Entrance Stairs-Rails: Right;Left;Can reach both Home Layout: Two level;1/2 bath on main level Alternate Level Stairs-Number of Steps: Full-flight to 2nd level  - reports sleeping on couch downstairs Alternate Level Stairs-Rails: Right;Left;Can reach both Bathroom Shower/Tub: Teacher, early years/pre: Standard Bathroom Accessibility: Yes   Home Equipment: Cane - single point;Walker - 2 wheels;Electric scooter;Other (comment)   Additional Comments: reacher      Prior  Functioning/Environment Level of Independence: Needs assistance  Gait / Transfers Assistance Needed: pt reports he has not gotten up many times since surgey, and says he has fallen "four or more times" ADL's / Homemaking Assistance Needed: recieved a lot of assistance for all ADLs, used urinal Communication / Swallowing Assistance Needed: WFL Comments: pt has had a lot of tourble since surgery due to decline in strength in his R knee and L arm        OT Problem List: Decreased strength;Decreased range of motion;Decreased activity tolerance;Impaired balance (sitting and/or standing);Decreased safety awareness;Decreased knowledge of use of DME or AE;Decreased knowledge of precautions;Pain;Impaired UE functional use      OT Treatment/Interventions: Self-care/ADL training;Therapeutic exercise;DME and/or AE instruction;Therapeutic activities;Patient/family education;Balance training    OT Goals(Current goals can be found in the care plan section) Acute Rehab OT Goals Patient Stated Goal: to eat OT Goal Formulation: With patient Time For Goal Achievement: 10/12/20 Potential to Achieve Goals: Fair ADL Goals Pt Will Perform Upper Body Bathing: with set-up;sitting Pt Will Perform Lower Body Bathing: with supervision;sit to/from stand;with adaptive equipment Pt Will Perform Upper Body Dressing: with modified independence;sitting;standing Pt Will Perform Lower Body Dressing: with min guard assist;with adaptive equipment;sit to/from stand Pt Will Transfer to Toilet: with min guard assist;ambulating Pt Will Perform Toileting - Clothing Manipulation and hygiene: with supervision;sit to/from stand  OT Frequency: Min 2X/week   Barriers to D/C: Inaccessible home environment  Pt main bed and bath are on 2nd level of home       Co-evaluation              AM-PAC OT "6 Clicks" Daily Activity  Outcome Measure Help from another person eating meals?: A Little Help from another person taking  care of personal grooming?: A Little Help from another person toileting, which includes using toliet, bedpan, or urinal?: Total Help from another person bathing (including washing, rinsing, drying)?: A Lot Help from another person to put on and taking off regular upper body clothing?: A Little Help from another person to put on and taking off regular lower body clothing?: A Lot 6 Click Score: 14   End of Session Equipment Utilized During Treatment: Cervical collar Nurse Communication: Mobility status (Assist pt in ordering food)  Activity Tolerance: Treatment limited secondary to agitation;Patient tolerated treatment well Patient left: in bed;with call bell/phone within reach;with bed alarm set  OT Visit Diagnosis: Unsteadiness on feet (R26.81);Other abnormalities of gait and mobility (R26.89);Repeated falls (R29.6);Muscle weakness (generalized) (M62.81);History of falling (Z91.81);Pain;Hemiplegia and hemiparesis                Time: 0800-0822 OT Time Calculation (min): 22 min Charges:  OT General Charges $OT Visit: 1 Visit OT Evaluation $OT Eval Moderate Complexity: 1 Mod    Elfa Wooton A Elwyn Lowden 09/28/2020, 9:40 AM

## 2020-09-28 NOTE — Progress Notes (Signed)
Subjective: Patient reports some improvement in his strength compared with yesterday  Objective: Vital signs in last 24 hours: Temp:  [97.7 F (36.5 C)-98.8 F (37.1 C)] 97.7 F (36.5 C) (07/04 0732) Pulse Rate:  [52-60] 58 (07/04 0732) Resp:  [15-20] 18 (07/04 0732) BP: (133-155)/(78-97) 155/81 (07/04 0732) SpO2:  [90 %-100 %] 100 % (07/04 0732) Weight:  [532.9 kg] 114.2 kg (07/03 2106)  Intake/Output from previous day: No intake/output data recorded. Intake/Output this shift: No intake/output data recorded.  Neck soft. No dysphonia 4/5 proximal Ues,  hand grip bilaterally, 4+/5 LE strength Condom cath in place  Lab Results: Recent Labs    09/27/20 0829  WBC 12.1*  HGB 14.3  HCT 43.6  PLT 508*   BMET Recent Labs    09/27/20 0829  NA 138  K 3.4*  CL 101  CO2 26  GLUCOSE 137*  BUN 9  CREATININE 0.89  CALCIUM 9.2    Studies/Results: DG Chest 2 View  Result Date: 09/27/2020 CLINICAL DATA:  Bilateral upper extremity swelling. EXAM: CHEST - 2 VIEW COMPARISON:  January 01, 2014. FINDINGS: Stable cardiomediastinal silhouette. Sternotomy wires are noted. Both lungs are clear. The visualized skeletal structures are unremarkable. IMPRESSION: No active cardiopulmonary disease. Electronically Signed   By: Marijo Conception M.D.   On: 09/27/2020 08:50   DG Cervical Spine 2-3 Views  Result Date: 09/27/2020 CLINICAL DATA:  Postop.  Neck surgery 09/14/2020 EXAM: CERVICAL SPINE - 2-3 VIEW COMPARISON:  CT and MRI earlier today. FINDINGS: Upright AP and lateral views of the cervical spine obtained. Skull base through C5-C6 are visualized, lower cervical spine obscured by overlapping osseous and soft tissue structures. Anterior fusion at C3-C4 with interbody spacer in place. No periprosthetic lucency. Small amount of gas anterior to the surgical hardware as seen on recent CT. There is associated prevertebral soft tissue thickening at this level. IMPRESSION: 1. Anterior fusion at C3-C4  with interbody spacer in place, no periprosthetic lucency. Small amount of gas anterior to the surgical hardware as seen on earlier CT. 2. Prevertebral soft tissue thickening at this level. Electronically Signed   By: Keith Rake M.D.   On: 09/27/2020 19:24   CT CERVICAL SPINE W WO CONTRAST  Result Date: 09/27/2020 CLINICAL DATA:  Neck pain.  Recent surgery. EXAM: CT CERVICAL SPINE WITH AND WITHOUT CONTRAST TECHNIQUE: Multidetector CT imaging of the cervical spine was performed before and during intravenous contrast administration. Multiplanar CT image reconstructions were also generated. CONTRAST:  172mL OMNIPAQUE IOHEXOL 300 MG/ML  SOLN COMPARISON:  None. FINDINGS: Alignment: Normal. Skull base and vertebrae: C3-4 ACDF. There is a small amount of gas anterior to the surgical hardware. No acute fracture. No evidence for discitis-osteomyelitis. Soft tissues and spinal canal: Small amount of gas and fluid anterior to the ACDF hardware. Disc levels:  Disc levels are better assessed on the earlier MRI. Upper chest: Negative. Other: There is no abnormal contrast enhancement visible at the operative site. IMPRESSION: 1. Status post C3-4 ACDF with small amount of gas and fluid anterior to the surgical hardware. No specific features to indicate infection over postoperative change, though both remain possibilities. Electronically Signed   By: Ulyses Jarred M.D.   On: 09/27/2020 19:06   MR BRAIN WO CONTRAST  Result Date: 09/27/2020 CLINICAL DATA:  55 year old male status post cervical spine surgery on 09/14/2020. Bilateral arm and hand tingling and swelling. EXAM: MRI HEAD WITHOUT CONTRAST TECHNIQUE: Multiplanar, multiecho pulse sequences of the brain and surrounding structures were obtained  without intravenous contrast. COMPARISON:  Preoperative cervical spine MRI 05/21/2020. FINDINGS: Brain: Normal cerebral volume. No restricted diffusion to suggest acute infarction. No midline shift, mass effect, evidence of  mass lesion, ventriculomegaly, extra-axial collection or acute intracranial hemorrhage. Cervicomedullary junction and pituitary are within normal limits. Pearline Cables and white matter signal is within normal limits for age throughout the brain. No chronic cerebral blood products or convincing encephalomalacia. Vascular: Major intracranial vascular flow voids are preserved. The right vertebral artery appears dominant. Skull and upper cervical spine: Partially visible cervical spine hardware artifact at C2-C3. Suggestion of increased upper cervical spinal stenosis at that level on series 9, image 13. Visualized bone marrow signal is within normal limits. Sinuses/Orbits: Negative orbits. Occasional trace paranasal sinus mucosal thickening or small retention cysts. Other: Mastoids are clear. Grossly normal visible internal auditory structures. Negative visible scalp and face. IMPRESSION: 1. Normal noncontrast MRI appearance of the brain. 2. Partially visible cervical spine hardware artifact with possible increased spinal stenosis in the upper cervical spine at C2-C3 since a February preoperative MRI. Dedicated Cervical Spine MRI may be valuable. Electronically Signed   By: Genevie Ann M.D.   On: 09/27/2020 10:14   MR LUMBAR SPINE WO CONTRAST  Result Date: 09/27/2020 CLINICAL DATA:  Low back pain EXAM: MRI LUMBAR SPINE WITHOUT CONTRAST TECHNIQUE: Multiplanar, multisequence MR imaging of the lumbar spine was performed. No intravenous contrast was administered. COMPARISON:  10/04/2019 FINDINGS: Segmentation:  Standard. Alignment:  No significant listhesis. Vertebrae: Interval postoperative changes at L3-L5 with rods and pedicle screws and interbody spacers. The hardware is not well evaluated and there is associated susceptibility artifact. There are laminectomies from L2-L3 through L5-S1. Conus medullaris and cauda equina: Conus extends to the L1 level. Conus and cauda equina appear normal. Paraspinal and other soft tissues:  Postoperative changes. Disc levels: Congenital narrowing of the spinal canal. L1-L2:  No significant canal or foraminal stenosis. L2-L3:  No significant canal or foraminal stenosis. L3-L4: Interval fusion. There is low signal within the ventral epidural fat likely reflecting protruding spacer as seen on recent radiograph. Decreased canal stenosis. There is artifact distorting the foramina without apparent significant stenosis. L4-L5: Interval fusion. Low signal within the prominent ventral epidural fat likely reflecting protruding spacer as seen on recent radiograph. Less likely residual/recurrent disc herniation. There is increased, still marked canal stenosis. Foramina are distorted by artifact with persistent but decreased stenosis bilaterally. L5-S1: Left greater than right facet arthropathy. Prominent ventral epidural fat partially effacing the thecal sac. No right foraminal stenosis. Minor left foraminal stenosis. Appearance is similar. IMPRESSION: Degenerative and postoperative changes as detailed above. There is decreased canal stenosis at L3-L4. Low signal within prominent ventral epidural fat at L4-L5 may reflect protruding spacer as seen on recent radiograph and less likely component of residual/recurrent disc herniation. There is increased, still marked canal stenosis. Electronically Signed   By: Macy Mis M.D.   On: 09/27/2020 16:16   MR Cervical Spine W or Wo Contrast  Result Date: 09/27/2020 CLINICAL DATA:  Bilateral arm and hand tingling and swelling. C3-4 ACDF 09/14/2020. EXAM: MRI CERVICAL SPINE WITHOUT AND WITH CONTRAST TECHNIQUE: Multiplanar and multiecho pulse sequences of the cervical spine, to include the craniocervical junction and cervicothoracic junction, were obtained without and with intravenous contrast. CONTRAST:  50mL GADAVIST GADOBUTROL 1 MMOL/ML IV SOLN COMPARISON:  05/21/2020 FINDINGS: Despite efforts by the technologist and patient, motion artifact is present on today's  exam and could not be eliminated. This reduces exam sensitivity and specificity. Alignment: Mild  reversal the normal cervical lordosis, no subluxation. Vertebrae: Prevertebral edema extends from about C1 through the C6 level. Metal artifact related to the plate and screw fixator noted at C3-4. There is vertebral edema at both of these levels. Cord: Abnormal accentuated cord signal at the C3 and C4 levels. This is similar to the 05/21/2020 exam and probably represents myelomalacia and/or cord edema, although direct comparison is are difficult given that the degree of motion artifact on today's exam complicates comparison and blurs the cord. No definite new cord lesions identified. Posterior Fossa, vertebral arteries, paraspinal tissues: Prevertebral edema as noted above, not unexpected in the postoperative setting. Disc levels: The various levels of central and foraminal impingement at the non operative levels are unchanged from the 05/21/2020 exam. At the C3-4 operative level, there is wedge-shaped accentuated T2 signal in the vicinity of the original intervertebral space bulging posteriorly to narrow the AP diameter of the thecal sac to about 4.5 mm on image 8 series 1. On post-contrast image 9 series 6 this appears to have rim enhancement and a small amount of central hypoenhancement for example image 9 series 6. Low-grade enhancement along the anterior epidural space extending from the lower C2 level through C4 as shown on images 7 through 10 of series 6, although not really unexpected in the postoperative setting, and not demonstrating a nonenhancing component to suggest overt epidural abscess. With regard to the contents of this posteriorly bulging process at C3-4, this could well represent granulation tissue with a small amount of blood products centrally; strictly speaking, given the rim enhancing appearance, imaging does not confidently exclude the possibility of infection. IMPRESSION: 1. C3-4 ACDF 2 weeks  ago, with rim enhancement and a small focus of central hypoenhancement at the C3-4 discectomy site bulging somewhat posteriorly to follow the contour of the previous spurring and chronic disc bulge at this level, with substantial narrowing of the AP diameter of the thecal sac similar to previous. Possibilities include granulation tissue with a small amount of central blood products/hypoenhancement, versus early infection. There is some enhancement along the adjacent epidural space and prevertebral edema which are not necessarily unexpected in this postoperative setting, but also raise some concern for potential infection. Roughly stable cord edema/myelomalacia at this level compared to the preoperative exam. Because of the potential overlap between postoperative findings and infection and the lack of pathognomonic findings of infection, increased reliance on clinical presentation and other clinical signs is suggested. I would also recommend conventional radiographs of the cervical spine for complimentary assessment of the hardware. 2. The various levels of impingement at the non operative levels are unchanged from 05/21/2020. Electronically Signed   By: Van Clines M.D.   On: 09/27/2020 16:45    Assessment/Plan: 55 yo M who is 2 weeks s/p C3-4 ACDF and has persistent myelopathy - will continue steroids  - PT/OT, possible CIR   Vallarie Mare 09/28/2020, 11:42 AM

## 2020-09-29 DIAGNOSIS — I509 Heart failure, unspecified: Secondary | ICD-10-CM | POA: Diagnosis present

## 2020-09-29 DIAGNOSIS — Z888 Allergy status to other drugs, medicaments and biological substances status: Secondary | ICD-10-CM | POA: Diagnosis not present

## 2020-09-29 DIAGNOSIS — F17210 Nicotine dependence, cigarettes, uncomplicated: Secondary | ICD-10-CM | POA: Diagnosis present

## 2020-09-29 DIAGNOSIS — E1140 Type 2 diabetes mellitus with diabetic neuropathy, unspecified: Secondary | ICD-10-CM | POA: Diagnosis present

## 2020-09-29 DIAGNOSIS — G959 Disease of spinal cord, unspecified: Secondary | ICD-10-CM | POA: Diagnosis present

## 2020-09-29 DIAGNOSIS — G4733 Obstructive sleep apnea (adult) (pediatric): Secondary | ICD-10-CM | POA: Diagnosis present

## 2020-09-29 DIAGNOSIS — E8881 Metabolic syndrome: Secondary | ICD-10-CM | POA: Diagnosis present

## 2020-09-29 DIAGNOSIS — M5000 Cervical disc disorder with myelopathy, unspecified cervical region: Secondary | ICD-10-CM | POA: Diagnosis not present

## 2020-09-29 DIAGNOSIS — Z8249 Family history of ischemic heart disease and other diseases of the circulatory system: Secondary | ICD-10-CM | POA: Diagnosis not present

## 2020-09-29 DIAGNOSIS — K219 Gastro-esophageal reflux disease without esophagitis: Secondary | ICD-10-CM | POA: Diagnosis present

## 2020-09-29 DIAGNOSIS — E781 Pure hyperglyceridemia: Secondary | ICD-10-CM | POA: Diagnosis present

## 2020-09-29 DIAGNOSIS — G2581 Restless legs syndrome: Secondary | ICD-10-CM | POA: Diagnosis present

## 2020-09-29 DIAGNOSIS — G9589 Other specified diseases of spinal cord: Secondary | ICD-10-CM | POA: Diagnosis present

## 2020-09-29 DIAGNOSIS — G894 Chronic pain syndrome: Secondary | ICD-10-CM | POA: Diagnosis present

## 2020-09-29 DIAGNOSIS — Z91018 Allergy to other foods: Secondary | ICD-10-CM | POA: Diagnosis not present

## 2020-09-29 DIAGNOSIS — Z981 Arthrodesis status: Secondary | ICD-10-CM | POA: Diagnosis not present

## 2020-09-29 DIAGNOSIS — Z20822 Contact with and (suspected) exposure to covid-19: Secondary | ICD-10-CM | POA: Diagnosis present

## 2020-09-29 DIAGNOSIS — N529 Male erectile dysfunction, unspecified: Secondary | ICD-10-CM | POA: Diagnosis present

## 2020-09-29 DIAGNOSIS — Z881 Allergy status to other antibiotic agents status: Secondary | ICD-10-CM | POA: Diagnosis not present

## 2020-09-29 DIAGNOSIS — E785 Hyperlipidemia, unspecified: Secondary | ICD-10-CM | POA: Diagnosis present

## 2020-09-29 DIAGNOSIS — I110 Hypertensive heart disease with heart failure: Secondary | ICD-10-CM | POA: Diagnosis present

## 2020-09-29 DIAGNOSIS — Z833 Family history of diabetes mellitus: Secondary | ICD-10-CM | POA: Diagnosis not present

## 2020-09-29 LAB — GLUCOSE, CAPILLARY
Glucose-Capillary: 170 mg/dL — ABNORMAL HIGH (ref 70–99)
Glucose-Capillary: 172 mg/dL — ABNORMAL HIGH (ref 70–99)
Glucose-Capillary: 257 mg/dL — ABNORMAL HIGH (ref 70–99)
Glucose-Capillary: 175 mg/dL — ABNORMAL HIGH (ref 70–99)

## 2020-09-29 MED ORDER — ENOXAPARIN SODIUM 40 MG/0.4ML IJ SOSY
40.0000 mg | PREFILLED_SYRINGE | INTRAMUSCULAR | Status: DC
  Administered 2020-09-29 – 2020-09-30 (×2): 40 mg via SUBCUTANEOUS
  Filled 2020-09-29 (×2): qty 0.4

## 2020-09-29 MED ORDER — DEXAMETHASONE 2 MG PO TABS: 2.0000 mg | ORAL_TABLET | Freq: Two times a day (BID) | ORAL | Status: DC

## 2020-09-29 MED ORDER — DEXAMETHASONE 2 MG PO TABS: 2.0000 mg | ORAL_TABLET | Freq: Three times a day (TID) | ORAL | Status: DC

## 2020-09-29 MED ORDER — DEXAMETHASONE 0.5 MG PO TABS: 1.0000 mg | ORAL_TABLET | Freq: Every day | ORAL | Status: DC

## 2020-09-29 MED ORDER — DEXAMETHASONE 4 MG PO TABS
4.0000 mg | ORAL_TABLET | Freq: Three times a day (TID) | ORAL | Status: DC
  Administered 2020-09-29 – 2020-09-30 (×4): 4 mg via ORAL
  Filled 2020-09-29 (×5): qty 1

## 2020-09-29 MED ORDER — DEXAMETHASONE 0.5 MG PO TABS: 1.0000 mg | ORAL_TABLET | Freq: Two times a day (BID) | ORAL | Status: DC

## 2020-09-29 NOTE — Plan of Care (Signed)
  Problem: Education: Goal: Knowledge of General Education information will improve Description: Including pain rating scale, medication(s)/side effects and non-pharmacologic comfort measures Outcome: Progressing   Problem: Activity: Goal: Risk for activity intolerance will decrease Outcome: Progressing   Problem: Pain Managment: Goal: General experience of comfort will improve Outcome: Progressing   

## 2020-09-29 NOTE — Progress Notes (Signed)
Subjective: Patient reports strength is significantly improved even compared with yesterday  Objective: Vital signs in last 24 hours: Temp:  [97.5 F (36.4 C)-98.5 F (36.9 C)] 98.5 F (36.9 C) (07/05 0700) Pulse Rate:  [60-70] 61 (07/05 0700) Resp:  [14-17] 16 (07/05 0700) BP: (120-143)/(72-78) 120/78 (07/05 0700) SpO2:  [94 %-100 %] 96 % (07/05 0700)  Intake/Output from previous day: 07/04 0701 - 07/05 0700 In: 600 [P.O.:600] Out: 1000 [Urine:1000] Intake/Output this shift: No intake/output data recorded.  4/5 D and B bilaterally, 4+/5 HG bilaterally.  4+/5 leg strength bilaterally  Lab Results: Recent Labs    09/27/20 0829  WBC 12.1*  HGB 14.3  HCT 43.6  PLT 508*   BMET Recent Labs    09/27/20 0829  NA 138  K 3.4*  CL 101  CO2 26  GLUCOSE 137*  BUN 9  CREATININE 0.89  CALCIUM 9.2    Studies/Results: DG Cervical Spine 2-3 Views  Result Date: 09/27/2020 CLINICAL DATA:  Postop.  Neck surgery 09/14/2020 EXAM: CERVICAL SPINE - 2-3 VIEW COMPARISON:  CT and MRI earlier today. FINDINGS: Upright AP and lateral views of the cervical spine obtained. Skull base through C5-C6 are visualized, lower cervical spine obscured by overlapping osseous and soft tissue structures. Anterior fusion at C3-C4 with interbody spacer in place. No periprosthetic lucency. Small amount of gas anterior to the surgical hardware as seen on recent CT. There is associated prevertebral soft tissue thickening at this level. IMPRESSION: 1. Anterior fusion at C3-C4 with interbody spacer in place, no periprosthetic lucency. Small amount of gas anterior to the surgical hardware as seen on earlier CT. 2. Prevertebral soft tissue thickening at this level. Electronically Signed   By: Keith Rake M.D.   On: 09/27/2020 19:24   CT CERVICAL SPINE W WO CONTRAST  Result Date: 09/27/2020 CLINICAL DATA:  Neck pain.  Recent surgery. EXAM: CT CERVICAL SPINE WITH AND WITHOUT CONTRAST TECHNIQUE: Multidetector CT  imaging of the cervical spine was performed before and during intravenous contrast administration. Multiplanar CT image reconstructions were also generated. CONTRAST:  156mL OMNIPAQUE IOHEXOL 300 MG/ML  SOLN COMPARISON:  None. FINDINGS: Alignment: Normal. Skull base and vertebrae: C3-4 ACDF. There is a small amount of gas anterior to the surgical hardware. No acute fracture. No evidence for discitis-osteomyelitis. Soft tissues and spinal canal: Small amount of gas and fluid anterior to the ACDF hardware. Disc levels:  Disc levels are better assessed on the earlier MRI. Upper chest: Negative. Other: There is no abnormal contrast enhancement visible at the operative site. IMPRESSION: 1. Status post C3-4 ACDF with small amount of gas and fluid anterior to the surgical hardware. No specific features to indicate infection over postoperative change, though both remain possibilities. Electronically Signed   By: Ulyses Jarred M.D.   On: 09/27/2020 19:06   MR LUMBAR SPINE WO CONTRAST  Result Date: 09/27/2020 CLINICAL DATA:  Low back pain EXAM: MRI LUMBAR SPINE WITHOUT CONTRAST TECHNIQUE: Multiplanar, multisequence MR imaging of the lumbar spine was performed. No intravenous contrast was administered. COMPARISON:  10/04/2019 FINDINGS: Segmentation:  Standard. Alignment:  No significant listhesis. Vertebrae: Interval postoperative changes at L3-L5 with rods and pedicle screws and interbody spacers. The hardware is not well evaluated and there is associated susceptibility artifact. There are laminectomies from L2-L3 through L5-S1. Conus medullaris and cauda equina: Conus extends to the L1 level. Conus and cauda equina appear normal. Paraspinal and other soft tissues: Postoperative changes. Disc levels: Congenital narrowing of the spinal canal. L1-L2:  No significant canal or foraminal stenosis. L2-L3:  No significant canal or foraminal stenosis. L3-L4: Interval fusion. There is low signal within the ventral epidural fat  likely reflecting protruding spacer as seen on recent radiograph. Decreased canal stenosis. There is artifact distorting the foramina without apparent significant stenosis. L4-L5: Interval fusion. Low signal within the prominent ventral epidural fat likely reflecting protruding spacer as seen on recent radiograph. Less likely residual/recurrent disc herniation. There is increased, still marked canal stenosis. Foramina are distorted by artifact with persistent but decreased stenosis bilaterally. L5-S1: Left greater than right facet arthropathy. Prominent ventral epidural fat partially effacing the thecal sac. No right foraminal stenosis. Minor left foraminal stenosis. Appearance is similar. IMPRESSION: Degenerative and postoperative changes as detailed above. There is decreased canal stenosis at L3-L4. Low signal within prominent ventral epidural fat at L4-L5 may reflect protruding spacer as seen on recent radiograph and less likely component of residual/recurrent disc herniation. There is increased, still marked canal stenosis. Electronically Signed   By: Macy Mis M.D.   On: 09/27/2020 16:16   MR Cervical Spine W or Wo Contrast  Result Date: 09/27/2020 CLINICAL DATA:  Bilateral arm and hand tingling and swelling. C3-4 ACDF 09/14/2020. EXAM: MRI CERVICAL SPINE WITHOUT AND WITH CONTRAST TECHNIQUE: Multiplanar and multiecho pulse sequences of the cervical spine, to include the craniocervical junction and cervicothoracic junction, were obtained without and with intravenous contrast. CONTRAST:  40mL GADAVIST GADOBUTROL 1 MMOL/ML IV SOLN COMPARISON:  05/21/2020 FINDINGS: Despite efforts by the technologist and patient, motion artifact is present on today's exam and could not be eliminated. This reduces exam sensitivity and specificity. Alignment: Mild reversal the normal cervical lordosis, no subluxation. Vertebrae: Prevertebral edema extends from about C1 through the C6 level. Metal artifact related to the plate  and screw fixator noted at C3-4. There is vertebral edema at both of these levels. Cord: Abnormal accentuated cord signal at the C3 and C4 levels. This is similar to the 05/21/2020 exam and probably represents myelomalacia and/or cord edema, although direct comparison is are difficult given that the degree of motion artifact on today's exam complicates comparison and blurs the cord. No definite new cord lesions identified. Posterior Fossa, vertebral arteries, paraspinal tissues: Prevertebral edema as noted above, not unexpected in the postoperative setting. Disc levels: The various levels of central and foraminal impingement at the non operative levels are unchanged from the 05/21/2020 exam. At the C3-4 operative level, there is wedge-shaped accentuated T2 signal in the vicinity of the original intervertebral space bulging posteriorly to narrow the AP diameter of the thecal sac to about 4.5 mm on image 8 series 1. On post-contrast image 9 series 6 this appears to have rim enhancement and a small amount of central hypoenhancement for example image 9 series 6. Low-grade enhancement along the anterior epidural space extending from the lower C2 level through C4 as shown on images 7 through 10 of series 6, although not really unexpected in the postoperative setting, and not demonstrating a nonenhancing component to suggest overt epidural abscess. With regard to the contents of this posteriorly bulging process at C3-4, this could well represent granulation tissue with a small amount of blood products centrally; strictly speaking, given the rim enhancing appearance, imaging does not confidently exclude the possibility of infection. IMPRESSION: 1. C3-4 ACDF 2 weeks ago, with rim enhancement and a small focus of central hypoenhancement at the C3-4 discectomy site bulging somewhat posteriorly to follow the contour of the previous spurring and chronic disc bulge at this  level, with substantial narrowing of the AP diameter of  the thecal sac similar to previous. Possibilities include granulation tissue with a small amount of central blood products/hypoenhancement, versus early infection. There is some enhancement along the adjacent epidural space and prevertebral edema which are not necessarily unexpected in this postoperative setting, but also raise some concern for potential infection. Roughly stable cord edema/myelomalacia at this level compared to the preoperative exam. Because of the potential overlap between postoperative findings and infection and the lack of pathognomonic findings of infection, increased reliance on clinical presentation and other clinical signs is suggested. I would also recommend conventional radiographs of the cervical spine for complimentary assessment of the hardware. 2. The various levels of impingement at the non operative levels are unchanged from 05/21/2020. Electronically Signed   By: Van Clines M.D.   On: 09/27/2020 16:45    Assessment/Plan: S/p C3-4 ACDF with persistent cervical myelopathy that has improved with steroid treatment.  His imaging shows expected postoperative changes - cont PT/OT; likely CIR  Vallarie Mare 09/29/2020, 11:06 AM

## 2020-09-29 NOTE — Progress Notes (Signed)
Inpatient Rehab Admissions Coordinator:   Consult received.  Working remotely and attempted to call into patients room but no answer.  Will try again this afternoon.   Shann Medal, PT, DPT Admissions Coordinator [phone] 09/29/20  11:51 AM

## 2020-09-29 NOTE — Progress Notes (Signed)
Inpatient Rehab Admissions Coordinator:   I was able to reach pt over the phone.  I explained estimated length of stay about 2 weeks, dependent on progress, and goals of supervision to occasional min assist.  He is motivated to return to prior level of function and agreeable to start insurance authorization process.  I let him know that [facility] Medicare would have to give prior authorization for me to admit him to CIR and they would not approve SNF following a CIR admission.  He is agreeable and states his significant other, [name], will be able to provide adequate assist.  I spoke to [place] over the phone and she confirms she will be able to help at discharge.  I reviewed all of the above with her as well.  I will start insurance auth today.   Shann Medal, PT, DPT Admissions Coordinator [phone] 09/29/20  3:37 PM

## 2020-09-30 ENCOUNTER — Encounter (HOSPITAL_COMMUNITY): Payer: Self-pay | Admitting: Neurosurgery

## 2020-09-30 ENCOUNTER — Inpatient Hospital Stay (HOSPITAL_COMMUNITY)
Admission: RE | Admit: 2020-09-30 | Discharge: 2020-10-01 | DRG: 561 | Disposition: A | Discharge status: Home / Self Care | Payer: Medicare HMO | Source: Intra-hospital | Attending: Physical Medicine and Rehabilitation | Admitting: Physical Medicine and Rehabilitation

## 2020-09-30 ENCOUNTER — Other Ambulatory Visit: Payer: Self-pay

## 2020-09-30 DIAGNOSIS — Z7984 Long term (current) use of oral hypoglycemic drugs: Secondary | ICD-10-CM | POA: Diagnosis not present

## 2020-09-30 DIAGNOSIS — I10 Essential (primary) hypertension: Secondary | ICD-10-CM | POA: Diagnosis not present

## 2020-09-30 DIAGNOSIS — G2581 Restless legs syndrome: Secondary | ICD-10-CM | POA: Diagnosis not present

## 2020-09-30 DIAGNOSIS — Z8249 Family history of ischemic heart disease and other diseases of the circulatory system: Secondary | ICD-10-CM | POA: Diagnosis not present

## 2020-09-30 DIAGNOSIS — G894 Chronic pain syndrome: Secondary | ICD-10-CM | POA: Diagnosis present

## 2020-09-30 DIAGNOSIS — Z833 Family history of diabetes mellitus: Secondary | ICD-10-CM | POA: Diagnosis not present

## 2020-09-30 DIAGNOSIS — M109 Gout, unspecified: Secondary | ICD-10-CM | POA: Diagnosis present

## 2020-09-30 DIAGNOSIS — Z4789 Encounter for other orthopedic aftercare: Principal | ICD-10-CM

## 2020-09-30 DIAGNOSIS — Z7982 Long term (current) use of aspirin: Secondary | ICD-10-CM

## 2020-09-30 DIAGNOSIS — M5000 Cervical disc disorder with myelopathy, unspecified cervical region: Secondary | ICD-10-CM

## 2020-09-30 DIAGNOSIS — Z79899 Other long term (current) drug therapy: Secondary | ICD-10-CM | POA: Diagnosis not present

## 2020-09-30 DIAGNOSIS — F32A Depression, unspecified: Secondary | ICD-10-CM | POA: Diagnosis not present

## 2020-09-30 DIAGNOSIS — M25561 Pain in right knee: Secondary | ICD-10-CM | POA: Diagnosis present

## 2020-09-30 DIAGNOSIS — E876 Hypokalemia: Secondary | ICD-10-CM | POA: Diagnosis not present

## 2020-09-30 DIAGNOSIS — M545 Low back pain, unspecified: Secondary | ICD-10-CM | POA: Diagnosis present

## 2020-09-30 DIAGNOSIS — F419 Anxiety disorder, unspecified: Secondary | ICD-10-CM | POA: Diagnosis present

## 2020-09-30 DIAGNOSIS — E119 Type 2 diabetes mellitus without complications: Secondary | ICD-10-CM

## 2020-09-30 DIAGNOSIS — K219 Gastro-esophageal reflux disease without esophagitis: Secondary | ICD-10-CM | POA: Diagnosis present

## 2020-09-30 DIAGNOSIS — E781 Pure hyperglyceridemia: Secondary | ICD-10-CM | POA: Diagnosis present

## 2020-09-30 DIAGNOSIS — G8929 Other chronic pain: Secondary | ICD-10-CM | POA: Diagnosis present

## 2020-09-30 DIAGNOSIS — F17210 Nicotine dependence, cigarettes, uncomplicated: Secondary | ICD-10-CM | POA: Diagnosis present

## 2020-09-30 DIAGNOSIS — Z800 Family history of malignant neoplasm of digestive organs: Secondary | ICD-10-CM

## 2020-09-30 DIAGNOSIS — D72829 Elevated white blood cell count, unspecified: Secondary | ICD-10-CM | POA: Diagnosis not present

## 2020-09-30 DIAGNOSIS — E1140 Type 2 diabetes mellitus with diabetic neuropathy, unspecified: Secondary | ICD-10-CM | POA: Diagnosis not present

## 2020-09-30 DIAGNOSIS — M62838 Other muscle spasm: Secondary | ICD-10-CM | POA: Diagnosis present

## 2020-09-30 LAB — GLUCOSE, CAPILLARY
Glucose-Capillary: 148 mg/dL — ABNORMAL HIGH (ref 70–99)
Glucose-Capillary: 169 mg/dL — ABNORMAL HIGH (ref 70–99)
Glucose-Capillary: 309 mg/dL — ABNORMAL HIGH (ref 70–99)
Glucose-Capillary: 162 mg/dL — ABNORMAL HIGH (ref 70–99)

## 2020-09-30 MED ORDER — CYCLOBENZAPRINE HCL 5 MG PO TABS: 5.0000 mg | ORAL_TABLET | Freq: Three times a day (TID) | ORAL | Status: DC | PRN

## 2020-09-30 MED ORDER — POTASSIUM CHLORIDE CRYS ER 20 MEQ PO TBCR: 20.0000 meq | EXTENDED_RELEASE_TABLET | Freq: Every day | ORAL | Status: DC

## 2020-09-30 MED ORDER — PROCHLORPERAZINE MALEATE 5 MG PO TABS: 5.0000 mg | ORAL_TABLET | Freq: Four times a day (QID) | ORAL | Status: DC | PRN

## 2020-09-30 MED ORDER — OXYCODONE-ACETAMINOPHEN 5-325 MG PO TABS: 1.0000 | ORAL_TABLET | ORAL | Status: DC | PRN

## 2020-09-30 MED ORDER — PROCHLORPERAZINE 25 MG RE SUPP: 12.5000 mg | Freq: Four times a day (QID) | RECTAL | Status: DC | PRN

## 2020-09-30 MED ORDER — FUROSEMIDE 40 MG PO TABS
40.0000 mg | ORAL_TABLET | Freq: Every day | ORAL | Status: DC
  Filled 2020-09-30: qty 1

## 2020-09-30 MED ORDER — TRAMADOL HCL 50 MG PO TABS: 50.0000 mg | ORAL_TABLET | Freq: Four times a day (QID) | ORAL | Status: DC | PRN

## 2020-09-30 MED ORDER — METHOCARBAMOL 750 MG PO TABS
750.0000 mg | ORAL_TABLET | Freq: Four times a day (QID) | ORAL | Status: DC | PRN
  Administered 2020-10-01: 750 mg via ORAL
  Filled 2020-09-30: qty 1

## 2020-09-30 MED ORDER — PROCHLORPERAZINE EDISYLATE 10 MG/2ML IJ SOLN: 5.0000 mg | Freq: Four times a day (QID) | INTRAMUSCULAR | Status: DC | PRN

## 2020-09-30 MED ORDER — VITAMIN B-12 1000 MCG PO TABS
1000.0000 ug | ORAL_TABLET | Freq: Every day | ORAL | Status: DC
  Administered 2020-10-01: 1000 ug via ORAL
  Filled 2020-09-30: qty 1

## 2020-09-30 MED ORDER — FLEET ENEMA 7-19 GM/118ML RE ENEM: 1.0000 | ENEMA | Freq: Once | RECTAL | Status: DC | PRN

## 2020-09-30 MED ORDER — TRAZODONE HCL 50 MG PO TABS: 25.0000 mg | ORAL_TABLET | Freq: Every evening | ORAL | Status: DC | PRN

## 2020-09-30 MED ORDER — DEXAMETHASONE 2 MG PO TABS: 2.0000 mg | ORAL_TABLET | Freq: Three times a day (TID) | ORAL | Status: DC

## 2020-09-30 MED ORDER — POTASSIUM CHLORIDE CRYS ER 20 MEQ PO TBCR
20.0000 meq | EXTENDED_RELEASE_TABLET | Freq: Two times a day (BID) | ORAL | Status: DC
  Administered 2020-09-30 – 2020-10-01 (×2): 20 meq via ORAL
  Filled 2020-09-30 (×2): qty 1

## 2020-09-30 MED ORDER — DEXAMETHASONE 2 MG PO TABS: 1.0000 mg | ORAL_TABLET | Freq: Every day | ORAL | Status: DC

## 2020-09-30 MED ORDER — GABAPENTIN 300 MG PO CAPS
900.0000 mg | ORAL_CAPSULE | Freq: Three times a day (TID) | ORAL | Status: DC
  Administered 2020-09-30 – 2020-10-01 (×2): 900 mg via ORAL
  Filled 2020-09-30 (×2): qty 3

## 2020-09-30 MED ORDER — LORATADINE 10 MG PO TABS: 10.0000 mg | ORAL_TABLET | Freq: Every day | ORAL | Status: DC | PRN

## 2020-09-30 MED ORDER — MAGNESIUM OXIDE -MG SUPPLEMENT 400 (240 MG) MG PO TABS
400.0000 mg | ORAL_TABLET | Freq: Every day | ORAL | Status: DC
  Administered 2020-10-01: 400 mg via ORAL
  Filled 2020-09-30: qty 1

## 2020-09-30 MED ORDER — ROSUVASTATIN CALCIUM 20 MG PO TABS
20.0000 mg | ORAL_TABLET | Freq: Every day | ORAL | Status: DC
  Administered 2020-10-01: 20 mg via ORAL
  Filled 2020-09-30: qty 1

## 2020-09-30 MED ORDER — ENOXAPARIN SODIUM 40 MG/0.4ML IJ SOSY: 40.0000 mg | PREFILLED_SYRINGE | INTRAMUSCULAR | Status: DC

## 2020-09-30 MED ORDER — DEXAMETHASONE 4 MG PO TABS
4.0000 mg | ORAL_TABLET | Freq: Three times a day (TID) | ORAL | Status: DC
  Administered 2020-09-30 – 2020-10-01 (×2): 4 mg via ORAL
  Filled 2020-09-30 (×2): qty 1

## 2020-09-30 MED ORDER — VITAMIN D (ERGOCALCIFEROL) 1.25 MG (50000 UNIT) PO CAPS
50000.0000 [IU] | ORAL_CAPSULE | ORAL | Status: DC
  Administered 2020-10-01: 50000 [IU] via ORAL
  Filled 2020-09-30 (×2): qty 1

## 2020-09-30 MED ORDER — DICLOFENAC SODIUM 1 % EX GEL
2.0000 g | Freq: Four times a day (QID) | CUTANEOUS | Status: DC
  Administered 2020-09-30: 2 g via TOPICAL
  Filled 2020-09-30: qty 100

## 2020-09-30 MED ORDER — INSULIN ASPART 100 UNIT/ML IJ SOLN
0.0000 [IU] | Freq: Three times a day (TID) | INTRAMUSCULAR | Status: DC
  Administered 2020-09-30: 3 [IU] via SUBCUTANEOUS
  Administered 2020-10-01: 4 [IU] via SUBCUTANEOUS

## 2020-09-30 MED ORDER — OXYCODONE HCL 5 MG PO TABS
10.0000 mg | ORAL_TABLET | ORAL | Status: DC | PRN
  Administered 2020-09-30 – 2020-10-01 (×2): 10 mg via ORAL
  Filled 2020-09-30 (×2): qty 2

## 2020-09-30 MED ORDER — METFORMIN HCL ER 500 MG PO TB24
1000.0000 mg | ORAL_TABLET | Freq: Every day | ORAL | Status: DC
  Filled 2020-09-30: qty 2

## 2020-09-30 MED ORDER — AMLODIPINE BESYLATE 10 MG PO TABS
10.0000 mg | ORAL_TABLET | Freq: Every day | ORAL | Status: DC
  Administered 2020-10-01: 10 mg via ORAL
  Filled 2020-09-30: qty 1

## 2020-09-30 MED ORDER — ACETAMINOPHEN 325 MG PO TABS: 325.0000 mg | ORAL_TABLET | ORAL | Status: DC | PRN

## 2020-09-30 MED ORDER — POLYETHYLENE GLYCOL 3350 17 G PO PACK: 17.0000 g | PACK | Freq: Every day | ORAL | Status: DC | PRN

## 2020-09-30 MED ORDER — FLUTICASONE PROPIONATE 50 MCG/ACT NA SUSP
2.0000 | Freq: Every day | NASAL | Status: DC
  Filled 2020-09-30: qty 16

## 2020-09-30 MED ORDER — DEXAMETHASONE 2 MG PO TABS: 2.0000 mg | ORAL_TABLET | Freq: Two times a day (BID) | ORAL | Status: DC

## 2020-09-30 MED ORDER — LIDOCAINE 5 % EX PTCH
1.0000 | MEDICATED_PATCH | CUTANEOUS | Status: DC
  Administered 2020-09-30: 1 via TRANSDERMAL
  Filled 2020-09-30: qty 1

## 2020-09-30 MED ORDER — ASCORBIC ACID 500 MG PO TABS
500.0000 mg | ORAL_TABLET | Freq: Every day | ORAL | Status: DC
  Administered 2020-10-01: 500 mg via ORAL
  Filled 2020-09-30: qty 1

## 2020-09-30 MED ORDER — ASPIRIN EC 81 MG PO TBEC
81.0000 mg | DELAYED_RELEASE_TABLET | Freq: Every day | ORAL | Status: DC
  Administered 2020-10-01: 81 mg via ORAL
  Filled 2020-09-30: qty 1

## 2020-09-30 MED ORDER — BISACODYL 10 MG RE SUPP: 10.0000 mg | Freq: Every day | RECTAL | Status: DC | PRN

## 2020-09-30 MED ORDER — ADULT MULTIVITAMIN W/MINERALS CH
1.0000 | ORAL_TABLET | Freq: Every day | ORAL | Status: DC
  Administered 2020-10-01: 1 via ORAL
  Filled 2020-09-30: qty 1

## 2020-09-30 MED ORDER — DOCUSATE SODIUM 100 MG PO CAPS
100.0000 mg | ORAL_CAPSULE | Freq: Two times a day (BID) | ORAL | Status: DC
  Administered 2020-09-30 – 2020-10-01 (×2): 100 mg via ORAL
  Filled 2020-09-30 (×2): qty 1

## 2020-09-30 MED ORDER — METHOCARBAMOL 500 MG PO TABS: 500.0000 mg | ORAL_TABLET | Freq: Four times a day (QID) | ORAL | Status: DC | PRN

## 2020-09-30 MED ORDER — ALUM & MAG HYDROXIDE-SIMETH 200-200-20 MG/5ML PO SUSP: 30.0000 mL | ORAL | Status: DC | PRN

## 2020-09-30 MED ORDER — GUAIFENESIN-DM 100-10 MG/5ML PO SYRP: 5.0000 mL | ORAL_SOLUTION | Freq: Four times a day (QID) | ORAL | Status: DC | PRN

## 2020-09-30 MED ORDER — FENOFIBRATE 160 MG PO TABS
160.0000 mg | ORAL_TABLET | Freq: Every day | ORAL | Status: DC
  Administered 2020-10-01: 160 mg via ORAL
  Filled 2020-09-30: qty 1

## 2020-09-30 MED ORDER — DEXAMETHASONE 2 MG PO TABS: 1.0000 mg | ORAL_TABLET | Freq: Two times a day (BID) | ORAL | Status: DC

## 2020-09-30 MED ORDER — INSULIN ASPART 100 UNIT/ML IJ SOLN: 0.0000 [IU] | Freq: Every day | INTRAMUSCULAR | Status: DC

## 2020-09-30 MED ORDER — ZINC SULFATE 220 (50 ZN) MG PO CAPS
220.0000 mg | ORAL_CAPSULE | Freq: Every day | ORAL | Status: DC
  Administered 2020-10-01: 220 mg via ORAL
  Filled 2020-09-30: qty 1

## 2020-09-30 MED ORDER — LISINOPRIL 20 MG PO TABS
40.0000 mg | ORAL_TABLET | Freq: Every day | ORAL | Status: DC
  Administered 2020-10-01: 40 mg via ORAL
  Filled 2020-09-30: qty 2

## 2020-09-30 MED ORDER — CITALOPRAM HYDROBROMIDE 20 MG PO TABS
40.0000 mg | ORAL_TABLET | Freq: Every day | ORAL | Status: DC
  Administered 2020-10-01: 40 mg via ORAL
  Filled 2020-09-30: qty 2

## 2020-09-30 NOTE — H&P (Signed)
Physical Medicine and Rehabilitation Admission H&P    CC:  Functional deficits.    HPI: Harold [name] is a 55 year old male with history of HTN, hypertriglyceridemia, T2DM, neuropathy, DDD s/p lumbar decompression, tingling and weakness in hands with difficulty walking who was found to have cervical myelopathy due to C3/4 HNP with cord compression. He was admitted on 09/14/20 for outpatient ACDP C3-C4 by Dr. Saintclair Halsted.  His symptoms improved transiently but he was readmitted on 09/27/20 with reports of back pain, numbness and swelling of hands, difficulty walking since fall on 09/22/20. He continued to have multiple falls and was admitted for work up.  MRI brain negative. MRI C spine showed ACDF C3/4 with rim enhancement and small focus of central hypoenhancement question granulation tissue with central blood products v/s early infection. WBC noted to be mildly elevated at 12.1 but ESR  10- WNL. He was started on decadron taper with improvement in symptoms but continues to have functional limitations. CIR recommended due to functional decline. Complains of back and neck pain.   Review of Systems  Constitutional:  Negative for chills and fever.  HENT:  Negative for hearing loss and tinnitus.   Eyes:  Negative for blurred vision and double vision.  Respiratory:  Negative for cough and shortness of breath.   Cardiovascular:  Negative for chest pain, palpitations and leg swelling.  Musculoskeletal:  Positive for back pain, joint pain (right since fall), myalgias and neck pain.  Skin:  Negative for itching and rash.  Neurological:  Positive for sensory change (hands feel numb and swollen). Negative for dizziness and headaches.  Psychiatric/Behavioral:  The patient is not nervous/anxious.     Past Medical History:  Diagnosis Date   Acute idiopathic pericarditis 04/08/2010   Qualifier: Diagnosis of  By: [name], [name]     Acute sinusitis 06/04/2014   Anxiety    CHF (congestive heart failure)  ([place]) 06/29/2010   was pericardititis not CHF   Chronic back pain    Constipation    DDD (degenerative disc disease), lumbar 09/14/2012   Depression    Erectile dysfunction 09/14/2012   GERD (gastroesophageal reflux disease)    ocassional   Gout, unspecified 05/01/2007   Qualifier: Diagnosis of  By: [name] MD, Dellis Filbert A    Headache    migraines in the past   History of pancreatitis    a. admx 04-2009.Marland KitchenMarland Kitchen? 2-2 triglycerides   Hypertension    Hypertriglyceridemia    a. followed by [facility]   Left sided sciatica 09/14/2012   Neuropathy    Obesity    OSA (obstructive sleep apnea)    CPAP- not current   Restless legs    Type 2 diabetes mellitus with diabetic neuropathy, unspecified ([place]) 09/20/2019    Past Surgical History:  Procedure Laterality Date   ANTERIOR CERVICAL DECOMP/DISCECTOMY FUSION N/A 09/14/2020   Procedure: Anterior Cervical Decompression Fusion - Cervical three-Cervical four;  Surgeon: Kary Kos, MD;  Location: [place];  Service: Neurosurgery;  Laterality: N/A;   COLONOSCOPY     KNEE SURGERY Right    x3 scopes   KNEE SURGERY Right    open menisicus repair   LUMBAR LAMINECTOMY/DECOMPRESSION MICRODISCECTOMY N/A 08/26/2015   Procedure: Lumbar Two-Sacral One  Laminectomy for decompression;  Surgeon: [name], MD;  Location: [place] [facility];  Service: Neurosurgery;  Laterality: N/A;   pericardectomy  07/15/2010   Dr. [name]   pleurx catheter placement  07/15/2010   Dr. [name]  SHOULDER SURGERY     right and left shoulders- Rotator cuff repair   TONSILLECTOMY AND ADENOIDECTOMY     one tonsil    Family History  Problem Relation Age of Onset   Diabetes Mother    Diabetes Father    Colon cancer Father 60   Heart disease Father 66       CABG   Pancreatitis Father    Hypertension Other        entire family   Liver cancer Paternal Uncle    Diabetes Sister    Stomach cancer Maternal Grandfather    Pancreatitis Paternal Uncle        x 2   Esophageal  cancer Neg Hx    Rectal cancer Neg Hx    Colon polyps Neg Hx     Social History: Lives with finace. Has been disabled since 2007 due to back/knee issues. He  reports that he has been smoking cigarettes. He has a 11.88 pack-year smoking history. He has never used smokeless tobacco. He reports current alcohol use. He reports that he does not use drugs.   Allergies  Allergen Reactions   Other Itching, Swelling and Palpitations    Pecans: itching and swelling of the tongue    Diphenhydramine Itching, Palpitations and Other (See Comments)    "jittery" Pt states he can take Benadryl in an emergency situation   Pecan Nut (Diagnostic)    Colchicine Diarrhea   Lipitor [Atorvastatin] Other (See Comments)    Muscle cramps   Septra [Sulfamethoxazole-Trimethoprim] Itching    Medications Prior to Admission  Medication Sig Dispense Refill   amLODipine (NORVASC) 10 MG tablet TAKE 1 TABLET(10 MG) BY MOUTH DAILY 30 tablet 0   ascorbic acid (VITAMIN C) 500 MG tablet Take 500 mg by mouth daily.     aspirin EC 81 MG tablet Take 81 mg by mouth daily.     Blood Glucose Monitoring Suppl (ONE TOUCH ULTRA 2) w/Device KIT Use as directed daily E11.9 1 each 0   citalopram (CELEXA) 40 MG tablet Take 1 tablet (40 mg total) by mouth daily. 90 tablet 1   diclofenac Sodium (VOLTAREN) 1 % GEL Apply 1 application topically 3 (three) times daily as needed (PAIN).      fenofibrate (TRICOR) 145 MG tablet Take 1 tablet (145 mg total) by mouth daily. 90 tablet 1   fluticasone (FLONASE) 50 MCG/ACT nasal spray Place 2 sprays into both nostrils daily. 16 g 6   furosemide (LASIX) 40 MG tablet Take 1 tablet (40 mg total) by mouth daily. 90 tablet 3   gabapentin (NEURONTIN) 300 MG capsule Take 900 mg by mouth 3 (three) times daily.     glucose blood test strip Use as instructed once daily E119 100 each 12   ibuprofen (ADVIL) 800 MG tablet Take 800 mg by mouth 3 (three) times daily.     Lancets MISC Use as directed daily E11.9  100 each 11   lisinopril (ZESTRIL) 40 MG tablet TAKE ONE TABLET BY MOUTH DAILY 90 tablet 2   loratadine (CLARITIN) 10 MG tablet Take 1 tablet (10 mg total) by mouth daily as needed for allergies. 90 tablet 3   magnesium oxide (MAG-OX) 400 MG tablet Take 400 mg by mouth daily.     metFORMIN (GLUCOPHAGE-XR) 500 MG 24 hr tablet TAKE TWO TABLETS BY MOUTH DAILY WITH BREAKFAST 180 tablet 3   methocarbamol (ROBAXIN) 500 MG tablet Take 500 mg by mouth 4 (four) times daily as needed for muscle  spasms.     Multiple Minerals-Vitamins (CALCIUM-MAGNESIUM-ZINC-D3) TABS Take 1 tablet by mouth daily.     oxyCODONE-acetaminophen (PERCOCET) 10-325 MG tablet Take 1 tablet by mouth every 6 (six) hours as needed for pain. 30 tablet 0   Potassium Chloride ER 20 MEQ TBCR TAKE ONE TABLET BY MOUTH DAILY 90 tablet 2   rosuvastatin (CRESTOR) 20 MG tablet TAKE 1 TABLET(20 MG) BY MOUTH DAILY 30 tablet 0   sildenafil (VIAGRA) 100 MG tablet Take 0.5-1 tablets (50-100 mg total) by mouth daily as needed for erectile dysfunction. 5 tablet 11   tiZANidine (ZANAFLEX) 4 MG tablet Take 4 mg by mouth 3 (three) times daily as needed for muscle spasms.     vitamin B-12 (CYANOCOBALAMIN) 1000 MCG tablet Take 1 tablet (1,000 mcg total) by mouth daily. 90 tablet 1   zinc gluconate 50 MG tablet Take 50 mg by mouth daily.      Drug Regimen Review  Drug regimen was reviewed and remains appropriate with no significant issues identified  Home: Home Living Family/patient expects to be discharged to:: Private residence Living Arrangements: Spouse/significant other   Functional History:    Functional Status:  Mobility:          ADL:    Cognition: Cognition Orientation Level: Oriented X4     Blood pressure (!) 151/94, pulse (!) 52, temperature 98.7 F (37.1 C), temperature source Oral, resp. rate 16, height '5\' 11"'  (1.803 m), weight 112.2 kg, SpO2 97 %.  Physical Exam Constitutional:      Appearance: Normal appearance.     Comments: Sedated appearing  HEENT: [place], AT Cardiology: RRR, no MRG Pulmonary:    Effort: Pulmonary effort is normal.    Breath sounds: Normal breath sounds. Abdomen: obese, NT Musculoskeletal:        General: No swelling or tenderness. Neurological:    Mental Status: He is alert and oriented to person, place, and time. 5/5 strength throughout. Decreased sensation along full volar aspects of bilateral hands  Results for orders placed or performed during the hospital encounter of 09/27/20 (from the past 48 hour(s))  Glucose, capillary     Status: Abnormal   Collection Time: 09/28/20 10:15 PM  Result Value Ref Range   Glucose-Capillary 240 (H) 70 - 99 mg/dL    Comment: Glucose reference range applies only to samples taken after fasting for at least 8 hours.  Glucose, capillary     Status: Abnormal   Collection Time: 09/29/20  6:02 AM  Result Value Ref Range   Glucose-Capillary 170 (H) 70 - 99 mg/dL    Comment: Glucose reference range applies only to samples taken after fasting for at least 8 hours.  Glucose, capillary     Status: Abnormal   Collection Time: 09/29/20 11:32 AM  Result Value Ref Range   Glucose-Capillary 172 (H) 70 - 99 mg/dL    Comment: Glucose reference range applies only to samples taken after fasting for at least 8 hours.  Glucose, capillary     Status: Abnormal   Collection Time: 09/29/20  4:59 PM  Result Value Ref Range   Glucose-Capillary 257 (H) 70 - 99 mg/dL    Comment: Glucose reference range applies only to samples taken after fasting for at least 8 hours.  Glucose, capillary     Status: Abnormal   Collection Time: 09/29/20 10:00 PM  Result Value Ref Range   Glucose-Capillary 175 (H) 70 - 99 mg/dL    Comment: Glucose reference range applies only to samples taken  after fasting for at least 8 hours.  Glucose, capillary     Status: Abnormal   Collection Time: 09/30/20  6:29 AM  Result Value Ref Range   Glucose-Capillary 148 (H) 70 - 99 mg/dL    Comment:  Glucose reference range applies only to samples taken after fasting for at least 8 hours.  Glucose, capillary     Status: Abnormal   Collection Time: 09/30/20 11:09 AM  Result Value Ref Range   Glucose-Capillary 169 (H) 70 - 99 mg/dL    Comment: Glucose reference range applies only to samples taken after fasting for at least 8 hours.  Glucose, capillary     Status: Abnormal   Collection Time: 09/30/20  3:27 PM  Result Value Ref Range   Glucose-Capillary 309 (H) 70 - 99 mg/dL    Comment: Glucose reference range applies only to samples taken after fasting for at least 8 hours.   No results found.     Medical Problem List and Plan: 1.  Cervical myelopathy  -patient may shower but incision must be covered  -ELOS/Goals: 7-10 days modI 2.  Impaired mobility, SCI: continue Lovenox  -antiplatelet therapy: Low dose ASA 3. Chronic LBP/Pain Management: Was on percocet 10/325 mg qid with gabapentin 900 mg tid --Oxycodone prn--will increase to 15 mg as using dilaudid 0.5 mg tid prn. Add lidocaine patch 4. Mood: LCSW to follow for evaluation and suppor.t   -antipsychotic agents: N/A 5. Neuropsych: This patient is capable of making decisions on his own behalf. 6. Skin/Wound Care: Monitor wound for continued healing. Routine pressure relief measures.  7. Fluids/Electrolytes/Nutrition: Monitor I/O. Check CMET in am. 8. HTN: Monitor BP tid--continue Norvasc, lasix and Zestril 9. Hypokalemia: Increase supplement to bid due to lasix and decadron on board. 10. T2DM: Hgb A1c-6.6 (May '22).  Continue metformin  1000 mg/day --Monitor BS ac/hs and will use resistant scale as on steroids.  11. Peripheral neuropathy: Continue Gabapentin 12. H/o depression: Managed on Celexa.  13. Leucocytosis: Recheck CBC in am.  14. Right knee pain: ordered voltaren gel.   I have personally performed a face to face diagnostic evaluation, including, but not limited to relevant history and physical exam findings, of this  patient and developed relevant assessment and plan.  Additionally, I have reviewed and concur with the physician assistant's documentation above.  Leeroy Cha, MD  Izora Ribas, MD 09/30/2020

## 2020-09-30 NOTE — Progress Notes (Signed)
Occupational Therapy Treatment Patient Details Name: Harold [name] MRN: [id number] DOB: 11-25-1965 Today's Date: 09/30/2020    History of present illness 55 year old man who is 2 weeks status post C3-4 ACDF for severe cord impingement who has increased weakness postoperatively.  His imaging is most consistent with expected postoperative changes.  His ESR is normal.   OT comments  Pt. Was seen by OT to maximize Pt. I and safety with ADLs and mobility. Pt. Was able to sit EOB for LE ADLs using cross leg technique for dressing. Pt. Was able to maintain balance. Pt. Was Min a with sit to stand from bed and to side step to head of bed. Pt. To be followed by OT>   Follow Up Recommendations  Supervision/Assistance - 24 hour;CIR    Equipment Recommendations  None recommended by OT    Recommendations for Other Services      Precautions / Restrictions Precautions Precautions: Cervical Precaution Booklet Issued: No Precaution Comments: verbally reviewed all cervical precautions. Pt recalled 3/3. Required Braces or Orthoses: Cervical Brace Cervical Brace: Soft collar Restrictions Weight Bearing Restrictions: No       Mobility Bed Mobility Overal bed mobility: Needs Assistance Bed Mobility: Rolling;Sidelying to Sit   Sidelying to sit: Supervision     Sit to sidelying: Supervision General bed mobility comments: for safety, cues for technique and use of bedrail    Transfers       Sit to Stand: Min assist;From elevated surface         General transfer comment: Pt. was Min A to march in place and to side step up the head of bed. Pt. has a fear of falling.    Balance Overall balance assessment: History of Falls;Needs assistance   Sitting balance-Leahy Scale: Good       Standing balance-Leahy Scale: Fair                             ADL either performed or assessed with clinical judgement   ADL Overall ADL's : Needs assistance/impaired     Grooming:  Wash/dry hands;Wash/dry face;Oral care;Applying deodorant;Set up;Sitting           Upper Body Dressing : Set up;Sitting   Lower Body Dressing: Sit to/from stand;Moderate assistance   Toilet Transfer:  (Pt. refused to attempt to amb stated his legs would give out.)           Functional mobility during ADLs:  (Pt. was Min a with sit to stand from bed.) General ADL Comments: Pt. sat EOB for ADLs.     Vision   Vision Assessment?: No apparent visual deficits   Perception     Praxis      Cognition Arousal/Alertness: Awake/alert Behavior During Therapy: WFL for tasks assessed/performed Overall Cognitive Status: Within Functional Limits for tasks assessed                                 General Comments: Pt. was sleepy and stated he did not sleep well        Exercises     Shoulder Instructions       General Comments      Pertinent Vitals/ Pain       Pain Assessment: 0-10 Pain Score: 9  Pain Location: neck and back Pain Descriptors / Indicators: Sore;Aching Pain Intervention(s): Premedicated before session  Home Living  Prior Functioning/Environment              Frequency  Min 2X/week        Progress Toward Goals  OT Goals(current goals can now be found in the care plan section)  Progress towards OT goals: Progressing toward goals  Acute Rehab OT Goals Patient Stated Goal: get my legs stronger, stop falling OT Goal Formulation: With patient Time For Goal Achievement: 10/12/20 Potential to Achieve Goals: Good ADL Goals Pt Will Perform Upper Body Bathing: with set-up;sitting Pt Will Perform Lower Body Bathing: with supervision;sit to/from stand;with adaptive equipment Pt Will Perform Upper Body Dressing: with modified independence;sitting;standing Pt Will Perform Lower Body Dressing: with min guard assist;with adaptive equipment;sit to/from stand Pt Will Transfer to Toilet:  with min guard assist;ambulating Pt Will Perform Toileting - Clothing Manipulation and hygiene: with supervision;sit to/from stand  Plan Discharge plan remains appropriate    Co-evaluation                 AM-PAC OT "6 Clicks" Daily Activity     Outcome Measure   Help from another person eating meals?: A Little Help from another person taking care of personal grooming?: A Little Help from another person toileting, which includes using toliet, bedpan, or urinal?: A Lot Help from another person bathing (including washing, rinsing, drying)?: A Lot Help from another person to put on and taking off regular upper body clothing?: A Little Help from another person to put on and taking off regular lower body clothing?: A Lot 6 Click Score: 15    End of Session Equipment Utilized During Treatment: Cervical collar  OT Visit Diagnosis: Unsteadiness on feet (R26.81);Other abnormalities of gait and mobility (R26.89);Repeated falls (R29.6);Muscle weakness (generalized) (M62.81);History of falling (Z91.81);Pain;Hemiplegia and hemiparesis   Activity Tolerance Patient limited by pain   Patient Left in bed;with call bell/phone within reach;with bed alarm set   Nurse Communication  (ok therapy)        Time: 5631-4970 OT Time Calculation (min): 26 min  Charges: OT General Charges $OT Visit: 1 Visit OT Treatments $Self Care/Home Management : 8-22 mins $Therapeutic Activity: 8-22 mins  Harold [name] OT/L    Harold [name] 09/30/2020, 11:41 AM

## 2020-09-30 NOTE — Discharge Summary (Signed)
Physician Discharge Summary  Patient ID: Harold [name] MRN: [id number] DOB/AGE: 05/26/1965 55 y.o.  Admit date: 09/27/2020 Discharge date: 09/30/2020  Admission Diagnoses: worsening myelopathy post acdf   Discharge Diagnoses: worsening myelopathy post acdf   Discharged Condition: good  Hospital Course: The patient was admitted on 09/27/2020 with worsening myelopathy symptoms. The hospital course was routine. There were no complications. The wound remained clean dry and intact. Pt had appropriate neck soreness. No complaints of arm pain or new N/T/W. The patient remained afebrile with stable vital signs, and tolerated a regular diet. The patient got a little better with steroids. The patient continued to increase activities, and pain was well controlled with oral pain medications.   Consults: None  Significant Diagnostic Studies:  Results for orders placed or performed during the hospital encounter of 09/27/20  Resp Panel by RT-PCR (Flu A&B, Covid) Nasopharyngeal Swab   Specimen: Nasopharyngeal Swab; Nasopharyngeal(NP) swabs in vial transport medium  Result Value Ref Range   SARS Coronavirus 2 by RT PCR NEGATIVE NEGATIVE   Influenza A by PCR NEGATIVE NEGATIVE   Influenza B by PCR NEGATIVE NEGATIVE  CBC with Differential  Result Value Ref Range   WBC 12.1 (H) 4.0 - 10.5 K/uL   RBC 4.59 4.22 - 5.81 MIL/uL   Hemoglobin 14.3 13.0 - 17.0 g/dL   HCT 43.6 39.0 - 52.0 %   MCV 95.0 80.0 - 100.0 fL   MCH 31.2 26.0 - 34.0 pg   MCHC 32.8 30.0 - 36.0 g/dL   RDW 12.7 11.5 - 15.5 %   Platelets 508 (H) 150 - 400 K/uL   nRBC 0.0 0.0 - 0.2 %   Neutrophils Relative % 68 %   Neutro Abs 8.3 (H) 1.7 - 7.7 K/uL   Lymphocytes Relative 19 %   Lymphs Abs 2.2 0.7 - 4.0 K/uL   Monocytes Relative 9 %   Monocytes Absolute 1.1 (H) 0.1 - 1.0 K/uL   Eosinophils Relative 3 %   Eosinophils Absolute 0.4 0.0 - 0.5 K/uL   Basophils Relative 1 %   Basophils Absolute 0.1 0.0 - 0.1 K/uL   Immature Granulocytes 0  %   Abs Immature Granulocytes 0.04 0.00 - 0.07 K/uL  Comprehensive metabolic panel  Result Value Ref Range   Sodium 138 135 - 145 mmol/L   Potassium 3.4 (L) 3.5 - 5.1 mmol/L   Chloride 101 98 - 111 mmol/L   CO2 26 22 - 32 mmol/L   Glucose, Bld 137 (H) 70 - 99 mg/dL   BUN 9 6 - 20 mg/dL   Creatinine, Ser 0.89 0.61 - 1.24 mg/dL   Calcium 9.2 8.9 - 10.3 mg/dL   Total Protein 7.0 6.5 - 8.1 g/dL   Albumin 3.9 3.5 - 5.0 g/dL   AST 21 15 - 41 U/L   ALT 15 0 - 44 U/L   Alkaline Phosphatase 51 38 - 126 U/L   Total Bilirubin 0.6 0.3 - 1.2 mg/dL   GFR, Estimated >60 >60 mL/min   Anion gap 11 5 - 15  Brain natriuretic peptide  Result Value Ref Range   B Natriuretic Peptide 78.3 0.0 - 100.0 pg/mL  Sedimentation rate  Result Value Ref Range   Sed Rate 10 0 - 16 mm/hr  C-reactive protein  Result Value Ref Range   CRP 0.5 <1.0 mg/dL  Procalcitonin - Baseline  Result Value Ref Range   Procalcitonin <0.10 ng/mL  Glucose, capillary  Result Value Ref Range   Glucose-Capillary 128 (H) 70 -  99 mg/dL  Glucose, capillary  Result Value Ref Range   Glucose-Capillary 190 (H) 70 - 99 mg/dL  Glucose, capillary  Result Value Ref Range   Glucose-Capillary 174 (H) 70 - 99 mg/dL  Glucose, capillary  Result Value Ref Range   Glucose-Capillary 240 (H) 70 - 99 mg/dL  Glucose, capillary  Result Value Ref Range   Glucose-Capillary 170 (H) 70 - 99 mg/dL  Glucose, capillary  Result Value Ref Range   Glucose-Capillary 172 (H) 70 - 99 mg/dL  Glucose, capillary  Result Value Ref Range   Glucose-Capillary 257 (H) 70 - 99 mg/dL  Glucose, capillary  Result Value Ref Range   Glucose-Capillary 175 (H) 70 - 99 mg/dL  Glucose, capillary  Result Value Ref Range   Glucose-Capillary 148 (H) 70 - 99 mg/dL  Glucose, capillary  Result Value Ref Range   Glucose-Capillary 169 (H) 70 - 99 mg/dL  Troponin I (High Sensitivity)  Result Value Ref Range   Troponin I (High Sensitivity) 9 <18 ng/L  Troponin I (High  Sensitivity)  Result Value Ref Range   Troponin I (High Sensitivity) 8 <18 ng/L    DG Chest 2 View  Result Date: 09/27/2020 CLINICAL DATA:  Bilateral upper extremity swelling. EXAM: CHEST - 2 VIEW COMPARISON:  January 01, 2014. FINDINGS: Stable cardiomediastinal silhouette. Sternotomy wires are noted. Both lungs are clear. The visualized skeletal structures are unremarkable. IMPRESSION: No active cardiopulmonary disease. Electronically Signed   By: Marijo Conception M.D.   On: 09/27/2020 08:50   DG Cervical Spine 1 View  Result Date: 09/14/2020 CLINICAL DATA:  ACDF. EXAM: DG CERVICAL SPINE - 1 VIEW; DG C-ARM 1-60 MIN COMPARISON:  None. FINDINGS: Single intraoperative cross-table lateral view of the cervical spine is submitted. Occiput to C3-4 is visualized. Degenerative changes at C3-4. No hardware is visualized. IMPRESSION: Intraoperative visualization of the upper cervical spine. Electronically Signed   By: Lorin Picket M.D.   On: 09/14/2020 11:49   DG Cervical Spine 2-3 Views  Result Date: 09/27/2020 CLINICAL DATA:  Postop.  Neck surgery 09/14/2020 EXAM: CERVICAL SPINE - 2-3 VIEW COMPARISON:  CT and MRI earlier today. FINDINGS: Upright AP and lateral views of the cervical spine obtained. Skull base through C5-C6 are visualized, lower cervical spine obscured by overlapping osseous and soft tissue structures. Anterior fusion at C3-C4 with interbody spacer in place. No periprosthetic lucency. Small amount of gas anterior to the surgical hardware as seen on recent CT. There is associated prevertebral soft tissue thickening at this level. IMPRESSION: 1. Anterior fusion at C3-C4 with interbody spacer in place, no periprosthetic lucency. Small amount of gas anterior to the surgical hardware as seen on earlier CT. 2. Prevertebral soft tissue thickening at this level. Electronically Signed   By: Keith Rake M.D.   On: 09/27/2020 19:24   CT CERVICAL SPINE W WO CONTRAST  Result Date: 09/27/2020 CLINICAL  DATA:  Neck pain.  Recent surgery. EXAM: CT CERVICAL SPINE WITH AND WITHOUT CONTRAST TECHNIQUE: Multidetector CT imaging of the cervical spine was performed before and during intravenous contrast administration. Multiplanar CT image reconstructions were also generated. CONTRAST:  137m OMNIPAQUE IOHEXOL 300 MG/ML  SOLN COMPARISON:  None. FINDINGS: Alignment: Normal. Skull base and vertebrae: C3-4 ACDF. There is a small amount of gas anterior to the surgical hardware. No acute fracture. No evidence for discitis-osteomyelitis. Soft tissues and spinal canal: Small amount of gas and fluid anterior to the ACDF hardware. Disc levels:  Disc levels are better assessed on the  earlier MRI. Upper chest: Negative. Other: There is no abnormal contrast enhancement visible at the operative site. IMPRESSION: 1. Status post C3-4 ACDF with small amount of gas and fluid anterior to the surgical hardware. No specific features to indicate infection over postoperative change, though both remain possibilities. Electronically Signed   By: Ulyses Jarred M.D.   On: 09/27/2020 19:06   MR BRAIN WO CONTRAST  Result Date: 09/27/2020 CLINICAL DATA:  55 year old male status post cervical spine surgery on 09/14/2020. Bilateral arm and hand tingling and swelling. EXAM: MRI HEAD WITHOUT CONTRAST TECHNIQUE: Multiplanar, multiecho pulse sequences of the brain and surrounding structures were obtained without intravenous contrast. COMPARISON:  Preoperative cervical spine MRI 05/21/2020. FINDINGS: Brain: Normal cerebral volume. No restricted diffusion to suggest acute infarction. No midline shift, mass effect, evidence of mass lesion, ventriculomegaly, extra-axial collection or acute intracranial hemorrhage. Cervicomedullary junction and pituitary are within normal limits. Pearline Cables and white matter signal is within normal limits for age throughout the brain. No chronic cerebral blood products or convincing encephalomalacia. Vascular: Major intracranial  vascular flow voids are preserved. The right vertebral artery appears dominant. Skull and upper cervical spine: Partially visible cervical spine hardware artifact at C2-C3. Suggestion of increased upper cervical spinal stenosis at that level on series 9, image 13. Visualized bone marrow signal is within normal limits. Sinuses/Orbits: Negative orbits. Occasional trace paranasal sinus mucosal thickening or small retention cysts. Other: Mastoids are clear. Grossly normal visible internal auditory structures. Negative visible scalp and face. IMPRESSION: 1. Normal noncontrast MRI appearance of the brain. 2. Partially visible cervical spine hardware artifact with possible increased spinal stenosis in the upper cervical spine at C2-C3 since a February preoperative MRI. Dedicated Cervical Spine MRI may be valuable. Electronically Signed   By: Genevie Ann M.D.   On: 09/27/2020 10:14   MR LUMBAR SPINE WO CONTRAST  Result Date: 09/27/2020 CLINICAL DATA:  Low back pain EXAM: MRI LUMBAR SPINE WITHOUT CONTRAST TECHNIQUE: Multiplanar, multisequence MR imaging of the lumbar spine was performed. No intravenous contrast was administered. COMPARISON:  10/04/2019 FINDINGS: Segmentation:  Standard. Alignment:  No significant listhesis. Vertebrae: Interval postoperative changes at L3-L5 with rods and pedicle screws and interbody spacers. The hardware is not well evaluated and there is associated susceptibility artifact. There are laminectomies from L2-L3 through L5-S1. Conus medullaris and cauda equina: Conus extends to the L1 level. Conus and cauda equina appear normal. Paraspinal and other soft tissues: Postoperative changes. Disc levels: Congenital narrowing of the spinal canal. L1-L2:  No significant canal or foraminal stenosis. L2-L3:  No significant canal or foraminal stenosis. L3-L4: Interval fusion. There is low signal within the ventral epidural fat likely reflecting protruding spacer as seen on recent radiograph. Decreased canal  stenosis. There is artifact distorting the foramina without apparent significant stenosis. L4-L5: Interval fusion. Low signal within the prominent ventral epidural fat likely reflecting protruding spacer as seen on recent radiograph. Less likely residual/recurrent disc herniation. There is increased, still marked canal stenosis. Foramina are distorted by artifact with persistent but decreased stenosis bilaterally. L5-S1: Left greater than right facet arthropathy. Prominent ventral epidural fat partially effacing the thecal sac. No right foraminal stenosis. Minor left foraminal stenosis. Appearance is similar. IMPRESSION: Degenerative and postoperative changes as detailed above. There is decreased canal stenosis at L3-L4. Low signal within prominent ventral epidural fat at L4-L5 may reflect protruding spacer as seen on recent radiograph and less likely component of residual/recurrent disc herniation. There is increased, still marked canal stenosis. Electronically Signed   By:  Macy Mis M.D.   On: 09/27/2020 16:16   MR Cervical Spine W or Wo Contrast  Result Date: 09/27/2020 CLINICAL DATA:  Bilateral arm and hand tingling and swelling. C3-4 ACDF 09/14/2020. EXAM: MRI CERVICAL SPINE WITHOUT AND WITH CONTRAST TECHNIQUE: Multiplanar and multiecho pulse sequences of the cervical spine, to include the craniocervical junction and cervicothoracic junction, were obtained without and with intravenous contrast. CONTRAST:  21m GADAVIST GADOBUTROL 1 MMOL/ML IV SOLN COMPARISON:  05/21/2020 FINDINGS: Despite efforts by the technologist and patient, motion artifact is present on today's exam and could not be eliminated. This reduces exam sensitivity and specificity. Alignment: Mild reversal the normal cervical lordosis, no subluxation. Vertebrae: Prevertebral edema extends from about C1 through the C6 level. Metal artifact related to the plate and screw fixator noted at C3-4. There is vertebral edema at both of these  levels. Cord: Abnormal accentuated cord signal at the C3 and C4 levels. This is similar to the 05/21/2020 exam and probably represents myelomalacia and/or cord edema, although direct comparison is are difficult given that the degree of motion artifact on today's exam complicates comparison and blurs the cord. No definite new cord lesions identified. Posterior Fossa, vertebral arteries, paraspinal tissues: Prevertebral edema as noted above, not unexpected in the postoperative setting. Disc levels: The various levels of central and foraminal impingement at the non operative levels are unchanged from the 05/21/2020 exam. At the C3-4 operative level, there is wedge-shaped accentuated T2 signal in the vicinity of the original intervertebral space bulging posteriorly to narrow the AP diameter of the thecal sac to about 4.5 mm on image 8 series 1. On post-contrast image 9 series 6 this appears to have rim enhancement and a small amount of central hypoenhancement for example image 9 series 6. Low-grade enhancement along the anterior epidural space extending from the lower C2 level through C4 as shown on images 7 through 10 of series 6, although not really unexpected in the postoperative setting, and not demonstrating a nonenhancing component to suggest overt epidural abscess. With regard to the contents of this posteriorly bulging process at C3-4, this could well represent granulation tissue with a small amount of blood products centrally; strictly speaking, given the rim enhancing appearance, imaging does not confidently exclude the possibility of infection. IMPRESSION: 1. C3-4 ACDF 2 weeks ago, with rim enhancement and a small focus of central hypoenhancement at the C3-4 discectomy site bulging somewhat posteriorly to follow the contour of the previous spurring and chronic disc bulge at this level, with substantial narrowing of the AP diameter of the thecal sac similar to previous. Possibilities include granulation tissue  with a small amount of central blood products/hypoenhancement, versus early infection. There is some enhancement along the adjacent epidural space and prevertebral edema which are not necessarily unexpected in this postoperative setting, but also raise some concern for potential infection. Roughly stable cord edema/myelomalacia at this level compared to the preoperative exam. Because of the potential overlap between postoperative findings and infection and the lack of pathognomonic findings of infection, increased reliance on clinical presentation and other clinical signs is suggested. I would also recommend conventional radiographs of the cervical spine for complimentary assessment of the hardware. 2. The various levels of impingement at the non operative levels are unchanged from 05/21/2020. Electronically Signed   By: WVan ClinesM.D.   On: 09/27/2020 16:45   DG C-Arm 1-60 Min  Result Date: 09/14/2020 CLINICAL DATA:  ACDF. EXAM: DG CERVICAL SPINE - 1 VIEW; DG C-ARM 1-60 MIN COMPARISON:  None. FINDINGS: Single intraoperative cross-table lateral view of the cervical spine is submitted. Occiput to C3-4 is visualized. Degenerative changes at C3-4. No hardware is visualized. IMPRESSION: Intraoperative visualization of the upper cervical spine. Electronically Signed   By: Lorin Picket M.D.   On: 09/14/2020 11:49    Antibiotics:  Anti-infectives (From admission, onward)    None       Discharge Exam: Blood pressure (!) 143/75, pulse (!) 56, temperature 98.7 F (37.1 C), temperature source Oral, resp. rate 18, height 5' 11" (1.803 m), weight 114.2 kg, SpO2 98 %. Neurologic: Grossly normalupper extremities 4/5   Discharge Medications:   Allergies as of 09/30/2020       Reactions   Other Itching, Swelling, Palpitations   Pecans: itching and swelling of the tongue    Diphenhydramine Itching, Palpitations, Other (See Comments)   "jittery" Pt states he can take Benadryl in an emergency  situation   Pecan Nut (diagnostic)    Colchicine Diarrhea   Lipitor [atorvastatin] Other (See Comments)   Muscle cramps   Septra [sulfamethoxazole-trimethoprim] Itching        Medication List     TAKE these medications    amLODipine 10 MG tablet Commonly known as: NORVASC TAKE 1 TABLET(10 MG) BY MOUTH DAILY What changed: See the new instructions.   ascorbic acid 500 MG tablet Commonly known as: VITAMIN C Take 500 mg by mouth daily.   aspirin EC 81 MG tablet Take 81 mg by mouth daily.   Calcium-Magnesium-Zinc-D3 Tabs Take 1 tablet by mouth daily.   citalopram 40 MG tablet Commonly known as: CELEXA Take 1 tablet (40 mg total) by mouth daily.   diclofenac Sodium 1 % Gel Commonly known as: VOLTAREN Apply 1 application topically 3 (three) times daily as needed (PAIN).   fenofibrate 145 MG tablet Commonly known as: TRICOR Take 1 tablet (145 mg total) by mouth daily.   fluticasone 50 MCG/ACT nasal spray Commonly known as: FLONASE Place 2 sprays into both nostrils daily. What changed:  when to take this reasons to take this   furosemide 40 MG tablet Commonly known as: LASIX Take 1 tablet (40 mg total) by mouth daily.   gabapentin 300 MG capsule Commonly known as: NEURONTIN Take 900 mg by mouth 3 (three) times daily.   glucose blood test strip Use as instructed once daily E119 What changed:  how much to take how to take this when to take this additional instructions   ibuprofen 800 MG tablet Commonly known as: ADVIL Take 800 mg by mouth 3 (three) times daily.   Lancets Misc Use as directed daily E11.9 What changed:  how much to take how to take this when to take this additional instructions   lisinopril 40 MG tablet Commonly known as: ZESTRIL TAKE ONE TABLET BY MOUTH DAILY   loratadine 10 MG tablet Commonly known as: CLARITIN Take 1 tablet (10 mg total) by mouth daily as needed for allergies.   magnesium oxide 400 MG tablet Commonly known  as: MAG-OX Take 400 mg by mouth daily.   metFORMIN 500 MG 24 hr tablet Commonly known as: GLUCOPHAGE-XR TAKE TWO TABLETS BY MOUTH DAILY WITH BREAKFAST What changed:  how much to take how to take this when to take this additional instructions   methocarbamol 500 MG tablet Commonly known as: ROBAXIN Take 500 mg by mouth 4 (four) times daily as needed for muscle spasms.   ONE TOUCH ULTRA 2 w/Device Kit Use as directed daily E11.9 What changed:  how much to take how to take this when to take this additional instructions   oxyCODONE-acetaminophen 10-325 MG tablet Commonly known as: PERCOCET Take 1 tablet by mouth every 6 (six) hours as needed for pain.   Potassium Chloride ER 20 MEQ Tbcr TAKE ONE TABLET BY MOUTH DAILY What changed: how much to take   rosuvastatin 20 MG tablet Commonly known as: CRESTOR TAKE 1 TABLET(20 MG) BY MOUTH DAILY What changed: See the new instructions.   sildenafil 100 MG tablet Commonly known as: Viagra Take 0.5-1 tablets (50-100 mg total) by mouth daily as needed for erectile dysfunction.   tiZANidine 4 MG tablet Commonly known as: ZANAFLEX Take 4 mg by mouth 3 (three) times daily as needed for muscle spasms.   vitamin B-12 1000 MCG tablet Commonly known as: CYANOCOBALAMIN Take 1 tablet (1,000 mcg total) by mouth daily.   zinc gluconate 50 MG tablet Take 50 mg by mouth daily.        Disposition: CIR   Final Dx: worsening myelopathy symptoms post acdf  Discharge Instructions     Call MD for:  difficulty breathing, headache or visual disturbances   Complete by: As directed    Call MD for:  hives   Complete by: As directed    Call MD for:  persistant dizziness or light-headedness   Complete by: As directed    Call MD for:  persistant nausea and vomiting   Complete by: As directed    Call MD for:  redness, tenderness, or signs of infection (pain, swelling, redness, odor or green/yellow discharge around incision site)   Complete  by: As directed    Call MD for:  severe uncontrolled pain   Complete by: As directed    Call MD for:  temperature >100.4   Complete by: As directed    Diet - low sodium heart healthy   Complete by: As directed    Incentive spirometry RT   Complete by: As directed    Increase activity slowly   Complete by: As directed    No wound care   Complete by: As directed           Signed: Ocie Cornfield Meyran 09/30/2020, 3:24 PM

## 2020-09-30 NOTE — Progress Notes (Signed)
Inpatient Rehabilitation Medication Review by a Pharmacist  A complete drug regimen review was completed for this patient to identify any potential clinically significant medication issues.  Clinically significant medication issues were identified:  no  Check AMION for pharmacist assigned to patient if future medication questions/issues arise during this admission.  Pharmacist comments:   Time spent performing this drug regimen review (minutes):  [place] 09/30/2020 5:41 PM

## 2020-09-30 NOTE — Progress Notes (Signed)
Inpatient Rehabilitation  Patient information reviewed and entered into e[facility]hab system by Izan Miron M. Decklyn Hyder, M.A., CCC/SLP, PPS Coordinator.  Information including medical coding, functional ability and quality indicators will be reviewed and updated through discharge.    

## 2020-09-30 NOTE — Progress Notes (Addendum)
Inpatient Rehab Admissions Coordinator:    I have insurance approval and a bed available for pt to admit to CIR today.  Awaiting confirmation from Dr. Marcello Moores that he is ready.  Will let pt/family and TOC team know.   1520: Received approval for CIR admit from Margo Aye, NP.    Shann Medal, PT, DPT Admissions Coordinator [phone] 09/30/20  1:11 PM

## 2020-09-30 NOTE — Progress Notes (Signed)
Patient arrived to 4W02 at 1615 by bed. Patient A/O x4. Oriented to room and to rehab. Call bell and phone within reach. Assessment complete. Vital signs stable.   Lupita Raider LPN

## 2020-09-30 NOTE — Progress Notes (Signed)
Physical Therapy Treatment Patient Details Name: Harold [name] MRN: [id number] DOB: 28-Sep-1965 Today's Date: 09/30/2020    History of Present Illness 55 year old man who is 2 weeks status post C3-4 ACDF for severe cord impingement who has increased weakness postoperatively.  His imaging is most consistent with expected postoperative changes.  His ESR is normal.    PT Comments    Pt with improved transfer ability this date despite numbness in hands and impaired fine motor and UE co-ordination. Pt did begin ambulation with minA and RW. Pt continues with impaired co-ordination, sequencing, and bilat LE weakness. Continue to recommend CIR UPon d/c for maximal functional recovery. Acute PT to cont to follow.    Follow Up Recommendations  CIR     Equipment Recommendations   (TBD at next venue)    Recommendations for Other Services       Precautions / Restrictions Precautions Precautions: Cervical Precaution Booklet Issued: No Precaution Comments: verbally reviewed all cervical precautions. Pt recalled 3/3. Required Braces or Orthoses: Cervical Brace Cervical Brace: Soft collar (when up out of bed) Restrictions Weight Bearing Restrictions: No    Mobility  Bed Mobility Overal bed mobility: Needs Assistance Bed Mobility: Rolling;Sidelying to Sit;Sit to Sidelying Rolling: Supervision Sidelying to sit: Min guard     Sit to sidelying: Min guard General bed mobility comments: definite use of bed rail, increased time, difficulty using R hand due to numb, pt pushing from dorsum of hand, tactile cues to correct    Transfers Overall transfer level: Needs assistance Equipment used: Rolling walker (2 wheeled);Ambulation equipment used Transfers: Sit to/from Stand Sit to Stand: Min assist;From elevated surface         General transfer comment: minA to power up and steady during transition of hands from bed to RW, verbal cues for hand placement, bilat knee instability intitiation  requiring minA to steadhy  Ambulation/Gait Ambulation/Gait assistance: Min assist Gait Distance (Feet): 40 Feet (x2) Assistive device: Rolling walker (2 wheeled) Gait Pattern/deviations: Step-through pattern;Decreased stride length;Decreased dorsiflexion - right;Decreased dorsiflexion - left;Steppage     General Gait Details: pt with noted bilat LE shakiness and knee instability but no overt episode of LOB, seated rest break required, very dependent on bilat UEs. pt constantly looking down due to not being able to feel feet   Stairs             Wheelchair Mobility    Modified Rankin (Stroke Patients Only)       Balance Overall balance assessment: Needs assistance Sitting-balance support: No upper extremity supported;Feet supported Sitting balance-Leahy Scale: Good Sitting balance - Comments: able to sit EOB without PT support   Standing balance support: Bilateral upper extremity supported;During functional activity Standing balance-Leahy Scale: Poor Standing balance comment: dependent on RW                            Cognition Arousal/Alertness: Awake/alert Behavior During Therapy: WFL for tasks assessed/performed Overall Cognitive Status: Within Functional Limits for tasks assessed                                 General Comments: pt difficult to understand with garbled speech      Exercises General Exercises - Lower Extremity Ankle Circles/Pumps: AROM;Both;10 reps;Seated Gluteal Sets: AROM;Both;10 reps;Seated Long Arc Quad: AROM;Both;10 reps;Seated (with 5 sec hold in extension) Heel Slides: AROM;Both;10 reps;Seated (digging heals into floor to activate  hamstrings)    General Comments General comments (skin integrity, edema, etc.): VSS      Pertinent Vitals/Pain Pain Assessment: 0-10 Pain Score: 9  Pain Location: neck Pain Descriptors / Indicators: Sore Pain Intervention(s): Premedicated before session    Home Living                       Prior Function            PT Goals (current goals can now be found in the care plan section) Acute Rehab PT Goals Patient Stated Goal: get my legs stronger, stop falling Progress towards PT goals: Progressing toward goals    Frequency    Min 5X/week      PT Plan Frequency needs to be updated    Co-evaluation              AM-PAC PT "6 Clicks" Mobility   Outcome Measure  Help needed turning from your back to your side while in a flat bed without using bedrails?: A Little Help needed moving from lying on your back to sitting on the side of a flat bed without using bedrails?: A Little Help needed moving to and from a bed to a chair (including a wheelchair)?: A Little Help needed standing up from a chair using your arms (e.g., wheelchair or bedside chair)?: A Little Help needed to walk in hospital room?: A Lot Help needed climbing 3-5 steps with a railing? : A Lot 6 Click Score: 16    End of Session Equipment Utilized During Treatment: Cervical collar;Gait belt Activity Tolerance: Patient tolerated treatment well;Patient limited by fatigue Patient left: in bed;with call bell/phone within reach;with bed alarm set;with family/visitor present Nurse Communication: Mobility status PT Visit Diagnosis: Muscle weakness (generalized) (M62.81);Difficulty in walking, not elsewhere classified (R26.2)     Time: 0102-7253 PT Time Calculation (min) (ACUTE ONLY): 25 min  Charges:  $Gait Training: 8-22 mins $Therapeutic Exercise: 8-22 mins                     Harold [name], PT, DPT Acute Rehabilitation Services Pager #: [phone] Office #: [phone]    Harold [name] 09/30/2020, 2:06 PM

## 2020-09-30 NOTE — Progress Notes (Signed)
Subjective: Patient reports his strength is improving  Objective: Vital signs in last 24 hours: Temp:  [98.6 F (37 C)-99.1 F (37.3 C)] 98.6 F (37 C) (07/05 2218) Pulse Rate:  [55-60] 55 (07/05 2218) Resp:  [17-18] 18 (07/05 2218) BP: (109-142)/(80-97) 142/97 (07/05 2218) SpO2:  [95 %-98 %] 98 % (07/05 2218)  Intake/Output from previous day: 07/05 0701 - 07/06 0700 In: 360 [P.O.:360] Out: 1900 [Urine:1900] Intake/Output this shift: No intake/output data recorded.  Patient bathing in bathroom which limits exam, but standing up with walker  Lab Results: No results for input(s): WBC, HGB, HCT, PLT in the last 72 hours. BMET No results for input(s): NA, K, CL, CO2, GLUCOSE, BUN, CREATININE, CALCIUM in the last 72 hours.  Studies/Results: No results found.  Assessment/Plan: S/p C3-4 ACDF with myelopathy - awaiting CIR; cont PT/OT - slow dex taper    Harold [name] 09/30/2020, 9:06 AM

## 2020-09-30 NOTE — Progress Notes (Signed)
Neurosurgery  Pt seen and examined.  4+/5 strength in LEs, proximal UEs, 4/5 strength in handgrip

## 2020-09-30 NOTE — PMR Pre-admission (Signed)
PMR Admission Coordinator Pre-Admission Assessment  Patient: Harold [name] is an 55 y.o., male MRN: [id number] DOB: [birth year] Height: '5\' 11"'  (180.3 cm) Weight: 114.2 kg  Insurance Information HMO: yes    PPO:      PCP:      IPA:      80/20:      OTHER:  PRIMARY: [employer]      Policy#: [id number]      Subscriber: pt CM Name: Maudie Mercury      Phone#: [phone] ext [phone]     Fax#: [phone] Pre-Cert#: [id number] [place] for CIR given by Maudie Mercury with [facility] Medicare, f/u due to her at fax listed above on 7/12      Employer:  Benefits:  Phone #: [phone]     Name:  Eff. Date: 03/29/19     Deduct: $0      Out of Pocket Max: $3900 (met $1185.29)      Life Max: n/a CIR: $295/day for days 1-6      SNF: 20 full days Outpatient:      Co-Pay: $10/20/visit Home Health: 100%      Co-Pay:  DME: 80%     Co-Ins: 20% Providers:  SECONDARY:       Policy#:      Phone#:   Development worker, community:       Phone#:   The Therapist, art Information Summary" for patients in Inpatient Rehabilitation Facilities with attached "Privacy Act [place] Records" was provided and verbally reviewed with: Patient and Family  Emergency Contact Information Contact Information     Name Relation Home Work Mobile   Blythe,Tracy Significant other Grantley Mother [phone]         Current Medical History  Patient Admitting Diagnosis: cervical myelopathy  History of Present Illness: Pt is a 55 y/o male with history of DM2 and recent C3-4 ACDF for cervical myelopathy on 6/20 per Dr. Saintclair Halsted.  DIscharged home and represented to [place] on 7/3 due to worsening weakness and multiple falls.  Denies fevers, chills, or swallowing difficulty.  No drainage from neck incision.  Imaging most consistent with expected postoperative changes and ESR was normal.  Neurosurgery expects changes will likely resolve with time, but felt admission was appropriate given significant weakness.  They also began a  steroid taper for myelopathy symptoms.  Therapy evaluations were completed and pt was recommended for CIR.      Patient's medical record from [facility] has been reviewed by the rehabilitation admission coordinator and physician.  Past Medical History  Past Medical History:  Diagnosis Date   Acute idiopathic pericarditis 04/08/2010   Qualifier: Diagnosis of  By: [name], [name]     Acute sinusitis 06/04/2014   Anxiety    CHF (congestive heart failure) ([place]) 06/29/2010   was pericardititis not CHF   Chronic back pain    Constipation    DDD (degenerative disc disease), lumbar 09/14/2012   Depression    Erectile dysfunction 09/14/2012   GERD (gastroesophageal reflux disease)    ocassional   Gout, unspecified 05/01/2007   Qualifier: Diagnosis of  By: [name] MD, Dellis Filbert A    Headache    migraines in the past   History of pancreatitis    a. admx 04-2009.Marland KitchenMarland Kitchen? 2-2 triglycerides   Hypertension    Hypertriglyceridemia    a. followed by [facility]   Left sided sciatica 09/14/2012   Neuropathy    Obesity    OSA (obstructive sleep apnea)  CPAP- not current   Restless legs    Type 2 diabetes mellitus with diabetic neuropathy, unspecified ([place]) 09/20/2019    Family History   family history includes Colon cancer (age of onset: 27) in his father; Diabetes in his father, mother, and sister; Heart disease (age of onset: 83) in his father; Hypertension in an other family member; Liver cancer in his paternal uncle; Pancreatitis in his father and paternal uncle; Stomach cancer in his maternal grandfather.  Prior Rehab/Hospitalizations Has the patient had prior rehab or hospitalizations prior to admission? Yes  Has the patient had major surgery during 100 days prior to admission? Yes   Current Medications  Current Facility-Administered Medications:    0.9 %  sodium chloride infusion, 250 mL, Intravenous, Continuous, Vallarie Mare, MD   acetaminophen (TYLENOL) tablet 650 mg, 650 mg, Oral,  Q4H PRN, 650 mg at 09/28/20 0456 **OR** acetaminophen (TYLENOL) suppository 650 mg, 650 mg, Rectal, Q4H PRN, Vallarie Mare, MD   amLODipine (NORVASC) tablet 10 mg, 10 mg, Oral, Daily, Vallarie Mare, MD, 10 mg at 09/30/20 5462   ascorbic acid (VITAMIN C) tablet 500 mg, 500 mg, Oral, Daily, Vallarie Mare, MD, 500 mg at 09/30/20 7035   aspirin EC tablet 81 mg, 81 mg, Oral, Daily, Vallarie Mare, MD, 81 mg at 09/30/20 0093   citalopram (CELEXA) tablet 40 mg, 40 mg, Oral, Daily, Vallarie Mare, MD, 40 mg at 09/30/20 8182   dexamethasone (DECADRON) tablet 4 mg, 4 mg, Oral, Q8H, 4 mg at 09/30/20 0537 **FOLLOWED BY** [START ON 10/02/2020] dexamethasone (DECADRON) tablet 2 mg, 2 mg, Oral, Q8H **FOLLOWED BY** [START ON 10/05/2020] dexamethasone (DECADRON) tablet 2 mg, 2 mg, Oral, Q12H **FOLLOWED BY** [START ON 10/08/2020] dexamethasone (DECADRON) tablet 1 mg, 1 mg, Oral, Q12H **FOLLOWED BY** [START ON 10/11/2020] dexamethasone (DECADRON) tablet 1 mg, 1 mg, Oral, Daily, Vallarie Mare, MD   docusate sodium (COLACE) capsule 100 mg, 100 mg, Oral, BID, Vallarie Mare, MD, 100 mg at 09/30/20 0808   enoxaparin (LOVENOX) injection 40 mg, 40 mg, Subcutaneous, Q24H, Vallarie Mare, MD, 40 mg at 09/30/20 1202   fenofibrate tablet 160 mg, 160 mg, Oral, Daily, Vallarie Mare, MD, 160 mg at 09/30/20 0806   fluticasone (FLONASE) 50 MCG/ACT nasal spray 2 spray, 2 spray, Each Nare, Daily, Vallarie Mare, MD   furosemide (LASIX) tablet 40 mg, 40 mg, Oral, Daily, Vallarie Mare, MD, 40 mg at 09/30/20 9937   gabapentin (NEURONTIN) capsule 900 mg, 900 mg, Oral, TID, Vallarie Mare, MD, 900 mg at 09/30/20 1696   HYDROmorphone (DILAUDID) injection 0.5 mg, 0.5 mg, Intravenous, Q2H PRN, Vallarie Mare, MD, 0.5 mg at 09/30/20 7893   insulin aspart (novoLOG) injection 0-20 Units, 0-20 Units, Subcutaneous, TID WC, Vallarie Mare, MD, 4 Units at 09/30/20 1204   insulin aspart (novoLOG)  injection 0-5 Units, 0-5 Units, Subcutaneous, QHS, Vallarie Mare, MD, 2 Units at 09/28/20 2240   lisinopril (ZESTRIL) tablet 40 mg, 40 mg, Oral, Daily, Vallarie Mare, MD, 40 mg at 09/30/20 8101   loratadine (CLARITIN) tablet 10 mg, 10 mg, Oral, Daily PRN, Vallarie Mare, MD   magnesium oxide (MAG-OX) tablet 400 mg, 400 mg, Oral, Daily, Vallarie Mare, MD, 400 mg at 09/30/20 7510   menthol-cetylpyridinium (CEPACOL) lozenge 3 mg, 1 lozenge, Oral, PRN **OR** phenol (CHLORASEPTIC) mouth spray 1 spray, 1 spray, Mouth/Throat, PRN, Vallarie Mare, MD   metFORMIN (GLUCOPHAGE-XR) 24 hr tablet 1,000  mg, 1,000 mg, Oral, Q breakfast, Vallarie Mare, MD, 1,000 mg at 09/30/20 8916   methocarbamol (ROBAXIN) tablet 500 mg, 500 mg, Oral, QID PRN, Vallarie Mare, MD, 500 mg at 09/29/20 9450   multivitamin with minerals tablet 1 tablet, 1 tablet, Oral, Daily, Vallarie Mare, MD, 1 tablet at 09/30/20 0808   ondansetron (ZOFRAN) tablet 4 mg, 4 mg, Oral, Q6H PRN **OR** ondansetron (ZOFRAN) injection 4 mg, 4 mg, Intravenous, Q6H PRN, Vallarie Mare, MD, 4 mg at 09/27/20 2117   oxyCODONE-acetaminophen (PERCOCET/ROXICET) 5-325 MG per tablet 1 tablet, 1 tablet, Oral, Q6H PRN, 1 tablet at 09/30/20 1202 **AND** oxyCODONE (Oxy IR/ROXICODONE) immediate release tablet 5 mg, 5 mg, Oral, Q6H PRN, Vallarie Mare, MD, 5 mg at 09/30/20 1202   polyethylene glycol (MIRALAX / GLYCOLAX) packet 17 g, 17 g, Oral, Daily PRN, Vallarie Mare, MD   potassium chloride SA (KLOR-CON) CR tablet 20 mEq, 20 mEq, Oral, Daily, Vallarie Mare, MD, 20 mEq at 09/30/20 3888   rosuvastatin (CRESTOR) tablet 20 mg, 20 mg, Oral, Daily, Vallarie Mare, MD, 20 mg at 09/30/20 2800   sodium chloride flush (NS) 0.9 % injection 3 mL, 3 mL, Intravenous, Q12H, Vallarie Mare, MD, 3 mL at 09/30/20 0809   sodium chloride flush (NS) 0.9 % injection 3 mL, 3 mL, Intravenous, PRN, Vallarie Mare, MD   sodium  phosphate (FLEET) 7-19 GM/118ML enema 1 enema, 1 enema, Rectal, Once PRN, Vallarie Mare, MD   vitamin B-12 (CYANOCOBALAMIN) tablet 1,000 mcg, 1,000 mcg, Oral, Daily, Vallarie Mare, MD, 1,000 mcg at 09/30/20 3491   zinc sulfate capsule 220 mg, 220 mg, Oral, Daily, Vallarie Mare, MD, 220 mg at 09/30/20 7915  Patients Current Diet:  Diet Order             Diet Carb Modified Fluid consistency: Thin; Room service appropriate? Yes  Diet effective now                   Precautions / Restrictions Precautions Precautions: Cervical Precaution Booklet Issued: No Precaution Comments: verbally reviewed all cervical precautions. Pt recalled 3/3. Cervical Brace: Soft collar Restrictions Weight Bearing Restrictions: No Other Position/Activity Restrictions: no lifting more than 5lbs   Has the patient had 2 or more falls or a fall with injury in the past year? Yes  Prior Activity Level    Prior Functional Level Self Care: Did the patient need help bathing, dressing, using the toilet or eating? Independent  Indoor Mobility: Did the patient need assistance with walking from room to room (with or without device)?  Stairs: Did the patient need assistance with internal or external stairs (with or without device)? Independent  Functional Cognition: Did the patient need help planning regular tasks such as shopping or remembering to take medications? Independent  Home Assistive Devices / Equipment Home Equipment: Kasandra Knudsen - single point, Environmental consultant - 2 wheels, Transport planner, Other (comment)  Prior Device Use: Indicate devices/aids used by the patient prior to current illness, exacerbation or injury? Walker and only following surgery on 6/20.   Current Functional Level Cognition  Overall Cognitive Status: Within Functional Limits for tasks assessed Orientation Level: Oriented X4 General Comments: Pt. was sleepy and stated he did not sleep well    Extremity Assessment (includes  Sensation/Coordination)  Upper Extremity Assessment: Overall WFL for tasks assessed RUE Deficits / Details: Shoulder and elbow strength 5/5, grip strength 4/5 RUE Sensation: WNL RUE Coordination: decreased fine motor LUE  Deficits / Details: LUE gross strength is 3/5 LUE Sensation: WNL LUE Coordination: decreased fine motor, decreased gross motor  Lower Extremity Assessment: Generalized weakness (at least 3/5 sitting EOB, functionally displays bilat knee buckling, standing tolerance <10 seconds)    ADLs  Overall ADL's : Needs assistance/impaired Eating/Feeding: Set up, Sitting Grooming: Wash/dry hands, Wash/dry face, Oral care, Applying deodorant, Set up, Sitting Upper Body Bathing: Minimal assistance, Sitting Lower Body Bathing: Moderate assistance, Sitting/lateral leans Upper Body Dressing : Set up, Sitting Lower Body Dressing: Sit to/from stand, Moderate assistance Toilet Transfer:  (Pt. refused to attempt to amb stated his legs would give out.) Toileting- Clothing Manipulation and Hygiene: Moderate assistance, Sitting/lateral lean Functional mobility during ADLs:  (Pt. was Min a with sit to stand from bed.) General ADL Comments: Pt. sat EOB for ADLs.    Mobility  Overal bed mobility: Needs Assistance Bed Mobility: Rolling, Sidelying to Sit Rolling: Min guard Sidelying to sit: Supervision Sit to sidelying: Supervision General bed mobility comments: for safety, cues for technique and use of bedrail    Transfers  Overall transfer level: Needs assistance Equipment used: Rolling walker (2 wheeled), Ambulation equipment used Transfer via Lift Equipment: Stedy Transfers: Sit to/from Stand Sit to Stand: Min assist, From elevated surface General transfer comment: Pt. was Min A to march in place and to side step up the head of bed. Pt. has a fear of falling.    Ambulation / Gait / Stairs / Emergency planning/management officer  Ambulation/Gait General Gait Details: nt    Posture / Balance Dynamic  Sitting Balance Sitting balance - Comments: able to sit EOB without PT support Balance Overall balance assessment: History of Falls, Needs assistance Sitting-balance support: No upper extremity supported, Feet supported Sitting balance-Leahy Scale: Good Sitting balance - Comments: able to sit EOB without PT support Standing balance support: Bilateral upper extremity supported, During functional activity Standing balance-Leahy Scale: Fair Standing balance comment: reliant on external support    Special needs/care consideration Diabetic management yes   Previous Home Environment (from acute therapy documentation) Living Arrangements: Spouse/significant other Available Help at Discharge: Family, Available 24 hours/day Type of Home: House Home Layout: Two level, 1/2 bath on main level Alternate Level Stairs-Rails: Right, Left, Can reach both Alternate Level Stairs-Number of Steps: Full-flight to 2nd level  - reports sleeping on couch downstairs Home Access: Stairs to enter Entrance Stairs-Rails: Right, Left, Can reach both Entrance Stairs-Number of Steps: 3 Bathroom Shower/Tub: Government social research officer Accessibility: Yes Additional Comments: reacher  Discharge Living Setting Plans for Discharge Living Setting: Patient's home Type of Home at Discharge: House Discharge Home Layout: Able to live on main level with bedroom/bathroom Discharge Home Access: Stairs to enter Entrance Stairs-Rails: Left, Right Entrance Stairs-Number of Steps: 3 Discharge Bathroom Shower/Tub: Tub/shower unit Discharge Bathroom Toilet: Standard Discharge Bathroom Accessibility: Yes How Accessible: Accessible via walker Does the patient have any problems obtaining your medications?: No  Social/Family/Support Systems Patient Roles: Partner Anticipated Caregiver: Lenis Dickinson (s/o) Anticipated Caregiver's Contact Information: [phone] Ability/Limitations of Caregiver: min  assist Caregiver Availability: 24/7 Discharge Plan Discussed with Primary Caregiver: Yes Is Caregiver In Agreement with Plan?: Yes Does Caregiver/Family have Issues with Lodging/Transportation while Pt is in Rehab?: No  Goals Patient/Family Goal for Rehab: PT/OT supervision, SLP n/a Expected length of stay: 12-16 days Additional Information: frequent falls at home following ACDF Pt/Family Agrees to Admission and willing to participate: Yes Program Orientation Provided & Reviewed with Pt/Caregiver Including Roles  & Responsibilities: Yes  Barriers  to Discharge: Insurance for SNF coverage  Decrease burden of Care through IP rehab admission: n/a  Possible need for SNF placement upon discharge: Not anticipated.   Patient Condition: I have reviewed [facility], spoken with CSW, and patient/significant other. I discussed via phone for inpatient rehabilitation assessment.  Patient will benefit from ongoing PT and OT, can actively participate in 3 hours of therapy a day 5 days of the week, and can make measurable gains during the admission.  Patient will also benefit from the coordinated team approach during an Inpatient Acute Rehabilitation admission.  The patient will receive intensive therapy as well as Rehabilitation physician, nursing, social worker, and care management interventions.  Due to safety, skin/wound care, disease management, medication administration, pain management, and patient education the patient requires 24 hour a day rehabilitation nursing.  The patient is currently min assist with mobility and basic ADLs.  Discharge setting and therapy post discharge at home with home health is anticipated.  Patient has agreed to participate in the Acute Inpatient Rehabilitation Program and will admit today.  Preadmission Screen Completed By:  Michel Santee, 09/30/2020 12:59 PM ______________________________________________________________________   Discussed status with  Dr. Ranell Patrick on 09/30/20  at 1:10 PM  and received approval for admission today.  Admission Coordinator:  Michel Santee, PT, DPT  time 1:11 PM Sudie Grumbling 09/30/20    Assessment/Plan: Diagnosis: Cervical myelopathy Does the need for close, 24 hr/day Medical supervision in concert with the patient's rehab needs make it unreasonable for this patient to be served in a less intensive setting? Yes Co-Morbidities requiring supervision/potential complications: overweight (BMI 35.11), gout, hypertriglyceridemia, depression, essential HTN Due to bladder management, bowel management, safety, skin/wound care, disease management, medication administration, pain management, and patient education, does the patient require 24 hr/day rehab nursing? Yes Does the patient require coordinated care of a physician, rehab nurse, PT, OT to address physical and functional deficits in the context of the above medical diagnosis(es)? Yes Addressing deficits in the following areas: balance, endurance, locomotion, strength, transferring, bowel/bladder control, bathing, dressing, feeding, grooming, toileting, and psychosocial support Can the patient actively participate in an intensive therapy program of at least 3 hrs of therapy 5 days a week? Yes The potential for patient to make measurable gains while on inpatient rehab is excellent Anticipated functional outcomes upon discharge from inpatient rehab: modified independent PT, modified independent OT, modified independent SLP Estimated rehab length of stay to reach the above functional goals is: 2-3 weeks Anticipated discharge destination: Home 10. Overall Rehab/Functional Prognosis: excellent   MD Signature: Leeroy Cha, MD

## 2020-09-30 NOTE — Progress Notes (Addendum)
PMR Admission Coordinator Pre-Admission Assessment   Patient: Harold [name] is an 55 y.o., male MRN: [id number] DOB: 1966/02/27 Height: _0  (180.3 cm) Weight: 114.2 kg   Insurance Information HMO: yes    PPO:      PCP:      IPA:      80/20:      OTHER: PRIMARY: [employer]      Policy#: [id number]      Subscriber: pt CM Name: Maudie Mercury      Phone#: [phone] ext [phone]     Fax#: [phone] Pre-Cert#: [id number] [place] for CIR given by Maudie Mercury with [facility] Medicare, f/u due to her at fax listed above on 7/12      Employer: Benefits:  Phone #: [phone]     Name: Eff. Date: 03/29/19     Deduct: $0      Out of Pocket Max: $3900 (met $1185.29)      Life Max: n/a CIR: $295/day for days 1-6      SNF: 20 full days Outpatient:      Co-Pay: $10/20/visit Home Health: 100%      Co-Pay: DME: 80%     Co-Ins: 20% Providers:  SECONDARY:       Policy#:      Phone#:   Development worker, community:       Phone#:   The Therapist, art Information Summary" for patients in Inpatient Rehabilitation Facilities with attached "Privacy Act [place] Records" was provided and verbally reviewed with: Patient and Family   Emergency Contact Information Contact Information       Name Relation Home Work Mobile    Blythe,Tracy Significant other [place] Mother [phone]               Current Medical History  Patient Admitting Diagnosis: cervical myelopathy   History of Present Illness: Pt is a 55 y/o male with history of DM2 and recent C3-4 ACDF for cervical myelopathy on 6/20 per Dr. Saintclair Halsted.  DIscharged home and represented to [facility] on 7/3 due to worsening weakness and multiple falls.  Denies fevers, chills, or swallowing difficulty.  No drainage from neck incision.  Imaging most consistent with expected postoperative changes and ESR was normal.  Neurosurgery expects changes will likely resolve with time, but felt admission was appropriate given significant weakness.  They  also began a steroid taper for myelopathy symptoms.  Therapy evaluations were completed and pt was recommended for CIR.     Patient's medical record from [facility] has been reviewed by the rehabilitation admission coordinator and physician.   Past Medical History      Past Medical History:  Diagnosis Date   Acute idiopathic pericarditis 04/08/2010    Qualifier: Diagnosis of  By: [name], [name]     Acute sinusitis 06/04/2014   Anxiety     CHF (congestive heart failure) ([place]) 06/29/2010    was pericardititis not CHF   Chronic back pain     Constipation     DDD (degenerative disc disease), lumbar 09/14/2012   Depression     Erectile dysfunction 09/14/2012   GERD (gastroesophageal reflux disease)      ocassional   Gout, unspecified 05/01/2007    Qualifier: Diagnosis of  By: [name] MD, Dellis Filbert A   Headache      migraines in the past   History of pancreatitis      a. admx 04-2009.Marland KitchenMarland Kitchen? 2-2 triglycerides   Hypertension     Hypertriglyceridemia  a. followed by [facility]   Left sided sciatica 09/14/2012   Neuropathy     Obesity     OSA (obstructive sleep apnea)      CPAP- not current   Restless legs     Type 2 diabetes mellitus with diabetic neuropathy, unspecified ([place]) 09/20/2019      Family History   family history includes Colon cancer (age of onset: 57) in his father; Diabetes in his father, mother, and sister; Heart disease (age of onset: 57) in his father; Hypertension in an other family member; Liver cancer in his paternal uncle; Pancreatitis in his father and paternal uncle; Stomach cancer in his maternal grandfather.   Prior Rehab/Hospitalizations Has the patient had prior rehab or hospitalizations prior to admission? Yes   Has the patient had major surgery during 100 days prior to admission? Yes              Current Medications   Current Facility-Administered Medications:   0.9 %  sodium chloride infusion, 250 mL, Intravenous, Continuous, Vallarie Mare, MD    acetaminophen (TYLENOL) tablet 650 mg, 650 mg, Oral, Q4H PRN, 650 mg at 09/28/20 0456 **OR** acetaminophen (TYLENOL) suppository 650 mg, 650 mg, Rectal, Q4H PRN, Vallarie Mare, MD   amLODipine (NORVASC) tablet 10 mg, 10 mg, Oral, Daily, Vallarie Mare, MD, 10 mg at 09/30/20 0370   ascorbic acid (VITAMIN C) tablet 500 mg, 500 mg, Oral, Daily, Vallarie Mare, MD, 500 mg at 09/30/20 4888   aspirin EC tablet 81 mg, 81 mg, Oral, Daily, Vallarie Mare, MD, 81 mg at 09/30/20 9169   citalopram (CELEXA) tablet 40 mg, 40 mg, Oral, Daily, Vallarie Mare, MD, 40 mg at 09/30/20 4503   dexamethasone (DECADRON) tablet 4 mg, 4 mg, Oral, Q8H, 4 mg at 09/30/20 0537 **FOLLOWED BY** [START ON 10/02/2020] dexamethasone (DECADRON) tablet 2 mg, 2 mg, Oral, Q8H **FOLLOWED BY** [START ON 10/05/2020] dexamethasone (DECADRON) tablet 2 mg, 2 mg, Oral, Q12H **FOLLOWED BY** [START ON 10/08/2020] dexamethasone (DECADRON) tablet 1 mg, 1 mg, Oral, Q12H **FOLLOWED BY** [START ON 10/11/2020] dexamethasone (DECADRON) tablet 1 mg, 1 mg, Oral, Daily, Vallarie Mare, MD   docusate sodium (COLACE) capsule 100 mg, 100 mg, Oral, BID, Vallarie Mare, MD, 100 mg at 09/30/20 0808   enoxaparin (LOVENOX) injection 40 mg, 40 mg, Subcutaneous, Q24H, Vallarie Mare, MD, 40 mg at 09/30/20 1202   fenofibrate tablet 160 mg, 160 mg, Oral, Daily, Vallarie Mare, MD, 160 mg at 09/30/20 0806   fluticasone (FLONASE) 50 MCG/ACT nasal spray 2 spray, 2 spray, Each Nare, Daily, Vallarie Mare, MD   furosemide (LASIX) tablet 40 mg, 40 mg, Oral, Daily, Vallarie Mare, MD, 40 mg at 09/30/20 8882   gabapentin (NEURONTIN) capsule 900 mg, 900 mg, Oral, TID, Vallarie Mare, MD, 900 mg at 09/30/20 8003   HYDROmorphone (DILAUDID) injection 0.5 mg, 0.5 mg, Intravenous, Q2H PRN, Vallarie Mare, MD, 0.5 mg at 09/30/20 4917   insulin aspart (novoLOG) injection 0-20 Units, 0-20 Units, Subcutaneous, TID WC, Vallarie Mare, MD,  4 Units at 09/30/20 1204   insulin aspart (novoLOG) injection 0-5 Units, 0-5 Units, Subcutaneous, QHS, Vallarie Mare, MD, 2 Units at 09/28/20 2240   lisinopril (ZESTRIL) tablet 40 mg, 40 mg, Oral, Daily, Vallarie Mare, MD, 40 mg at 09/30/20 0807   loratadine (CLARITIN) tablet 10 mg, 10 mg, Oral, Daily PRN, Vallarie Mare, MD   magnesium oxide (MAG-OX) tablet  400 mg, 400 mg, Oral, Daily, Vallarie Mare, MD, 400 mg at 09/30/20 1610   menthol-cetylpyridinium (CEPACOL) lozenge 3 mg, 1 lozenge, Oral, PRN **OR** phenol (CHLORASEPTIC) mouth spray 1 spray, 1 spray, Mouth/Throat, PRN, Vallarie Mare, MD   metFORMIN (GLUCOPHAGE-XR) 24 hr tablet 1,000 mg, 1,000 mg, Oral, Q breakfast, Vallarie Mare, MD, 1,000 mg at 09/30/20 9604   methocarbamol (ROBAXIN) tablet 500 mg, 500 mg, Oral, QID PRN, Vallarie Mare, MD, 500 mg at 09/29/20 5409   multivitamin with minerals tablet 1 tablet, 1 tablet, Oral, Daily, Vallarie Mare, MD, 1 tablet at 09/30/20 0808   ondansetron (ZOFRAN) tablet 4 mg, 4 mg, Oral, Q6H PRN **OR** ondansetron (ZOFRAN) injection 4 mg, 4 mg, Intravenous, Q6H PRN, Vallarie Mare, MD, 4 mg at 09/27/20 2117   oxyCODONE-acetaminophen (PERCOCET/ROXICET) 5-325 MG per tablet 1 tablet, 1 tablet, Oral, Q6H PRN, 1 tablet at 09/30/20 1202 **AND** oxyCODONE (Oxy IR/ROXICODONE) immediate release tablet 5 mg, 5 mg, Oral, Q6H PRN, Vallarie Mare, MD, 5 mg at 09/30/20 1202   polyethylene glycol (MIRALAX / GLYCOLAX) packet 17 g, 17 g, Oral, Daily PRN, Vallarie Mare, MD   potassium chloride SA (KLOR-CON) CR tablet 20 mEq, 20 mEq, Oral, Daily, Vallarie Mare, MD, 20 mEq at 09/30/20 8119   rosuvastatin (CRESTOR) tablet 20 mg, 20 mg, Oral, Daily, Vallarie Mare, MD, 20 mg at 09/30/20 1478   sodium chloride flush (NS) 0.9 % injection 3 mL, 3 mL, Intravenous, Q12H, Vallarie Mare, MD, 3 mL at 09/30/20 0809   sodium chloride flush (NS) 0.9 % injection 3 mL, 3 mL,  Intravenous, PRN, Vallarie Mare, MD   sodium phosphate (FLEET) 7-19 GM/118ML enema 1 enema, 1 enema, Rectal, Once PRN, Vallarie Mare, MD   vitamin B-12 (CYANOCOBALAMIN) tablet 1,000 mcg, 1,000 mcg, Oral, Daily, Vallarie Mare, MD, 1,000 mcg at 09/30/20 2956   zinc sulfate capsule 220 mg, 220 mg, Oral, Daily, Vallarie Mare, MD, 220 mg at 09/30/20 2130   Patients Current Diet:  Diet Order                  Diet Carb Modified Fluid consistency: Thin; Room service appropriate? Yes  Diet effective now                         Precautions / Restrictions Precautions Precautions: Cervical Precaution Booklet Issued: No Precaution Comments: verbally reviewed all cervical precautions. Pt recalled 3/3. Cervical Brace: Soft collar Restrictions Weight Bearing Restrictions: No Other Position/Activity Restrictions: no lifting more than 5lbs    Has the patient had 2 or more falls or a fall with injury in the past year? Yes   Prior Activity Level: independent without device   Prior Functional Level Self Care: Did the patient need help bathing, dressing, using the toilet or eating? Independent   Indoor Mobility: Did the patient need assistance with walking from room to room (with or without device)? independent   Stairs: Did the patient need assistance with internal or external stairs (with or without device)? Independent   Functional Cognition: Did the patient need help planning regular tasks such as shopping or remembering to take medications? Independent   Home Assistive Devices / Equipment Home Equipment: Kasandra Knudsen - single point, Environmental consultant - 2 wheels, Transport planner, Other (comment)   Prior Device Use: Indicate devices/aids used by the patient prior to current illness, exacerbation or injury? Walker and only following surgery on  6/20.    Current Functional Level Cognition   Overall Cognitive Status: Within Functional Limits for tasks assessed Orientation Level: Oriented  X4 General Comments: Pt. was sleepy and stated he did not sleep well    Extremity Assessment (includes Sensation/Coordination)   Upper Extremity Assessment: Overall WFL for tasks assessed RUE Deficits / Details: Shoulder and elbow strength 5/5, grip strength 4/5 RUE Sensation: WNL RUE Coordination: decreased fine motor LUE Deficits / Details: LUE gross strength is 3/5 LUE Sensation: WNL LUE Coordination: decreased fine motor, decreased gross motor  Lower Extremity Assessment: Generalized weakness (at least 3/5 sitting EOB, functionally displays bilat knee buckling, standing tolerance <10 seconds)     ADLs   Overall ADL's : Needs assistance/impaired Eating/Feeding: Set up, Sitting Grooming: Wash/dry hands, Wash/dry face, Oral care, Applying deodorant, Set up, Sitting Upper Body Bathing: Minimal assistance, Sitting Lower Body Bathing: Moderate assistance, Sitting/lateral leans Upper Body Dressing : Set up, Sitting Lower Body Dressing: Sit to/from stand, Moderate assistance Toilet Transfer:  (Pt. refused to attempt to amb stated his legs would give out.) Toileting- Clothing Manipulation and Hygiene: Moderate assistance, Sitting/lateral lean Functional mobility during ADLs:  (Pt. was Min a with sit to stand from bed.) General ADL Comments: Pt. sat EOB for ADLs.     Mobility   Overal bed mobility: Needs Assistance Bed Mobility: Rolling, Sidelying to Sit Rolling: Min guard Sidelying to sit: Supervision Sit to sidelying: Supervision General bed mobility comments: for safety, cues for technique and use of bedrail     Transfers   Overall transfer level: Needs assistance Equipment used: Rolling walker (2 wheeled), Ambulation equipment used Transfer via Lift Equipment: Stedy Transfers: Sit to/from Stand Sit to Stand: Min assist, From elevated surface General transfer comment: Pt. was Min A to march in place and to side step up the head of bed. Pt. has a fear of falling.     Ambulation  / Gait / Stairs / Emergency planning/management officer   Ambulation/Gait General Gait Details: nt     Posture / Balance Dynamic Sitting Balance Sitting balance - Comments: able to sit EOB without PT support Balance Overall balance assessment: History of Falls, Needs assistance Sitting-balance support: No upper extremity supported, Feet supported Sitting balance-Leahy Scale: Good Sitting balance - Comments: able to sit EOB without PT support Standing balance support: Bilateral upper extremity supported, During functional activity Standing balance-Leahy Scale: Fair Standing balance comment: reliant on external support     Special needs/care consideration Diabetic management yes    Previous Home Environment (from acute therapy documentation) Living Arrangements: Spouse/significant other Available Help at Discharge: Family, Available 24 hours/day Type of Home: House Home Layout: Two level, 1/2 bath on main level Alternate Level Stairs-Rails: Right, Left, Can reach both Alternate Level Stairs-Number of Steps: Full-flight to 2nd level  - reports sleeping on couch downstairs Home Access: Stairs to enter Entrance Stairs-Rails: Right, Left, Can reach both Entrance Stairs-Number of Steps: 3 Bathroom Shower/Tub: Government social research officer Accessibility: Yes Additional Comments: reacher   Discharge Living Setting Plans for Discharge Living Setting: Patient's home Type of Home at Discharge: House Discharge Home Layout: Able to live on main level with bedroom/bathroom Discharge Home Access: Stairs to enter Entrance Stairs-Rails: Left, Right Entrance Stairs-Number of Steps: 3 Discharge Bathroom Shower/Tub: Tub/shower unit Discharge Bathroom Toilet: Standard Discharge Bathroom Accessibility: Yes How Accessible: Accessible via walker Does the patient have any problems obtaining your medications?: No   Social/Family/Support Systems Patient Roles: Partner Anticipated Caregiver:  Lenis Dickinson (s/o)  Anticipated Caregiver's Contact Information: [phone] Ability/Limitations of Caregiver: min assist Caregiver Availability: 24/7 Discharge Plan Discussed with Primary Caregiver: Yes Is Caregiver In Agreement with Plan?: Yes Does Caregiver/Family have Issues with Lodging/Transportation while Pt is in Rehab?: No   Goals Patient/Family Goal for Rehab: PT/OT supervision, SLP n/a Expected length of stay: 12-16 days Additional Information: frequent falls at home following ACDF Pt/Family Agrees to Admission and willing to participate: Yes Program Orientation Provided & Reviewed with Pt/Caregiver Including Roles  & Responsibilities: Yes  Barriers to Discharge: Insurance for SNF coverage   Decrease burden of Care through IP rehab admission: n/a   Possible need for SNF placement upon discharge: Not anticipated.    Patient Condition: I have reviewed medical records from [facility], spoken with CSW, and patient/significant other. I discussed via phone for inpatient rehabilitation assessment.  Patient will benefit from ongoing PT and OT, can actively participate in 3 hours of therapy a day 5 days of the week, and can make measurable gains during the admission.  Patient will also benefit from the coordinated team approach during an Inpatient Acute Rehabilitation admission.  The patient will receive intensive therapy as well as Rehabilitation physician, nursing, social worker, and care management interventions.  Due to safety, skin/wound care, disease management, medication administration, pain management, and patient education the patient requires 24 hour a day rehabilitation nursing.  The patient is currently min assist with mobility and basic ADLs.  Discharge setting and therapy post discharge at home with home health is anticipated.  Patient has agreed to participate in the Acute Inpatient Rehabilitation Program and will admit today.   Preadmission Screen Completed By:  Michel Santee, 09/30/2020 12:59 PM ______________________________________________________________________   Discussed status with Dr. Ranell Patrick on 09/30/20  at 1:10 PM  and received approval for admission today.   Admission Coordinator:  Michel Santee, PT, DPT  time 1:11 PM Sudie Grumbling 09/30/20     Assessment/Plan: Diagnosis: Cervical myelopathy Does the need for close, 24 hr/day Medical supervision in concert with the patient's rehab needs make it unreasonable for this patient to be served in a less intensive setting? Yes Co-Morbidities requiring supervision/potential complications: overweight (BMI 35.11), gout, hypertriglyceridemia, depression, essential HTN Due to bladder management, bowel management, safety, skin/wound care, disease management, medication administration, pain management, and patient education, does the patient require 24 hr/day rehab nursing? Yes Does the patient require coordinated care of a physician, rehab nurse, PT, OT to address physical and functional deficits in the context of the above medical diagnosis(es)? Yes Addressing deficits in the following areas: balance, endurance, locomotion, strength, transferring, bowel/bladder control, bathing, dressing, feeding, grooming, toileting, and psychosocial support Can the patient actively participate in an intensive therapy program of at least 3 hrs of therapy 5 days a week? Yes The potential for patient to make measurable gains while on inpatient rehab is excellent Anticipated functional outcomes upon discharge from inpatient rehab: modified independent PT, modified independent OT, modified independent SLP Estimated rehab length of stay to reach the above functional goals is: 2-3 weeks Anticipated discharge destination: Home 10. Overall Rehab/Functional Prognosis: excellent     MD Signature: Leeroy Cha, MD

## 2020-10-01 DIAGNOSIS — D72829 Elevated white blood cell count, unspecified: Secondary | ICD-10-CM

## 2020-10-01 DIAGNOSIS — E876 Hypokalemia: Secondary | ICD-10-CM

## 2020-10-01 LAB — GLUCOSE, CAPILLARY: Glucose-Capillary: 162 mg/dL — ABNORMAL HIGH (ref 70–99)

## 2020-10-01 MED ORDER — FUROSEMIDE 40 MG PO TABS: 40.0000 mg | ORAL_TABLET | Freq: Two times a day (BID) | ORAL | 3 refills | Status: DC | PRN

## 2020-10-01 MED ORDER — METHOCARBAMOL 500 MG PO TABS: 500.0000 mg | ORAL_TABLET | Freq: Four times a day (QID) | ORAL | 0 refills | Status: DC | PRN

## 2020-10-01 MED ORDER — LIDOCAINE 5 % EX PTCH: 1.0000 | MEDICATED_PATCH | CUTANEOUS | 0 refills | Status: DC

## 2020-10-01 MED ORDER — VITAMIN D (ERGOCALCIFEROL) 1.25 MG (50000 UNIT) PO CAPS: 50000.0000 [IU] | ORAL_CAPSULE | ORAL | 0 refills | Status: DC

## 2020-10-01 MED ORDER — DEXAMETHASONE 2 MG PO TABS: ORAL_TABLET | ORAL | 0 refills | Status: DC

## 2020-10-01 MED ORDER — DICLOFENAC SODIUM 1 % EX GEL: 1.0000 "application " | Freq: Three times a day (TID) | CUTANEOUS | 0 refills | Status: DC | PRN

## 2020-10-01 MED ORDER — DOCUSATE SODIUM 100 MG PO CAPS: 100.0000 mg | ORAL_CAPSULE | Freq: Two times a day (BID) | ORAL | 0 refills | Status: DC

## 2020-10-01 NOTE — Progress Notes (Signed)
Physical Therapy Note  Patient Details  Name: Harold [name] MRN: [id number] Date of Birth: [birth year] Today's Date: 10/01/2020    Today's Date: 10/01/2020 PT Missed Time: 79 Minutes Missed Time Reason: Other (Comment) (patient wants to go home)   Reginia Naas Magda Kiel, PT 10/01/2020, 8:51 AM

## 2020-10-01 NOTE — Progress Notes (Signed)
Occupational Therapy Note  Patient Details  Name: Harold [name] MRN: [id number] Date of Birth: Aug 15, 1965    Patient and family member present, pleasant and cooperative.  After introductions I explained OT role and services provided.    Patient states that he has had a few bad nights and is going home shortly.   He demonstrates awareness and insight to his decision to leave before therapy evaluations and recommendations.     Carlos Levering 10/01/2020, 10:39 AM

## 2020-10-01 NOTE — Progress Notes (Signed)
Patient discharged off of unit with all belongings. Discharge papers/instructions explained by physician assistant to family. Patient and family have no further questions at time of discharge. No complications noted at this time.  Lee-Anne Flicker L Qusai Kem  

## 2020-10-01 NOTE — Progress Notes (Signed)
Patient ID: Harold [name], male   DOB: 1965-08-01, 54 y.o.   MRN: [id number] Social Work will not see due to wanting to go home and only here 12 hours. No recommendations by therapy due to refused this am. MD reports medically stable for discharge.

## 2020-10-01 NOTE — Progress Notes (Signed)
PROGRESS NOTE   Subjective/Complaints:  Pt wishes to go home , states he wants to do therapy as OP.  States hiss fiancee can drive him and is at home with him during the day  ROS- no CP, SOB, N/V/D Objective:   No results found. No results for input(s): WBC, HGB, HCT, PLT in the last 72 hours. No results for input(s): NA, K, CL, CO2, GLUCOSE, BUN, CREATININE, CALCIUM in the last 72 hours.  Intake/Output Summary (Last 24 hours) at 10/01/2020 0830 Last data filed at 10/01/2020 0500 Gross per 24 hour  Intake 120 ml  Output 2200 ml  Net -2080 ml        Physical Exam: Vital Signs Blood pressure (!) 164/75, pulse (!) 50, temperature 98.4 F (36.9 C), resp. rate 17, height 5\' 11"  (1.803 m), weight 112.2 kg, SpO2 98 %.  General: No acute distress Mood and affect are appropriate Heart: Regular rate and rhythm no rubs murmurs or extra sounds Lungs: Clear to auscultation, breathing unlabored, no rales or wheezes Abdomen: Positive bowel sounds, soft nontender to palpation, nondistended Extremities: No clubbing, cyanosis, or edema Skin: No evidence of breakdown, no evidence of rash Neurologic: Cranial nerves II through XII intact, motor strength is 5/5 in bilateral deltoid, bicep, tricep, grip, hip flexor, knee extensors, ankle dorsiflexor and plantar flexor Sensory exam normal sensation to light touch and proprioception in bilateral upper and lower extremities  Musculoskeletal: Full range of motion in all 4 extremities. No joint swelling    Assessment/Plan: 1. Functional deficits cervical myelopathy  Stable for D/C today F/u PCP in [facility] F/u Neurosurgery Dr Saintclair Halsted 2 weeks See D/C summary See D/C instructions   Care Tool:  Bathing              Bathing assist       Upper Body Dressing/Undressing Upper body dressing   What is the patient wearing?: Hospital gown only    Upper body assist Assist Level: Set up assist     Lower Body Dressing/Undressing Lower body dressing      What is the patient wearing?: Hospital gown only     Lower body assist Assist for lower body dressing: Set up assist     [place] for toileting: Independent with assistive device Assistive Device Comment: condom cath   Transfers Chair/bed transfer  Transfers assist  Chair/bed transfer activity did not occur: Safety/medical concerns (Patient has a fear of falling.)        Locomotion Ambulation   Ambulation assist              Walk 10 feet activity   Assist           Walk 50 feet activity   Assist           Walk 150 feet activity   Assist           Walk 10 feet on uneven surface  activity   Assist           Wheelchair     Assist               Wheelchair 50 feet with  2 turns activity    Assist            Wheelchair 150 feet activity     Assist          Blood pressure (!) 164/75, pulse (!) 50, temperature 98.4 F (36.9 C), resp. rate 17, height 5\' 11"  (1.803 m), weight 112.2 kg, SpO2 98 %.    Medical Problem List and Plan: 1.  Cervical myelopathy- good strength , no neurogenic bowel or bladder, some balance problems  Per pt request will discharge with 24/7 sup/min A level, with rec for OP therapy vs HH Pt is competent             -patient may shower but incision must be covered    f/u neurosurgery Dr Saintclair Halsted And PCP 2.  Impaired mobility, SCI: continue Lovenox             -antiplatelet therapy: Low dose ASA 3. Chronic LBP/Pain Management: Was on percocet 10/325 mg qid with gabapentin 900 mg tid --Oxycodone prn--will increase to 15 mg as using dilaudid 0.5 mg tid prn. Add lidocaine patch 4. Mood: LCSW to follow for evaluation and suppor.t             -antipsychotic agents: N/A 5. Neuropsych: This patient is capable of making decisions on his own behalf. 6. Skin/Wound Care: Monitor wound for continued  healing. Routine pressure relief measures. 7. Fluids/Electrolytes/Nutrition: Monitor I/O. Check CMET in am. 8. HTN: Monitor BP tid--continue Norvasc, lasix and Zestril Vitals:   10/01/20 0309 10/01/20 0400  BP: (!) 181/94 (!) 164/75  Pulse: (!) 50   Resp: 17   Temp: 98.4 F (36.9 C)   SpO2: 98%    Cont BP meds , needs PCP visit next week  9. Hypokalemia: Increase supplement to bid due to lasix and decadron on board. 10. T2DM: Hgb A1c-6.6 (May '22).  Continue metformin  1000 mg/day --Monitor BS ac/hs and will use resistant scale as on steroids. 11. Peripheral neuropathy: Continue Gabapentin 12. H/o depression: Managed on Celexa. 13. Leucocytosis: Recheck CBC in am.  14. Right knee pain: ordered voltaren gel.     LOS: 1 days A FACE TO FACE EVALUATION WAS PERFORMED  Charlett Blake 10/01/2020, 8:30 AM

## 2020-10-01 NOTE — Plan of Care (Signed)
  Problem: Consults Goal: RH SPINAL CORD INJURY PATIENT EDUCATION Description:  See Patient Education module for education specifics.  Outcome: Completed/Met Goal: Skin Care Protocol Initiated - if Braden Score 18 or less Description: If consults are not indicated, leave blank or document N/A Outcome: Completed/Met Goal: Diabetes Guidelines if Diabetic/Glucose > 140 Description: If diabetic or lab glucose is > 140 mg/dl - Initiate Diabetes/Hyperglycemia Guidelines & Document Interventions  Outcome: Completed/Met   Problem: RH SKIN INTEGRITY Goal: RH STG ABLE TO PERFORM INCISION/WOUND CARE W/ASSISTANCE Description: STG Able To Perform Incision/Wound Care With Supervision Assistance. Outcome: Completed/Met   Problem: RH SAFETY Goal: RH STG ADHERE TO SAFETY PRECAUTIONS W/ASSISTANCE/DEVICE Description: STG Adhere to Safety Precautions With Supervision Assistance/Device. Outcome: Completed/Met Goal: RH STG DECREASED RISK OF FALL WITH ASSISTANCE Description: STG Decreased Risk of Fall With Supervision Assistance. Outcome: Completed/Met   Problem: RH PAIN MANAGEMENT Goal: RH STG PAIN MANAGED AT OR BELOW PT'S PAIN GOAL Description: < 4 on 0-10 pain scale. Outcome: Completed/Met   Problem: RH KNOWLEDGE DEFICIT SCI Goal: RH STG INCREASE KNOWLEDGE OF SELF CARE AFTER SCI Description: Patient will be able to demonstrate knowledge of medication management, pain management, skin/wound care, safety awareness with educational materials and handouts provided by staff independently at discharge. Outcome: Completed/Met

## 2020-10-01 NOTE — Progress Notes (Signed)
Pt states he is checking out today.  He says he can do rehab as outpatient.  Helyn Numbers., PA, made aware.

## 2020-10-01 NOTE — Discharge Summary (Signed)
Physician Discharge Summary  Patient ID: Harold [name] MRN: [id number] DOB/AGE: 1965-07-01 55 y.o.  Admit date: 09/30/2020 Discharge date: 10/01/2020  Discharge Diagnoses:  Active Problems:   Chronic pain syndrome   Type 2 diabetes mellitus (HCC)   Chronic low back pain   Cervical disc disease with myelopathy   Hypokalemia   Leucocytosis   Discharged Condition: stable   Significant Diagnostic Studies: N/A  Labs:  Basic Metabolic Panel: Recent Labs  Lab 09/27/20 0829  NA 138  K 3.4*  CL 101  CO2 26  GLUCOSE 137*  BUN 9  CREATININE 0.89  CALCIUM 9.2    CBC: Recent Labs  Lab 09/27/20 0829  WBC 12.1*  NEUTROABS 8.3*  HGB 14.3  HCT 43.6  MCV 95.0  PLT 508*    CBG: Recent Labs  Lab 09/30/20 0629 09/30/20 1109 09/30/20 1527 09/30/20 2119 10/01/20 0605  GLUCAP 148* 169* 309* 162* 162*    Brief HPI:   Harold [name] is a 55 y.o. male with history of HTN, T2DM, neuropathy, DDD s/p lumbar decompression, cervical myelopathy status post C3/C4 decompression for HNP with cord compression.  He underwent outpatient ACDF C3-C4 on 09/14/2020 by Dr. Saintclair Halsted.  Postop symptoms improved transiently but he was readmitted on 09/27/2020 with reports of back pain, numbness and swelling of hands as well as difficulty walking since fall on 09/22/20.  He continued to have multiple falls and was admitted for work-up.  MRI brain was negative.  MRI cervical spine showed ACDF with rim enhancement and small focus of central hypoenhancement.  WBC was noted to be mildly elevated but ESR was within normal limits.  He was started on Decadron taper with improvement in symptoms but continued to have functional limitations affecting mobility and ADLs.  CIR was recommended due to functional decline.   Hospital Course: Harold [name] was admitted to rehab 09/30/2020 for inpatient therapies to consist of PT and OT at least 3 hours 5 days a week.  He was maintained on Robaxin for muscle spasms and  lidocaine patch was added to lower back with reports of improvement in pain control.  Admission labs were ordered on 07/07 however patient refused this.  He also refused further hospitalization as he felt that he had been in the hospital for too long and preferred to go home with outpatient therapy.  Vitals were noted to be stable and he was afebrile.  Cervical incision was noted to be healing well.  He was advised to continue using potassium supplements daily for at least 2 to 3 days and follow-up with PCP for repeat labs due to recent issues with hypokalemia.  He felt that his pain was reasonably controlled and would resume his home pain regimen.  He was advised to use walker for safety with mobility, decadron taper as well as need for supervision for safety/fall prevention. He was felt to be medically stable for discharge on 10/01/2020.  Disposition: Home  Diet: Heart healthy/carb modified  Special Instructions: 1.  No driving or strenuous activity cleared by MD. 2.  Needs repeat BMET in 5-7 days to monitor potassium levels and CBC to monitor WBC trend.    Allergies as of 10/01/2020       Reactions   Other Itching, Swelling, Palpitations   Pecans: itching and swelling of the tongue    Diphenhydramine Itching, Palpitations, Other (See Comments)   "jittery" Pt states he can take Benadryl in an emergency situation   Pecan Nut (diagnostic)  Colchicine Diarrhea   Lipitor [atorvastatin] Other (See Comments)   Muscle cramps   Septra [sulfamethoxazole-trimethoprim] Itching        Medication List     STOP taking these medications    ibuprofen 800 MG tablet Commonly known as: ADVIL   tiZANidine 4 MG tablet Commonly known as: ZANAFLEX       TAKE these medications    amLODipine 10 MG tablet Commonly known as: NORVASC TAKE 1 TABLET(10 MG) BY MOUTH DAILY   ascorbic acid 500 MG tablet Commonly known as: VITAMIN C Take 500 mg by mouth daily.   aspirin EC 81 MG tablet Take 81  mg by mouth daily.   Calcium-Magnesium-Zinc-D3 Tabs Take 1 tablet by mouth daily.   citalopram 40 MG tablet Commonly known as: CELEXA Take 1 tablet (40 mg total) by mouth daily.   dexamethasone 2 MG tablet Commonly known as: DECADRON Take 2 pills every  8 hours till Friday. On 07/09- decrease to one pill every 8 hours for 3 days. On 07/12 decrease to one pill every 12 hours for 3 days. On 07/15- decrease to 1/2 tab daily till gone.   diclofenac Sodium 1 % Gel Commonly known as: VOLTAREN Apply 1 application topically 3 (three) times daily as needed (PAIN).   docusate sodium 100 MG capsule Commonly known as: COLACE Take 1 capsule (100 mg total) by mouth 2 (two) times daily. Purchase over the counter   fenofibrate 145 MG tablet Commonly known as: TRICOR Take 1 tablet (145 mg total) by mouth daily.   fluticasone 50 MCG/ACT nasal spray Commonly known as: FLONASE Place 2 sprays into both nostrils daily.   furosemide 40 MG tablet Commonly known as: LASIX Take 1 tablet (40 mg total) by mouth 2 (two) times daily as needed. What changed:  when to take this reasons to take this   gabapentin 300 MG capsule Commonly known as: NEURONTIN Take 900 mg by mouth 3 (three) times daily.   glucose blood test strip Use as instructed once daily E119   Lancets Misc Use as directed daily E11.9   lidocaine 5 % Commonly known as: LIDODERM Place 1 patch onto the skin daily. Can apply to back on at 8 am and off at 8 pm --has to be off for 12 hours. Purchase over the counter.   lisinopril 40 MG tablet Commonly known as: ZESTRIL TAKE ONE TABLET BY MOUTH DAILY   loratadine 10 MG tablet Commonly known as: CLARITIN Take 1 tablet (10 mg total) by mouth daily as needed for allergies.   magnesium oxide 400 MG tablet Commonly known as: MAG-OX Take 400 mg by mouth daily.   metFORMIN 500 MG 24 hr tablet Commonly known as: GLUCOPHAGE-XR TAKE TWO TABLETS BY MOUTH DAILY WITH BREAKFAST    methocarbamol 500 MG tablet Commonly known as: ROBAXIN Take 1 tablet (500 mg total) by mouth every 6 (six) hours as needed for muscle spasms. What changed: when to take this   [place] 2 w/Device Kit Use as directed daily E11.9   oxyCODONE-acetaminophen 10-325 MG tablet Commonly known as: PERCOCET Take 1 tablet by mouth every 6 (six) hours as needed for pain.   Potassium Chloride ER 20 MEQ Tbcr TAKE ONE TABLET BY MOUTH DAILY Notes to patient: Use daily for 3 days to help with low potassium levels   rosuvastatin 20 MG tablet Commonly known as: CRESTOR TAKE 1 TABLET(20 MG) BY MOUTH DAILY   sildenafil 100 MG tablet Commonly known as: Viagra Take 0.5-1  tablets (50-100 mg total) by mouth daily as needed for erectile dysfunction.   vitamin B-12 1000 MCG tablet Commonly known as: CYANOCOBALAMIN Take 1 tablet (1,000 mcg total) by mouth daily.   Vitamin D (Ergocalciferol) 1.25 MG (50000 UNIT) Caps capsule Commonly known as: DRISDOL Take 1 capsule (50,000 Units total) by mouth every 7 (seven) days. Start taking on: October 08, 2020   zinc gluconate 50 MG tablet Take 50 mg by mouth daily.        Follow-up Information     Biagio Borg, MD. Call.   Specialties: Internal Medicine, Radiology Why: for post hospital follow up and recheck of potassium levels Contact information: [place] [address] [phone]         Courtney Heys, MD Follow up.   Specialty: Physical Medicine and Rehabilitation Why: as needed Contact information: [address] [place] [address] [phone]         Kary Kos, MD. Call.   Specialty: Neurosurgery Why: for post op appointment Contact information: [address] [place] [address] [place] [address] [phone]                 Signed: Bary Leriche 10/01/2020, 6:43 PM

## 2020-10-01 NOTE — Discharge Instructions (Addendum)
Inpatient Rehab Discharge Instructions  ALASDAIR KLEVE Discharge date and time: 10/01/20   Activities/Precautions/ Functional Status: Activity: no lifting, driving, or strenuous exercise  till cleared by Dr. Saintclair Halsted Diet: diabetic diet Wound Care: keep wound clean and dry  Functional status:  ___ No restrictions     ___ Walk up steps independently _X__ 24/7 supervision/assistance   ___ Walk up steps with assistance ___ Intermittent supervision/assistance  ___ Bathe/dress independently ___ Walk with walker     _X__ Bathe/dress with assistance ___ Walk Independently    ___ Shower independently _X__ Walk with assistance    ___ Shower with assistance _X__ No alcohol     ___ Return to work/school ________  Special Instructions:    My questions have been answered and I understand these instructions. I will adhere to these goals and the provided educational materials after my discharge from the hospital.  Patient/Caregiver Signature _______________________________ Date __________  Clinician Signature _______________________________________ Date __________  Please bring this form and your medication list with you to all your follow-up doctor's appointments.

## 2020-10-02 ENCOUNTER — Telehealth: Payer: Self-pay

## 2020-10-02 NOTE — Telephone Encounter (Signed)
Transition Care Management Follow-up Telephone Call Date of discharge and from where: 7/722 [facility] How have you been since you were released from the hospital? Pt states he is doing okay; c/o pain 7/10. Pt was dissatisfied with care received during admission to hospital and rehab and requested outpatient therapy.  Any questions or concerns? No  Items Reviewed: Did the pt receive and understand the discharge instructions provided? Yes  Medications obtained and verified? Yes  Other? No  Any new allergies since your discharge? No  Dietary orders reviewed? Yes Do you have support at home? Yes   Home Care and Equipment/Supplies: Were home health services ordered? No - pt states he would like home health physical therapy, occupation therapy and home health aide but orders for these services were not set up through hospital due to patient refusal of services and request for outpatient therapy.   Were any new equipment or medical supplies ordered?  No pt states he was supposed to receive shower chair; may need DME rx; advised pt to contact [employer] for coverage and DME supplier  Functional Questionnaire: (I = Independent and D = Dependent) ADLs: D  Bathing/Dressing- D  Meal Prep- D  Eating- I  Maintaining continence- I   Transferring/Ambulation- I - with walker or electric wheelchair  Managing Meds- I  Follow up appointments reviewed:  PCP Hospital f/u appt confirmed? Yes  Scheduled to see Dr. Jenny Reichmann on 10/12/20 @ 9:40. [facility] f/u appt confirmed? No  pt to schedule appt with Dr. Saintclair Halsted Are transportation arrangements needed? No  If their condition worsens, is the pt aware to call PCP or go to the Emergency Dept.? Yes Was the patient provided with contact information for the PCP's office or ED? Yes Was to pt encouraged to call back with questions or concerns? Yes

## 2020-10-07 ENCOUNTER — Other Ambulatory Visit: Payer: Self-pay | Admitting: *Deleted

## 2020-10-07 ENCOUNTER — Encounter: Payer: Self-pay | Admitting: Internal Medicine

## 2020-10-07 ENCOUNTER — Other Ambulatory Visit: Payer: Self-pay | Admitting: Cardiovascular Disease

## 2020-10-07 DIAGNOSIS — G4733 Obstructive sleep apnea (adult) (pediatric): Secondary | ICD-10-CM

## 2020-10-08 MED ORDER — DOXYCYCLINE HYCLATE 100 MG PO TABS: 100.0000 mg | ORAL_TABLET | Freq: Two times a day (BID) | ORAL | 0 refills | Status: DC

## 2020-10-11 ENCOUNTER — Ambulatory Visit (HOSPITAL_BASED_OUTPATIENT_CLINIC_OR_DEPARTMENT_OTHER): Payer: Medicare HMO | Attending: Cardiovascular Disease | Admitting: Cardiovascular Disease

## 2020-10-11 ENCOUNTER — Encounter (HOSPITAL_BASED_OUTPATIENT_CLINIC_OR_DEPARTMENT_OTHER): Payer: Medicare HMO | Admitting: Cardiovascular Disease

## 2020-10-12 ENCOUNTER — Telehealth: Payer: Self-pay

## 2020-10-12 ENCOUNTER — Encounter (HOSPITAL_BASED_OUTPATIENT_CLINIC_OR_DEPARTMENT_OTHER): Payer: Medicare HMO | Admitting: Cardiovascular Disease

## 2020-10-12 ENCOUNTER — Other Ambulatory Visit: Payer: Self-pay

## 2020-10-12 ENCOUNTER — Encounter: Payer: Self-pay | Admitting: Internal Medicine

## 2020-10-12 ENCOUNTER — Ambulatory Visit (INDEPENDENT_AMBULATORY_CARE_PROVIDER_SITE_OTHER): Payer: Medicare HMO | Admitting: Internal Medicine

## 2020-10-12 VITALS — BP 140/80 | HR 64 | Temp 98.5°F | Ht 71.0 in | Wt 256.8 lb

## 2020-10-12 DIAGNOSIS — L989 Disorder of the skin and subcutaneous tissue, unspecified: Secondary | ICD-10-CM

## 2020-10-12 DIAGNOSIS — Z23 Encounter for immunization: Secondary | ICD-10-CM | POA: Diagnosis not present

## 2020-10-12 DIAGNOSIS — J329 Chronic sinusitis, unspecified: Secondary | ICD-10-CM | POA: Diagnosis not present

## 2020-10-12 DIAGNOSIS — E876 Hypokalemia: Secondary | ICD-10-CM

## 2020-10-12 DIAGNOSIS — G959 Disease of spinal cord, unspecified: Secondary | ICD-10-CM

## 2020-10-12 DIAGNOSIS — N3281 Overactive bladder: Secondary | ICD-10-CM | POA: Insufficient documentation

## 2020-10-12 DIAGNOSIS — J309 Allergic rhinitis, unspecified: Secondary | ICD-10-CM | POA: Diagnosis not present

## 2020-10-12 DIAGNOSIS — K921 Melena: Secondary | ICD-10-CM | POA: Diagnosis not present

## 2020-10-12 LAB — URINALYSIS, ROUTINE W REFLEX MICROSCOPIC
Bilirubin Urine: NEGATIVE
Hgb urine dipstick: NEGATIVE
Ketones, ur: NEGATIVE
Leukocytes,Ua: NEGATIVE
Nitrite: NEGATIVE
RBC / HPF: NONE SEEN (ref 0–?)
Specific Gravity, Urine: 1.02 (ref 1.000–1.030)
Total Protein, Urine: NEGATIVE
Urine Glucose: NEGATIVE
Urobilinogen, UA: 1 (ref 0.0–1.0)
pH: 6 (ref 5.0–8.0)

## 2020-10-12 LAB — BASIC METABOLIC PANEL
BUN: 19 mg/dL (ref 6–23)
CO2: 30 mEq/L (ref 19–32)
Calcium: 9.4 mg/dL (ref 8.4–10.5)
Chloride: 101 mEq/L (ref 96–112)
Creatinine, Ser: 0.97 mg/dL (ref 0.40–1.50)
GFR: 89.31 mL/min (ref >60.00)
Glucose, Bld: 104 mg/dL — ABNORMAL HIGH (ref 70–99)
Potassium: 4.1 mEq/L (ref 3.5–5.1)
Sodium: 139 mEq/L (ref 135–145)

## 2020-10-12 LAB — HEPATIC FUNCTION PANEL
ALT: 13 U/L (ref 0–53)
AST: 13 U/L (ref 0–37)
Albumin: 4 g/dL (ref 3.5–5.2)
Alkaline Phosphatase: 43 U/L (ref 39–117)
Bilirubin, Direct: 0.1 mg/dL (ref 0.0–0.3)
Total Bilirubin: 0.3 mg/dL (ref 0.2–1.2)
Total Protein: 6.4 g/dL (ref 6.0–8.3)

## 2020-10-12 LAB — CBC WITH DIFFERENTIAL/PLATELET
Basophils Absolute: 0 10*3/uL (ref 0.0–0.1)
Basophils Relative: 0.2 % (ref 0.0–3.0)
Eosinophils Absolute: 0.3 10*3/uL (ref 0.0–0.7)
Eosinophils Relative: 1.6 % (ref 0.0–5.0)
HCT: 40.7 % (ref 39.0–52.0)
Hemoglobin: 13.3 g/dL (ref 13.0–17.0)
Lymphocytes Relative: 21.7 % (ref 12.0–46.0)
Lymphs Abs: 4.2 10*3/uL — ABNORMAL HIGH (ref 0.7–4.0)
MCHC: 32.6 g/dL (ref 30.0–36.0)
MCV: 95.7 fl (ref 78.0–100.0)
Monocytes Absolute: 2 10*3/uL — ABNORMAL HIGH (ref 0.1–1.0)
Monocytes Relative: 10.5 % (ref 3.0–12.0)
Neutro Abs: 12.8 10*3/uL — ABNORMAL HIGH (ref 1.4–7.7)
Neutrophils Relative %: 66 % (ref 43.0–77.0)
Platelets: 393 10*3/uL (ref 150.0–400.0)
RBC: 4.26 Mil/uL (ref 4.22–5.81)
RDW: 14.2 % (ref 11.5–15.5)
WBC: 19.3 10*3/uL (ref 4.0–10.5)

## 2020-10-12 MED ORDER — METHYLPREDNISOLONE ACETATE 80 MG/ML IJ SUSP
80.0000 mg | Freq: Once | INTRAMUSCULAR | Status: AC
  Administered 2020-10-12: 80 mg via INTRAMUSCULAR

## 2020-10-12 MED ORDER — HYDROCORTISONE ACETATE 25 MG RE SUPP: 25.0000 mg | Freq: Two times a day (BID) | RECTAL | 1 refills | Status: DC

## 2020-10-12 MED ORDER — TOLTERODINE TARTRATE ER 4 MG PO CP24: 4.0000 mg | ORAL_CAPSULE | Freq: Every day | ORAL | 3 refills | Status: DC

## 2020-10-12 NOTE — Progress Notes (Deleted)
Patient ID: Harold [name], male   DOB: 09-Mar-1966, 55 y.o.   MRN: [id number]

## 2020-10-12 NOTE — Patient Instructions (Addendum)
You had the Prevnar 20 pneumonia shot today  You had the steroid shot today  OK to finish the antibiotic you have for the sinus  Please take all new medication as prescribed - the suppository sent to the pharmacy  Please continue all other medications as before, and refills have been done if requested.  Please have the pharmacy call with any other refills you may need.  Please keep your appointments with your specialists as you may have planned - Dr Saintclair Halsted for the surgury and arm weakness  Please go to the LAB at the blood drawing area for the tests to be done  You will be contacted by phone if any changes need to be made immediately.  Otherwise, you will receive a letter about your results with an explanation, but please check with MyChart first.  Please remember to sign up for MyChart if you have not done so, as this will be important to you in the future with finding out test results, communicating by private email, and scheduling acute appointments online when needed.

## 2020-10-12 NOTE — Progress Notes (Signed)
Patient ID: Harold [name], male   DOB: 1965-05-27, 55 y.o.   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  The hospital course was routine. There were no complications per Dr Hassel Neth.  The patient was admitted on 09/27/2020 with worsening myelopathy symptoms. The hospital course was routine. There were no complications. The patient got a little better with steroids.  He was d/c to inpatient rehab July 6, but pt left July 7 as did not feel his care was more than he could do at home.  His course was complicated by mild low K and this is recommended for f/u today.   He felt that his pain was reasonably controlled and would resume his home pain regimen.  He was advised to use walker for safety with mobility, decadron taper as well as need for supervision for safety/fall prevention.  Pt did call here shortly post d/c with sinusitis symptoms, now improved and plans to finish his doxycycline course.  Does have several wks ongoing nasal allergy symptoms with clearish congestion, itch and sneezing, without fever, pain, ST, cough, swelling or wheezing.  Denies urinary symptoms such as dysuria, frequency, urgency, flank pain, hematuria or n/v, fever, chills, except for urinary frequency due to OAB and asks for restart detrol LA he has run out.  Did also have an episode last PM of small volume BRBPR but Denies worsening reflux, abd pain, dysphagia, n/v, though had some constipation with straining at the time.  Last BM was this AM with no overt bleeding and he does not want GI referral.  Last colonoscopy 2020 completely normal, without even evidence for hemorrhoid or diverticular dz.  Pt has no other new complaints.  He has appt and plan to f/u NS Dr Ronnald Ramp.  Incidentally  due for prevar 20 today  Wt Readings from Last 3 Encounters:  10/12/20 256 lb 12.8 oz (116.5 kg)  09/30/20 247 lb 5.7 oz (112.2 kg)  09/27/20 251 lb 12.3 oz (114.2 kg)   BP Readings from Last 3 Encounters:  10/12/20 (!) 148/92  10/01/20 (!) 164/75  09/30/20 (!) 143/75         Past Medical History:  Diagnosis Date   Acute idiopathic pericarditis 04/08/2010   Qualifier: Diagnosis of  By: [name], [name]     Acute sinusitis 06/04/2014   Anxiety    CHF (congestive heart failure) ([place]) 06/29/2010   was pericardititis not CHF   Chronic back pain    Constipation    DDD (degenerative disc disease), lumbar 09/14/2012   Depression    Erectile dysfunction 09/14/2012   GERD (gastroesophageal reflux disease)    ocassional   Gout, unspecified 05/01/2007   Qualifier: Diagnosis of  By: [name] MD, Dellis Filbert A    Headache    migraines in the past   History of pancreatitis    a. admx 04-2009.Marland KitchenMarland Kitchen? 2-2 triglycerides   Hypertension    Hypertriglyceridemia    a. followed by [facility]   Left sided sciatica 09/14/2012   Neuropathy    Obesity    OSA (obstructive sleep apnea)    CPAP- not current   Restless legs    Type 2 diabetes mellitus with diabetic neuropathy, unspecified ([place]) 09/20/2019   Past  Surgical History:  Procedure Laterality Date   ANTERIOR CERVICAL DECOMP/DISCECTOMY FUSION N/A 09/14/2020   Procedure: Anterior Cervical Decompression Fusion - Cervical three-Cervical four;  Surgeon: Kary Kos, MD;  Location: [place];  Service: Neurosurgery;  Laterality: N/A;   COLONOSCOPY     KNEE SURGERY Right    x3 scopes   KNEE SURGERY Right    open menisicus repair   LUMBAR LAMINECTOMY/DECOMPRESSION MICRODISCECTOMY N/A 08/26/2015   Procedure: Lumbar Two-Sacral One  Laminectomy for decompression;  Surgeon: [name], MD;  Location: [place] [facility];  Service: Neurosurgery;  Laterality: N/A;   pericardectomy  07/15/2010   Dr. [name]   pleurx catheter placement  07/15/2010   Dr. [name]    SHOULDER SURGERY     right and left shoulders- Rotator cuff repair   TONSILLECTOMY AND ADENOIDECTOMY     one tonsil    reports that he has been smoking cigarettes. He has a 11.88 pack-year smoking history. He has never used smokeless tobacco. He reports current alcohol use. He reports that he does not use drugs. family history includes Colon cancer (age of onset: 39) in his father; Diabetes in his father, mother, and sister; Heart disease (age of onset: 67) in his father; Hypertension in an other family member; Liver cancer in his paternal uncle; Pancreatitis in his father and paternal uncle; Stomach cancer in his maternal grandfather. Allergies  Allergen Reactions   Other Itching, Swelling and Palpitations    Pecans: itching and swelling of the tongue    Diphenhydramine Itching, Palpitations and Other (See Comments)    "jittery" Pt states he can take Benadryl in an emergency situation   Pecan Nut (Diagnostic)    Colchicine Diarrhea   Lipitor [Atorvastatin] Other (See Comments)    Muscle cramps   Septra [Sulfamethoxazole-Trimethoprim] Itching   Current Outpatient Medications on File Prior to Visit  Medication Sig Dispense Refill   amLODipine (NORVASC) 10 MG tablet TAKE 1 TABLET(10 MG) BY MOUTH DAILY 30 tablet 0   ascorbic acid (VITAMIN C) 500 MG tablet Take 500 mg by mouth daily.     aspirin EC 81 MG tablet Take 81 mg by mouth daily.     Blood Glucose Monitoring Suppl (ONE TOUCH ULTRA 2) w/Device KIT Use as directed daily E11.9 1 each 0   citalopram (CELEXA) 40 MG tablet Take 1 tablet (40 mg total) by mouth daily. 90 tablet 1   dexamethasone (DECADRON) 2 MG tablet Take 2 pills every  8 hours till Friday. On 07/09- decrease to one pill every 8 hours for 3 days. On 07/12 decrease to one pill every 12 hours for 3 days. On 07/15- decrease to 1/2 tab daily till gone. 28 tablet 0   diclofenac Sodium (VOLTAREN) 1 % GEL Apply 1 application topically 3 (three) times daily as needed (PAIN). 150 g 0    docusate sodium (COLACE) 100 MG capsule Take 1 capsule (100 mg total) by mouth 2 (two) times daily. Purchase over the counter 60 capsule 0   doxycycline (VIBRA-TABS) 100 MG tablet Take 1 tablet (100 mg total) by mouth 2 (two) times daily. 20 tablet 0   fenofibrate (TRICOR) 145 MG tablet Take 1 tablet (145 mg total) by mouth daily. 90 tablet 1   fluticasone (FLONASE) 50 MCG/ACT nasal spray Place 2 sprays into both nostrils daily. 16 g 6   furosemide (LASIX) 40 MG tablet Take 1 tablet (40 mg total) by mouth 2 (two) times daily as needed. 90 tablet 3   gabapentin (NEURONTIN)  300 MG capsule Take 900 mg by mouth 3 (three) times daily.     glucose blood test strip Use as instructed once daily E119 100 each 12   Lancets MISC Use as directed daily E11.9 100 each 11   lidocaine (LIDODERM) 5 % Place 1 patch onto the skin daily. Can apply to back on at 8 am and off at 8 pm --has to be off for 12 hours. Purchase over the counter. 30 patch 0   lisinopril (ZESTRIL) 40 MG tablet TAKE ONE TABLET BY MOUTH DAILY 90 tablet 2   loratadine (CLARITIN) 10 MG tablet Take 1 tablet (10 mg total) by mouth daily as needed for allergies. 90 tablet 3   magnesium oxide (MAG-OX) 400 MG tablet Take 400 mg by mouth daily.     metFORMIN (GLUCOPHAGE-XR) 500 MG 24 hr tablet TAKE TWO TABLETS BY MOUTH DAILY WITH BREAKFAST 180 tablet 3   methocarbamol (ROBAXIN) 500 MG tablet Take 1 tablet (500 mg total) by mouth every 6 (six) hours as needed for muscle spasms. 90 tablet 0   Multiple Minerals-Vitamins (CALCIUM-MAGNESIUM-ZINC-D3) TABS Take 1 tablet by mouth daily.     oxyCODONE-acetaminophen (PERCOCET) 10-325 MG tablet Take 1 tablet by mouth every 6 (six) hours as needed for pain. 30 tablet 0   Potassium Chloride ER 20 MEQ TBCR TAKE ONE TABLET BY MOUTH DAILY 90 tablet 2   rosuvastatin (CRESTOR) 20 MG tablet TAKE 1 TABLET(20 MG) BY MOUTH DAILY 30 tablet 0   sildenafil (VIAGRA) 100 MG tablet Take 0.5-1 tablets (50-100 mg total) by mouth  daily as needed for erectile dysfunction. 5 tablet 11   vitamin B-12 (CYANOCOBALAMIN) 1000 MCG tablet Take 1 tablet (1,000 mcg total) by mouth daily. 90 tablet 1   Vitamin D, Ergocalciferol, (DRISDOL) 1.25 MG (50000 UNIT) CAPS capsule Take 1 capsule (50,000 Units total) by mouth every 7 (seven) days. 5 capsule 0   zinc gluconate 50 MG tablet Take 50 mg by mouth daily.     No current facility-administered medications on file prior to visit.        ROS:  All others reviewed and negative.  Objective        PE:  BP (!) 148/92 (BP Location: Right Arm, Patient Position: Sitting, Cuff Size: Large)   Pulse 64   Temp 98.5 F (36.9 C) (Oral)   Ht '5\' 11"'  (1.803 m)   Wt 256 lb 12.8 oz (116.5 kg)   SpO2 96%   BMI 35.82 kg/m                 Constitutional: Pt appears in NAD               HENT: Head: NCAT.                Right Ear: External ear normal.                 Left Ear: External ear normal. Bilat tm's with mild erythema.  Max sinus areas none tender.  Pharynx with mild erythema, no exudate               Eyes: . Pupils are equal, round, and reactive to light. Conjunctivae and EOM are normal               Nose: without d/c or deformity               Neck: Neck supple. Gross normal ROM  Cardiovascular: Normal rate and regular rhythm.                 Pulmonary/Chest: Effort normal and breath sounds without rales or wheezing.                Abd:  Soft, NT, ND, + BS, no organomegaly               Neurological: Pt is alert. At baseline orientation, motor grossly intact except for mild bilateral UE weakness right > left               Skin: Skin is warm. No rashes, no other new lesions, LE edema - none               Psychiatric: Pt behavior is normal without agitation   Micro: none  Cardiac tracings I have personally interpreted today:  none  Pertinent Radiological findings (summarize): none   Lab Results  Component Value Date   WBC 12.1 (H) 09/27/2020   HGB 14.3 09/27/2020    HCT 43.6 09/27/2020   PLT 508 (H) 09/27/2020   GLUCOSE 137 (H) 09/27/2020   CHOL 150 08/07/2020   TRIG 282.0 (H) 08/07/2020   HDL 27.60 (L) 08/07/2020   LDLDIRECT 76.0 08/07/2020   LDLCALC 85 08/07/2019   ALT 15 09/27/2020   AST 21 09/27/2020   NA 138 09/27/2020   K 3.4 (L) 09/27/2020   CL 101 09/27/2020   CREATININE 0.89 09/27/2020   BUN 9 09/27/2020   CO2 26 09/27/2020   TSH 0.87 08/07/2020   PSA 1.77 08/07/2020   INR 1.72 (H) 07/05/2010   HGBA1C 6.6 (H) 08/07/2020   MICROALBUR 4.9 (H) 08/07/2020   Assessment/Plan:  Harold [name] is a 55 y.o. Black or African American [2] male with  has a past medical history of Acute idiopathic pericarditis (04/08/2010), Acute sinusitis (06/04/2014), Anxiety, CHF (congestive heart failure) ([place]) (06/29/2010), Chronic back pain, Constipation, DDD (degenerative disc disease), lumbar (09/14/2012), Depression, Erectile dysfunction (09/14/2012), GERD (gastroesophageal reflux disease), Gout, unspecified (05/01/2007), Headache, History of pancreatitis, Hypertension, Hypertriglyceridemia, Left sided sciatica (09/14/2012), Neuropathy, Obesity, OSA (obstructive sleep apnea), Restless legs, and Type 2 diabetes mellitus with diabetic neuropathy, unspecified ([place]) (09/20/2019).  No problem-specific Assessment & Plan notes found for this encounter.  Followup: No follow-ups on file.  Cathlean Cower, MD 10/12/2020 10:07 AM [facility]

## 2020-10-14 ENCOUNTER — Encounter: Payer: Self-pay | Admitting: Internal Medicine

## 2020-10-14 DIAGNOSIS — K921 Melena: Secondary | ICD-10-CM | POA: Insufficient documentation

## 2020-10-14 NOTE — Assessment & Plan Note (Addendum)
I suspect anal fissure related vs hemorrhoid, pt declines GI referral, will be for anusol HC supp's, and CBC with labs but likley self limited, and pt to avoid straining at stool and constipation

## 2020-10-14 NOTE — Assessment & Plan Note (Signed)
Resolving, cont to finish doxy course,  to f/u any worsening symptoms or concerns

## 2020-10-14 NOTE — Assessment & Plan Note (Signed)
Exam benign, ok for detrol la restart,  to f/u any worsening symptoms or concerns

## 2020-10-14 NOTE — Assessment & Plan Note (Signed)
Plan is for f/u lab today,  to f/u any worsening symptoms or concerns

## 2020-10-14 NOTE — Assessment & Plan Note (Signed)
Mild to mod, for depomedrol im 80,  to f/u any worsening symptoms or concerns

## 2020-10-14 NOTE — Assessment & Plan Note (Signed)
Pt plans to f/u with NS as planned

## 2020-10-16 DIAGNOSIS — M961 Postlaminectomy syndrome, not elsewhere classified: Secondary | ICD-10-CM | POA: Diagnosis not present

## 2020-10-16 DIAGNOSIS — R2689 Other abnormalities of gait and mobility: Secondary | ICD-10-CM | POA: Diagnosis not present

## 2020-10-16 DIAGNOSIS — M545 Low back pain, unspecified: Secondary | ICD-10-CM | POA: Diagnosis not present

## 2020-10-16 DIAGNOSIS — G9009 Other idiopathic peripheral autonomic neuropathy: Secondary | ICD-10-CM | POA: Diagnosis not present

## 2020-10-16 DIAGNOSIS — G894 Chronic pain syndrome: Secondary | ICD-10-CM | POA: Diagnosis not present

## 2020-10-19 MED ORDER — FLUCONAZOLE 100 MG PO TABS: 100.0000 mg | ORAL_TABLET | Freq: Every day | ORAL | 0 refills | Status: DC

## 2020-10-20 DIAGNOSIS — G959 Disease of spinal cord, unspecified: Secondary | ICD-10-CM | POA: Diagnosis not present

## 2020-10-20 NOTE — Addendum Note (Signed)
Addended by: Biagio Borg on: 10/20/2020 11:21 AM   Modules accepted: Orders

## 2020-10-29 DIAGNOSIS — M7051 Other bursitis of knee, right knee: Secondary | ICD-10-CM | POA: Diagnosis not present

## 2020-10-29 DIAGNOSIS — M25561 Pain in right knee: Secondary | ICD-10-CM | POA: Diagnosis not present

## 2020-10-29 DIAGNOSIS — Z79899 Other long term (current) drug therapy: Secondary | ICD-10-CM | POA: Diagnosis not present

## 2020-10-29 DIAGNOSIS — R030 Elevated blood-pressure reading, without diagnosis of hypertension: Secondary | ICD-10-CM | POA: Diagnosis not present

## 2020-10-29 DIAGNOSIS — F1120 Opioid dependence, uncomplicated: Secondary | ICD-10-CM | POA: Diagnosis not present

## 2020-10-29 DIAGNOSIS — Z6836 Body mass index (BMI) 36.0-36.9, adult: Secondary | ICD-10-CM | POA: Diagnosis not present

## 2020-11-02 ENCOUNTER — Other Ambulatory Visit: Payer: Self-pay | Admitting: Physical Medicine and Rehabilitation

## 2020-11-11 ENCOUNTER — Telehealth: Payer: Self-pay | Admitting: Internal Medicine

## 2020-11-11 NOTE — Telephone Encounter (Signed)
Left message for patient to call me back at [phone] to schedule Medicare Annual Wellness Visit   No hx of AWV eligible as of 03/28/13  Please schedule at anytime with [facility] Advisor if patient calls the office back.    45 Minutes appointment   Any questions, please call me at [phone]  

## 2020-11-16 ENCOUNTER — Other Ambulatory Visit: Payer: Self-pay | Admitting: Internal Medicine

## 2020-11-16 NOTE — Telephone Encounter (Signed)
Please refill as per office routine med refill policy (all routine meds refilled for 3 mo or monthly per pt preference up to one year from last visit, then month to month grace period for 3 mo, then further med refills will have to be denied)  

## 2020-11-25 ENCOUNTER — Other Ambulatory Visit: Payer: Self-pay | Admitting: Internal Medicine

## 2020-11-25 DIAGNOSIS — M1711 Unilateral primary osteoarthritis, right knee: Secondary | ICD-10-CM | POA: Diagnosis not present

## 2020-11-25 DIAGNOSIS — G894 Chronic pain syndrome: Secondary | ICD-10-CM | POA: Diagnosis not present

## 2020-11-25 NOTE — Telephone Encounter (Signed)
Please refill as per office routine med refill policy (all routine meds to be refilled for 3 mo or monthly (per pt preference) up to one year from last visit, then month to month grace period for 3 mo, then further med refills will have to be denied) ? ?

## 2020-12-21 ENCOUNTER — Ambulatory Visit (HOSPITAL_BASED_OUTPATIENT_CLINIC_OR_DEPARTMENT_OTHER): Payer: Medicare HMO | Attending: Cardiovascular Disease | Admitting: Cardiovascular Disease

## 2020-12-21 ENCOUNTER — Other Ambulatory Visit: Payer: Self-pay

## 2020-12-21 DIAGNOSIS — G4733 Obstructive sleep apnea (adult) (pediatric): Secondary | ICD-10-CM

## 2020-12-21 DIAGNOSIS — R0902 Hypoxemia: Secondary | ICD-10-CM | POA: Insufficient documentation

## 2020-12-31 ENCOUNTER — Encounter (HOSPITAL_BASED_OUTPATIENT_CLINIC_OR_DEPARTMENT_OTHER): Payer: Self-pay | Admitting: Cardiovascular Disease

## 2020-12-31 NOTE — Procedures (Signed)
Patient Name: Harold [name], Harold [name] Date: 12/21/2020 Gender: Male D.O.B: 02-14-1966 Age (years): 32 Referring Provider: Shelva Majestic MD, ABSM Height (inches): 71 Interpreting Physician: Shelva Majestic MD, ABSM Weight (lbs): 252 RPSGT: Jorge Ny BMI: 35 MRN: [id number] Neck Size: 19.50  CLINICAL INFORMATION Sleep Study Type: NPSG  Indication for sleep study: Congestive Heart Failure, Depression, Diabetes, Hypertension, Insomnia, Morbid Obesity, Re-Evaluation, Snoring  Epworth Sleepiness Score: 6  SLEEP STUDY TECHNIQUE As per the AASM Manual for the Scoring of Sleep and Associated Events v2.3 (April 2016) with a hypopnea requiring 4% desaturations.  The channels recorded and monitored were frontal, central and occipital EEG, electrooculogram (EOG), submentalis EMG (chin), nasal and oral airflow, thoracic and abdominal wall motion, anterior tibialis EMG, snore microphone, electrocardiogram, and pulse oximetry.  MEDICATIONS amLODipine (NORVASC) 10 MG tablet ascorbic acid (VITAMIN C) 500 MG tablet aspirin EC 81 MG tablet Blood Glucose Monitoring Suppl (ONE TOUCH ULTRA 2) w/Device KIT citalopram (CELEXA) 40 MG tablet dexamethasone (DECADRON) 2 MG tablet diclofenac Sodium (VOLTAREN) 1 % GEL docusate sodium (COLACE) 100 MG capsule doxycycline (VIBRA-TABS) 100 MG tablet fenofibrate (TRICOR) 145 MG tablet fluconazole (DIFLUCAN) 100 MG tablet fluticasone (FLONASE) 50 MCG/ACT nasal spray furosemide (LASIX) 40 MG tablet gabapentin (NEURONTIN) 300 MG capsule glucose blood test strip hydrocortisone (ANUSOL-HC) 25 MG suppository Lancets MISC lidocaine (LIDODERM) 5 % lisinopril (ZESTRIL) 40 MG tablet loratadine (CLARITIN) 10 MG tablet magnesium oxide (MAG-OX) 400 MG tablet metFORMIN (GLUCOPHAGE-XR) 500 MG 24 hr tablet methocarbamol (ROBAXIN) 500 MG tablet Multiple Minerals-Vitamins (CALCIUM-MAGNESIUM-ZINC-D3) TABS oxyCODONE-acetaminophen (PERCOCET) 10-325 MG  tablet Potassium Chloride ER 20 MEQ TBCR rosuvastatin (CRESTOR) 20 MG tablet sildenafil (VIAGRA) 100 MG tablet tolterodine (DETROL LA) 4 MG 24 hr capsule vitamin B-12 (CYANOCOBALAMIN) 1000 MCG tablet zinc gluconate 50 MG tablet Medications self-administered by patient taken the night of the study : GABAPENTIN, IBUPROFEN  SLEEP ARCHITECTURE The study was initiated at 10:45:56 PM and ended at 5:01:53 AM.  Sleep onset time was 72.7 minutes and the sleep efficiency was 43.6%%. The total sleep time was 164 minutes.  Stage REM latency was N/A minutes.  The patient spent 33.8%% of the night in stage N1 sleep, 66.2%% in stage N2 sleep, 0.0%% in stage N3 and 0% in REM.  Alpha intrusion was absent.  Supine sleep was 0.00%.  RESPIRATORY PARAMETERS The overall apnea/hypopnea index (AHI) was 120.4 per hour. There were 228 total apneas, including 207 obstructive, 2 central and 19 mixed apneas. There were 101 hypopneas and 0 RERAs.  The AHI during Stage REM sleep was N/A per hour.  AHI while supine was N/A per hour.  The mean oxygen saturation was 89.0%. The minimum SpO2 during sleep was 75.0%.  Loud snoring was noted during this study.  CARDIAC DATA The 2 lead EKG demonstrated sinus rhythm. The mean heart rate was 52.9 beats per minute. Other EKG findings include: None.  LEG MOVEMENT DATA The total PLMS were 0 with a resulting PLMS index of 0.0. Associated arousal with leg movement index was 0.0 .  IMPRESSIONS - Severe obstructive sleep apnea occurred during this study (AHI 120.4/h; RDI 120.4/h); REM sleep was absent. - No significant central sleep apnea occurred during this study (CAI 0.7/h). - Severe oxygen desaturation to a nadir of 75.0%. - The patient snored with loud snoring volume. - Abnormal sleep architecture with absent slow wave and REM sleep. - Reduced sleep efficiency at 43.6% and prolonged wake after sleep onset (WASO) at 139.2 minutes.  - No cardiac abnormalities were  noted  during this study. - Clinically significant periodic limb movements did not occur during sleep. No significant associated arousals.  DIAGNOSIS - Severe Obstructive Sleep Apnea (G47.33) - Nocturnal Hypoxemia (G47.36)  RECOMMENDATIONS - Expeditious in-lab therapeutic CPAP/BiPAP titration to determine optimal pressure required to alleviate sleep disordered breathing. - Effort should be made to optimize nasal and oropharyngeal patency. - Avoid alcohol, sedatives and other CNS depressants that may worsen sleep apnea and disrupt normal sleep architecture. - Sleep hygiene should be reviewed to assess factors that may improve sleep quality. - Weight management (BMI 35) and regular exercise should be initiated or continued if appropriate.  [Electronically signed] 12/31/2020 02:49 PM  Shelva Majestic MD, [facility], [place], American Board of Sleep Medicine   NPI: [id number]  [place] PH: [phone]   FX: [phone] [place]

## 2021-01-01 ENCOUNTER — Other Ambulatory Visit: Payer: Self-pay | Admitting: Internal Medicine

## 2021-01-01 ENCOUNTER — Encounter: Payer: Self-pay | Admitting: Internal Medicine

## 2021-01-01 MED ORDER — LISINOPRIL 40 MG PO TABS: 40.0000 mg | ORAL_TABLET | Freq: Every day | ORAL | 2 refills | Status: DC

## 2021-01-01 NOTE — Telephone Encounter (Signed)
Pls advise if ok to increase dosage.Marland KitchenJohny [name]

## 2021-01-08 DIAGNOSIS — M170 Bilateral primary osteoarthritis of knee: Secondary | ICD-10-CM | POA: Diagnosis not present

## 2021-01-11 ENCOUNTER — Telehealth: Payer: Self-pay | Admitting: *Deleted

## 2021-01-11 ENCOUNTER — Encounter: Payer: Self-pay | Admitting: Internal Medicine

## 2021-01-11 MED ORDER — GUAIFENESIN-DM 100-10 MG/5ML PO SYRP: 5.0000 mL | ORAL_SOLUTION | Freq: Four times a day (QID) | ORAL | 1 refills | Status: DC | PRN

## 2021-01-11 MED ORDER — AZITHROMYCIN 250 MG PO TABS: ORAL_TABLET | ORAL | 0 refills | Status: AC

## 2021-01-11 NOTE — Telephone Encounter (Signed)
Prior Authorization for CPAP titration sent to [facility]) via web portal. Tracking Number [id number].

## 2021-01-11 NOTE — Telephone Encounter (Signed)
Patient notified of NSPG results and recommendations. He agrees to proceed with CPAP titration. The patient has no questions at this time about the report.

## 2021-01-12 ENCOUNTER — Other Ambulatory Visit: Payer: Self-pay | Admitting: Cardiovascular Disease

## 2021-01-12 DIAGNOSIS — G4736 Sleep related hypoventilation in conditions classified elsewhere: Secondary | ICD-10-CM

## 2021-01-12 DIAGNOSIS — I509 Heart failure, unspecified: Secondary | ICD-10-CM

## 2021-01-12 DIAGNOSIS — G4733 Obstructive sleep apnea (adult) (pediatric): Secondary | ICD-10-CM

## 2021-01-12 NOTE — Telephone Encounter (Signed)
[employer] Auth received for CPAP/BIPAP titration. Appointment has been scheduled on 01/25/21. Message left notifying the patient of the appointment. If he cannot make it then he is to call me back to let me know.

## 2021-01-14 ENCOUNTER — Encounter: Payer: Self-pay | Admitting: Internal Medicine

## 2021-01-15 NOTE — Telephone Encounter (Signed)
Message has been addressed.

## 2021-01-21 ENCOUNTER — Encounter: Payer: Self-pay | Admitting: Internal Medicine

## 2021-01-22 NOTE — Telephone Encounter (Signed)
[place] for [employer], I will be gone next wk, so any provider ok

## 2021-01-25 ENCOUNTER — Ambulatory Visit (HOSPITAL_BASED_OUTPATIENT_CLINIC_OR_DEPARTMENT_OTHER): Payer: Medicare HMO | Admitting: Cardiovascular Disease

## 2021-01-26 ENCOUNTER — Encounter: Payer: Self-pay | Admitting: Internal Medicine

## 2021-01-26 ENCOUNTER — Other Ambulatory Visit: Payer: Self-pay

## 2021-01-26 ENCOUNTER — Telehealth (INDEPENDENT_AMBULATORY_CARE_PROVIDER_SITE_OTHER): Payer: Medicare HMO | Admitting: Internal Medicine

## 2021-01-26 DIAGNOSIS — L293 Anogenital pruritus, unspecified: Secondary | ICD-10-CM | POA: Diagnosis not present

## 2021-01-26 DIAGNOSIS — E1140 Type 2 diabetes mellitus with diabetic neuropathy, unspecified: Secondary | ICD-10-CM | POA: Diagnosis not present

## 2021-01-26 MED ORDER — FLUCONAZOLE 150 MG PO TABS: 150.0000 mg | ORAL_TABLET | ORAL | 0 refills | Status: DC

## 2021-01-26 NOTE — Assessment & Plan Note (Signed)
Advised that having diabetes and taking antibiotic can predispose to yeast infection. He is not taking sglt-2 medication. Continue metformin.

## 2021-01-26 NOTE — Assessment & Plan Note (Signed)
Suspect yeast infection. He has diagnosis of same in the past and recent antibiotic exposure and is diabetic. Rx diflucan 150 mg take today and 150 mg to take Friday to assure clearance. No sexual partners or concern for STI per patient.

## 2021-01-26 NOTE — Progress Notes (Signed)
Virtual Visit via Video Note  I connected with Harold [name] on 01/26/21 at  1:40 PM EDT by a video enabled telemedicine application and verified that I am speaking with the correct person using two identifiers.  The patient and the provider were at separate locations throughout the entire encounter. Patient location: home, Provider location: work   I discussed the limitations of evaluation and management by telemedicine and the availability of in person appointments. The patient expressed understanding and agreed to proceed. The patient and the provider were the only parties present for the visit unless noted in HPI below.  History of Present Illness: The patient is a 55 y.o. man with visit for urinary burning and itching. Started after taking antibiotic. No current sexual partner and no concerns for STI. Has DM and past yeast infection feels similar. Denies lesion or sore genitally.  Observations/Objective: Appearance: normal, breathing appears normal, casual grooming,   Assessment and Plan: See problem oriented charting  Follow Up Instructions: rx diflucan to take 1 pill today and 1 pill Friday  I discussed the assessment and treatment plan with the patient. The patient was provided an opportunity to ask questions and all were answered. The patient agreed with the plan and demonstrated an understanding of the instructions.   The patient was advised to call back or seek an in-person evaluation if the symptoms worsen or if the condition fails to improve as anticipated.  Hoyt Koch, MD

## 2021-01-28 ENCOUNTER — Other Ambulatory Visit: Payer: Self-pay

## 2021-01-28 ENCOUNTER — Ambulatory Visit (HOSPITAL_BASED_OUTPATIENT_CLINIC_OR_DEPARTMENT_OTHER): Payer: Medicare HMO | Attending: Cardiovascular Disease | Admitting: Cardiovascular Disease

## 2021-01-28 DIAGNOSIS — I509 Heart failure, unspecified: Secondary | ICD-10-CM | POA: Insufficient documentation

## 2021-01-28 DIAGNOSIS — G4733 Obstructive sleep apnea (adult) (pediatric): Secondary | ICD-10-CM | POA: Diagnosis not present

## 2021-01-28 DIAGNOSIS — Z79899 Other long term (current) drug therapy: Secondary | ICD-10-CM | POA: Diagnosis not present

## 2021-01-28 DIAGNOSIS — Z791 Long term (current) use of non-steroidal anti-inflammatories (NSAID): Secondary | ICD-10-CM | POA: Diagnosis not present

## 2021-01-28 DIAGNOSIS — G4736 Sleep related hypoventilation in conditions classified elsewhere: Secondary | ICD-10-CM | POA: Diagnosis not present

## 2021-01-29 DIAGNOSIS — M1711 Unilateral primary osteoarthritis, right knee: Secondary | ICD-10-CM | POA: Diagnosis not present

## 2021-02-12 ENCOUNTER — Encounter (HOSPITAL_BASED_OUTPATIENT_CLINIC_OR_DEPARTMENT_OTHER): Payer: Self-pay | Admitting: Cardiovascular Disease

## 2021-02-12 NOTE — Procedures (Signed)
Patient Name: Harold [name], Harold [name] Date: 01/28/2021 Gender: Male D.O.B: [birth year] Age (years): 55 Referring Provider: Shelva Majestic MD, ABSM Height (inches): 71 Interpreting Physician: Shelva Majestic MD, ABSM Weight (lbs): 252 RPSGT: Jorge Ny BMI: 35 MRN: [id number] Neck Size: 19.50  CLINICAL INFORMATION The patient is referred for a BiPAP titration to treat sleep apnea.  Date of NPSG:  12/21/2020:  AHI 120.4/h without REM or supine sleep; O2 nadir 75%.  SLEEP STUDY TECHNIQUE As per the AASM Manual for the Scoring of Sleep and Associated Events v2.3 (April 2016) with a hypopnea requiring 4% desaturations.  The channels recorded and monitored were frontal, central and occipital EEG, electrooculogram (EOG), submentalis EMG (chin), nasal and oral airflow, thoracic and abdominal wall motion, anterior tibialis EMG, snore microphone, electrocardiogram, and pulse oximetry. Bilevel positive airway pressure (BPAP) was initiated at the beginning of the study and titrated to treat sleep-disordered breathing.  MEDICATIONS amLODipine (NORVASC) 10 MG tablet ascorbic acid (VITAMIN C) 500 MG tablet aspirin EC 81 MG tablet Blood Glucose Monitoring Suppl (ONE TOUCH ULTRA 2) w/Device KIT citalopram (CELEXA) 40 MG tablet diclofenac Sodium (VOLTAREN) 1 % GEL docusate sodium (COLACE) 100 MG capsule fenofibrate (TRICOR) 145 MG tablet fluconazole (DIFLUCAN) 150 MG tablet fluticasone (FLONASE) 50 MCG/ACT nasal spray furosemide (LASIX) 40 MG tablet gabapentin (NEURONTIN) 300 MG capsule glucose blood test strip guaiFENesin-dextromethorphan (ROBITUSSIN DM) 100-10 MG/5ML syrup hydrocortisone (ANUSOL-HC) 25 MG suppository Lancets MISC lidocaine (LIDODERM) 5 % lisinopril (ZESTRIL) 40 MG tablet loratadine (CLARITIN) 10 MG tablet magnesium oxide (MAG-OX) 400 MG tablet metFORMIN (GLUCOPHAGE-XR) 500 MG 24 hr tablet methocarbamol (ROBAXIN) 500 MG tablet Multiple Minerals-Vitamins  (CALCIUM-MAGNESIUM-ZINC-D3) TABS oxyCODONE-acetaminophen (PERCOCET) 10-325 MG tablet Potassium Chloride ER 20 MEQ TBCR rosuvastatin (CRESTOR) 20 MG tablet sildenafil (VIAGRA) 100 MG tablet tolterodine (DETROL LA) 4 MG 24 hr capsule vitamin B-12 (CYANOCOBALAMIN) 1000 MCG tablet zinc gluconate 50 MG tablet  Medications self-administered by patient taken the night of the study : GABAPENTIN, IBUPROFEN, ACETAMINOPHEN W/OXYCODONE  RESPIRATORY PARAMETERS Optimal IPAP Pressure (cm): 24 AHI at Optimal Pressure (/hr) 0.6 Optimal EPAP Pressure (cm): 19   Overall Minimal O2 (%): 77.0 Minimal O2 at Optimal Pressure (%): 91.0  SLEEP ARCHITECTURE Start Time: 10:13:31 PM Stop Time: 4:52:30 AM Total Time (min): 399 Total Sleep Time (min): 360.5 Sleep Latency (min): 14.7 Sleep Efficiency (%): 90.4% REM Latency (min): 169.0 WASO (min): 23.8 Stage N1 (%): 4.2% Stage N2 (%): 75.6% Stage N3 (%): 0.0% Stage R (%): 20.3 Supine (%): 72.68 Arousal Index (/hr): 17.1   CARDIAC DATA The 2 lead EKG demonstrated sinus rhythm. The mean heart rate was 45.5 beats per minute. Other EKG findings include: None.  LEG MOVEMENT DATA The total Periodic Limb Movements of Sleep (PLMS) were 0. The PLMS index was 0.0. A PLMS index of <15 is considered normal in adults.  IMPRESSIONS - CPAP was initiated at 6 cm, titrated to 16 cm and transitioned to BiPAP S/T.  BiPAP S/T with backup rate of 14 was titrated to optimal pressure at 24/19 cm of water; AHI 0.6/h; O2 nadir 91%.  - Mild Central Sleep Apnea was noted during this titration (CAI  6.5/h) which resolved with increased BiPAP S/T. - Severe oxygen desaturations were observed during this titration to a nadir of 77.0% at 14 cm of water. - The patient snored with moderate snoring volume. - No cardiac abnormalities were observed during this study. - Clinically significant periodic limb movements were not noted during this study. Arousals associated with PLMs were  rare.  DIAGNOSIS - Obstructive Sleep Apnea (G47.33)  RECOMMENDATIONS - Recommend a trial of BiPAP S/T Auto therapy with EPAP min of 17, pressure support of 5, and IPAP max of 25 cm H2O with backup rate of 14 and heated humidification. A  Small size Resmed Full Face Mask AirFit F20 mask was used for the titration. - Effort should be made to optimize nasal nad oropharyngeal patency.  - Avoid alcohol, sedatives and other CNS depressants that may worsen sleep apnea and disrupt normal sleep architecture. - Sleep hygiene should be reviewed to assess factors that may improve sleep quality. - Weight management and regular exercise should be initiated or continued. - Recommend a download in 30 days and sleep clinic evaluation after 4 weeks of therapy.  [Electronically signed] 02/12/2021 10:06 AM  Shelva Majestic MD, [facility], [place], American Board of Sleep Medicine   NPI: [id number]  [place] PH: [phone]   FX: [phone] [place]

## 2021-02-17 ENCOUNTER — Telehealth: Payer: Self-pay | Admitting: *Deleted

## 2021-02-17 NOTE — Telephone Encounter (Signed)
-----   Message from Troy Sine, MD sent at 02/12/2021 10:12 AM EST ----- Mariann Laster, please notify pt and set up with DME for BiPAP S/T

## 2021-02-17 NOTE — Telephone Encounter (Signed)
BIPAP order faxed to [facility].

## 2021-02-22 ENCOUNTER — Telehealth: Payer: Self-pay | Admitting: Cardiology

## 2021-02-22 NOTE — Telephone Encounter (Signed)
Pt would like to know when he will be receiving his CPAP machine.

## 2021-02-23 ENCOUNTER — Other Ambulatory Visit: Payer: Self-pay | Admitting: Internal Medicine

## 2021-02-23 DIAGNOSIS — G959 Disease of spinal cord, unspecified: Secondary | ICD-10-CM | POA: Diagnosis not present

## 2021-02-23 DIAGNOSIS — M48062 Spinal stenosis, lumbar region with neurogenic claudication: Secondary | ICD-10-CM | POA: Diagnosis not present

## 2021-02-23 DIAGNOSIS — M542 Cervicalgia: Secondary | ICD-10-CM | POA: Diagnosis not present

## 2021-02-24 ENCOUNTER — Other Ambulatory Visit: Payer: Self-pay | Admitting: Neurosurgery

## 2021-02-24 DIAGNOSIS — M48062 Spinal stenosis, lumbar region with neurogenic claudication: Secondary | ICD-10-CM

## 2021-03-01 NOTE — Telephone Encounter (Signed)
Left message per Ivin Booty @ [facility] she is waiting on approval from insurance company. Once this has been received and a machine becomes available he will be contacted. Contact information for [facility] left for the patient.

## 2021-03-02 DIAGNOSIS — M25561 Pain in right knee: Secondary | ICD-10-CM | POA: Diagnosis not present

## 2021-03-02 DIAGNOSIS — M48062 Spinal stenosis, lumbar region with neurogenic claudication: Secondary | ICD-10-CM | POA: Diagnosis not present

## 2021-03-03 ENCOUNTER — Encounter: Payer: Self-pay | Admitting: Internal Medicine

## 2021-03-08 ENCOUNTER — Encounter: Payer: Self-pay | Admitting: Internal Medicine

## 2021-03-08 ENCOUNTER — Other Ambulatory Visit: Payer: Self-pay

## 2021-03-08 ENCOUNTER — Ambulatory Visit (INDEPENDENT_AMBULATORY_CARE_PROVIDER_SITE_OTHER): Payer: Medicare HMO | Admitting: Internal Medicine

## 2021-03-08 VITALS — BP 146/90 | HR 58 | Temp 98.5°F | Ht 71.0 in | Wt 258.0 lb

## 2021-03-08 DIAGNOSIS — J019 Acute sinusitis, unspecified: Secondary | ICD-10-CM | POA: Diagnosis not present

## 2021-03-08 DIAGNOSIS — R300 Dysuria: Secondary | ICD-10-CM

## 2021-03-08 DIAGNOSIS — K5900 Constipation, unspecified: Secondary | ICD-10-CM

## 2021-03-08 DIAGNOSIS — Z1159 Encounter for screening for other viral diseases: Secondary | ICD-10-CM

## 2021-03-08 DIAGNOSIS — E1140 Type 2 diabetes mellitus with diabetic neuropathy, unspecified: Secondary | ICD-10-CM

## 2021-03-08 DIAGNOSIS — N3281 Overactive bladder: Secondary | ICD-10-CM

## 2021-03-08 DIAGNOSIS — Z0001 Encounter for general adult medical examination with abnormal findings: Secondary | ICD-10-CM

## 2021-03-08 DIAGNOSIS — I10 Essential (primary) hypertension: Secondary | ICD-10-CM | POA: Diagnosis not present

## 2021-03-08 DIAGNOSIS — J309 Allergic rhinitis, unspecified: Secondary | ICD-10-CM | POA: Diagnosis not present

## 2021-03-08 DIAGNOSIS — E538 Deficiency of other specified B group vitamins: Secondary | ICD-10-CM | POA: Diagnosis not present

## 2021-03-08 DIAGNOSIS — E559 Vitamin D deficiency, unspecified: Secondary | ICD-10-CM

## 2021-03-08 LAB — CBC WITH DIFFERENTIAL/PLATELET
Basophils Absolute: 0 10*3/uL (ref 0.0–0.1)
Basophils Relative: 0.2 % (ref 0.0–3.0)
Eosinophils Absolute: 0.2 10*3/uL (ref 0.0–0.7)
Eosinophils Relative: 2 % (ref 0.0–5.0)
HCT: 43.2 % (ref 39.0–52.0)
Hemoglobin: 14.2 g/dL (ref 13.0–17.0)
Lymphocytes Relative: 23.7 % (ref 12.0–46.0)
Lymphs Abs: 2.7 10*3/uL (ref 0.7–4.0)
MCHC: 32.9 g/dL (ref 30.0–36.0)
MCV: 94.5 fl (ref 78.0–100.0)
Monocytes Absolute: 1.1 10*3/uL — ABNORMAL HIGH (ref 0.1–1.0)
Monocytes Relative: 9.4 % (ref 3.0–12.0)
Neutro Abs: 7.5 10*3/uL (ref 1.4–7.7)
Neutrophils Relative %: 64.7 % (ref 43.0–77.0)
Platelets: 419 10*3/uL — ABNORMAL HIGH (ref 150.0–400.0)
RBC: 4.57 Mil/uL (ref 4.22–5.81)
RDW: 14.3 % (ref 11.5–15.5)
WBC: 11.5 10*3/uL — ABNORMAL HIGH (ref 4.0–10.5)

## 2021-03-08 LAB — URINALYSIS, ROUTINE W REFLEX MICROSCOPIC
Bilirubin Urine: NEGATIVE
Hgb urine dipstick: NEGATIVE
Ketones, ur: NEGATIVE
Nitrite: POSITIVE — AB
RBC / HPF: NONE SEEN (ref 0–?)
Specific Gravity, Urine: 1.015 (ref 1.000–1.030)
Total Protein, Urine: NEGATIVE
Urine Glucose: NEGATIVE
Urobilinogen, UA: 1 (ref 0.0–1.0)
pH: 6.5 (ref 5.0–8.0)

## 2021-03-08 LAB — HEPATIC FUNCTION PANEL
ALT: 13 U/L (ref 0–53)
AST: 15 U/L (ref 0–37)
Albumin: 4.5 g/dL (ref 3.5–5.2)
Alkaline Phosphatase: 53 U/L (ref 39–117)
Bilirubin, Direct: 0.1 mg/dL (ref 0.0–0.3)
Total Bilirubin: 0.3 mg/dL (ref 0.2–1.2)
Total Protein: 6.8 g/dL (ref 6.0–8.3)

## 2021-03-08 LAB — BASIC METABOLIC PANEL
BUN: 14 mg/dL (ref 6–23)
CO2: 28 mEq/L (ref 19–32)
Calcium: 10.7 mg/dL — ABNORMAL HIGH (ref 8.4–10.5)
Chloride: 101 mEq/L (ref 96–112)
Creatinine, Ser: 1.01 mg/dL (ref 0.40–1.50)
GFR: 84.84 mL/min (ref >60.00)
Glucose, Bld: 99 mg/dL (ref 70–99)
Potassium: 3.9 mEq/L (ref 3.5–5.1)
Sodium: 141 mEq/L (ref 135–145)

## 2021-03-08 LAB — LIPID PANEL
Cholesterol: 180 mg/dL (ref 0–200)
HDL: 23.5 mg/dL — ABNORMAL LOW (ref >39.00)
NonHDL: 156
Total CHOL/HDL Ratio: 8
Triglycerides: 338 mg/dL — ABNORMAL HIGH (ref 0.0–149.0)
VLDL: 67.6 mg/dL — ABNORMAL HIGH (ref 0.0–40.0)

## 2021-03-08 LAB — MICROALBUMIN / CREATININE URINE RATIO
Creatinine,U: 118.3 mg/dL
Microalb Creat Ratio: 1.6 mg/g (ref 0.0–30.0)
Microalb, Ur: 1.9 mg/dL (ref 0.0–1.9)

## 2021-03-08 LAB — PSA: PSA: 2.45 ng/mL (ref 0.10–4.00)

## 2021-03-08 LAB — TSH: TSH: 0.6 u[IU]/mL (ref 0.35–5.50)

## 2021-03-08 LAB — LDL CHOLESTEROL, DIRECT: Direct LDL: 121 mg/dL

## 2021-03-08 LAB — HEMOGLOBIN A1C: Hgb A1c MFr Bld: 6.5 % (ref 4.6–6.5)

## 2021-03-08 LAB — VITAMIN B12: Vitamin B-12: 567 pg/mL (ref 211–911)

## 2021-03-08 LAB — VITAMIN D 25 HYDROXY (VIT D DEFICIENCY, FRACTURES): VITD: 41.82 ng/mL (ref 30.00–100.00)

## 2021-03-08 MED ORDER — HYDROCHLOROTHIAZIDE 12.5 MG PO CAPS: 12.5000 mg | ORAL_CAPSULE | Freq: Every day | ORAL | 3 refills | Status: DC

## 2021-03-08 MED ORDER — PREDNISONE 10 MG PO TABS: ORAL_TABLET | ORAL | 0 refills | Status: DC

## 2021-03-08 MED ORDER — METHYLPREDNISOLONE ACETATE 80 MG/ML IJ SUSP
80.0000 mg | Freq: Once | INTRAMUSCULAR | Status: AC
  Administered 2021-03-08: 80 mg via INTRAMUSCULAR

## 2021-03-08 MED ORDER — CEPHALEXIN 500 MG PO CAPS: 500.0000 mg | ORAL_CAPSULE | Freq: Four times a day (QID) | ORAL | 0 refills | Status: DC

## 2021-03-08 NOTE — Patient Instructions (Signed)
You had the steroid shot today  Please take all new medication as prescribed - the antibiotic, and prednisone  Please continue all other medications as before, and refills have been done if requested.  Please have the pharmacy call with any other refills you may need.  Please continue your efforts at being more active, low cholesterol diet, and weight control.  You are otherwise up to date with prevention measures today.  Please keep your appointments with your specialists as you may have planned  Please go to the LAB at the blood drawing area for the tests to be done  You will be contacted by phone if any changes need to be made immediately.  Otherwise, you will receive a letter about your results with an explanation, but please check with MyChart first.  Please remember to sign up for MyChart if you have not done so, as this will be important to you in the future with finding out test results, communicating by private email, and scheduling acute appointments online when needed.  Please make an Appointment to return in 6 months, or sooner if needed

## 2021-03-08 NOTE — Progress Notes (Signed)
Patient ID: Harold [name], male   DOB: March 14, 1966, 55 y.o.   MRN: [id number]         Chief Complaint:: wellness exam and Office Visit (Sinus pressure, elevated BP, discuss UA)         HPI:  Harold [name] is a 55 y.o. male here for wellness exam; declines optho referral and plans to f/u himself, declines covid booster, shingrix and flu shot, o/w up to date                        Also  Here with 2-3 days acute onset fever, facial pain, pressure, headache, general weakness and malaise, and greenish d/c, with mild ST and cough, but pt denies chest pain, wheezing, increased sob or doe, orthopnea, PND, increased LE swelling, palpitations, dizziness or syncope.  Also c/o urinary frequency with mild dysuria x 5 days,  but Denies urinary symptoms such as urgency, flank pain, hematuria or n/v, fever, chills.  Does have several wks ongoing nasal allergy symptoms with clearish congestion, itch and sneezing, without fever, pain, ST, cough, swelling or wheezing.  Also has worsening constipation recently, not taking any OTC, and despite diet change.  BP has been mild elevated at home for several months, lasix seems to cause worsening ED, and asks for less strong fluid pill   Pt denies polydipsia, polyuria, or new focal neuro s/s.  .   Wt Readings from Last 3 Encounters:  03/08/21 258 lb (117 kg)  01/28/21 235 lb (106.6 kg)  12/21/20 253 lb (114.8 kg)   BP Readings from Last 3 Encounters:  03/08/21 (!) 146/90  10/12/20 140/80  10/01/20 (!) 164/75   Immunization History  Administered Date(s) Administered   Hepatitis B 03/29/2003, 06/27/2003, 09/26/2003   Influenza Split 01/27/2011   Influenza,inj,Quad PF,6+ Mos 03/08/2013, 11/19/2013, 01/21/2015, 12/20/2016, 01/26/2018, 01/07/2019   PFIZER(Purple Top)SARS-COV-2 Vaccination 06/08/2019, 06/29/2019   PNEUMOCOCCAL CONJUGATE-20 10/12/2020   Pneumococcal Polysaccharide-23 03/24/2010, 03/04/2015   Td 03/28/2001   Tdap 09/14/2012, 10/05/2017   There are no  preventive care reminders to display for this patient.     Past Medical History:  Diagnosis Date   Acute idiopathic pericarditis 04/08/2010   Qualifier: Diagnosis of  By: [name], [name]     Acute sinusitis 06/04/2014   Anxiety    CHF (congestive heart failure) ([place]) 06/29/2010   was pericardititis not CHF   Chronic back pain    Constipation    DDD (degenerative disc disease), lumbar 09/14/2012   Depression    Erectile dysfunction 09/14/2012   GERD (gastroesophageal reflux disease)    ocassional   Gout, unspecified 05/01/2007   Qualifier: Diagnosis of  By: [name] MD, Dellis Filbert A    Headache    migraines in the past   History of pancreatitis    a. admx 04-2009.Marland KitchenMarland Kitchen? 2-2 triglycerides   Hypertension    Hypertriglyceridemia    a. followed by [facility]   Left sided sciatica 09/14/2012   Neuropathy    Obesity    OSA (obstructive sleep apnea)    CPAP- not current   Restless legs    Type 2 diabetes mellitus with diabetic neuropathy, unspecified ([place]) 09/20/2019   Past Surgical History:  Procedure Laterality Date   ANTERIOR CERVICAL DECOMP/DISCECTOMY FUSION N/A 09/14/2020   Procedure: Anterior Cervical Decompression Fusion - Cervical three-Cervical four;  Surgeon: Kary Kos, MD;  Location: [facility];  Service: Neurosurgery;  Laterality: N/A;   COLONOSCOPY     KNEE  SURGERY Right    x3 scopes   KNEE SURGERY Right    open menisicus repair   LUMBAR LAMINECTOMY/DECOMPRESSION MICRODISCECTOMY N/A 08/26/2015   Procedure: Lumbar Two-Sacral One  Laminectomy for decompression;  Surgeon: [name], MD;  Location: [facility];  Service: Neurosurgery;  Laterality: N/A;   pericardectomy  07/15/2010   Dr. [name]   pleurx catheter placement  07/15/2010   Dr. [name]   SHOULDER SURGERY     right and left shoulders- Rotator cuff repair   TONSILLECTOMY AND ADENOIDECTOMY     one tonsil    reports that he has been smoking cigarettes. He has a 11.88 pack-year smoking history. He has never  used smokeless tobacco. He reports current alcohol use. He reports that he does not use drugs. family history includes Colon cancer (age of onset: 17) in his father; Diabetes in his father, mother, and sister; Heart disease (age of onset: 13) in his father; Hypertension in an other family member; Liver cancer in his paternal uncle; Pancreatitis in his father and paternal uncle; Stomach cancer in his maternal grandfather. Allergies  Allergen Reactions   Other Itching, Swelling and Palpitations    Pecans: itching and swelling of the tongue    Diphenhydramine Itching, Palpitations and Other (See Comments)    "jittery" Pt states he can take Benadryl in an emergency situation   Pecan Nut (Diagnostic)    Colchicine Diarrhea   Lipitor [Atorvastatin] Other (See Comments)    Muscle cramps   Septra [Sulfamethoxazole-Trimethoprim] Itching   Current Outpatient Medications on File Prior to Visit  Medication Sig Dispense Refill   amLODipine (NORVASC) 10 MG tablet TAKE 1 TABLET(10 MG) BY MOUTH DAILY 30 tablet 0   ascorbic acid (VITAMIN C) 500 MG tablet Take 500 mg by mouth daily.     aspirin EC 81 MG tablet Take 81 mg by mouth daily.     Blood Glucose Monitoring Suppl (ONE TOUCH ULTRA 2) w/Device KIT Use as directed daily E11.9 1 each 0   citalopram (CELEXA) 40 MG tablet TAKE 1 TABLET(40 MG) BY MOUTH DAILY 90 tablet 1   diclofenac Sodium (VOLTAREN) 1 % GEL Apply 1 application topically 3 (three) times daily as needed (PAIN). 150 g 0   docusate sodium (COLACE) 100 MG capsule Take 1 capsule (100 mg total) by mouth 2 (two) times daily. Purchase over the counter 60 capsule 0   fenofibrate (TRICOR) 145 MG tablet TAKE 1 TABLET(145 MG) BY MOUTH DAILY 90 tablet 1   fluconazole (DIFLUCAN) 150 MG tablet Take 1 tablet (150 mg total) by mouth every 3 (three) days. 2 tablet 0   fluticasone (FLONASE) 50 MCG/ACT nasal spray Place 2 sprays into both nostrils daily. 16 g 6   gabapentin (NEURONTIN) 300 MG capsule Take 900  mg by mouth 3 (three) times daily.     glucose blood test strip Use as instructed once daily E119 100 each 12   guaiFENesin-dextromethorphan (ROBITUSSIN DM) 100-10 MG/5ML syrup Take 5 mLs by mouth every 6 (six) hours as needed for cough. 118 mL 1   hydrocortisone (ANUSOL-HC) 25 MG suppository Place 1 suppository (25 mg total) rectally every 12 (twelve) hours. 12 suppository 1   Lancets MISC Use as directed daily E11.9 100 each 11   lidocaine (LIDODERM) 5 % Place 1 patch onto the skin daily. Can apply to back on at 8 am and off at 8 pm --has to be off for 12 hours. Purchase over the counter. 30 patch 0  lisinopril (ZESTRIL) 40 MG tablet Take 1 tablet (40 mg total) by mouth daily. 90 tablet 2   loratadine (CLARITIN) 10 MG tablet Take 1 tablet (10 mg total) by mouth daily as needed for allergies. 90 tablet 3   magnesium oxide (MAG-OX) 400 MG tablet Take 400 mg by mouth daily.     metFORMIN (GLUCOPHAGE-XR) 500 MG 24 hr tablet TAKE 2 TABLETS BY MOUTH DAILY WITH BREAKFAST 180 tablet 3   methocarbamol (ROBAXIN) 500 MG tablet Take 1 tablet (500 mg total) by mouth every 6 (six) hours as needed for muscle spasms. 90 tablet 0   Multiple Minerals-Vitamins (CALCIUM-MAGNESIUM-ZINC-D3) TABS Take 1 tablet by mouth daily.     oxyCODONE-acetaminophen (PERCOCET) 10-325 MG tablet Take 1 tablet by mouth every 6 (six) hours as needed for pain. 30 tablet 0   Potassium Chloride ER 20 MEQ TBCR TAKE ONE TABLET BY MOUTH DAILY 90 tablet 2   rosuvastatin (CRESTOR) 20 MG tablet TAKE 1 TABLET(20 MG) BY MOUTH DAILY 30 tablet 0   sildenafil (VIAGRA) 100 MG tablet Take 0.5-1 tablets (50-100 mg total) by mouth daily as needed for erectile dysfunction. 5 tablet 11   tolterodine (DETROL LA) 4 MG 24 hr capsule Take 1 capsule (4 mg total) by mouth daily. 90 capsule 3   vitamin B-12 (CYANOCOBALAMIN) 1000 MCG tablet Take 1 tablet (1,000 mcg total) by mouth daily. 90 tablet 1   zinc gluconate 50 MG tablet Take 50 mg by mouth daily.      No current facility-administered medications on file prior to visit.        ROS:  All others reviewed and negative.  Objective        PE:  BP (!) 146/90 (BP Location: Right Arm, Patient Position: Sitting, Cuff Size: Large)   Pulse (!) 58   Temp 98.5 F (36.9 C) (Oral)   Ht '5\' 11"'  (1.803 m)   Wt 258 lb (117 kg)   SpO2 96%   BMI 35.98 kg/m                 Constitutional: Pt appears in NAD               HENT: Head: NCAT.                Right Ear: External ear normal.                 Left Ear: External ear normal. Bilat tm's with mild erythema.  Max sinus areas mild tender.  Pharynx with mild erythema, no exudate               Eyes: . Pupils are equal, round, and reactive to light. Conjunctivae and EOM are normal               Nose: without d/c or deformity               Neck: Neck supple. Gross normal ROM               Cardiovascular: Normal rate and regular rhythm.                 Pulmonary/Chest: Effort normal and breath sounds without rales or wheezing.                Abd:  Soft, NT, ND, + BS, no organomegaly               Neurological: Pt is alert. At baseline orientation, motor grossly intact  Skin: Skin is warm. No rashes, no other new lesions, LE edema - none               Psychiatric: Pt behavior is normal without agitation   Micro: none  Cardiac tracings I have personally interpreted today:  none  Pertinent Radiological findings (summarize): none   Lab Results  Component Value Date   WBC 11.5 (H) 03/08/2021   HGB 14.2 03/08/2021   HCT 43.2 03/08/2021   PLT 419.0 (H) 03/08/2021   GLUCOSE 99 03/08/2021   CHOL 180 03/08/2021   TRIG 338.0 (H) 03/08/2021   HDL 23.50 (L) 03/08/2021   LDLDIRECT 121.0 03/08/2021   LDLCALC 85 08/07/2019   ALT 13 03/08/2021   AST 15 03/08/2021   NA 141 03/08/2021   K 3.9 03/08/2021   CL 101 03/08/2021   CREATININE 1.01 03/08/2021   BUN 14 03/08/2021   CO2 28 03/08/2021   TSH 0.60 03/08/2021   PSA 2.45 03/08/2021    INR 1.72 (H) 07/05/2010   HGBA1C 6.5 03/08/2021   MICROALBUR 1.9 03/08/2021   Assessment/Plan:  Harold [name] is a 55 y.o. Black or African American [2] male with  has a past medical history of Acute idiopathic pericarditis (04/08/2010), Acute sinusitis (06/04/2014), Anxiety, CHF (congestive heart failure) ([place]) (06/29/2010), Chronic back pain, Constipation, DDD (degenerative disc disease), lumbar (09/14/2012), Depression, Erectile dysfunction (09/14/2012), GERD (gastroesophageal reflux disease), Gout, unspecified (05/01/2007), Headache, History of pancreatitis, Hypertension, Hypertriglyceridemia, Left sided sciatica (09/14/2012), Neuropathy, Obesity, OSA (obstructive sleep apnea), Restless legs, and Type 2 diabetes mellitus with diabetic neuropathy, unspecified ([place]) (09/20/2019).  Encounter for well adult exam with abnormal findings Age and sex appropriate education and counseling updated with regular exercise and diet Referrals for preventative services - pt plans to f/u with optho yearly Immunizations addressed - declines shingrix, covid booster and flu shot Smoking counseling  - pt counseled to quit, pt not ready Evidence for depression or other mood disorder - none significant Most recent labs reviewed. I have personally reviewed and have noted: 1) the patient's medical and social history 2) The patient's current medications and supplements 3) The patient's height, weight, and BMI have been recorded in the chart   Essential hypertension BP Readings from Last 3 Encounters:  03/08/21 (!) 146/90  10/12/20 140/80  10/01/20 (!) 164/75   Mild uncontrolled, for add HCT 12.5 qd, pt to continue medical treatment norvasc lisinopril   Acute sinusitis Mild to mod, for antibx course,  to f/u any worsening symptoms or concerns  Type 2 diabetes mellitus ([place]) Lab Results  Component Value Date   HGBA1C 6.5 03/08/2021   Stable, pt to continue current medical treatment metformin  Allergic  rhinitis Mild to mod uncontrolled, for depomedrol im 80, predpac asd add otc nasacort asd,  to f/u any worsening symptoms or concerns  OAB (overactive bladder) With recent acute on chronic frequency, exam ow benign, cant r/o uti - for urine culture as well  Constipation With recent worsening, for miralax daily,  to f/u any worsening symptoms or concerns  Followup: No follow-ups on file.  Cathlean Cower, MD 03/11/2021 4:52 AM [facility]

## 2021-03-09 LAB — HEPATITIS C ANTIBODY
Hepatitis C Ab: NONREACTIVE
SIGNAL TO CUT-OFF: <0.02 (ref <1.00)

## 2021-03-11 DIAGNOSIS — K5900 Constipation, unspecified: Secondary | ICD-10-CM | POA: Insufficient documentation

## 2021-03-11 NOTE — Assessment & Plan Note (Signed)
With recent worsening, for miralax daily,  to f/u any worsening symptoms or concerns

## 2021-03-11 NOTE — Assessment & Plan Note (Signed)
BP Readings from Last 3 Encounters:  03/08/21 (!) 146/90  10/12/20 140/80  10/01/20 (!) 164/75   Mild uncontrolled, for add HCT 12.5 qd, pt to continue medical treatment norvasc lisinopril

## 2021-03-11 NOTE — Assessment & Plan Note (Signed)
Mild to mod, for antibx course,  to f/u any worsening symptoms or concerns 

## 2021-03-11 NOTE — Assessment & Plan Note (Signed)
With recent acute on chronic frequency, exam ow benign, cant r/o uti - for urine culture as well

## 2021-03-11 NOTE — Assessment & Plan Note (Addendum)
Mild to mod uncontrolled, for depomedrol im 80, predpac asd add otc nasacort asd,  to f/u any worsening symptoms or concerns

## 2021-03-11 NOTE — Assessment & Plan Note (Signed)
Lab Results  Component Value Date   HGBA1C 6.5 03/08/2021   Stable, pt to continue current medical treatment metformin

## 2021-03-11 NOTE — Assessment & Plan Note (Signed)
Age and sex appropriate education and counseling updated with regular exercise and diet Referrals for preventative services - pt plans to f/u with optho yearly Immunizations addressed - declines shingrix, covid booster and flu shot Smoking counseling  - pt counseled to quit, pt not ready Evidence for depression or other mood disorder - none significant Most recent labs reviewed. I have personally reviewed and have noted: 1) the patient's medical and social history 2) The patient's current medications and supplements 3) The patient's height, weight, and BMI have been recorded in the chart

## 2021-03-28 HISTORY — PX: SHOULDER SURGERY: SHX246

## 2021-03-30 ENCOUNTER — Other Ambulatory Visit: Payer: Medicare HMO

## 2021-03-31 DIAGNOSIS — M1711 Unilateral primary osteoarthritis, right knee: Secondary | ICD-10-CM | POA: Diagnosis not present

## 2021-04-01 ENCOUNTER — Telehealth: Payer: Self-pay | Admitting: *Deleted

## 2021-04-01 NOTE — Telephone Encounter (Signed)
Received a call from Harold [name], RT with [facility] advising me that she has been trying to schedule the patient for his BIPAP set up. He has given her multiple reasons why he needed to wait to come in. Appointment was eventually scheduled and the patient was a "No Show." Harold [name] will attempt to contact the patient once more. If no success she will move on to the next patient needing a device. (Due to the back order.)

## 2021-04-07 ENCOUNTER — Ambulatory Visit
Admission: RE | Admit: 2021-04-07 | Discharge: 2021-04-07 | Disposition: A | Discharge status: Home / Self Care | Payer: Medicare HMO | Source: Ambulatory Visit | Attending: Neurosurgery | Admitting: Neurosurgery

## 2021-04-07 DIAGNOSIS — M48062 Spinal stenosis, lumbar region with neurogenic claudication: Secondary | ICD-10-CM

## 2021-04-07 DIAGNOSIS — M5136 Other intervertebral disc degeneration, lumbar region: Secondary | ICD-10-CM | POA: Diagnosis not present

## 2021-04-07 DIAGNOSIS — Z9889 Other specified postprocedural states: Secondary | ICD-10-CM | POA: Diagnosis not present

## 2021-04-07 DIAGNOSIS — M48061 Spinal stenosis, lumbar region without neurogenic claudication: Secondary | ICD-10-CM | POA: Diagnosis not present

## 2021-04-07 DIAGNOSIS — R531 Weakness: Secondary | ICD-10-CM | POA: Diagnosis not present

## 2021-04-07 MED ORDER — GADOBENATE DIMEGLUMINE 529 MG/ML IV SOLN
20.0000 mL | Freq: Once | INTRAVENOUS | Status: AC | PRN
  Administered 2021-04-07: 20 mL via INTRAVENOUS

## 2021-04-22 ENCOUNTER — Other Ambulatory Visit: Payer: Self-pay | Admitting: Internal Medicine

## 2021-04-22 ENCOUNTER — Encounter: Payer: Self-pay | Admitting: Internal Medicine

## 2021-04-27 DIAGNOSIS — M5126 Other intervertebral disc displacement, lumbar region: Secondary | ICD-10-CM | POA: Diagnosis not present

## 2021-04-27 DIAGNOSIS — M47817 Spondylosis without myelopathy or radiculopathy, lumbosacral region: Secondary | ICD-10-CM | POA: Diagnosis not present

## 2021-04-27 DIAGNOSIS — I10 Essential (primary) hypertension: Secondary | ICD-10-CM | POA: Diagnosis not present

## 2021-04-27 DIAGNOSIS — Z6836 Body mass index (BMI) 36.0-36.9, adult: Secondary | ICD-10-CM | POA: Diagnosis not present

## 2021-04-28 ENCOUNTER — Ambulatory Visit (INDEPENDENT_AMBULATORY_CARE_PROVIDER_SITE_OTHER): Payer: Medicare HMO | Admitting: Internal Medicine

## 2021-04-28 ENCOUNTER — Other Ambulatory Visit: Payer: Self-pay

## 2021-04-28 ENCOUNTER — Encounter: Payer: Self-pay | Admitting: Internal Medicine

## 2021-04-28 VITALS — BP 142/88 | HR 62 | Temp 98.7°F | Ht 71.0 in | Wt 258.2 lb

## 2021-04-28 DIAGNOSIS — E1169 Type 2 diabetes mellitus with other specified complication: Secondary | ICD-10-CM

## 2021-04-28 DIAGNOSIS — J309 Allergic rhinitis, unspecified: Secondary | ICD-10-CM | POA: Diagnosis not present

## 2021-04-28 DIAGNOSIS — M1711 Unilateral primary osteoarthritis, right knee: Secondary | ICD-10-CM

## 2021-04-28 DIAGNOSIS — J0141 Acute recurrent pansinusitis: Secondary | ICD-10-CM | POA: Diagnosis not present

## 2021-04-28 DIAGNOSIS — I10 Essential (primary) hypertension: Secondary | ICD-10-CM

## 2021-04-28 DIAGNOSIS — J329 Chronic sinusitis, unspecified: Secondary | ICD-10-CM | POA: Diagnosis not present

## 2021-04-28 MED ORDER — PREDNISONE 10 MG PO TABS: ORAL_TABLET | ORAL | 0 refills | Status: DC

## 2021-04-28 MED ORDER — KETOROLAC TROMETHAMINE 30 MG/ML IJ SOLN
30.0000 mg | Freq: Once | INTRAMUSCULAR | Status: AC
  Administered 2021-04-28: 30 mg via INTRAMUSCULAR

## 2021-04-28 MED ORDER — METHYLPREDNISOLONE ACETATE 80 MG/ML IJ SUSP
80.0000 mg | Freq: Once | INTRAMUSCULAR | Status: AC
  Administered 2021-04-28: 80 mg via INTRAMUSCULAR

## 2021-04-28 MED ORDER — LEVOFLOXACIN 500 MG PO TABS: 500.0000 mg | ORAL_TABLET | Freq: Every day | ORAL | 0 refills | Status: AC

## 2021-04-28 NOTE — Patient Instructions (Addendum)
You had the steroid and pain shots today (depomedrol and toradol)  Please take all new medication as prescribed - the antibiotic, prednisone  Please continue all other medications as before, including restart the claritin and flonase  Please have the pharmacy call with any other refills you may need.  Please keep your appointments with your specialists as you may have planned - orthopedic for your right knee, neck and lower back  You will be contacted regarding the referral for: CT sinus  Please make an Appointment to return in 6 months, or sooner if needed

## 2021-04-28 NOTE — Progress Notes (Signed)
Patient ID: Harold [name], male   DOB: Jan 27, 1966, 56 y.o.   MRN: [id number]        Chief Complaint: follow up right knee pain, htn, allergy, sinutisi       HPI:  Harold [name] is a 56 y.o. male  Here with 2-3 days acute onset fever, facial pain, pressure, headache, general weakness and malaise, and greenish d/c, with mild ST and cough, but pt denies chest pain, wheezing, increased sob or doe, orthopnea, PND, increased LE swelling, palpitations, dizziness or syncope. Does have several wks ongoing nasal allergy symptoms with clearish congestion, itch and sneezing, without fever, pain, ST, cough, swelling or wheezing.  Also has worening 1 wk acute on chronic right knee pain with mild swelling, no giveaways, has appt with ortho for gel shot next wk.  BP < 140/90 at home per pt.   Pt denies polydipsia, polyuria, or new focal neuro s/s.        Wt Readings from Last 3 Encounters:  04/28/21 258 lb 3.2 oz (117.1 kg)  03/08/21 258 lb (117 kg)  01/28/21 235 lb (106.6 kg)   BP Readings from Last 3 Encounters:  04/28/21 (!) 142/88  03/08/21 (!) 146/90  10/12/20 140/80         Past Medical History:  Diagnosis Date   Acute idiopathic pericarditis 04/08/2010   Qualifier: Diagnosis of  By: [name], [name]     Acute sinusitis 06/04/2014   Anxiety    CHF (congestive heart failure) ([place]) 06/29/2010   was pericardititis not CHF   Chronic back pain    Constipation    DDD (degenerative disc disease), lumbar 09/14/2012   Depression    Erectile dysfunction 09/14/2012   GERD (gastroesophageal reflux disease)    ocassional   Gout, unspecified 05/01/2007   Qualifier: Diagnosis of  By: [name] MD, Dellis Filbert A    Headache    migraines in the past   History of pancreatitis    a. admx 04-2009.Marland KitchenMarland Kitchen? 2-2 triglycerides   Hypertension    Hypertriglyceridemia    a. followed by [facility]   Left sided sciatica 09/14/2012   Neuropathy    Obesity    OSA (obstructive sleep apnea)    CPAP- not current   Restless legs     Type 2 diabetes mellitus with diabetic neuropathy, unspecified ([place]) 09/20/2019   Past Surgical History:  Procedure Laterality Date   ANTERIOR CERVICAL DECOMP/DISCECTOMY FUSION N/A 09/14/2020   Procedure: Anterior Cervical Decompression Fusion - Cervical three-Cervical four;  Surgeon: Kary Kos, MD;  Location: [place];  Service: Neurosurgery;  Laterality: N/A;   COLONOSCOPY     KNEE SURGERY Right    x3 scopes   KNEE SURGERY Right    open menisicus repair   LUMBAR LAMINECTOMY/DECOMPRESSION MICRODISCECTOMY N/A 08/26/2015   Procedure: Lumbar Two-Sacral One  Laminectomy for decompression;  Surgeon: [name], MD;  Location: [facility];  Service: Neurosurgery;  Laterality: N/A;   pericardectomy  07/15/2010   Dr. [name]   pleurx catheter placement  07/15/2010   Dr. [name]   SHOULDER SURGERY     right and left shoulders- Rotator cuff repair   TONSILLECTOMY AND ADENOIDECTOMY     one tonsil    reports that he has been smoking cigarettes. He has a 11.88 pack-year smoking history. He has never used smokeless tobacco. He reports current alcohol use. He reports that he does not use drugs. family history includes Colon cancer (age of onset: 77) in his father;  Diabetes in his father, mother, and sister; Heart disease (age of onset: 41) in his father; Hypertension in an other family member; Liver cancer in his paternal uncle; Pancreatitis in his father and paternal uncle; Stomach cancer in his maternal grandfather. Allergies  Allergen Reactions   Other Itching, Swelling and Palpitations    Pecans: itching and swelling of the tongue    Diphenhydramine Itching, Palpitations and Other (See Comments)    "jittery" Pt states he can take Benadryl in an emergency situation   Pecan Nut (Diagnostic)    Colchicine Diarrhea   Lipitor [Atorvastatin] Other (See Comments)    Muscle cramps   Septra [Sulfamethoxazole-Trimethoprim] Itching   Current Outpatient Medications on File Prior to Visit   Medication Sig Dispense Refill   amLODipine (NORVASC) 10 MG tablet TAKE 1 TABLET(10 MG) BY MOUTH DAILY 30 tablet 0   ascorbic acid (VITAMIN C) 500 MG tablet Take 500 mg by mouth daily.     aspirin EC 81 MG tablet Take 81 mg by mouth daily.     Blood Glucose Monitoring Suppl (ONE TOUCH ULTRA 2) w/Device KIT Use as directed daily E11.9 1 each 0   citalopram (CELEXA) 40 MG tablet TAKE 1 TABLET(40 MG) BY MOUTH DAILY 90 tablet 1   diclofenac Sodium (VOLTAREN) 1 % GEL Apply 1 application topically 3 (three) times daily as needed (PAIN). 150 g 0   docusate sodium (COLACE) 100 MG capsule Take 1 capsule (100 mg total) by mouth 2 (two) times daily. Purchase over the counter 60 capsule 0   fenofibrate (TRICOR) 145 MG tablet TAKE 1 TABLET(145 MG) BY MOUTH DAILY 90 tablet 1   fluconazole (DIFLUCAN) 150 MG tablet Take 1 tablet (150 mg total) by mouth every 3 (three) days. 2 tablet 0   fluticasone (FLONASE) 50 MCG/ACT nasal spray Place 2 sprays into both nostrils daily. 16 g 6   gabapentin (NEURONTIN) 300 MG capsule Take 900 mg by mouth 3 (three) times daily.     glucose blood test strip Use as instructed once daily E119 100 each 12   guaiFENesin-dextromethorphan (ROBITUSSIN DM) 100-10 MG/5ML syrup Take 5 mLs by mouth every 6 (six) hours as needed for cough. 118 mL 1   hydrochlorothiazide (MICROZIDE) 12.5 MG capsule Take 1 capsule (12.5 mg total) by mouth daily. 90 capsule 3   hydrocortisone (ANUSOL-HC) 25 MG suppository Place 1 suppository (25 mg total) rectally every 12 (twelve) hours. 12 suppository 1   Lancets MISC Use as directed daily E11.9 100 each 11   lidocaine (LIDODERM) 5 % Place 1 patch onto the skin daily. Can apply to back on at 8 am and off at 8 pm --has to be off for 12 hours. Purchase over the counter. 30 patch 0   lisinopril (ZESTRIL) 40 MG tablet Take 1 tablet (40 mg total) by mouth daily. 90 tablet 2   loratadine (CLARITIN) 10 MG tablet Take 1 tablet (10 mg total) by mouth daily as needed  for allergies. 90 tablet 3   magnesium oxide (MAG-OX) 400 MG tablet Take 400 mg by mouth daily.     metFORMIN (GLUCOPHAGE-XR) 500 MG 24 hr tablet TAKE 2 TABLETS BY MOUTH DAILY WITH BREAKFAST 180 tablet 3   methocarbamol (ROBAXIN) 500 MG tablet Take 1 tablet (500 mg total) by mouth every 6 (six) hours as needed for muscle spasms. 90 tablet 0   Multiple Minerals-Vitamins (CALCIUM-MAGNESIUM-ZINC-D3) TABS Take 1 tablet by mouth daily.     oxyCODONE-acetaminophen (PERCOCET) 10-325 MG tablet Take 1 tablet  by mouth every 6 (six) hours as needed for pain. 30 tablet 0   Potassium Chloride ER 20 MEQ TBCR TAKE ONE TABLET BY MOUTH DAILY 90 tablet 2   rosuvastatin (CRESTOR) 20 MG tablet TAKE 1 TABLET(20 MG) BY MOUTH DAILY 30 tablet 0   sildenafil (VIAGRA) 100 MG tablet Take 0.5-1 tablets (50-100 mg total) by mouth daily as needed for erectile dysfunction. 5 tablet 11   tolterodine (DETROL LA) 4 MG 24 hr capsule Take 1 capsule (4 mg total) by mouth daily. 90 capsule 3   vitamin B-12 (CYANOCOBALAMIN) 1000 MCG tablet Take 1 tablet (1,000 mcg total) by mouth daily. 90 tablet 1   zinc gluconate 50 MG tablet Take 50 mg by mouth daily.     No current facility-administered medications on file prior to visit.        ROS:  All others reviewed and negative.  Objective        PE:  BP (!) 142/88    Pulse 62    Temp 98.7 F (37.1 C) (Oral)    Ht '5\' 11"'  (1.803 m)    Wt 258 lb 3.2 oz (117.1 kg)    SpO2 98%    BMI 36.01 kg/m                 Constitutional: Pt appears in NAD               HENT: Head: NCAT.                Right Ear: External ear normal.                 Left Ear: External ear normal. Bilat tm's with mild erythema.  Max sinus areas mild tender.  Pharynx with mild erythema, no exudate               Eyes: . Pupils are equal, round, and reactive to light. Conjunctivae and EOM are normal               Nose: without d/c or deformity               Neck: Neck supple. Gross normal ROM                Cardiovascular: Normal rate and regular rhythm.                 Pulmonary/Chest: Effort normal and breath sounds without rales or wheezing.    Right knee with small effusion, reduced ROM, large crepitus               Abd:  Soft, NT, ND, + BS, no organomegaly               Neurological: Pt is alert. At baseline orientation, motor grossly intact               Skin: Skin is warm. No rashes, no other new lesions, LE edema - trace bilateral right > left               Psychiatric: Pt behavior is normal without agitation   Micro: none  Cardiac tracings I have personally interpreted today:  none  Pertinent Radiological findings (summarize): none   Lab Results  Component Value Date   WBC 11.5 (H) 03/08/2021   HGB 14.2 03/08/2021   HCT 43.2 03/08/2021   PLT 419.0 (H) 03/08/2021   GLUCOSE 99 03/08/2021   CHOL 180 03/08/2021   TRIG 338.0 (H) 03/08/2021  HDL 23.50 (L) 03/08/2021   LDLDIRECT 121.0 03/08/2021   LDLCALC 85 08/07/2019   ALT 13 03/08/2021   AST 15 03/08/2021   NA 141 03/08/2021   K 3.9 03/08/2021   CL 101 03/08/2021   CREATININE 1.01 03/08/2021   BUN 14 03/08/2021   CO2 28 03/08/2021   TSH 0.60 03/08/2021   PSA 2.45 03/08/2021   INR 1.72 (H) 07/05/2010   HGBA1C 6.5 03/08/2021   MICROALBUR 1.9 03/08/2021   Assessment/Plan:  Harold [name] is a 56 y.o. Black or African American [2] male with  has a past medical history of Acute idiopathic pericarditis (04/08/2010), Acute sinusitis (06/04/2014), Anxiety, CHF (congestive heart failure) ([place]) (06/29/2010), Chronic back pain, Constipation, DDD (degenerative disc disease), lumbar (09/14/2012), Depression, Erectile dysfunction (09/14/2012), GERD (gastroesophageal reflux disease), Gout, unspecified (05/01/2007), Headache, History of pancreatitis, Hypertension, Hypertriglyceridemia, Left sided sciatica (09/14/2012), Neuropathy, Obesity, OSA (obstructive sleep apnea), Restless legs, and Type 2 diabetes mellitus with diabetic neuropathy,  unspecified ([place]) (09/20/2019).  Acute sinusitis Mild to mod, for antibx course, CT sinus due to recurring frequent symptoms,  to f/u any worsening symptoms or concerns  Allergic rhinitis With mild seasonal flare - for depomedrom mim 80, predpac asd, restart claritin and flonase asd  Essential hypertension BP Readings from Last 3 Encounters:  04/28/21 (!) 142/88  03/08/21 (!) 146/90  10/12/20 140/80   Mild uncontrolled here, pt to continue medical treatment amlodipine, hct, lisinopril as declines change for now   Type 2 diabetes mellitus ([place]) Lab Results  Component Value Date   HGBA1C 6.5 03/08/2021   Stable, pt to continue current medical treatment metformin   Arthritis of right knee With limping today, no giveaways, has gel shot for next wk, ok for toradol 30 mg im now  Followup: No follow-ups on file.  Cathlean Cower, MD 05/02/2021 3:04 PM [facility]

## 2021-04-30 ENCOUNTER — Other Ambulatory Visit: Payer: Self-pay | Admitting: Neurosurgery

## 2021-04-30 DIAGNOSIS — M1711 Unilateral primary osteoarthritis, right knee: Secondary | ICD-10-CM | POA: Diagnosis not present

## 2021-04-30 DIAGNOSIS — M5126 Other intervertebral disc displacement, lumbar region: Secondary | ICD-10-CM

## 2021-05-02 DIAGNOSIS — M1711 Unilateral primary osteoarthritis, right knee: Secondary | ICD-10-CM | POA: Insufficient documentation

## 2021-05-02 NOTE — Assessment & Plan Note (Signed)
BP Readings from Last 3 Encounters:  04/28/21 (!) 142/88  03/08/21 (!) 146/90  10/12/20 140/80   Mild uncontrolled here, pt to continue medical treatment amlodipine, hct, lisinopril as declines change for now

## 2021-05-02 NOTE — Assessment & Plan Note (Signed)
With mild seasonal flare - for depomedrom mim 80, predpac asd, restart claritin and flonase asd

## 2021-05-02 NOTE — Assessment & Plan Note (Signed)
Lab Results  Component Value Date   HGBA1C 6.5 03/08/2021   Stable, pt to continue current medical treatment metformin

## 2021-05-02 NOTE — Assessment & Plan Note (Signed)
With limping today, no giveaways, has gel shot for next wk, ok for toradol 30 mg im now

## 2021-05-02 NOTE — Assessment & Plan Note (Addendum)
Mild to mod, for antibx course, CT sinus due to recurring frequent symptoms,  to f/u any worsening symptoms or concerns

## 2021-05-03 ENCOUNTER — Other Ambulatory Visit: Payer: Self-pay

## 2021-05-03 ENCOUNTER — Ambulatory Visit: Payer: Medicare HMO | Admitting: Podiatry

## 2021-05-03 ENCOUNTER — Ambulatory Visit (INDEPENDENT_AMBULATORY_CARE_PROVIDER_SITE_OTHER): Payer: Medicare HMO

## 2021-05-03 DIAGNOSIS — M7751 Other enthesopathy of right foot: Secondary | ICD-10-CM

## 2021-05-03 DIAGNOSIS — M7752 Other enthesopathy of left foot: Secondary | ICD-10-CM | POA: Diagnosis not present

## 2021-05-03 DIAGNOSIS — M79674 Pain in right toe(s): Secondary | ICD-10-CM | POA: Diagnosis not present

## 2021-05-03 DIAGNOSIS — M79675 Pain in left toe(s): Secondary | ICD-10-CM | POA: Diagnosis not present

## 2021-05-03 DIAGNOSIS — B351 Tinea unguium: Secondary | ICD-10-CM | POA: Diagnosis not present

## 2021-05-03 DIAGNOSIS — E0843 Diabetes mellitus due to underlying condition with diabetic autonomic (poly)neuropathy: Secondary | ICD-10-CM | POA: Diagnosis not present

## 2021-05-03 MED ORDER — BETAMETHASONE SOD PHOS & ACET 6 (3-3) MG/ML IJ SUSP
3.0000 mg | Freq: Once | INTRAMUSCULAR | Status: AC
  Administered 2021-05-03: 3 mg via INTRA_ARTICULAR

## 2021-05-03 NOTE — Progress Notes (Signed)
SUBJECTIVE Patient with a history of diabetes mellitus presents to office today complaining of elongated, thickened nails that cause pain while ambulating in shoes.  Patient is unable to trim their own nails.  Patient also states that he has been experiencing left great toe pain for several months now.  He denies a history of injury.  He says it is very symptomatic and painful especially with walking when he is not wearing good shoes.  He has not done anything currently for treatment.  Patient is here for further evaluation and treatment.   Past Medical History:  Diagnosis Date   Acute idiopathic pericarditis 04/08/2010   Qualifier: Diagnosis of  By: [name], [name]     Acute sinusitis 06/04/2014   Anxiety    CHF (congestive heart failure) ([place]) 06/29/2010   was pericardititis not CHF   Chronic back pain    Constipation    DDD (degenerative disc disease), lumbar 09/14/2012   Depression    Erectile dysfunction 09/14/2012   GERD (gastroesophageal reflux disease)    ocassional   Gout, unspecified 05/01/2007   Qualifier: Diagnosis of  By: [name] MD, Dellis Filbert A    Headache    migraines in the past   History of pancreatitis    a. admx 04-2009.Marland KitchenMarland Kitchen? 2-2 triglycerides   Hypertension    Hypertriglyceridemia    a. followed by [facility]   Left sided sciatica 09/14/2012   Neuropathy    Obesity    OSA (obstructive sleep apnea)    CPAP- not current   Restless legs    Type 2 diabetes mellitus with diabetic neuropathy, unspecified ([place]) 09/20/2019    OBJECTIVE General Patient is awake, alert, and oriented x 3 and in no acute distress. Derm Skin is dry and supple bilateral. Negative open lesions or macerations. Remaining integument unremarkable. Nails are tender, long, thickened and dystrophic with subungual debris, consistent with onychomycosis, 1-5 bilateral. No signs of infection noted. Vasc  DP and PT pedal pulses palpable bilaterally. Temperature gradient within normal limits.  Neuro  Epicritic and protective threshold sensation diminished bilaterally.  Musculoskeletal Exam Limited range of motion with pain on palpation and range of motion to the first MTP joint of the left foot consistent with a hallux limitus Radiographic exam B/L feet normal osseous mineralization.  Joint spaces mostly preserved.  No fractures identified.  There is some degenerative changes and periarticular spurring noted to the left first MTP joint.  On lateral view there does appear to be an avulsion fragment from the base of the proximal phalanx also visualized on medial oblique.  Chronic in nature.  ASSESSMENT 1. Diabetes Mellitus w/ peripheral neuropathy 2.  Pain due to onychomycosis of toenails bilateral 3.  Hallux limitus/MTP capsulitis left  PLAN OF CARE 1. Patient evaluated today. 2. Instructed to maintain good pedal hygiene and foot care. Stressed importance of controlling blood sugar.  3. Mechanical debridement of nails 1-5 bilaterally performed using a nail nipper. Filed with dremel without incident.  4.  Injection of 0.5 cc Celestone Soluspan injected the first MTP joint left  5.  Return to clinic in 3 mos.     Edrick Kins, DPM [facility]  Dr. Edrick Kins, DPM    [address]                                      [place], [place]  [place] [phone]  Fax [phone]

## 2021-05-07 DIAGNOSIS — M1711 Unilateral primary osteoarthritis, right knee: Secondary | ICD-10-CM | POA: Diagnosis not present

## 2021-05-12 ENCOUNTER — Other Ambulatory Visit: Payer: Medicare HMO

## 2021-05-14 ENCOUNTER — Other Ambulatory Visit: Payer: Medicare HMO

## 2021-05-14 DIAGNOSIS — M1711 Unilateral primary osteoarthritis, right knee: Secondary | ICD-10-CM | POA: Diagnosis not present

## 2021-05-19 DIAGNOSIS — M47817 Spondylosis without myelopathy or radiculopathy, lumbosacral region: Secondary | ICD-10-CM | POA: Diagnosis not present

## 2021-05-19 DIAGNOSIS — G894 Chronic pain syndrome: Secondary | ICD-10-CM | POA: Diagnosis not present

## 2021-05-19 DIAGNOSIS — M48062 Spinal stenosis, lumbar region with neurogenic claudication: Secondary | ICD-10-CM | POA: Diagnosis not present

## 2021-05-19 DIAGNOSIS — M25561 Pain in right knee: Secondary | ICD-10-CM | POA: Diagnosis not present

## 2021-05-21 ENCOUNTER — Ambulatory Visit
Admission: RE | Admit: 2021-05-21 | Discharge: 2021-05-21 | Disposition: A | Discharge status: Home / Self Care | Payer: Medicare HMO | Source: Ambulatory Visit | Attending: Internal Medicine | Admitting: Internal Medicine

## 2021-05-21 ENCOUNTER — Ambulatory Visit
Admission: RE | Admit: 2021-05-21 | Discharge: 2021-05-21 | Disposition: A | Discharge status: Home / Self Care | Payer: Medicare HMO | Source: Ambulatory Visit | Attending: Neurosurgery | Admitting: Neurosurgery

## 2021-05-21 DIAGNOSIS — Z981 Arthrodesis status: Secondary | ICD-10-CM | POA: Diagnosis not present

## 2021-05-21 DIAGNOSIS — J329 Chronic sinusitis, unspecified: Secondary | ICD-10-CM | POA: Diagnosis not present

## 2021-05-21 DIAGNOSIS — M5126 Other intervertebral disc displacement, lumbar region: Secondary | ICD-10-CM

## 2021-05-21 DIAGNOSIS — M5136 Other intervertebral disc degeneration, lumbar region: Secondary | ICD-10-CM | POA: Diagnosis not present

## 2021-05-21 DIAGNOSIS — M48061 Spinal stenosis, lumbar region without neurogenic claudication: Secondary | ICD-10-CM | POA: Diagnosis not present

## 2021-05-21 DIAGNOSIS — N200 Calculus of kidney: Secondary | ICD-10-CM | POA: Diagnosis not present

## 2021-05-21 DIAGNOSIS — J342 Deviated nasal septum: Secondary | ICD-10-CM | POA: Diagnosis not present

## 2021-05-23 ENCOUNTER — Encounter: Payer: Self-pay | Admitting: Internal Medicine

## 2021-05-25 ENCOUNTER — Other Ambulatory Visit: Payer: Self-pay | Admitting: Neurosurgery

## 2021-05-25 DIAGNOSIS — S32009K Unspecified fracture of unspecified lumbar vertebra, subsequent encounter for fracture with nonunion: Secondary | ICD-10-CM | POA: Diagnosis not present

## 2021-05-27 ENCOUNTER — Ambulatory Visit (INDEPENDENT_AMBULATORY_CARE_PROVIDER_SITE_OTHER): Payer: Medicare HMO | Admitting: Internal Medicine

## 2021-05-27 ENCOUNTER — Encounter: Payer: Self-pay | Admitting: Internal Medicine

## 2021-05-27 ENCOUNTER — Other Ambulatory Visit: Payer: Self-pay

## 2021-05-27 VITALS — BP 140/90 | HR 59 | Temp 98.6°F | Ht 71.0 in | Wt 246.0 lb

## 2021-05-27 DIAGNOSIS — J309 Allergic rhinitis, unspecified: Secondary | ICD-10-CM

## 2021-05-27 DIAGNOSIS — J342 Deviated nasal septum: Secondary | ICD-10-CM | POA: Diagnosis not present

## 2021-05-27 DIAGNOSIS — G8929 Other chronic pain: Secondary | ICD-10-CM | POA: Diagnosis not present

## 2021-05-27 DIAGNOSIS — S32009K Unspecified fracture of unspecified lumbar vertebra, subsequent encounter for fracture with nonunion: Secondary | ICD-10-CM | POA: Insufficient documentation

## 2021-05-27 DIAGNOSIS — F1120 Opioid dependence, uncomplicated: Secondary | ICD-10-CM | POA: Insufficient documentation

## 2021-05-27 DIAGNOSIS — M549 Dorsalgia, unspecified: Secondary | ICD-10-CM

## 2021-05-27 DIAGNOSIS — I10 Essential (primary) hypertension: Secondary | ICD-10-CM

## 2021-05-27 DIAGNOSIS — M7051 Other bursitis of knee, right knee: Secondary | ICD-10-CM | POA: Insufficient documentation

## 2021-05-27 DIAGNOSIS — J343 Hypertrophy of nasal turbinates: Secondary | ICD-10-CM | POA: Insufficient documentation

## 2021-05-27 MED ORDER — PREDNISONE 10 MG PO TABS: ORAL_TABLET | ORAL | 0 refills | Status: DC

## 2021-05-27 MED ORDER — METHYLPREDNISOLONE ACETATE 80 MG/ML IJ SUSP
80.0000 mg | Freq: Once | INTRAMUSCULAR | Status: AC
  Administered 2021-05-27: 80 mg via INTRAMUSCULAR

## 2021-05-27 NOTE — Progress Notes (Signed)
Patient ID: Harold [name], male   DOB: [birth year], 56 y.o.   MRN: [id number]        Chief Complaint: follow up allergies and deviated nasal septum       HPI:  Harold [name] is a 56 y.o. male with several wks ongoing nasal allergy symptoms with clearish congestion, itch and sneezing, without fever, pain, ST, cough, swelling or wheezing.  Also had recent feb 2023 CT sinus with minor swelling, no obruction or cyst or other except for deviated nasal septum.  Pt denies chest pain, increased sob or doe, wheezing, orthopnea, PND, increased LE swelling, palpitations, dizziness or syncope.   Pt denies polydipsia, polyuria, or new focal neuro s/s.        Wt Readings from Last 3 Encounters:  05/27/21 246 lb (111.6 kg)  04/28/21 258 lb 3.2 oz (117.1 kg)  03/08/21 258 lb (117 kg)   BP Readings from Last 3 Encounters:  05/27/21 140/90  04/28/21 (!) 142/88  03/08/21 (!) 146/90         Past Medical History:  Diagnosis Date   Acute idiopathic pericarditis 04/08/2010   Qualifier: Diagnosis of  By: [name], [name]     Acute sinusitis 06/04/2014   Anxiety    CHF (congestive heart failure) ([place]) 06/29/2010   was pericardititis not CHF   Chronic back pain    Constipation    DDD (degenerative disc disease), lumbar 09/14/2012   Depression    Erectile dysfunction 09/14/2012   GERD (gastroesophageal reflux disease)    ocassional   Gout, unspecified 05/01/2007   Qualifier: Diagnosis of  By: [name] MD, Dellis Filbert A    Headache    migraines in the past   History of pancreatitis    a. admx 04-2009.Marland KitchenMarland Kitchen? 2-2 triglycerides   Hypertension    Hypertriglyceridemia    a. followed by [facility]   Left sided sciatica 09/14/2012   Neuropathy    Obesity    OSA (obstructive sleep apnea)    CPAP- not current   Restless legs    Type 2 diabetes mellitus with diabetic neuropathy, unspecified ([place]) 09/20/2019   Past Surgical History:  Procedure Laterality Date   ANTERIOR CERVICAL DECOMP/DISCECTOMY FUSION N/A 09/14/2020    Procedure: Anterior Cervical Decompression Fusion - Cervical three-Cervical four;  Surgeon: Kary Kos, MD;  Location: [facility];  Service: Neurosurgery;  Laterality: N/A;   COLONOSCOPY     KNEE SURGERY Right    x3 scopes   KNEE SURGERY Right    open menisicus repair   LUMBAR LAMINECTOMY/DECOMPRESSION MICRODISCECTOMY N/A 08/26/2015   Procedure: Lumbar Two-Sacral One  Laminectomy for decompression;  Surgeon: [name], MD;  Location: [place] [facility];  Service: Neurosurgery;  Laterality: N/A;   pericardectomy  07/15/2010   Dr. [name]   pleurx catheter placement  07/15/2010   Dr. [name]   SHOULDER SURGERY     right and left shoulders- Rotator cuff repair   TONSILLECTOMY AND ADENOIDECTOMY     one tonsil    reports that he has been smoking cigarettes. He has a 11.88 pack-year smoking history. He has never used smokeless tobacco. He reports current alcohol use. He reports that he does not use drugs. family history includes Colon cancer (age of onset: 68) in his father; Diabetes in his father, mother, and sister; Heart disease (age of onset: 69) in his father; Hypertension in an other family member; Liver cancer in his paternal uncle; Pancreatitis in his father and paternal uncle; Stomach cancer in  his maternal grandfather. Allergies  Allergen Reactions   Other Itching, Swelling and Palpitations    Pecans: itching and swelling of the tongue    Diphenhydramine Itching, Palpitations and Other (See Comments)    "jittery" Pt states he can take Benadryl in an emergency situation   Pecan Nut (Diagnostic)    Colchicine Diarrhea   Lipitor [Atorvastatin] Other (See Comments)    Muscle cramps   Septra [Sulfamethoxazole-Trimethoprim] Itching   Current Outpatient Medications on File Prior to Visit  Medication Sig Dispense Refill   amLODipine (NORVASC) 10 MG tablet TAKE 1 TABLET(10 MG) BY MOUTH DAILY 30 tablet 0   ascorbic acid (VITAMIN C) 500 MG tablet Take 500 mg by mouth daily.     aspirin  EC 81 MG tablet Take 81 mg by mouth daily.     Blood Glucose Monitoring Suppl (ONE TOUCH ULTRA 2) w/Device KIT Use as directed daily E11.9 1 each 0   citalopram (CELEXA) 40 MG tablet TAKE 1 TABLET(40 MG) BY MOUTH DAILY 90 tablet 1   diclofenac Sodium (VOLTAREN) 1 % GEL Apply 1 application topically 3 (three) times daily as needed (PAIN). 150 g 0   docusate sodium (COLACE) 100 MG capsule Take 1 capsule (100 mg total) by mouth 2 (two) times daily. Purchase over the counter 60 capsule 0   fenofibrate (TRICOR) 145 MG tablet TAKE 1 TABLET(145 MG) BY MOUTH DAILY 90 tablet 1   FLUBLOK QUADRIVALENT 0.5 ML injection      fluconazole (DIFLUCAN) 150 MG tablet Take 1 tablet (150 mg total) by mouth every 3 (three) days. 2 tablet 0   fluticasone (FLONASE) 50 MCG/ACT nasal spray Place 2 sprays into both nostrils daily. 16 g 6   gabapentin (NEURONTIN) 300 MG capsule Take 900 mg by mouth 3 (three) times daily.     glucose blood test strip Use as instructed once daily E119 100 each 12   guaiFENesin-dextromethorphan (ROBITUSSIN DM) 100-10 MG/5ML syrup Take 5 mLs by mouth every 6 (six) hours as needed for cough. 118 mL 1   hydrochlorothiazide (MICROZIDE) 12.5 MG capsule Take 1 capsule (12.5 mg total) by mouth daily. 90 capsule 3   hydrocortisone (ANUSOL-HC) 25 MG suppository Place 1 suppository (25 mg total) rectally every 12 (twelve) hours. 12 suppository 1   Lancets MISC Use as directed daily E11.9 100 each 11   lidocaine (LIDODERM) 5 % Place 1 patch onto the skin daily. Can apply to back on at 8 am and off at 8 pm --has to be off for 12 hours. Purchase over the counter. 30 patch 0   lisinopril (ZESTRIL) 40 MG tablet Take 1 tablet (40 mg total) by mouth daily. 90 tablet 2   loratadine (CLARITIN) 10 MG tablet Take 1 tablet (10 mg total) by mouth daily as needed for allergies. 90 tablet 3   magnesium oxide (MAG-OX) 400 MG tablet Take 400 mg by mouth daily.     metFORMIN (GLUCOPHAGE-XR) 500 MG 24 hr tablet TAKE 2  TABLETS BY MOUTH DAILY WITH BREAKFAST 180 tablet 3   methocarbamol (ROBAXIN) 500 MG tablet Take 1 tablet (500 mg total) by mouth every 6 (six) hours as needed for muscle spasms. 90 tablet 0   Multiple Minerals-Vitamins (CALCIUM-MAGNESIUM-ZINC-D3) TABS Take 1 tablet by mouth daily.     ORTHOVISC 30 MG/2ML SOSY SMARTSIG:I-ARTIC     oxyCODONE-acetaminophen (PERCOCET) 10-325 MG tablet Take 1 tablet by mouth every 6 (six) hours as needed for pain. 30 tablet 0   Potassium Chloride ER 20  MEQ TBCR TAKE ONE TABLET BY MOUTH DAILY 90 tablet 2   rosuvastatin (CRESTOR) 20 MG tablet TAKE 1 TABLET(20 MG) BY MOUTH DAILY 30 tablet 0   sildenafil (VIAGRA) 100 MG tablet Take 0.5-1 tablets (50-100 mg total) by mouth daily as needed for erectile dysfunction. 5 tablet 11   tolterodine (DETROL LA) 4 MG 24 hr capsule Take 1 capsule (4 mg total) by mouth daily. 90 capsule 3   vitamin B-12 (CYANOCOBALAMIN) 1000 MCG tablet Take 1 tablet (1,000 mcg total) by mouth daily. 90 tablet 1   zinc gluconate 50 MG tablet Take 50 mg by mouth daily.     No current facility-administered medications on file prior to visit.        ROS:  All others reviewed and negative.  Objective        PE:  BP 140/90 (BP Location: Left Arm, Patient Position: Sitting, Cuff Size: Large)    Pulse (!) 59    Temp 98.6 F (37 C) (Oral)    Ht '5\' 11"'  (1.803 m)    Wt 246 lb (111.6 kg)    SpO2 94%    BMI 34.31 kg/m                 Constitutional: Pt appears in NAD               HENT: Head: NCAT.                Right Ear: External ear normal.                 Left Ear: External ear normal. Bilat tm's with mild erythema.  Max sinus areas non tender.  Pharynx with mild erythema, no exudate               Eyes: . Pupils are equal, round, and reactive to light. Conjunctivae and EOM are normal               Nose: without d/c or deformity               Neck: Neck supple. Gross normal ROM               Cardiovascular: Normal rate and regular rhythm.                  Pulmonary/Chest: Effort normal and breath sounds without rales or wheezing.                Abd:  Soft, NT, ND, + BS, no organomegaly               Neurological: Pt is alert. At baseline orientation, motor grossly intact               Skin: Skin is warm. No rashes, no other new lesions, LE edema - none               Psychiatric: Pt behavior is normal without agitation   Micro: none  Cardiac tracings I have personally interpreted today:  none  Pertinent Radiological findings (summarize): none   Lab Results  Component Value Date   WBC 11.5 (H) 03/08/2021   HGB 14.2 03/08/2021   HCT 43.2 03/08/2021   PLT 419.0 (H) 03/08/2021   GLUCOSE 99 03/08/2021   CHOL 180 03/08/2021   TRIG 338.0 (H) 03/08/2021   HDL 23.50 (L) 03/08/2021   LDLDIRECT 121.0 03/08/2021   LDLCALC 85 08/07/2019   ALT 13 03/08/2021   AST 15 03/08/2021  NA 141 03/08/2021   K 3.9 03/08/2021   CL 101 03/08/2021   CREATININE 1.01 03/08/2021   BUN 14 03/08/2021   CO2 28 03/08/2021   TSH 0.60 03/08/2021   PSA 2.45 03/08/2021   INR 1.72 (H) 07/05/2010   HGBA1C 6.5 03/08/2021   MICROALBUR 1.9 03/08/2021   Assessment/Plan:  Harold [name] is a 56 y.o. Black or African American [2] male with  has a past medical history of Acute idiopathic pericarditis (04/08/2010), Acute sinusitis (06/04/2014), Anxiety, CHF (congestive heart failure) ([place]) (06/29/2010), Chronic back pain, Constipation, DDD (degenerative disc disease), lumbar (09/14/2012), Depression, Erectile dysfunction (09/14/2012), GERD (gastroesophageal reflux disease), Gout, unspecified (05/01/2007), Headache, History of pancreatitis, Hypertension, Hypertriglyceridemia, Left sided sciatica (09/14/2012), Neuropathy, Obesity, OSA (obstructive sleep apnea), Restless legs, and Type 2 diabetes mellitus with diabetic neuropathy, unspecified ([place]) (09/20/2019).  Essential hypertension BP Readings from Last 3 Encounters:  05/27/21 140/90  04/28/21 (!) 142/88  03/08/21 (!) 146/90    Stable, pt to continue medical treatment norvasc, hct, lisinpri    Deviated nasal septum D/w pt, wants to hold on ENT referral for now  Chronic back pain greater than 3 months duration For back surgury in 2 wks,  to f/u any worsening symptoms or concerns  Allergic rhinitis Mild to mod seasonal flare, for depomedrol im 80, prednisone asd, refer allergy,  to f/u any worsening symptoms or concerns  Followup: Return if symptoms worsen or fail to improve.  Cathlean Cower, MD 05/30/2021 6:20 PM [facility]

## 2021-05-27 NOTE — Patient Instructions (Addendum)
You had the steroid shot today ? ?Please take all new medication as prescribed - the prednisone ? ?You will be contacted regarding the referral for: allergy ? ?Please continue all other medications as before, and refills have been done if requested. ? ?Please have the pharmacy call with any other refills you may need. ? ?Please keep your appointments with your specialists as you may have planned ? ?Harold [name] with your surgury! ? ? ? ? ?

## 2021-05-30 ENCOUNTER — Encounter: Payer: Self-pay | Admitting: Internal Medicine

## 2021-05-30 NOTE — Assessment & Plan Note (Signed)
Mild to mod seasonal flare, for depomedrol im 80, prednisone asd, refer allergy,  to f/u any worsening symptoms or concerns ?

## 2021-05-30 NOTE — Assessment & Plan Note (Signed)
For back surgury in 2 wks,  to f/u any worsening symptoms or concerns ?

## 2021-05-30 NOTE — Assessment & Plan Note (Signed)
D/w pt, wants to hold on ENT referral for now ?

## 2021-05-30 NOTE — Assessment & Plan Note (Signed)
BP Readings from Last 3 Encounters:  ?05/27/21 140/90  ?04/28/21 (!) 142/88  ?03/08/21 (!) 146/90  ? ?Stable, pt to continue medical treatment norvasc, hct, lisinpri ?  ?

## 2021-06-07 NOTE — Progress Notes (Signed)
Surgical Instructions    Your procedure is scheduled on 06/11/21.  Report to [facility] Main Entrance "A" at 5:30 A.M., then check in with the Admitting office.  Call this number if you have problems the morning of surgery:  [phone]   If you have any questions prior to your surgery date call [phone]: Open Monday-Friday 8am-4pm    Remember:  Do not eat or drink after midnight the night before your surgery      Take these medicines the morning of surgery with A SIP OF WATER:  amLODipine (NORVASC) citalopram (CELEXA) fenofibrate (TRICOR) fluconazole (DIFLUCAN) fluticasone (FLONASE) gabapentin (NEURONTIN) predniSONE (DELTASONE) rosuvastatin (CRESTOR) tolterodine (DETROL LA)   IF NEEDED: guaiFENesin-dextromethorphan (ROBITUSSIN DM)  loratadine (CLARITIN) methocarbamol (ROBAXIN) oxyCODONE-acetaminophen (PERCOCET)  As of today, STOP taking any Aspirin (unless otherwise instructed by your surgeon) Aleve, Naproxen, Ibuprofen, Motrin, Advil, Goody's, BC's, all herbal medications, fish oil, diclofenac Sodium (VOLTAREN) and all vitamins.  WHAT DO I DO ABOUT MY DIABETES MEDICATION?   Do not take oral diabetes medicines (pills) the morning of surgery.    THE MORNING OF SURGERY, do not take metFORMIN (GLUCOPHAGE-XR).  The day of surgery, do not take other diabetes injectables, including Byetta (exenatide), Bydureon (exenatide ER), Victoza (liraglutide), or Trulicity (dulaglutide).  If your CBG is greater than 220 mg/dL, you may take  of your sliding scale (correction) dose of insulin.   HOW TO MANAGE YOUR DIABETES BEFORE AND AFTER SURGERY  Why is it important to control my blood sugar before and after surgery? Improving blood sugar levels before and after surgery helps healing and can limit problems. A way of improving blood sugar control is eating a healthy diet by:  Eating less sugar and carbohydrates  Increasing activity/exercise  Talking with your doctor about  reaching your blood sugar goals High blood sugars (greater than 180 mg/dL) can raise your risk of infections and slow your recovery, so you will need to focus on controlling your diabetes during the weeks before surgery. Make sure that the doctor who takes care of your diabetes knows about your planned surgery including the date and location.  How do I manage my blood sugar before surgery? Check your blood sugar at least 4 times a day, starting 2 days before surgery, to make sure that the level is not too high or low.  Check your blood sugar the morning of your surgery when you wake up and every 2 hours until you get to the Short Stay unit.  If your blood sugar is less than 70 mg/dL, you will need to treat for low blood sugar: Do not take insulin. Treat a low blood sugar (less than 70 mg/dL) with  cup of clear juice (cranberry or apple), 4 glucose tablets, OR glucose gel. Recheck blood sugar in 15 minutes after treatment (to make sure it is greater than 70 mg/dL). If your blood sugar is not greater than 70 mg/dL on recheck, call [phone] for further instructions. Report your blood sugar to the short stay nurse when you get to Short Stay.  If you are admitted to the hospital after surgery: Your blood sugar will be checked by the staff and you will probably be given insulin after surgery (instead of oral diabetes medicines) to make sure you have good blood sugar levels. The goal for blood sugar control after surgery is 80-180 mg/dL.           Do not wear jewelry or makeup Do not wear lotions, powders, perfumes/colognes, or deodorant. Men  may shave face and neck. Do not bring valuables to the hospital. Do not wear nail polish, gel polish, artificial nails, or any other type of covering on natural nails (fingers and toes) If you have artificial nails or gel coating that need to be removed by a nail salon, please have this removed prior to surgery. Artificial nails or gel coating may  interfere with anesthesia's ability to adequately monitor your vital signs.  [facility] is not responsible for any belongings or valuables. .   Do NOT Smoke (Tobacco/Vaping)  24 hours prior to your procedure  If you use a CPAP at night, you may bring your mask for your overnight stay.   Contacts, glasses, hearing aids, dentures or partials may not be worn into surgery, please bring cases for these belongings   For patients admitted to the hospital, discharge time will be determined by your treatment team.   Patients discharged the day of surgery will not be allowed to drive home, and someone needs to stay with them for 24 hours.  NO VISITORS WILL BE ALLOWED IN PRE-OP WHERE PATIENTS ARE PREPPED FOR SURGERY.  ONLY 1 SUPPORT PERSON MAY BE PRESENT IN THE WAITING ROOM WHILE YOU ARE IN SURGERY.  IF YOU ARE TO BE ADMITTED, ONCE YOU ARE IN YOUR ROOM YOU WILL BE ALLOWED TWO (2) VISITORS. 1 (ONE) VISITOR MAY STAY OVERNIGHT BUT MUST ARRIVE TO THE ROOM BY 8pm.  Minor children may have two parents present. Special consideration for safety and communication needs will be reviewed on a case by case basis.  Special instructions:    Oral Hygiene is also important to reduce your risk of infection.  Remember - BRUSH YOUR TEETH THE MORNING OF SURGERY WITH YOUR REGULAR TOOTHPASTE   [facility]- Preparing For Surgery  Before surgery, you can play an important role. Because skin is not sterile, your skin needs to be as free of germs as possible. You can reduce the number of germs on your skin by washing with CHG (chlorahexidine gluconate) Soap before surgery.  CHG is an antiseptic cleaner which kills germs and bonds with the skin to continue killing germs even after washing.     Please do not use if you have an allergy to CHG or antibacterial soaps. If your skin becomes reddened/irritated stop using the CHG.  Do not shave (including legs and underarms) for at least 48 hours prior to first CHG shower. It is OK  to shave your face.  Please follow these instructions carefully.     Shower the NIGHT BEFORE SURGERY and the MORNING OF SURGERY with CHG Soap.   If you chose to wash your hair, wash your hair first as usual with your normal shampoo. After you shampoo, rinse your hair and body thoroughly to remove the shampoo.  Then [employer] and genitals (private parts) with your normal soap and rinse thoroughly to remove soap.  After that Use CHG Soap as you would any other liquid soap. You can apply CHG directly to the skin and wash gently with a scrungie or a clean washcloth.   Apply the CHG Soap to your body ONLY FROM THE NECK DOWN.  Do not use on open wounds or open sores. Avoid contact with your eyes, ears, mouth and genitals (private parts). Wash Face and genitals (private parts)  with your normal soap.   Wash thoroughly, paying special attention to the area where your surgery will be performed.  Thoroughly rinse your body with warm water from the  neck down.  DO NOT shower/wash with your normal soap after using and rinsing off the CHG Soap.  Pat yourself dry with a CLEAN TOWEL.  Wear CLEAN PAJAMAS to bed the night before surgery  Place CLEAN SHEETS on your bed the night before your surgery  DO NOT SLEEP WITH PETS.   Day of Surgery: Take a shower with CHG soap. Wear Clean/Comfortable clothing the morning of surgery Do not apply any deodorants/lotions.   Remember to brush your teeth WITH YOUR REGULAR TOOTHPASTE.    COVID testing  If you are going to stay overnight or be admitted after your procedure/surgery and require a pre-op COVID test, please follow these instructions after your COVID test   You are not required to quarantine however you are required to wear a well-fitting mask when you are out and around people not in your household.  If your mask becomes wet or soiled, replace with a new one.  Wash your hands often with soap and water for 20 seconds or clean your hands with an  alcohol-based hand sanitizer that contains at least 60% alcohol.  Do not share personal items.  Notify your provider: if you are in close contact with someone who has COVID  or if you develop a fever of 100.4 or greater, sneezing, cough, sore throat, shortness of breath or body aches.    Please read over the following fact sheets that you were given.

## 2021-06-08 ENCOUNTER — Inpatient Hospital Stay (HOSPITAL_COMMUNITY)
Admission: RE | Admit: 2021-06-08 | Discharge: 2021-06-08 | Disposition: A | Discharge status: Home / Self Care | Payer: Medicare HMO | Source: Ambulatory Visit

## 2021-06-09 NOTE — Progress Notes (Addendum)
Surgical Instructions ? ? ? Your procedure is scheduled on 06/11/21. ? Report to [facility] Main Entrance "A" at 5:30 A.M., then check in with the Admitting office. ? Call this number if you have problems the morning of surgery: ? [phone] ? ? If you have any questions prior to your surgery date call [phone]: Open Monday-Friday 8am-4pm ? ? ? Remember: ? Do not eat or drink after midnight the night before your surgery ? ? ?  ? Take these medicines the morning of surgery with A SIP OF WATER:  ?amLODipine (NORVASC) ?citalopram (CELEXA) ?fenofibrate (TRICOR) ?fluconazole (DIFLUCAN) ?fluticasone (FLONASE) ?gabapentin (NEURONTIN) ?predniSONE (DELTASONE) ?rosuvastatin (CRESTOR) ?tolterodine (DETROL LA)  ? ?IF NEEDED: ?guaiFENesin-dextromethorphan (ROBITUSSIN DM)  ?loratadine (CLARITIN) ?oxyCODONE-acetaminophen (PERCOCET) ? ?Follow your surgeon's instructions on when to stop Aspirin.  If no instructions were given by your surgeon then you will need to call the office to get those instructions.   ? ?As of today, STOP taking any Aleve, Naproxen, Ibuprofen, Motrin, Advil, Goody's, BC's, all herbal medications, fish oil, diclofenac Sodium (VOLTAREN) and all vitamins. ? ?WHAT DO I DO ABOUT MY DIABETES MEDICATION? ? ?THE MORNING OF SURGERY, do not take metFORMIN (GLUCOPHAGE-XR). ? ? ?HOW TO MANAGE YOUR DIABETES ?BEFORE AND AFTER SURGERY ? ?Why is it important to control my blood sugar before and after surgery? ?Improving blood sugar levels before and after surgery helps healing and can limit problems. ?A way of improving blood sugar control is eating a healthy diet by: ? Eating less sugar and carbohydrates ? Increasing activity/exercise ? Talking with your doctor about reaching your blood sugar goals ?High blood sugars (greater than 180 mg/dL) can raise your risk of infections and slow your recovery, so you will need to focus on controlling your diabetes during the weeks before surgery. ?Make sure that the doctor who  takes care of your diabetes knows about your planned surgery including the date and location. ? ?How do I manage my blood sugar before surgery? ?Check your blood sugar at least 4 times a day, starting 2 days before surgery, to make sure that the level is not too high or low. ? ?Check your blood sugar the morning of your surgery when you wake up and every 2 hours until you get to the Short Stay unit. ? ?If your blood sugar is less than 70 mg/dL, you will need to treat for low blood sugar: ?Do not take insulin. ?Treat a low blood sugar (less than 70 mg/dL) with ? cup of clear juice (cranberry or apple), 4 glucose tablets, OR glucose gel. ?Recheck blood sugar in 15 minutes after treatment (to make sure it is greater than 70 mg/dL). If your blood sugar is not greater than 70 mg/dL on recheck, call [phone] for further instructions. ?Report your blood sugar to the short stay nurse when you get to Short Stay. ? ?If you are admitted to the hospital after surgery: ?Your blood sugar will be checked by the staff and you will probably be given insulin after surgery (instead of oral diabetes medicines) to make sure you have good blood sugar levels. ?The goal for blood sugar control after surgery is 80-180 mg/dL. ? ?         ?Do not wear jewelry. ?Do not wear lotions, powders, colognes, or deodorant. ?Men may shave face and neck. ?Do not bring valuables to the hospital. ? ?[facility] is not responsible for any belongings or valuables. .  ? ?Do NOT Smoke (Tobacco/Vaping)  24 hours prior to your  procedure ? ?If you use a CPAP at night, you may bring your mask for your overnight stay. ?  ?Contacts, glasses, hearing aids, dentures or partials may not be worn into surgery, please bring cases for these belongings ?  ?For patients admitted to the hospital, discharge time will be determined by your treatment team. ?  ?Patients discharged the day of surgery will not be allowed to drive home, and someone needs to stay with them for  24 hours. ? ?NO VISITORS WILL BE ALLOWED IN PRE-OP WHERE PATIENTS ARE PREPPED FOR SURGERY.  ONLY 1 SUPPORT PERSON MAY BE PRESENT IN THE WAITING ROOM WHILE YOU ARE IN SURGERY.  IF YOU ARE TO BE ADMITTED, ONCE YOU ARE IN YOUR ROOM YOU WILL BE ALLOWED TWO (2) VISITORS. 1 (ONE) VISITOR MAY STAY OVERNIGHT BUT MUST ARRIVE TO THE ROOM BY 8pm.  Minor children may have two parents present. Special consideration for safety and communication needs will be reviewed on a case by case basis. ? ?Special instructions:   ? ?Oral Hygiene is also important to reduce your risk of infection.  Remember - BRUSH YOUR TEETH THE MORNING OF SURGERY WITH YOUR REGULAR TOOTHPASTE ? ? ?[facility]- Preparing For Surgery ? ?Before surgery, you can play an important role. Because skin is not sterile, your skin needs to be as free of germs as possible. You can reduce the number of germs on your skin by washing with CHG (chlorahexidine gluconate) Soap before surgery.  CHG is an antiseptic cleaner which kills germs and bonds with the skin to continue killing germs even after washing.   ? ? ?Please do not use if you have an allergy to CHG or antibacterial soaps. If your skin becomes reddened/irritated stop using the CHG.  ?Do not shave (including legs and underarms) for at least 48 hours prior to first CHG shower. It is OK to shave your face. ? ?Please follow these instructions carefully. ?  ? ? Shower the NIGHT BEFORE SURGERY and the MORNING OF SURGERY with CHG Soap.  ? If you chose to wash your hair, wash your hair first as usual with your normal shampoo. After you shampoo, rinse your hair and body thoroughly to remove the shampoo.  Then [employer] and genitals (private parts) with your normal soap and rinse thoroughly to remove soap. ? ?After that Use CHG Soap as you would any other liquid soap. You can apply CHG directly to the skin and wash gently with a scrungie or a clean washcloth.  ? ?Apply the CHG Soap to your body ONLY FROM THE NECK DOWN.  Do  not use on open wounds or open sores. Avoid contact with your eyes, ears, mouth and genitals (private parts). Wash Face and genitals (private parts)  with your normal soap.  ? ?Wash thoroughly, paying special attention to the area where your surgery will be performed. ? ?Thoroughly rinse your body with warm water from the neck down. ? ?DO NOT shower/wash with your normal soap after using and rinsing off the CHG Soap. ? ?Pat yourself dry with a CLEAN TOWEL. ? ?Wear CLEAN PAJAMAS to bed the night before surgery ? ?Place CLEAN SHEETS on your bed the night before your surgery ? ?DO NOT SLEEP WITH PETS. ? ? ?Day of Surgery: ?Take a shower with CHG soap. ?Wear Clean/Comfortable clothing the morning of surgery ?Do not apply any deodorants/lotions.   ?Remember to brush your teeth WITH YOUR REGULAR TOOTHPASTE. ?  ?Please read over the following fact sheets  that you were given.  ? ?

## 2021-06-10 ENCOUNTER — Encounter (HOSPITAL_COMMUNITY)
Admission: RE | Admit: 2021-06-10 | Discharge: 2021-06-10 | Disposition: A | Discharge status: Home / Self Care | Payer: Medicare HMO | Source: Ambulatory Visit | Attending: Neurosurgery | Admitting: Neurosurgery

## 2021-06-10 ENCOUNTER — Other Ambulatory Visit: Payer: Self-pay

## 2021-06-10 ENCOUNTER — Encounter (HOSPITAL_COMMUNITY): Payer: Self-pay

## 2021-06-10 VITALS — BP 166/99 | HR 57 | Temp 98.3°F | Resp 18 | Ht 71.0 in | Wt 258.7 lb

## 2021-06-10 DIAGNOSIS — L905 Scar conditions and fibrosis of skin: Secondary | ICD-10-CM | POA: Diagnosis present

## 2021-06-10 DIAGNOSIS — Z6836 Body mass index (BMI) 36.0-36.9, adult: Secondary | ICD-10-CM | POA: Diagnosis not present

## 2021-06-10 DIAGNOSIS — M960 Pseudarthrosis after fusion or arthrodesis: Secondary | ICD-10-CM | POA: Insufficient documentation

## 2021-06-10 DIAGNOSIS — Z881 Allergy status to other antibiotic agents status: Secondary | ICD-10-CM | POA: Diagnosis not present

## 2021-06-10 DIAGNOSIS — F419 Anxiety disorder, unspecified: Secondary | ICD-10-CM | POA: Diagnosis present

## 2021-06-10 DIAGNOSIS — Z79899 Other long term (current) drug therapy: Secondary | ICD-10-CM | POA: Diagnosis not present

## 2021-06-10 DIAGNOSIS — M109 Gout, unspecified: Secondary | ICD-10-CM | POA: Diagnosis present

## 2021-06-10 DIAGNOSIS — M4326 Fusion of spine, lumbar region: Secondary | ICD-10-CM | POA: Diagnosis not present

## 2021-06-10 DIAGNOSIS — G4733 Obstructive sleep apnea (adult) (pediatric): Secondary | ICD-10-CM | POA: Diagnosis present

## 2021-06-10 DIAGNOSIS — I2510 Atherosclerotic heart disease of native coronary artery without angina pectoris: Secondary | ICD-10-CM | POA: Insufficient documentation

## 2021-06-10 DIAGNOSIS — I110 Hypertensive heart disease with heart failure: Secondary | ICD-10-CM | POA: Insufficient documentation

## 2021-06-10 DIAGNOSIS — S32030A Wedge compression fracture of third lumbar vertebra, initial encounter for closed fracture: Secondary | ICD-10-CM | POA: Diagnosis not present

## 2021-06-10 DIAGNOSIS — Z7982 Long term (current) use of aspirin: Secondary | ICD-10-CM | POA: Diagnosis not present

## 2021-06-10 DIAGNOSIS — R9431 Abnormal electrocardiogram [ECG] [EKG]: Secondary | ICD-10-CM | POA: Insufficient documentation

## 2021-06-10 DIAGNOSIS — K219 Gastro-esophageal reflux disease without esophagitis: Secondary | ICD-10-CM | POA: Diagnosis present

## 2021-06-10 DIAGNOSIS — T84296A Other mechanical complication of internal fixation device of vertebrae, initial encounter: Secondary | ICD-10-CM | POA: Diagnosis not present

## 2021-06-10 DIAGNOSIS — Z833 Family history of diabetes mellitus: Secondary | ICD-10-CM | POA: Diagnosis not present

## 2021-06-10 DIAGNOSIS — E119 Type 2 diabetes mellitus without complications: Secondary | ICD-10-CM | POA: Insufficient documentation

## 2021-06-10 DIAGNOSIS — Z91018 Allergy to other foods: Secondary | ICD-10-CM | POA: Diagnosis not present

## 2021-06-10 DIAGNOSIS — I509 Heart failure, unspecified: Secondary | ICD-10-CM | POA: Insufficient documentation

## 2021-06-10 DIAGNOSIS — G2581 Restless legs syndrome: Secondary | ICD-10-CM | POA: Diagnosis present

## 2021-06-10 DIAGNOSIS — Z7984 Long term (current) use of oral hypoglycemic drugs: Secondary | ICD-10-CM | POA: Diagnosis not present

## 2021-06-10 DIAGNOSIS — X58XXXD Exposure to other specified factors, subsequent encounter: Secondary | ICD-10-CM | POA: Insufficient documentation

## 2021-06-10 DIAGNOSIS — Z8249 Family history of ischemic heart disease and other diseases of the circulatory system: Secondary | ICD-10-CM | POA: Insufficient documentation

## 2021-06-10 DIAGNOSIS — Z01812 Encounter for preprocedural laboratory examination: Secondary | ICD-10-CM | POA: Insufficient documentation

## 2021-06-10 DIAGNOSIS — E781 Pure hyperglyceridemia: Secondary | ICD-10-CM | POA: Diagnosis present

## 2021-06-10 DIAGNOSIS — Z888 Allergy status to other drugs, medicaments and biological substances status: Secondary | ICD-10-CM | POA: Diagnosis not present

## 2021-06-10 DIAGNOSIS — Z981 Arthrodesis status: Secondary | ICD-10-CM | POA: Diagnosis not present

## 2021-06-10 DIAGNOSIS — E1140 Type 2 diabetes mellitus with diabetic neuropathy, unspecified: Secondary | ICD-10-CM | POA: Diagnosis present

## 2021-06-10 DIAGNOSIS — T84226A Displacement of internal fixation device of vertebrae, initial encounter: Secondary | ICD-10-CM | POA: Diagnosis present

## 2021-06-10 DIAGNOSIS — F17210 Nicotine dependence, cigarettes, uncomplicated: Secondary | ICD-10-CM | POA: Diagnosis present

## 2021-06-10 DIAGNOSIS — E669 Obesity, unspecified: Secondary | ICD-10-CM | POA: Diagnosis present

## 2021-06-10 LAB — GLUCOSE, CAPILLARY: Glucose-Capillary: 104 mg/dL — ABNORMAL HIGH (ref 70–99)

## 2021-06-10 LAB — CBC
HCT: 43.5 % (ref 39.0–52.0)
Hemoglobin: 13.9 g/dL (ref 13.0–17.0)
MCH: 30.9 pg (ref 26.0–34.0)
MCHC: 32 g/dL (ref 30.0–36.0)
MCV: 96.7 fL (ref 80.0–100.0)
Platelets: 471 10*3/uL — ABNORMAL HIGH (ref 150–400)
RBC: 4.5 MIL/uL (ref 4.22–5.81)
RDW: 13.5 % (ref 11.5–15.5)
WBC: 12.4 10*3/uL — ABNORMAL HIGH (ref 4.0–10.5)
nRBC: 0 % (ref 0.0–0.2)

## 2021-06-10 LAB — BASIC METABOLIC PANEL
Anion gap: 12 (ref 5–15)
BUN: 9 mg/dL (ref 6–20)
CO2: 25 mmol/L (ref 22–32)
Calcium: 9.2 mg/dL (ref 8.9–10.3)
Chloride: 102 mmol/L (ref 98–111)
Creatinine, Ser: 0.79 mg/dL (ref 0.61–1.24)
GFR, Estimated: >60 mL/min (ref >60)
Glucose, Bld: 98 mg/dL (ref 70–99)
Potassium: 4.3 mmol/L (ref 3.5–5.1)
Sodium: 139 mmol/L (ref 135–145)

## 2021-06-10 LAB — TYPE AND SCREEN
ABO/RH(D): A POS
Antibody Screen: NEGATIVE

## 2021-06-10 LAB — SURGICAL PCR SCREEN
MRSA, PCR: NEGATIVE
Staphylococcus aureus: NEGATIVE

## 2021-06-10 NOTE — Anesthesia Preprocedure Evaluation (Addendum)
Anesthesia Evaluation  ?Patient identified by MRN, date of birth, ID band ?Patient awake ? ? ? ?Reviewed: ?Allergy & Precautions, NPO status , Patient's Chart, lab work & pertinent test results ? ?Airway ?Mallampati: II ? ?TM Distance: >3 FB ?Neck ROM: Full ? ? ? Dental ? ?(+) Dental Advisory Given ?  ?Pulmonary ?sleep apnea , Current Smoker,  ?  ?breath sounds clear to auscultation ? ? ? ? ? ? Cardiovascular ?hypertension, Pt. on medications ? ?Rhythm:Regular Rate:Normal ? ? ?  ?Neuro/Psych ? Neuromuscular disease   ? GI/Hepatic ?Neg liver ROS, GERD  ,  ?Endo/Other  ?diabetes, Type 2, Oral Hypoglycemic Agents ? Renal/GU ?negative Renal ROS  ? ?  ?Musculoskeletal ? ?(+) Arthritis ,  ? Abdominal ?  ?Peds ? Hematology ?negative hematology ROS ?(+)   ?Anesthesia Other Findings ? ? Reproductive/Obstetrics ? ?  ? ? ? ? ? ? ? ? ? ? ? ? ? ?  ?  ? ? ? ? ? ? ? ?Lab Results  ?Component Value Date  ? WBC 12.4 (H) 06/10/2021  ? HGB 13.9 06/10/2021  ? HCT 43.5 06/10/2021  ? MCV 96.7 06/10/2021  ? PLT 471 (H) 06/10/2021  ? ?Lab Results  ?Component Value Date  ? CREATININE 0.79 06/10/2021  ? BUN 9 06/10/2021  ? NA 139 06/10/2021  ? K 4.3 06/10/2021  ? CL 102 06/10/2021  ? CO2 25 06/10/2021  ? ? ?Anesthesia Physical ?Anesthesia Plan ? ?ASA: 2 ? ?Anesthesia Plan: General  ? ?Post-op Pain Management: Gabapentin PO (pre-op)*, Tylenol PO (pre-op)*, Toradol IV (intra-op)* and Ketamine IV*  ? ?Induction: Intravenous ? ?PONV Risk Score and Plan: 2 and Dexamethasone, Ondansetron, Midazolam and Treatment may vary due to age or medical condition ? ?Airway Management Planned: Oral ETT ? ?Additional Equipment: None ? ?Intra-op Plan:  ? ?Post-operative Plan: Extubation in OR ? ?Informed Consent: I have reviewed the patients History and Physical, chart, labs and discussed the procedure including the risks, benefits and alternatives for the proposed anesthesia with the patient or authorized representative who has  indicated his/her understanding and acceptance.  ? ? ? ?Dental advisory given ? ?Plan Discussed with: CRNA ? ?Anesthesia Plan Comments:   ? ? ? ? ?Anesthesia Quick Evaluation ? ?

## 2021-06-10 NOTE — Progress Notes (Signed)
Anesthesia Chart Review: ? ? Case: [id number] Date/Time: 06/11/21 0715  ? Procedure: Lumbar fusion - L3-L4 - L4-L5 revision with removal of hardware and redo posterolateral instrumented fusion for pseudoarthrosis (Back)  ? Anesthesia type: General  ? Pre-op diagnosis: pseudoarthrosis  ? [facility] ROOM 21 / [facility] OR  ? Surgeons: Kary Kos, MD  ? ?  ? ? 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 ?VS: BP (!) 166/99   Pulse (!) 57   Temp 36.8 ?C (Oral)   Resp 18   Ht '5\' 11"'  (1.803 m)   Wt 117.3 kg   SpO2 100%   BMI 36.08 kg/m?  ? ?PROVIDERS: ?- PCP is Biagio Borg, MD ?- Cardiologist is Minus Breeding, MD who sees pt for HTN, prolonged QT, family hx early CAD. Last office visit 08/07/20. 1 year f/u recommended ? ? ?LABS: Labs reviewed: Acceptable for surgery. ?HbA1c result is pending ? ?(all labs ordered are listed, but only abnormal results are displayed) ? ?Labs Reviewed  ?GLUCOSE, CAPILLARY - Abnormal; Notable for the following components:  ?    Result Value  ? Glucose-Capillary 104 (*)   ? All other components within normal limits  ?CBC - Abnormal; Notable for the following components:  ? WBC 12.4 (*)   ? Platelets 471 (*)   ? All other components within normal limits  ?SURGICAL PCR SCREEN  ?BASIC METABOLIC PANEL  ?HEMOGLOBIN A1C  ?TYPE AND SCREEN  ? ? ? ?IMAGES: ?CXR 09/27/20:  ?- No active cardiopulmonary disease. ? ?EKG 09/27/20: Sinus bradycardia. Right axis deviation. Prolonged QT ? ? ?CV: none within last 5 years ? ? ? ?Past Medical History:  ?Diagnosis Date  ? Acute idiopathic pericarditis 04/08/2010  ? Qualifier: Diagnosis of  By: [name], [name]    ? Acute sinusitis 06/04/2014  ? Anxiety   ? CHF (congestive heart failure) ([place]) 06/29/2010  ? was pericardititis not CHF  ? Chronic back pain   ? Constipation   ? DDD (degenerative disc disease), lumbar 09/14/2012  ? Depression   ? Erectile dysfunction 09/14/2012  ?  GERD (gastroesophageal reflux disease)   ? ocassional  ? Gout, unspecified 05/01/2007  ? Qualifier: Diagnosis of  By: [name] MD, [name]   ? Headache   ? migraines in the past  ? History of pancreatitis   ? a. admx 04-2009.Marland KitchenMarland Kitchen? 2-2 triglycerides  ? Hypertension   ? Hypertriglyceridemia   ? a. followed by [facility]  ? Left sided sciatica 09/14/2012  ? Neuropathy   ? Obesity   ? OSA (obstructive sleep apnea)   ? CPAP- not current  ? Restless legs   ? Type 2 diabetes mellitus with diabetic neuropathy, unspecified ([place]) 09/20/2019  ? ? ?Past Surgical History:  ?Procedure Laterality Date  ? ANTERIOR CERVICAL DECOMP/DISCECTOMY FUSION N/A 09/14/2020  ? Procedure: Anterior Cervical Decompression Fusion - Cervical three-Cervical four;  Surgeon: Kary Kos, MD;  Location: [facility];  Service: Neurosurgery;  Laterality: N/A;  ? COLONOSCOPY    ? ELBOW SURGERY Left   ? KNEE SURGERY Right   ? x3 scopes  ? KNEE SURGERY Right   ? open menisicus repair  ? LUMBAR LAMINECTOMY/DECOMPRESSION MICRODISCECTOMY N/A 08/26/2015  ? Procedure: Lumbar Two-Sacral One  Laminectomy for decompression;  Surgeon: [name], MD;  Location: [facility];  Service: Neurosurgery;  Laterality: N/A;  ? pericardectomy  07/15/2010  ? Dr. [name]  ?  pleurx catheter placement  07/15/2010  ? Dr. [name]  ? SHOULDER SURGERY    ? right and left shoulders- Rotator cuff repair  ? TONSILLECTOMY AND ADENOIDECTOMY    ? one tonsil  ? ? ?MEDICATIONS: ? acetaminophen (TYLENOL) 500 MG tablet  ? amLODipine (NORVASC) 10 MG tablet  ? ascorbic acid (VITAMIN C) 500 MG tablet  ? aspirin EC 81 MG tablet  ? Blood Glucose Monitoring Suppl (ONE TOUCH ULTRA 2) w/Device KIT  ? Cholecalciferol (VITAMIN D3 PO)  ? citalopram (CELEXA) 40 MG tablet  ? diclofenac Sodium (VOLTAREN) 1 % GEL  ? docusate sodium (COLACE) 100 MG capsule  ? fenofibrate (TRICOR) 145 MG tablet  ? fluticasone (FLONASE) 50 MCG/ACT nasal spray  ? gabapentin (NEURONTIN) 300 MG capsule  ? glucose blood test  strip  ? hydrochlorothiazide (MICROZIDE) 12.5 MG capsule  ? ibuprofen (ADVIL) 800 MG tablet  ? Lancets MISC  ? lidocaine (LIDODERM) 5 %  ? lisinopril (ZESTRIL) 40 MG tablet  ? loratadine (CLARITIN) 10 MG tablet  ? magnesium oxide (MAG-OX) 400 MG tablet  ? metFORMIN (GLUCOPHAGE-XR) 500 MG 24 hr tablet  ? Multiple Minerals-Vitamins (CALCIUM-MAGNESIUM-ZINC-D3) TABS  ? oxyCODONE-acetaminophen (PERCOCET) 10-325 MG tablet  ? Potassium Chloride ER 20 MEQ TBCR  ? Pseudoephedrine HCl (SUDAFED PO)  ? rosuvastatin (CRESTOR) 20 MG tablet  ? sildenafil (VIAGRA) 100 MG tablet  ? tolterodine (DETROL LA) 4 MG 24 hr capsule  ? vitamin B-12 (CYANOCOBALAMIN) 1000 MCG tablet  ? zinc gluconate 50 MG tablet  ? ?No current facility-administered medications for this encounter.  ? ? ?If no changes, I anticipate pt can proceed with surgery as scheduled.  ? ?Willeen Cass, PhD, FNP-BC ?[facility]/Anesthesiology ?Phone: [phone] ?06/10/2021 4:13 PM ? ? ? ? ? ? ? ?

## 2021-06-10 NOTE — Progress Notes (Signed)
PCP - Dr. Cathlean Cower ?Cardiologist - Dr. [name] ? ?PPM/ICD - n/a ? ?Chest x-ray - n/a ?EKG - 09/29/20 ?Stress Test - 08/12/15 ?ECHO - 2011 ?Cardiac Cath -denies  ? ?Sleep Study - OSA+ ?CPAP - Uses BiPAP nightly ? ?Fasting Blood Sugar - 90-100 ?Checks Blood Sugar 2 times a week ?Last A1C-6.5 on 03/08/21, will collect A1C today. CBG at PAT 104. ? ?Blood Thinner Instructions: n/a ?Aspirin Instructions: Pt told to hold ASA 7 days pre-op. LD 06/03/21 ? ?NPO at MD ? ?COVID TEST- n/a ? ? ?Anesthesia review: Yes ? ?Patient denies shortness of breath, fever, cough and chest pain at PAT appointment ? ? ?All instructions explained to the patient, with a verbal understanding of the material. Patient agrees to go over the instructions while at home for a better understanding. Patient also instructed to self quarantine after being tested for COVID-19. The opportunity to ask questions was provided. ? ? ?

## 2021-06-11 ENCOUNTER — Inpatient Hospital Stay (HOSPITAL_COMMUNITY)
Admission: RE | Disposition: A | Discharge status: Home / Self Care | Payer: Self-pay | Source: Home / Self Care | Attending: Neurosurgery

## 2021-06-11 ENCOUNTER — Encounter (HOSPITAL_COMMUNITY): Payer: Self-pay | Admitting: Neurosurgery

## 2021-06-11 ENCOUNTER — Inpatient Hospital Stay (HOSPITAL_COMMUNITY): Payer: Medicare HMO

## 2021-06-11 ENCOUNTER — Inpatient Hospital Stay (HOSPITAL_COMMUNITY)
Admission: RE | Admit: 2021-06-11 | Discharge: 2021-06-12 | DRG: 460 | Disposition: A | Discharge status: Home / Self Care | Payer: Medicare HMO | Attending: Neurosurgery | Admitting: Neurosurgery

## 2021-06-11 ENCOUNTER — Inpatient Hospital Stay (HOSPITAL_COMMUNITY): Payer: Medicare HMO | Admitting: Emergency Medicine

## 2021-06-11 DIAGNOSIS — S32009K Unspecified fracture of unspecified lumbar vertebra, subsequent encounter for fracture with nonunion: Principal | ICD-10-CM | POA: Diagnosis present

## 2021-06-11 DIAGNOSIS — Z881 Allergy status to other antibiotic agents status: Secondary | ICD-10-CM

## 2021-06-11 DIAGNOSIS — F17210 Nicotine dependence, cigarettes, uncomplicated: Secondary | ICD-10-CM | POA: Diagnosis present

## 2021-06-11 DIAGNOSIS — Z7984 Long term (current) use of oral hypoglycemic drugs: Secondary | ICD-10-CM | POA: Diagnosis not present

## 2021-06-11 DIAGNOSIS — Z79899 Other long term (current) drug therapy: Secondary | ICD-10-CM

## 2021-06-11 DIAGNOSIS — Z91018 Allergy to other foods: Secondary | ICD-10-CM | POA: Diagnosis not present

## 2021-06-11 DIAGNOSIS — G4733 Obstructive sleep apnea (adult) (pediatric): Secondary | ICD-10-CM | POA: Diagnosis present

## 2021-06-11 DIAGNOSIS — L905 Scar conditions and fibrosis of skin: Secondary | ICD-10-CM | POA: Diagnosis present

## 2021-06-11 DIAGNOSIS — G2581 Restless legs syndrome: Secondary | ICD-10-CM | POA: Diagnosis present

## 2021-06-11 DIAGNOSIS — K219 Gastro-esophageal reflux disease without esophagitis: Secondary | ICD-10-CM | POA: Diagnosis present

## 2021-06-11 DIAGNOSIS — T84296A Other mechanical complication of internal fixation device of vertebrae, initial encounter: Secondary | ICD-10-CM | POA: Diagnosis not present

## 2021-06-11 DIAGNOSIS — T84226A Displacement of internal fixation device of vertebrae, initial encounter: Secondary | ICD-10-CM | POA: Diagnosis present

## 2021-06-11 DIAGNOSIS — F419 Anxiety disorder, unspecified: Secondary | ICD-10-CM | POA: Diagnosis present

## 2021-06-11 DIAGNOSIS — M109 Gout, unspecified: Secondary | ICD-10-CM | POA: Diagnosis present

## 2021-06-11 DIAGNOSIS — E1140 Type 2 diabetes mellitus with diabetic neuropathy, unspecified: Secondary | ICD-10-CM | POA: Diagnosis present

## 2021-06-11 DIAGNOSIS — Z833 Family history of diabetes mellitus: Secondary | ICD-10-CM | POA: Diagnosis not present

## 2021-06-11 DIAGNOSIS — E781 Pure hyperglyceridemia: Secondary | ICD-10-CM | POA: Diagnosis present

## 2021-06-11 DIAGNOSIS — M960 Pseudarthrosis after fusion or arthrodesis: Secondary | ICD-10-CM

## 2021-06-11 DIAGNOSIS — Z7982 Long term (current) use of aspirin: Secondary | ICD-10-CM | POA: Diagnosis not present

## 2021-06-11 DIAGNOSIS — Z419 Encounter for procedure for purposes other than remedying health state, unspecified: Secondary | ICD-10-CM

## 2021-06-11 DIAGNOSIS — Z6836 Body mass index (BMI) 36.0-36.9, adult: Secondary | ICD-10-CM

## 2021-06-11 DIAGNOSIS — E669 Obesity, unspecified: Secondary | ICD-10-CM | POA: Diagnosis present

## 2021-06-11 DIAGNOSIS — Z8249 Family history of ischemic heart disease and other diseases of the circulatory system: Secondary | ICD-10-CM

## 2021-06-11 DIAGNOSIS — Z888 Allergy status to other drugs, medicaments and biological substances status: Secondary | ICD-10-CM | POA: Diagnosis not present

## 2021-06-11 LAB — GLUCOSE, CAPILLARY
Glucose-Capillary: 116 mg/dL — ABNORMAL HIGH (ref 70–99)
Glucose-Capillary: 149 mg/dL — ABNORMAL HIGH (ref 70–99)
Glucose-Capillary: 203 mg/dL — ABNORMAL HIGH (ref 70–99)
Glucose-Capillary: 225 mg/dL — ABNORMAL HIGH (ref 70–99)

## 2021-06-11 LAB — HEMOGLOBIN A1C
Hgb A1c MFr Bld: 6.4 % — ABNORMAL HIGH (ref 4.8–5.6)
Mean Plasma Glucose: 137 mg/dL

## 2021-06-11 SURGERY — POSTERIOR LUMBAR FUSION 2 WITH HARDWARE REMOVAL
Anesthesia: General | Site: Back

## 2021-06-11 MED ORDER — MIDAZOLAM HCL 2 MG/2ML IJ SOLN
INTRAMUSCULAR | Status: AC
  Filled 2021-06-11: qty 2

## 2021-06-11 MED ORDER — ZINC GLUCONATE 50 MG PO TABS: 50.0000 mg | ORAL_TABLET | Freq: Every day | ORAL | Status: DC

## 2021-06-11 MED ORDER — VITAMIN B-12 1000 MCG PO TABS
1000.0000 ug | ORAL_TABLET | Freq: Every day | ORAL | Status: DC
  Administered 2021-06-12: 1000 ug via ORAL
  Filled 2021-06-11: qty 1

## 2021-06-11 MED ORDER — LORATADINE 10 MG PO TABS
10.0000 mg | ORAL_TABLET | Freq: Every day | ORAL | Status: DC
  Administered 2021-06-12: 10 mg via ORAL
  Filled 2021-06-11: qty 1

## 2021-06-11 MED ORDER — ACETAMINOPHEN 500 MG PO TABS: 500.0000 mg | ORAL_TABLET | Freq: Four times a day (QID) | ORAL | Status: DC | PRN

## 2021-06-11 MED ORDER — POTASSIUM CHLORIDE CRYS ER 20 MEQ PO TBCR
20.0000 meq | EXTENDED_RELEASE_TABLET | Freq: Every day | ORAL | Status: DC
  Administered 2021-06-12: 20 meq via ORAL
  Filled 2021-06-11: qty 1

## 2021-06-11 MED ORDER — CHLORHEXIDINE GLUCONATE CLOTH 2 % EX PADS: 6.0000 | MEDICATED_PAD | Freq: Once | CUTANEOUS | Status: DC

## 2021-06-11 MED ORDER — ORAL CARE MOUTH RINSE: 15.0000 mL | Freq: Once | OROMUCOSAL | Status: AC

## 2021-06-11 MED ORDER — ACETAMINOPHEN 650 MG RE SUPP: 650.0000 mg | RECTAL | Status: DC | PRN

## 2021-06-11 MED ORDER — CYCLOBENZAPRINE HCL 10 MG PO TABS
10.0000 mg | ORAL_TABLET | Freq: Three times a day (TID) | ORAL | Status: DC | PRN
  Administered 2021-06-12: 10 mg via ORAL
  Filled 2021-06-11 (×3): qty 1

## 2021-06-11 MED ORDER — INSULIN ASPART 100 UNIT/ML IJ SOLN
0.0000 [IU] | Freq: Three times a day (TID) | INTRAMUSCULAR | Status: DC
  Administered 2021-06-11: 5 [IU] via SUBCUTANEOUS

## 2021-06-11 MED ORDER — ROCURONIUM BROMIDE 10 MG/ML (PF) SYRINGE
PREFILLED_SYRINGE | INTRAVENOUS | Status: DC | PRN
Start: 2021-06-11 — End: 2021-06-11
  Administered 2021-06-11: 60 mg via INTRAVENOUS
  Administered 2021-06-11: 40 mg via INTRAVENOUS

## 2021-06-11 MED ORDER — ASCORBIC ACID 500 MG PO TABS
500.0000 mg | ORAL_TABLET | Freq: Every day | ORAL | Status: DC
  Administered 2021-06-12: 500 mg via ORAL
  Filled 2021-06-11: qty 1

## 2021-06-11 MED ORDER — LIDOCAINE-EPINEPHRINE 1 %-1:100000 IJ SOLN
INTRAMUSCULAR | Status: AC
  Filled 2021-06-11: qty 1

## 2021-06-11 MED ORDER — ACETAMINOPHEN 500 MG PO TABS
ORAL_TABLET | ORAL | Status: AC
  Administered 2021-06-11: 500 mg via ORAL
  Filled 2021-06-11: qty 2

## 2021-06-11 MED ORDER — MIDAZOLAM HCL 2 MG/2ML IJ SOLN
INTRAMUSCULAR | Status: DC | PRN
  Administered 2021-06-11: 2 mg via INTRAVENOUS

## 2021-06-11 MED ORDER — AMISULPRIDE (ANTIEMETIC) 5 MG/2ML IV SOLN: 10.0000 mg | Freq: Once | INTRAVENOUS | Status: DC | PRN

## 2021-06-11 MED ORDER — VITAMIN D3 25 MCG (1000 UNIT) PO TABS
1000.0000 [IU] | ORAL_TABLET | Freq: Every day | ORAL | Status: DC
Start: 2021-06-12 — End: 2021-06-12
  Administered 2021-06-12: 1000 [IU] via ORAL
  Filled 2021-06-11 (×2): qty 1

## 2021-06-11 MED ORDER — KETAMINE HCL 50 MG/5ML IJ SOSY
PREFILLED_SYRINGE | INTRAMUSCULAR | Status: AC
  Filled 2021-06-11: qty 5

## 2021-06-11 MED ORDER — CEFAZOLIN SODIUM-DEXTROSE 2-4 GM/100ML-% IV SOLN
2.0000 g | Freq: Three times a day (TID) | INTRAVENOUS | Status: AC
  Administered 2021-06-11 (×2): 2 g via INTRAVENOUS
  Filled 2021-06-11 (×2): qty 100

## 2021-06-11 MED ORDER — FENTANYL CITRATE (PF) 250 MCG/5ML IJ SOLN
INTRAMUSCULAR | Status: DC | PRN
  Administered 2021-06-11 (×2): 25 ug via INTRAVENOUS
  Administered 2021-06-11: 150 ug via INTRAVENOUS

## 2021-06-11 MED ORDER — HYDROCHLOROTHIAZIDE 12.5 MG PO CAPS
12.5000 mg | ORAL_CAPSULE | Freq: Every day | ORAL | Status: DC
  Administered 2021-06-12: 12.5 mg via ORAL
  Filled 2021-06-11: qty 1

## 2021-06-11 MED ORDER — LIDOCAINE 2% (20 MG/ML) 5 ML SYRINGE
INTRAMUSCULAR | Status: DC | PRN
  Administered 2021-06-11: 80 mg via INTRAVENOUS

## 2021-06-11 MED ORDER — CHLORHEXIDINE GLUCONATE 0.12 % MT SOLN: 15.0000 mL | Freq: Once | OROMUCOSAL | Status: AC

## 2021-06-11 MED ORDER — PHENYLEPHRINE HCL-NACL 20-0.9 MG/250ML-% IV SOLN
INTRAVENOUS | Status: DC | PRN
  Administered 2021-06-11: 30 ug/min via INTRAVENOUS

## 2021-06-11 MED ORDER — PANTOPRAZOLE SODIUM 40 MG IV SOLR
40.0000 mg | Freq: Every day | INTRAVENOUS | Status: DC
  Administered 2021-06-11: 40 mg via INTRAVENOUS
  Filled 2021-06-11: qty 10

## 2021-06-11 MED ORDER — FESOTERODINE FUMARATE ER 4 MG PO TB24
4.0000 mg | ORAL_TABLET | Freq: Every day | ORAL | Status: DC
  Administered 2021-06-12: 4 mg via ORAL
  Filled 2021-06-11: qty 1

## 2021-06-11 MED ORDER — LIDOCAINE-EPINEPHRINE 1 %-1:100000 IJ SOLN
INTRAMUSCULAR | Status: DC | PRN
  Administered 2021-06-11: 10 mL

## 2021-06-11 MED ORDER — PHENYLEPHRINE 40 MCG/ML (10ML) SYRINGE FOR IV PUSH (FOR BLOOD PRESSURE SUPPORT)
PREFILLED_SYRINGE | INTRAVENOUS | Status: DC | PRN
  Administered 2021-06-11 (×2): 80 ug via INTRAVENOUS

## 2021-06-11 MED ORDER — ACETAMINOPHEN 500 MG PO TABS: 1000.0000 mg | ORAL_TABLET | Freq: Once | ORAL | Status: AC

## 2021-06-11 MED ORDER — ONDANSETRON HCL 4 MG PO TABS: 4.0000 mg | ORAL_TABLET | Freq: Four times a day (QID) | ORAL | Status: DC | PRN

## 2021-06-11 MED ORDER — SODIUM CHLORIDE 0.9% FLUSH
3.0000 mL | Freq: Two times a day (BID) | INTRAVENOUS | Status: DC
  Administered 2021-06-11 (×2): 3 mL via INTRAVENOUS

## 2021-06-11 MED ORDER — LACTATED RINGERS IV SOLN: INTRAVENOUS | Status: DC

## 2021-06-11 MED ORDER — HYDROMORPHONE HCL 1 MG/ML IJ SOLN
INTRAMUSCULAR | Status: AC
  Filled 2021-06-11: qty 1

## 2021-06-11 MED ORDER — IBUPROFEN 800 MG PO TABS: 800.0000 mg | ORAL_TABLET | Freq: Three times a day (TID) | ORAL | Status: DC | PRN

## 2021-06-11 MED ORDER — ONDANSETRON HCL 4 MG/2ML IJ SOLN: 4.0000 mg | Freq: Four times a day (QID) | INTRAMUSCULAR | Status: DC | PRN

## 2021-06-11 MED ORDER — LIDOCAINE 5 % EX PTCH
1.0000 | MEDICATED_PATCH | Freq: Every day | CUTANEOUS | Status: DC | PRN
  Administered 2021-06-12: 1 via TRANSDERMAL
  Filled 2021-06-11 (×2): qty 1

## 2021-06-11 MED ORDER — ASPIRIN EC 81 MG PO TBEC
81.0000 mg | DELAYED_RELEASE_TABLET | Freq: Every day | ORAL | Status: DC
  Administered 2021-06-11 – 2021-06-12 (×2): 81 mg via ORAL
  Filled 2021-06-11 (×2): qty 1

## 2021-06-11 MED ORDER — ALUM & MAG HYDROXIDE-SIMETH 200-200-20 MG/5ML PO SUSP: 30.0000 mL | Freq: Four times a day (QID) | ORAL | Status: DC | PRN

## 2021-06-11 MED ORDER — INSULIN ASPART 100 UNIT/ML IJ SOLN: 0.0000 [IU] | INTRAMUSCULAR | Status: DC | PRN

## 2021-06-11 MED ORDER — AMLODIPINE BESYLATE 5 MG PO TABS
10.0000 mg | ORAL_TABLET | Freq: Every day | ORAL | Status: DC
  Administered 2021-06-12: 10 mg via ORAL
  Filled 2021-06-11: qty 2

## 2021-06-11 MED ORDER — CITALOPRAM HYDROBROMIDE 20 MG PO TABS
40.0000 mg | ORAL_TABLET | Freq: Every day | ORAL | Status: DC
  Administered 2021-06-12: 40 mg via ORAL
  Filled 2021-06-11: qty 2

## 2021-06-11 MED ORDER — ONDANSETRON HCL 4 MG/2ML IJ SOLN
INTRAMUSCULAR | Status: DC | PRN
Start: 2021-06-11 — End: 2021-06-11
  Administered 2021-06-11: 4 mg via INTRAVENOUS

## 2021-06-11 MED ORDER — ACETAMINOPHEN 325 MG PO TABS
650.0000 mg | ORAL_TABLET | ORAL | Status: DC | PRN
  Administered 2021-06-12: 650 mg via ORAL
  Filled 2021-06-11: qty 2

## 2021-06-11 MED ORDER — SODIUM CHLORIDE 0.9 % IV SOLN: 250.0000 mL | INTRAVENOUS | Status: DC

## 2021-06-11 MED ORDER — HYDROMORPHONE HCL 1 MG/ML IJ SOLN
0.2500 mg | INTRAMUSCULAR | Status: DC | PRN
  Administered 2021-06-11 (×2): 0.5 mg via INTRAVENOUS

## 2021-06-11 MED ORDER — OXYCODONE-ACETAMINOPHEN 5-325 MG PO TABS: 1.0000 | ORAL_TABLET | ORAL | Status: DC | PRN

## 2021-06-11 MED ORDER — THROMBIN 20000 UNITS EX SOLR
CUTANEOUS | Status: AC
  Filled 2021-06-11: qty 20000

## 2021-06-11 MED ORDER — THROMBIN 20000 UNITS EX SOLR: CUTANEOUS | Status: DC | PRN

## 2021-06-11 MED ORDER — PHENOL 1.4 % MT LIQD: 1.0000 | OROMUCOSAL | Status: DC | PRN

## 2021-06-11 MED ORDER — FENOFIBRATE 54 MG PO TABS
54.0000 mg | ORAL_TABLET | Freq: Every day | ORAL | Status: DC
  Administered 2021-06-12: 54 mg via ORAL
  Filled 2021-06-11: qty 1

## 2021-06-11 MED ORDER — KETAMINE HCL 10 MG/ML IJ SOLN
INTRAMUSCULAR | Status: DC | PRN
Start: 2021-06-11 — End: 2021-06-11
  Administered 2021-06-11: 20 mg via INTRAVENOUS
  Administered 2021-06-11 (×2): 10 mg via INTRAVENOUS

## 2021-06-11 MED ORDER — INSULIN ASPART 100 UNIT/ML IJ SOLN
0.0000 [IU] | Freq: Every day | INTRAMUSCULAR | Status: DC
  Administered 2021-06-11: 2 [IU] via SUBCUTANEOUS

## 2021-06-11 MED ORDER — HYDROMORPHONE HCL 1 MG/ML IJ SOLN
0.5000 mg | INTRAMUSCULAR | Status: DC | PRN
  Administered 2021-06-11: 0.5 mg via INTRAVENOUS
  Filled 2021-06-11: qty 0.5

## 2021-06-11 MED ORDER — DEXAMETHASONE SODIUM PHOSPHATE 10 MG/ML IJ SOLN
INTRAMUSCULAR | Status: DC | PRN
  Administered 2021-06-11: 10 mg via INTRAVENOUS

## 2021-06-11 MED ORDER — ROSUVASTATIN CALCIUM 20 MG PO TABS
20.0000 mg | ORAL_TABLET | Freq: Every day | ORAL | Status: DC
Start: 2021-06-12 — End: 2021-06-12
  Administered 2021-06-12: 20 mg via ORAL
  Filled 2021-06-11: qty 1

## 2021-06-11 MED ORDER — FENTANYL CITRATE (PF) 250 MCG/5ML IJ SOLN
INTRAMUSCULAR | Status: AC
Start: 2021-06-11 — End: ?
  Filled 2021-06-11: qty 5

## 2021-06-11 MED ORDER — SODIUM CHLORIDE 0.9% FLUSH: 3.0000 mL | INTRAVENOUS | Status: DC | PRN

## 2021-06-11 MED ORDER — CHLORHEXIDINE GLUCONATE CLOTH 2 % EX PADS
6.0000 | MEDICATED_PAD | Freq: Once | CUTANEOUS | Status: DC
Start: 2021-06-11 — End: 2021-06-11

## 2021-06-11 MED ORDER — BUPIVACAINE LIPOSOME 1.3 % IJ SUSP
INTRAMUSCULAR | Status: DC | PRN
Start: 2021-06-11 — End: 2021-06-11
  Administered 2021-06-11: 20 mL

## 2021-06-11 MED ORDER — PROPOFOL 10 MG/ML IV BOLUS
INTRAVENOUS | Status: AC
  Filled 2021-06-11: qty 20

## 2021-06-11 MED ORDER — BUPIVACAINE LIPOSOME 1.3 % IJ SUSP
INTRAMUSCULAR | Status: AC
  Filled 2021-06-11: qty 20

## 2021-06-11 MED ORDER — CEFAZOLIN SODIUM-DEXTROSE 2-4 GM/100ML-% IV SOLN
INTRAVENOUS | Status: AC
  Filled 2021-06-11: qty 100

## 2021-06-11 MED ORDER — CHLORHEXIDINE GLUCONATE 0.12 % MT SOLN
OROMUCOSAL | Status: AC
  Administered 2021-06-11: 15 mL via OROMUCOSAL
  Filled 2021-06-11: qty 15

## 2021-06-11 MED ORDER — SILDENAFIL CITRATE 100 MG PO TABS: 50.0000 mg | ORAL_TABLET | Freq: Every day | ORAL | Status: DC | PRN

## 2021-06-11 MED ORDER — SUGAMMADEX SODIUM 200 MG/2ML IV SOLN
INTRAVENOUS | Status: DC | PRN
Start: 2021-06-11 — End: 2021-06-11
  Administered 2021-06-11 (×2): 100 mg via INTRAVENOUS

## 2021-06-11 MED ORDER — OXYCODONE HCL 5 MG PO TABS
10.0000 mg | ORAL_TABLET | ORAL | Status: DC | PRN
  Administered 2021-06-11 – 2021-06-12 (×5): 10 mg via ORAL
  Filled 2021-06-11 (×5): qty 2

## 2021-06-11 MED ORDER — CALCIUM-MAGNESIUM-ZINC-D3 PO TABS: 1.0000 | ORAL_TABLET | Freq: Every day | ORAL | Status: DC

## 2021-06-11 MED ORDER — EPHEDRINE SULFATE-NACL 50-0.9 MG/10ML-% IV SOSY
PREFILLED_SYRINGE | INTRAVENOUS | Status: DC | PRN
Start: 2021-06-11 — End: 2021-06-11
  Administered 2021-06-11 (×2): 5 mg via INTRAVENOUS
  Administered 2021-06-11: 10 mg via INTRAVENOUS

## 2021-06-11 MED ORDER — 0.9 % SODIUM CHLORIDE (POUR BTL) OPTIME
TOPICAL | Status: DC | PRN
  Administered 2021-06-11 (×3): 1000 mL

## 2021-06-11 MED ORDER — GABAPENTIN 300 MG PO CAPS
900.0000 mg | ORAL_CAPSULE | Freq: Three times a day (TID) | ORAL | Status: DC
  Administered 2021-06-11 – 2021-06-12 (×3): 900 mg via ORAL
  Filled 2021-06-11 (×3): qty 3

## 2021-06-11 MED ORDER — MENTHOL 3 MG MT LOZG: 1.0000 | LOZENGE | OROMUCOSAL | Status: DC | PRN

## 2021-06-11 MED ORDER — LISINOPRIL 20 MG PO TABS
40.0000 mg | ORAL_TABLET | Freq: Every day | ORAL | Status: DC
  Administered 2021-06-12: 40 mg via ORAL
  Filled 2021-06-11: qty 2

## 2021-06-11 MED ORDER — GABAPENTIN 300 MG PO CAPS: 300.0000 mg | ORAL_CAPSULE | Freq: Once | ORAL | Status: DC

## 2021-06-11 MED ORDER — MAGNESIUM OXIDE 400 MG PO TABS: 400.0000 mg | ORAL_TABLET | Freq: Every day | ORAL | Status: DC

## 2021-06-11 MED ORDER — DOCUSATE SODIUM 100 MG PO CAPS: 100.0000 mg | ORAL_CAPSULE | Freq: Every day | ORAL | Status: DC | PRN

## 2021-06-11 MED ORDER — PROPOFOL 10 MG/ML IV BOLUS
INTRAVENOUS | Status: DC | PRN
  Administered 2021-06-11: 200 mg via INTRAVENOUS

## 2021-06-11 MED ORDER — METFORMIN HCL ER 500 MG PO TB24
1000.0000 mg | ORAL_TABLET | Freq: Every day | ORAL | Status: DC
  Administered 2021-06-12: 1000 mg via ORAL
  Filled 2021-06-11: qty 2

## 2021-06-11 MED ORDER — CEFAZOLIN SODIUM-DEXTROSE 2-4 GM/100ML-% IV SOLN
2.0000 g | INTRAVENOUS | Status: AC
  Administered 2021-06-11: 2 g via INTRAVENOUS

## 2021-06-11 MED ORDER — FLUTICASONE PROPIONATE 50 MCG/ACT NA SUSP
2.0000 | Freq: Every day | NASAL | Status: DC
  Administered 2021-06-12: 2 via NASAL
  Filled 2021-06-11: qty 16

## 2021-06-11 SURGICAL SUPPLY — 81 items
APL SKNCLS STERI-STRIP NONHPOA (GAUZE/BANDAGES/DRESSINGS) ×1
BAG COUNTER SPONGE SURGICOUNT (BAG) ×3 IMPLANT
BASKET BONE COLLECTION (BASKET) ×2 IMPLANT
BENZOIN TINCTURE PRP APPL 2/3 (GAUZE/BANDAGES/DRESSINGS) ×2 IMPLANT
BLADE CLIPPER SURG (BLADE) IMPLANT
BLADE SURG 11 STRL SS (BLADE) ×2 IMPLANT
BONE VIVIGEN FORMABLE 10CC (Bone Implant) ×2 IMPLANT
BUR CUTTER 7.0 ROUND (BURR) ×2 IMPLANT
BUR MATCHSTICK NEURO 3.0 LAGG (BURR) ×2 IMPLANT
CANISTER SUCT 3000ML PPV (MISCELLANEOUS) ×2 IMPLANT
CAP LOCKING THREADED (Cap) ×6 IMPLANT
CARTRIDGE OIL MAESTRO DRILL (MISCELLANEOUS) ×1 IMPLANT
CNTNR URN SCR LID CUP LEK RST (MISCELLANEOUS) ×1 IMPLANT
CONT SPEC 4OZ STRL OR WHT (MISCELLANEOUS) ×2
COVER BACK TABLE 24X17X13 BIG (DRAPES) IMPLANT
COVER BACK TABLE 60X90IN (DRAPES) ×2 IMPLANT
DECANTER SPIKE VIAL GLASS SM (MISCELLANEOUS) ×2 IMPLANT
DERMABOND ADVANCED (GAUZE/BANDAGES/DRESSINGS) ×1
DERMABOND ADVANCED .7 DNX12 (GAUZE/BANDAGES/DRESSINGS) ×1 IMPLANT
DIFFUSER DRILL AIR PNEUMATIC (MISCELLANEOUS) ×2 IMPLANT
DRAPE C-ARM 42X72 X-RAY (DRAPES) ×3 IMPLANT
DRAPE C-ARMOR (DRAPES) IMPLANT
DRAPE HALF SHEET 40X57 (DRAPES) ×1 IMPLANT
DRAPE LAPAROTOMY 100X72X124 (DRAPES) ×2 IMPLANT
DRAPE SURG 17X23 STRL (DRAPES) ×2 IMPLANT
DRSG OPSITE 4X5.5 SM (GAUZE/BANDAGES/DRESSINGS) ×2 IMPLANT
DRSG OPSITE POSTOP 4X6 (GAUZE/BANDAGES/DRESSINGS) ×1 IMPLANT
DRSG OPSITE POSTOP 4X8 (GAUZE/BANDAGES/DRESSINGS) ×1 IMPLANT
DURAPREP 26ML APPLICATOR (WOUND CARE) ×2 IMPLANT
ELECT BLADE 4.0 EZ CLEAN MEGAD (MISCELLANEOUS) ×2
ELECT REM PT RETURN 9FT ADLT (ELECTROSURGICAL) ×2
ELECTRODE BLDE 4.0 EZ CLN MEGD (MISCELLANEOUS) IMPLANT
ELECTRODE REM PT RTRN 9FT ADLT (ELECTROSURGICAL) ×1 IMPLANT
EVACUATOR 3/16  PVC DRAIN (DRAIN) ×2
EVACUATOR 3/16 PVC DRAIN (DRAIN) ×1 IMPLANT
GAUZE 4X4 16PLY ~~LOC~~+RFID DBL (SPONGE) ×4 IMPLANT
GAUZE SPONGE 4X4 12PLY STRL (GAUZE/BANDAGES/DRESSINGS) ×2 IMPLANT
GAUZE SPONGE 4X4 16PLY XRAY LF (GAUZE/BANDAGES/DRESSINGS) ×2 IMPLANT
GLOVE EXAM NITRILE XL STR (GLOVE) IMPLANT
GLOVE SURG ENC MOIS LTX SZ7 (GLOVE) ×2 IMPLANT
GLOVE SURG ENC MOIS LTX SZ8 (GLOVE) ×4 IMPLANT
GLOVE SURG UNDER POLY LF SZ7 (GLOVE) ×2 IMPLANT
GLOVE SURG UNDER POLY LF SZ8.5 (GLOVE) ×4 IMPLANT
GOWN STRL REUS W/ TWL LRG LVL3 (GOWN DISPOSABLE) IMPLANT
GOWN STRL REUS W/ TWL XL LVL3 (GOWN DISPOSABLE) ×2 IMPLANT
GOWN STRL REUS W/TWL 2XL LVL3 (GOWN DISPOSABLE) IMPLANT
GOWN STRL REUS W/TWL LRG LVL3 (GOWN DISPOSABLE) ×6
GOWN STRL REUS W/TWL XL LVL3 (GOWN DISPOSABLE) ×4
GRAFT BN 5X1XSPNE CVD POST DBM (Bone Implant) IMPLANT
GRAFT BNE MATRIX VG FRMBL L 10 (Bone Implant) IMPLANT
GRAFT BONE MAGNIFUSE 1X5CM (Bone Implant) ×4 IMPLANT
KIT BASIN OR (CUSTOM PROCEDURE TRAY) ×2 IMPLANT
KIT GRAFTMAG DEL NEURO DISP (NEUROSURGERY SUPPLIES) IMPLANT
KIT INFUSE SMALL (Orthopedic Implant) ×1 IMPLANT
KIT POSITION SURG JACKSON T1 (MISCELLANEOUS) ×1 IMPLANT
KIT TURNOVER KIT B (KITS) ×2 IMPLANT
MILL MEDIUM DISP (BLADE) ×2 IMPLANT
NDL HYPO 21X1.5 SAFETY (NEEDLE) ×1 IMPLANT
NDL HYPO 25X1 1.5 SAFETY (NEEDLE) ×1 IMPLANT
NEEDLE HYPO 21X1.5 SAFETY (NEEDLE) ×2 IMPLANT
NEEDLE HYPO 25X1 1.5 SAFETY (NEEDLE) ×2 IMPLANT
NS IRRIG 1000ML POUR BTL (IV SOLUTION) ×2 IMPLANT
OIL CARTRIDGE MAESTRO DRILL (MISCELLANEOUS) ×2
PACK LAMINECTOMY NEURO (CUSTOM PROCEDURE TRAY) ×2 IMPLANT
PAD ARMBOARD 7.5X6 YLW CONV (MISCELLANEOUS) ×6 IMPLANT
PATTIES SURGICAL 1X1 (DISPOSABLE) ×1 IMPLANT
ROD 85MM SPINAL (Rod) ×2 IMPLANT
SCREW CREO MODULAR 8.5X45 (Screw) ×3 IMPLANT
SCREW PA THRD CREO TULIP 5.5X4 (Head) ×3 IMPLANT
SPONGE SURGIFOAM ABS GEL 100 (HEMOSTASIS) ×2 IMPLANT
SPONGE T-LAP 4X18 ~~LOC~~+RFID (SPONGE) ×1 IMPLANT
STRIP CLOSURE SKIN 1/2X4 (GAUZE/BANDAGES/DRESSINGS) ×3 IMPLANT
SUT VIC AB 0 CT1 18XCR BRD8 (SUTURE) ×1 IMPLANT
SUT VIC AB 0 CT1 8-18 (SUTURE) ×4
SUT VIC AB 2-0 CT1 18 (SUTURE) ×3 IMPLANT
SUT VIC AB 4-0 PS2 27 (SUTURE) ×2 IMPLANT
SYR 20ML LL LF (SYRINGE) ×2 IMPLANT
TOWEL GREEN STERILE (TOWEL DISPOSABLE) ×2 IMPLANT
TOWEL GREEN STERILE FF (TOWEL DISPOSABLE) ×2 IMPLANT
TRAY FOLEY MTR SLVR 16FR STAT (SET/KITS/TRAYS/PACK) ×2 IMPLANT
WATER STERILE IRR 1000ML POUR (IV SOLUTION) ×2 IMPLANT

## 2021-06-11 NOTE — Transfer of Care (Signed)
Immediate Anesthesia Transfer of Care Note ? ?Patient: Harold [name] ? ?Procedure(s) Performed: Lumbar fusion - L3-L4 - L4-L5 revision with removal of hardware and redo posterolateral instrumented fusion for pseudoarthrosis (Back) ? ?Patient Location: PACU ? ?Anesthesia Type:General ? ?Level of Consciousness: awake, alert , oriented and drowsy ? ?Airway & Oxygen Therapy: Patient Spontanous Breathing and Patient connected to face mask oxygen ? ?Post-op Assessment: Report given to RN and Post -op Vital signs reviewed and stable ? ?Post vital signs: Reviewed and stable ? ?Last Vitals:  ?Vitals Value Taken Time  ?BP 161/89 06/11/21 1025  ?Temp    ?Pulse 72 06/11/21 1028  ?Resp 23 06/11/21 1028  ?SpO2 96 % 06/11/21 1028  ?Vitals shown include unvalidated device data. ? ?Last Pain: There were no vitals filed for this visit.   ? ?  ? ?Complications: No notable events documented. ?

## 2021-06-11 NOTE — Op Note (Signed)
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 ?Postoperative diagnosis: Same ? ?Procedure: Exploration of fusion removal of hardware L3-L5 with removal of nuts or rods left side L3 screw and bilateral L5 screws. ? ?2.  Replacement of left L3 and bilateral L5 screws with globus Creo amp modular 8.5 x 45 mm screws ? ?3.  Posterior lateral arthrodesis L3-L5 utilizing BMP, Vivigen, Magnifuse ? ?Surgeon: Dominica Severin Jezabelle Chisolm ? ?Assistant: Nash Shearer ? ?Anesthesia: General ? ?EBL: Minimal ? ?HPI: 56 year old gentleman longstanding issues with his back underwent an L3-L5 interbody fusion did fairly well for a while but over the last several months has had progressive worsening back and bilateral hip pain work-up has revealed pseudoarthrosis at L4-5 with what appeared to be ultimate solid fusion at L3-4.  There was loosening of his left L3 screw and loosening of bilateral L5 screws with haloing around CT scan.  So due to patient progression of clinical syndrome imaging findings and failed conservative treatment I recommended reexploration fusion removal of hardware and redo posterior lateral arthrodesis with replacement and revision of loose screws.  I extensively went over the risks and benefits of that operation with him as well as perioperative course expectations of outcome and alternatives of surgery and he understood and agreed to proceed forward. ? ?Operative procedure: Patient was brought into the OR was induced general anesthesia positioned prone Wilson frame his back was prepped and draped in routine sterile fashion.  His old incision was opened up the scar tissue was dissected free and dissection was carried down to the hardware in the construct bilaterally all the screws and rods were exposed I then disconnected the knots just contacted the rods the left L3 screw was clearly loose this was removed the right L3 screw did appear still to be solid.  I also removed 2 very loose L5 screws.  I then exposed the TPs at L3-L4  and L5 as well as the superior aspect of the 5 1 facet joint.  Then I aggressively decorticated the TPs at L3, L4, and L5 lay down BMP and Vivigen along the TPs posterior laterally at L3-4 and L4-5 then under fluoroscopy I replaced the screws utilizing the existing holes which were in good position with a large pedicle receive screws 2 sizes larger than the original.  These were all placed under fluoroscopy all screws had excellent purchase.  Then assembled the heads and laid the Magnifuse then on top of the BMP and Vivigen.  Then contoured the rods and anchored everything in place and torqued and tightened everything down.  Then the wound was copiously irrigated meticulous hemostasis was maintained a large Hemovac drain was placed and the wound was then closed in layers with interrupted Vicryl and the fascia Exparel was then injected and then interrupted Vicryl in the subcutaneous tissue and the skin.  Wound was dressed with Dermabond benzoin Steri-Strips and a sterile dressing in place and went to cover him in stable condition.  At the end the case all needle count sponge counts were correct. ?

## 2021-06-11 NOTE — Anesthesia Procedure Notes (Signed)
Procedure Name: Intubation ?Date/Time: 06/11/2021 7:45 AM ?Performed by: Trinna Post., CRNA ?Pre-anesthesia Checklist: Patient identified, Emergency Drugs available, Suction available, Patient being monitored and Timeout performed ?Patient Re-evaluated:Patient Re-evaluated prior to induction ?Oxygen Delivery Method: Circle system utilized ?Preoxygenation: Pre-oxygenation with 100% oxygen ?Induction Type: IV induction ?Ventilation: Mask ventilation without difficulty and Oral airway inserted - appropriate to patient size ?Laryngoscope Size: Mac and 4 ?Grade View: Grade I ?Tube type: Oral ?Tube size: 7.5 mm ?Number of attempts: 1 ?Airway Equipment and Method: Stylet ?Placement Confirmation: ETT inserted through vocal cords under direct vision, positive ETCO2 and breath sounds checked- equal and bilateral ?Secured at: 23 cm ?Tube secured with: Tape ?Dental Injury: Teeth and Oropharynx as per pre-operative assessment  ? ? ? ? ?

## 2021-06-11 NOTE — Progress Notes (Signed)
Patient placed on CPAP for HS using hospital machine and patient mask from home.  Room air, auto-titration mode used. Patient is able to self administer and is familiar with equipment and procedure.  ?

## 2021-06-11 NOTE — Anesthesia Postprocedure Evaluation (Signed)
Anesthesia Post Note ? ?Patient: Harold [name] ? ?Procedure(s) Performed: Lumbar fusion - L3-L4 - L4-L5 revision with removal of hardware and redo posterolateral instrumented fusion for pseudoarthrosis (Back) ? ?  ? ?Patient location during evaluation: PACU ?Anesthesia Type: General ?Level of consciousness: awake and alert ?Pain management: pain level controlled ?Vital Signs Assessment: post-procedure vital signs reviewed and stable ?Respiratory status: spontaneous breathing, nonlabored ventilation, respiratory function stable and patient connected to nasal cannula oxygen ?Cardiovascular status: blood pressure returned to baseline and stable ?Postop Assessment: no apparent nausea or vomiting ?Anesthetic complications: no ? ? ?No notable events documented. ? ?Last Vitals:  ?Vitals:  ? 06/11/21 1125 06/11/21 1155  ?BP: (!) 157/92 (!) 150/88  ?Pulse: 73 76  ?Resp: 19 18  ?Temp: 36.7 ?C 36.8 ?C  ?SpO2: 98% 99%  ?  ?Last Pain:  ?Vitals:  ? 06/11/21 1155  ?TempSrc: Oral  ?PainSc: 3   ? ? ?  ?  ?  ?  ?  ?  ? ?Suzette Battiest E ? ? ? ? ?

## 2021-06-11 NOTE — H&P (Signed)
Harold [name] is an 56 y.o. male.   Chief Complaint: Back pain HPI: 56 year old gentleman previously undergone decompressive laminectomy interbody fusions from L3-L5 and did very well initially however over the last several months has had progressive worsening back and bilateral hip pain.  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  Due to patient's progression of clinical syndrome imaging findings and failed conservative treatment I recommended reexploration fusion removal of hardware replacement of loose screws and redo posterior lateral arthrodesis.  I have extensively gone over the risks and benefits of that operation with him as well as perioperative course expectations of outcome and alternatives of surgery and he understands and agrees to proceed forward.  Past Medical History:  Diagnosis Date   Acute idiopathic pericarditis 04/08/2010   Qualifier: Diagnosis of  By: Huntley Dec, [name]     Acute sinusitis 06/04/2014   Anxiety    CHF (congestive heart failure) (HCC) 06/29/2010   was pericardititis not CHF   Chronic back pain    Constipation    DDD (degenerative disc disease), lumbar 09/14/2012   Depression    Erectile dysfunction 09/14/2012   GERD (gastroesophageal reflux disease)    ocassional   Gout, unspecified 05/01/2007   Qualifier: Diagnosis of  By: Tawanna Cooler MD, Tinnie Gens A    Headache    migraines in the past   History of pancreatitis    a. admx 04-2009.Marland KitchenMarland Kitchen? 2-2 triglycerides   Hypertension    Hypertriglyceridemia    a. followed by [facility]   Left sided sciatica 09/14/2012   Neuropathy    Obesity    OSA (obstructive sleep apnea)    CPAP- not current   Restless legs    Type 2 diabetes mellitus with diabetic neuropathy, unspecified (HCC) 09/20/2019    Past Surgical History:  Procedure Laterality Date   ANTERIOR CERVICAL DECOMP/DISCECTOMY FUSION N/A 09/14/2020   Procedure: Anterior Cervical Decompression Fusion -  Cervical three-Cervical four;  Surgeon: Donalee Citrin, MD;  Location: [facility];  Service: Neurosurgery;  Laterality: N/A;   COLONOSCOPY     ELBOW SURGERY Left    KNEE SURGERY Right    x3 scopes   KNEE SURGERY Right    open menisicus repair   LUMBAR LAMINECTOMY/DECOMPRESSION MICRODISCECTOMY N/A 08/26/2015   Procedure: Lumbar Two-Sacral One  Laminectomy for decompression;  Surgeon: Loura Halt Ditty, MD;  Location: [facility];  Service: Neurosurgery;  Laterality: N/A;   pericardectomy  07/15/2010   Dr. Tyrone Sage   pleurx catheter placement  07/15/2010   Dr. Tyrone Sage   SHOULDER SURGERY     right and left shoulders- Rotator cuff repair   TONSILLECTOMY AND ADENOIDECTOMY     one tonsil    Family History  Problem Relation Age of Onset   Diabetes Mother    Diabetes Father    Colon cancer Father 4   Heart disease Father 96       CABG   Pancreatitis Father    Hypertension Other        entire family   Liver cancer Paternal Uncle    Diabetes Sister    Stomach cancer Maternal Grandfather    Pancreatitis Paternal Uncle        x 2   Esophageal cancer Neg Hx    Rectal cancer Neg Hx    Colon polyps Neg Hx    Social History:  reports that he has been smoking cigarettes. He has a 11.88 pack-year smoking history. His  smokeless tobacco use includes snuff. He reports current alcohol use. He reports that he does not use drugs.  Allergies:  Allergies  Allergen Reactions   Other Itching, Swelling and Palpitations    Pecans: itching and swelling of the tongue    Diphenhydramine Itching, Palpitations and Other (See Comments)    "jittery" Pt states he can take Benadryl in an emergency situation   Pecan Nut (Diagnostic)    Colchicine Diarrhea   Lipitor [Atorvastatin] Other (See Comments)    Muscle cramps   Septra [Sulfamethoxazole-Trimethoprim] Itching    Medications Prior to Admission  Medication Sig Dispense Refill   amLODipine (NORVASC) 10 MG tablet TAKE 1 TABLET(10 MG) BY MOUTH DAILY  (Patient taking differently: Take 10 mg by mouth daily.) 30 tablet 0   ascorbic acid (VITAMIN C) 500 MG tablet Take 500 mg by mouth daily.     aspirin EC 81 MG tablet Take 81 mg by mouth daily.     Cholecalciferol (VITAMIN D3 PO) Take 1 capsule by mouth daily.     citalopram (CELEXA) 40 MG tablet TAKE 1 TABLET(40 MG) BY MOUTH DAILY (Patient taking differently: Take 40 mg by mouth daily.) 90 tablet 1   diclofenac Sodium (VOLTAREN) 1 % GEL Apply 1 application topically 3 (three) times daily as needed (PAIN). 150 g 0   docusate sodium (COLACE) 100 MG capsule Take 1 capsule (100 mg total) by mouth 2 (two) times daily. Purchase over the counter (Patient taking differently: Take 100 mg by mouth daily as needed for mild constipation. Purchase over the counter) 60 capsule 0   fenofibrate (TRICOR) 145 MG tablet TAKE 1 TABLET(145 MG) BY MOUTH DAILY (Patient taking differently: Take 145 mg by mouth daily.) 90 tablet 1   fluticasone (FLONASE) 50 MCG/ACT nasal spray Place 2 sprays into both nostrils daily. 16 g 6   gabapentin (NEURONTIN) 300 MG capsule Take 900 mg by mouth 3 (three) times daily.     hydrochlorothiazide (MICROZIDE) 12.5 MG capsule Take 1 capsule (12.5 mg total) by mouth daily. 90 capsule 3   ibuprofen (ADVIL) 800 MG tablet Take 800 mg by mouth 3 (three) times daily.     lidocaine (LIDODERM) 5 % Place 1 patch onto the skin daily. Can apply to back on at 8 am and off at 8 pm --has to be off for 12 hours. Purchase over the counter. (Patient taking differently: Place 1 patch onto the skin daily as needed (pain). Can apply to back on at 8 am and off at 8 pm --has to be off for 12 hours. Purchase over the counter.) 30 patch 0   lisinopril (ZESTRIL) 40 MG tablet Take 1 tablet (40 mg total) by mouth daily. 90 tablet 2   loratadine (CLARITIN) 10 MG tablet Take 1 tablet (10 mg total) by mouth daily as needed for allergies. (Patient taking differently: Take 10 mg by mouth daily.) 90 tablet 3   magnesium oxide  (MAG-OX) 400 MG tablet Take 400 mg by mouth daily.     metFORMIN (GLUCOPHAGE-XR) 500 MG 24 hr tablet TAKE 2 TABLETS BY MOUTH DAILY WITH BREAKFAST (Patient taking differently: Take 1,000 mg by mouth daily with breakfast.) 180 tablet 3   Multiple Minerals-Vitamins (CALCIUM-MAGNESIUM-ZINC-D3) TABS Take 1 tablet by mouth daily.     oxyCODONE-acetaminophen (PERCOCET) 10-325 MG tablet Take 1 tablet by mouth every 6 (six) hours as needed for pain. 30 tablet 0   Potassium Chloride ER 20 MEQ TBCR TAKE ONE TABLET BY MOUTH DAILY (Patient taking differently:  Take 20 mEq by mouth daily.) 90 tablet 2   Pseudoephedrine HCl (SUDAFED PO) Take 1 tablet by mouth daily as needed (sinuses).     rosuvastatin (CRESTOR) 20 MG tablet TAKE 1 TABLET(20 MG) BY MOUTH DAILY (Patient taking differently: Take 20 mg by mouth daily. TAKE 1 TABLET(20 MG) BY MOUTH DAILY) 30 tablet 0   sildenafil (VIAGRA) 100 MG tablet Take 0.5-1 tablets (50-100 mg total) by mouth daily as needed for erectile dysfunction. 5 tablet 11   tolterodine (DETROL LA) 4 MG 24 hr capsule Take 1 capsule (4 mg total) by mouth daily. 90 capsule 3   vitamin B-12 (CYANOCOBALAMIN) 1000 MCG tablet Take 1 tablet (1,000 mcg total) by mouth daily. 90 tablet 1   zinc gluconate 50 MG tablet Take 50 mg by mouth daily.     acetaminophen (TYLENOL) 500 MG tablet Take 500 mg by mouth every 6 (six) hours as needed for mild pain, moderate pain, fever or headache.     Blood Glucose Monitoring Suppl (ONE TOUCH ULTRA 2) w/Device KIT Use as directed daily E11.9 1 each 0   glucose blood test strip Use as instructed once daily E119 100 each 12   Lancets MISC Use as directed daily E11.9 100 each 11    Results for orders placed or performed during the hospital encounter of 06/11/21 (from the past 48 hour(s))  Glucose, capillary     Status: Abnormal   Collection Time: 06/11/21  6:26 AM  Result Value Ref Range   Glucose-Capillary 116 (H) 70 - 99 mg/dL    Comment: Glucose reference  range applies only to samples taken after fasting for at least 8 hours.   No results found.  Review of Systems  Musculoskeletal:  Positive for back pain.   Blood pressure (!) 172/83, pulse 62, temperature 97.7 F (36.5 C), resp. rate 18, height 5\' 11"  (1.803 m), weight 117 kg, SpO2 100 %. Physical Exam HENT:     Head: Normocephalic.     Right Ear: Tympanic membrane normal.     Nose: Nose normal.     Mouth/Throat:     Mouth: Mucous membranes are moist.  Eyes:     Pupils: Pupils are equal, round, and reactive to light.  Cardiovascular:     Rate and Rhythm: Normal rate.  Pulmonary:     Effort: Pulmonary effort is normal.  Abdominal:     General: Abdomen is flat.  Musculoskeletal:        General: Normal range of motion.  Neurological:     Mental Status: He is alert.     Comments: Patient is awake alert strength is 5 out of 5 iliopsoas, quads, hamstrings, gastrocs, into tibialis, and EHL.     Assessment/Plan 56 year old presents for redo posterior lateral arthrodesis with removal and replacement of loose pedicle screws.  Mariam Dollar, MD 06/11/2021, 7:18 AM

## 2021-06-12 LAB — GLUCOSE, CAPILLARY: Glucose-Capillary: 116 mg/dL — ABNORMAL HIGH (ref 70–99)

## 2021-06-12 MED ORDER — CYCLOBENZAPRINE HCL 10 MG PO TABS
10.0000 mg | ORAL_TABLET | Freq: Three times a day (TID) | ORAL | 2 refills | Status: DC | PRN
Start: 2021-06-12 — End: 2023-12-11

## 2021-06-12 MED ORDER — OXYCODONE-ACETAMINOPHEN 10-325 MG PO TABS: 1.0000 | ORAL_TABLET | ORAL | 0 refills | Status: DC | PRN

## 2021-06-12 NOTE — Progress Notes (Signed)
Patient is discharged from room 3C06 at this time. Alert and in stable condition. IV site d/c'd and instructions read to patient and sister with understanding verbalized and all questions answered. Left unit via wheelchair with all belongings at side. ?

## 2021-06-12 NOTE — Discharge Summary (Signed)
Discharge Summary ? ?Date of Admission: 06/11/2021 ? ?Date of Discharge: 06/12/21 ? ?Attending Physician: Emelda Brothers, MD ? ?Hospital Course: Patient was admitted following an uncomplicated revision of a lumbar pseudoarthrosis. They were recovered in PACU and transferred to [facility]. Their preop symptoms were completely resolved, their hospital course was uncomplicated and the patient was discharged home on 06/12/21. They will follow up in clinic with me in clinic in 2 weeks. ? ?Neurologic exam at discharge:  ?Strength 5/5 x4 and SILTx4  ? ?Discharge diagnosis: Lumbar pseudoarthrosis ? ?Judith Part, MD ?06/12/21 ?7:43 AM ? ?

## 2021-06-12 NOTE — Progress Notes (Signed)
Neurosurgery Service ?Progress Note ? ?Subjective: No acute events overnight, preop radicular pain resolved, he's happy  ? ?Objective: ?Vitals:  ? 06/11/21 1951 06/11/21 2200 06/12/21 0008 06/12/21 0257  ?BP: (!) 144/92  (!) 151/79 128/70  ?Pulse: 70 72 79 73  ?Resp: '18 18 20 18  '$ ?Temp: 98 ?F (36.7 ?C)   99 ?F (37.2 ?C)  ?TempSrc: Oral   Oral  ?SpO2: 100% 98% 99% 96%  ?Weight:      ?Height:      ? ? ?Physical Exam: ?Strength 5/5 x4 and SILTx4, incision c/d/I w/ dressing intact ? ?Assessment & Plan: ?56 y.o. man s/p revision of lumbar pseudo, recovering well. ? ?-discharge home today ? ?Marcello Moores A Garvey Westcott  ?06/12/21 ?7:35 AM ? ?

## 2021-06-12 NOTE — Evaluation (Signed)
Physical Therapy Evaluation & Discharge ?Patient Details ?Name: Harold [name] ?MRN: [id number] ?DOB: 21-Apr-1965 ?Today's Date: 06/12/2021 ? ?History of Present Illness ? 56 y/o male admitted on 06/11/21 following L3-5 hardware removal and PLIF. PMH: CHF, HTN, neuropathy, OSA on CPAP, T2DM, hx of lumbar fusion, hx of ACDF  ?Clinical Impression ? Patient admitted following above procedure. Patient functioning at modI level with no AD for mobility. Educated patient on brace wear, back precautions, and progressive walking program, patient verbalized understanding. No further skilled PT needs identified acutely. No PT follow up recommended at this time.    ?   ? ?Recommendations for follow up therapy are one component of a multi-disciplinary discharge planning process, led by the attending physician.  Recommendations may be updated based on patient status, additional functional criteria and insurance authorization. ? ?Follow Up Recommendations No PT follow up ? ?  ?Assistance Recommended at Discharge PRN  ?Patient can return home with the following ?   ? ?  ?Equipment Recommendations None recommended by PT  ?Recommendations for Other Services ?    ?  ?Functional Status Assessment Patient has had a recent decline in their functional status and demonstrates the ability to make significant improvements in function in a reasonable and predictable amount of time.  ? ?  ?Precautions / Restrictions Precautions ?Precautions: Back ?Precaution Booklet Issued: Yes (comment) ?Required Braces or Orthoses: Spinal Brace ?Spinal Brace: Lumbar corset;Applied in standing position ?Restrictions ?Weight Bearing Restrictions: No  ? ?  ? ?Mobility ? Bed Mobility ?Overal bed mobility: Modified Independent ?  ?  ?  ?  ?  ?  ?  ?  ? ?Transfers ?Overall transfer level: Modified independent ?Equipment used: None ?  ?  ?  ?  ?  ?  ?  ?  ?  ? ?Ambulation/Gait ?Ambulation/Gait assistance: Modified independent (Device/Increase time) ?Gait Distance  (Feet): 350 Feet ?Assistive device: None ?Gait Pattern/deviations: Step-through pattern, Decreased stride length ?Gait velocity: decreased ?  ?  ?  ? ?Stairs ?Stairs: Yes ?Stairs assistance: Modified independent (Device/Increase time) ?Stair Management: Two rails, Step to pattern, Forwards ?Number of Stairs: 3 ?  ? ?Wheelchair Mobility ?  ? ?Modified Rankin (Stroke Patients Only) ?  ? ?  ? ?Balance Overall balance assessment: Mild deficits observed, not formally tested ?  ?  ?  ?  ?  ?  ?  ?  ?  ?  ?  ?  ?  ?  ?  ?  ?  ?  ?   ? ? ? ?Pertinent Vitals/Pain Pain Assessment ?Pain Assessment: Faces ?Faces Pain Scale: Hurts little more ?Pain Location: back ?Pain Descriptors / Indicators: Discomfort ?Pain Intervention(s): Monitored during session  ? ? ?Home Living Family/patient expects to be discharged to:: Private residence ?Living Arrangements: Spouse/significant other ?Available Help at Discharge: Family;Available 24 hours/day ?Type of Home: House ?Home Access: Stairs to enter ?Entrance Stairs-Rails: Right;Left;Can reach both ?Entrance Stairs-Number of Steps: 3 ?Alternate Level Stairs-Number of Steps: patient reports able to live on main level ?Home Layout: Two level;Able to live on main level with bedroom/bathroom ?Home Equipment: Conservation officer, nature (2 wheels);Rollator (4 wheels);Shower seat ?   ?  ?Prior Function Prior Level of Function : Independent/Modified Independent ?  ?  ?  ?  ?  ?  ?Mobility Comments: uses rollator for long distances ?  ?  ? ? ?Hand Dominance  ?   ? ?  ?Extremity/Trunk Assessment  ? Upper Extremity Assessment ?Upper Extremity Assessment: Defer to OT evaluation ?  ? ?  Lower Extremity Assessment ?Lower Extremity Assessment: Generalized weakness ?  ? ?Cervical / Trunk Assessment ?Cervical / Trunk Assessment: Back Surgery  ?Communication  ? Communication: No difficulties  ?Cognition Arousal/Alertness: Awake/alert ?Behavior During Therapy: [facility] for tasks assessed/performed ?Overall Cognitive Status:  Within Functional Limits for tasks assessed ?  ?  ?  ?  ?  ?  ?  ?  ?  ?  ?  ?  ?  ?  ?  ?  ?  ?  ?  ? ?  ?General Comments   ? ?  ?Exercises    ? ?Assessment/Plan  ?  ?PT Assessment Patient does not need any further PT services  ?PT Problem List   ? ?   ?  ?PT Treatment Interventions     ? ?PT Goals (Current goals can be found in the Care Plan section)  ?Acute Rehab PT Goals ?Patient Stated Goal: to go home ?PT Goal Formulation: All assessment and education complete, DC therapy ? ?  ?Frequency   ?  ? ? ?Co-evaluation   ?  ?  ?  ?  ? ? ?  ?AM-PAC PT "6 Clicks" Mobility  ?Outcome Measure Help needed turning from your back to your side while in a flat bed without using bedrails?: None ?Help needed moving from lying on your back to sitting on the side of a flat bed without using bedrails?: None ?Help needed moving to and from a bed to a chair (including a wheelchair)?: None ?Help needed standing up from a chair using your arms (e.g., wheelchair or bedside chair)?: None ?Help needed to walk in hospital room?: None ?Help needed climbing 3-5 steps with a railing? : None ?6 Click Score: 24 ? ?  ?End of Session Equipment Utilized During Treatment: Back brace ?Activity Tolerance: Patient tolerated treatment well ?Patient left: in bed;with call bell/phone within reach ?Nurse Communication: Mobility status ?PT Visit Diagnosis: Muscle weakness (generalized) (M62.81) ?  ? ?Time: 1761-6073 ?PT Time Calculation (min) (ACUTE ONLY): 23 min ? ? ?Charges:   PT Evaluation ?$PT Eval Low Complexity: 1 Low ?PT Treatments ?$Therapeutic Activity: 8-22 mins ?  ?   ? ? ?Taliya Mcclard A. Gilford Rile, PT, DPT ?Acute Rehabilitation Services ?Pager [phone] ?Office [phone] ? ? ?Imanuel Pruiett A Assia Meanor ?06/12/2021, 9:13 AM ? ?

## 2021-06-12 NOTE — Evaluation (Signed)
POccupational Therapy Evaluation ?Patient Details ?Name: Harold [name] ?MRN: [id number] ?DOB: 09/02/1965 ?Today's Date: 06/12/2021 ? ? ?History of Present Illness 56 y/o male admitted on 06/11/21 following L3-5 hardware removal and PLIF. PMH: CHF, HTN, neuropathy, OSA on CPAP, T2DM, hx of lumbar fusion, hx of ACDF  ? ?Clinical Impression ?  ?Pt is s/p spinal surgery. Pt presents to OT with decreased I with ADL activity and benefited from education regarding back precautions. Pt able to demonstrate and verbalize post OT session ?   ? ?Recommendations for follow up therapy are one component of a multi-disciplinary discharge planning process, led by the attending physician.  Recommendations may be updated based on patient status, additional functional criteria and insurance authorization.  ? ?Follow Up Recommendations ? No OT follow up  ?  ?Assistance Recommended at Discharge PRN  ?Patient can return home with the following A little help with bathing/dressing/bathroom ? ?  ?Functional Status Assessment ?    ?Equipment Recommendations ? None recommended by OT  ?  ?Recommendations for Other Services   ? ? ?  ?Precautions / Restrictions Precautions ?Precautions: Back ?Precaution Booklet Issued: Yes (comment) ?Required Braces or Orthoses: Spinal Brace ?Spinal Brace: Lumbar corset;Applied in standing position ?Restrictions ?Weight Bearing Restrictions: No  ? ?  ? ?Mobility Bed Mobility ?Overal bed mobility: Modified Independent ?  ?  ?  ?  ?  ?  ?  ?  ? ?Transfers ?Overall transfer level: Needs assistance (min guard) ?Equipment used: None ?  ?  ?  ?  ?  ?  ?  ?  ?  ? ?  ?Balance Overall balance assessment: Mild deficits observed, not formally tested ?  ?  ?  ?  ?  ?  ?  ?  ?  ?  ?  ?  ?  ?  ?  ?  ?  ?  ?   ? ?ADL either performed or assessed with clinical judgement  ? ?ADL Overall ADL's : Needs assistance/impaired ?Eating/Feeding: Set up ?  ?Grooming: Supervision/safety;Standing ?  ?Upper Body Bathing: Set up;Sitting ?   ?Lower Body Bathing: Minimal assistance;Sit to/from stand ?  ?Upper Body Dressing : Set up;Sitting ?  ?Lower Body Dressing: Minimal assistance;Sit to/from stand ?  ?Toilet Transfer: Ambulation;Min guard ?  ?Toileting- Water quality scientist and Hygiene: Min guard;Sit to/from stand ?  ?Tub/ Shower Transfer: Min guard ?  ?Functional mobility during ADLs: Min guard ?   ? ? ? ?Vision Baseline Vision/History: 0 No visual deficits ?   ?   ?   ?   ? ?Pertinent Vitals/Pain Pain Assessment ?Pain Assessment: Faces ?Pain Score: 2  ?Faces Pain Scale: Hurts a little bit ?Pain Location: back ?Pain Descriptors / Indicators: Discomfort  ? ? ? ?Hand Dominance   ?  ?Extremity/Trunk Assessment Upper Extremity Assessment ?Upper Extremity Assessment: Generalized weakness ?  ?Lower Extremity Assessment ?Lower Extremity Assessment: Generalized weakness ?  ?Cervical / Trunk Assessment ?Cervical / Trunk Assessment: Back Surgery ?  ?Communication Communication ?Communication: No difficulties ?  ?Cognition Arousal/Alertness: Awake/alert ?Behavior During Therapy: [facility] for tasks assessed/performed ?Overall Cognitive Status: Within Functional Limits for tasks assessed ?  ?  ?  ?  ?  ?  ?  ?  ?  ?  ?  ?  ?  ?  ?  ?  ?  ?  ?  ?   ?   ?   ? ? ?Home Living Family/patient expects to be discharged to:: Private residence ?Living Arrangements: Spouse/significant other ?  Available Help at Discharge: Family;Available 24 hours/day ?Type of Home: House ?Home Access: Stairs to enter ?Entrance Stairs-Number of Steps: 3 ?Entrance Stairs-Rails: Right;Left;Can reach both ?Home Layout: Two level;Able to live on main level with bedroom/bathroom ?Alternate Level Stairs-Number of Steps: patient reports able to live on main level ?  ?Bathroom Shower/Tub: Tub/shower unit ?  ?Bathroom Toilet: Standard ?  ?  ?Home Equipment: Conservation officer, nature (2 wheels);Rollator (4 wheels);Shower seat ?  ?  ?  ? ?  ?Prior Functioning/Environment Prior Level of Function : Independent/Modified  Independent ?  ?  ?  ?  ?  ?  ?Mobility Comments: uses rollator for long distances ?  ?  ? ?   ?   ?   ?   ?OT Goals(Current goals can be found in the care plan section) Acute Rehab OT Goals ?Patient Stated Goal: home today ?OT Goal Formulation: With patient  ?OT Frequency:   ?  ? ?   ?AM-PAC OT "6 Clicks" Daily Activity     ?Outcome Measure Help from another person eating meals?: None ?Help from another person taking care of personal grooming?: None ?Help from another person toileting, which includes using toliet, bedpan, or urinal?: A Little ?Help from another person bathing (including washing, rinsing, drying)?: A Little ?Help from another person to put on and taking off regular upper body clothing?: None ?Help from another person to put on and taking off regular lower body clothing?: A Little ?6 Click Score: 21 ?  ?End of Session Nurse Communication: Mobility status ? ?Activity Tolerance: Patient tolerated treatment well ?Patient left: in chair;with call bell/phone within reach ? ?   ?              ?Time: 6295-2841 ?OT Time Calculation (min): 16 min ?Charges:  OT General Charges ?$OT Visit: 1 Visit ?OT Evaluation ?$OT Eval Low Complexity: 1 Low ? ?Kari Baars, OT ?Acute Rehabilitation Services ?[phone] ?Office- [phone] ? ?  ? ?[name] ?06/12/2021, 11:29 AM ?

## 2021-06-12 NOTE — Discharge Instructions (Signed)
Wound Care  Keep the incision clean and dry remove the outer dressing in 3 days, leave the Steri-Strips intact. Do not put any creams, lotions, or ointments on incision. Leave steri-strips on back.  They will fall off by themselves.  Activity Walk each and every day, increasing distance each day. No lifting greater than 5 lbs.  No lifting no bending no twisting no driving or riding a car unless coming back and forth to see me. If provided with back brace, wear when out of bed.  It is not necessary to wear brace in bed. Diet Resume your normal diet.   Return to Work Will be discussed at you follow up appointment.  Call Your Doctor If Any of These Occur Redness, drainage, or swelling at the wound.  Temperature greater than 101 degrees. Severe pain not relieved by pain medication. Incision starts to come apart. Follow Up Appt Call today for appointment in 1-2 weeks ([phone]) or for problems.  If you have any hardware placed in your spine, you will need an x-ray before your appointment.   

## 2021-06-14 ENCOUNTER — Ambulatory Visit: Payer: Medicare HMO | Admitting: Cardiovascular Disease

## 2021-06-14 MED FILL — Thrombin For Soln 20000 Unit: CUTANEOUS | Qty: 1 | Status: AC

## 2021-07-06 IMAGING — RF DG CERVICAL SPINE 1V
1 series · 1 of 1 positions shown · non-contrast
Comparison: None.

CLINICAL DATA: ACDF.

EXAM:
DG CERVICAL SPINE - 1 VIEW; DG C-ARM 1-60 MIN

[Series 1: run · 1 of 1 slices shown]
[im 1/1]
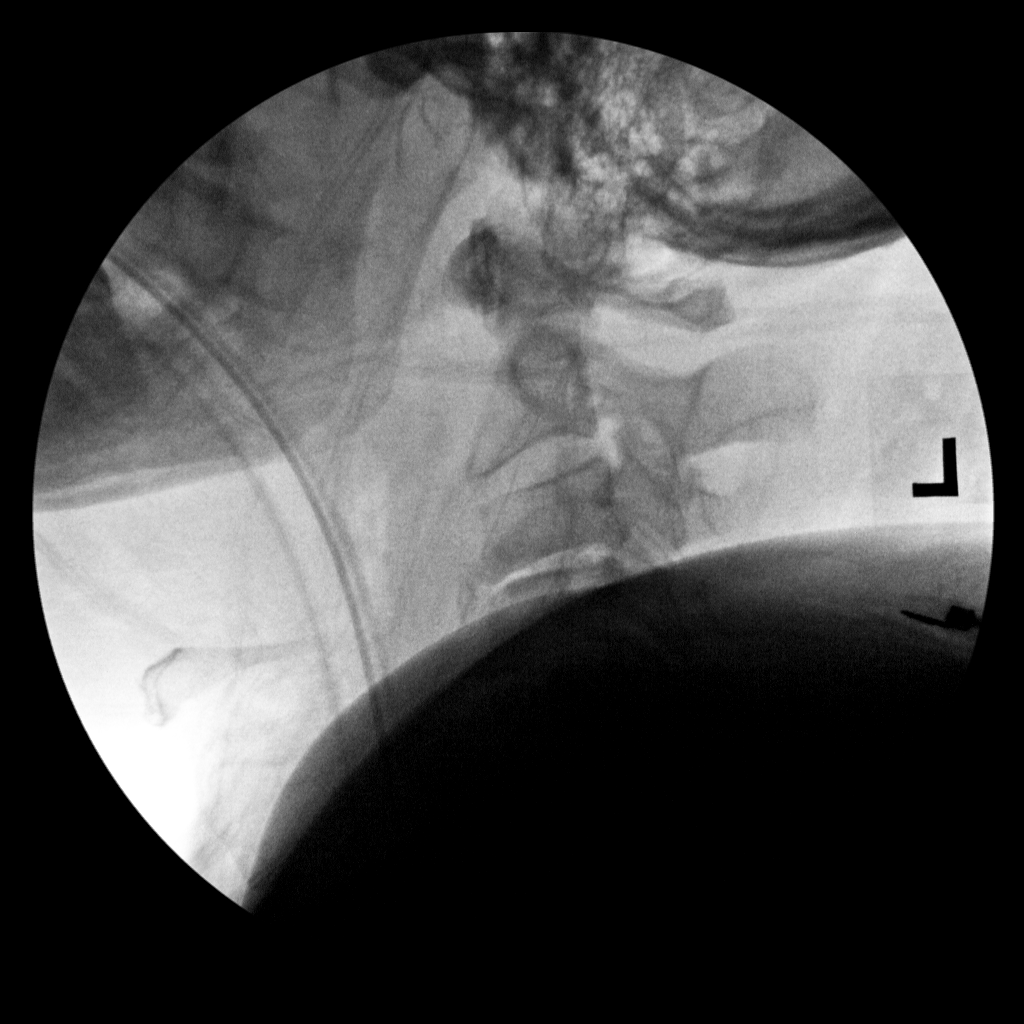

[1 of 1 positions shown; findings below may reference images not displayed]

FINDINGS: Single intraoperative cross-table lateral view of the cervical spine
is submitted. Occiput to C3-4 is visualized. Degenerative changes at
C3-4. No hardware is visualized.
IMPRESSION: Intraoperative visualization of the upper cervical spine.

## 2021-07-07 ENCOUNTER — Other Ambulatory Visit: Payer: Self-pay | Admitting: Internal Medicine

## 2021-07-07 NOTE — Telephone Encounter (Signed)
Please refill as per office routine med refill policy (all routine meds to be refilled for 3 mo or monthly (per pt preference) up to one year from last visit, then month to month grace period for 3 mo, then further med refills will have to be denied) ? ?

## 2021-07-12 ENCOUNTER — Other Ambulatory Visit: Payer: Self-pay | Admitting: Internal Medicine

## 2021-07-13 NOTE — Telephone Encounter (Signed)
Please refill as per office routine med refill policy (all routine meds to be refilled for 3 mo or monthly (per pt preference) up to one year from last visit, then month to month grace period for 3 mo, then further med refills will have to be denied) ? ?

## 2021-07-20 DIAGNOSIS — G894 Chronic pain syndrome: Secondary | ICD-10-CM | POA: Diagnosis not present

## 2021-07-20 DIAGNOSIS — S32009K Unspecified fracture of unspecified lumbar vertebra, subsequent encounter for fracture with nonunion: Secondary | ICD-10-CM | POA: Diagnosis not present

## 2021-07-27 DIAGNOSIS — S32009K Unspecified fracture of unspecified lumbar vertebra, subsequent encounter for fracture with nonunion: Secondary | ICD-10-CM | POA: Diagnosis not present

## 2021-07-28 ENCOUNTER — Other Ambulatory Visit: Payer: Self-pay | Admitting: Neurosurgery

## 2021-07-28 DIAGNOSIS — S32009K Unspecified fracture of unspecified lumbar vertebra, subsequent encounter for fracture with nonunion: Secondary | ICD-10-CM

## 2021-08-02 ENCOUNTER — Encounter: Payer: Self-pay | Admitting: Podiatry

## 2021-08-02 ENCOUNTER — Ambulatory Visit: Payer: Medicare HMO | Admitting: Podiatry

## 2021-08-02 DIAGNOSIS — B351 Tinea unguium: Secondary | ICD-10-CM

## 2021-08-02 DIAGNOSIS — M7752 Other enthesopathy of left foot: Secondary | ICD-10-CM

## 2021-08-02 DIAGNOSIS — M79674 Pain in right toe(s): Secondary | ICD-10-CM

## 2021-08-02 DIAGNOSIS — M79675 Pain in left toe(s): Secondary | ICD-10-CM

## 2021-08-02 MED ORDER — BETAMETHASONE SOD PHOS & ACET 6 (3-3) MG/ML IJ SUSP
3.0000 mg | Freq: Once | INTRAMUSCULAR | Status: AC
  Administered 2021-08-02: 3 mg via INTRA_ARTICULAR

## 2021-08-02 NOTE — Progress Notes (Signed)
? ?SUBJECTIVE ?Patient with a history of diabetes mellitus presents to office today complaining of elongated, thickened nails that cause pain while ambulating in shoes.  Patient is unable to trim their own nails.   ? ?Patient states that the last injection he received to the great toe joint helped significantly.  The pain is slowly coming back and he is requesting an additional injection today. ? ? ?Past Medical History:  ?Diagnosis Date  ? Acute idiopathic pericarditis 04/08/2010  ? Qualifier: Diagnosis of  By: [name], [name]    ? Acute sinusitis 06/04/2014  ? Anxiety   ? CHF (congestive heart failure) ([place]) 06/29/2010  ? was pericardititis not CHF  ? Chronic back pain   ? Constipation   ? DDD (degenerative disc disease), lumbar 09/14/2012  ? Depression   ? Erectile dysfunction 09/14/2012  ? GERD (gastroesophageal reflux disease)   ? ocassional  ? Gout, unspecified 05/01/2007  ? Qualifier: Diagnosis of  By: [name] MD, [name]   ? Headache   ? migraines in the past  ? History of pancreatitis   ? a. admx 04-2009.Marland KitchenMarland Kitchen? 2-2 triglycerides  ? Hypertension   ? Hypertriglyceridemia   ? a. followed by [facility]  ? Left sided sciatica 09/14/2012  ? Neuropathy   ? Obesity   ? OSA (obstructive sleep apnea)   ? CPAP- not current  ? Restless legs   ? Type 2 diabetes mellitus with diabetic neuropathy, unspecified ([place]) 09/20/2019  ? ?Past Surgical History:  ?Procedure Laterality Date  ? ANTERIOR CERVICAL DECOMP/DISCECTOMY FUSION N/A 09/14/2020  ? Procedure: Anterior Cervical Decompression Fusion - Cervical three-Cervical four;  Surgeon: Kary Kos, MD;  Location: [facility];  Service: Neurosurgery;  Laterality: N/A;  ? COLONOSCOPY    ? ELBOW SURGERY Left   ? KNEE SURGERY Right   ? x3 scopes  ? KNEE SURGERY Right   ? open menisicus repair  ? LUMBAR LAMINECTOMY/DECOMPRESSION MICRODISCECTOMY N/A 08/26/2015  ? Procedure: Lumbar Two-Sacral One  Laminectomy for decompression;  Surgeon: [name], MD;  Location: [facility];   Service: Neurosurgery;  Laterality: N/A;  ? pericardectomy  07/15/2010  ? Dr. [name]  ? pleurx catheter placement  07/15/2010  ? Dr. [name]  ? SHOULDER SURGERY    ? right and left shoulders- Rotator cuff repair  ? TONSILLECTOMY AND ADENOIDECTOMY    ? one tonsil  ? ?Allergies  ?Allergen Reactions  ? Other Itching, Swelling and Palpitations  ?  Pecans: itching and swelling of the tongue   ? Diphenhydramine Itching, Palpitations and Other (See Comments)  ?  "jittery" ?Pt states he can take Benadryl in an emergency situation  ? Pecan Nut (Diagnostic)   ? Colchicine Diarrhea  ? Lipitor [Atorvastatin] Other (See Comments)  ?  Muscle cramps  ? Septra [Sulfamethoxazole-Trimethoprim] Itching  ? ? ?OBJECTIVE ?General Patient is awake, alert, and oriented x 3 and in no acute distress. ?Derm Skin is dry and supple bilateral. Negative open lesions or macerations. Remaining integument unremarkable. Nails are tender, long, thickened and dystrophic with subungual debris, consistent with onychomycosis, 1-5 bilateral. No signs of infection noted. ?Vasc  DP and PT pedal pulses palpable bilaterally. Temperature gradient within normal limits.  ?Neuro Epicritic and protective threshold sensation diminished bilaterally.  ?Musculoskeletal Exam there continues to be some Limited range of motion with pain on palpation and range of motion to the first MTP joint of the left foot consistent with a hallux limitus ?Radiographic exam B/L feet 05/03/2021 normal osseous mineralization.  Joint spaces mostly preserved.  No fractures identified.  There is some degenerative changes and periarticular spurring noted to the left first MTP joint.  On lateral view there does appear to be an avulsion fragment from the base of the proximal phalanx also visualized on medial oblique.  Chronic in nature. ? ?ASSESSMENT ?1. Diabetes Mellitus w/ peripheral neuropathy ?2.  Pain due to onychomycosis of toenails bilateral ?3.  Hallux limitus/MTP capsulitis  left ? ?PLAN OF CARE ?1. Patient evaluated today. ?2. Instructed to maintain good pedal hygiene and foot care. Stressed importance of controlling blood sugar.  ?3. Mechanical debridement of nails 1-5 bilaterally performed using a nail nipper. Filed with dremel without incident.  ?4.  Injection of 0.5 cc Celestone Soluspan injected the first MTP joint left  ?5.  Return to clinic in 3 mos.  ? ?*Goes by [name] ? ? ? ?Edrick Kins, DPM ?[place] ? ?Dr. Edrick Kins, DPM  ?  ?[address]                                      ?[place],  [address]                ?Office [phone]  ?Fax [phone] ? ? ? ? ? ?

## 2021-08-10 ENCOUNTER — Encounter: Payer: Self-pay | Admitting: Internal Medicine

## 2021-08-10 ENCOUNTER — Other Ambulatory Visit: Payer: Self-pay | Admitting: Internal Medicine

## 2021-08-11 MED ORDER — SILDENAFIL CITRATE 100 MG PO TABS: 50.0000 mg | ORAL_TABLET | Freq: Every day | ORAL | 5 refills | Status: DC | PRN

## 2021-08-13 ENCOUNTER — Other Ambulatory Visit: Payer: Self-pay

## 2021-08-13 MED ORDER — AMLODIPINE BESYLATE 10 MG PO TABS: 10.0000 mg | ORAL_TABLET | Freq: Every day | ORAL | 1 refills | Status: DC

## 2021-08-26 ENCOUNTER — Ambulatory Visit: Payer: Medicare HMO | Admitting: Internal Medicine

## 2021-08-31 ENCOUNTER — Ambulatory Visit: Payer: Medicare HMO | Admitting: Cardiology

## 2021-08-31 ENCOUNTER — Encounter: Payer: Self-pay | Admitting: Cardiology

## 2021-08-31 VITALS — BP 130/72 | HR 61 | Ht 71.0 in | Wt 260.2 lb

## 2021-08-31 DIAGNOSIS — I3139 Other pericardial effusion (noninflammatory): Secondary | ICD-10-CM

## 2021-08-31 DIAGNOSIS — R9431 Abnormal electrocardiogram [ECG] [EKG]: Secondary | ICD-10-CM

## 2021-08-31 MED ORDER — FLUCONAZOLE 100 MG PO TABS: 100.0000 mg | ORAL_TABLET | Freq: Every day | ORAL | 0 refills | Status: DC

## 2021-08-31 NOTE — Addendum Note (Signed)
Addended by: Patria Mane A on: 08/31/2021 05:30 PM   Modules accepted: Orders

## 2021-08-31 NOTE — Patient Instructions (Signed)
Medication Instructions:  Your physician recommends that you continue on your current medications as directed. Please refer to the Current Medication list given to you today.  *If you need a refill on your cardiac medications before your next appointment, please call your pharmacy*  Follow-Up: At [facility], you and your health needs are our priority.  As part of our continuing mission to provide you with exceptional heart care, we have created designated Provider Care Teams.  These Care Teams include your primary Cardiologist (physician) and Advanced Practice Providers (APPs -  Physician Assistants and Nurse Practitioners) who all work together to provide you with the care you need, when you need it.  We recommend signing up for the patient portal called "MyChart".  Sign up information is provided on this After Visit Summary.  MyChart is used to connect with patients for Virtual Visits (Telemedicine).  Patients are able to view lab/test results, encounter notes, upcoming appointments, etc.  Non-urgent messages can be sent to your provider as well.   To learn more about what you can do with MyChart, go to [email or web address].    Your next appointment:   12 month(s)  The format for your next appointment:   In Person  Provider:   Minus Breeding, MD {    Important Information About Sugar

## 2021-08-31 NOTE — Progress Notes (Signed)
Cardiology Office Note   Date:  08/31/2021   ID:  Harold [name], DOB 05-31-1965, MRN [id number]  PCP:  Biagio Borg, MD  Cardiologist:   Minus Breeding, MD   No chief complaint on file.     History of Present Illness: Harold [name] is a 56 y.o. male  who presents for evaluation of multiple cardiovascular risk factors.  I saw him in the past for pericarditis.    In 2017 he had a POET (Plain Old Exercise Treadmill) after the last visit.  He had no ischemia but did have a hypertensive response.   He has had pericarditis in the past.   Since I last saw him he has done okay from a cardiac standpoint.  He has had to have a couple of lumbar surgeries and a cervical neck surgery.  He is just recovering from this.  He is now able to be back at the gym doing walking on a treadmill and is going to get into a pool.  He has not had any chest pressure, neck or arm discomfort.  Has had no new shortness of breath, PND or orthopnea.  He has not had any palpitations, presyncope or syncope.  He has had none of the symptoms that he had previously with his pericarditis.  Past Medical History:  Diagnosis Date   Acute idiopathic pericarditis 04/08/2010   Qualifier: Diagnosis of  By: [name], [name]     Acute sinusitis 06/04/2014   Anxiety    CHF (congestive heart failure) ([place]) 06/29/2010   was pericardititis not CHF   Chronic back pain    Constipation    DDD (degenerative disc disease), lumbar 09/14/2012   Depression    Erectile dysfunction 09/14/2012   GERD (gastroesophageal reflux disease)    ocassional   Gout, unspecified 05/01/2007   Qualifier: Diagnosis of  By: [name] MD, Dellis Filbert A    Headache    migraines in the past   History of pancreatitis    a. admx 04-2009.Marland KitchenMarland Kitchen? 2-2 triglycerides   Hypertension    Hypertriglyceridemia    a. followed by [facility]   Left sided sciatica 09/14/2012   Neuropathy    Obesity    OSA (obstructive sleep apnea)    CPAP- not current   Restless legs    Type  2 diabetes mellitus with diabetic neuropathy, unspecified ([place]) 09/20/2019    Past Surgical History:  Procedure Laterality Date   ANTERIOR CERVICAL DECOMP/DISCECTOMY FUSION N/A 09/14/2020   Procedure: Anterior Cervical Decompression Fusion - Cervical three-Cervical four;  Surgeon: Kary Kos, MD;  Location: [facility];  Service: Neurosurgery;  Laterality: N/A;   COLONOSCOPY     ELBOW SURGERY Left    KNEE SURGERY Right    x3 scopes   KNEE SURGERY Right    open menisicus repair   LUMBAR LAMINECTOMY/DECOMPRESSION MICRODISCECTOMY N/A 08/26/2015   Procedure: Lumbar Two-Sacral One  Laminectomy for decompression;  Surgeon: [name], MD;  Location: [facility];  Service: Neurosurgery;  Laterality: N/A;   pericardectomy  07/15/2010   Dr. [name]   pleurx catheter placement  07/15/2010   Dr. [name]   SHOULDER SURGERY     right and left shoulders- Rotator cuff repair   TONSILLECTOMY AND ADENOIDECTOMY     one tonsil     Current Outpatient Medications  Medication Sig Dispense Refill   amLODipine (NORVASC) 10 MG tablet Take 1 tablet (10 mg total) by mouth daily. 90 tablet 1   ascorbic acid (  VITAMIN C) 500 MG tablet Take 500 mg by mouth daily.     aspirin EC 81 MG tablet Take 81 mg by mouth daily.     Blood Glucose Monitoring Suppl (ONE TOUCH ULTRA 2) w/Device KIT Use as directed daily E11.9 1 each 0   Cholecalciferol (VITAMIN D3 PO) Take 1 capsule by mouth daily.     citalopram (CELEXA) 40 MG tablet TAKE 1 TABLET(40 MG) BY MOUTH DAILY (Patient taking differently: Take 40 mg by mouth daily.) 90 tablet 1   cyclobenzaprine (FLEXERIL) 10 MG tablet Take 1 tablet (10 mg total) by mouth 3 (three) times daily as needed for muscle spasms. 30 tablet 2   diclofenac Sodium (VOLTAREN) 1 % GEL Apply 1 application topically 3 (three) times daily as needed (PAIN). 150 g 0   docusate sodium (COLACE) 100 MG capsule Take 1 capsule (100 mg total) by mouth 2 (two) times daily. Purchase over the counter  (Patient taking differently: Take 100 mg by mouth daily as needed for mild constipation. Purchase over the counter) 60 capsule 0   fenofibrate (TRICOR) 145 MG tablet TAKE 1 TABLET(145 MG) BY MOUTH DAILY (Patient taking differently: Take 145 mg by mouth daily.) 90 tablet 1   fluticasone (FLONASE) 50 MCG/ACT nasal spray Place 2 sprays into both nostrils daily. 16 g 6   gabapentin (NEURONTIN) 300 MG capsule Take 900 mg by mouth 3 (three) times daily.     glucose blood test strip Use as instructed once daily E119 100 each 12   hydrochlorothiazide (MICROZIDE) 12.5 MG capsule Take 1 capsule (12.5 mg total) by mouth daily. 90 capsule 3   ibuprofen (ADVIL) 800 MG tablet Take 800 mg by mouth 3 (three) times daily.     Lancets MISC Use as directed daily E11.9 100 each 11   lidocaine (LIDODERM) 5 % Place 1 patch onto the skin daily. Can apply to back on at 8 am and off at 8 pm --has to be off for 12 hours. Purchase over the counter. (Patient taking differently: Place 1 patch onto the skin daily as needed (pain). Can apply to back on at 8 am and off at 8 pm --has to be off for 12 hours. Purchase over the counter.) 30 patch 0   lisinopril (ZESTRIL) 40 MG tablet TAKE 1 TABLET(40 MG) BY MOUTH DAILY 90 tablet 2   loratadine (CLARITIN) 10 MG tablet TAKE 1 TABLET(10 MG) BY MOUTH DAILY AS NEEDED FOR ALLERGIES 90 tablet 3   magnesium oxide (MAG-OX) 400 MG tablet Take 400 mg by mouth daily.     metFORMIN (GLUCOPHAGE-XR) 500 MG 24 hr tablet TAKE 2 TABLETS BY MOUTH DAILY WITH BREAKFAST (Patient taking differently: Take 1,000 mg by mouth daily with breakfast.) 180 tablet 3   Multiple Minerals-Vitamins (CALCIUM-MAGNESIUM-ZINC-D3) TABS Take 1 tablet by mouth daily.     oxyCODONE-acetaminophen (PERCOCET) 10-325 MG tablet Take 1 tablet by mouth every 4 (four) hours as needed for pain. 120 tablet 0   Potassium Chloride ER 20 MEQ TBCR TAKE ONE TABLET BY MOUTH DAILY (Patient taking differently: Take 20 mEq by mouth daily.) 90  tablet 2   rosuvastatin (CRESTOR) 20 MG tablet TAKE 1 TABLET(20 MG) BY MOUTH DAILY (Patient taking differently: Take 20 mg by mouth daily. TAKE 1 TABLET(20 MG) BY MOUTH DAILY) 30 tablet 0   sildenafil (VIAGRA) 100 MG tablet Take 0.5-1 tablets (50-100 mg total) by mouth daily as needed for erectile dysfunction. 5 tablet 5   tiZANidine (ZANAFLEX) 4 MG tablet Take  4 mg by mouth 3 (three) times daily as needed.     tolterodine (DETROL LA) 4 MG 24 hr capsule Take 1 capsule (4 mg total) by mouth daily. 90 capsule 3   vitamin B-12 (CYANOCOBALAMIN) 1000 MCG tablet Take 1 tablet (1,000 mcg total) by mouth daily. 90 tablet 1   zinc gluconate 50 MG tablet Take 50 mg by mouth daily.     No current facility-administered medications for this visit.    Allergies:   Other, Diphenhydramine, Pecan nut (diagnostic), Colchicine, Lipitor [atorvastatin], and Septra [sulfamethoxazole-trimethoprim]    ROS:  Please see the history of present illness.   Otherwise, review of systems are positive for none.   All other systems are reviewed and negative.    PHYSICAL EXAM: VS:  BP 130/72   Pulse 61   Ht 5' 11" (1.803 m)   Wt 260 lb 3.2 oz (118 kg)   SpO2 97%   BMI 36.29 kg/m  , BMI Body mass index is 36.29 kg/m. GENERAL:  Well appearing NECK:  No jugular venous distention, waveform within normal limits, carotid upstroke brisk and symmetric, no bruits, no thyromegaly LUNGS:  Clear to auscultation bilaterally CHEST:  Unremarkable HEART:  PMI not displaced or sustained,S1 and S2 within normal limits, no S3, no S4, no clicks, no rubs, no murmurs ABD:  Flat, positive bowel sounds normal in frequency in pitch, no bruits, no rebound, no guarding, no midline pulsatile mass, no hepatomegaly, no splenomegaly EXT:  2 plus pulses throughout, no edema, no cyanosis no clubbing   EKG:  EKG is ordered today. Normal sinus rhythm, rate 61, axis within normal limits, intervals within normal limits, poor anterior R wave  progression, nonspecific inferior and lateral T wave mature versions not changed from previous.   Recent Labs: 09/27/2020: B Natriuretic Peptide 78.3 03/08/2021: ALT 13; TSH 0.60 06/10/2021: BUN 9; Creatinine, Ser 0.79; Hemoglobin 13.9; Platelets 471; Potassium 4.3; Sodium 139    Lipid Panel    Component Value Date/Time   CHOL 180 03/08/2021 1228   TRIG 338.0 (H) 03/08/2021 1228   HDL 23.50 (L) 03/08/2021 1228   CHOLHDL 8 03/08/2021 1228   VLDL 67.6 (H) 03/08/2021 1228   LDLCALC 85 08/07/2019 1421   LDLDIRECT 121.0 03/08/2021 1228      Wt Readings from Last 3 Encounters:  08/31/21 260 lb 3.2 oz (118 kg)  06/11/21 258 lb (117 kg)  06/10/21 258 lb 11.2 oz (117.3 kg)      Other studies Reviewed: Additional studies/ records that were reviewed today include: Labs Review of the above records demonstrates:  Please see elsewhere in the note.     ASSESSMENT AND PLAN:    OBESITY:   He has had weight gain with his surgeries but now can start losing this.  No change in therapy.   DM:  A1c was 6.4.  This is down from previous.  No change in therapy.   TOBACCO ABUSE:   We again talked about the need to stop smoking.  HTN:     BP is at target.  No change in therapy.   QT PROLONGED: QT is normal today.  No change in therapy.  SLEEP APNEA:    He is being managed by Dr. Claiborne Billings for sleep apnea.  He is using BiPAP.  PERICARDIAL EFFUSION:    He has had no symptomatic recurrence of this.  No change in therapy.  THRUSH: He has had recurrent thrush and feels like he is having this again.  I  will give him nystatin swish and swallow.  His QT was normal   Current medicines are reviewed at length with the patient today.  The patient does not have concerns regarding medicines.  The following changes have been made: None  Labs/ tests ordered today include: None  Orders Placed This Encounter  Procedures   EKG 12-Lead     Disposition:   FU with me in one year.     Signed, Minus Breeding, MD  08/31/2021 5:22 PM    [facility]

## 2021-09-06 ENCOUNTER — Inpatient Hospital Stay: Admission: RE | Admit: 2021-09-06 | Payer: Medicare HMO | Source: Ambulatory Visit

## 2021-09-06 ENCOUNTER — Ambulatory Visit
Admission: RE | Admit: 2021-09-06 | Discharge: 2021-09-06 | Disposition: A | Discharge status: Home / Self Care | Payer: Medicare HMO | Source: Ambulatory Visit | Attending: Neurosurgery | Admitting: Neurosurgery

## 2021-09-06 DIAGNOSIS — S32009K Unspecified fracture of unspecified lumbar vertebra, subsequent encounter for fracture with nonunion: Secondary | ICD-10-CM

## 2021-09-06 DIAGNOSIS — M4326 Fusion of spine, lumbar region: Secondary | ICD-10-CM | POA: Diagnosis not present

## 2021-09-06 DIAGNOSIS — M25511 Pain in right shoulder: Secondary | ICD-10-CM | POA: Diagnosis not present

## 2021-09-06 DIAGNOSIS — M47817 Spondylosis without myelopathy or radiculopathy, lumbosacral region: Secondary | ICD-10-CM | POA: Diagnosis not present

## 2021-09-06 DIAGNOSIS — M48061 Spinal stenosis, lumbar region without neurogenic claudication: Secondary | ICD-10-CM | POA: Diagnosis not present

## 2021-09-06 DIAGNOSIS — M4316 Spondylolisthesis, lumbar region: Secondary | ICD-10-CM | POA: Diagnosis not present

## 2021-09-13 DIAGNOSIS — M25511 Pain in right shoulder: Secondary | ICD-10-CM | POA: Diagnosis not present

## 2021-09-15 ENCOUNTER — Ambulatory Visit (INDEPENDENT_AMBULATORY_CARE_PROVIDER_SITE_OTHER): Payer: Medicare HMO | Admitting: Podiatry

## 2021-09-15 DIAGNOSIS — M7752 Other enthesopathy of left foot: Secondary | ICD-10-CM | POA: Diagnosis not present

## 2021-09-15 DIAGNOSIS — M7751 Other enthesopathy of right foot: Secondary | ICD-10-CM

## 2021-09-15 MED ORDER — BETAMETHASONE SOD PHOS & ACET 6 (3-3) MG/ML IJ SUSP
3.0000 mg | Freq: Once | INTRAMUSCULAR | Status: AC
  Administered 2021-09-15: 3 mg via INTRA_ARTICULAR

## 2021-09-15 NOTE — Progress Notes (Signed)
SUBJECTIVE Patient with a history of diabetes mellitus presents to office today for follow-up evaluation of bilateral great toe pain.  Patient states that the injections have helped significantly in the past.  He is requesting another injection today.  He presents for further treatment and evaluation   Past Medical History:  Diagnosis Date   Acute idiopathic pericarditis 04/08/2010   Qualifier: Diagnosis of  By: [name], [name]     Acute sinusitis 06/04/2014   Anxiety    CHF (congestive heart failure) ([place]) 06/29/2010   was pericardititis not CHF   Chronic back pain    Constipation    DDD (degenerative disc disease), lumbar 09/14/2012   Depression    Erectile dysfunction 09/14/2012   GERD (gastroesophageal reflux disease)    ocassional   Gout, unspecified 05/01/2007   Qualifier: Diagnosis of  By: [name] MD, Dellis Filbert A    Headache    migraines in the past   History of pancreatitis    a. admx 04-2009.Marland KitchenMarland Kitchen? 2-2 triglycerides   Hypertension    Hypertriglyceridemia    a. followed by [facility]   Left sided sciatica 09/14/2012   Neuropathy    Obesity    OSA (obstructive sleep apnea)    CPAP- not current   Restless legs    Type 2 diabetes mellitus with diabetic neuropathy, unspecified ([place]) 09/20/2019   Past Surgical History:  Procedure Laterality Date   ANTERIOR CERVICAL DECOMP/DISCECTOMY FUSION N/A 09/14/2020   Procedure: Anterior Cervical Decompression Fusion - Cervical three-Cervical four;  Surgeon: Kary Kos, MD;  Location: [facility];  Service: Neurosurgery;  Laterality: N/A;   COLONOSCOPY     ELBOW SURGERY Left    KNEE SURGERY Right    x3 scopes   KNEE SURGERY Right    open menisicus repair   LUMBAR LAMINECTOMY/DECOMPRESSION MICRODISCECTOMY N/A 08/26/2015   Procedure: Lumbar Two-Sacral One  Laminectomy for decompression;  Surgeon: [name], MD;  Location: [facility];  Service: Neurosurgery;  Laterality: N/A;   pericardectomy  07/15/2010   Dr. [name]   pleurx  catheter placement  07/15/2010   Dr. [name]   SHOULDER SURGERY     right and left shoulders- Rotator cuff repair   TONSILLECTOMY AND ADENOIDECTOMY     one tonsil   Allergies  Allergen Reactions   Other Itching, Swelling and Palpitations    Pecans: itching and swelling of the tongue    Diphenhydramine Itching, Palpitations and Other (See Comments)    "jittery" Pt states he can take Benadryl in an emergency situation   Pecan Nut (Diagnostic)    Colchicine Diarrhea   Lipitor [Atorvastatin] Other (See Comments)    Muscle cramps   Septra [Sulfamethoxazole-Trimethoprim] Itching    OBJECTIVE General Patient is awake, alert, and oriented x 3 and in no acute distress. Derm Skin is dry and supple bilateral. Negative open lesions or macerations. Remaining integument unremarkable. Nails are tender, long, thickened and dystrophic with subungual debris, consistent with onychomycosis, 1-5 bilateral. No signs of infection noted. Vasc  DP and PT pedal pulses palpable bilaterally. Temperature gradient within normal limits.  Neuro Epicritic and protective threshold sensation diminished bilaterally.  Musculoskeletal Exam there continues to be some Limited range of motion with pain on palpation and range of motion to the first MTP joint of the left foot consistent with a hallux limitus Radiographic exam B/L feet 05/03/2021 normal osseous mineralization.  Joint spaces mostly preserved.  No fractures identified.  There is some degenerative changes and periarticular spurring noted  to the left first MTP joint.  On lateral view there does appear to be an avulsion fragment from the base of the proximal phalanx also visualized on medial oblique.  Chronic in nature.  ASSESSMENT 1. Diabetes Mellitus w/ peripheral neuropathy 2.  Hallux limitus/MTP capsulitis bilateral  PLAN OF CARE 1. Patient evaluated today. 2.  Continue management with PCP for diabetes management 3.  Injection of 0.5 cc Celestone Soluspan  injected the first MTP joint bilateral 4.  Continue good supportive shoes and sneakers 5.  Return to clinic next scheduled appointment for routine foot care  *Goes by Daryel November, DPM [facility]  Dr. Edrick Kins, DPM    [address], [place] [address]                Office [phone]  Fax [phone]

## 2021-10-08 ENCOUNTER — Encounter: Payer: Self-pay | Admitting: Podiatry

## 2021-10-12 DIAGNOSIS — M75111 Incomplete rotator cuff tear or rupture of right shoulder, not specified as traumatic: Secondary | ICD-10-CM | POA: Diagnosis not present

## 2021-10-13 ENCOUNTER — Telehealth: Payer: Self-pay | Admitting: Cardiology

## 2021-10-13 NOTE — Telephone Encounter (Signed)
   Pre-operative Risk Assessment    Patient Name: Harold [name]  DOB: July 03, 1965 MRN: [id number]      Request for Surgical Clearance    Procedure:   right shoulder scope  Date of Surgery:  Clearance 10/22/21                                 Surgeon:  Dr. Clementeen Graham Surgeon's Group or Practice Name:  [facility]  Phone number:  [phone] Fax number:  [phone]   Type of Clearance Requested:   - Medical  - Pharmacy:  Hold Aspirin TBD   Type of Anesthesia:  General    Additional requests/questions:      SignedMilbert Coulter   10/13/2021, 3:55 PM .

## 2021-10-13 NOTE — Telephone Encounter (Signed)
Spoke with the patient and reminded him of his upcoming appointment, he states that he will address issues at that time.

## 2021-10-15 NOTE — Telephone Encounter (Signed)
   Primary Cardiologist: Minus Breeding, MD  Chart reviewed as part of pre-operative protocol coverage. Given past medical history and time since last visit, based on ACC/AHA guidelines, Harold [name] would be at acceptable risk for the planned procedure without further cardiovascular testing.   Spoke with patient to verify no new concerning symptoms. He is holding aspirin starting today for a one week hold and is aware to restart as soon as possible following the procedure under the guidance of the surgeon.   I will route this recommendation to the requesting party via Epic fax function and remove from pre-op pool.  Please call with questions.  Emmaline Life, NP-C    10/15/2021, 11:11 AM [place] [address] Office [phone] Fax [phone]

## 2021-10-22 DIAGNOSIS — M75111 Incomplete rotator cuff tear or rupture of right shoulder, not specified as traumatic: Secondary | ICD-10-CM | POA: Diagnosis not present

## 2021-10-22 DIAGNOSIS — M7521 Bicipital tendinitis, right shoulder: Secondary | ICD-10-CM | POA: Diagnosis not present

## 2021-10-22 DIAGNOSIS — M67813 Other specified disorders of tendon, right shoulder: Secondary | ICD-10-CM | POA: Diagnosis not present

## 2021-10-22 DIAGNOSIS — M19011 Primary osteoarthritis, right shoulder: Secondary | ICD-10-CM | POA: Diagnosis not present

## 2021-10-22 DIAGNOSIS — G8918 Other acute postprocedural pain: Secondary | ICD-10-CM | POA: Diagnosis not present

## 2021-10-28 DIAGNOSIS — M25561 Pain in right knee: Secondary | ICD-10-CM | POA: Diagnosis not present

## 2021-10-28 DIAGNOSIS — M1711 Unilateral primary osteoarthritis, right knee: Secondary | ICD-10-CM | POA: Diagnosis not present

## 2021-10-28 DIAGNOSIS — S32009K Unspecified fracture of unspecified lumbar vertebra, subsequent encounter for fracture with nonunion: Secondary | ICD-10-CM | POA: Diagnosis not present

## 2021-10-28 DIAGNOSIS — G894 Chronic pain syndrome: Secondary | ICD-10-CM | POA: Diagnosis not present

## 2021-10-28 DIAGNOSIS — F1120 Opioid dependence, uncomplicated: Secondary | ICD-10-CM | POA: Diagnosis not present

## 2021-11-01 ENCOUNTER — Ambulatory Visit: Payer: Medicare HMO | Admitting: Podiatry

## 2021-11-01 DIAGNOSIS — M7752 Other enthesopathy of left foot: Secondary | ICD-10-CM | POA: Diagnosis not present

## 2021-11-01 DIAGNOSIS — M79675 Pain in left toe(s): Secondary | ICD-10-CM | POA: Diagnosis not present

## 2021-11-01 DIAGNOSIS — B351 Tinea unguium: Secondary | ICD-10-CM

## 2021-11-01 DIAGNOSIS — M79674 Pain in right toe(s): Secondary | ICD-10-CM | POA: Diagnosis not present

## 2021-11-01 DIAGNOSIS — M7751 Other enthesopathy of right foot: Secondary | ICD-10-CM | POA: Diagnosis not present

## 2021-11-01 MED ORDER — BETAMETHASONE SOD PHOS & ACET 6 (3-3) MG/ML IJ SUSP
3.0000 mg | Freq: Once | INTRAMUSCULAR | Status: AC
  Administered 2021-11-01: 3 mg via INTRA_ARTICULAR

## 2021-11-01 NOTE — Progress Notes (Signed)
   Chief Complaint  Patient presents with   diabetic foot care    [facility]    SUBJECTIVE Patient with a history of diabetes mellitus presents to office today complaining of elongated, thickened nails that cause pain while ambulating in shoes.  Patient is unable to trim their own nails.   Patient also states that the injection she received last visit to his first MTP bilateral helped significantly.  The pain is slowly returning.  This provided a few months of relief.  He is requesting injections today patient is here for further evaluation and treatment.   Past Medical History:  Diagnosis Date   Acute idiopathic pericarditis 04/08/2010   Qualifier: Diagnosis of  By: [name], [name]     Acute sinusitis 06/04/2014   Anxiety    CHF (congestive heart failure) ([place]) 06/29/2010   was pericardititis not CHF   Chronic back pain    Constipation    DDD (degenerative disc disease), lumbar 09/14/2012   Depression    Erectile dysfunction 09/14/2012   GERD (gastroesophageal reflux disease)    ocassional   Gout, unspecified 05/01/2007   Qualifier: Diagnosis of  By: [name] MD, Dellis Filbert A    Headache    migraines in the past   History of pancreatitis    a. admx 04-2009.Marland KitchenMarland Kitchen? 2-2 triglycerides   Hypertension    Hypertriglyceridemia    a. followed by [facility]   Left sided sciatica 09/14/2012   Neuropathy    Obesity    OSA (obstructive sleep apnea)    CPAP- not current   Restless legs    Type 2 diabetes mellitus with diabetic neuropathy, unspecified ([place]) 09/20/2019    OBJECTIVE General Patient is awake, alert, and oriented x 3 and in no acute distress. Derm Skin is dry and supple bilateral. Negative open lesions or macerations. Remaining integument unremarkable. Nails are tender, long, thickened and dystrophic with subungual debris, consistent with onychomycosis, 1-5 bilateral. No signs of infection noted. Vasc  DP and PT pedal pulses palpable bilaterally. Temperature gradient within normal limits.   Neuro Epicritic and protective threshold sensation diminished bilaterally.  Musculoskeletal Exam pain on palpation range of motion of the first MTP bilateral  ASSESSMENT 1. Diabetes Mellitus w/ peripheral neuropathy 2.  Pain due to onychomycosis of toenails bilateral 3.  Hallux limitus/MTP capsulitis bilateral  PLAN OF CARE 1. Patient evaluated today. 2. Instructed to maintain good pedal hygiene and foot care. Stressed importance of controlling blood sugar.  3. Mechanical debridement of nails 1-5 bilaterally performed using a nail nipper.  4.  Injection of 0.5 cc Celestone Soluspan injected in the first MTP bilateral  5.  Return to clinic in 3 mos. for routine foot care  *Goes by Daryel November, DPM [facility]  Dr. Edrick Kins, DPM    [address], [place] [address]                Office [phone]  Fax [phone]

## 2021-11-03 ENCOUNTER — Other Ambulatory Visit: Payer: Self-pay | Admitting: Internal Medicine

## 2021-11-11 ENCOUNTER — Other Ambulatory Visit: Payer: Self-pay

## 2021-11-11 MED ORDER — METFORMIN HCL ER 500 MG PO TB24: ORAL_TABLET | ORAL | 3 refills | Status: DC

## 2021-11-19 DIAGNOSIS — Z981 Arthrodesis status: Secondary | ICD-10-CM | POA: Diagnosis not present

## 2021-11-30 ENCOUNTER — Encounter: Payer: Self-pay | Admitting: Internal Medicine

## 2021-12-01 MED ORDER — ROSUVASTATIN CALCIUM 20 MG PO TABS: 20.0000 mg | ORAL_TABLET | Freq: Every day | ORAL | 3 refills | Status: DC

## 2021-12-03 DIAGNOSIS — M25561 Pain in right knee: Secondary | ICD-10-CM | POA: Diagnosis not present

## 2021-12-03 DIAGNOSIS — M1711 Unilateral primary osteoarthritis, right knee: Secondary | ICD-10-CM | POA: Diagnosis not present

## 2021-12-14 ENCOUNTER — Other Ambulatory Visit: Payer: Self-pay | Admitting: Internal Medicine

## 2021-12-14 ENCOUNTER — Encounter: Payer: Self-pay | Admitting: Internal Medicine

## 2021-12-14 MED ORDER — AZITHROMYCIN 250 MG PO TABS
ORAL_TABLET | ORAL | 1 refills | Status: AC
Start: 2021-12-14 — End: 2021-12-19

## 2021-12-14 MED ORDER — TOLTERODINE TARTRATE ER 4 MG PO CP24: 4.0000 mg | ORAL_CAPSULE | Freq: Every day | ORAL | 3 refills | Status: DC

## 2021-12-14 MED ORDER — CITALOPRAM HYDROBROMIDE 40 MG PO TABS: 40.0000 mg | ORAL_TABLET | Freq: Every day | ORAL | 3 refills | Status: DC

## 2021-12-17 ENCOUNTER — Other Ambulatory Visit: Payer: Self-pay

## 2021-12-17 MED ORDER — CITALOPRAM HYDROBROMIDE 40 MG PO TABS: 40.0000 mg | ORAL_TABLET | Freq: Every day | ORAL | 0 refills | Status: DC

## 2021-12-28 ENCOUNTER — Other Ambulatory Visit: Payer: Self-pay | Admitting: Cardiology

## 2021-12-29 ENCOUNTER — Other Ambulatory Visit: Payer: Self-pay

## 2022-01-03 ENCOUNTER — Telehealth: Payer: Self-pay | Admitting: Licensed Clinical Social Worker

## 2022-01-03 NOTE — Patient Outreach (Signed)
  Care Coordination   01/03/2022 Name: Harold [name] MRN: [id number] DOB: 1965-07-11   Care Coordination Outreach Attempts:  An unsuccessful telephone outreach was attempted today to offer the patient information about available care coordination services as a benefit of their health plan.   Follow Up Plan:  Additional outreach attempts will be made to offer the patient care coordination information and services.   Encounter Outcome:  No Answer  Care Coordination Interventions Activated:  No   Care Coordination Interventions:  No, not indicated    Casimer Lanius, [place] [phone]

## 2022-01-10 DIAGNOSIS — M25511 Pain in right shoulder: Secondary | ICD-10-CM | POA: Diagnosis not present

## 2022-02-07 ENCOUNTER — Ambulatory Visit (INDEPENDENT_AMBULATORY_CARE_PROVIDER_SITE_OTHER): Payer: Medicare HMO | Admitting: Podiatry

## 2022-02-07 DIAGNOSIS — M7751 Other enthesopathy of right foot: Secondary | ICD-10-CM | POA: Diagnosis not present

## 2022-02-07 DIAGNOSIS — M79675 Pain in left toe(s): Secondary | ICD-10-CM

## 2022-02-07 DIAGNOSIS — B351 Tinea unguium: Secondary | ICD-10-CM

## 2022-02-07 DIAGNOSIS — M79674 Pain in right toe(s): Secondary | ICD-10-CM

## 2022-02-07 DIAGNOSIS — M7752 Other enthesopathy of left foot: Secondary | ICD-10-CM

## 2022-02-07 MED ORDER — BETAMETHASONE SOD PHOS & ACET 6 (3-3) MG/ML IJ SUSP
3.0000 mg | Freq: Once | INTRAMUSCULAR | Status: AC
  Administered 2022-02-07: 3 mg via INTRA_ARTICULAR

## 2022-02-07 NOTE — Progress Notes (Signed)
   Chief Complaint  Patient presents with   foot care    Patient is here for diabetic foot care.     SUBJECTIVE Patient with a history of diabetes mellitus presents to office today complaining of elongated, thickened nails that cause pain while ambulating in shoes.  Patient is unable to trim their own nails.   Patient also states that the injection she received last visit to his first MTP bilateral helped significantly.  The pain is slowly returning.  This provided a few months of relief.  He is requesting injections today patient is here for further evaluation and treatment.   Past Medical History:  Diagnosis Date   Acute idiopathic pericarditis 04/08/2010   Qualifier: Diagnosis of  By: [name], [name]     Acute sinusitis 06/04/2014   Anxiety    CHF (congestive heart failure) ([place]) 06/29/2010   was pericardititis not CHF   Chronic back pain    Constipation    DDD (degenerative disc disease), lumbar 09/14/2012   Depression    Erectile dysfunction 09/14/2012   GERD (gastroesophageal reflux disease)    ocassional   Gout, unspecified 05/01/2007   Qualifier: Diagnosis of  By: [name] MD, Dellis Filbert A    Headache    migraines in the past   History of pancreatitis    a. admx 04-2009.Marland KitchenMarland Kitchen? 2-2 triglycerides   Hypertension    Hypertriglyceridemia    a. followed by [facility]   Left sided sciatica 09/14/2012   Neuropathy    Obesity    OSA (obstructive sleep apnea)    CPAP- not current   Restless legs    Type 2 diabetes mellitus with diabetic neuropathy, unspecified ([place]) 09/20/2019    OBJECTIVE General Patient is awake, alert, and oriented x 3 and in no acute distress. Derm Skin is dry and supple bilateral. Negative open lesions or macerations. Remaining integument unremarkable. Nails are tender, long, thickened and dystrophic with subungual debris, consistent with onychomycosis, 1-5 bilateral. No signs of infection noted. Vasc  DP and PT pedal pulses palpable bilaterally. Temperature  gradient within normal limits.  Neuro Epicritic and protective threshold sensation diminished bilaterally.  Musculoskeletal Exam pain on palpation range of motion of the first MTP bilateral  ASSESSMENT 1. Diabetes Mellitus w/ peripheral neuropathy 2.  Pain due to onychomycosis of toenails bilateral 3.  Hallux limitus/MTP capsulitis bilateral  PLAN OF CARE 1. Patient evaluated today. 2. Instructed to maintain good pedal hygiene and foot care. Stressed importance of controlling blood sugar.  3. Mechanical debridement of nails 1-5 bilaterally performed using a nail nipper.  4.  Injection of 0.5 cc Celestone Soluspan injected in the first MTP bilateral  5.  Return to clinic in 3 mos. for routine foot care  *Goes by Daryel November, DPM [facility]  Dr. Edrick Kins, DPM    [address], [place] [address]                Office [phone]  Fax [phone]

## 2022-02-23 DIAGNOSIS — M48062 Spinal stenosis, lumbar region with neurogenic claudication: Secondary | ICD-10-CM | POA: Diagnosis not present

## 2022-02-23 DIAGNOSIS — F1120 Opioid dependence, uncomplicated: Secondary | ICD-10-CM | POA: Diagnosis not present

## 2022-02-23 DIAGNOSIS — G894 Chronic pain syndrome: Secondary | ICD-10-CM | POA: Diagnosis not present

## 2022-02-23 DIAGNOSIS — M961 Postlaminectomy syndrome, not elsewhere classified: Secondary | ICD-10-CM | POA: Diagnosis not present

## 2022-03-01 DIAGNOSIS — M48062 Spinal stenosis, lumbar region with neurogenic claudication: Secondary | ICD-10-CM | POA: Diagnosis not present

## 2022-03-01 DIAGNOSIS — S32009K Unspecified fracture of unspecified lumbar vertebra, subsequent encounter for fracture with nonunion: Secondary | ICD-10-CM | POA: Diagnosis not present

## 2022-03-03 ENCOUNTER — Other Ambulatory Visit: Payer: Self-pay

## 2022-03-03 MED ORDER — FENOFIBRATE 145 MG PO TABS: 145.0000 mg | ORAL_TABLET | Freq: Every day | ORAL | 1 refills | Status: DC

## 2022-03-11 DIAGNOSIS — M1711 Unilateral primary osteoarthritis, right knee: Secondary | ICD-10-CM | POA: Diagnosis not present

## 2022-03-11 DIAGNOSIS — M25511 Pain in right shoulder: Secondary | ICD-10-CM | POA: Diagnosis not present

## 2022-03-23 ENCOUNTER — Other Ambulatory Visit: Payer: Self-pay | Admitting: Neurosurgery

## 2022-03-23 DIAGNOSIS — S32009K Unspecified fracture of unspecified lumbar vertebra, subsequent encounter for fracture with nonunion: Secondary | ICD-10-CM

## 2022-03-24 ENCOUNTER — Other Ambulatory Visit: Payer: Self-pay

## 2022-03-24 MED ORDER — HYDROCHLOROTHIAZIDE 12.5 MG PO CAPS: 12.5000 mg | ORAL_CAPSULE | Freq: Every day | ORAL | 0 refills | Status: DC

## 2022-04-04 ENCOUNTER — Ambulatory Visit
Admission: RE | Admit: 2022-04-04 | Discharge: 2022-04-04 | Disposition: A | Discharge status: Home / Self Care | Payer: Medicare HMO | Source: Ambulatory Visit | Attending: Neurosurgery | Admitting: Neurosurgery

## 2022-04-04 DIAGNOSIS — M48061 Spinal stenosis, lumbar region without neurogenic claudication: Secondary | ICD-10-CM | POA: Diagnosis not present

## 2022-04-04 DIAGNOSIS — S32009K Unspecified fracture of unspecified lumbar vertebra, subsequent encounter for fracture with nonunion: Secondary | ICD-10-CM

## 2022-04-04 DIAGNOSIS — M545 Low back pain, unspecified: Secondary | ICD-10-CM | POA: Diagnosis not present

## 2022-04-04 DIAGNOSIS — M4316 Spondylolisthesis, lumbar region: Secondary | ICD-10-CM | POA: Diagnosis not present

## 2022-04-05 ENCOUNTER — Ambulatory Visit (INDEPENDENT_AMBULATORY_CARE_PROVIDER_SITE_OTHER): Payer: Medicare HMO | Admitting: Internal Medicine

## 2022-04-05 ENCOUNTER — Encounter: Payer: Self-pay | Admitting: Internal Medicine

## 2022-04-05 VITALS — BP 162/92 | HR 59 | Temp 98.0°F | Ht 71.0 in | Wt 260.0 lb

## 2022-04-05 DIAGNOSIS — E1169 Type 2 diabetes mellitus with other specified complication: Secondary | ICD-10-CM

## 2022-04-05 DIAGNOSIS — F17200 Nicotine dependence, unspecified, uncomplicated: Secondary | ICD-10-CM | POA: Diagnosis not present

## 2022-04-05 DIAGNOSIS — Z0001 Encounter for general adult medical examination with abnormal findings: Secondary | ICD-10-CM | POA: Diagnosis not present

## 2022-04-05 DIAGNOSIS — N401 Enlarged prostate with lower urinary tract symptoms: Secondary | ICD-10-CM | POA: Diagnosis not present

## 2022-04-05 DIAGNOSIS — N529 Male erectile dysfunction, unspecified: Secondary | ICD-10-CM | POA: Diagnosis not present

## 2022-04-05 DIAGNOSIS — Z1159 Encounter for screening for other viral diseases: Secondary | ICD-10-CM

## 2022-04-05 DIAGNOSIS — Z125 Encounter for screening for malignant neoplasm of prostate: Secondary | ICD-10-CM | POA: Diagnosis not present

## 2022-04-05 DIAGNOSIS — E538 Deficiency of other specified B group vitamins: Secondary | ICD-10-CM | POA: Diagnosis not present

## 2022-04-05 DIAGNOSIS — J329 Chronic sinusitis, unspecified: Secondary | ICD-10-CM | POA: Diagnosis not present

## 2022-04-05 DIAGNOSIS — E785 Hyperlipidemia, unspecified: Secondary | ICD-10-CM

## 2022-04-05 DIAGNOSIS — E559 Vitamin D deficiency, unspecified: Secondary | ICD-10-CM

## 2022-04-05 DIAGNOSIS — I10 Essential (primary) hypertension: Secondary | ICD-10-CM

## 2022-04-05 DIAGNOSIS — N400 Enlarged prostate without lower urinary tract symptoms: Secondary | ICD-10-CM | POA: Insufficient documentation

## 2022-04-05 LAB — MICROALBUMIN / CREATININE URINE RATIO
Creatinine,U: 104.8 mg/dL
Microalb Creat Ratio: 3 mg/g (ref 0.0–30.0)
Microalb, Ur: 3.1 mg/dL — ABNORMAL HIGH (ref 0.0–1.9)

## 2022-04-05 LAB — HEPATIC FUNCTION PANEL
ALT: 15 U/L (ref 0–53)
AST: 17 U/L (ref 0–37)
Albumin: 4.7 g/dL (ref 3.5–5.2)
Alkaline Phosphatase: 65 U/L (ref 39–117)
Bilirubin, Direct: 0.1 mg/dL (ref 0.0–0.3)
Total Bilirubin: 0.4 mg/dL (ref 0.2–1.2)
Total Protein: 7.2 g/dL (ref 6.0–8.3)

## 2022-04-05 LAB — VITAMIN D 25 HYDROXY (VIT D DEFICIENCY, FRACTURES): VITD: 38.07 ng/mL (ref 30.00–100.00)

## 2022-04-05 LAB — LIPID PANEL
Cholesterol: 192 mg/dL (ref 0–200)
HDL: 39.4 mg/dL (ref >39.00)
NonHDL: 152.66
Total CHOL/HDL Ratio: 5
Triglycerides: 229 mg/dL — ABNORMAL HIGH (ref 0.0–149.0)
VLDL: 45.8 mg/dL — ABNORMAL HIGH (ref 0.0–40.0)

## 2022-04-05 LAB — CBC WITH DIFFERENTIAL/PLATELET
Basophils Absolute: 0.1 10*3/uL (ref 0.0–0.1)
Basophils Relative: 0.7 % (ref 0.0–3.0)
Eosinophils Absolute: 0.3 10*3/uL (ref 0.0–0.7)
Eosinophils Relative: 3.2 % (ref 0.0–5.0)
HCT: 45.9 % (ref 39.0–52.0)
Hemoglobin: 14.9 g/dL (ref 13.0–17.0)
Lymphocytes Relative: 24.1 % (ref 12.0–46.0)
Lymphs Abs: 2.1 10*3/uL (ref 0.7–4.0)
MCHC: 32.4 g/dL (ref 30.0–36.0)
MCV: 96.4 fl (ref 78.0–100.0)
Monocytes Absolute: 0.9 10*3/uL (ref 0.1–1.0)
Monocytes Relative: 10.8 % (ref 3.0–12.0)
Neutro Abs: 5.3 10*3/uL (ref 1.4–7.7)
Neutrophils Relative %: 61.2 % (ref 43.0–77.0)
Platelets: 422 10*3/uL — ABNORMAL HIGH (ref 150.0–400.0)
RBC: 4.76 Mil/uL (ref 4.22–5.81)
RDW: 14.7 % (ref 11.5–15.5)
WBC: 8.7 10*3/uL (ref 4.0–10.5)

## 2022-04-05 LAB — BASIC METABOLIC PANEL
BUN: 13 mg/dL (ref 6–23)
CO2: 28 mEq/L (ref 19–32)
Calcium: 10 mg/dL (ref 8.4–10.5)
Chloride: 101 mEq/L (ref 96–112)
Creatinine, Ser: 0.88 mg/dL (ref 0.40–1.50)
GFR: 97.36 mL/min (ref >60.00)
Glucose, Bld: 97 mg/dL (ref 70–99)
Potassium: 4.1 mEq/L (ref 3.5–5.1)
Sodium: 139 mEq/L (ref 135–145)

## 2022-04-05 LAB — TSH: TSH: 0.5 u[IU]/mL (ref 0.35–5.50)

## 2022-04-05 LAB — LDL CHOLESTEROL, DIRECT: Direct LDL: 124 mg/dL

## 2022-04-05 LAB — HEMOGLOBIN A1C: Hgb A1c MFr Bld: 6.9 % — ABNORMAL HIGH (ref 4.6–6.5)

## 2022-04-05 LAB — PSA: PSA: 1.73 ng/mL (ref 0.10–4.00)

## 2022-04-05 LAB — VITAMIN B12: Vitamin B-12: 763 pg/mL (ref 211–911)

## 2022-04-05 MED ORDER — TOLTERODINE TARTRATE ER 4 MG PO CP24: 4.0000 mg | ORAL_CAPSULE | Freq: Every day | ORAL | 3 refills | Status: AC

## 2022-04-05 MED ORDER — METFORMIN HCL ER 500 MG PO TB24: ORAL_TABLET | ORAL | 3 refills | Status: DC

## 2022-04-05 MED ORDER — TADALAFIL 20 MG PO TABS: 10.0000 mg | ORAL_TABLET | ORAL | 11 refills | Status: DC | PRN

## 2022-04-05 MED ORDER — AMLODIPINE BESYLATE 10 MG PO TABS: 10.0000 mg | ORAL_TABLET | Freq: Every day | ORAL | 3 refills | Status: DC

## 2022-04-05 MED ORDER — DEXCOM G7 SENSOR MISC: 3 refills | Status: AC

## 2022-04-05 MED ORDER — TIRZEPATIDE 2.5 MG/0.5ML ~~LOC~~ SOAJ: 2.5000 mg | SUBCUTANEOUS | 3 refills | Status: DC

## 2022-04-05 MED ORDER — DOXYCYCLINE HYCLATE 100 MG PO TABS: 100.0000 mg | ORAL_TABLET | Freq: Two times a day (BID) | ORAL | 0 refills | Status: DC

## 2022-04-05 MED ORDER — POTASSIUM CHLORIDE ER 20 MEQ PO TBCR: 1.0000 | EXTENDED_RELEASE_TABLET | Freq: Every day | ORAL | 3 refills | Status: DC

## 2022-04-05 MED ORDER — DEXCOM G7 RECEIVER DEVI: 0 refills | Status: AC

## 2022-04-05 MED ORDER — LISINOPRIL 40 MG PO TABS: ORAL_TABLET | ORAL | 3 refills | Status: DC

## 2022-04-05 MED ORDER — FENOFIBRATE 145 MG PO TABS: 145.0000 mg | ORAL_TABLET | Freq: Every day | ORAL | 3 refills | Status: DC

## 2022-04-05 MED ORDER — CITALOPRAM HYDROBROMIDE 40 MG PO TABS: 40.0000 mg | ORAL_TABLET | Freq: Every day | ORAL | 3 refills | Status: DC

## 2022-04-05 MED ORDER — ROSUVASTATIN CALCIUM 20 MG PO TABS: 20.0000 mg | ORAL_TABLET | Freq: Every day | ORAL | 3 refills | Status: DC

## 2022-04-05 MED ORDER — HYDROCHLOROTHIAZIDE 12.5 MG PO CAPS: 12.5000 mg | ORAL_CAPSULE | Freq: Every day | ORAL | 3 refills | Status: AC

## 2022-04-05 MED ORDER — TAMSULOSIN HCL 0.4 MG PO CAPS: 0.4000 mg | ORAL_CAPSULE | Freq: Every day | ORAL | 3 refills | Status: DC

## 2022-04-05 NOTE — Assessment & Plan Note (Signed)
BP Readings from Last 3 Encounters:  04/05/22 (!) 162/92  08/31/21 130/72  06/12/21 109/61   Uncontrolled, likely reactive, pt to continue medical treatment norvasc 10 qd, hct 12.5 qd

## 2022-04-05 NOTE — Assessment & Plan Note (Signed)
Mild to mod, for antibx course doxycyline 100 bid,,  to f/u any worsening symptoms or concerns

## 2022-04-05 NOTE — Progress Notes (Signed)
Patient ID: Harold [name], male   DOB: 11/08/1965, 57 y.o.   MRN: [id number]         Chief Complaint:: wellness exam and over active bladder, boil under left arm, and Sinus Problem (Headache no cold symptoms)  , htn, bph, ED, DM       HPI:  Harold [name] is a 57 y.o. male here for wellness exam; for eye exam next week; declines flu shot, covid booster, but may consider shingrix at the pharmacy, o/w up to date.  Still smoking, not ready to quit.                          Also conts to see pain management for ongoing knee, lower back, left shoulder and neck chronic pain  Denies urinary symptoms such as dysuria, frequency, urgency, flank pain, hematuria or n/v, fever, chills. But has recent wosening ED symptoms, as well as urinary hesitance and slow stream.  Pt denies chest pain, increased sob or doe, wheezing, orthopnea, PND, increased LE swelling, palpitations, dizziness or syncope.   Pt denies polydipsia, polyuria, or new focal neuro s/s.    Pt denies fever, wt loss, night sweats, loss of appetite, or other constitutional symptoms   Here with 2-3 days acute onset fever, facial pain, pressure, headache, general weakness and malaise, and greenish d/c, with mild ST and cough, as well as tender lump area to left axilla.    Wt Readings from Last 3 Encounters:  04/05/22 260 lb (117.9 kg)  08/31/21 260 lb 3.2 oz (118 kg)  06/11/21 258 lb (117 kg)   BP Readings from Last 3 Encounters:  04/05/22 (!) 162/92  08/31/21 130/72  06/12/21 109/61   Immunization History  Administered Date(s) Administered   Hepatitis B 03/29/2003, 06/27/2003, 09/26/2003   Influenza Split 01/27/2011   Influenza,inj,Quad PF,6+ Mos 03/08/2013, 11/19/2013, 01/21/2015, 12/20/2016, 01/26/2018, 01/07/2019   Influenza-Unspecified 11/21/2018, 12/26/2019   PFIZER(Purple Top)SARS-COV-2 Vaccination 06/08/2019, 06/29/2019   PNEUMOCOCCAL CONJUGATE-20 10/12/2020   Pneumococcal Polysaccharide-23 03/24/2010, 03/04/2015   Td 03/28/2001    Tdap 09/14/2012, 10/05/2017   Health Maintenance Due  Topic Date Due   HEMOGLOBIN A1C  12/11/2021   Diabetic kidney evaluation - Urine ACR  03/08/2022      Past Medical History:  Diagnosis Date   Acute idiopathic pericarditis 04/08/2010   Qualifier: Diagnosis of  By: [name], [name]     Acute sinusitis 06/04/2014   Anxiety    CHF (congestive heart failure) ([place]) 06/29/2010   was pericardititis not CHF   Chronic back pain    Constipation    DDD (degenerative disc disease), lumbar 09/14/2012   Depression    Erectile dysfunction 09/14/2012   GERD (gastroesophageal reflux disease)    ocassional   Gout, unspecified 05/01/2007   Qualifier: Diagnosis of  By: [name] MD, Dellis Filbert A    Headache    migraines in the past   History of pancreatitis    a. admx 04-2009.Marland KitchenMarland Kitchen? 2-2 triglycerides   Hypertension    Hypertriglyceridemia    a. followed by [facility]   Left sided sciatica 09/14/2012   Neuropathy    Obesity    OSA (obstructive sleep apnea)    CPAP- not current   Restless legs    Type 2 diabetes mellitus with diabetic neuropathy, unspecified ([place]) 09/20/2019   Past Surgical History:  Procedure Laterality Date   ANTERIOR CERVICAL DECOMP/DISCECTOMY FUSION N/A 09/14/2020   Procedure: Anterior Cervical Decompression Fusion - Cervical  three-Cervical four;  Surgeon: Kary Kos, MD;  Location: [place];  Service: Neurosurgery;  Laterality: N/A;   COLONOSCOPY     ELBOW SURGERY Left    KNEE SURGERY Right    x3 scopes   KNEE SURGERY Right    open menisicus repair   LUMBAR LAMINECTOMY/DECOMPRESSION MICRODISCECTOMY N/A 08/26/2015   Procedure: Lumbar Two-Sacral One  Laminectomy for decompression;  Surgeon: [name], MD;  Location: [place] [facility];  Service: Neurosurgery;  Laterality: N/A;   pericardectomy  07/15/2010   Dr. [name]   pleurx catheter placement  07/15/2010   Dr. [name]   SHOULDER SURGERY     right and left shoulders- Rotator cuff repair   TONSILLECTOMY AND  ADENOIDECTOMY     one tonsil    reports that he has been smoking cigarettes. He has a 11.88 pack-year smoking history. His smokeless tobacco use includes snuff. He reports current alcohol use. He reports that he does not use drugs. family history includes Colon cancer (age of onset: 45) in his father; Diabetes in his father, mother, and sister; Heart disease (age of onset: 68) in his father; Hypertension in an other family member; Liver cancer in his paternal uncle; Pancreatitis in his father and paternal uncle; Stomach cancer in his maternal grandfather. Allergies  Allergen Reactions   Other Itching, Swelling and Palpitations    Pecans: itching and swelling of the tongue    Diphenhydramine Itching, Palpitations and Other (See Comments)    "jittery" Pt states he can take Benadryl in an emergency situation   Beta Adrenergic Blockers Other (See Comments)    Erectile dysfxn   Pecan Nut (Diagnostic)    Colchicine Diarrhea   Lipitor [Atorvastatin] Other (See Comments)    Muscle cramps   Septra [Sulfamethoxazole-Trimethoprim] Itching   Current Outpatient Medications on File Prior to Visit  Medication Sig Dispense Refill   ascorbic acid (VITAMIN C) 500 MG tablet Take 500 mg by mouth daily.     aspirin EC 81 MG tablet Take 81 mg by mouth daily.     Blood Glucose Monitoring Suppl (ONE TOUCH ULTRA 2) w/Device KIT Use as directed daily E11.9 1 each 0   Cholecalciferol (VITAMIN D3 PO) Take 1 capsule by mouth daily.     citalopram (CELEXA) 40 MG tablet TAKE 1 TABLET(40 MG) BY MOUTH DAILY (Patient taking differently: Take 40 mg by mouth daily.) 90 tablet 1   cyclobenzaprine (FLEXERIL) 10 MG tablet Take 1 tablet (10 mg total) by mouth 3 (three) times daily as needed for muscle spasms. 30 tablet 2   diclofenac Sodium (VOLTAREN) 1 % GEL Apply 1 application topically 3 (three) times daily as needed (PAIN). 150 g 0   docusate sodium (COLACE) 100 MG capsule Take 1 capsule (100 mg total) by mouth 2 (two)  times daily. Purchase over the counter (Patient taking differently: Take 100 mg by mouth daily as needed for mild constipation. Purchase over the counter) 60 capsule 0   fluconazole (DIFLUCAN) 100 MG tablet Take 1 tablet (100 mg total) by mouth daily. For 7 days 7 tablet 0   fluticasone (FLONASE) 50 MCG/ACT nasal spray Place 2 sprays into both nostrils daily. 16 g 6   gabapentin (NEURONTIN) 300 MG capsule Take 900 mg by mouth 3 (three) times daily.     glucose blood test strip Use as instructed once daily E119 100 each 12   ibuprofen (ADVIL) 800 MG tablet Take 800 mg by mouth 3 (three) times daily.     Lancets  MISC Use as directed daily E11.9 100 each 11   lidocaine (LIDODERM) 5 % Place 1 patch onto the skin daily. Can apply to back on at 8 am and off at 8 pm --has to be off for 12 hours. Purchase over the counter. (Patient taking differently: Place 1 patch onto the skin daily as needed (pain). Can apply to back on at 8 am and off at 8 pm --has to be off for 12 hours. Purchase over the counter.) 30 patch 0   loratadine (CLARITIN) 10 MG tablet TAKE 1 TABLET(10 MG) BY MOUTH DAILY AS NEEDED FOR ALLERGIES 90 tablet 3   magnesium oxide (MAG-OX) 400 MG tablet Take 400 mg by mouth daily.     Multiple Minerals-Vitamins (CALCIUM-MAGNESIUM-ZINC-D3) TABS Take 1 tablet by mouth daily.     oxyCODONE (OXY IR/ROXICODONE) 5 MG immediate release tablet Take 5 mg by mouth every 6 (six) hours as needed.     oxyCODONE-acetaminophen (PERCOCET) 10-325 MG tablet Take 1 tablet by mouth every 4 (four) hours as needed for pain. 120 tablet 0   tiZANidine (ZANAFLEX) 4 MG tablet Take 4 mg by mouth 3 (three) times daily as needed.     vitamin B-12 (CYANOCOBALAMIN) 1000 MCG tablet Take 1 tablet (1,000 mcg total) by mouth daily. 90 tablet 1   zinc gluconate 50 MG tablet Take 50 mg by mouth daily.     No current facility-administered medications on file prior to visit.        ROS:  All others reviewed and negative.  Objective         PE:  BP (!) 162/92 (BP Location: Left Arm, Patient Position: Sitting, Cuff Size: Large)   Pulse (!) 59   Temp 98 F (36.7 C) (Oral)   Ht '5\' 11"'$  (1.803 m)   Wt 260 lb (117.9 kg)   SpO2 93%   BMI 36.26 kg/m                 Constitutional: Pt appears in NAD               HENT: Head: NCAT.                Right Ear: External ear normal.                 Left Ear: External ear normal.                Eyes: . Pupils are equal, round, and reactive to light. Conjunctivae and EOM are normal               Nose: without d/c or deformity               Neck: Neck supple. Gross normal ROM               Cardiovascular: Normal rate and regular rhythm.                 Pulmonary/Chest: Effort normal and breath sounds without rales or wheezing.                Abd:  Soft, NT, ND, + BS, no organomegaly               Neurological: Pt is alert. At baseline orientation, motor grossly intact               Skin: Skin is warm. No rashes, no other new lesions, LE edema - none  Psychiatric: Pt behavior is normal without agitation   Micro: none  Cardiac tracings I have personally interpreted today:  none  Pertinent Radiological findings (summarize): none   Lab Results  Component Value Date   WBC 12.4 (H) 06/10/2021   HGB 13.9 06/10/2021   HCT 43.5 06/10/2021   PLT 471 (H) 06/10/2021   GLUCOSE 98 06/10/2021   CHOL 180 03/08/2021   TRIG 338.0 (H) 03/08/2021   HDL 23.50 (L) 03/08/2021   LDLDIRECT 121.0 03/08/2021   LDLCALC 85 08/07/2019   ALT 13 03/08/2021   AST 15 03/08/2021   NA 139 06/10/2021   K 4.3 06/10/2021   CL 102 06/10/2021   CREATININE 0.79 06/10/2021   BUN 9 06/10/2021   CO2 25 06/10/2021   TSH 0.60 03/08/2021   PSA 2.45 03/08/2021   INR 1.72 (H) 07/05/2010   HGBA1C 6.4 (H) 06/10/2021   MICROALBUR 1.9 03/08/2021   Assessment/Plan:  Harold [name] is a 57 y.o. Black or African American [2] male with  has a past medical history of Acute idiopathic pericarditis  (04/08/2010), Acute sinusitis (06/04/2014), Anxiety, CHF (congestive heart failure) ([place]) (06/29/2010), Chronic back pain, Constipation, DDD (degenerative disc disease), lumbar (09/14/2012), Depression, Erectile dysfunction (09/14/2012), GERD (gastroesophageal reflux disease), Gout, unspecified (05/01/2007), Headache, History of pancreatitis, Hypertension, Hypertriglyceridemia, Left sided sciatica (09/14/2012), Neuropathy, Obesity, OSA (obstructive sleep apnea), Restless legs, and Type 2 diabetes mellitus with diabetic neuropathy, unspecified ([place]) (09/20/2019).  Encounter for well adult exam with abnormal findings Age and sex appropriate education and counseling updated with regular exercise and diet Referrals for preventative services - has sched eye exam next wk Immunizations addressed - declines flu and covid booster, but may have shingrix at pharmacy Smoking counseling  - counsled to quit, pt not ready Evidence for depression or other mood disorder - none significant Most recent labs reviewed. I have personally reviewed and have noted: 1) the patient's medical and social history 2) The patient's current medications and supplements 3) The patient's height, weight, and BMI have been recorded in the chart   Essential hypertension BP Readings from Last 3 Encounters:  04/05/22 (!) 162/92  08/31/21 130/72  06/12/21 109/61   Uncontrolled, likely reactive, pt to continue medical treatment norvasc 10 qd, hct 12.5 qd   Hyperlipidemia Lab Results  Component Value Date   LDLCALC 85 08/07/2019   uncontrolled, pt to continue current statin crestor 20 mg, , and tricor 145 mg qd, for f/u lab today  Type 2 diabetes mellitus ([place]) Lab Results  Component Value Date   HGBA1C 6.4 (H) 06/10/2021   Stable, pt to continue current medical treatment mounjaro 2.5 weekly, metformin ER 500 mg - 2 qd   BPH (benign prostatic hyperplasia) With mild worsening symptoms, for flomax 0/4 qd  Smoker Pt counsled to  quit, pt not ready  Sinusitis Mild to mod, for antibx course doxycyline 100 bid,,  to f/u any worsening symptoms or concerns  Erectile dysfunction For cialis 20 mg prn,  to f/u any worsening symptoms or concerns  Followup: Return in about 6 months (around 10/04/2022).  Cathlean Cower, MD 04/05/2022 8:50 PM [facility]

## 2022-04-05 NOTE — Assessment & Plan Note (Signed)
Pt counsled to quit, pt not ready °

## 2022-04-05 NOTE — Patient Instructions (Addendum)
Please take all new medication as prescribed - the cialis and flomax, and the mounjaro, and antibiotic for sinusitis  We have sent the prescriptions for the Dexcom G7  Please continue all other medications as before, and refills have been done if requested.  Please have the pharmacy call with any other refills you may need.  Please continue your efforts at being more active, low cholesterol diet, and weight control.  You are otherwise up to date with prevention measures today.  Please keep your appointments with your specialists as you may have planned  Please go to the LAB at the blood drawing area for the tests to be done  You will be contacted by phone if any changes need to be made immediately.  Otherwise, you will receive a letter about your results with an explanation, but please check with MyChart first.  Please remember to sign up for MyChart if you have not done so, as this will be important to you in the future with finding out test results, communicating by private email, and scheduling acute appointments online when needed.  Please make an Appointment to return in 6 months, or sooner if needed

## 2022-04-05 NOTE — Assessment & Plan Note (Signed)
For cialis 20 mg prn,  to f/u any worsening symptoms or concerns

## 2022-04-05 NOTE — Assessment & Plan Note (Signed)
Lab Results  Component Value Date   HGBA1C 6.4 (H) 06/10/2021   Stable, pt to continue current medical treatment mounjaro 2.5 weekly, metformin ER 500 mg - 2 qd

## 2022-04-05 NOTE — Assessment & Plan Note (Signed)
Lab Results  Component Value Date   LDLCALC 85 08/07/2019   uncontrolled, pt to continue current statin crestor 20 mg, , and tricor 145 mg qd, for f/u lab today

## 2022-04-05 NOTE — Assessment & Plan Note (Signed)
Age and sex appropriate education and counseling updated with regular exercise and diet Referrals for preventative services - has sched eye exam next wk Immunizations addressed - declines flu and covid booster, but may have shingrix at pharmacy Smoking counseling  - counsled to quit, pt not ready Evidence for depression or other mood disorder - none significant Most recent labs reviewed. I have personally reviewed and have noted: 1) the patient's medical and social history 2) The patient's current medications and supplements 3) The patient's height, weight, and BMI have been recorded in the chart

## 2022-04-05 NOTE — Assessment & Plan Note (Signed)
With mild worsening symptoms, for flomax 0.4 qd ?

## 2022-04-06 ENCOUNTER — Encounter: Payer: Self-pay | Admitting: Internal Medicine

## 2022-04-06 LAB — URINALYSIS, ROUTINE W REFLEX MICROSCOPIC
Bilirubin Urine: NEGATIVE
Hgb urine dipstick: NEGATIVE
Ketones, ur: NEGATIVE
Leukocytes,Ua: NEGATIVE
Nitrite: NEGATIVE
RBC / HPF: NONE SEEN (ref 0–?)
Specific Gravity, Urine: 1.02 (ref 1.000–1.030)
Total Protein, Urine: NEGATIVE
Urine Glucose: NEGATIVE
Urobilinogen, UA: 1 (ref 0.0–1.0)
pH: 6.5 (ref 5.0–8.0)

## 2022-04-07 NOTE — Telephone Encounter (Signed)
Pt needing PA on his Dexcom.Marland KitchenJohny [name]

## 2022-04-11 NOTE — Telephone Encounter (Signed)
Patient Advocate Encounter   Received notification from [name] that prior authorization for Dexcom Sensors is required.   PA submitted on 04/11/2022 Key [id number] Status is pending

## 2022-04-11 NOTE — Telephone Encounter (Signed)
Patient Advocate Encounter   Received notification from [facility] that prior authorization for [facility] G7 Receiver is required.   PA submitted on 04/11/2022 Key [id number] Status is pending

## 2022-04-13 DIAGNOSIS — Z6836 Body mass index (BMI) 36.0-36.9, adult: Secondary | ICD-10-CM | POA: Diagnosis not present

## 2022-04-13 DIAGNOSIS — S32009K Unspecified fracture of unspecified lumbar vertebra, subsequent encounter for fracture with nonunion: Secondary | ICD-10-CM | POA: Diagnosis not present

## 2022-04-20 ENCOUNTER — Telehealth: Payer: Self-pay | Admitting: *Deleted

## 2022-04-20 ENCOUNTER — Other Ambulatory Visit: Payer: Self-pay | Admitting: Neurosurgery

## 2022-04-20 NOTE — Telephone Encounter (Signed)
   Pre-operative Risk Assessment    Patient Name: Harold [name]  DOB: Jul 28, 1965 MRN: [id number]      Request for Surgical Clearance    Procedure:   L3-4, L4-5 LUMBAR FUSION WITH REMOVAL OF HARDWARE  Date of Surgery:  Clearance 05/09/22                                 Surgeon:  DR. Kary Kos Surgeon's Group or Practice Name:  [facility] Phone number:  [phone] Fax number:  [phone] ATTN: VANESSA EXT [id number]   Type of Clearance Requested:   - Medical ; ASA    Type of Anesthesia:  General    Additional requests/questions:    Jiles Prows   04/20/2022, 1:29 PM

## 2022-04-20 NOTE — Telephone Encounter (Signed)
Pt has been scheduled for tele pre op appt 04/27/22 @ 10 am. Med rec and consent are done.     Patient Consent for Virtual Visit        Harold [name] has provided verbal consent on 04/20/2022 for a virtual visit (video or telephone).   CONSENT FOR VIRTUAL VISIT FOR:  Harold [name]  By participating in this virtual visit I agree to the following:  I hereby voluntarily request, consent and authorize [facility] and its employed or contracted physicians, physician assistants, nurse practitioners or other licensed health care professionals (the Practitioner), to provide me with telemedicine health care services (the "Services") as deemed necessary by the treating Practitioner. I acknowledge and consent to receive the Services by the Practitioner via telemedicine. I understand that the telemedicine visit will involve communicating with the Practitioner through live audiovisual communication technology and the disclosure of certain medical information by electronic transmission. I acknowledge that I have been given the opportunity to request an in-person assessment or other available alternative prior to the telemedicine visit and am voluntarily participating in the telemedicine visit.  I understand that I have the right to withhold or withdraw my consent to the use of telemedicine in the course of my care at any time, without affecting my right to future care or treatment, and that the Practitioner or I may terminate the telemedicine visit at any time. I understand that I have the right to inspect all information obtained and/or recorded in the course of the telemedicine visit and may receive copies of available information for a reasonable fee.  I understand that some of the potential risks of receiving the Services via telemedicine include:  Delay or interruption in medical evaluation due to technological equipment failure or disruption; Information transmitted may not be sufficient (e.g.  poor resolution of images) to allow for appropriate medical decision making by the Practitioner; and/or  In rare instances, security protocols could fail, causing a breach of personal health information.  Furthermore, I acknowledge that it is my responsibility to provide information about my medical history, conditions and care that is complete and accurate to the best of my ability. I acknowledge that Practitioner's advice, recommendations, and/or decision may be based on factors not within their control, such as incomplete or inaccurate data provided by me or distortions of diagnostic images or specimens that may result from electronic transmissions. I understand that the practice of medicine is not an exact science and that Practitioner makes no warranties or guarantees regarding treatment outcomes. I acknowledge that a copy of this consent can be made available to me via my patient portal ([facility]), or I can request a printed copy by calling the office of [facility].    I understand that my insurance will be billed for this visit.   I have read or had this consent read to me. I understand the contents of this consent, which adequately explains the benefits and risks of the Services being provided via telemedicine.  I have been provided ample opportunity to ask questions regarding this consent and the Services and have had my questions answered to my satisfaction. I give my informed consent for the services to be provided through the use of telemedicine in my medical care

## 2022-04-20 NOTE — Telephone Encounter (Signed)
Pt has been scheduled for tele pre op appt 04/27/22 @ 10 am. Med rec and consent are done.

## 2022-04-20 NOTE — Telephone Encounter (Signed)
   Name: Harold [name]  DOB: 01-26-1966  MRN: [id number]  Primary Cardiologist: Minus Breeding, MD   Preoperative team, please contact this patient and set up a phone call appointment for further preoperative risk assessment. Please obtain consent and complete medication review. Thank you for your help.  I confirm that guidance regarding antiplatelet and oral anticoagulation therapy has been completed and, if necessary, noted below.  Per office protocol, if patient is without any new symptoms or concerns at the time of their virtual visit, he may hold Aspirin for 5-7 days prior to procedure. Please resume Aspirin as soon as possible postprocedure, at the discretion of the surgeon.    Lenna Sciara, NP 04/20/2022, 1:34 PM [place]

## 2022-04-22 DIAGNOSIS — M25511 Pain in right shoulder: Secondary | ICD-10-CM | POA: Diagnosis not present

## 2022-04-22 DIAGNOSIS — M1711 Unilateral primary osteoarthritis, right knee: Secondary | ICD-10-CM | POA: Diagnosis not present

## 2022-04-24 NOTE — Progress Notes (Signed)
Virtual Visit via Telephone Note   Because of Harold [name] co-morbid illnesses, he is at least at moderate risk for complications without adequate follow up.  This format is felt to be most appropriate for this patient at this time.  The patient did not have access to video technology/had technical difficulties with video requiring transitioning to audio format only (telephone).  All issues noted in this document were discussed and addressed.  No physical exam could be performed with this format.  Please refer to the patient's chart for his consent to telehealth for [facility].  Evaluation Performed:  Preoperative cardiovascular risk assessment _____________   Date:  04/27/2022   Patient ID:  Harold [name], DOB Aug 01, 1965, MRN [id number] Patient Location:  Home Provider location:   Office  Primary Care Provider:  Corwin Levins, MD Primary Cardiologist:  Rollene Rotunda, MD  Chief Complaint / Patient Profile   57 y.o. y/o male with a h/o hypertensive response to exercise, pericarditis, HTN, type 2 DM, hyperlipidemia, OSA, obesity, tobacco abuse who is pending L3-4, L4-5 lumbar fusion with removal of hardware and presents today for telephonic preoperative cardiovascular risk assessment.  History of Present Illness    Harold [name] is a 57 y.o. male who presents via audio/video conferencing for a telehealth visit today.  Pt was last seen in cardiology clinic on 08/31/21 by Dr. Antoine Poche.  At that time Harold [name] was doing well.  The patient is now pending procedure as outlined above. Since his last visit, he denies chest pain, shortness of breath, lower extremity edema, fatigue, palpitations, melena, hematuria, hemoptysis, diaphoresis, weakness, presyncope, syncope, orthopnea, and PND. He is limited by back pain but does not experience any chest discomfort or increased shortness of breath with exertion. He is able to achieve > 4 METS activity without concerning cardiac  symptoms.  Past Medical History    Past Medical History:  Diagnosis Date   Acute idiopathic pericarditis 04/08/2010   Qualifier: Diagnosis of  By: Huntley Dec, [name]     Acute sinusitis 06/04/2014   Anxiety    CHF (congestive heart failure) (HCC) 06/29/2010   was pericardititis not CHF   Chronic back pain    Constipation    DDD (degenerative disc disease), lumbar 09/14/2012   Depression    Erectile dysfunction 09/14/2012   GERD (gastroesophageal reflux disease)    ocassional   Gout, unspecified 05/01/2007   Qualifier: Diagnosis of  By: Tawanna Cooler MD, Tinnie Gens A    Headache    migraines in the past   History of pancreatitis    a. admx 04-2009.Marland KitchenMarland Kitchen? 2-2 triglycerides   Hypertension    Hypertriglyceridemia    a. followed by [facility]   Left sided sciatica 09/14/2012   Neuropathy    Obesity    OSA (obstructive sleep apnea)    CPAP- not current   Restless legs    Type 2 diabetes mellitus with diabetic neuropathy, unspecified (HCC) 09/20/2019   Past Surgical History:  Procedure Laterality Date   ANTERIOR CERVICAL DECOMP/DISCECTOMY FUSION N/A 09/14/2020   Procedure: Anterior Cervical Decompression Fusion - Cervical three-Cervical four;  Surgeon: Donalee Citrin, MD;  Location: [facility] OR;  Service: Neurosurgery;  Laterality: N/A;   COLONOSCOPY     ELBOW SURGERY Left    KNEE SURGERY Right    x3 scopes   KNEE SURGERY Right    open menisicus repair   LUMBAR LAMINECTOMY/DECOMPRESSION MICRODISCECTOMY N/A 08/26/2015   Procedure: Lumbar Two-Sacral One  Laminectomy for  decompression;  Surgeon: Loura Halt Ditty, MD;  Location: [facility];  Service: Neurosurgery;  Laterality: N/A;   pericardectomy  07/15/2010   Dr. Tyrone Sage   pleurx catheter placement  07/15/2010   Dr. Tyrone Sage   SHOULDER SURGERY     right and left shoulders- Rotator cuff repair   TONSILLECTOMY AND ADENOIDECTOMY     one tonsil    Allergies  Allergies  Allergen Reactions   Other Itching, Swelling and Palpitations    Pecans:  itching and swelling of the tongue    Diphenhydramine Itching, Palpitations and Other (See Comments)    "jittery" Pt states he can take Benadryl in an emergency situation   Beta Adrenergic Blockers Other (See Comments)    Erectile dysfxn   Pecan Nut (Diagnostic)    Colchicine Diarrhea   Lipitor [Atorvastatin] Other (See Comments)    Muscle cramps   Septra [Sulfamethoxazole-Trimethoprim] Itching    Home Medications    Prior to Admission medications   Medication Sig Start Date End Date Taking? Authorizing Provider  amLODipine (NORVASC) 10 MG tablet Take 1 tablet (10 mg total) by mouth daily. 04/05/22   Corwin Levins, MD  ascorbic acid (VITAMIN C) 500 MG tablet Take 500 mg by mouth daily.    [provider]  aspirin EC 81 MG tablet Take 81 mg by mouth daily.    [provider]  Blood Glucose Monitoring Suppl (ONE TOUCH ULTRA 2) w/Device KIT Use as directed daily E11.9 08/22/18   Corwin Levins, MD  Cholecalciferol (VITAMIN D3 PO) Take 1 capsule by mouth daily.    [provider]  citalopram (CELEXA) 40 MG tablet TAKE 1 TABLET(40 MG) BY MOUTH DAILY Patient taking differently: Take 40 mg by mouth daily. 02/23/21   Corwin Levins, MD  citalopram (CELEXA) 40 MG tablet Take 1 tablet (40 mg total) by mouth daily. 04/05/22   Corwin Levins, MD  Continuous Blood Gluc Receiver (DEXCOM G7 RECEIVER) DEVI Use as directed once per day E11.9 04/05/22   Corwin Levins, MD  Continuous Blood Gluc Sensor (DEXCOM G7 SENSOR) MISC Use as directed once every 2 weeks E11.9 04/05/22   Corwin Levins, MD  cyclobenzaprine (FLEXERIL) 10 MG tablet Take 1 tablet (10 mg total) by mouth 3 (three) times daily as needed for muscle spasms. 06/12/21   Jadene Pierini, MD  diclofenac Sodium (VOLTAREN) 1 % GEL Apply 1 application topically 3 (three) times daily as needed (PAIN). 10/01/20   Love, Evlyn Kanner, PA-C  docusate sodium (COLACE) 100 MG capsule Take 1 capsule (100 mg total) by mouth 2 (two) times daily.  Purchase over the counter Patient taking differently: Take 100 mg by mouth daily as needed for mild constipation. Purchase over the counter 10/01/20   Love, Evlyn Kanner, PA-C  doxycycline (VIBRA-TABS) 100 MG tablet Take 1 tablet (100 mg total) by mouth 2 (two) times daily. 04/05/22   Corwin Levins, MD  fenofibrate (TRICOR) 145 MG tablet Take 1 tablet (145 mg total) by mouth daily. TAKE 1 TABLET(145 MG) BY MOUTH DAILY 04/05/22   Corwin Levins, MD  fluconazole (DIFLUCAN) 100 MG tablet Take 1 tablet (100 mg total) by mouth daily. For 7 days 08/31/21   Rollene Rotunda, MD  fluticasone [facility]) 50 MCG/ACT nasal spray Place 2 sprays into both nostrils daily. 07/03/20   Corwin Levins, MD  gabapentin (NEURONTIN) 300 MG capsule Take 900 mg by mouth 3 (three) times daily.    [provider]  glucose blood test strip Use as instructed once daily E119 08/22/18   Corwin Levins, MD  hydrochlorothiazide (MICROZIDE) 12.5 MG capsule Take 1 capsule (12.5 mg total) by mouth daily. 04/05/22 04/05/23  Corwin Levins, MD  ibuprofen (ADVIL) 800 MG tablet Take 800 mg by mouth 3 (three) times daily. 06/07/21   [provider]  Lancets MISC Use as directed daily E11.9 08/22/18   Corwin Levins, MD  lidocaine (LIDODERM) 5 % Place 1 patch onto the skin daily. Can apply to back on at 8 am and off at 8 pm --has to be off for 12 hours. Purchase over the counter. Patient taking differently: Place 1 patch onto the skin daily as needed (pain). Can apply to back on at 8 am and off at 8 pm --has to be off for 12 hours. Purchase over the counter. 10/01/20   Love, Evlyn Kanner, PA-C  lisinopril (ZESTRIL) 40 MG tablet TAKE 1 TABLET(40 MG) BY MOUTH DAILY 04/05/22   Corwin Levins, MD  loratadine (CLARITIN) 10 MG tablet TAKE 1 TABLET(10 MG) BY MOUTH DAILY AS NEEDED FOR ALLERGIES 07/13/21   Corwin Levins, MD  magnesium oxide (MAG-OX) 400 MG tablet Take 400 mg by mouth daily.    [provider]  metFORMIN (GLUCOPHAGE-XR) 500 MG 24 hr tablet TAKE  2 TABLETS BY MOUTH DAILY WITH BREAKFAST Patient not taking: Reported on 04/20/2022 04/05/22   Corwin Levins, MD  Multiple Minerals-Vitamins (CALCIUM-MAGNESIUM-ZINC-D3) TABS Take 1 tablet by mouth daily.    [provider]  oxyCODONE (OXY IR/ROXICODONE) 5 MG immediate release tablet Take 5 mg by mouth every 6 (six) hours as needed. 10/22/21   [provider]  oxyCODONE-acetaminophen (PERCOCET) 10-325 MG tablet Take 1 tablet by mouth every 4 (four) hours as needed for pain. 06/12/21   Jadene Pierini, MD  Potassium Chloride ER 20 MEQ TBCR Take 1 tablet by mouth daily. 04/05/22   Corwin Levins, MD  rosuvastatin (CRESTOR) 20 MG tablet Take 1 tablet (20 mg total) by mouth daily. TAKE 1 TABLET(20 MG) BY MOUTH DAILY 04/05/22   Corwin Levins, MD  tadalafil (CIALIS) 20 MG tablet Take 0.5-1 tablets (10-20 mg total) by mouth every other day as needed for erectile dysfunction. 04/05/22   Corwin Levins, MD  tamsulosin (FLOMAX) 0.4 MG CAPS capsule Take 1 capsule (0.4 mg total) by mouth daily. 04/05/22   Corwin Levins, MD  tirzepatide [facility]) 2.5 MG/0.5ML Pen Inject 2.5 mg into the skin once a week. 04/05/22   Corwin Levins, MD  tiZANidine (ZANAFLEX) 4 MG tablet Take 4 mg by mouth 3 (three) times daily as needed. 06/11/21   [provider]  tolterodine (DETROL LA) 4 MG 24 hr capsule Take 1 capsule (4 mg total) by mouth daily. 04/05/22   Corwin Levins, MD  vitamin B-12 (CYANOCOBALAMIN) 1000 MCG tablet Take 1 tablet (1,000 mcg total) by mouth daily. 08/08/19   Corwin Levins, MD  zinc gluconate 50 MG tablet Take 50 mg by mouth daily.    [provider]    Physical Exam    Vital Signs:  Harold [name] does not have vital signs available for review today.  Given telephonic nature of communication, physical exam is limited. AAOx3. NAD. Normal affect.  Speech and respirations are unlabored.  Accessory Clinical Findings    None  Assessment & Plan    1.  Preoperative Cardiovascular Risk  Assessment: The patient is doing well from  a cardiac perspective. Therefore, based on ACC/AHA guidelines, the patient would be at acceptable risk for the planned procedure without further cardiovascular testing. According to the Revised Cardiac Risk Index (RCRI), his Perioperative Risk of Major Cardiac Event is (%): 0.9 His Functional Capacity in METs is: 5.07 according to the [facility] Activity Status Index (DASI).  The patient was advised that if he develops new symptoms prior to surgery to contact our office to arrange for a follow-up visit, and he verbalized understanding.  Per office protocol, if patient is without any new symptoms or concerns at the time of their virtual visit, he may hold Aspirin for 5-7 days prior to procedure. Please resume Aspirin as soon as possible postprocedure, at the discretion of the surgeon.   A copy of this note will be routed to requesting surgeon.  Time:   Today, I have spent 10 minutes with the patient with telehealth technology discussing medical history, symptoms, and management plan.     Harold Aland, NP-C  04/27/2022, 10:03 AM [address] Office [phone] Fax [phone]

## 2022-04-26 ENCOUNTER — Other Ambulatory Visit (HOSPITAL_COMMUNITY): Payer: Self-pay

## 2022-04-26 NOTE — Telephone Encounter (Signed)
Apologies for the delay, I called [employer] to follow up, needed to cancel current pending PA's and submitted new PA's over the phone. Should receive determination in less than 72 hours. PA reference number for receiver: [id number] PA reference number for sensors: [id number]

## 2022-04-27 ENCOUNTER — Other Ambulatory Visit: Payer: Self-pay | Admitting: Orthopaedic Surgery

## 2022-04-27 ENCOUNTER — Ambulatory Visit: Payer: Medicare HMO | Attending: Cardiology | Admitting: Nurse Practitioner

## 2022-04-27 DIAGNOSIS — Z01810 Encounter for preprocedural cardiovascular examination: Secondary | ICD-10-CM | POA: Diagnosis not present

## 2022-04-27 DIAGNOSIS — M25511 Pain in right shoulder: Secondary | ICD-10-CM

## 2022-04-27 NOTE — Telephone Encounter (Signed)
Pharmacy Patient Advocate Encounter  Received notification from [facility] that the request for prior authorization for Dexcom Sensors and Receivers has been denied due to .    Please be advised we currently do not have a Pharmacist to review denials, therefore you will need to process appeals accordingly as needed. Thanks for your support at this time.   You may call [phone] or fax [phone], to appeal.

## 2022-05-06 NOTE — Progress Notes (Signed)
Surgical Instructions    Your procedure is scheduled on Monday, May 16, 2022.  Report to [facility] Main Entrance "A" at 5:30 A.M., then check in with the Admitting office.  Call this number if you have problems the morning of surgery:  [phone]   If you have any questions prior to your surgery date call [phone]: Open Monday-Friday 8am-4pm If you experience any cold or flu symptoms such as cough, fever, chills, shortness of breath, etc. between now and your scheduled surgery, please notify us at the above number     Remember:  Do not eat after midnight the night before your surgery  You may drink clear liquids until 4:30 the morning of your surgery.   Clear liquids allowed are: Water, Non-Citrus Juices (without pulp), Carbonated Beverages, Clear Tea, Black Coffee ONLY (NO MILK, CREAM OR POWDERED CREAMER of any kind), and Gatorade   Take these medicines the morning of surgery with A SIP OF WATER:  amLODipine (NORVASC)  citalopram (CELEXA)  fenofibrate (TRICOR)  fluticasone (FLONASE)  gabapentin (NEURONTIN)  rosuvastatin (CRESTOR)  tamsulosin (FLOMAX)  tolterodine (DETROL LA    As needed: cyclobenzaprine (FLEXERIL)  loratadine (CLARITIN  oxyCODONE (OXY IR/ROXICODONE)  oxyCODONE-acetaminophen (PERCOCET) tiZANidine (ZANAFLEX    Follow your surgeon's instructions on when to stop Aspirin.  If no instructions were given by your surgeon then you will need to call the office to get those instructions.     As of today, STOP taking any Aleve, Naproxen, Ibuprofen, Motrin, Advil, Goody's, BC's, all herbal medications, fish oil, and all vitamins.  Patient Instructions  The night before surgery:  No food after midnight. ONLY clear liquids after midnight   The day of surgery (if you have diabetes): Drink ONE (1) 12 oz G2 given to you in your pre admission testing appointment by 4:30 the morning of surgery. Drink in one sitting. Do not sip.  This drink was given to you  during your hospital  pre-op appointment visit.  Nothing else to drink after completing the  12 oz bottle of G2.         If you have questions, please contact your surgeon's office.    WHAT DO I DO ABOUT MY DIABETES MEDICATION?   Do not take oral diabetes medicines (pills) the morning of surgery. Do NOT take metFORMIN (GLUCOPHAGE-XR) the morning of surgery.    The day of surgery, do NOT use tirzepatide [facility]) seven (7) days prior to surgery. Last dose 05/08/2022.   Do NOT  take other diabetes injectables, including Byetta (exenatide), Bydureon (exenatide ER), Victoza (liraglutide), or Trulicity (dulaglutide).  If your CBG is greater than 220 mg/dL, you may take  of your sliding scale (correction) dose of insulin.   HOW TO MANAGE YOUR DIABETES BEFORE AND AFTER SURGERY  Why is it important to control my blood sugar before and after surgery? Improving blood sugar levels before and after surgery helps healing and can limit problems. A way of improving blood sugar control is eating a healthy diet by:  Eating less sugar and carbohydrates  Increasing activity/exercise  Talking with your doctor about reaching your blood sugar goals High blood sugars (greater than 180 mg/dL) can raise your risk of infections and slow your recovery, so you will need to focus on controlling your diabetes during the weeks before surgery. Make sure that the doctor who takes care of your diabetes knows about your planned surgery including the date and location.  How do I manage my blood sugar before surgery? Check  your blood sugar at least 4 times a day, starting 2 days before surgery, to make sure that the level is not too high or low.  Check your blood sugar the morning of your surgery when you wake up and every 2 hours until you get to the Short Stay unit.  If your blood sugar is less than 70 mg/dL, you will need to treat for low blood sugar: Do not take insulin. Treat a low blood sugar (less than  70 mg/dL) with  cup of clear juice (cranberry or apple), 4 glucose tablets, OR glucose gel. Recheck blood sugar in 15 minutes after treatment (to make sure it is greater than 70 mg/dL). If your blood sugar is not greater than 70 mg/dL on recheck, call [phone] for further instructions. Report your blood sugar to the short stay nurse when you get to Short Stay.  If you are admitted to the hospital after surgery: Your blood sugar will be checked by the staff and you will probably be given insulin after surgery (instead of oral diabetes medicines) to make sure you have good blood sugar levels. The goal for blood sugar control after surgery is 80-180 mg/dL.            Do not wear jewelry. Do not wear lotions, powders, cologne or deodorant. Men may shave face and neck. Do not bring valuables to the hospital. Do not wear nail polish, gel polish, artificial nails, or any other type of covering on natural nails (fingers and toes) If you have artificial nails or gel coating that need to be removed by a nail salon, please have this removed prior to surgery. Artificial nails or gel coating may interfere with anesthesia's ability to adequately monitor your vital signs.  [facility] is not responsible for any belongings or valuables.    Do NOT Smoke (Tobacco/Vaping)  24 hours prior to your procedure  If you use a CPAP at night, you may bring your mask for your overnight stay.   Contacts, glasses, hearing aids, dentures or partials may not be worn into surgery, please bring cases for these belongings   For patients admitted to the hospital, discharge time will be determined by your treatment team.   Patients discharged the day of surgery will not be allowed to drive home, and someone needs to stay with them for 24 hours.   SURGICAL WAITING ROOM VISITATION Patients having surgery or a procedure may have no more than 2 support people in the waiting area - these visitors may rotate.   Children  under the age of 91 must have an adult with them who is not the patient. If the patient needs to stay at the hospital during part of their recovery, the visitor guidelines for inpatient rooms apply. Pre-op nurse will coordinate an appropriate time for 1 support person to accompany patient in pre-op.  This support person may not rotate.   Please refer to [email or web address] for the visitor guidelines for Inpatients (after your surgery is over and you are in a regular room).    Special instructions:    Oral Hygiene is also important to reduce your risk of infection.  Remember - BRUSH YOUR TEETH THE MORNING OF SURGERY WITH YOUR REGULAR TOOTHPASTE   [facility]- Preparing For Surgery  Before surgery, you can play an important role. Because skin is not sterile, your skin needs to be as free of germs as possible. You can reduce the number of germs on your skin by washing with CHG (  chlorahexidine gluconate) Soap before surgery.  CHG is an antiseptic cleaner which kills germs and bonds with the skin to continue killing germs even after washing.     Please do not use if you have an allergy to CHG or antibacterial soaps. If your skin becomes reddened/irritated stop using the CHG.  Do not shave (including legs and underarms) for at least 48 hours prior to first CHG shower. It is OK to shave your face.  Please follow these instructions carefully.     Shower the NIGHT BEFORE SURGERY and the MORNING OF SURGERY with CHG Soap.   If you chose to wash your hair, wash your hair first as usual with your normal shampoo. After you shampoo, rinse your hair and body thoroughly to remove the shampoo.  Then [employer] and genitals (private parts) with your normal soap and rinse thoroughly to remove soap.  After that Use CHG Soap as you would any other liquid soap. You can apply CHG directly to the skin and wash gently with a scrungie or a clean washcloth.   Apply  the CHG Soap to your body ONLY FROM THE NECK DOWN.  Do not use on open wounds or open sores. Avoid contact with your eyes, ears, mouth and genitals (private parts). Wash Face and genitals (private parts)  with your normal soap.   Wash thoroughly, paying special attention to the area where your surgery will be performed.  Thoroughly rinse your body with warm water from the neck down.  DO NOT shower/wash with your normal soap after using and rinsing off the CHG Soap.  Pat yourself dry with a CLEAN TOWEL.  Wear CLEAN PAJAMAS to bed the night before surgery  Place CLEAN SHEETS on your bed the night before your surgery  DO NOT SLEEP WITH PETS.   Day of Surgery:  Take a shower with CHG soap. Wear Clean/Comfortable clothing the morning of surgery Do not apply any deodorants/lotions.   Remember to brush your teeth WITH YOUR REGULAR TOOTHPASTE.    If you received a COVID test during your pre-op visit, it is requested that you wear a mask when out in public, stay away from anyone that may not be feeling well, and notify your surgeon if you develop symptoms. If you have been in contact with anyone that has tested positive in the last 10 days, please notify your surgeon.    Please read over the following fact sheets that you were given.

## 2022-05-08 ENCOUNTER — Ambulatory Visit
Admission: RE | Admit: 2022-05-08 | Discharge: 2022-05-08 | Disposition: A | Discharge status: Home / Self Care | Payer: Medicare HMO | Source: Ambulatory Visit | Attending: Orthopaedic Surgery | Admitting: Orthopaedic Surgery

## 2022-05-08 DIAGNOSIS — R600 Localized edema: Secondary | ICD-10-CM | POA: Diagnosis not present

## 2022-05-08 DIAGNOSIS — M25511 Pain in right shoulder: Secondary | ICD-10-CM

## 2022-05-08 DIAGNOSIS — M25411 Effusion, right shoulder: Secondary | ICD-10-CM | POA: Diagnosis not present

## 2022-05-08 DIAGNOSIS — M7551 Bursitis of right shoulder: Secondary | ICD-10-CM | POA: Diagnosis not present

## 2022-05-09 ENCOUNTER — Inpatient Hospital Stay (HOSPITAL_COMMUNITY)
Admission: RE | Admit: 2022-05-09 | Discharge: 2022-05-09 | Disposition: A | Discharge status: Home / Self Care | Payer: Medicare HMO | Source: Ambulatory Visit

## 2022-05-11 ENCOUNTER — Ambulatory Visit (INDEPENDENT_AMBULATORY_CARE_PROVIDER_SITE_OTHER): Payer: Medicare HMO | Admitting: Podiatry

## 2022-05-11 DIAGNOSIS — M7751 Other enthesopathy of right foot: Secondary | ICD-10-CM | POA: Diagnosis not present

## 2022-05-11 DIAGNOSIS — M7752 Other enthesopathy of left foot: Secondary | ICD-10-CM | POA: Diagnosis not present

## 2022-05-11 DIAGNOSIS — B351 Tinea unguium: Secondary | ICD-10-CM

## 2022-05-11 DIAGNOSIS — M79674 Pain in right toe(s): Secondary | ICD-10-CM | POA: Diagnosis not present

## 2022-05-11 DIAGNOSIS — M79675 Pain in left toe(s): Secondary | ICD-10-CM

## 2022-05-11 MED ORDER — BETAMETHASONE SOD PHOS & ACET 6 (3-3) MG/ML IJ SUSP
3.0000 mg | Freq: Once | INTRAMUSCULAR | Status: AC
  Administered 2022-05-11: 3 mg via INTRA_ARTICULAR

## 2022-05-11 NOTE — Progress Notes (Signed)
   Chief Complaint  Patient presents with   Diabetes    Diabetic foot care, nail trim     SUBJECTIVE Patient with a history of diabetes mellitus presents to office today complaining of elongated, thickened nails that cause pain while ambulating in shoes.  Patient is unable to trim their own nails.   Patient also states that the injection she received last visit to his first MTP bilateral helped significantly.  The pain is slowly returning.  This provided a few months of relief.  He is requesting injections today patient is here for further evaluation and treatment.   Past Medical History:  Diagnosis Date   Acute idiopathic pericarditis 04/08/2010   Qualifier: Diagnosis of  By: [name], [name]     Acute sinusitis 06/04/2014   Anxiety    CHF (congestive heart failure) ([place]) 06/29/2010   was pericardititis not CHF   Chronic back pain    Constipation    DDD (degenerative disc disease), lumbar 09/14/2012   Depression    Erectile dysfunction 09/14/2012   GERD (gastroesophageal reflux disease)    ocassional   Gout, unspecified 05/01/2007   Qualifier: Diagnosis of  By: [name] MD, Dellis Filbert A    Headache    migraines in the past   History of pancreatitis    a. admx 04-2009.Marland KitchenMarland Kitchen? 2-2 triglycerides   Hypertension    Hypertriglyceridemia    a. followed by [facility]   Left sided sciatica 09/14/2012   Neuropathy    Obesity    OSA (obstructive sleep apnea)    CPAP- not current   Restless legs    Type 2 diabetes mellitus with diabetic neuropathy, unspecified ([place]) 09/20/2019    OBJECTIVE General Patient is awake, alert, and oriented x 3 and in no acute distress. Derm Skin is dry and supple bilateral. Negative open lesions or macerations. Remaining integument unremarkable. Nails are tender, long, thickened and dystrophic with subungual debris, consistent with onychomycosis, 1-5 bilateral. No signs of infection noted. Vasc  DP and PT pedal pulses palpable bilaterally. Temperature gradient  within normal limits.  Neuro Epicritic and protective threshold sensation diminished bilaterally.  Musculoskeletal Exam pain on palpation range of motion of the first MTP bilateral  ASSESSMENT 1. Diabetes Mellitus w/ peripheral neuropathy 2.  Pain due to onychomycosis of toenails bilateral 3.  Hallux limitus/MTP capsulitis bilateral  PLAN OF CARE 1. Patient evaluated today. 2. Instructed to maintain good pedal hygiene and foot care. Stressed importance of controlling blood sugar.  3. Mechanical debridement of nails 1-5 bilaterally performed using a nail nipper.  4.  Injection of 0.5 cc Celestone Soluspan injected in the first MTP bilateral  5.  Return to clinic in 3 mos. for routine foot care  *Goes by Daryel November, DPM [facility]  Dr. Edrick Kins, DPM    [address], [place] [address]                Office [phone]  Fax [phone]

## 2022-05-12 NOTE — Progress Notes (Signed)
Surgical Instructions    Your procedure is scheduled on Wednesday, February 21st.  Report to [facility] Main Entrance "A" at 9:20 A.M., then check in with the Admitting office.  Call this number if you have problems the morning of surgery:  [phone]   If you have any questions prior to your surgery date call [phone]: Open Monday-Friday 8am-4pm If you experience any cold or flu symptoms such as cough, fever, chills, shortness of breath, etc. between now and your scheduled surgery, please notify us at the above number     Remember:  Do not eat after midnight the night before your surgery  You may drink clear liquids until 8:20 AM the morning of your surgery.   Clear liquids allowed are: Water, Non-Citrus Juices (without pulp), Carbonated Beverages, Clear Tea, Black Coffee ONLY (NO MILK, CREAM OR POWDERED CREAMER of any kind), and Gatorade   Take these medicines the morning of surgery with A SIP OF WATER:  amLODipine (NORVASC)  citalopram (CELEXA)  fenofibrate (TRICOR)  gabapentin (NEURONTIN)  rosuvastatin (CRESTOR)  tamsulosin (FLOMAX)    As needed: cyclobenzaprine (FLEXERIL)  loratadine (CLARITIN  oxyCODONE (OXY IR/ROXICODONE)  oxyCODONE-acetaminophen (PERCOCET) tiZANidine (ZANAFLEX  fluticasone (FLONASE)   Follow your surgeon's instructions on when to stop Aspirin.  If no instructions were given by your surgeon then you will need to call the office to get those instructions.     As of today, STOP taking any Aleve, Naproxen, Ibuprofen, Motrin, Advil, Goody's, BC's, all herbal medications, fish oil, and all vitamins. This includes diclofenac Sodium (VOLTAREN) 1 % gel.    WHAT DO I DO ABOUT MY DIABETES MEDICATION?   Do NOT take metFORMIN (GLUCOPHAGE-XR) the morning of surgery.    HOLD tirzepatide [facility]) for seven (7) days prior to surgery. Last dose 05/11/2022.    HOW TO MANAGE YOUR DIABETES BEFORE AND AFTER SURGERY  Why is it important to control my  blood sugar before and after surgery? Improving blood sugar levels before and after surgery helps healing and can limit problems. A way of improving blood sugar control is eating a healthy diet by:  Eating less sugar and carbohydrates  Increasing activity/exercise  Talking with your doctor about reaching your blood sugar goals High blood sugars (greater than 180 mg/dL) can raise your risk of infections and slow your recovery, so you will need to focus on controlling your diabetes during the weeks before surgery. Make sure that the doctor who takes care of your diabetes knows about your planned surgery including the date and location.  How do I manage my blood sugar before surgery? Check your blood sugar at least 4 times a day, starting 2 days before surgery, to make sure that the level is not too high or low.  Check your blood sugar the morning of your surgery when you wake up and every 2 hours until you get to the Short Stay unit.  If your blood sugar is less than 70 mg/dL, you will need to treat for low blood sugar: Do not take insulin. Treat a low blood sugar (less than 70 mg/dL) with  cup of clear juice (cranberry or apple), 4 glucose tablets, OR glucose gel. Recheck blood sugar in 15 minutes after treatment (to make sure it is greater than 70 mg/dL). If your blood sugar is not greater than 70 mg/dL on recheck, call [phone] for further instructions. Report your blood sugar to the short stay nurse when you get to Short Stay.  If you are admitted to  the hospital after surgery: Your blood sugar will be checked by the staff and you will probably be given insulin after surgery (instead of oral diabetes medicines) to make sure you have good blood sugar levels. The goal for blood sugar control after surgery is 80-180 mg/dL.            Do not wear jewelry. Do not wear lotions, powders, cologne or deodorant. Men may shave face and neck. Do not bring valuables to the hospital.   [facility] is not responsible for any belongings or valuables.    Do NOT Smoke (Tobacco/Vaping)  24 hours prior to your procedure  If you use a CPAP at night, you may bring your mask for your overnight stay.   Contacts, glasses, hearing aids, dentures or partials may not be worn into surgery, please bring cases for these belongings   For patients admitted to the hospital, discharge time will be determined by your treatment team.   Patients discharged the day of surgery will not be allowed to drive home, and someone needs to stay with them for 24 hours.   SURGICAL WAITING ROOM VISITATION Patients having surgery or a procedure may have no more than 2 support people in the waiting area - these visitors may rotate.   Children under the age of 37 must have an adult with them who is not the patient. If the patient needs to stay at the hospital during part of their recovery, the visitor guidelines for inpatient rooms apply. Pre-op nurse will coordinate an appropriate time for 1 support person to accompany patient in pre-op.  This support person may not rotate.   Please refer to [email or web address] for the visitor guidelines for Inpatients (after your surgery is over and you are in a regular room).    Special instructions:    Oral Hygiene is also important to reduce your risk of infection.  Remember - BRUSH YOUR TEETH THE MORNING OF SURGERY WITH YOUR REGULAR TOOTHPASTE   [facility]- Preparing For Surgery  Before surgery, you can play an important role. Because skin is not sterile, your skin needs to be as free of germs as possible. You can reduce the number of germs on your skin by washing with CHG (chlorahexidine gluconate) Soap before surgery.  CHG is an antiseptic cleaner which kills germs and bonds with the skin to continue killing germs even after washing.     Please do not use if you have an allergy to CHG or antibacterial soaps. If your  skin becomes reddened/irritated stop using the CHG.  Do not shave (including legs and underarms) for at least 48 hours prior to first CHG shower. It is OK to shave your face.  Please follow these instructions carefully.     Shower the NIGHT BEFORE SURGERY and the MORNING OF SURGERY with CHG Soap.   If you chose to wash your hair, wash your hair first as usual with your normal shampoo. After you shampoo, rinse your hair and body thoroughly to remove the shampoo.  Then [employer] and genitals (private parts) with your normal soap and rinse thoroughly to remove soap.  After that Use CHG Soap as you would any other liquid soap. You can apply CHG directly to the skin and wash gently with a scrungie or a clean washcloth.   Apply the CHG Soap to your body ONLY FROM THE NECK DOWN.  Do not use on open wounds or open sores. Avoid contact with your eyes, ears, mouth and  genitals (private parts). Wash Face and genitals (private parts)  with your normal soap.   Wash thoroughly, paying special attention to the area where your surgery will be performed.  Thoroughly rinse your body with warm water from the neck down.  DO NOT shower/wash with your normal soap after using and rinsing off the CHG Soap.  Pat yourself dry with a CLEAN TOWEL.  Wear CLEAN PAJAMAS to bed the night before surgery  Place CLEAN SHEETS on your bed the night before your surgery  DO NOT SLEEP WITH PETS.   Day of Surgery:  Take a shower with CHG soap. Wear Clean/Comfortable clothing the morning of surgery Do not apply any deodorants/lotions.   Remember to brush your teeth WITH YOUR REGULAR TOOTHPASTE.    If you received a COVID test during your pre-op visit, it is requested that you wear a mask when out in public, stay away from anyone that may not be feeling well, and notify your surgeon if you develop symptoms. If you have been in contact with anyone that has tested positive in the last 10 days, please notify your  surgeon.    Please read over the following fact sheets that you were given.

## 2022-05-13 ENCOUNTER — Encounter (HOSPITAL_COMMUNITY): Payer: Self-pay

## 2022-05-13 ENCOUNTER — Other Ambulatory Visit: Payer: Self-pay

## 2022-05-13 ENCOUNTER — Encounter (HOSPITAL_COMMUNITY)
Admission: RE | Admit: 2022-05-13 | Discharge: 2022-05-13 | Disposition: A | Discharge status: Home / Self Care | Payer: Medicare HMO | Source: Ambulatory Visit | Attending: Neurosurgery | Admitting: Neurosurgery

## 2022-05-13 VITALS — BP 141/79 | HR 54 | Temp 98.0°F | Resp 17 | Ht 71.0 in | Wt 260.0 lb

## 2022-05-13 DIAGNOSIS — E119 Type 2 diabetes mellitus without complications: Secondary | ICD-10-CM | POA: Diagnosis not present

## 2022-05-13 DIAGNOSIS — E781 Pure hyperglyceridemia: Secondary | ICD-10-CM | POA: Diagnosis not present

## 2022-05-13 DIAGNOSIS — S32018A Other fracture of first lumbar vertebra, initial encounter for closed fracture: Secondary | ICD-10-CM | POA: Diagnosis not present

## 2022-05-13 DIAGNOSIS — E669 Obesity, unspecified: Secondary | ICD-10-CM | POA: Diagnosis not present

## 2022-05-13 DIAGNOSIS — E1140 Type 2 diabetes mellitus with diabetic neuropathy, unspecified: Secondary | ICD-10-CM | POA: Diagnosis not present

## 2022-05-13 DIAGNOSIS — G4733 Obstructive sleep apnea (adult) (pediatric): Secondary | ICD-10-CM | POA: Diagnosis not present

## 2022-05-13 DIAGNOSIS — F32A Depression, unspecified: Secondary | ICD-10-CM | POA: Insufficient documentation

## 2022-05-13 DIAGNOSIS — G8929 Other chronic pain: Secondary | ICD-10-CM | POA: Insufficient documentation

## 2022-05-13 DIAGNOSIS — M25511 Pain in right shoulder: Secondary | ICD-10-CM | POA: Diagnosis not present

## 2022-05-13 DIAGNOSIS — I110 Hypertensive heart disease with heart failure: Secondary | ICD-10-CM | POA: Diagnosis not present

## 2022-05-13 DIAGNOSIS — Z01818 Encounter for other preprocedural examination: Secondary | ICD-10-CM | POA: Diagnosis not present

## 2022-05-13 DIAGNOSIS — G2581 Restless legs syndrome: Secondary | ICD-10-CM | POA: Diagnosis not present

## 2022-05-13 DIAGNOSIS — S32008S Other fracture of unspecified lumbar vertebra, sequela: Secondary | ICD-10-CM | POA: Diagnosis not present

## 2022-05-13 LAB — BASIC METABOLIC PANEL
Anion gap: 10 (ref 5–15)
BUN: 15 mg/dL (ref 6–20)
CO2: 26 mmol/L (ref 22–32)
Calcium: 9.2 mg/dL (ref 8.9–10.3)
Chloride: 101 mmol/L (ref 98–111)
Creatinine, Ser: 0.95 mg/dL (ref 0.61–1.24)
GFR, Estimated: >60 mL/min (ref >60)
Glucose, Bld: 204 mg/dL — ABNORMAL HIGH (ref 70–99)
Potassium: 4.1 mmol/L (ref 3.5–5.1)
Sodium: 137 mmol/L (ref 135–145)

## 2022-05-13 LAB — CBC
HCT: 44.4 % (ref 39.0–52.0)
Hemoglobin: 14.3 g/dL (ref 13.0–17.0)
MCH: 31 pg (ref 26.0–34.0)
MCHC: 32.2 g/dL (ref 30.0–36.0)
MCV: 96.3 fL (ref 80.0–100.0)
Platelets: 427 10*3/uL — ABNORMAL HIGH (ref 150–400)
RBC: 4.61 MIL/uL (ref 4.22–5.81)
RDW: 13.7 % (ref 11.5–15.5)
WBC: 15.9 10*3/uL — ABNORMAL HIGH (ref 4.0–10.5)
nRBC: 0 % (ref 0.0–0.2)

## 2022-05-13 LAB — SURGICAL PCR SCREEN
MRSA, PCR: NEGATIVE
Staphylococcus aureus: NEGATIVE

## 2022-05-13 LAB — GLUCOSE, CAPILLARY: Glucose-Capillary: 191 mg/dL — ABNORMAL HIGH (ref 70–99)

## 2022-05-13 NOTE — Anesthesia Preprocedure Evaluation (Signed)
Anesthesia Evaluation  Patient identified by MRN, date of birth, ID band Patient awake    Reviewed: Allergy & Precautions, NPO status , Patient's Chart, lab work & pertinent test results  Airway Mallampati: II  TM Distance: >3 FB Neck ROM: Full    Dental  (+) Dental Advisory Given, Edentulous Upper, Edentulous Lower   Pulmonary sleep apnea , Current SmokerPatient did not abstain from smoking.   Pulmonary exam normal breath sounds clear to auscultation       Cardiovascular hypertension, Pt. on medications +CHF   Rhythm:Regular Rate:Normal  Stress 2017 T wave inversion was noted during recovery in the II, III, aVF, V4, V5 and V6 leads. Hypertensive response to exercise, peak blood pressure 225/97, rest blood pressure 154/103 Shortness of breath main symptom, no chest pain. Fair exercise time of 5 minutes. Abnormal exercise treadmill test, T wave inversions noted inferior laterally late in recovery. Intermediate risk study.    Neuro/Psych  Headaches PSYCHIATRIC DISORDERS Anxiety Depression     Neuromuscular disease    GI/Hepatic Neg liver ROS,GERD  ,,  Endo/Other  diabetes, Type 2, Oral Hypoglycemic Agents    Renal/GU negative Renal ROS     Musculoskeletal  (+) Arthritis ,    Abdominal   Peds  Hematology negative hematology ROS (+)   Anesthesia Other Findings All: Septra lipitor and Colchicine  Reproductive/Obstetrics                             Lab Results  Component Value Date   WBC 15.9 (H) 05/13/2022   HGB 14.3 05/13/2022   HCT 44.4 05/13/2022   MCV 96.3 05/13/2022   PLT 427 (H) 05/13/2022   Lab Results  Component Value Date   CREATININE 0.95 05/13/2022   BUN 15 05/13/2022   NA 137 05/13/2022   K 4.1 05/13/2022   CL 101 05/13/2022   CO2 26 05/13/2022    Anesthesia Physical Anesthesia Plan  ASA: 3  Anesthesia Plan: General   Post-op Pain Management: Gabapentin PO  (pre-op)*, Tylenol PO (pre-op)* and Ketamine IV*   Induction: Intravenous  PONV Risk Score and Plan: 2 and Dexamethasone, Ondansetron, Midazolam and Treatment may vary due to age or medical condition  Airway Management Planned: Oral ETT  Additional Equipment: None  Intra-op Plan:   Post-operative Plan: Extubation in OR  Informed Consent: I have reviewed the patients History and Physical, chart, labs and discussed the procedure including the risks, benefits and alternatives for the proposed anesthesia with the patient or authorized representative who has indicated his/her understanding and acceptance.     Dental advisory given  Plan Discussed with: CRNA  Anesthesia Plan Comments: (PAT note written 05/13/2022 by Myra Gianotti, PA-C.  )        Anesthesia Quick Evaluation

## 2022-05-13 NOTE — Progress Notes (Deleted)
April, RN, notified of pt's MRSA+ result. She said she will let Dr. Laurance Flatten know

## 2022-05-13 NOTE — Progress Notes (Signed)
PCP - Dr. Cathlean Cower Cardiologist - Dr. Minus Breeding  PPM/ICD - denies   Chest x-ray - 09/27/20 EKG - 08/31/21 Stress Test - 08/12/15 ECHO - 03/26/10 Cardiac Cath - 06/15/10  Sleep Study - 02/01/21, OSA+ BiPaP- denies  Fasting Blood Sugar - 90-100 Checks Blood Sugar twice per week  Last dose of GLP1 agonist-  2/11 GLP1 instructions: Hold 7 days prior. Do not take 2/18 dose  Blood Thinner Instructions: n/a Aspirin Instructions: Hold 7 days. Last dose 2/14  ERAS Protcol - yes, no drink   COVID TEST- n/a   Anesthesia review: yes, cardiac hx  Patient denies shortness of breath, fever, cough and chest pain at PAT appointment   All instructions explained to the patient, with a verbal understanding of the material. Patient agrees to go over the instructions while at home for a better understanding. The opportunity to ask questions was provided.

## 2022-05-13 NOTE — Progress Notes (Signed)
Anesthesia Chart Review:  Case: [id number] Date/Time: 05/18/22 1109   Procedure: Lumbar fusion - L3-L4 - L4-L5 revision for pseudoarthrosis with removal of hardware replacement and redo decompression foraminotomy on the left at L4-5 (Back)   Anesthesia type: General   Pre-op diagnosis: Lumbar fracture   Location: [facility] OR ROOM 18 / [facility]   Surgeons: Kary Kos, MD       DISCUSSION: Patient is a 57 year old male scheduled for the above procedure.  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  Preoperative cardiology input outline on 04/27/22 by Christen Bame, NP, " Preoperative Cardiovascular Risk Assessment: The patient is doing well from a cardiac perspective. Therefore, based on ACC/AHA guidelines, the patient would be at acceptable risk for the planned procedure without further cardiovascular testing. According to the Revised Cardiac Risk Index (RCRI), his Perioperative Risk of Major Cardiac Event is (%): 0.9 His Functional Capacity in METs is: 5.07 according to the Duke Activity Status Index (DASI)... Per office protocol, if patient is without any new symptoms or concerns at the time of their virtual visit, he may hold Aspirin for 5-7 days prior to procedure. Please resume Aspirin as soon as possible postprocedure, at the discretion of the surgeon."  Last ASA 05/11/22. He will hold his 05/15/22 dose of Mounjaro.   Called WBC of 15.9K to Lorriane Shire at Dr. Windy Carina office. Anesthesia team to evaluate on the day of surgery.   VS: BP (!) 141/79   Pulse (!) 54   Temp 36.7 C   Resp 17   Ht 5' 11"$  (1.803 m)   Wt 117.9 kg   SpO2  97%   BMI 36.26 kg/m   PROVIDERS: Biagio Borg, MD is PCP Minus Breeding, MD is cardiologist Shelva Majestic, MD is cardiologist (for OSA)  LABS: Preoperative labs noted. A1c 6.9% 04/05/22.  (all labs ordered are listed, but only abnormal results are displayed)  Labs Reviewed  GLUCOSE, CAPILLARY - Abnormal; Notable for the following components:      Result Value   Glucose-Capillary 191 (*)    All other components within normal limits  CBC - Abnormal; Notable for the following components:   WBC 15.9 (*)    Platelets 427 (*)    All other components within normal limits  SURGICAL PCR SCREEN  BASIC METABOLIC PANEL  TYPE AND SCREEN    IMAGES: CT L-spine 04/04/22: IMPRESSION: 1. Worsening appearance of L3-L4 and L4-L5 fusion since June 2023: Evidence of pedicle screw loosening throughout, progressed at the L3 and L4 screws. Doubt solid arthrodesis at L3-L4, and absent arthrodesis at L4-L5 with ongoing interbody vacuum phenomena there, and interval mild posterior retropulsion of the left side interbody implant suspected contributing to increased and severe left L4 neural foraminal stenosis. And limited thecal sac detail, difficult to exclude residual spinal stenosis at both levels. 2. Other lumbar levels appear stable. 3. Left nephrolithiasis. Aortic Atherosclerosis (ICD10-I70.0).    EKG: 08/31/2021: Normal sinus rhythm.  Right axis deviation.  Cannot rule out anterior infarct, age undetermined.   CV: Exercise tolerance test 08/12/2015: T wave inversion was noted during recovery in the II, III, aVF, V4, V5 and V6 leads. Hypertensive response to exercise, peak blood pressure 225/97, rest blood pressure 154/103 Shortness of breath main  symptom, no chest pain. Fair exercise time of 5 minutes. Abnormal exercise treadmill test, T wave inversions noted inferior laterally late in recovery. Intermediate risk study.   Dr. [name] commented on result stating that the EKG changes were  nonspecific and the main issue was his hypertensive response.   TTE 03/26/2010 (during hospitalization for pericarditis): Study Conclusions   - Left ventricle: The cavity size was normal. Wall thickness was  increased in a pattern of mild LVH. Systolic function was normal.  The estimated ejection fraction was in the range of 55% to 60%.  Wall motion was normal; there were no regional wall motion  abnormalities. Left ventricular diastolic function parameters were  indeterminant.  - Aortic valve: There was no stenosis.  - Mitral valve: Trivial regurgitation.  - Right ventricle: The cavity size was normal. Systolic function was  normal.  - Tricuspid valve: Peak RV-RA gradient: 38m Hg (S).  - Pulmonary arteries: PA peak pressure: 264mHg (S).  - Inferior vena cava: The vessel was normal in size; the  respirophasic diameter changes were in the normal range (= 50%);  findings are consistent with normal central venous pressure.  - Pericardium, extracardiac: Mild to moderate pericardial effusion.  There is mild right atrial indentation. No significant RV  diastolic collapse. The IVC is normal. < 25% respirophasic  variation of the mitral inflow E velocity. No evidence for  tamponade.    Past Medical History:  Diagnosis Date   Acute idiopathic pericarditis 04/08/2010   Qualifier: Diagnosis of  By: WeJorene MinorsScott     Acute sinusitis 06/04/2014   Anxiety    CH[place] (congestive heart failure) [place]   was pericardititis not CHF   Chronic back pain    Constipation    DDD (degenerative disc disease), lumbar 09/14/2012   Depression    Erectile dysfunction 09/14/2012   GERD (gastroesophageal reflux disease)    ocassional   Gout, unspecified 05/01/2007   Qualifier: Diagnosis of  By: ToSherren MochaD, JeDellis Filbert    Headache    migraines in the past   History of pancreatitis    a. admx 04-2009...Marland KitchenMarland Kitchen2-2 triglycerides   Hypertension    Hypertriglyceridemia    a. followed by [facility]   Left  sided sciatica 09/14/2012   Neuropathy    Obesity    OSA (obstructive sleep apnea)    CPAP- not current   Restless legs    Type 2 diabetes mellitus with diabetic neuropathy, unspecified ([place]/25/2021    Past Surgical History:  Procedure Laterality Date   ANTERIOR CERVICAL DECOMP/DISCECTOMY FUSION N/A 09/14/2020   Procedure: Anterior Cervical Decompression Fusion - Cervical three-Cervical four;  Surgeon: CrKary KosMD;  [facility];  Laterality: N/A;   BACK SURGERY  2021   lumbar fusion   COLONOSCOPY     ELBOW SURGERY Left    KNEE SURGERY Right    x3 scopes   KNEE SURGERY Right    open menisicus repair   LUMBAR LAMINECTOMY/DECOMPRESSION MICRODISCECTOMY N/A 08/26/2015   Procedure: Lumbar Two-Sacral One  Laminectomy for decompression;  Surgeon: BeKevan Nyitty, MD;  [facility];  Service: Neurosurgery;  Laterality: N/A;   pericardectomy  07/15/2010   Dr. GeServando Snare pleurx catheter placement  07/15/2010   Dr. GeServando Snare SHOULDER SURGERY     right and left shoulders- Rotator cuff repair   SHOULDER SURGERY Right 2023   TONSILLECTOMY AND ADENOIDECTOMY     one tonsil  MEDICATIONS:  amLODipine (NORVASC) 10 MG tablet   Aromatic Inhalants (VICKS VAPOINHALER IN)   ascorbic acid (VITAMIN C) 500 MG tablet   aspirin EC 81 MG tablet   aspirin-sod bicarb-citric acid (ALKA-SELTZER) 325 MG TBEF tablet   Blood Glucose Monitoring Suppl (ONE TOUCH ULTRA 2) w/Device KIT   Cholecalciferol (VITAMIN D3 PO)   citalopram (CELEXA) 40 MG tablet   Continuous Blood Gluc Receiver (DEXCOM G7 RECEIVER) DEVI   Continuous Blood Gluc Sensor (DEXCOM G7 SENSOR) MISC   cyclobenzaprine (FLEXERIL) 10 MG tablet   diclofenac Sodium (VOLTAREN) 1 % GEL   fenofibrate (TRICOR) 145 MG tablet   fluticasone (FLONASE) 50 MCG/ACT nasal spray   gabapentin (NEURONTIN) 300 MG capsule   glucose blood test strip   hydrochlorothiazide (MICROZIDE) 12.5 MG capsule   ibuprofen  (ADVIL) 800 MG tablet   Lancets MISC   lisinopril (ZESTRIL) 40 MG tablet   loratadine (CLARITIN) 10 MG tablet   Magnesium 500 MG TABS   metFORMIN (GLUCOPHAGE-XR) 500 MG 24 hr tablet   oxyCODONE-acetaminophen (PERCOCET) 10-325 MG tablet   Phenylephrine-Acetaminophen (TYLENOL SINUS CONGESTION/PAIN PO)   Potassium Chloride ER 20 MEQ TBCR   rosuvastatin (CRESTOR) 20 MG tablet   tadalafil (CIALIS) 20 MG tablet   tamsulosin (FLOMAX) 0.4 MG CAPS capsule   tirzepatide (MOUNJARO) 2.5 MG/0.5ML Pen   tiZANidine (ZANAFLEX) 4 MG tablet   tolterodine (DETROL LA) 4 MG 24 hr capsule   vitamin B-12 (CYANOCOBALAMIN) 1000 MCG tablet   zinc gluconate 50 MG tablet   No current facility-administered medications for this encounter.    Myra Gianotti, PA-C Surgical Short Stay/Anesthesiology [facility] Phone [phone] [facility] Phone [phone] 05/13/2022 4:41 PM

## 2022-05-16 ENCOUNTER — Ambulatory Visit: Payer: Medicare HMO | Admitting: Podiatry

## 2022-05-17 DIAGNOSIS — H524 Presbyopia: Secondary | ICD-10-CM | POA: Diagnosis not present

## 2022-05-17 DIAGNOSIS — H52223 Regular astigmatism, bilateral: Secondary | ICD-10-CM | POA: Diagnosis not present

## 2022-05-17 DIAGNOSIS — E119 Type 2 diabetes mellitus without complications: Secondary | ICD-10-CM | POA: Diagnosis not present

## 2022-05-17 DIAGNOSIS — Z135 Encounter for screening for eye and ear disorders: Secondary | ICD-10-CM | POA: Diagnosis not present

## 2022-05-18 ENCOUNTER — Encounter (HOSPITAL_COMMUNITY)
Admission: RE | Disposition: A | Discharge status: Home / Self Care | Payer: Self-pay | Source: Home / Self Care | Attending: Neurosurgery

## 2022-05-18 ENCOUNTER — Inpatient Hospital Stay (HOSPITAL_COMMUNITY)
Admission: RE | Admit: 2022-05-18 | Discharge: 2022-05-20 | DRG: 460 | Disposition: A | Discharge status: Home / Self Care | Payer: Medicare HMO | Attending: Neurosurgery | Admitting: Neurosurgery

## 2022-05-18 ENCOUNTER — Inpatient Hospital Stay (HOSPITAL_COMMUNITY): Payer: Medicare HMO | Admitting: Vascular Surgery

## 2022-05-18 ENCOUNTER — Inpatient Hospital Stay (HOSPITAL_COMMUNITY): Payer: Medicare HMO

## 2022-05-18 ENCOUNTER — Encounter (HOSPITAL_COMMUNITY): Payer: Self-pay | Admitting: Neurosurgery

## 2022-05-18 ENCOUNTER — Inpatient Hospital Stay (HOSPITAL_COMMUNITY): Payer: Medicare HMO | Admitting: Anesthesiology

## 2022-05-18 ENCOUNTER — Other Ambulatory Visit: Payer: Self-pay

## 2022-05-18 DIAGNOSIS — G8929 Other chronic pain: Secondary | ICD-10-CM | POA: Diagnosis present

## 2022-05-18 DIAGNOSIS — I110 Hypertensive heart disease with heart failure: Secondary | ICD-10-CM | POA: Diagnosis not present

## 2022-05-18 DIAGNOSIS — F17210 Nicotine dependence, cigarettes, uncomplicated: Secondary | ICD-10-CM

## 2022-05-18 DIAGNOSIS — Z8249 Family history of ischemic heart disease and other diseases of the circulatory system: Secondary | ICD-10-CM

## 2022-05-18 DIAGNOSIS — Z800 Family history of malignant neoplasm of digestive organs: Secondary | ICD-10-CM

## 2022-05-18 DIAGNOSIS — Z7982 Long term (current) use of aspirin: Secondary | ICD-10-CM | POA: Diagnosis not present

## 2022-05-18 DIAGNOSIS — M5136 Other intervertebral disc degeneration, lumbar region: Secondary | ICD-10-CM | POA: Diagnosis present

## 2022-05-18 DIAGNOSIS — Z91018 Allergy to other foods: Secondary | ICD-10-CM

## 2022-05-18 DIAGNOSIS — I509 Heart failure, unspecified: Secondary | ICD-10-CM

## 2022-05-18 DIAGNOSIS — E781 Pure hyperglyceridemia: Secondary | ICD-10-CM | POA: Diagnosis not present

## 2022-05-18 DIAGNOSIS — M5416 Radiculopathy, lumbar region: Secondary | ICD-10-CM | POA: Diagnosis not present

## 2022-05-18 DIAGNOSIS — Y838 Other surgical procedures as the cause of abnormal reaction of the patient, or of later complication, without mention of misadventure at the time of the procedure: Secondary | ICD-10-CM | POA: Diagnosis present

## 2022-05-18 DIAGNOSIS — E119 Type 2 diabetes mellitus without complications: Secondary | ICD-10-CM

## 2022-05-18 DIAGNOSIS — Y793 Surgical instruments, materials and orthopedic devices (including sutures) associated with adverse incidents: Secondary | ICD-10-CM | POA: Diagnosis present

## 2022-05-18 DIAGNOSIS — G2581 Restless legs syndrome: Secondary | ICD-10-CM | POA: Diagnosis present

## 2022-05-18 DIAGNOSIS — Z833 Family history of diabetes mellitus: Secondary | ICD-10-CM | POA: Diagnosis not present

## 2022-05-18 DIAGNOSIS — Z7984 Long term (current) use of oral hypoglycemic drugs: Secondary | ICD-10-CM

## 2022-05-18 DIAGNOSIS — Z9889 Other specified postprocedural states: Secondary | ICD-10-CM | POA: Diagnosis not present

## 2022-05-18 DIAGNOSIS — G4733 Obstructive sleep apnea (adult) (pediatric): Secondary | ICD-10-CM | POA: Diagnosis present

## 2022-05-18 DIAGNOSIS — Z79899 Other long term (current) drug therapy: Secondary | ICD-10-CM

## 2022-05-18 DIAGNOSIS — S32009A Unspecified fracture of unspecified lumbar vertebra, initial encounter for closed fracture: Secondary | ICD-10-CM | POA: Diagnosis not present

## 2022-05-18 DIAGNOSIS — Z7985 Long-term (current) use of injectable non-insulin antidiabetic drugs: Secondary | ICD-10-CM

## 2022-05-18 DIAGNOSIS — Z888 Allergy status to other drugs, medicaments and biological substances status: Secondary | ICD-10-CM | POA: Diagnosis not present

## 2022-05-18 DIAGNOSIS — M5116 Intervertebral disc disorders with radiculopathy, lumbar region: Secondary | ICD-10-CM | POA: Diagnosis not present

## 2022-05-18 DIAGNOSIS — M960 Pseudarthrosis after fusion or arthrodesis: Secondary | ICD-10-CM | POA: Diagnosis not present

## 2022-05-18 DIAGNOSIS — S32009K Unspecified fracture of unspecified lumbar vertebra, subsequent encounter for fracture with nonunion: Principal | ICD-10-CM | POA: Diagnosis present

## 2022-05-18 DIAGNOSIS — Z882 Allergy status to sulfonamides status: Secondary | ICD-10-CM

## 2022-05-18 DIAGNOSIS — M79605 Pain in left leg: Secondary | ICD-10-CM | POA: Diagnosis present

## 2022-05-18 DIAGNOSIS — I10 Essential (primary) hypertension: Secondary | ICD-10-CM | POA: Diagnosis present

## 2022-05-18 DIAGNOSIS — E1140 Type 2 diabetes mellitus with diabetic neuropathy, unspecified: Secondary | ICD-10-CM | POA: Diagnosis not present

## 2022-05-18 DIAGNOSIS — F17200 Nicotine dependence, unspecified, uncomplicated: Secondary | ICD-10-CM | POA: Diagnosis not present

## 2022-05-18 LAB — TYPE AND SCREEN
ABO/RH(D): A POS
Antibody Screen: NEGATIVE

## 2022-05-18 LAB — GLUCOSE, CAPILLARY
Glucose-Capillary: 132 mg/dL — ABNORMAL HIGH (ref 70–99)
Glucose-Capillary: 138 mg/dL — ABNORMAL HIGH (ref 70–99)

## 2022-05-18 SURGERY — POSTERIOR LUMBAR FUSION 2 WITH HARDWARE REMOVAL
Anesthesia: General | Site: Back

## 2022-05-18 MED ORDER — ASPIRIN EFFERVESCENT 325 MG PO TBEF: 650.0000 mg | EFFERVESCENT_TABLET | Freq: Four times a day (QID) | ORAL | Status: DC | PRN

## 2022-05-18 MED ORDER — GABAPENTIN 300 MG PO CAPS
300.0000 mg | ORAL_CAPSULE | Freq: Once | ORAL | Status: DC
  Filled 2022-05-18: qty 1

## 2022-05-18 MED ORDER — HYDROCHLOROTHIAZIDE 12.5 MG PO TABS
12.5000 mg | ORAL_TABLET | Freq: Every day | ORAL | Status: DC
  Administered 2022-05-18 – 2022-05-19 (×2): 12.5 mg via ORAL
  Filled 2022-05-18 (×3): qty 1

## 2022-05-18 MED ORDER — SODIUM CHLORIDE 0.9% FLUSH: 3.0000 mL | INTRAVENOUS | Status: DC | PRN

## 2022-05-18 MED ORDER — DEXAMETHASONE SODIUM PHOSPHATE 10 MG/ML IJ SOLN
INTRAMUSCULAR | Status: AC
  Filled 2022-05-18: qty 1

## 2022-05-18 MED ORDER — CYCLOBENZAPRINE HCL 10 MG PO TABS: 10.0000 mg | ORAL_TABLET | Freq: Three times a day (TID) | ORAL | Status: DC | PRN

## 2022-05-18 MED ORDER — ONDANSETRON HCL 4 MG/2ML IJ SOLN
INTRAMUSCULAR | Status: AC
  Filled 2022-05-18: qty 2

## 2022-05-18 MED ORDER — TIRZEPATIDE 2.5 MG/0.5ML ~~LOC~~ SOAJ: 2.5000 mg | SUBCUTANEOUS | Status: DC

## 2022-05-18 MED ORDER — MIDAZOLAM HCL 2 MG/2ML IJ SOLN
INTRAMUSCULAR | Status: AC
  Filled 2022-05-18: qty 2

## 2022-05-18 MED ORDER — LIDOCAINE-EPINEPHRINE 1 %-1:100000 IJ SOLN
INTRAMUSCULAR | Status: AC
  Filled 2022-05-18: qty 1

## 2022-05-18 MED ORDER — CEFAZOLIN SODIUM 1 G IJ SOLR
INTRAMUSCULAR | Status: AC
  Filled 2022-05-18: qty 30

## 2022-05-18 MED ORDER — LIDOCAINE 2% (20 MG/ML) 5 ML SYRINGE
INTRAMUSCULAR | Status: AC
  Filled 2022-05-18: qty 5

## 2022-05-18 MED ORDER — PROPOFOL 10 MG/ML IV BOLUS
INTRAVENOUS | Status: DC | PRN
  Administered 2022-05-18: 200 mg via INTRAVENOUS

## 2022-05-18 MED ORDER — SODIUM CHLORIDE 0.9 % IV SOLN
250.0000 mL | INTRAVENOUS | Status: DC
  Administered 2022-05-18: 250 mL via INTRAVENOUS

## 2022-05-18 MED ORDER — LISINOPRIL 20 MG PO TABS
40.0000 mg | ORAL_TABLET | Freq: Every day | ORAL | Status: DC
  Administered 2022-05-18 – 2022-05-20 (×3): 40 mg via ORAL
  Filled 2022-05-18 (×3): qty 2

## 2022-05-18 MED ORDER — 0.9 % SODIUM CHLORIDE (POUR BTL) OPTIME
TOPICAL | Status: DC | PRN
  Administered 2022-05-18: 1000 mL

## 2022-05-18 MED ORDER — PHENYLEPHRINE HCL (PRESSORS) 10 MG/ML IV SOLN
INTRAVENOUS | Status: DC | PRN
  Administered 2022-05-18: 80 ug via INTRAVENOUS
  Administered 2022-05-18: 160 ug via INTRAVENOUS
  Administered 2022-05-18: 80 ug via INTRAVENOUS

## 2022-05-18 MED ORDER — VITAMIN C 500 MG PO TABS
500.0000 mg | ORAL_TABLET | Freq: Every day | ORAL | Status: DC
  Administered 2022-05-18 – 2022-05-20 (×3): 500 mg via ORAL
  Filled 2022-05-18 (×3): qty 1

## 2022-05-18 MED ORDER — DEXAMETHASONE SODIUM PHOSPHATE 10 MG/ML IJ SOLN
INTRAMUSCULAR | Status: DC | PRN
  Administered 2022-05-18: 10 mg via INTRAVENOUS

## 2022-05-18 MED ORDER — OXYCODONE HCL 5 MG PO TABS: 5.0000 mg | ORAL_TABLET | Freq: Once | ORAL | Status: DC | PRN

## 2022-05-18 MED ORDER — CITALOPRAM HYDROBROMIDE 40 MG PO TABS
40.0000 mg | ORAL_TABLET | Freq: Every day | ORAL | Status: DC
  Administered 2022-05-19 – 2022-05-20 (×2): 40 mg via ORAL
  Filled 2022-05-18 (×2): qty 1

## 2022-05-18 MED ORDER — ACETAMINOPHEN 10 MG/ML IV SOLN
INTRAVENOUS | Status: AC
  Filled 2022-05-18: qty 100

## 2022-05-18 MED ORDER — ACETAMINOPHEN 500 MG PO TABS
1000.0000 mg | ORAL_TABLET | Freq: Once | ORAL | Status: DC
  Filled 2022-05-18: qty 2

## 2022-05-18 MED ORDER — EPHEDRINE SULFATE (PRESSORS) 50 MG/ML IJ SOLN
INTRAMUSCULAR | Status: DC | PRN
  Administered 2022-05-18 (×3): 5 mg via INTRAVENOUS

## 2022-05-18 MED ORDER — ASPIRIN 81 MG PO TBEC
81.0000 mg | DELAYED_RELEASE_TABLET | Freq: Every day | ORAL | Status: DC
  Administered 2022-05-18 – 2022-05-20 (×3): 81 mg via ORAL
  Filled 2022-05-18 (×3): qty 1

## 2022-05-18 MED ORDER — ROCURONIUM BROMIDE 100 MG/10ML IV SOLN
INTRAVENOUS | Status: DC | PRN
  Administered 2022-05-18 (×3): 20 mg via INTRAVENOUS
  Administered 2022-05-18: 80 mg via INTRAVENOUS
  Administered 2022-05-18: 30 mg via INTRAVENOUS
  Administered 2022-05-18: 20 mg via INTRAVENOUS

## 2022-05-18 MED ORDER — TIZANIDINE HCL 4 MG PO TABS: 4.0000 mg | ORAL_TABLET | Freq: Three times a day (TID) | ORAL | Status: DC | PRN

## 2022-05-18 MED ORDER — LIDOCAINE-EPINEPHRINE 1 %-1:100000 IJ SOLN
INTRAMUSCULAR | Status: DC | PRN
  Administered 2022-05-18: 6 mL

## 2022-05-18 MED ORDER — ONDANSETRON HCL 4 MG/2ML IJ SOLN
INTRAMUSCULAR | Status: DC | PRN
  Administered 2022-05-18: 4 mg via INTRAVENOUS

## 2022-05-18 MED ORDER — PANTOPRAZOLE SODIUM 40 MG IV SOLR
40.0000 mg | Freq: Every day | INTRAVENOUS | Status: DC
  Administered 2022-05-18: 40 mg via INTRAVENOUS
  Filled 2022-05-18: qty 10

## 2022-05-18 MED ORDER — VICKS VAPOINHALER IN INHA: Freq: Every day | RESPIRATORY_TRACT | Status: DC | PRN

## 2022-05-18 MED ORDER — FENTANYL CITRATE (PF) 250 MCG/5ML IJ SOLN
INTRAMUSCULAR | Status: DC | PRN
  Administered 2022-05-18: 50 ug via INTRAVENOUS
  Administered 2022-05-18: 25 ug via INTRAVENOUS
  Administered 2022-05-18 (×3): 50 ug via INTRAVENOUS
  Administered 2022-05-18: 150 ug via INTRAVENOUS

## 2022-05-18 MED ORDER — DEXTROSE 5 % IV SOLN
INTRAVENOUS | Status: DC | PRN
  Administered 2022-05-18: 3 g via INTRAVENOUS

## 2022-05-18 MED ORDER — ACETAMINOPHEN 650 MG RE SUPP: 650.0000 mg | RECTAL | Status: DC | PRN

## 2022-05-18 MED ORDER — LACTATED RINGERS IV SOLN: INTRAVENOUS | Status: DC | PRN

## 2022-05-18 MED ORDER — METFORMIN HCL ER 500 MG PO TB24
500.0000 mg | ORAL_TABLET | Freq: Every day | ORAL | Status: DC
  Filled 2022-05-18 (×2): qty 1

## 2022-05-18 MED ORDER — THROMBIN 20000 UNITS EX SOLR
CUTANEOUS | Status: AC
  Filled 2022-05-18: qty 20000

## 2022-05-18 MED ORDER — AMISULPRIDE (ANTIEMETIC) 5 MG/2ML IV SOLN: 10.0000 mg | Freq: Once | INTRAVENOUS | Status: DC | PRN

## 2022-05-18 MED ORDER — ROCURONIUM BROMIDE 10 MG/ML (PF) SYRINGE
PREFILLED_SYRINGE | INTRAVENOUS | Status: AC
  Filled 2022-05-18: qty 10

## 2022-05-18 MED ORDER — SUCCINYLCHOLINE CHLORIDE 200 MG/10ML IV SOSY
PREFILLED_SYRINGE | INTRAVENOUS | Status: AC
  Filled 2022-05-18: qty 10

## 2022-05-18 MED ORDER — PHENYLEPHRINE HCL-NACL 20-0.9 MG/250ML-% IV SOLN
INTRAVENOUS | Status: DC | PRN
  Administered 2022-05-18: 50 ug/min via INTRAVENOUS

## 2022-05-18 MED ORDER — SODIUM CHLORIDE 0.9% FLUSH
3.0000 mL | Freq: Two times a day (BID) | INTRAVENOUS | Status: DC
  Administered 2022-05-18 – 2022-05-20 (×3): 3 mL via INTRAVENOUS

## 2022-05-18 MED ORDER — ONDANSETRON HCL 4 MG PO TABS: 4.0000 mg | ORAL_TABLET | Freq: Four times a day (QID) | ORAL | Status: DC | PRN

## 2022-05-18 MED ORDER — BUPIVACAINE LIPOSOME 1.3 % IJ SUSP
INTRAMUSCULAR | Status: DC | PRN
  Administered 2022-05-18: 20 mL

## 2022-05-18 MED ORDER — HYDROCHLOROTHIAZIDE 12.5 MG PO CAPS
12.5000 mg | ORAL_CAPSULE | Freq: Every day | ORAL | Status: DC
  Filled 2022-05-18: qty 1

## 2022-05-18 MED ORDER — ACETAMINOPHEN 10 MG/ML IV SOLN
INTRAVENOUS | Status: DC | PRN
  Administered 2022-05-18: 1000 mg via INTRAVENOUS

## 2022-05-18 MED ORDER — ZINC GLUCONATE 50 MG PO TABS: 50.0000 mg | ORAL_TABLET | Freq: Every day | ORAL | Status: DC

## 2022-05-18 MED ORDER — OXYCODONE HCL 5 MG/5ML PO SOLN: 5.0000 mg | Freq: Once | ORAL | Status: DC | PRN

## 2022-05-18 MED ORDER — SUGAMMADEX SODIUM 200 MG/2ML IV SOLN
INTRAVENOUS | Status: DC | PRN
  Administered 2022-05-18 (×3): 100 mg via INTRAVENOUS

## 2022-05-18 MED ORDER — THROMBIN 20000 UNITS EX SOLR
CUTANEOUS | Status: DC | PRN
  Administered 2022-05-18: 1 mL via TOPICAL

## 2022-05-18 MED ORDER — ORAL CARE MOUTH RINSE: 15.0000 mL | Freq: Once | OROMUCOSAL | Status: AC

## 2022-05-18 MED ORDER — MENTHOL 3 MG MT LOZG: 1.0000 | LOZENGE | OROMUCOSAL | Status: DC | PRN

## 2022-05-18 MED ORDER — LIDOCAINE HCL (CARDIAC) PF 100 MG/5ML IV SOSY
PREFILLED_SYRINGE | INTRAVENOUS | Status: DC | PRN
  Administered 2022-05-18: 100 mg via INTRATRACHEAL

## 2022-05-18 MED ORDER — OXYCODONE-ACETAMINOPHEN 5-325 MG PO TABS
1.0000 | ORAL_TABLET | Freq: Four times a day (QID) | ORAL | Status: DC | PRN
  Administered 2022-05-19: 1 via ORAL
  Filled 2022-05-18: qty 1

## 2022-05-18 MED ORDER — PROPOFOL 10 MG/ML IV BOLUS
INTRAVENOUS | Status: AC
  Filled 2022-05-18: qty 20

## 2022-05-18 MED ORDER — LORATADINE 10 MG PO TABS
10.0000 mg | ORAL_TABLET | Freq: Every day | ORAL | Status: DC
  Administered 2022-05-18 – 2022-05-20 (×3): 10 mg via ORAL
  Filled 2022-05-18 (×3): qty 1

## 2022-05-18 MED ORDER — MEPERIDINE HCL 25 MG/ML IJ SOLN: 6.2500 mg | INTRAMUSCULAR | Status: DC | PRN

## 2022-05-18 MED ORDER — ACETAMINOPHEN 325 MG PO TABS: 650.0000 mg | ORAL_TABLET | ORAL | Status: DC | PRN

## 2022-05-18 MED ORDER — FENTANYL CITRATE (PF) 250 MCG/5ML IJ SOLN
INTRAMUSCULAR | Status: AC
  Filled 2022-05-18: qty 5

## 2022-05-18 MED ORDER — ROCURONIUM BROMIDE 10 MG/ML (PF) SYRINGE
PREFILLED_SYRINGE | INTRAVENOUS | Status: AC
  Filled 2022-05-18: qty 30

## 2022-05-18 MED ORDER — OXYCODONE HCL 5 MG PO TABS
5.0000 mg | ORAL_TABLET | Freq: Four times a day (QID) | ORAL | Status: DC | PRN
  Administered 2022-05-18: 5 mg via ORAL
  Filled 2022-05-18: qty 1

## 2022-05-18 MED ORDER — OXYCODONE-ACETAMINOPHEN 10-325 MG PO TABS: 1.0000 | ORAL_TABLET | Freq: Four times a day (QID) | ORAL | Status: DC | PRN

## 2022-05-18 MED ORDER — BUPIVACAINE LIPOSOME 1.3 % IJ SUSP
INTRAMUSCULAR | Status: AC
  Filled 2022-05-18: qty 20

## 2022-05-18 MED ORDER — FENOFIBRATE 160 MG PO TABS
160.0000 mg | ORAL_TABLET | Freq: Every day | ORAL | Status: DC
  Administered 2022-05-19 – 2022-05-20 (×2): 160 mg via ORAL
  Filled 2022-05-18 (×2): qty 1

## 2022-05-18 MED ORDER — HYDROMORPHONE HCL 1 MG/ML IJ SOLN
0.5000 mg | INTRAMUSCULAR | Status: DC | PRN
  Administered 2022-05-18 – 2022-05-20 (×13): 0.5 mg via INTRAVENOUS
  Filled 2022-05-18 (×13): qty 0.5

## 2022-05-18 MED ORDER — FESOTERODINE FUMARATE ER 4 MG PO TB24
4.0000 mg | ORAL_TABLET | Freq: Every day | ORAL | Status: DC
  Administered 2022-05-19: 4 mg via ORAL
  Filled 2022-05-18 (×3): qty 1

## 2022-05-18 MED ORDER — MAGNESIUM OXIDE -MG SUPPLEMENT 400 (240 MG) MG PO TABS
400.0000 mg | ORAL_TABLET | Freq: Every day | ORAL | Status: DC
  Administered 2022-05-18 – 2022-05-20 (×3): 400 mg via ORAL
  Filled 2022-05-18 (×3): qty 1

## 2022-05-18 MED ORDER — VASOPRESSIN 20 UNIT/ML IV SOLN
INTRAVENOUS | Status: AC
  Filled 2022-05-18: qty 1

## 2022-05-18 MED ORDER — PHENYLEPHRINE 80 MCG/ML (10ML) SYRINGE FOR IV PUSH (FOR BLOOD PRESSURE SUPPORT)
PREFILLED_SYRINGE | INTRAVENOUS | Status: AC
  Filled 2022-05-18: qty 20

## 2022-05-18 MED ORDER — ROSUVASTATIN CALCIUM 20 MG PO TABS
20.0000 mg | ORAL_TABLET | Freq: Every day | ORAL | Status: DC
  Administered 2022-05-19 – 2022-05-20 (×2): 20 mg via ORAL
  Filled 2022-05-18 (×2): qty 1

## 2022-05-18 MED ORDER — ONDANSETRON HCL 4 MG/2ML IJ SOLN: 4.0000 mg | Freq: Four times a day (QID) | INTRAMUSCULAR | Status: DC | PRN

## 2022-05-18 MED ORDER — TAMSULOSIN HCL 0.4 MG PO CAPS
0.4000 mg | ORAL_CAPSULE | Freq: Every day | ORAL | Status: DC
  Administered 2022-05-19 – 2022-05-20 (×2): 0.4 mg via ORAL
  Filled 2022-05-18 (×2): qty 1

## 2022-05-18 MED ORDER — ALUM & MAG HYDROXIDE-SIMETH 200-200-20 MG/5ML PO SUSP: 30.0000 mL | Freq: Four times a day (QID) | ORAL | Status: DC | PRN

## 2022-05-18 MED ORDER — AMLODIPINE BESYLATE 10 MG PO TABS
10.0000 mg | ORAL_TABLET | Freq: Every day | ORAL | Status: DC
  Administered 2022-05-19 – 2022-05-20 (×2): 10 mg via ORAL
  Filled 2022-05-18 (×2): qty 1

## 2022-05-18 MED ORDER — GABAPENTIN 300 MG PO CAPS
900.0000 mg | ORAL_CAPSULE | Freq: Three times a day (TID) | ORAL | Status: DC
  Administered 2022-05-18 – 2022-05-20 (×5): 900 mg via ORAL
  Filled 2022-05-18 (×5): qty 3

## 2022-05-18 MED ORDER — KETAMINE HCL 10 MG/ML IJ SOLN
INTRAMUSCULAR | Status: DC | PRN
  Administered 2022-05-18: 10 mg via INTRAVENOUS
  Administered 2022-05-18: 20 mg via INTRAVENOUS

## 2022-05-18 MED ORDER — EPHEDRINE 5 MG/ML INJ
INTRAVENOUS | Status: AC
  Filled 2022-05-18: qty 10

## 2022-05-18 MED ORDER — CEFAZOLIN SODIUM-DEXTROSE 2-4 GM/100ML-% IV SOLN
2.0000 g | Freq: Three times a day (TID) | INTRAVENOUS | Status: DC
  Administered 2022-05-18 – 2022-05-20 (×5): 2 g via INTRAVENOUS
  Filled 2022-05-18 (×5): qty 100

## 2022-05-18 MED ORDER — PHENOL 1.4 % MT LIQD: 1.0000 | OROMUCOSAL | Status: DC | PRN

## 2022-05-18 MED ORDER — POTASSIUM CHLORIDE CRYS ER 20 MEQ PO TBCR
20.0000 meq | EXTENDED_RELEASE_TABLET | Freq: Every day | ORAL | Status: DC
  Administered 2022-05-18 – 2022-05-20 (×3): 20 meq via ORAL
  Filled 2022-05-18 (×3): qty 1

## 2022-05-18 MED ORDER — HYDROMORPHONE HCL 1 MG/ML IJ SOLN: 0.2500 mg | INTRAMUSCULAR | Status: DC | PRN

## 2022-05-18 MED ORDER — PROMETHAZINE HCL 25 MG/ML IJ SOLN: 6.2500 mg | INTRAMUSCULAR | Status: DC | PRN

## 2022-05-18 MED ORDER — LACTATED RINGERS IV SOLN: INTRAVENOUS | Status: DC

## 2022-05-18 MED ORDER — CHLORHEXIDINE GLUCONATE 0.12 % MT SOLN
15.0000 mL | Freq: Once | OROMUCOSAL | Status: AC
  Administered 2022-05-18: 15 mL via OROMUCOSAL
  Filled 2022-05-18: qty 15

## 2022-05-18 MED ORDER — INSULIN ASPART 100 UNIT/ML IJ SOLN
0.0000 [IU] | Freq: Three times a day (TID) | INTRAMUSCULAR | Status: DC
  Administered 2022-05-19: 2 [IU] via SUBCUTANEOUS
  Administered 2022-05-19: 5 [IU] via SUBCUTANEOUS
  Administered 2022-05-19: 3 [IU] via SUBCUTANEOUS

## 2022-05-18 MED ORDER — FLUTICASONE PROPIONATE 50 MCG/ACT NA SUSP: 2.0000 | Freq: Every day | NASAL | Status: DC | PRN

## 2022-05-18 MED ORDER — VITAMIN B-12 1000 MCG PO TABS
1000.0000 ug | ORAL_TABLET | Freq: Every day | ORAL | Status: DC
  Administered 2022-05-18 – 2022-05-20 (×3): 1000 ug via ORAL
  Filled 2022-05-18 (×3): qty 1

## 2022-05-18 MED ORDER — KETAMINE HCL 50 MG/5ML IJ SOSY
PREFILLED_SYRINGE | INTRAMUSCULAR | Status: AC
  Filled 2022-05-18: qty 5

## 2022-05-18 SURGICAL SUPPLY — 81 items
BAG COUNTER SPONGE SURGICOUNT (BAG) ×1 IMPLANT
BAND RUBBER #18 3X1/16 STRL (MISCELLANEOUS) IMPLANT
BASKET BONE COLLECTION (BASKET) ×1 IMPLANT
BENZOIN TINCTURE PRP APPL 2/3 (GAUZE/BANDAGES/DRESSINGS) ×1 IMPLANT
BLADE BONE MILL MEDIUM (MISCELLANEOUS) ×1 IMPLANT
BLADE CLIPPER SURG (BLADE) IMPLANT
BLADE SURG 11 STRL SS (BLADE) ×1 IMPLANT
BONE FIBERS PLIAFX 10 (Bone Implant) ×1 IMPLANT
BONE FIBERS PLIAFX 5ML (Bone Implant) ×1 IMPLANT
BONE VIVIGEN FORMABLE 5.4CC (Bone Implant) ×1 IMPLANT
BUR CUTTER 7.0 ROUND (BURR) ×1 IMPLANT
BUR MATCHSTICK NEURO 3.0 LAGG (BURR) ×1 IMPLANT
CANISTER SUCT 3000ML PPV (MISCELLANEOUS) ×1 IMPLANT
CAP LOCKING THREADED (Cap) IMPLANT
CNTNR URN SCR LID CUP LEK RST (MISCELLANEOUS) ×1 IMPLANT
CONT SPEC 4OZ STRL OR WHT (MISCELLANEOUS) ×1
COVER BACK TABLE 24X17X13 BIG (DRAPES) IMPLANT
COVER BACK TABLE 60X90IN (DRAPES) ×1 IMPLANT
CROSSLINK SPINAL FUSION (Cage) IMPLANT
DERMABOND ADVANCED .7 DNX12 (GAUZE/BANDAGES/DRESSINGS) ×1 IMPLANT
DRAPE C-ARM 42X72 X-RAY (DRAPES) ×1 IMPLANT
DRAPE C-ARMOR (DRAPES) IMPLANT
DRAPE HALF SHEET 40X57 (DRAPES) IMPLANT
DRAPE LAPAROTOMY 100X72X124 (DRAPES) ×1 IMPLANT
DRAPE MICROSCOPE LEICA (MISCELLANEOUS) IMPLANT
DRAPE SURG 17X23 STRL (DRAPES) ×1 IMPLANT
DRSG OPSITE 4X5.5 SM (GAUZE/BANDAGES/DRESSINGS) ×1 IMPLANT
DRSG OPSITE POSTOP 4X6 (GAUZE/BANDAGES/DRESSINGS) ×1 IMPLANT
DRSG OPSITE POSTOP 4X8 (GAUZE/BANDAGES/DRESSINGS) IMPLANT
DURAPREP 26ML APPLICATOR (WOUND CARE) ×1 IMPLANT
ELECT BLADE 4.0 EZ CLEAN MEGAD (MISCELLANEOUS) ×1
ELECT REM PT RETURN 9FT ADLT (ELECTROSURGICAL) ×1
ELECTRODE BLDE 4.0 EZ CLN MEGD (MISCELLANEOUS) IMPLANT
ELECTRODE REM PT RTRN 9FT ADLT (ELECTROSURGICAL) ×1 IMPLANT
EVACUATOR 1/8 PVC DRAIN (DRAIN) IMPLANT
EVACUATOR 3/16  PVC DRAIN (DRAIN) ×1
EVACUATOR 3/16 PVC DRAIN (DRAIN) ×1 IMPLANT
GAUZE 4X4 16PLY ~~LOC~~+RFID DBL (SPONGE) IMPLANT
GAUZE SPONGE 4X4 12PLY STRL (GAUZE/BANDAGES/DRESSINGS) ×1 IMPLANT
GLOVE BIO SURGEON STRL SZ7 (GLOVE) IMPLANT
GLOVE BIO SURGEON STRL SZ8 (GLOVE) ×2 IMPLANT
GLOVE BIOGEL PI IND STRL 7.0 (GLOVE) IMPLANT
GLOVE EXAM NITRILE XL STR (GLOVE) IMPLANT
GLOVE INDICATOR 8.5 STRL (GLOVE) ×4 IMPLANT
GOWN STRL REUS W/ TWL LRG LVL3 (GOWN DISPOSABLE) IMPLANT
GOWN STRL REUS W/ TWL XL LVL3 (GOWN DISPOSABLE) ×2 IMPLANT
GOWN STRL REUS W/TWL 2XL LVL3 (GOWN DISPOSABLE) IMPLANT
GOWN STRL REUS W/TWL LRG LVL3 (GOWN DISPOSABLE)
GOWN STRL REUS W/TWL XL LVL3 (GOWN DISPOSABLE) ×2
GRAFT BNE FBR PLIAFX PRIME 10 (Bone Implant) IMPLANT
GRAFT BNE FBR PLIAFX PRIME 5 (Bone Implant) IMPLANT
GRAFT BNE MATRIX VG FRMBL MD 5 (Bone Implant) IMPLANT
KIT BASIN OR (CUSTOM PROCEDURE TRAY) ×1 IMPLANT
KIT GRAFTMAG DEL NEURO DISP (NEUROSURGERY SUPPLIES) IMPLANT
KIT INFUSE SMALL (Orthopedic Implant) IMPLANT
KIT TURNOVER KIT B (KITS) ×1 IMPLANT
MILL BONE PREP (MISCELLANEOUS) ×1 IMPLANT
NDL HYPO 21X1.5 SAFETY (NEEDLE) ×1 IMPLANT
NDL HYPO 25X1 1.5 SAFETY (NEEDLE) ×1 IMPLANT
NEEDLE HYPO 21X1.5 SAFETY (NEEDLE) ×1 IMPLANT
NEEDLE HYPO 25X1 1.5 SAFETY (NEEDLE) ×1 IMPLANT
NS IRRIG 1000ML POUR BTL (IV SOLUTION) ×1 IMPLANT
PACK LAMINECTOMY NEURO (CUSTOM PROCEDURE TRAY) ×1 IMPLANT
PAD ARMBOARD 7.5X6 YLW CONV (MISCELLANEOUS) ×3 IMPLANT
ROD 40MM SPINAL (Rod) IMPLANT
SCREW CANN 9.5X50 COBALT (Screw) IMPLANT
SCREW SET SOLERA (Screw) ×1 IMPLANT
SCREW SET SOLERA TI5.5 (Screw) IMPLANT
SPIKE FLUID TRANSFER (MISCELLANEOUS) ×1 IMPLANT
SPONGE SURGIFOAM ABS GEL 100 (HEMOSTASIS) ×1 IMPLANT
SPONGE T-LAP 4X18 ~~LOC~~+RFID (SPONGE) IMPLANT
STRIP CLOSURE SKIN 1/2X4 (GAUZE/BANDAGES/DRESSINGS) ×2 IMPLANT
SUT VIC AB 0 CT1 18XCR BRD8 (SUTURE) ×1 IMPLANT
SUT VIC AB 0 CT1 8-18 (SUTURE) ×2
SUT VIC AB 2-0 CT1 18 (SUTURE) ×1 IMPLANT
SUT VIC AB 4-0 PS2 27 (SUTURE) ×1 IMPLANT
SYR 20ML LL LF (SYRINGE) ×1 IMPLANT
TOWEL GREEN STERILE (TOWEL DISPOSABLE) ×1 IMPLANT
TOWEL GREEN STERILE FF (TOWEL DISPOSABLE) ×1 IMPLANT
TRAY FOLEY MTR SLVR 16FR STAT (SET/KITS/TRAYS/PACK) ×1 IMPLANT
WATER STERILE IRR 1000ML POUR (IV SOLUTION) ×1 IMPLANT

## 2022-05-18 NOTE — H&P (Signed)
Harold [name] is an 57 y.o. male.   Chief Complaint: Back and left leg pain HPI: 57 year old gentleman previous history of L3-L5 decompression fusion with pseudoarthrosis and revision presents now with progressive worsening back and left leg pain workup is revealed cell fusion at 3 4 but probable pseudoarthrosis again at L4-5 with some potential migration of the left-sided interbody cage.  Patient presents with left L5 radicular symptoms and back pain with loosening of his L3 screws and loosening of his left L5 screw.  Due to patient progression of clinical syndrome imaging findings of a conservative treatment I recommend exploration fusion removal of hardware with removal of bilateral L3 screws removal of left L5 screw redo foraminotomies on the left at L5 and possible replacement of the left L5 screw and redo posterior lateral arthrodesis L3-4 and L4-5.  I have extensively gone over the risks and benefits of that operation with him as well as perioperative course expectations of outcome and alternatives of surgery and he understood and agreed to proceed forward.  Past Medical History:  Diagnosis Date   Acute idiopathic pericarditis 04/08/2010   Qualifier: Diagnosis of  By: [name], [name]     Acute sinusitis 06/04/2014   Anxiety    CHF (congestive heart failure) ([place]) 06/29/2010   was pericardititis not CHF   Chronic back pain    Constipation    DDD (degenerative disc disease), lumbar 09/14/2012   Depression    Erectile dysfunction 09/14/2012   GERD (gastroesophageal reflux disease)    ocassional   Gout, unspecified 05/01/2007   Qualifier: Diagnosis of  By: [name] MD, Dellis Filbert A    Headache    migraines in the past   History of pancreatitis    a. admx 04-2009.Marland KitchenMarland Kitchen? 2-2 triglycerides   Hypertension    Hypertriglyceridemia    a. followed by [facility]   Left sided sciatica 09/14/2012   Neuropathy    Obesity    OSA (obstructive sleep apnea)    CPAP- not current   Restless legs    Type 2  diabetes mellitus with diabetic neuropathy, unspecified ([place]) 09/20/2019    Past Surgical History:  Procedure Laterality Date   ANTERIOR CERVICAL DECOMP/DISCECTOMY FUSION N/A 09/14/2020   Procedure: Anterior Cervical Decompression Fusion - Cervical three-Cervical four;  Surgeon: Kary Kos, MD;  Location: [place];  Service: Neurosurgery;  Laterality: N/A;   BACK SURGERY  2021   lumbar fusion   COLONOSCOPY     ELBOW SURGERY Left    KNEE SURGERY Right    x3 scopes   KNEE SURGERY Right    open menisicus repair   LUMBAR LAMINECTOMY/DECOMPRESSION MICRODISCECTOMY N/A 08/26/2015   Procedure: Lumbar Two-Sacral One  Laminectomy for decompression;  Surgeon: [name], MD;  Location: [place] [facility];  Service: Neurosurgery;  Laterality: N/A;   pericardectomy  07/15/2010   Dr. [name]   pleurx catheter placement  07/15/2010   Dr. [name]   SHOULDER SURGERY     right and left shoulders- Rotator cuff repair   SHOULDER SURGERY Right 2023   TONSILLECTOMY AND ADENOIDECTOMY     one tonsil    Family History  Problem Relation Age of Onset   Diabetes Mother    Diabetes Father    Colon cancer Father 16   Heart disease Father 31       CABG   Pancreatitis Father    Hypertension Other        entire family   Liver cancer Paternal Uncle  Diabetes Sister    Stomach cancer Maternal Grandfather    Pancreatitis Paternal Uncle        x 2   Esophageal cancer Neg Hx    Rectal cancer Neg Hx    Colon polyps Neg Hx    Social History:  reports that he has been smoking cigarettes. He has a 18.00 pack-year smoking history. He quit smokeless tobacco use about 3 years ago.  His smokeless tobacco use included snuff. He reports current alcohol use. He reports that he does not use drugs.  Allergies:  Allergies  Allergen Reactions   Diphenhydramine Palpitations and Other (See Comments)    "jittery" Pt states he can take Benadryl in an emergency situation   Beta Adrenergic Blockers Other (See  Comments)    Erectile dysfxn   Colchicine Diarrhea   Lipitor [Atorvastatin] Other (See Comments)    Muscle cramps   Pecan Nut (Diagnostic) Palpitations    Pecans: itching and swelling of the tongue    Septra [Sulfamethoxazole-Trimethoprim] Itching    Medications Prior to Admission  Medication Sig Dispense Refill   amLODipine (NORVASC) 10 MG tablet Take 1 tablet (10 mg total) by mouth daily. 90 tablet 3   Aromatic Inhalants (VICKS VAPOINHALER IN) Inhale 1 puff into the lungs daily as needed (congestion).     ascorbic acid (VITAMIN C) 500 MG tablet Take 500 mg by mouth daily.     aspirin EC 81 MG tablet Take 81 mg by mouth daily.     aspirin-sod bicarb-citric acid (ALKA-SELTZER) 325 MG TBEF tablet Take 650 mg by mouth every 6 (six) hours as needed (indigestion).     Cholecalciferol (VITAMIN D3 PO) Take 1 capsule by mouth daily.     citalopram (CELEXA) 40 MG tablet Take 1 tablet (40 mg total) by mouth daily. 90 tablet 3   cyclobenzaprine (FLEXERIL) 10 MG tablet Take 1 tablet (10 mg total) by mouth 3 (three) times daily as needed for muscle spasms. 30 tablet 2   diclofenac Sodium (VOLTAREN) 1 % GEL Apply 1 application topically 3 (three) times daily as needed (PAIN). 150 g 0   fenofibrate (TRICOR) 145 MG tablet Take 1 tablet (145 mg total) by mouth daily. TAKE 1 TABLET(145 MG) BY MOUTH DAILY 90 tablet 3   fluticasone (FLONASE) 50 MCG/ACT nasal spray Place 2 sprays into both nostrils daily. (Patient taking differently: Place 2 sprays into both nostrils daily as needed for allergies.) 16 g 6   gabapentin (NEURONTIN) 300 MG capsule Take 900 mg by mouth 3 (three) times daily.     hydrochlorothiazide (MICROZIDE) 12.5 MG capsule Take 1 capsule (12.5 mg total) by mouth daily. 90 capsule 3   ibuprofen (ADVIL) 800 MG tablet Take 800 mg by mouth 3 (three) times daily.     lisinopril (ZESTRIL) 40 MG tablet TAKE 1 TABLET(40 MG) BY MOUTH DAILY 90 tablet 3   loratadine (CLARITIN) 10 MG tablet TAKE 1  TABLET(10 MG) BY MOUTH DAILY AS NEEDED FOR ALLERGIES 90 tablet 3   Magnesium 500 MG TABS Take 500 mg by mouth daily.     oxyCODONE-acetaminophen (PERCOCET) 10-325 MG tablet Take 1 tablet by mouth every 4 (four) hours as needed for pain. (Patient taking differently: Take 1 tablet by mouth every 6 (six) hours as needed for pain.) 120 tablet 0   Phenylephrine-Acetaminophen (TYLENOL SINUS CONGESTION/PAIN PO) Take 2 tablets by mouth 3 (three) times daily as needed (congestion).     Potassium Chloride ER 20 MEQ TBCR Take 1 tablet by  mouth daily. 90 tablet 3   rosuvastatin (CRESTOR) 20 MG tablet Take 1 tablet (20 mg total) by mouth daily. TAKE 1 TABLET(20 MG) BY MOUTH DAILY 90 tablet 3   tadalafil (CIALIS) 20 MG tablet Take 0.5-1 tablets (10-20 mg total) by mouth every other day as needed for erectile dysfunction. 10 tablet 11   tamsulosin (FLOMAX) 0.4 MG CAPS capsule Take 1 capsule (0.4 mg total) by mouth daily. 90 capsule 3   tirzepatide (MOUNJARO) 2.5 MG/0.5ML Pen Inject 2.5 mg into the skin once a week. 6 mL 3   vitamin B-12 (CYANOCOBALAMIN) 1000 MCG tablet Take 1 tablet (1,000 mcg total) by mouth daily. 90 tablet 1   zinc gluconate 50 MG tablet Take 50 mg by mouth daily.     Blood Glucose Monitoring Suppl (ONE TOUCH ULTRA 2) w/Device KIT Use as directed daily E11.9 1 each 0   Continuous Blood Gluc Receiver (DEXCOM G7 RECEIVER) DEVI Use as directed once per day E11.9 1 each 0   Continuous Blood Gluc Sensor (DEXCOM G7 SENSOR) MISC Use as directed once every 2 weeks E11.9 6 each 3   glucose blood test strip Use as instructed once daily E119 100 each 12   Lancets MISC Use as directed daily E11.9 100 each 11   metFORMIN (GLUCOPHAGE-XR) 500 MG 24 hr tablet TAKE 2 TABLETS BY MOUTH DAILY WITH BREAKFAST (Patient not taking: Reported on 04/20/2022) 180 tablet 3   tiZANidine (ZANAFLEX) 4 MG tablet Take 4 mg by mouth 3 (three) times daily as needed.     tolterodine (DETROL LA) 4 MG 24 hr capsule Take 1 capsule (4  mg total) by mouth daily. (Patient not taking: Reported on 05/11/2022) 90 capsule 3    Results for orders placed or performed during the hospital encounter of 05/18/22 (from the past 48 hour(s))  Glucose, capillary     Status: Abnormal   Collection Time: 05/18/22 10:03 AM  Result Value Ref Range   Glucose-Capillary 132 (H) 70 - 99 mg/dL    Comment: Glucose reference range applies only to samples taken after fasting for at least 8 hours.   No results found.  Review of Systems  Musculoskeletal:  Positive for back pain.  Neurological:  Positive for numbness.    Blood pressure (!) 145/90, pulse 67, temperature 98.9 F (37.2 C), temperature source Oral, resp. rate 17, height 5' 11"$  (1.803 m), weight 117.9 kg, SpO2 97 %. Physical Exam HENT:     Head: Normocephalic.     Right Ear: Tympanic membrane normal.     Mouth/Throat:     Mouth: Mucous membranes are moist.  Eyes:     Pupils: Pupils are equal, round, and reactive to light.  Cardiovascular:     Rate and Rhythm: Normal rate.  Pulmonary:     Effort: Pulmonary effort is normal.  Abdominal:     General: Abdomen is flat.  Musculoskeletal:        General: Normal range of motion.     Cervical back: Normal range of motion.  Skin:    General: Skin is warm.  Neurological:     Mental Status: He is alert.     Comments: Strength 5 out of 5 psoas, quads, hamstrings, gastrocs, into tibialis, and EHL.      Assessment/Plan 57 year old presents for redo posterior lateral instrumented fusion primarily at L4-5 but also redo arthrodesis L3-4 with redo foraminotomies the left at L4-5.  Elaina Hoops, MD 05/18/2022, 10:39 AM

## 2022-05-18 NOTE — Op Note (Signed)
Preoperative diagnosis: Possible pseudoarthrosis L3-4 and pseudoarthrosis L4-5 left L5 radiculopathy.  Postoperative diagnosis: Same.  Procedure: #1 redo decompressive laminotomy and foraminotomy of the left L5 nerve root with repositioning of left interbody cage partial redo facetectomy.  Utilizing the operating microscope  2.  Exploration fusion removal of hardware with removal of bilateral L3 pedicle screws removal of rods and knots and removal of left L5 pedicle screw.  And replacement of left L5 with a 9.5 x 50 mm Medtronic Solera pedicle screw coupled this up with the previous global globus Creo amp modular cortical screw set  3.  Redo posterior lateral arthrodesis L3-4 L4-5 utilizing BMP cortical fibers and Vivigen.  Surgeon: Kary Kos.  Assistant: Ashok Pall.  Anesthesia: General.  EBL: 250.  HPI: 57 year old gentleman with history of previous L3-L5 fusion presented with worsening back and left L5 radicular pain workup revealed pseudoarthrosis at L4-5 probable solid fusion L3-4 with loosening of bilateral L3 screws and left L5 screw.  In addition it appeared the interbody cage may have migrated dorsally creating some foraminal stenosis.  Due to patient's progression of clinical syndrome imaging findings and failed conservative treatment I recommended redo posterior lateral arthrodesis with removal of hardware replacement of left L5 pedicle screw and redo laminotomy of the left L5 nerve root.  Operative procedure: Patient was brought into the OR was induced under general anesthesia positioned prone the Wilson frame his back was prepped and draped in routine sterile fashion.  The incision was opened up after infiltration of 7 cc lidocaine with epi scar tissue was dissected free exposed the hardware from L3-L5 disconnected the knots remove the rods both L3 screws were loose but the L3-4 fusion appeared to be solid I removed also the left L5 pedicle screw the other pedicle screws appear to  be solid.  At this point scar tissue was dissected free I ask exposed the medial pedicle wall and worked inferiorly from the inferior aspect of the medial left pedicle superiorly developing the plane between that and the dura and the L5 nerve root and into the disc base and under microscopic illumination performed a redo foraminotomies the left L5 nerve root getting the disc base identified the cage removed some graft and disc material from around the cage and then utilizing a tamp advanced the cage into the interspace to where it was not proud or dorsal to the posterior vertebral line.  Reinspected the foramina to confirm patency and decompression of the left L5 nerve root and thecal sac.  Then the wound was copiously irrigated I exposed the TPs at L3-L4 and L5 bilaterally I aggressively decorticated the TPs and facet joints and laid some additional posterior lateral bone at L4-5 utilizing BMP cortical fibers and Vivigen and I also extended this up to the transverse process at L3 just to augment that fusion although it did appear to be solid.  Do not replace bilateral L3 screws as it was not a lot of native bone to put in larger screws that I was able to replace the left L5 screw with a Medtronic 9.5 x 50 mm Solera pedicle screw and then anchored everything in place with new rods and new anchoring knots from L4-L5.  At this point Exparel was injected in the fascia placed a medium Hemovac drain and closed the wound in layers with interrupted Vicryl and a running 4 subcuticular.  Dermabond benzoin Steri-Strips and a sterile dressing was applied patient recovery in stable condition.  At the end the case all  needle counts and sponge counts were correct.

## 2022-05-18 NOTE — Progress Notes (Signed)
Pt transported on bipap from PCAU 12 to [id number] without complication. RT and RN accompanied patient.

## 2022-05-18 NOTE — Transfer of Care (Signed)
Immediate Anesthesia Transfer of Care Note  Patient: Harold [name]  Procedure(s) Performed: Lumbar fusion - Lumbar Three-Lumbar Four - Lumbar Four-Lumbar Five revision for pseudoarthrosis with removal of hardware replacement and redo decompression foraminotomy on the left at Lumbar Four-Five (Back)  Patient Location: PACU  Anesthesia Type:General  Level of Consciousness: drowsy  Airway & Oxygen Therapy: Patient Spontanous Breathing and Patient connected to face mask oxygen  Post-op Assessment: Report given to RN and Post -op Vital signs reviewed and stable  Post vital signs: Reviewed and stable  Last Vitals:  Vitals Value Taken Time  BP 158/79 05/18/22 1600  Temp    Pulse 79 05/18/22 1608  Resp 20 05/18/22 1608  SpO2 97 % 05/18/22 1608  Vitals shown include unvalidated device data.  Last Pain:  Vitals:   05/18/22 1059  TempSrc:   PainSc: 7          Complications: No notable events documented.

## 2022-05-18 NOTE — Anesthesia Procedure Notes (Signed)
Procedure Name: Intubation Date/Time: 05/18/2022 11:45 AM  Performed by: Lavell Luster, CRNAPre-anesthesia Checklist: Patient identified, Emergency Drugs available, Suction available, Patient being monitored and Timeout performed Patient Re-evaluated:Patient Re-evaluated prior to induction Oxygen Delivery Method: Circle system utilized Preoxygenation: Pre-oxygenation with 100% oxygen Induction Type: IV induction Ventilation: Mask ventilation without difficulty Laryngoscope Size: Mac and 4 Grade View: Grade I Tube type: Oral Tube size: 7.5 mm Number of attempts: 1 Airway Equipment and Method: Stylet Placement Confirmation: ETT inserted through vocal cords under direct vision, positive ETCO2 and breath sounds checked- equal and bilateral Secured at: 22 cm Tube secured with: Tape Dental Injury: Teeth and Oropharynx as per pre-operative assessment

## 2022-05-18 NOTE — Progress Notes (Signed)
Pt met in PACU 12 and placed on bipap. Pt with history of OSA. Small amount of thin, frothy, bloody secretions suctioned from pt mouth without complication. RT will continue to be available as needed.

## 2022-05-19 LAB — GLUCOSE, CAPILLARY
Glucose-Capillary: 117 mg/dL — ABNORMAL HIGH (ref 70–99)
Glucose-Capillary: 121 mg/dL — ABNORMAL HIGH (ref 70–99)
Glucose-Capillary: 203 mg/dL — ABNORMAL HIGH (ref 70–99)
Glucose-Capillary: 161 mg/dL — ABNORMAL HIGH (ref 70–99)
Glucose-Capillary: 228 mg/dL — ABNORMAL HIGH (ref 70–99)

## 2022-05-19 MED ORDER — OXYCODONE HCL 5 MG PO TABS
15.0000 mg | ORAL_TABLET | ORAL | Status: DC | PRN
  Administered 2022-05-20: 15 mg via ORAL
  Filled 2022-05-19 (×2): qty 3

## 2022-05-19 MED ORDER — PANTOPRAZOLE SODIUM 40 MG PO TBEC
40.0000 mg | DELAYED_RELEASE_TABLET | Freq: Every day | ORAL | Status: DC
  Administered 2022-05-19 – 2022-05-20 (×2): 40 mg via ORAL
  Filled 2022-05-19 (×2): qty 1

## 2022-05-19 NOTE — Evaluation (Signed)
Occupational Therapy Evaluation/Discharge Patient Details Name: Harold [name] MRN: [id number] DOB: 04/17/1965 Today's Date: 05/19/2022   History of Present Illness Pt is a 57 y.o. male who presented 05/18/22 for L3-5 revision of spinal fusion redo foraminotomy. PMH: CHF, HTN, neuropathy, OSA on CPAP, T2DM, hx of lumbar fusion, hx of ACDF, gout   Clinical Impression   PTA, pt lives with significant other, typically Modified Independent with ADLs, IADLs and mobility with intermittent cane use due to back pain. Pt presents now fairly close to this reported baseline, able to complete LB ADLs with Modified Independence and mobilize to/from bathroom w/ IV pole as a support. No LOB or safety concerns noted. Pt reports significant other can assist as needed at DC and verbalizes understanding of back precautions. No further skilled OT services needed at this time.       Recommendations for follow up therapy are one component of a multi-disciplinary discharge planning process, led by the attending physician.  Recommendations may be updated based on patient status, additional functional criteria and insurance authorization.   Follow Up Recommendations  No OT follow up     Assistance Recommended at Discharge PRN  Patient can return home with the following A little help with bathing/dressing/bathroom;Assist for transportation    Functional Status Assessment  Patient has not had a recent decline in their functional status  Equipment Recommendations  None recommended by OT    Recommendations for Other Services       Precautions / Restrictions Precautions Precautions: Fall;Back Precaution Booklet Issued: Yes (comment) Precaution Comments: per orders, no brace needed though an LSO is noted in pt room; hemovac Restrictions Weight Bearing Restrictions: No      Mobility Bed Mobility Overal bed mobility: Modified Independent                  Transfers Overall transfer level: Modified  independent Equipment used:  (IVpole)                      Balance Overall balance assessment: Needs assistance Sitting-balance support: No upper extremity supported, Feet supported Sitting balance-Leahy Scale: Good     Standing balance support: Single extremity supported, During functional activity Standing balance-Leahy Scale: Fair                             ADL either performed or assessed with clinical judgement   ADL Overall ADL's : Modified independent                                       General ADL Comments: able to don underwear EOB, mobilize to/from bathroom with IV pole, stand for urination and then hygiene standing at sink. No LOB. Educated re: back precautions for ADLs, discussed brace mgmt (likley more for longer distances, when up in chair, or comfort etc) as current order set says "no brace needed"     Vision Ability to See in Adequate Light: 0 Adequate Patient Visual Report: No change from baseline Vision Assessment?: No apparent visual deficits     Perception     Praxis      Pertinent Vitals/Pain Pain Assessment Pain Assessment: Faces Faces Pain Scale: Hurts little more Pain Location: back Pain Descriptors / Indicators: Grimacing, Sore Pain Intervention(s): Monitored during session, Patient requesting pain meds-RN notified, RN gave pain meds during session  Hand Dominance Right   Extremity/Trunk Assessment Upper Extremity Assessment Upper Extremity Assessment: Overall WFL for tasks assessed   Lower Extremity Assessment Lower Extremity Assessment: Defer to PT evaluation   Cervical / Trunk Assessment Cervical / Trunk Assessment: Back Surgery   Communication Communication Communication: No difficulties   Cognition Arousal/Alertness: Awake/alert Behavior During Therapy: WFL for tasks assessed/performed Overall Cognitive Status: Within Functional Limits for tasks assessed                                        General Comments       Exercises     Shoulder Instructions      Home Living Family/patient expects to be discharged to:: Private residence Living Arrangements: Spouse/significant other Available Help at Discharge: Family;Available 24 hours/day Type of Home: House Home Access: Stairs to enter [employer] of Steps: 3 Entrance Stairs-Rails: Right;Left;Can reach both Home Layout: Two level;Able to live on main level with bedroom/bathroom Alternate Level Stairs-Number of Steps: patient reports able to live on main level   Bathroom Shower/Tub: Teacher, early years/pre: Standard     Home Equipment: Conservation officer, nature (2 wheels);Rollator (4 wheels);Shower seat;Cane - single point          Prior Functioning/Environment Prior Level of Function : Independent/Modified Independent             Mobility Comments: cane for mobility recently due to pain ADLs Comments: Typically very independent        OT Problem List: Pain      OT Treatment/Interventions:      OT Goals(Current goals can be found in the care plan section) Acute Rehab OT Goals Patient Stated Goal: pain control OT Goal Formulation: All assessment and education complete, DC therapy  OT Frequency:      Co-evaluation              AM-PAC OT "6 Clicks" Daily Activity     Outcome Measure Help from another person eating meals?: None Help from another person taking care of personal grooming?: None Help from another person toileting, which includes using toliet, bedpan, or urinal?: None Help from another person bathing (including washing, rinsing, drying)?: None Help from another person to put on and taking off regular upper body clothing?: None Help from another person to put on and taking off regular lower body clothing?: None 6 Click Score: 24   End of Session Nurse Communication: Mobility status  Activity Tolerance: Patient tolerated treatment well Patient left: in  bed;with call bell/phone within reach;with nursing/sitter in room;with bed alarm set  OT Visit Diagnosis: Pain Pain - part of body:  (back)                Time: 1127-1203 OT Time Calculation (min): 36 min Charges:  OT General Charges $OT Visit: 1 Visit OT Evaluation $OT Eval Low Complexity: 1 Low OT Treatments $Self Care/Home Management : 8-22 mins  Malachy Chamber, OTR/L Acute Rehab Services Office: [phone]   Layla Maw 05/19/2022, 12:49 PM

## 2022-05-19 NOTE — Progress Notes (Signed)
  Transition of Care [facility]) Screening Note   Patient Details  Name: Harold [name] Date of Birth: 1965-08-14   Transition of Care [facility]) CM/SW Contact:    Pollie Friar, RN Phone Number: 05/19/2022, 4:03 PM   Pt is s/p: redo decompressive laminotomy and foraminotomy of the left L5 nerve root with repositioning of left interbody cage partial redo facetectomy. No f/u per PT/OT. Pt is from home with spouse. Transition of Care Department [facility]) has reviewed patient. We will continue to monitor patient advancement through interdisciplinary progression rounds. If new patient transition needs arise, please place a TOC consult.

## 2022-05-19 NOTE — Progress Notes (Signed)
Informed pt the hospital doesn't have Monjaro Insulin and asked if he could have his wife bring it in tomorrow. Pt stated "yes."

## 2022-05-19 NOTE — Progress Notes (Deleted)
Talked with granddaughter at pt's rm door and in rm. I explained she is confused and having problems finding and saying the correct words but the day shift nurse told me she was this way all day and it had started last night. The doctor is aware and they did some tests and she has a UTI and was started on Antibiotics by mouth today. Granddaughter asked if she had any pain meds or any other meds tonight. I said the only pain med is MSIR which she takes at home and all her other meds that she takes at home. I explained sometimes with older people a simple UTI can cause bad confusion and even falls.  CPaP placed back on pt per granddaughter's request.

## 2022-05-19 NOTE — Progress Notes (Signed)
Subjective: Patient reports back pain but improved leg pain  Objective: Vital signs in last 24 hours: Temp:  [97.6 F (36.4 C)-98.9 F (37.2 C)] 98.5 F (36.9 C) (02/22 0745) Pulse Rate:  [56-82] 58 (02/22 0745) Resp:  [14-28] 20 (02/22 0745) BP: (101-158)/(68-95) 153/75 (02/22 0745) SpO2:  [95 %-100 %] 99 % (02/22 0745) FiO2 (%):  [30 %] 30 % (02/21 1615) Weight:  [117.9 kg] 117.9 kg (02/21 1001)  Intake/Output from previous day: 02/21 0701 - 02/22 0700 In: 3050 [I.V.:3000; IV Piggyback:50] Out: 2275 [id number]; Drains:140; Blood:200] Intake/Output this shift: No intake/output data recorded.  Strength 5 out of 5 wound clean dry and intact  Lab Results: No results for input(s): "WBC", "HGB", "HCT", "PLT" in the last 72 hours. BMET No results for input(s): "NA", "K", "CL", "CO2", "GLUCOSE", "BUN", "CREATININE", "CALCIUM" in the last 72 hours.  Studies/Results: DG Lumbar Spine 2-3 Views  Result Date: 05/18/2022 CLINICAL DATA:  Lumbar spine surgery EXAM: LUMBAR SPINE - 2-3 VIEW COMPARISON:  07/27/2021 FINDINGS: Two fluoroscopic images are obtained during the performance of the procedure and are provided for interpretation only. Disc spaces are identified at the L3-4 and L4-5 levels. Intrapedicular screws are seen bilaterally at the L4 and L5 levels, not appreciably changed. Posterior fusion bar and the intra corporal screws at the L3 level have been removed. Please refer to operative report. Fluoroscopy time: 10 seconds, 8.83 mGy IMPRESSION: 1. Intraoperative exam as above. Electronically Signed   By: Randa Ngo M.D.   On: 05/18/2022 15:01   DG C-Arm 1-60 Min-No Report  Result Date: 05/18/2022 Fluoroscopy was utilized by the requesting physician.  No radiographic interpretation.    Assessment/Plan: Postop day 1 revision of spinal fusion redo foraminotomy doing well continue to mobilize with physical and Occupational Therapy will adjust some pain medication  LOS: 1 day      Elaina Hoops 05/19/2022, 7:58 AM

## 2022-05-19 NOTE — Evaluation (Signed)
Physical Therapy Evaluation & Discharge Patient Details Name: Harold [name] MRN: [id number] DOB: [birth year] Today's Date: 05/19/2022  History of Present Illness  Pt is a 57 y.o. male who presented 05/18/22 for L3-5 revision of spinal fusion redo foraminotomy. PMH: CHF, HTN, neuropathy, OSA on CPAP, T2DM, hx of lumbar fusion, hx of ACDF, gout   Clinical Impression  Pt presents with condition above and deficits mentioned below, see PT Problem List. PTA, he was mod I, using a cane recently for pain management. He lives with his significant other in a 2-level house with 3 STE. Currently, pt is demonstrating intact, WFL, and symmetrical bil lower extremity strength, sensation, and coordination. He is limited mainly by back pain, moving at a decreased speed but safely without LOB or need for assistance or UE support. He demonstrates good understanding/compliance of his spinal precautions with all functional mobility. Pt will likely progress well as his pain improves. All education completed and questions answered. PT will sign off.      Recommendations for follow up therapy are one component of a multi-disciplinary discharge planning process, led by the attending physician.  Recommendations may be updated based on patient status, additional functional criteria and insurance authorization.  Follow Up Recommendations No PT follow up      Assistance Recommended at Discharge PRN  Patient can return home with the following  A little help with bathing/dressing/bathroom;Assistance with cooking/housework;Assist for transportation;Help with stairs or ramp for entrance    Equipment Recommendations None recommended by PT  Recommendations for Other Services       Functional Status Assessment Patient has not had a recent decline in their functional status     Precautions / Restrictions Precautions Precautions: Fall;Back Precaution Booklet Issued: Yes (comment) Precaution Comments: per orders, no brace  needed though an LSO is noted in pt room; hemovac Restrictions Weight Bearing Restrictions: No      Mobility  Bed Mobility Overal bed mobility: Modified Independent             General bed mobility comments: Demonstrates good compliance to his spinal precautions with bed mobility, no assistance needed but needs extra time due to pain    Transfers Overall transfer level: Modified independent Equipment used:  (IVpole)               General transfer comment: No assistance needed, no LOB, pt using IV pole for security initially upon standing    Ambulation/Gait Ambulation/Gait assistance: Supervision Gait Distance (Feet): 230 Feet Assistive device: IV Pole, None Gait Pattern/deviations: Step-through pattern, Decreased stride length Gait velocity: reduced Gait velocity interpretation: >2.62 ft/sec, indicative of community ambulatory   General Gait Details: Pt using IV pole initially for confidence, but able to quickly progress away from it to no UE support. No overt LOB, supervision for safety  Stairs Stairs:  (pt declined need to practice stairs)          Wheelchair Mobility    Modified Rankin (Stroke Patients Only)       Balance Overall balance assessment: Needs assistance Sitting-balance support: No upper extremity supported, Feet supported Sitting balance-Leahy Scale: Good     Standing balance support: Single extremity supported, During functional activity, No upper extremity supported Standing balance-Leahy Scale: Good Standing balance comment: Able to ambulate without UE support without LOB                             Pertinent Vitals/Pain Pain Assessment Pain Assessment:  Faces Faces Pain Scale: Hurts little more Pain Location: back Pain Descriptors / Indicators: Grimacing, Sore Pain Intervention(s): Limited activity within patient's tolerance, Monitored during session, Repositioned, Premedicated before session    [place] expects to be discharged to:: Private residence Living Arrangements: Spouse/significant other Available Help at Discharge: Family;Available 24 hours/day Type of Home: House Home Access: Stairs to enter Entrance Stairs-Rails: Right;Left;Can reach both Entrance Stairs-Number of Steps: 3 Alternate Level Stairs-Number of Steps: patient reports able to live on main level Home Layout: Two level;Able to live on main level with bedroom/bathroom Home Equipment: Rolling Walker (2 wheels);Rollator (4 wheels);Shower seat;Cane - single point      Prior Function Prior Level of Function : Independent/Modified Independent             Mobility Comments: cane for mobility recently due to pain ADLs Comments: Typically very independent     Hand Dominance   Dominant Hand: Right    Extremity/Trunk Assessment   Upper Extremity Assessment Upper Extremity Assessment: Defer to OT evaluation    Lower Extremity Assessment Lower Extremity Assessment: Overall WFL for tasks assessed (denies numbness/tingling bil currently but hx of numbness in toes intermittently; grossly 4+ to 5 MMT bil; coordination grossly intact bil)    Cervical / Trunk Assessment Cervical / Trunk Assessment: Back Surgery  Communication   Communication: No difficulties  Cognition Arousal/Alertness: Awake/alert Behavior During Therapy: WFL for tasks assessed/performed Overall Cognitive Status: Within Functional Limits for tasks assessed                                          General Comments General comments (skin integrity, edema, etc.): found vape in pt's bed, educated pt smoking is not permitted in the hospital and put pt's vape away in his bag with pt verbalizing understanding - notified RN    Exercises     Assessment/Plan    PT Assessment Patient does not need any further PT services  PT Problem List         PT Treatment Interventions      PT Goals (Current goals can be found  in the Care Plan section)  Acute Rehab PT Goals Patient Stated Goal: to get better PT Goal Formulation: All assessment and education complete, DC therapy Time For Goal Achievement: 05/20/22 Potential to Achieve Goals: Good    Frequency       Co-evaluation               AM-PAC PT "6 Clicks" Mobility  Outcome Measure Help needed turning from your back to your side while in a flat bed without using bedrails?: None Help needed moving from lying on your back to sitting on the side of a flat bed without using bedrails?: None Help needed moving to and from a bed to a chair (including a wheelchair)?: None Help needed standing up from a chair using your arms (e.g., wheelchair or bedside chair)?: None Help needed to walk in hospital room?: A Little Help needed climbing 3-5 steps with a railing? : A Little 6 Click Score: 22    End of Session Equipment Utilized During Treatment: Gait belt;Back brace Activity Tolerance: Patient tolerated treatment well Patient left: in bed;with call bell/phone within reach Nurse Communication: Mobility status;Other (comment) (vape found in room) PT Visit Diagnosis: Other abnormalities of gait and mobility (R26.89);Pain Pain - part of body:  (back)  [id number] PT Time Calculation (min) (ACUTE ONLY): 24 min   Charges:   PT Evaluation $PT Eval Low Complexity: 1 Low PT Treatments $Therapeutic Activity: 8-22 mins        Moishe Spice, PT, DPT Acute Rehabilitation Services  Office: [phone]   Orvan Falconer 05/19/2022, 2:06 PM

## 2022-05-19 NOTE — Plan of Care (Signed)
Pt has used CPaP most of the night. HS bld sugar not checked because pt ate dinner around 2000. Neuro checks and neurovasc checks WNL every 4 hours.

## 2022-05-19 NOTE — Progress Notes (Deleted)
Daughter called to check on mom's condition because she hadn't been able to reach her by phone today. I explained I just came from the rm and she was the same as what day shift nurse said she was all day. I took my phone into rm and let pt talk to daughter why I looked for her rm phone and cell phone in the rm and made sure they were close to the pt and she knew where they were.

## 2022-05-19 NOTE — Progress Notes (Deleted)
ED nurse called and stated granddaughter was here to see pt because they were concerned when they talked to her she didn't sound right.

## 2022-05-19 NOTE — Anesthesia Postprocedure Evaluation (Signed)
Anesthesia Post Note  Patient: Harold [name]  Procedure(s) Performed: Lumbar fusion - Lumbar Three-Lumbar Four - Lumbar Four-Lumbar Five revision for pseudoarthrosis with removal of hardware replacement and redo decompression foraminotomy on the left at Lumbar Four-Five (Back)     Patient location during evaluation: PACU Anesthesia Type: General Level of consciousness: awake and alert Pain management: pain level controlled Vital Signs Assessment: post-procedure vital signs reviewed and stable Respiratory status: spontaneous breathing, nonlabored ventilation, respiratory function stable and patient connected to nasal cannula oxygen Cardiovascular status: blood pressure returned to baseline and stable Postop Assessment: no apparent nausea or vomiting Anesthetic complications: no   No notable events documented.  Last Vitals:  Vitals:   05/19/22 1252 05/19/22 1534  BP: 117/80 (!) 159/95  Pulse: 72 70  Resp: 19   Temp: 36.8 C 36.6 C  SpO2: 98% 100%    Last Pain:  Vitals:   05/19/22 1600  TempSrc:   PainSc: [place]

## 2022-05-20 LAB — GLUCOSE, CAPILLARY: Glucose-Capillary: 122 mg/dL — ABNORMAL HIGH (ref 70–99)

## 2022-05-20 MED ORDER — OXYCODONE HCL 15 MG PO TABS: 15.0000 mg | ORAL_TABLET | ORAL | 0 refills | Status: DC | PRN

## 2022-05-20 NOTE — Discharge Summary (Signed)
Physician Discharge Summary  Patient ID: Harold [name] MRN: [id number] DOB/AGE: 07-23-1965 57 y.o. Estimated body mass index is 36.26 kg/m as calculated from the following:   Height as of this encounter: '5\' 11"'$  (1.803 m).   Weight as of this encounter: 117.9 kg.   Admit date: 05/18/2022 Discharge date: 05/20/2022  Admission Diagnoses: Pseudoarthrosis L4-5  Discharge Diagnoses: Same Principal Problem:   Pseudoarthrosis of lumbar spine   Discharged Condition: good  Hospital Course: Patient admitted to hospital underwent redo posterior lateral instrumented fusion at L4-5 removal of hardware and replacement.  Also redo foraminotomies of the L5 nerve root on the left patient did well covering the floor on the floor was ambulating and voiding spontaneously tolerating regular diet and stable for discharge home.  Patient will be discharged scheduled follow-up in 1 to 2 weeks.  Consults: Significant Diagnostic Studies: Treatments: Redo decompression fusion L4-5 Discharge Exam: Blood pressure 114/65, pulse (!) 56, temperature 97.9 F (36.6 C), temperature source Oral, resp. rate 20, height '5\' 11"'$  (1.803 m), weight 117.9 kg, SpO2 99 %. Strength 5 out of 5 wound clean dry and intact  Disposition: Home   Allergies as of 05/20/2022       Reactions   Diphenhydramine Palpitations, Other (See Comments)   "jittery" Pt states he can take Benadryl in an emergency situation   Beta Adrenergic Blockers Other (See Comments)   Erectile dysfxn   Colchicine Diarrhea   Lipitor [atorvastatin] Other (See Comments)   Muscle cramps   Pecan Nut (diagnostic) Palpitations   Pecans: itching and swelling of the tongue    Septra [sulfamethoxazole-trimethoprim] Itching        Medication List     STOP taking these medications    diclofenac Sodium 1 % Gel Commonly known as: VOLTAREN   ibuprofen 800 MG tablet Commonly known as: ADVIL   oxyCODONE-acetaminophen 10-325 MG tablet Commonly known  as: PERCOCET       TAKE these medications    amLODipine 10 MG tablet Commonly known as: NORVASC Take 1 tablet (10 mg total) by mouth daily.   ascorbic acid 500 MG tablet Commonly known as: VITAMIN C Take 500 mg by mouth daily.   aspirin EC 81 MG tablet Take 81 mg by mouth daily.   aspirin-sod bicarb-citric acid 325 MG Tbef tablet Commonly known as: ALKA-SELTZER Take 650 mg by mouth every 6 (six) hours as needed (indigestion).   citalopram 40 MG tablet Commonly known as: CELEXA Take 1 tablet (40 mg total) by mouth daily.   cyanocobalamin 1000 MCG tablet Commonly known as: VITAMIN B12 Take 1 tablet (1,000 mcg total) by mouth daily.   cyclobenzaprine 10 MG tablet Commonly known as: FLEXERIL Take 1 tablet (10 mg total) by mouth 3 (three) times daily as needed for muscle spasms.   Dexcom G7 Receiver Devi Use as directed once per day E11.9   Dexcom G7 Sensor Misc Use as directed once every 2 weeks E11.9   fenofibrate 145 MG tablet Commonly known as: TRICOR Take 1 tablet (145 mg total) by mouth daily. TAKE 1 TABLET(145 MG) BY MOUTH DAILY   fluticasone 50 MCG/ACT nasal spray Commonly known as: FLONASE Place 2 sprays into both nostrils daily. What changed:  when to take this reasons to take this   gabapentin 300 MG capsule Commonly known as: NEURONTIN Take 900 mg by mouth 3 (three) times daily.   glucose blood test strip Use as instructed once daily E119   hydrochlorothiazide 12.5 MG capsule Commonly known as:  Microzide Take 1 capsule (12.5 mg total) by mouth daily.   Lancets Misc Use as directed daily E11.9   lisinopril 40 MG tablet Commonly known as: ZESTRIL TAKE 1 TABLET(40 MG) BY MOUTH DAILY   loratadine 10 MG tablet Commonly known as: CLARITIN TAKE 1 TABLET(10 MG) BY MOUTH DAILY AS NEEDED FOR ALLERGIES   Magnesium 500 MG Tabs Take 500 mg by mouth daily.   metFORMIN 500 MG 24 hr tablet Commonly known as: GLUCOPHAGE-XR TAKE 2 TABLETS BY MOUTH  DAILY WITH BREAKFAST   ONE TOUCH ULTRA 2 w/Device Kit Use as directed daily E11.9   oxyCODONE 15 MG immediate release tablet Commonly known as: ROXICODONE Take 1 tablet (15 mg total) by mouth every 4 (four) hours as needed for severe pain.   Potassium Chloride ER 20 MEQ Tbcr Take 1 tablet by mouth daily.   rosuvastatin 20 MG tablet Commonly known as: CRESTOR Take 1 tablet (20 mg total) by mouth daily. TAKE 1 TABLET(20 MG) BY MOUTH DAILY   tadalafil 20 MG tablet Commonly known as: CIALIS Take 0.5-1 tablets (10-20 mg total) by mouth every other day as needed for erectile dysfunction.   tamsulosin 0.4 MG Caps capsule Commonly known as: FLOMAX Take 1 capsule (0.4 mg total) by mouth daily.   tirzepatide 2.5 MG/0.5ML Pen Commonly known as: MOUNJARO Inject 2.5 mg into the skin once a week.   tiZANidine 4 MG tablet Commonly known as: ZANAFLEX Take 4 mg by mouth 3 (three) times daily as needed.   tolterodine 4 MG 24 hr capsule Commonly known as: DETROL LA Take 1 capsule (4 mg total) by mouth daily.   TYLENOL SINUS CONGESTION/PAIN PO Take 2 tablets by mouth 3 (three) times daily as needed (congestion).   VICKS VAPOINHALER IN Inhale 1 puff into the lungs daily as needed (congestion).   VITAMIN D3 PO Take 1 capsule by mouth daily.   zinc gluconate 50 MG tablet Take 50 mg by mouth daily.         Signed: Elaina Hoops 05/20/2022, 9:30 AM

## 2022-05-20 NOTE — Progress Notes (Signed)
Discharge instructions discussed with patient. All questions answered. Iv removed, telemetry discontinued. Patient belongings sent with patient. Patient to be wheeled off unit for transport home.  Gwendolyn Grant, RN

## 2022-05-20 NOTE — TOC Transition Note (Signed)
Transition of Care [facility]) - CM/SW Discharge Note   Patient Details  Name: Harold [name] MRN: [id number] Date of Birth: 27-Jul-1965  Transition of Care [facility]) CM/SW Contact:  Pollie Friar, RN Phone Number: 05/20/2022, 9:40 AM   Clinical Narrative:    Pt is discharging home with self care. No f/u per PT/OT. Pt has transportation home.   Final next level of care: Home/Self Care Barriers to Discharge: No Barriers Identified   Patient Goals and CMS Choice      Discharge Placement                         Discharge Plan and Services Additional resources added to the After Visit Summary for                                       Social Determinants of Health (SDOH) Interventions SDOH Screenings   Food Insecurity: No Food Insecurity (08/07/2020)  Housing: Low Risk  (08/07/2020)  Transportation Needs: No Transportation Needs (08/07/2020)  Depression (PHQ2-9): Low Risk  (04/05/2022)  Financial Resource Strain: Medium Risk (08/07/2020)  Tobacco Use: High Risk (05/18/2022)     Readmission Risk Interventions     No data to display

## 2022-05-23 ENCOUNTER — Encounter: Payer: Self-pay | Admitting: *Deleted

## 2022-05-23 ENCOUNTER — Telehealth: Payer: Self-pay | Admitting: *Deleted

## 2022-05-23 NOTE — Transitions of Care (Post Inpatient/ED Visit) (Signed)
   05/23/2022  Name: Harold [name] MRN: [id number] DOB: 01-24-1966  Today's TOC FU Call Status: Today's TOC FU Call Status:: Successful TOC FU Call Competed (HIPAA identifiers verified x 2) TOC FU Call Complete Date: 05/23/22  Transition Care Management Follow-up Telephone Call Date of Discharge: 05/20/22 Discharge Facility: [facility]) Type of Discharge: Inpatient Admission Primary Inpatient Discharge Diagnosis:: Surgical Lumbar 4-5 fusion How have you been since you were released from the hospital?: Same Any questions or concerns?: Yes Patient Questions/Concerns:: "I am just still in a degree of pain; I called the surgeon's office today and they told me to call back if I start to run out of the pain medication they prescribed; I have their number and I will call them back like they told me to if I run into any problems; other than that- everything is going okay" Patient Questions/Concerns Addressed: Other: (confirmed patient has contact information for all care providers)  Items Reviewed: Did you receive and understand the discharge instructions provided?: Yes Medications obtained and verified?: Yes (Medications Reviewed) (patient declined full medication review; denies questions concerns around medications; self-manages medications; confirmed patient obtained/ is taking newly Rx'd medications post-surgery) Any new allergies since your discharge?: No Dietary orders reviewed?: Yes Type of Diet Ordered:: Regular Do you have support at home?: Yes People in Home: significant other Name of Support/Comfort Primary Source: [name]- girlfriend; reports he is essentially independent in self-care  Home Care and Equipment/Supplies: [place] Ordered?: No Any new equipment or medical supplies ordered?: No  Functional Questionnaire: Do you need assistance with bathing/showering or dressing?: No Do you need assistance with meal preparation?: No Do you need assistance with  eating?: No Do you have difficulty maintaining continence: No Do you need assistance with getting out of bed/getting out of a chair/moving?: No Do you have difficulty managing or taking your medications?: No  Folllow up appointments reviewed: PCP Follow-up appointment confirmed?: No (verified no PCP hospital follow up recommended-- surgical specialist only recommended) MD Provider Line Number:[phone] Given: No Specialist Hospital Follow-up appointment confirmed?: Yes Date of Specialist follow-up appointment?: 05/31/22 Follow-Up Specialty Provider:: neurosurgeon, Dr. Saintclair Halsted Do you need transportation to your follow-up appointment?: No Do you understand care options if your condition(s) worsen?: Yes-patient verbalized understanding  SDOH Interventions Today    Flowsheet Row Most Recent Value  SDOH Interventions   Food Insecurity Interventions Intervention Not Indicated  Transportation Interventions Intervention Not Indicated  [significant other provides transportation post-recent surgery/ hospitalization]      TOC Interventions Today    Flowsheet Row Most Recent Value  TOC Interventions   TOC Interventions Discussed/Reviewed TOC Interventions Discussed      Interventions Today    Flowsheet Row Most Recent Value  Chronic Disease   Chronic disease during today's visit Other  [surgery- lumbar fusion]  General Interventions   General Interventions Discussed/Reviewed General Interventions Discussed, Doctor Visits  Doctor Visits Discussed/Reviewed Specialist  PCP/Specialist Visits Compliance with follow-up visit  Education Interventions   Education Provided Provided Education  Provided Verbal Education On Medication  [process to manage any potential concerns around pain management post-recent surgery]  Nutrition Interventions   Nutrition Discussed/Reviewed Nutrition Discussed  Pharmacy Interventions   Pharmacy Dicussed/Reviewed Pharmacy Topics Discussed      Oneta Rack, RN, BSN, CCRN Alumnus RN CM Care Coordination/ Transition of [place] Management [phone]: direct office

## 2022-06-03 MED FILL — Sodium Chloride IV Soln 0.9%: INTRAVENOUS | Qty: 1000 | Status: AC

## 2022-06-03 MED FILL — Heparin Sodium (Porcine) Inj 1000 Unit/ML: INTRAMUSCULAR | Qty: 30 | Status: AC

## 2022-06-23 DIAGNOSIS — S32009K Unspecified fracture of unspecified lumbar vertebra, subsequent encounter for fracture with nonunion: Secondary | ICD-10-CM | POA: Diagnosis not present

## 2022-06-28 ENCOUNTER — Other Ambulatory Visit: Payer: Self-pay | Admitting: Neurosurgery

## 2022-06-28 DIAGNOSIS — S32009K Unspecified fracture of unspecified lumbar vertebra, subsequent encounter for fracture with nonunion: Secondary | ICD-10-CM

## 2022-06-28 IMAGING — CT CT L SPINE W/O CM
1 of 7 series · 6 of 14 positions shown, 8 images · non-contrast
Comparison: CT lumbar spine 05/21/2021, MR lumbar spine 04/07/2021

CLINICAL DATA: History of lumbar spine fusion with subsequent
revision at L3 through L5.



[Series 3: l spine soft · axial · 0.31mm/px · z∈[-250,-90]mm · 6 of 113 slices shown, 8 images]
[im 17/113  soft-tissue]
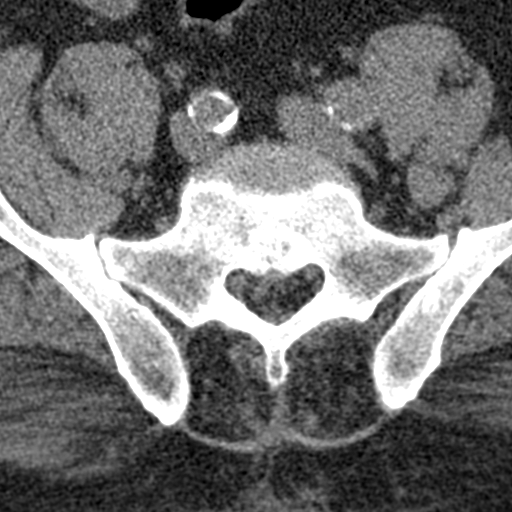
[im 17/113  bone]
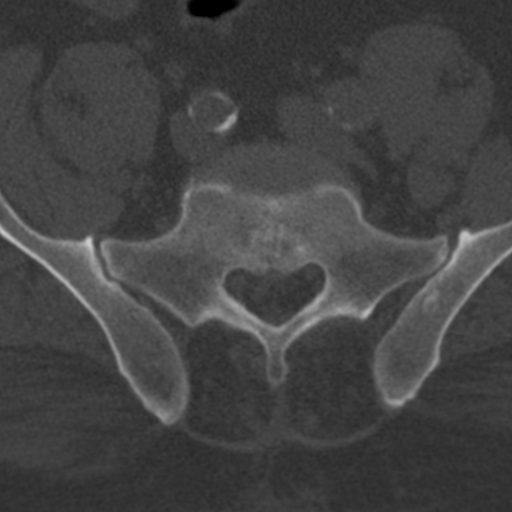
[im 33/113  bone]
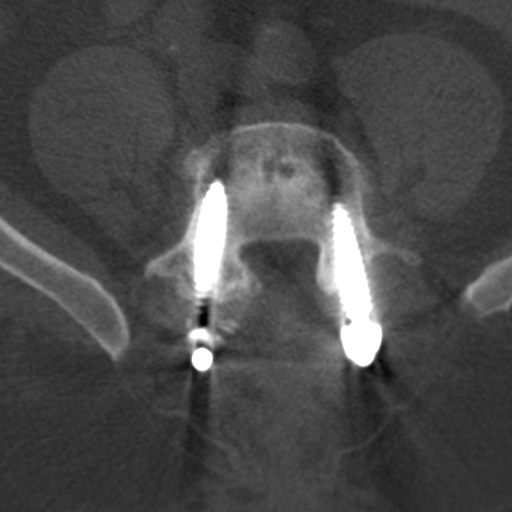
[im 49/113  bone]
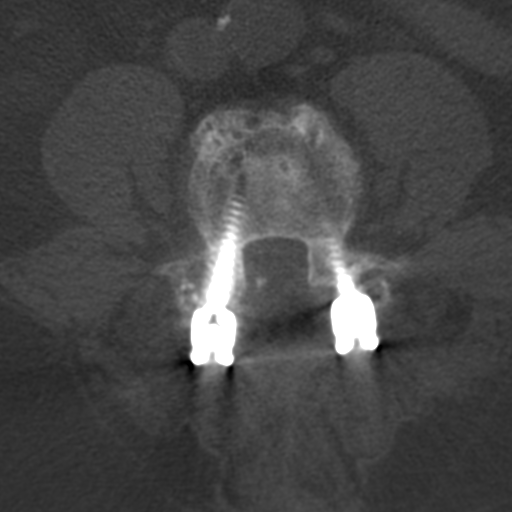
[im 65/113  bone]
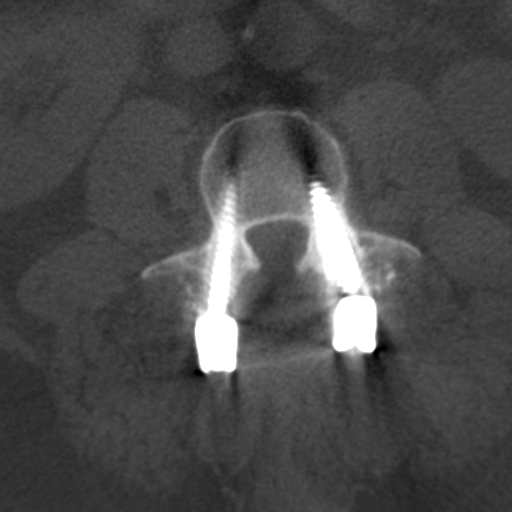
[im 81/113  soft-tissue]
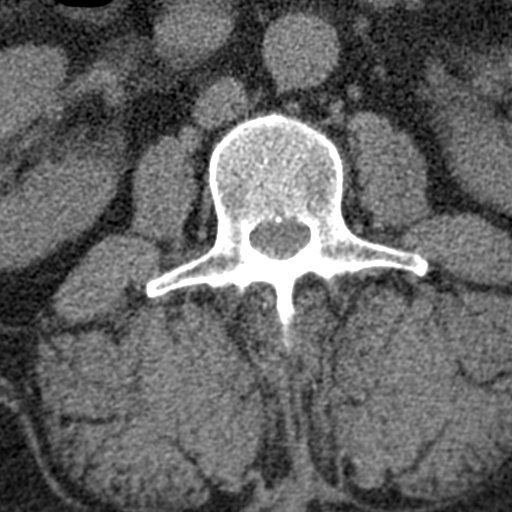
[im 81/113  bone]
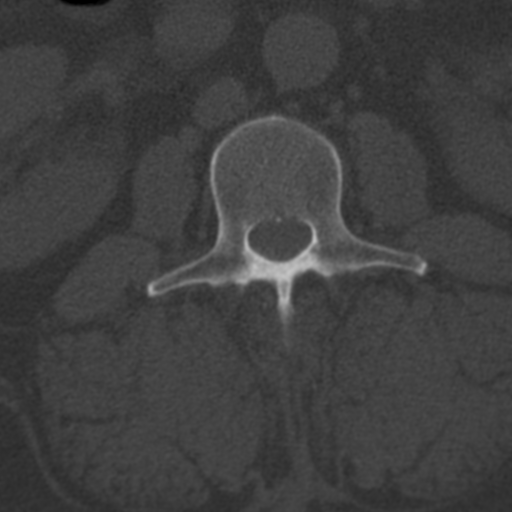
[im 97/113  bone]
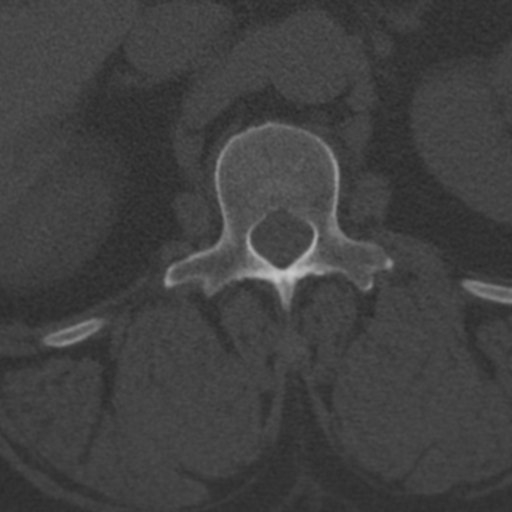

[6 of 14 positions shown; findings below may reference images not displayed]

FINDINGS: Segmentation: Standard; the lowest formed disc space is designated
L5-S1.

Alignment: There is trace grade 1 anterolisthesis of L3 on L4 not
significantly changed. Alignment is otherwise normal.

Vertebrae: Postsurgical changes reflecting posterior instrumented
fusion and decompression from L3 through L5 are again seen, with
interval revision since the study of 05/21/2021. Suspected fusion is
again seen at the L3-L4 level. There is no evidence of osseous
fusion at L4-L5. Cystic changes are again seen in the endplates
adjacent to the L4-L5 disc space, unchanged.

Previously seen lucency around the left L3 screw and bilateral L5
screws is decreased in conspicuity, aside from lucency adjacent to
the tip of the left L5 screw reflecting the track from the prior
screw.

Vertebral body heights are preserved, without evidence of acute
injury. There is no suspicious osseous lesion.

Paraspinal and other soft tissues: Postsurgical changes in the soft
tissues posterior to the surgical levels are again noted. There is
calcified atherosclerotic plaque in the abdominal aorta. A
nonobstructing left lower pole renal stone is again seen.

Disc levels:

T12-L1: No significant spinal canal or neural foraminal stenosis

L1-L2: No significant spinal canal or neural foraminal stenosis

L2-L3: No significant spinal canal or neural foraminal stenosis

L3-L4: Status post posterior instrumented fusion and decompression.
There is grade 1 anterolisthesis, unchanged. The interbody spacer is
again posteriorly positioned within the disc space. There is no
evidence of significant neural foraminal stenosis. Spinal canal
stenosis was better seen on the prior MRI.

L4-L5: Status post posterior instrumented fusion and decompression.
Bone graft material is again seen in the bilateral subarticular
zone/foramina, left worse than right, resulting in bilateral
foraminal stenosis with possible irritation of either traversing or
exiting nerve root. Spinal canal stenosis was better seen on the
prior MRI.

L5-S1: Bilateral facet arthropathy without high-grade spinal canal
or neural foraminal stenosis.
IMPRESSION: 1. Postsurgical changes reflecting posterior instrumented fusion and
decompression with subsequent revision at L3-L4 and L4-L5.
Previously seen lucency around the left L3 and bilateral L5 screws
is decreased in conspicuity following revision.
2. Unchanged bone graft material in the left subarticular zones and
foramina at L4-L5.
3. Similar cystic change along the L4-L5 endplates without evidence
of osseous fusion.

## 2022-07-06 ENCOUNTER — Other Ambulatory Visit: Payer: Self-pay

## 2022-07-06 MED ORDER — LORATADINE 10 MG PO TABS: ORAL_TABLET | ORAL | 3 refills | Status: DC

## 2022-07-11 ENCOUNTER — Emergency Department (HOSPITAL_BASED_OUTPATIENT_CLINIC_OR_DEPARTMENT_OTHER): Payer: Medicare HMO

## 2022-07-11 ENCOUNTER — Other Ambulatory Visit: Payer: Self-pay

## 2022-07-11 ENCOUNTER — Encounter (HOSPITAL_BASED_OUTPATIENT_CLINIC_OR_DEPARTMENT_OTHER): Payer: Self-pay | Admitting: Emergency Medicine

## 2022-07-11 DIAGNOSIS — M545 Low back pain, unspecified: Secondary | ICD-10-CM | POA: Insufficient documentation

## 2022-07-11 DIAGNOSIS — F17200 Nicotine dependence, unspecified, uncomplicated: Secondary | ICD-10-CM | POA: Insufficient documentation

## 2022-07-11 DIAGNOSIS — Z79899 Other long term (current) drug therapy: Secondary | ICD-10-CM | POA: Insufficient documentation

## 2022-07-11 DIAGNOSIS — M25561 Pain in right knee: Secondary | ICD-10-CM | POA: Insufficient documentation

## 2022-07-11 DIAGNOSIS — G8929 Other chronic pain: Secondary | ICD-10-CM | POA: Diagnosis not present

## 2022-07-11 DIAGNOSIS — M5459 Other low back pain: Secondary | ICD-10-CM | POA: Diagnosis not present

## 2022-07-11 DIAGNOSIS — I10 Essential (primary) hypertension: Secondary | ICD-10-CM | POA: Insufficient documentation

## 2022-07-11 DIAGNOSIS — M549 Dorsalgia, unspecified: Secondary | ICD-10-CM | POA: Insufficient documentation

## 2022-07-11 DIAGNOSIS — Z7982 Long term (current) use of aspirin: Secondary | ICD-10-CM | POA: Insufficient documentation

## 2022-07-11 DIAGNOSIS — M25461 Effusion, right knee: Secondary | ICD-10-CM | POA: Diagnosis not present

## 2022-07-11 DIAGNOSIS — M1711 Unilateral primary osteoarthritis, right knee: Secondary | ICD-10-CM | POA: Diagnosis not present

## 2022-07-11 DIAGNOSIS — Z5321 Procedure and treatment not carried out due to patient leaving prior to being seen by health care provider: Secondary | ICD-10-CM | POA: Diagnosis not present

## 2022-07-11 NOTE — ED Triage Notes (Signed)
Pt arrives via POV following ongoing back pain that has been ongoing for several week, approx 1 month. Also c/o worsening pain in his right knee and swelling. No injury/trauma. Endorses hx several back surgeries. Pt ambulatory in triage with cane.

## 2022-07-12 ENCOUNTER — Emergency Department (HOSPITAL_BASED_OUTPATIENT_CLINIC_OR_DEPARTMENT_OTHER)
Admission: EM | Admit: 2022-07-12 | Discharge: 2022-07-12 | Disposition: A | Discharge status: Home / Self Care | Payer: Medicare HMO | Source: Home / Self Care | Attending: Emergency Medicine | Admitting: Emergency Medicine

## 2022-07-12 ENCOUNTER — Emergency Department (HOSPITAL_BASED_OUTPATIENT_CLINIC_OR_DEPARTMENT_OTHER): Payer: Medicare HMO

## 2022-07-12 ENCOUNTER — Encounter (HOSPITAL_BASED_OUTPATIENT_CLINIC_OR_DEPARTMENT_OTHER): Payer: Self-pay

## 2022-07-12 ENCOUNTER — Emergency Department (HOSPITAL_BASED_OUTPATIENT_CLINIC_OR_DEPARTMENT_OTHER)
Admission: EM | Admit: 2022-07-12 | Discharge: 2022-07-12 | Discharge status: Against Medical Advice | Payer: Medicare HMO | Attending: Emergency Medicine | Admitting: Emergency Medicine

## 2022-07-12 DIAGNOSIS — M545 Low back pain, unspecified: Secondary | ICD-10-CM | POA: Insufficient documentation

## 2022-07-12 DIAGNOSIS — Z7982 Long term (current) use of aspirin: Secondary | ICD-10-CM | POA: Insufficient documentation

## 2022-07-12 DIAGNOSIS — G8929 Other chronic pain: Secondary | ICD-10-CM | POA: Diagnosis not present

## 2022-07-12 DIAGNOSIS — I10 Essential (primary) hypertension: Secondary | ICD-10-CM | POA: Insufficient documentation

## 2022-07-12 DIAGNOSIS — Z79899 Other long term (current) drug therapy: Secondary | ICD-10-CM | POA: Insufficient documentation

## 2022-07-12 DIAGNOSIS — M25561 Pain in right knee: Secondary | ICD-10-CM

## 2022-07-12 DIAGNOSIS — M5459 Other low back pain: Secondary | ICD-10-CM | POA: Diagnosis not present

## 2022-07-12 DIAGNOSIS — F17200 Nicotine dependence, unspecified, uncomplicated: Secondary | ICD-10-CM | POA: Insufficient documentation

## 2022-07-12 MED ORDER — HYDROMORPHONE HCL 1 MG/ML IJ SOLN
1.0000 mg | Freq: Once | INTRAMUSCULAR | Status: AC
  Administered 2022-07-12: 1 mg via INTRAMUSCULAR
  Filled 2022-07-12: qty 1

## 2022-07-12 MED ORDER — KETOROLAC TROMETHAMINE 60 MG/2ML IM SOLN
60.0000 mg | Freq: Once | INTRAMUSCULAR | Status: AC
  Administered 2022-07-12: 60 mg via INTRAMUSCULAR
  Filled 2022-07-12: qty 2

## 2022-07-12 NOTE — ED Provider Notes (Signed)
[name] Provider Note   CSN: [id number] Arrival date & time: 07/12/22  0059     History  Chief Complaint  Patient presents with   Back Pain   Knee Pain    Harold [name] is a 57 y.o. male.  The history is provided by medical records and the patient.  Back Pain Knee Pain Associated symptoms: back pain   Harold [name] is a 57 y.o. male who presents to the Emergency Department complaining of back pain.  He presents to the emergency department for evaluation of back pain that has been ongoing for a long time but worsening recently.  He had surgery performed in February.  The third week in March he had a fall and followed up with Dr. Wynetta Emery with neurosurgery, who ordered a CT scan but this has not been performed yet.  He complains of pain in his midline and left low back.  He also states that his right knee is bothering him but he thinks it is due to favoring his right knee due to pain in the left back.  No cough, nausea, vomiting, diarrhea, dysuria, night sweats.  No incontinence.  Has a history of hypertension, prediabetes, hyperlipidemia.  Smokes tobacco.  Rare alcohol.  No street drugs.    Takes gabapentin, percocet and ibuprofen    Home Medications Prior to Admission medications   Medication Sig Start Date End Date Taking? Authorizing Provider  amLODipine (NORVASC) 10 MG tablet Take 1 tablet (10 mg total) by mouth daily. 04/05/22   Corwin Levins, MD  Aromatic Inhalants (VICKS VAPOINHALER IN) Inhale 1 puff into the lungs daily as needed (congestion).    [provider]  ascorbic acid (VITAMIN C) 500 MG tablet Take 500 mg by mouth daily.    [provider]  aspirin EC 81 MG tablet Take 81 mg by mouth daily.    [provider]  aspirin-sod bicarb-citric acid (ALKA-SELTZER) 325 MG TBEF tablet Take 650 mg by mouth every 6 (six) hours as needed (indigestion).    [provider]  Blood Glucose  Monitoring Suppl (ONE TOUCH ULTRA 2) w/Device KIT Use as directed daily E11.9 08/22/18   Corwin Levins, MD  Cholecalciferol (VITAMIN D3 PO) Take 1 capsule by mouth daily.    [provider]  citalopram (CELEXA) 40 MG tablet Take 1 tablet (40 mg total) by mouth daily. 04/05/22   Corwin Levins, MD  Continuous Blood Gluc Receiver (DEXCOM G7 RECEIVER) DEVI Use as directed once per day E11.9 04/05/22   Corwin Levins, MD  Continuous Blood Gluc Sensor (DEXCOM G7 SENSOR) MISC Use as directed once every 2 weeks E11.9 04/05/22   Corwin Levins, MD  cyclobenzaprine (FLEXERIL) 10 MG tablet Take 1 tablet (10 mg total) by mouth 3 (three) times daily as needed for muscle spasms. 06/12/21   Jadene Pierini, MD  fenofibrate (TRICOR) 145 MG tablet Take 1 tablet (145 mg total) by mouth daily. TAKE 1 TABLET(145 MG) BY MOUTH DAILY 04/05/22   Corwin Levins, MD  fluticasone [facility]) 50 MCG/ACT nasal spray Place 2 sprays into both nostrils daily. Patient taking differently: Place 2 sprays into both nostrils daily as needed for allergies. 07/03/20   Corwin Levins, MD  gabapentin (NEURONTIN) 300 MG capsule Take 900 mg by mouth 3 (three) times daily.    [provider]  glucose blood test strip Use as instructed once daily E119 08/22/18   Jonny Ruiz,  Len Blalock, MD  hydrochlorothiazide (MICROZIDE) 12.5 MG capsule Take 1 capsule (12.5 mg total) by mouth daily. 04/05/22 04/05/23  Corwin Levins, MD  Lancets MISC Use as directed daily E11.9 08/22/18   Corwin Levins, MD  lisinopril (ZESTRIL) 40 MG tablet TAKE 1 TABLET(40 MG) BY MOUTH DAILY 04/05/22   Corwin Levins, MD  loratadine (CLARITIN) 10 MG tablet TAKE 1 TABLET(10 MG) BY MOUTH DAILY AS NEEDED FOR ALLERGIES 07/06/22   Corwin Levins, MD  Magnesium 500 MG TABS Take 500 mg by mouth daily.    [provider]  metFORMIN (GLUCOPHAGE-XR) 500 MG 24 hr tablet TAKE 2 TABLETS BY MOUTH DAILY WITH BREAKFAST Patient not taking: Reported on 04/20/2022 04/05/22   Corwin Levins, MD  oxyCODONE  (ROXICODONE) 15 MG immediate release tablet Take 1 tablet (15 mg total) by mouth every 4 (four) hours as needed for severe pain. 05/20/22   Donalee Citrin, MD  Phenylephrine-Acetaminophen (TYLENOL SINUS CONGESTION/PAIN PO) Take 2 tablets by mouth 3 (three) times daily as needed (congestion).    [provider]  Potassium Chloride ER 20 MEQ TBCR Take 1 tablet by mouth daily. 04/05/22   Corwin Levins, MD  rosuvastatin (CRESTOR) 20 MG tablet Take 1 tablet (20 mg total) by mouth daily. TAKE 1 TABLET(20 MG) BY MOUTH DAILY 04/05/22   Corwin Levins, MD  tadalafil (CIALIS) 20 MG tablet Take 0.5-1 tablets (10-20 mg total) by mouth every other day as needed for erectile dysfunction. 04/05/22   Corwin Levins, MD  tamsulosin (FLOMAX) 0.4 MG CAPS capsule Take 1 capsule (0.4 mg total) by mouth daily. 04/05/22   Corwin Levins, MD  tirzepatide [facility]) 2.5 MG/0.5ML Pen Inject 2.5 mg into the skin once a week. 04/05/22   Corwin Levins, MD  tiZANidine (ZANAFLEX) 4 MG tablet Take 4 mg by mouth 3 (three) times daily as needed. 06/11/21   [provider]  tolterodine (DETROL LA) 4 MG 24 hr capsule Take 1 capsule (4 mg total) by mouth daily. Patient not taking: Reported on 05/11/2022 04/05/22   Corwin Levins, MD  vitamin B-12 (CYANOCOBALAMIN) 1000 MCG tablet Take 1 tablet (1,000 mcg total) by mouth daily. 08/08/19   Corwin Levins, MD  zinc gluconate 50 MG tablet Take 50 mg by mouth daily.    [provider]      Allergies    Diphenhydramine, Beta adrenergic blockers, Colchicine, Lipitor [atorvastatin], Pecan nut (diagnostic), and Septra [sulfamethoxazole-trimethoprim]    Review of Systems   Review of Systems  Musculoskeletal:  Positive for back pain.  All other systems reviewed and are negative.   Physical Exam Updated Vital Signs BP (!) 157/102   Pulse 63   Resp 19   Ht 5\' 11"  (1.803 m)   Wt 117.9 kg   SpO2 100%   BMI 36.26 kg/m  Physical Exam Vitals and nursing note reviewed.  Constitutional:       Appearance: He is well-developed.  HENT:     Head: Normocephalic and atraumatic.  Cardiovascular:     Rate and Rhythm: Normal rate and regular rhythm.  Pulmonary:     Effort: Pulmonary effort is normal. No respiratory distress.     Breath sounds: Normal breath sounds.  Abdominal:     Palpations: Abdomen is soft.     Tenderness: There is no abdominal tenderness. There is no guarding or rebound.  Musculoskeletal:     Comments: 2+ DP pulses bilaterally.  TTP over right knee  without erythema or edema.  Able to flex/extend at the knee.  TTP over lower back at surgical site and left lower back.  No open wounds or drainage.  Trace edema to BLE.   Skin:    General: Skin is warm and dry.  Neurological:     Mental Status: He is alert and oriented to person, place, and time.     Comments: 5/5 strength in BLE with sensation to light touch intact in BLE.   Psychiatric:        Behavior: Behavior normal.     ED Results / Procedures / Treatments   Labs (all labs ordered are listed, but only abnormal results are displayed) Labs Reviewed - No data to display  EKG None  Radiology DG Lumbar Spine Complete  Result Date: 07/12/2022 CLINICAL DATA:  57 year old male with history of worsening back pain after a fall in March two thousand twenty-four. EXAM: LUMBAR SPINE - COMPLETE 4+ VIEW COMPARISON:  06/23/2022. FINDINGS: Five views of the lumbar spine again demonstrate postoperative changes of prior PLIF at L4-L5. Interbody cages are noted at L3-L4 and L4-L5 interspace is. No acute hardware complication identified. Alignment is anatomic. No definite acute displaced fractures or compression type fractures are noted. Patient is also status post laminectomy at L1-L5. Multilevel degenerative disc disease, most pronounced at L3-L4 and L4-L5, similar to the prior study. IMPRESSION: 1. No acute radiographic abnormality of the lumbar spine. Postoperative changes, similar to the prior study, as above.  Electronically Signed   By: Trudie Reed M.D.   On: 07/12/2022 05:58   DG Knee Complete 4 Views Right  Result Date: 07/11/2022 CLINICAL DATA:  Right knee pain/swelling EXAM: RIGHT KNEE - COMPLETE 4+ VIEW COMPARISON:  None Available. FINDINGS: Moderate tricompartmental degenerative changes. No fracture or dislocation is seen. Visualized soft tissues are within normal limits. No suprapatellar knee joint effusion. IMPRESSION: No fracture or dislocation is seen. Moderate degenerative changes. Electronically Signed   By: Charline Bills M.D.   On: 07/11/2022 22:41    Procedures Procedures    Medications Ordered in ED Medications  ketorolac (TORADOL) injection 60 mg (60 mg Intramuscular Given 07/12/22 0421)  HYDROmorphone (DILAUDID) injection 1 mg (1 mg Intramuscular Given 07/12/22 0620)    ED Course/ Medical Decision Making/ A&P                             Medical Decision Making Amount and/or Complexity of Data Reviewed Radiology: ordered.  Risk Prescription drug management.   Patient with history of hypertension, prediabetes, multiple prior back surgeries here for evaluation of worsening pain in his low back and on the left side following a fall that occurred about 1 month ago as well as worsening pain in his right knee due to favoring that knee.  He is well-perfused on examination and neurologically intact.  Current clinical picture is not consistent with acute infectious process.  Plain films are negative for acute fracture.  Films are with stable degenerative changes.  He was treated with Toradol, hydromorphone for pain control.  He is currently on gabapentin, Percocet and ibuprofen.  He will need to follow-up with his primary doctor or pain management regarding medication adjustments for pain control.  Plan to discharge with outpatient follow-up and return precautions.       Final Clinical Impression(s) / ED Diagnoses Final diagnoses:  Chronic left-sided low back pain  without sciatica  Acute pain of right knee  Rx / DC Orders ED Discharge Orders     None         Tilden Fossa, MD 07/12/22 0700

## 2022-07-12 NOTE — ED Triage Notes (Signed)
Pt states he has had recent back surgery and compensating on right knee and also had a fall at the end of March.  Pt states he goes to a pain clinic but cannot "take the pain" tonight.

## 2022-07-12 NOTE — ED Notes (Signed)
PT was d/c prior to getting vital signs.

## 2022-07-12 NOTE — ED Notes (Signed)
Pt informed registration he was leaving.  

## 2022-07-15 DIAGNOSIS — M1711 Unilateral primary osteoarthritis, right knee: Secondary | ICD-10-CM | POA: Diagnosis not present

## 2022-07-18 ENCOUNTER — Ambulatory Visit (INDEPENDENT_AMBULATORY_CARE_PROVIDER_SITE_OTHER): Payer: Medicare HMO | Admitting: Podiatry

## 2022-07-18 ENCOUNTER — Encounter: Payer: Self-pay | Admitting: Podiatry

## 2022-07-18 DIAGNOSIS — M7752 Other enthesopathy of left foot: Secondary | ICD-10-CM | POA: Diagnosis not present

## 2022-07-18 DIAGNOSIS — E0843 Diabetes mellitus due to underlying condition with diabetic autonomic (poly)neuropathy: Secondary | ICD-10-CM

## 2022-07-18 DIAGNOSIS — B351 Tinea unguium: Secondary | ICD-10-CM

## 2022-07-18 DIAGNOSIS — M79674 Pain in right toe(s): Secondary | ICD-10-CM | POA: Diagnosis not present

## 2022-07-18 DIAGNOSIS — M7751 Other enthesopathy of right foot: Secondary | ICD-10-CM | POA: Diagnosis not present

## 2022-07-18 DIAGNOSIS — M79675 Pain in left toe(s): Secondary | ICD-10-CM | POA: Diagnosis not present

## 2022-07-18 MED ORDER — TRIAMCINOLONE ACETONIDE 10 MG/ML IJ SUSP
20.0000 mg | Freq: Once | INTRAMUSCULAR | Status: AC
Start: 2022-07-18 — End: 2022-07-18
  Administered 2022-07-18: 20 mg

## 2022-07-19 ENCOUNTER — Encounter: Payer: Self-pay | Admitting: Internal Medicine

## 2022-07-19 ENCOUNTER — Ambulatory Visit (INDEPENDENT_AMBULATORY_CARE_PROVIDER_SITE_OTHER): Payer: Medicare HMO | Admitting: Internal Medicine

## 2022-07-19 VITALS — BP 130/80 | HR 50 | Temp 98.0°F | Ht 71.0 in | Wt 263.0 lb

## 2022-07-19 DIAGNOSIS — R300 Dysuria: Secondary | ICD-10-CM

## 2022-07-19 DIAGNOSIS — I10 Essential (primary) hypertension: Secondary | ICD-10-CM | POA: Diagnosis not present

## 2022-07-19 DIAGNOSIS — E785 Hyperlipidemia, unspecified: Secondary | ICD-10-CM

## 2022-07-19 DIAGNOSIS — E1169 Type 2 diabetes mellitus with other specified complication: Secondary | ICD-10-CM

## 2022-07-19 LAB — LIPID PANEL
Cholesterol: 142 mg/dL (ref 0–200)
HDL: 40.7 mg/dL (ref >39.00)
LDL Cholesterol: 85 mg/dL (ref 0–99)
NonHDL: 101.17
Total CHOL/HDL Ratio: 3
Triglycerides: 79 mg/dL (ref 0.0–149.0)
VLDL: 15.8 mg/dL (ref 0.0–40.0)

## 2022-07-19 LAB — HEPATIC FUNCTION PANEL
ALT: 12 U/L (ref 0–53)
AST: 14 U/L (ref 0–37)
Albumin: 4.4 g/dL (ref 3.5–5.2)
Alkaline Phosphatase: 71 U/L (ref 39–117)
Bilirubin, Direct: 0.1 mg/dL (ref 0.0–0.3)
Total Bilirubin: 0.4 mg/dL (ref 0.2–1.2)
Total Protein: 7.2 g/dL (ref 6.0–8.3)

## 2022-07-19 LAB — URINALYSIS, ROUTINE W REFLEX MICROSCOPIC
Bilirubin Urine: NEGATIVE
Hgb urine dipstick: NEGATIVE
Ketones, ur: NEGATIVE
Leukocytes,Ua: NEGATIVE
Nitrite: NEGATIVE
Specific Gravity, Urine: 1.015 (ref 1.000–1.030)
Total Protein, Urine: NEGATIVE
Urine Glucose: NEGATIVE
Urobilinogen, UA: 0.2 (ref 0.0–1.0)
pH: 6.5 (ref 5.0–8.0)

## 2022-07-19 LAB — BASIC METABOLIC PANEL
BUN: 19 mg/dL (ref 6–23)
CO2: 28 mEq/L (ref 19–32)
Calcium: 9.7 mg/dL (ref 8.4–10.5)
Chloride: 102 mEq/L (ref 96–112)
Creatinine, Ser: 1.08 mg/dL (ref 0.40–1.50)
GFR: 77.54 mL/min (ref >60.00)
Glucose, Bld: 106 mg/dL — ABNORMAL HIGH (ref 70–99)
Potassium: 3.9 mEq/L (ref 3.5–5.1)
Sodium: 137 mEq/L (ref 135–145)

## 2022-07-19 LAB — HEMOGLOBIN A1C: Hgb A1c MFr Bld: 6.4 % (ref 4.6–6.5)

## 2022-07-19 MED ORDER — CEPHALEXIN 500 MG PO CAPS: 500.0000 mg | ORAL_CAPSULE | Freq: Three times a day (TID) | ORAL | 0 refills | Status: DC

## 2022-07-19 MED ORDER — TIRZEPATIDE 5 MG/0.5ML ~~LOC~~ SOAJ
5.0000 mg | SUBCUTANEOUS | 3 refills | Status: DC
Start: 2022-07-19 — End: 2023-02-20

## 2022-07-19 MED ORDER — CEFTRIAXONE SODIUM 1 G IJ SOLR
1.0000 g | Freq: Once | INTRAMUSCULAR | Status: AC
Start: 2022-07-19 — End: 2022-07-19
  Administered 2022-07-19: 1 g via INTRAMUSCULAR

## 2022-07-19 NOTE — Patient Instructions (Addendum)
You had the antibiotic shot today - rocephin  Please take all new medication as prescribed -- the pill antibiotic  Ok to increase the mounjaro to 5 mg weekly  Ok to take the metformin 1 pill for blood sugar > 150  Please continue all other medications as before, and refills have been done if requested.  Please have the pharmacy call with any other refills you may need.  Please keep your appointments with your specialists as you may have planned  - the CT and NS later this week  Please go to the LAB at the blood drawing area for the tests to be done  You will be contacted by phone if any changes need to be made immediately.  Otherwise, you will receive a letter about your results with an explanation, but please check with MyChart first.  Please remember to sign up for MyChart if you have not done so, as this will be important to you in the future with finding out test results, communicating by private email, and scheduling acute appointments online when needed.  Please make an Appointment to return in 4 months, or sooner if needed

## 2022-07-19 NOTE — Progress Notes (Signed)
Patient ID: Harold [name], male   DOB: 13-Nov-1965, 57 y.o.   MRN: [id number]        Chief Complaint: follow up HTN, HLD and DM, low  back pain, dysuria and frequency       HPI:  Harold [name] is a 57 y.o. male here with c/o 1 wk dysuria and frequency, Denies urinary symptoms such as urgency, flank pain, hematuria or n/v, fever, chills.  Pt continues to have recurring LBP without change in severity, bowel or bladder change, fever, wt loss,  worsening LE pain/numbness/weakness, or falls no s/o lumbar surgury feb 2024, walks occasionally with walker, now with mild worsening right knee pain it seems with gait change.  Also has ongoing right shoudler pain and soreness.  Tolerating mounjaro 25 mg well.  Still has mild elevated sugars sometimes > 150 at home, wants to know if he can take metformin 500 mg prn for this.  Still smoking not ready to quit.         Wt Readings from Last 3 Encounters:  07/19/22 263 lb (119.3 kg)  07/12/22 260 lb (117.9 kg)  07/11/22 260 lb (117.9 kg)   BP Readings from Last 3 Encounters:  07/19/22 130/80  07/12/22 (!) 157/102  07/11/22 (!) 180/99         Past Medical History:  Diagnosis Date   Acute idiopathic pericarditis 04/08/2010   Qualifier: Diagnosis of  By: Huntley Dec, [name]     Acute sinusitis 06/04/2014   Anxiety    CHF (congestive heart failure) (HCC) 06/29/2010   was pericardititis not CHF   Chronic back pain    Constipation    DDD (degenerative disc disease), lumbar 09/14/2012   Depression    Erectile dysfunction 09/14/2012   GERD (gastroesophageal reflux disease)    ocassional   Gout, unspecified 05/01/2007   Qualifier: Diagnosis of  By: Tawanna Cooler MD, Tinnie Gens A    Headache    migraines in the past   History of pancreatitis    a. admx 04-2009.Marland KitchenMarland Kitchen? 2-2 triglycerides   Hypertension    Hypertriglyceridemia    a. followed by [facility]   Left sided sciatica 09/14/2012   Neuropathy    Obesity    OSA (obstructive sleep apnea)    CPAP- not current    Restless legs    Type 2 diabetes mellitus with diabetic neuropathy, unspecified (HCC) 09/20/2019   Past Surgical History:  Procedure Laterality Date   ANTERIOR CERVICAL DECOMP/DISCECTOMY FUSION N/A 09/14/2020   Procedure: Anterior Cervical Decompression Fusion - Cervical three-Cervical four;  Surgeon: Donalee Citrin, MD;  Location: [facility] OR;  Service: Neurosurgery;  Laterality: N/A;   BACK SURGERY  2021   lumbar fusion   COLONOSCOPY     ELBOW SURGERY Left    KNEE SURGERY Right    x3 scopes   KNEE SURGERY Right    open menisicus repair   LUMBAR LAMINECTOMY/DECOMPRESSION MICRODISCECTOMY N/A 08/26/2015   Procedure: Lumbar Two-Sacral One  Laminectomy for decompression;  Surgeon: Loura Halt Ditty, MD;  Location: [facility];  Service: Neurosurgery;  Laterality: N/A;   pericardectomy  07/15/2010   Dr. Tyrone Sage   pleurx catheter placement  07/15/2010   Dr. Tyrone Sage   SHOULDER SURGERY     right and left shoulders- Rotator cuff repair   SHOULDER SURGERY Right 2023   TONSILLECTOMY AND ADENOIDECTOMY     one tonsil    reports that he has been smoking cigarettes. He has a 18.00 pack-year smoking history. He quit  smokeless tobacco use about 3 years ago.  His smokeless tobacco use included snuff. He reports current alcohol use. He reports that he does not use drugs. family history includes Colon cancer (age of onset: 19) in his father; Diabetes in his father, mother, and sister; Heart disease (age of onset: 44) in his father; Hypertension in an other family member; Liver cancer in his paternal uncle; Pancreatitis in his father and paternal uncle; Stomach cancer in his maternal grandfather. Allergies  Allergen Reactions   Diphenhydramine Palpitations and Other (See Comments)    "jittery" Pt states he can take Benadryl in an emergency situation   Beta Adrenergic Blockers Other (See Comments)    Erectile dysfxn   Colchicine Diarrhea   Lipitor [Atorvastatin] Other (See Comments)    Muscle cramps    Pecan Nut (Diagnostic) Palpitations    Pecans: itching and swelling of the tongue    Septra [Sulfamethoxazole-Trimethoprim] Itching   Current Outpatient Medications on File Prior to Visit  Medication Sig Dispense Refill   amLODipine (NORVASC) 10 MG tablet Take 1 tablet (10 mg total) by mouth daily. 90 tablet 3   Aromatic Inhalants (VICKS VAPOINHALER IN) Inhale 1 puff into the lungs daily as needed (congestion).     ascorbic acid (VITAMIN C) 500 MG tablet Take 500 mg by mouth daily.     aspirin EC 81 MG tablet Take 81 mg by mouth daily.     aspirin-sod bicarb-citric acid (ALKA-SELTZER) 325 MG TBEF tablet Take 650 mg by mouth every 6 (six) hours as needed (indigestion).     Blood Glucose Monitoring Suppl (ONE TOUCH ULTRA 2) w/Device KIT Use as directed daily E11.9 1 each 0   Cholecalciferol (VITAMIN D3 PO) Take 1 capsule by mouth daily.     citalopram (CELEXA) 40 MG tablet Take 1 tablet (40 mg total) by mouth daily. 90 tablet 3   Continuous Blood Gluc Receiver (DEXCOM G7 RECEIVER) DEVI Use as directed once per day E11.9 1 each 0   Continuous Blood Gluc Sensor (DEXCOM G7 SENSOR) MISC Use as directed once every 2 weeks E11.9 6 each 3   cyclobenzaprine (FLEXERIL) 10 MG tablet Take 1 tablet (10 mg total) by mouth 3 (three) times daily as needed for muscle spasms. 30 tablet 2   fenofibrate (TRICOR) 145 MG tablet Take 1 tablet (145 mg total) by mouth daily. TAKE 1 TABLET(145 MG) BY MOUTH DAILY 90 tablet 3   fluticasone (FLONASE) 50 MCG/ACT nasal spray Place 2 sprays into both nostrils daily. (Patient taking differently: Place 2 sprays into both nostrils daily as needed for allergies.) 16 g 6   gabapentin (NEURONTIN) 300 MG capsule Take 900 mg by mouth 3 (three) times daily.     glucose blood test strip Use as instructed once daily E119 100 each 12   hydrochlorothiazide (MICROZIDE) 12.5 MG capsule Take 1 capsule (12.5 mg total) by mouth daily. 90 capsule 3   Lancets MISC Use as directed daily E11.9 100  each 11   lisinopril (ZESTRIL) 40 MG tablet TAKE 1 TABLET(40 MG) BY MOUTH DAILY 90 tablet 3   loratadine (CLARITIN) 10 MG tablet TAKE 1 TABLET(10 MG) BY MOUTH DAILY AS NEEDED FOR ALLERGIES 90 tablet 3   Magnesium 500 MG TABS Take 500 mg by mouth daily.     metFORMIN (GLUCOPHAGE-XR) 500 MG 24 hr tablet TAKE 2 TABLETS BY MOUTH DAILY WITH BREAKFAST 180 tablet 3   oxyCODONE (ROXICODONE) 15 MG immediate release tablet Take 1 tablet (15 mg total) by mouth  every 4 (four) hours as needed for severe pain. 30 tablet 0   Phenylephrine-Acetaminophen (TYLENOL SINUS CONGESTION/PAIN PO) Take 2 tablets by mouth 3 (three) times daily as needed (congestion).     Potassium Chloride ER 20 MEQ TBCR Take 1 tablet by mouth daily. 90 tablet 3   rosuvastatin (CRESTOR) 20 MG tablet Take 1 tablet (20 mg total) by mouth daily. TAKE 1 TABLET(20 MG) BY MOUTH DAILY 90 tablet 3   tadalafil (CIALIS) 20 MG tablet Take 0.5-1 tablets (10-20 mg total) by mouth every other day as needed for erectile dysfunction. 10 tablet 11   tamsulosin (FLOMAX) 0.4 MG CAPS capsule Take 1 capsule (0.4 mg total) by mouth daily. 90 capsule 3   tiZANidine (ZANAFLEX) 4 MG tablet Take 4 mg by mouth 3 (three) times daily as needed.     tolterodine (DETROL LA) 4 MG 24 hr capsule Take 1 capsule (4 mg total) by mouth daily. 90 capsule 3   vitamin B-12 (CYANOCOBALAMIN) 1000 MCG tablet Take 1 tablet (1,000 mcg total) by mouth daily. 90 tablet 1   zinc gluconate 50 MG tablet Take 50 mg by mouth daily.     No current facility-administered medications on file prior to visit.        ROS:  All others reviewed and negative.  Objective        PE:  BP 130/80 (BP Location: Right Arm, Patient Position: Sitting, Cuff Size: Normal)   Pulse (!) 50   Temp 98 F (36.7 C) (Oral)   Ht 5\' 11"  (1.803 m)   Wt 263 lb (119.3 kg)   SpO2 99%   BMI 36.68 kg/m                 Constitutional: Pt appears in NAD               HENT: Head: NCAT.                Right Ear:  External ear normal.                 Left Ear: External ear normal.                Eyes: . Pupils are equal, round, and reactive to light. Conjunctivae and EOM are normal               Nose: without d/c or deformity               Neck: Neck supple. Gross normal ROM               Cardiovascular: Normal rate and regular rhythm.                 Pulmonary/Chest: Effort normal and breath sounds without rales or wheezing.                Abd:  Soft, NT, ND, + BS, no organomegaly               Neurological: Pt is alert. At baseline orientation, motor grossly intact               Skin: Skin is warm. No rashes, no other new lesions, LE edema - none               Psychiatric: Pt behavior is normal without agitation   Micro: none  Cardiac tracings I have personally interpreted today:  none  Pertinent Radiological findings (summarize): none   Lab Results  Component Value Date   WBC 15.9 (H) 05/13/2022   HGB 14.3 05/13/2022   HCT 44.4 05/13/2022   PLT 427 (H) 05/13/2022   GLUCOSE 106 (H) 07/19/2022   CHOL 142 07/19/2022   TRIG 79.0 07/19/2022   HDL 40.70 07/19/2022   LDLDIRECT 124.0 04/05/2022   LDLCALC 85 07/19/2022   ALT 12 07/19/2022   AST 14 07/19/2022   NA 137 07/19/2022   K 3.9 07/19/2022   CL 102 07/19/2022   CREATININE 1.08 07/19/2022   BUN 19 07/19/2022   CO2 28 07/19/2022   TSH 0.50 04/05/2022   PSA 1.73 04/05/2022   INR 1.72 (H) 07/05/2010   HGBA1C 6.4 07/19/2022   MICROALBUR 3.1 (H) 04/05/2022   Assessment/Plan:  Harold [name] is a 57 y.o. Black or African American [2] male with  has a past medical history of Acute idiopathic pericarditis (04/08/2010), Acute sinusitis (06/04/2014), Anxiety, CHF (congestive heart failure) (HCC) (06/29/2010), Chronic back pain, Constipation, DDD (degenerative disc disease), lumbar (09/14/2012), Depression, Erectile dysfunction (09/14/2012), GERD (gastroesophageal reflux disease), Gout, unspecified (05/01/2007), Headache, History of pancreatitis,  Hypertension, Hypertriglyceridemia, Left sided sciatica (09/14/2012), Neuropathy, Obesity, OSA (obstructive sleep apnea), Restless legs, and Type 2 diabetes mellitus with diabetic neuropathy, unspecified (HCC) (09/20/2019).  Dysuria Exam benign, can't r/o uti, for rocephin 1 gm I'm x 1, cephalexin asd, and urine culture  Type 2 diabetes mellitus (HCC) Lab Results  Component Value Date   HGBA1C 6.4 07/19/2022   Mild uncontrolled, pt to continue current medical treatment mefomrin ER 500 mg - 1 qd prn cbg > 150, also ok to increase the mounjaro to 5 mg weekly   Essential hypertension BP Readings from Last 3 Encounters:  07/19/22 130/80  07/12/22 (!) 157/102  07/11/22 (!) 180/99   Stable, pt to continue medical treatment norvasc 10 mg qd, hct 12.5 mg qd, lisinopril 20 mg qd   Hyperlipidemia Lab Results  Component Value Date   LDLCALC 85 07/19/2022   Uncontrolled, goal ldl < 70,  pt to continue current statin crestor 20 gm qed, tricor 140 mg qd and lower chol diet, as declines increased crestor fo rnow  Followup: No follow-ups on file.  Oliver Barre, MD 07/22/2022 4:44 PM [facility]

## 2022-07-19 NOTE — Progress Notes (Signed)
The test results show that your current treatment is OK, as the tests are stable.  Please continue the same plan.  There is no other need for change of treatment or further evaluation based on these results, at this time.  thanks 

## 2022-07-20 ENCOUNTER — Ambulatory Visit
Admission: RE | Admit: 2022-07-20 | Discharge: 2022-07-20 | Disposition: A | Discharge status: Home / Self Care | Payer: Medicare HMO | Source: Ambulatory Visit | Attending: Neurosurgery | Admitting: Neurosurgery

## 2022-07-20 DIAGNOSIS — S32009A Unspecified fracture of unspecified lumbar vertebra, initial encounter for closed fracture: Secondary | ICD-10-CM | POA: Diagnosis not present

## 2022-07-20 DIAGNOSIS — M545 Low back pain, unspecified: Secondary | ICD-10-CM | POA: Diagnosis not present

## 2022-07-20 DIAGNOSIS — M79605 Pain in left leg: Secondary | ICD-10-CM | POA: Diagnosis not present

## 2022-07-20 DIAGNOSIS — S32009K Unspecified fracture of unspecified lumbar vertebra, subsequent encounter for fracture with nonunion: Secondary | ICD-10-CM

## 2022-07-20 LAB — URINE CULTURE

## 2022-07-20 NOTE — Progress Notes (Signed)
Subjective:   Patient ID: Harold [name], male   DOB: 57 y.o.   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 Logan Bores after he returns from his trip to discuss.  I am trying I am going to give him temporary relief he understands that this is the last time we are going to do this and I went ahead did sterile prep and carefully did periarticular injections around the first MPJ 3 mg dexamethasone Kenalog 5 mg Xylocaine bilateral

## 2022-07-22 NOTE — Assessment & Plan Note (Signed)
Lab Results  Component Value Date   HGBA1C 6.4 07/19/2022   Mild uncontrolled, pt to continue current medical treatment mefomrin ER 500 mg - 1 qd prn cbg > 150, also ok to increase the mounjaro to 5 mg weekly

## 2022-07-22 NOTE — Assessment & Plan Note (Signed)
BP Readings from Last 3 Encounters:  07/19/22 130/80  07/12/22 (!) 157/102  07/11/22 (!) 180/99   Stable, pt to continue medical treatment norvasc 10 mg qd, hct 12.5 mg qd, lisinopril 20 mg qd

## 2022-07-22 NOTE — Assessment & Plan Note (Addendum)
Exam benign, can't r/o uti, for rocephin 1 gm I'm x 1, cephalexin asd, and urine culture

## 2022-07-22 NOTE — Assessment & Plan Note (Signed)
Lab Results  Component Value Date   LDLCALC 85 07/19/2022   Uncontrolled, goal ldl < 70,  pt to continue current statin crestor 20 gm qed, tricor 140 mg qd and lower chol diet, as declines increased crestor fo rnow

## 2022-08-10 ENCOUNTER — Ambulatory Visit: Payer: Medicare HMO | Admitting: Podiatry

## 2022-08-17 DIAGNOSIS — M25512 Pain in left shoulder: Secondary | ICD-10-CM | POA: Diagnosis not present

## 2022-08-17 DIAGNOSIS — Z9889 Other specified postprocedural states: Secondary | ICD-10-CM | POA: Diagnosis not present

## 2022-08-17 DIAGNOSIS — M1711 Unilateral primary osteoarthritis, right knee: Secondary | ICD-10-CM | POA: Diagnosis not present

## 2022-08-17 DIAGNOSIS — G894 Chronic pain syndrome: Secondary | ICD-10-CM | POA: Diagnosis not present

## 2022-08-28 NOTE — Progress Notes (Unsigned)
  Cardiology Office Note:   Date:  08/30/2022  ID:  Harold [name], DOB 12-14-1965, MRN [id number] PCP: Corwin Levins, MD  [facility] Providers Cardiologist:  Rollene Rotunda, MD     History of Present Illness:   Harold [name] is a 57 y.o. male who presents for evaluation of multiple cardiovascular risk factors.  I saw him in the past for pericarditis.    In 2017 he had a POET (Plain Old Exercise Treadmill) after the last visit.  He had no ischemia but did have a hypertensive response.   He has had pericarditis in the past.   Since I last saw him he has done well.  He just had back surgery.  He is lost weight on Mounjaro.  He denies any cardiovascular symptoms.  Has not been having any chest pressure, neck or arm discomfort.  Has not been having any shortness of breath, PND or orthopnea.  He had no edema.  He might need to have knee surgery and shoulder surgery.  ROS: As stated in the HPI and negative for all other systems.  Studies Reviewed:    EKG: Sinus rhythm, rate 64, right axis deviation, no change from previous.   Risk Assessment/Calculations:              Physical Exam:   VS:  BP 123/82 (BP Location: Left Arm, Patient Position: Sitting, Cuff Size: Normal)   Pulse 64   Ht 5\' 11"  (1.803 m)   Wt 252 lb 12.8 oz (114.7 kg)   SpO2 99%   BMI 35.26 kg/m    Wt Readings from Last 3 Encounters:  08/30/22 252 lb 12.8 oz (114.7 kg)  07/19/22 263 lb (119.3 kg)  07/12/22 260 lb (117.9 kg)     GEN: Well nourished, well developed in no acute distress NECK: No JVD; No carotid bruits CARDIAC: RRR, no murmurs, rubs, gallops RESPIRATORY:  Clear to auscultation without rales, wheezing or rhonchi  ABDOMEN: Soft, non-tender, non-distended EXTREMITIES:  No edema; No deformity   ASSESSMENT AND PLAN:   OBESITY:   Patient's had weight loss and just had his dose of Mounjaro increased.  He is anxious to be able to start exercising.  DM:  A1c was 6.4.  No change from previous.  Meds  per primary provider.   TOBACCO ABUSE:   We again talked about the need to stop smoking.   HTN:     BP is at target.  No change in therapy.   QT PROLONGED: QT is normal.  No further testing.   SLEEP APNEA:    He uses BiPAP sometimes.   PERICARDIAL EFFUSION:    He has no symptoms consistent with recurrent pericarditis or pericardial effusion.  No further imaging.         Signed, Rollene Rotunda, MD

## 2022-08-29 ENCOUNTER — Ambulatory Visit: Payer: Medicare HMO | Admitting: Cardiology

## 2022-08-30 ENCOUNTER — Ambulatory Visit: Payer: Medicare HMO | Attending: Cardiology | Admitting: Cardiology

## 2022-08-30 ENCOUNTER — Encounter: Payer: Self-pay | Admitting: Emergency Medicine

## 2022-08-30 ENCOUNTER — Encounter: Payer: Self-pay | Admitting: Cardiology

## 2022-08-30 ENCOUNTER — Ambulatory Visit: Payer: Medicare HMO | Admitting: Emergency Medicine

## 2022-08-30 VITALS — BP 123/82 | HR 64 | Ht 71.0 in | Wt 252.8 lb

## 2022-08-30 DIAGNOSIS — I3139 Other pericardial effusion (noninflammatory): Secondary | ICD-10-CM | POA: Diagnosis not present

## 2022-08-30 DIAGNOSIS — Z136 Encounter for screening for cardiovascular disorders: Secondary | ICD-10-CM

## 2022-08-30 NOTE — Patient Instructions (Signed)
   Follow-Up: At [facility], you and your health needs are our priority.  As part of our continuing mission to provide you with exceptional heart care, we have created designated Provider Care Teams.  These Care Teams include your primary Cardiologist (physician) and Advanced Practice Providers (APPs -  Physician Assistants and Nurse Practitioners) who all work together to provide you with the care you need, when you need it.  We recommend signing up for the patient portal called "MyChart".  Sign up information is provided on this After Visit Summary.  MyChart is used to connect with patients for Virtual Visits (Telemedicine).  Patients are able to view lab/test results, encounter notes, upcoming appointments, etc.  Non-urgent messages can be sent to your provider as well.   To learn more about what you can do with MyChart, go to [email or web address].    Your next appointment:   12 month(s)  Provider:   James Hochrein, MD      

## 2022-09-12 ENCOUNTER — Ambulatory Visit: Payer: Medicare HMO | Admitting: Internal Medicine

## 2022-09-13 ENCOUNTER — Ambulatory Visit: Payer: Medicare HMO | Admitting: Internal Medicine

## 2022-09-22 DIAGNOSIS — S32009K Unspecified fracture of unspecified lumbar vertebra, subsequent encounter for fracture with nonunion: Secondary | ICD-10-CM | POA: Diagnosis not present

## 2022-09-22 DIAGNOSIS — Z6835 Body mass index (BMI) 35.0-35.9, adult: Secondary | ICD-10-CM | POA: Diagnosis not present

## 2022-09-22 DIAGNOSIS — M5440 Lumbago with sciatica, unspecified side: Secondary | ICD-10-CM | POA: Diagnosis not present

## 2022-09-22 DIAGNOSIS — M25512 Pain in left shoulder: Secondary | ICD-10-CM | POA: Diagnosis not present

## 2022-11-09 DIAGNOSIS — Z9889 Other specified postprocedural states: Secondary | ICD-10-CM | POA: Diagnosis not present

## 2022-11-09 DIAGNOSIS — M25512 Pain in left shoulder: Secondary | ICD-10-CM | POA: Diagnosis not present

## 2022-11-09 DIAGNOSIS — G894 Chronic pain syndrome: Secondary | ICD-10-CM | POA: Diagnosis not present

## 2022-11-09 DIAGNOSIS — M1711 Unilateral primary osteoarthritis, right knee: Secondary | ICD-10-CM | POA: Diagnosis not present

## 2022-11-11 ENCOUNTER — Ambulatory Visit: Payer: Medicare HMO

## 2022-11-11 DIAGNOSIS — M1711 Unilateral primary osteoarthritis, right knee: Secondary | ICD-10-CM | POA: Diagnosis not present

## 2022-11-14 ENCOUNTER — Other Ambulatory Visit: Payer: Self-pay | Admitting: Internal Medicine

## 2022-11-15 ENCOUNTER — Other Ambulatory Visit: Payer: Self-pay | Admitting: Internal Medicine

## 2022-11-15 DIAGNOSIS — R8290 Unspecified abnormal findings in urine: Secondary | ICD-10-CM

## 2022-11-15 NOTE — Telephone Encounter (Signed)
Please to contact pt -   Ok for UA and culture to check the urine; hold on the antibiotic for now, though if he has infection it would be needed and he may need to see urology as well  I will place the order for the ua and culture

## 2022-11-15 NOTE — Telephone Encounter (Signed)
Called and left voice mail

## 2022-11-17 ENCOUNTER — Other Ambulatory Visit (INDEPENDENT_AMBULATORY_CARE_PROVIDER_SITE_OTHER): Payer: Medicare HMO

## 2022-11-17 ENCOUNTER — Encounter: Payer: Self-pay | Admitting: Internal Medicine

## 2022-11-17 DIAGNOSIS — R8290 Unspecified abnormal findings in urine: Secondary | ICD-10-CM | POA: Diagnosis not present

## 2022-11-17 DIAGNOSIS — R972 Elevated prostate specific antigen [PSA]: Secondary | ICD-10-CM

## 2022-11-17 LAB — URINALYSIS, ROUTINE W REFLEX MICROSCOPIC
Bilirubin Urine: NEGATIVE
Hgb urine dipstick: NEGATIVE
Ketones, ur: NEGATIVE
Leukocytes,Ua: NEGATIVE
Nitrite: NEGATIVE
RBC / HPF: NONE SEEN (ref 0–?)
Specific Gravity, Urine: 1.02 (ref 1.000–1.030)
Total Protein, Urine: NEGATIVE
Urine Glucose: NEGATIVE
Urobilinogen, UA: 0.2 (ref 0.0–1.0)
pH: 6.5 (ref 5.0–8.0)

## 2022-11-19 LAB — URINE CULTURE
MICRO NUMBER:: 15368239
Result:: NO GROWTH
SPECIMEN QUALITY:: ADEQUATE

## 2022-12-23 DIAGNOSIS — S32009K Unspecified fracture of unspecified lumbar vertebra, subsequent encounter for fracture with nonunion: Secondary | ICD-10-CM | POA: Diagnosis not present

## 2022-12-23 DIAGNOSIS — Z6834 Body mass index (BMI) 34.0-34.9, adult: Secondary | ICD-10-CM | POA: Diagnosis not present

## 2022-12-23 DIAGNOSIS — M5440 Lumbago with sciatica, unspecified side: Secondary | ICD-10-CM | POA: Diagnosis not present

## 2023-01-09 ENCOUNTER — Ambulatory Visit: Payer: Medicare HMO | Admitting: Podiatry

## 2023-01-09 DIAGNOSIS — M1711 Unilateral primary osteoarthritis, right knee: Secondary | ICD-10-CM | POA: Diagnosis not present

## 2023-01-09 DIAGNOSIS — M79675 Pain in left toe(s): Secondary | ICD-10-CM | POA: Diagnosis not present

## 2023-01-09 DIAGNOSIS — B351 Tinea unguium: Secondary | ICD-10-CM

## 2023-01-09 DIAGNOSIS — M79674 Pain in right toe(s): Secondary | ICD-10-CM | POA: Diagnosis not present

## 2023-01-09 MED ORDER — CLOTRIMAZOLE-BETAMETHASONE 1-0.05 % EX CREA: 1.0000 | TOPICAL_CREAM | Freq: Every day | CUTANEOUS | 1 refills | Status: DC

## 2023-01-10 NOTE — Progress Notes (Signed)
   Chief Complaint  Patient presents with   Diabetes    [facility]    SUBJECTIVE Patient with a history of diabetes mellitus presents to office today complaining of elongated, thickened nails that cause pain while ambulating in shoes.  Patient is unable to trim their own nails. Patient is here for further evaluation and treatment.  Past Medical History:  Diagnosis Date   Acute idiopathic pericarditis 04/08/2010   Qualifier: Diagnosis of  By: Huntley Dec, [name]     Acute sinusitis 06/04/2014   Anxiety    CHF (congestive heart failure) (HCC) 06/29/2010   was pericardititis not CHF   Chronic back pain    Constipation    DDD (degenerative disc disease), lumbar 09/14/2012   Depression    Erectile dysfunction 09/14/2012   GERD (gastroesophageal reflux disease)    ocassional   Gout, unspecified 05/01/2007   Qualifier: Diagnosis of  By: Tawanna Cooler MD, Tinnie Gens A    Headache    migraines in the past   History of pancreatitis    a. admx 04-2009.Marland KitchenMarland Kitchen? 2-2 triglycerides   Hypertension    Hypertriglyceridemia    a. followed by [facility]   Left sided sciatica 09/14/2012   Neuropathy    Obesity    OSA (obstructive sleep apnea)    CPAP- not current   Restless legs    Type 2 diabetes mellitus with diabetic neuropathy, unspecified (HCC) 09/20/2019    Allergies  Allergen Reactions   Diphenhydramine Palpitations and Other (See Comments)    "jittery" Pt states he can take Benadryl in an emergency situation   Beta Adrenergic Blockers Other (See Comments)    Erectile dysfxn   Colchicine Diarrhea   Lipitor [Atorvastatin] Other (See Comments)    Muscle cramps   Pecan Nut (Diagnostic) Palpitations    Pecans: itching and swelling of the tongue    Septra [Sulfamethoxazole-Trimethoprim] Itching     OBJECTIVE General Patient is awake, alert, and oriented x 3 and in no acute distress. Derm Skin is dry and supple bilateral. Negative open lesions or macerations. Remaining integument unremarkable. Nails are  tender, long, thickened and dystrophic with subungual debris, consistent with onychomycosis, 1-5 bilateral. No signs of infection noted. Vasc  DP and PT pedal pulses palpable bilaterally. Temperature gradient within normal limits.  Neuro Epicritic and protective threshold sensation diminished bilaterally.  Musculoskeletal Exam No symptomatic pedal deformities noted bilateral. Muscular strength within normal limits.  ASSESSMENT 1. Diabetes Mellitus w/ peripheral neuropathy 2.  Pain due to onychomycosis of toenails bilateral  PLAN OF CARE 1. Patient evaluated today. 2. Instructed to maintain good pedal hygiene and foot care. Stressed importance of controlling blood sugar.  3. Mechanical debridement of nails 1-5 bilaterally performed using a nail nipper. Filed with dremel without incident.  4. Return to clinic in 3 mos.     Felecia Shelling, DPM [facility]  Dr. Felecia Shelling, DPM    [address] [place], [place] [address]                Office [phone]  Fax [phone]

## 2023-01-11 DIAGNOSIS — M67911 Unspecified disorder of synovium and tendon, right shoulder: Secondary | ICD-10-CM | POA: Diagnosis not present

## 2023-01-16 ENCOUNTER — Encounter: Payer: Self-pay | Admitting: Internal Medicine

## 2023-01-16 NOTE — Telephone Encounter (Signed)
This appears to be a new pt - needs ROV any provider, or ED or UC for persistent bleeding and pain

## 2023-01-18 ENCOUNTER — Ambulatory Visit: Payer: Medicare HMO | Admitting: Internal Medicine

## 2023-01-18 DIAGNOSIS — M25512 Pain in left shoulder: Secondary | ICD-10-CM | POA: Diagnosis not present

## 2023-01-18 DIAGNOSIS — M1711 Unilateral primary osteoarthritis, right knee: Secondary | ICD-10-CM | POA: Diagnosis not present

## 2023-01-18 DIAGNOSIS — G894 Chronic pain syndrome: Secondary | ICD-10-CM | POA: Diagnosis not present

## 2023-01-18 DIAGNOSIS — Z9889 Other specified postprocedural states: Secondary | ICD-10-CM | POA: Diagnosis not present

## 2023-01-19 ENCOUNTER — Other Ambulatory Visit: Payer: Self-pay | Admitting: Internal Medicine

## 2023-01-19 ENCOUNTER — Ambulatory Visit (INDEPENDENT_AMBULATORY_CARE_PROVIDER_SITE_OTHER): Payer: Medicare HMO | Admitting: Internal Medicine

## 2023-01-19 ENCOUNTER — Encounter: Payer: Self-pay | Admitting: Internal Medicine

## 2023-01-19 VITALS — BP 144/82 | HR 55 | Temp 98.3°F | Ht 71.0 in | Wt 245.0 lb

## 2023-01-19 DIAGNOSIS — Z9889 Other specified postprocedural states: Secondary | ICD-10-CM | POA: Insufficient documentation

## 2023-01-19 DIAGNOSIS — L723 Sebaceous cyst: Secondary | ICD-10-CM

## 2023-01-19 DIAGNOSIS — I10 Essential (primary) hypertension: Secondary | ICD-10-CM

## 2023-01-19 DIAGNOSIS — R21 Rash and other nonspecific skin eruption: Secondary | ICD-10-CM | POA: Diagnosis not present

## 2023-01-19 DIAGNOSIS — Z23 Encounter for immunization: Secondary | ICD-10-CM

## 2023-01-19 DIAGNOSIS — T7840XA Allergy, unspecified, initial encounter: Secondary | ICD-10-CM

## 2023-01-19 DIAGNOSIS — R972 Elevated prostate specific antigen [PSA]: Secondary | ICD-10-CM

## 2023-01-19 DIAGNOSIS — J3089 Other allergic rhinitis: Secondary | ICD-10-CM | POA: Diagnosis not present

## 2023-01-19 DIAGNOSIS — Z7984 Long term (current) use of oral hypoglycemic drugs: Secondary | ICD-10-CM

## 2023-01-19 DIAGNOSIS — E1169 Type 2 diabetes mellitus with other specified complication: Secondary | ICD-10-CM | POA: Diagnosis not present

## 2023-01-19 DIAGNOSIS — J329 Chronic sinusitis, unspecified: Secondary | ICD-10-CM | POA: Diagnosis not present

## 2023-01-19 DIAGNOSIS — K921 Melena: Secondary | ICD-10-CM | POA: Diagnosis not present

## 2023-01-19 DIAGNOSIS — F17200 Nicotine dependence, unspecified, uncomplicated: Secondary | ICD-10-CM

## 2023-01-19 LAB — HEPATIC FUNCTION PANEL
ALT: 12 U/L (ref 0–53)
AST: 16 U/L (ref 0–37)
Albumin: 4.3 g/dL (ref 3.5–5.2)
Alkaline Phosphatase: 71 U/L (ref 39–117)
Bilirubin, Direct: 0.1 mg/dL (ref 0.0–0.3)
Total Bilirubin: 0.4 mg/dL (ref 0.2–1.2)
Total Protein: 6.6 g/dL (ref 6.0–8.3)

## 2023-01-19 LAB — BASIC METABOLIC PANEL
BUN: 10 mg/dL (ref 6–23)
CO2: 28 meq/L (ref 19–32)
Calcium: 9.6 mg/dL (ref 8.4–10.5)
Chloride: 102 meq/L (ref 96–112)
Creatinine, Ser: 1.03 mg/dL (ref 0.40–1.50)
GFR: 81.79 mL/min (ref >60.00)
Glucose, Bld: 84 mg/dL (ref 70–99)
Potassium: 4 meq/L (ref 3.5–5.1)
Sodium: 138 meq/L (ref 135–145)

## 2023-01-19 LAB — CBC WITH DIFFERENTIAL/PLATELET
Basophils Absolute: 0.1 10*3/uL (ref 0.0–0.1)
Basophils Relative: 0.8 % (ref 0.0–3.0)
Eosinophils Absolute: 0.3 10*3/uL (ref 0.0–0.7)
Eosinophils Relative: 3.2 % (ref 0.0–5.0)
HCT: 44.5 % (ref 39.0–52.0)
Hemoglobin: 14.3 g/dL (ref 13.0–17.0)
Lymphocytes Relative: 21.5 % (ref 12.0–46.0)
Lymphs Abs: 2.3 10*3/uL (ref 0.7–4.0)
MCHC: 32.1 g/dL (ref 30.0–36.0)
MCV: 95.9 fL (ref 78.0–100.0)
Monocytes Absolute: 1.1 10*3/uL — ABNORMAL HIGH (ref 0.1–1.0)
Monocytes Relative: 10.8 % (ref 3.0–12.0)
Neutro Abs: 6.7 10*3/uL (ref 1.4–7.7)
Neutrophils Relative %: 63.7 % (ref 43.0–77.0)
Platelets: 444 10*3/uL — ABNORMAL HIGH (ref 150.0–400.0)
RBC: 4.64 Mil/uL (ref 4.22–5.81)
RDW: 14.3 % (ref 11.5–15.5)
WBC: 10.6 10*3/uL — ABNORMAL HIGH (ref 4.0–10.5)

## 2023-01-19 LAB — IBC PANEL
Iron: 72 ug/dL (ref 42–165)
Saturation Ratios: 12.6 % — ABNORMAL LOW (ref 20.0–50.0)
TIBC: 569.8 ug/dL — ABNORMAL HIGH (ref 250.0–450.0)
Transferrin: 407 mg/dL — ABNORMAL HIGH (ref 212.0–360.0)

## 2023-01-19 LAB — PSA: PSA: 1.95 ng/mL (ref 0.10–4.00)

## 2023-01-19 LAB — HEMOGLOBIN A1C: Hgb A1c MFr Bld: 6.1 % (ref 4.6–6.5)

## 2023-01-19 LAB — FERRITIN: Ferritin: 18.8 ng/mL — ABNORMAL LOW (ref 22.0–322.0)

## 2023-01-19 MED ORDER — HYDROCORTISONE ACETATE 25 MG RE SUPP: 25.0000 mg | Freq: Two times a day (BID) | RECTAL | 1 refills | Status: DC

## 2023-01-19 MED ORDER — METHYLPREDNISOLONE ACETATE 80 MG/ML IJ SUSP
80.0000 mg | Freq: Once | INTRAMUSCULAR | Status: AC
Start: 2023-01-19 — End: 2023-01-19
  Administered 2023-01-19: 80 mg via INTRAMUSCULAR

## 2023-01-19 MED ORDER — AZITHROMYCIN 250 MG PO TABS: ORAL_TABLET | ORAL | 1 refills | Status: AC

## 2023-01-19 MED ORDER — POLYSACCHARIDE IRON COMPLEX 150 MG PO CAPS: 150.0000 mg | ORAL_CAPSULE | Freq: Every day | ORAL | 1 refills | Status: AC

## 2023-01-19 NOTE — Progress Notes (Signed)
Patient ID: Harold [name], male   DOB: Aug 21, 1965, 57 y.o.   MRN: [id number]        Chief Complaint: follow up hematochezia, allergies, sinusitis, htn, facial rash, left axillary sebaceous cyst, dm       HPI:  Harold [name] is a 57 y.o. male here with c/o acute onset 1 wk small volume BRBPR with anal pain, less blood as the week went on and none today.  Denies worsening reflux, abd pain, dysphagia, n/v, bowel change.  Last colonoscopy 2020, has hx of hemorrhoid and colon polyp and due for f/u colonoscopy 2025. BP controlled at home.   Here with 2-3 days acute onset fever, facial pain, pressure, headache, general weakness and malaise, and greenish d/c, with mild ST and cough, but pt denies chest pain, wheezing, increased sob or doe, orthopnea, PND, increased LE swelling, palpitations, dizziness or syncope.  Does have several wks ongoing nasal allergy symptoms with clearish congestion, itch and sneezing, without fever, pain, ST, cough, swelling or wheezing.  Does also have a darkened rash to bilateral facies, asking for derm referral.  Also has a enlarging seb cyst to left axilla now smells often and needs removed.  Still smoking, not ready to quit      Wt Readings from Last 3 Encounters:  01/19/23 245 lb (111.1 kg)  08/30/22 252 lb 12.8 oz (114.7 kg)  07/19/22 263 lb (119.3 kg)   BP Readings from Last 3 Encounters:  01/19/23 (!) 144/82  08/30/22 123/82  07/19/22 130/80         Past Medical History:  Diagnosis Date   Acute idiopathic pericarditis 04/08/2010   Qualifier: Diagnosis of  By: Huntley Dec, [name]     Acute sinusitis 06/04/2014   Anxiety    CHF (congestive heart failure) (HCC) 06/29/2010   was pericardititis not CHF   Chronic back pain    Constipation    DDD (degenerative disc disease), lumbar 09/14/2012   Depression    Erectile dysfunction 09/14/2012   GERD (gastroesophageal reflux disease)    ocassional   Gout, unspecified 05/01/2007   Qualifier: Diagnosis of  By: Tawanna Cooler MD,  Tinnie Gens A    Headache    migraines in the past   History of pancreatitis    a. admx 04-2009.Marland KitchenMarland Kitchen? 2-2 triglycerides   Hypertension    Hypertriglyceridemia    a. followed by [facility]   Left sided sciatica 09/14/2012   Neuropathy    Obesity    OSA (obstructive sleep apnea)    CPAP- not current   Restless legs    Type 2 diabetes mellitus with diabetic neuropathy, unspecified (HCC) 09/20/2019   Past Surgical History:  Procedure Laterality Date   ANTERIOR CERVICAL DECOMP/DISCECTOMY FUSION N/A 09/14/2020   Procedure: Anterior Cervical Decompression Fusion - Cervical three-Cervical four;  Surgeon: Donalee Citrin, MD;  Location: [facility] OR;  Service: Neurosurgery;  Laterality: N/A;   BACK SURGERY  2021   lumbar fusion   COLONOSCOPY     ELBOW SURGERY Left    KNEE SURGERY Right    x3 scopes   KNEE SURGERY Right    open menisicus repair   LUMBAR LAMINECTOMY/DECOMPRESSION MICRODISCECTOMY N/A 08/26/2015   Procedure: Lumbar Two-Sacral One  Laminectomy for decompression;  Surgeon: Loura Halt Ditty, MD;  Location: [facility];  Service: Neurosurgery;  Laterality: N/A;   pericardectomy  07/15/2010   Dr. Tyrone Sage   pleurx catheter placement  07/15/2010   Dr. Sharen Heck SURGERY  right and left shoulders- Rotator cuff repair   SHOULDER SURGERY Right 2023   TONSILLECTOMY AND ADENOIDECTOMY     one tonsil    reports that he has been smoking cigarettes. He has a 18 pack-year smoking history. He quit smokeless tobacco use about 3 years ago.  His smokeless tobacco use included snuff. He reports current alcohol use. He reports that he does not use drugs. family history includes Colon cancer (age of onset: 63) in his father; Diabetes in his father, mother, and sister; Heart disease (age of onset: 81) in his father; Hypertension in an other family member; Liver cancer in his paternal uncle; Pancreatitis in his father and paternal uncle; Stomach cancer in his maternal grandfather. Allergies   Allergen Reactions   Diphenhydramine Palpitations and Other (See Comments)    "jittery" Pt states he can take Benadryl in an emergency situation   Beta Adrenergic Blockers Other (See Comments)    Erectile dysfxn   Colchicine Diarrhea   Lipitor [Atorvastatin] Other (See Comments)    Muscle cramps   Pecan Nut (Diagnostic) Palpitations    Pecans: itching and swelling of the tongue    Septra [Sulfamethoxazole-Trimethoprim] Itching   Current Outpatient Medications on File Prior to Visit  Medication Sig Dispense Refill   amLODipine (NORVASC) 10 MG tablet Take 1 tablet (10 mg total) by mouth daily. 90 tablet 3   ascorbic acid (VITAMIN C) 500 MG tablet Take 500 mg by mouth daily.     aspirin EC 81 MG tablet Take 81 mg by mouth daily.     Blood Glucose Monitoring Suppl (ONE TOUCH ULTRA 2) w/Device KIT Use as directed daily E11.9 1 each 0   Cholecalciferol (VITAMIN D3 PO) Take 1 capsule by mouth daily.     citalopram (CELEXA) 40 MG tablet Take 1 tablet (40 mg total) by mouth daily. 90 tablet 3   clotrimazole-betamethasone (LOTRISONE) cream Apply 1 Application topically daily. 45 g 1   cyclobenzaprine (FLEXERIL) 10 MG tablet Take 1 tablet (10 mg total) by mouth 3 (three) times daily as needed for muscle spasms. 30 tablet 2   fenofibrate (TRICOR) 145 MG tablet Take 1 tablet (145 mg total) by mouth daily. TAKE 1 TABLET(145 MG) BY MOUTH DAILY 90 tablet 3   fluticasone (FLONASE) 50 MCG/ACT nasal spray Place 2 sprays into both nostrils daily. (Patient taking differently: Place 2 sprays into both nostrils daily as needed for allergies.) 16 g 6   gabapentin (NEURONTIN) 300 MG capsule Take 900 mg by mouth 3 (three) times daily.     glucose blood test strip Use as instructed once daily E119 100 each 12   hydrochlorothiazide (MICROZIDE) 12.5 MG capsule Take 1 capsule (12.5 mg total) by mouth daily. 90 capsule 3   Lancets MISC Use as directed daily E11.9 100 each 11   lisinopril (ZESTRIL) 40 MG tablet TAKE  1 TABLET(40 MG) BY MOUTH DAILY 90 tablet 3   loratadine (CLARITIN) 10 MG tablet TAKE 1 TABLET(10 MG) BY MOUTH DAILY AS NEEDED FOR ALLERGIES 90 tablet 3   Magnesium 500 MG TABS Take 500 mg by mouth daily.     oxyCODONE (ROXICODONE) 15 MG immediate release tablet Take 1 tablet (15 mg total) by mouth every 4 (four) hours as needed for severe pain. 30 tablet 0   PERCOCET 10-325 MG tablet Take 1 tablet by mouth every 4 (four) hours as needed for pain.     Phenylephrine-Acetaminophen (TYLENOL SINUS CONGESTION/PAIN PO) Take 2 tablets by mouth 3 (three) times  daily as needed (congestion).     Potassium Chloride ER 20 MEQ TBCR Take 1 tablet by mouth daily. 90 tablet 3   rosuvastatin (CRESTOR) 20 MG tablet Take 1 tablet (20 mg total) by mouth daily. TAKE 1 TABLET(20 MG) BY MOUTH DAILY 90 tablet 3   tadalafil (CIALIS) 20 MG tablet Take 0.5-1 tablets (10-20 mg total) by mouth every other day as needed for erectile dysfunction. 10 tablet 11   tamsulosin (FLOMAX) 0.4 MG CAPS capsule Take 1 capsule (0.4 mg total) by mouth daily. 90 capsule 3   tirzepatide (MOUNJARO) 5 MG/0.5ML Pen Inject 5 mg into the skin once a week. 6 mL 3   tiZANidine (ZANAFLEX) 4 MG tablet Take 4 mg by mouth 3 (three) times daily as needed.     tolterodine (DETROL LA) 4 MG 24 hr capsule Take 1 capsule (4 mg total) by mouth daily. 90 capsule 3   vitamin B-12 (CYANOCOBALAMIN) 1000 MCG tablet Take 1 tablet (1,000 mcg total) by mouth daily. 90 tablet 1   zinc gluconate 50 MG tablet Take 50 mg by mouth daily.     Aromatic Inhalants (VICKS VAPOINHALER IN) Inhale 1 puff into the lungs daily as needed (congestion). (Patient not taking: Reported on 08/30/2022)     aspirin-sod bicarb-citric acid (ALKA-SELTZER) 325 MG TBEF tablet Take 650 mg by mouth every 6 (six) hours as needed (indigestion). (Patient not taking: Reported on 08/30/2022)     cephALEXin (KEFLEX) 500 MG capsule Take 1 capsule (500 mg total) by mouth 3 (three) times daily. (Patient not  taking: Reported on 08/30/2022) 30 capsule 0   Continuous Blood Gluc Receiver (DEXCOM G7 RECEIVER) DEVI Use as directed once per day E11.9 (Patient not taking: Reported on 08/30/2022) 1 each 0   Continuous Blood Gluc Sensor (DEXCOM G7 SENSOR) MISC Use as directed once every 2 weeks E11.9 (Patient not taking: Reported on 08/30/2022) 6 each 3   metFORMIN (GLUCOPHAGE-XR) 500 MG 24 hr tablet TAKE 2 TABLETS BY MOUTH DAILY WITH BREAKFAST (Patient not taking: Reported on 08/30/2022) 180 tablet 3   No current facility-administered medications on file prior to visit.        ROS:  All others reviewed and negative.  Objective        PE:  BP (!) 144/82 (BP Location: Left Arm, Patient Position: Sitting, Cuff Size: Normal)   Pulse (!) 55   Temp 98.3 F (36.8 C) (Oral)   Ht 5\' 11"  (1.803 m)   Wt 245 lb (111.1 kg)   SpO2 99%   BMI 34.17 kg/m                 Constitutional: Pt appears in NAD               HENT: Head: NCAT.                Right Ear: External ear normal.                 Left Ear: External ear normal. Bilat tm's with mild erythema.  Max sinus areas mild tender.  Pharynx with mild erythema, no exudate               Eyes: . Pupils are equal, round, and reactive to light. Conjunctivae and EOM are normal               Nose: without d/c or deformity               Neck: Neck  supple. Gross normal ROM               Cardiovascular: Normal rate and regular rhythm.                 Pulmonary/Chest: Effort normal and breath sounds without rales or wheezing.                Abd:  Soft, NT, ND, + BS, no organomegaly               Neurological: Pt is alert. At baseline orientation, motor grossly intact               Skin: Skin is warm. Bilateral facies with darkened skin, LE edema - none; left axilla with 1.5 cm nontender sebac cyst               Psychiatric: Pt behavior is normal without agitation   Micro: none  Cardiac tracings I have personally interpreted today:  none  Pertinent Radiological findings  (summarize): none   Lab Results  Component Value Date   WBC 10.6 (H) 01/19/2023   HGB 14.3 01/19/2023   HCT 44.5 01/19/2023   PLT 444.0 (H) 01/19/2023   GLUCOSE 84 01/19/2023   CHOL 142 07/19/2022   TRIG 79.0 07/19/2022   HDL 40.70 07/19/2022   LDLDIRECT 124.0 04/05/2022   LDLCALC 85 07/19/2022   ALT 12 01/19/2023   AST 16 01/19/2023   NA 138 01/19/2023   K 4.0 01/19/2023   CL 102 01/19/2023   CREATININE 1.03 01/19/2023   BUN 10 01/19/2023   CO2 28 01/19/2023   TSH 0.50 04/05/2022   PSA 1.95 01/19/2023   INR 1.72 (H) 07/05/2010   HGBA1C 6.1 01/19/2023   MICROALBUR 3.1 (H) 04/05/2022   Assessment/Plan:  Harold [name] is a 57 y.o. Black or African American [2] male with  has a past medical history of Acute idiopathic pericarditis (04/08/2010), Acute sinusitis (06/04/2014), Anxiety, CHF (congestive heart failure) (HCC) (06/29/2010), Chronic back pain, Constipation, DDD (degenerative disc disease), lumbar (09/14/2012), Depression, Erectile dysfunction (09/14/2012), GERD (gastroesophageal reflux disease), Gout, unspecified (05/01/2007), Headache, History of pancreatitis, Hypertension, Hypertriglyceridemia, Left sided sciatica (09/14/2012), Neuropathy, Obesity, OSA (obstructive sleep apnea), Restless legs, and Type 2 diabetes mellitus with diabetic neuropathy, unspecified (HCC) (09/20/2019).  Type 2 diabetes mellitus (HCC) Lab Results  Component Value Date   HGBA1C 6.1 01/19/2023   Stable, pt to continue current medical treatment mounjaro 5 q wk, metformin ER 500 mg - 2 every day,    Essential hypertension BP Readings from Last 3 Encounters:  01/19/23 (!) 144/82  08/30/22 123/82  07/19/22 130/80   Uncontrolled, likely reactive, pt to continue medical treatment norvasc 10 every day, lisinopril 40 every day, hct 12.5 qd   Hematochezia With pain but none today, will need cbc refer GI as due for colonoscopy, anusol supp prn  Sebaceous cyst of axilla Also for refer general  surgury  Facial rash With bilateral darkened skin, for derm referral  Allergies Mild to mod, for depomedrol 80 mg IM, to f/u any worsening symptoms or concerns   Smoker Pt counsled to quit, pt not ready  Sinusitis Mild to mod, for antibx course zpack,  to f/u any worsening symptoms or concerns  Followup: Return in about 3 months (around 04/21/2023).  Oliver Barre, MD 01/22/2023 3:07 PM [facility] Internal Medicine

## 2023-01-19 NOTE — Patient Instructions (Addendum)
You had the steroid shot today  Please take all new medication as prescribed - the antibiotic  Please continue all other medications as before, and refills have been done if requested - the anusol [facility]  Please have the pharmacy call with any other refills you may need.  Please keep your appointments with your specialists as you may have planned  You will be contacted regarding the referral for: gastroenterology, dermatology, and general surgury  Please go to the LAB at the blood drawing area for the tests to be done  You will be contacted by phone if any changes need to be made immediately.  Otherwise, you will receive a letter about your results with an explanation, but please check with MyChart first.  Please make an Appointment to return in 3 months, or sooner if needed

## 2023-01-22 ENCOUNTER — Encounter: Payer: Self-pay | Admitting: Internal Medicine

## 2023-01-22 DIAGNOSIS — R21 Rash and other nonspecific skin eruption: Secondary | ICD-10-CM | POA: Insufficient documentation

## 2023-01-22 DIAGNOSIS — L723 Sebaceous cyst: Secondary | ICD-10-CM | POA: Insufficient documentation

## 2023-01-22 DIAGNOSIS — T7840XA Allergy, unspecified, initial encounter: Secondary | ICD-10-CM | POA: Insufficient documentation

## 2023-01-22 NOTE — Assessment & Plan Note (Signed)
With bilateral darkened skin, for derm referral

## 2023-01-22 NOTE — Assessment & Plan Note (Signed)
Lab Results  Component Value Date   HGBA1C 6.1 01/19/2023   Stable, pt to continue current medical treatment mounjaro 5 q wk, metformin ER 500 mg - 2 every day,

## 2023-01-22 NOTE — Assessment & Plan Note (Signed)
Mild to mod, for depomedrol 80 mg IM, to f/u any worsening symptoms or concerns 

## 2023-01-22 NOTE — Assessment & Plan Note (Signed)
Also for refer general surgury

## 2023-01-22 NOTE — Assessment & Plan Note (Signed)
Mild to mod, for antibx course zpack,  to f/u any worsening symptoms or concerns 

## 2023-01-22 NOTE — Assessment & Plan Note (Signed)
With pain but none today, will need cbc refer GI as due for colonoscopy, anusol supp prn

## 2023-01-22 NOTE — Assessment & Plan Note (Signed)
Pt counsled to quit, pt not ready °

## 2023-01-22 NOTE — Assessment & Plan Note (Signed)
BP Readings from Last 3 Encounters:  01/19/23 (!) 144/82  08/30/22 123/82  07/19/22 130/80   Uncontrolled, likely reactive, pt to continue medical treatment norvasc 10 every day, lisinopril 40 every day, hct 12.5 qd

## 2023-02-01 ENCOUNTER — Ambulatory Visit: Payer: Self-pay | Admitting: General Surgery

## 2023-02-01 DIAGNOSIS — L723 Sebaceous cyst: Secondary | ICD-10-CM | POA: Diagnosis not present

## 2023-02-07 ENCOUNTER — Encounter (HOSPITAL_BASED_OUTPATIENT_CLINIC_OR_DEPARTMENT_OTHER): Payer: Self-pay | Admitting: General Surgery

## 2023-02-07 ENCOUNTER — Other Ambulatory Visit: Payer: Self-pay

## 2023-02-15 ENCOUNTER — Ambulatory Visit (HOSPITAL_BASED_OUTPATIENT_CLINIC_OR_DEPARTMENT_OTHER): Payer: Medicare HMO | Admitting: Anesthesiology

## 2023-02-15 ENCOUNTER — Encounter (HOSPITAL_BASED_OUTPATIENT_CLINIC_OR_DEPARTMENT_OTHER): Payer: Self-pay | Admitting: General Surgery

## 2023-02-15 ENCOUNTER — Ambulatory Visit (HOSPITAL_BASED_OUTPATIENT_CLINIC_OR_DEPARTMENT_OTHER)
Admission: RE | Admit: 2023-02-15 | Discharge: 2023-02-15 | Disposition: A | Discharge status: Home / Self Care | Payer: Medicare HMO | Attending: General Surgery | Admitting: General Surgery

## 2023-02-15 ENCOUNTER — Other Ambulatory Visit: Payer: Self-pay

## 2023-02-15 ENCOUNTER — Encounter (HOSPITAL_BASED_OUTPATIENT_CLINIC_OR_DEPARTMENT_OTHER)
Admission: RE | Disposition: A | Discharge status: Home / Self Care | Payer: Self-pay | Source: Home / Self Care | Attending: General Surgery

## 2023-02-15 DIAGNOSIS — I509 Heart failure, unspecified: Secondary | ICD-10-CM | POA: Diagnosis not present

## 2023-02-15 DIAGNOSIS — L723 Sebaceous cyst: Secondary | ICD-10-CM

## 2023-02-15 DIAGNOSIS — E119 Type 2 diabetes mellitus without complications: Secondary | ICD-10-CM | POA: Diagnosis not present

## 2023-02-15 DIAGNOSIS — E669 Obesity, unspecified: Secondary | ICD-10-CM | POA: Diagnosis not present

## 2023-02-15 DIAGNOSIS — F418 Other specified anxiety disorders: Secondary | ICD-10-CM | POA: Insufficient documentation

## 2023-02-15 DIAGNOSIS — F17210 Nicotine dependence, cigarettes, uncomplicated: Secondary | ICD-10-CM | POA: Insufficient documentation

## 2023-02-15 DIAGNOSIS — G4730 Sleep apnea, unspecified: Secondary | ICD-10-CM | POA: Insufficient documentation

## 2023-02-15 DIAGNOSIS — I10 Essential (primary) hypertension: Secondary | ICD-10-CM | POA: Insufficient documentation

## 2023-02-15 DIAGNOSIS — L720 Epidermal cyst: Secondary | ICD-10-CM | POA: Diagnosis not present

## 2023-02-15 DIAGNOSIS — I110 Hypertensive heart disease with heart failure: Secondary | ICD-10-CM | POA: Diagnosis not present

## 2023-02-15 DIAGNOSIS — Z6834 Body mass index (BMI) 34.0-34.9, adult: Secondary | ICD-10-CM | POA: Insufficient documentation

## 2023-02-15 DIAGNOSIS — Z7984 Long term (current) use of oral hypoglycemic drugs: Secondary | ICD-10-CM | POA: Diagnosis not present

## 2023-02-15 DIAGNOSIS — E1140 Type 2 diabetes mellitus with diabetic neuropathy, unspecified: Secondary | ICD-10-CM

## 2023-02-15 DIAGNOSIS — Z7985 Long-term (current) use of injectable non-insulin antidiabetic drugs: Secondary | ICD-10-CM | POA: Insufficient documentation

## 2023-02-15 DIAGNOSIS — G4733 Obstructive sleep apnea (adult) (pediatric): Secondary | ICD-10-CM | POA: Diagnosis not present

## 2023-02-15 HISTORY — PX: ANAL INTRAEPITHELIAL NEOPLASIA EXCISION: SHX5241

## 2023-02-15 LAB — GLUCOSE, CAPILLARY
Glucose-Capillary: 84 mg/dL (ref 70–99)
Glucose-Capillary: 104 mg/dL — ABNORMAL HIGH (ref 70–99)

## 2023-02-15 SURGERY — EXCISION, NEOPLASM, INTRAEPITHELIAL, ANUS
Anesthesia: General | Site: Axilla | Laterality: Left

## 2023-02-15 MED ORDER — DEXAMETHASONE SODIUM PHOSPHATE 10 MG/ML IJ SOLN
INTRAMUSCULAR | Status: DC | PRN
  Administered 2023-02-15: 5 mg via INTRAVENOUS

## 2023-02-15 MED ORDER — ACETAMINOPHEN 500 MG PO TABS: 1000.0000 mg | ORAL_TABLET | Freq: Once | ORAL | Status: DC

## 2023-02-15 MED ORDER — CEFAZOLIN SODIUM-DEXTROSE 2-4 GM/100ML-% IV SOLN: 2.0000 g | INTRAVENOUS | Status: DC

## 2023-02-15 MED ORDER — GABAPENTIN 100 MG PO CAPS
ORAL_CAPSULE | ORAL | Status: AC
  Filled 2023-02-15: qty 1

## 2023-02-15 MED ORDER — FENTANYL CITRATE (PF) 100 MCG/2ML IJ SOLN
INTRAMUSCULAR | Status: DC | PRN
  Administered 2023-02-15: 100 ug via INTRAVENOUS

## 2023-02-15 MED ORDER — ONDANSETRON HCL 4 MG/2ML IJ SOLN
INTRAMUSCULAR | Status: DC | PRN
  Administered 2023-02-15: 4 mg via INTRAVENOUS

## 2023-02-15 MED ORDER — ACETAMINOPHEN 500 MG PO TABS: 1000.0000 mg | ORAL_TABLET | ORAL | Status: AC

## 2023-02-15 MED ORDER — FENTANYL CITRATE (PF) 100 MCG/2ML IJ SOLN
INTRAMUSCULAR | Status: AC
  Filled 2023-02-15: qty 2

## 2023-02-15 MED ORDER — ONDANSETRON HCL 4 MG/2ML IJ SOLN
INTRAMUSCULAR | Status: AC
  Filled 2023-02-15: qty 2

## 2023-02-15 MED ORDER — CHLORHEXIDINE GLUCONATE CLOTH 2 % EX PADS: 6.0000 | MEDICATED_PAD | Freq: Once | CUTANEOUS | Status: DC

## 2023-02-15 MED ORDER — GABAPENTIN 100 MG PO CAPS: 100.0000 mg | ORAL_CAPSULE | ORAL | Status: AC

## 2023-02-15 MED ORDER — CEFAZOLIN SODIUM-DEXTROSE 2-4 GM/100ML-% IV SOLN
INTRAVENOUS | Status: AC
  Filled 2023-02-15: qty 100

## 2023-02-15 MED ORDER — CEFAZOLIN SODIUM-DEXTROSE 2-3 GM-%(50ML) IV SOLR
INTRAVENOUS | Status: DC | PRN
  Administered 2023-02-15: 2 g via INTRAVENOUS

## 2023-02-15 MED ORDER — PROPOFOL 10 MG/ML IV BOLUS
INTRAVENOUS | Status: AC
  Filled 2023-02-15: qty 20

## 2023-02-15 MED ORDER — LIDOCAINE HCL (CARDIAC) PF 100 MG/5ML IV SOSY
PREFILLED_SYRINGE | INTRAVENOUS | Status: DC | PRN
  Administered 2023-02-15: 100 mg via INTRAVENOUS

## 2023-02-15 MED ORDER — LIDOCAINE 2% (20 MG/ML) 5 ML SYRINGE
INTRAMUSCULAR | Status: AC
  Filled 2023-02-15: qty 5

## 2023-02-15 MED ORDER — FENTANYL CITRATE (PF) 100 MCG/2ML IJ SOLN: 25.0000 ug | INTRAMUSCULAR | Status: DC | PRN

## 2023-02-15 MED ORDER — MIDAZOLAM HCL 2 MG/2ML IJ SOLN
INTRAMUSCULAR | Status: DC | PRN
  Administered 2023-02-15: 2 mg via INTRAVENOUS

## 2023-02-15 MED ORDER — AMISULPRIDE (ANTIEMETIC) 5 MG/2ML IV SOLN: 10.0000 mg | Freq: Once | INTRAVENOUS | Status: DC | PRN

## 2023-02-15 MED ORDER — LACTATED RINGERS IV SOLN: INTRAVENOUS | Status: DC

## 2023-02-15 MED ORDER — EPHEDRINE SULFATE (PRESSORS) 50 MG/ML IJ SOLN
INTRAMUSCULAR | Status: DC | PRN
  Administered 2023-02-15: 10 mg via INTRAVENOUS

## 2023-02-15 MED ORDER — ACETAMINOPHEN 500 MG PO TABS
ORAL_TABLET | ORAL | Status: AC
  Filled 2023-02-15: qty 2

## 2023-02-15 MED ORDER — OXYCODONE HCL 5 MG PO TABS: 5.0000 mg | ORAL_TABLET | Freq: Once | ORAL | Status: DC | PRN

## 2023-02-15 MED ORDER — OXYCODONE HCL 5 MG/5ML PO SOLN: 5.0000 mg | Freq: Once | ORAL | Status: DC | PRN

## 2023-02-15 MED ORDER — BUPIVACAINE-EPINEPHRINE 0.25% -1:200000 IJ SOLN
INTRAMUSCULAR | Status: DC | PRN
  Administered 2023-02-15: 20 mL

## 2023-02-15 MED ORDER — DEXAMETHASONE SODIUM PHOSPHATE 10 MG/ML IJ SOLN
INTRAMUSCULAR | Status: AC
  Filled 2023-02-15: qty 1

## 2023-02-15 MED ORDER — LACTATED RINGERS IV SOLN: INTRAVENOUS | Status: DC | PRN

## 2023-02-15 MED ORDER — PROPOFOL 10 MG/ML IV BOLUS
INTRAVENOUS | Status: DC | PRN
  Administered 2023-02-15: 200 mg via INTRAVENOUS

## 2023-02-15 MED ORDER — SODIUM CHLORIDE 0.9 % IV SOLN: 12.5000 mg | INTRAVENOUS | Status: DC | PRN

## 2023-02-15 MED ORDER — MIDAZOLAM HCL 2 MG/2ML IJ SOLN
INTRAMUSCULAR | Status: AC
  Filled 2023-02-15: qty 2

## 2023-02-15 SURGICAL SUPPLY — 42 items
BENZOIN TINCTURE PRP APPL 2/3 (GAUZE/BANDAGES/DRESSINGS) ×1 IMPLANT
BLADE SURG 10 STRL SS (BLADE) ×1 IMPLANT
BLADE SURG 15 STRL LF DISP TIS (BLADE) ×1 IMPLANT
CANISTER SUCT 1200ML W/VALVE (MISCELLANEOUS) IMPLANT
CHLORAPREP W/TINT 26 (MISCELLANEOUS) ×1 IMPLANT
CLEANER CAUTERY TIP PAD (MISCELLANEOUS) ×1 IMPLANT
COVER BACK TABLE 60X90IN (DRAPES) ×1 IMPLANT
COVER MAYO STAND STRL (DRAPES) ×1 IMPLANT
DERMABOND ADVANCED .7 DNX12 (GAUZE/BANDAGES/DRESSINGS) IMPLANT
DRAPE LAPAROTOMY 100X72 PEDS (DRAPES) ×1 IMPLANT
DRAPE UTILITY XL STRL (DRAPES) ×1 IMPLANT
DRSG TEGADERM 4X4.75 (GAUZE/BANDAGES/DRESSINGS) IMPLANT
ELECT REM PT RETURN 9FT ADLT (ELECTROSURGICAL) ×1
ELECTRODE REM PT RTRN 9FT ADLT (ELECTROSURGICAL) ×1 IMPLANT
GAUZE 4X4 16PLY ~~LOC~~+RFID DBL (SPONGE) IMPLANT
GAUZE SPONGE 2X2 STRL 8-PLY (GAUZE/BANDAGES/DRESSINGS) IMPLANT
GLOVE BIO SURGEON STRL SZ7.5 (GLOVE) ×1 IMPLANT
GOWN STRL REUS W/ TWL LRG LVL3 (GOWN DISPOSABLE) ×2 IMPLANT
NDL HYPO 25X1 1.5 SAFETY (NEEDLE) IMPLANT
NEEDLE HYPO 25X1 1.5 SAFETY (NEEDLE)
NS IRRIG 1000ML POUR BTL (IV SOLUTION) ×1 IMPLANT
PACK BASIN DAY SURGERY FS (CUSTOM PROCEDURE TRAY) ×1 IMPLANT
PENCIL SMOKE EVACUATOR (MISCELLANEOUS) ×1 IMPLANT
SPIKE FLUID TRANSFER (MISCELLANEOUS) IMPLANT
SPONGE T-LAP 18X18 ~~LOC~~+RFID (SPONGE) ×1 IMPLANT
STRIP CLOSURE SKIN 1/2X4 (GAUZE/BANDAGES/DRESSINGS) ×1 IMPLANT
STRIP CLOSURE SKIN 1/4X4 (GAUZE/BANDAGES/DRESSINGS) IMPLANT
SUT CHROMIC 3 0 SH 27 (SUTURE) IMPLANT
SUT ETHILON 3 0 PS 1 (SUTURE) IMPLANT
SUT MON AB 4-0 PC3 18 (SUTURE) IMPLANT
SUT PROLENE 3 0 PS 2 (SUTURE) IMPLANT
SUT SILK 2 0 PERMA HAND 18 BK (SUTURE) IMPLANT
SUT VIC AB 3-0 54X BRD REEL (SUTURE) IMPLANT
SUT VIC AB 3-0 FS2 27 (SUTURE) IMPLANT
SUT VIC AB 3-0 SH 27X BRD (SUTURE) IMPLANT
SUT VIC AB 4-0 PS2 18 (SUTURE) IMPLANT
SUT VIC AB 4-0 RB1 27X BRD (SUTURE) IMPLANT
SUT VICRYL AB 3 0 TIES (SUTURE) IMPLANT
SYR CONTROL 10ML LL (SYRINGE) IMPLANT
TOWEL GREEN STERILE FF (TOWEL DISPOSABLE) ×2 IMPLANT
TUBE CONNECTING 20X1/4 (TUBING) IMPLANT
YANKAUER SUCT BULB TIP NO VENT (SUCTIONS) IMPLANT

## 2023-02-15 NOTE — Discharge Instructions (Signed)

## 2023-02-15 NOTE — Op Note (Signed)
02/15/2023  2:00 PM  PATIENT:  Harold [name]  57 y.o. male  PRE-OPERATIVE DIAGNOSIS:  SEBACEOUS CYST LEFT AXILLA  POST-OPERATIVE DIAGNOSIS:  SEBACEOUS CYST LEFT AXILLA  PROCEDURE:  Procedure(s): EXCISION SEBACEOUS CYST LEFT AXILLA (Left)  SURGEON:  Surgeons and Role:    * Griselda Miner, MD - Primary  PHYSICIAN ASSISTANT:   ASSISTANTS: none   ANESTHESIA:   local and general  EBL:  minimal   BLOOD ADMINISTERED:none  DRAINS: none   LOCAL MEDICATIONS USED:  MARCAINE     SPECIMEN:  Source of Specimen:  cyst left axilla  DISPOSITION OF SPECIMEN:  PATHOLOGY  COUNTS:  YES  TOURNIQUET:  * No tourniquets in log *  DICTATION: .Dragon Dictation  After informed consent was obtained the patient was brought to the operating room and placed in the supine position on the operating table.  After adequate induction of general anesthesia the patient's left axilla was prepped with ChloraPrep, allowed to dry, and draped in usual sterile manner.  An appropriate timeout was performed.  The patient had a palpable cyst in the left axilla that measured approximately 2 x 1 cm.  The area around the cyst was infiltrated with quarter percent Marcaine until a good field block was created.  The cyst was then excised sharply with a 15 blade knife in an elliptical fashion down into the subcutaneous fatty tissue.  Once the entire cyst was removed it was then sent to pathology for further evaluation.  Hemostasis was achieved using the Bovie electrocautery.  The wound was then irrigated with saline and infiltrated with more quarter percent Marcaine.  The incision was then closed with interrupted 4-0 Monocryl subcuticular stitches.  Dermabond dressings were applied.  The patient tolerated the procedure well.  At the end of the case all needle sponge and instrument counts were correct.  The patient was then awakened and taken to recovery in stable condition.  PLAN OF CARE: Discharge to home after  PACU  PATIENT DISPOSITION:  PACU - hemodynamically stable.   Delay start of Pharmacological VTE agent (>24hrs) due to surgical blood loss or risk of bleeding: not applicable

## 2023-02-15 NOTE — Transfer of Care (Signed)
Immediate Anesthesia Transfer of Care Note  Patient: Harold [name]  Procedure(s) Performed: EXCISION SEBACEOUS CYST LEFT AXILLA (Left: Axilla)  Patient Location: PACU  Anesthesia Type:General  Level of Consciousness: awake, alert , and oriented  Airway & Oxygen Therapy: Patient Spontanous Breathing and Patient connected to face mask oxygen  Post-op Assessment: Report given to RN and Post -op Vital signs reviewed and stable  Post vital signs: Reviewed and stable  Last Vitals:  Vitals Value Taken Time  BP 116/74 02/15/23 1408  Temp    Pulse 60 02/15/23 1409  Resp 17 02/15/23 1409  SpO2 98 % 02/15/23 1409  Vitals shown include unfiled device data.  Last Pain:  Vitals:   02/15/23 1245  TempSrc: Temporal  PainSc: 0-No pain         Complications: No notable events documented.

## 2023-02-15 NOTE — Interval H&P Note (Signed)
History and Physical Interval Note:  02/15/2023 1:13 PM  Harold [name]  has presented today for surgery, with the diagnosis of SEBACEOUS CYST LEFT AXILLA.  The various methods of treatment have been discussed with the patient and family. After consideration of risks, benefits and other options for treatment, the patient has consented to  Procedure(s): EXCISION SEBACEOUS CYST LEFT AXILLA (Left) as a surgical intervention.  The patient's history has been reviewed, patient examined, no change in status, stable for surgery.  I have reviewed the patient's chart and labs.  Questions were answered to the patient's satisfaction.     Chevis Pretty III

## 2023-02-15 NOTE — Anesthesia Preprocedure Evaluation (Addendum)
Anesthesia Evaluation  Patient identified by MRN, date of birth, ID band Patient awake    Reviewed: Allergy & Precautions, NPO status , Patient's Chart, lab work & pertinent test results  History of Anesthesia Complications Negative for: history of anesthetic complications  Airway Mallampati: II  TM Distance: >3 FB Neck ROM: Full    Dental  (+) Edentulous Lower, Edentulous Upper   Pulmonary sleep apnea and Continuous Positive Airway Pressure Ventilation , Current SmokerPatient did not abstain from smoking.   Pulmonary exam normal        Cardiovascular hypertension, Pt. on medications Normal cardiovascular exam     Neuro/Psych  Headaches PSYCHIATRIC DISORDERS Anxiety Depression       GI/Hepatic Neg liver ROS,GERD  Controlled,,  Endo/Other  diabetes, Type 2, Oral Hypoglycemic Agents   Obesity   Renal/GU negative Renal ROS     Musculoskeletal  (+) Arthritis ,    Abdominal   Peds  Hematology negative hematology ROS (+)   Anesthesia Other Findings On GLP-1a, last dose over 1 week ago    Reproductive/Obstetrics                             Anesthesia Physical Anesthesia Plan  ASA: 3  Anesthesia Plan: General   Post-op Pain Management: Tylenol PO (pre-op)*   Induction: Intravenous  PONV Risk Score and Plan: 1 and Treatment may vary due to age or medical condition, Ondansetron, Dexamethasone and Midazolam  Airway Management Planned: LMA  Additional Equipment: None  Intra-op Plan:   Post-operative Plan: Extubation in OR  Informed Consent: I have reviewed the patients History and Physical, chart, labs and discussed the procedure including the risks, benefits and alternatives for the proposed anesthesia with the patient or authorized representative who has indicated his/her understanding and acceptance.     Dental advisory given  Plan Discussed with: CRNA and  Anesthesiologist  Anesthesia Plan Comments:         Anesthesia Quick Evaluation

## 2023-02-15 NOTE — H&P (Signed)
REFERRING PHYSICIAN: Corwin Levins, MD PROVIDER: Lindell Noe, MD MRN: [id number] DOB: 1965/04/19 Subjective   Chief Complaint: New Consultation (Sebaceous cyst of axilla)  History of Present Illness: Harold [name] is a 57 y.o. male who is seen today as an office consultation for evaluation of New Consultation (Sebaceous cyst of axilla)  We are asked to see the patient in consultation by Dr. Oliver Barre to evaluate him for a left axillary cyst. The patient is a 57 year old black male who has had this cyst in the left axilla for at least several months. He periodically will have it swell and drain. The last time it drained was about 2 weeks ago. He does smoke about a half a pack of cigarettes a day and does have some prediabetes.  Review of Systems: A complete review of systems was obtained from the patient. I have reviewed this information and discussed as appropriate with the patient. See HPI as well for other ROS.  ROS   Medical History: Past Medical History:  Diagnosis Date  Arthritis  Diabetes mellitus type 2, uncomplicated (CMS/HHS-HCC)  Hypertension  Sleep apnea   Patient Active Problem List  Diagnosis  Sebaceous cyst of left axilla   Past Surgical History:  Procedure Laterality Date  ARTHROSCOPIC ROTATOR CUFF REPAIR Bilateral  BACK SURGERY  ELBOW ARTHROSCOPY Left  KNEE ARTHROSCOPY  PERICARDIOTOMY    Allergies  Allergen Reactions  Benadryl [Diphenhydramine Hcl] Palpitations  Colchicine Diarrhea   Current Outpatient Medications on File Prior to Visit  Medication Sig Dispense Refill  amLODIPine (NORVASC) 10 MG tablet Take 10 mg by mouth once daily.  aspirin 81 MG chewable tablet  cephalexin (KEFLEX) 500 MG capsule Take 500 mg by mouth 3 (three) times daily  cholecalciferol, vitamin D3, (CHOLECALCIFEROL, VIT D3,,BULK,) 100,000 unit/gram Powd Take 1 capsule by mouth once daily  citalopram (CELEXA) 40 MG tablet Take 40 mg by mouth once daily.   clotrimazole-betamethasone (LOTRISONE) 1-0.05 % cream Apply topically  cyclobenzaprine (FLEXERIL) 10 MG tablet Take 10 mg by mouth 3 (three) times daily as needed  diclofenac (VOLTAREN) 75 MG EC tablet Take 75 mg by mouth 2 (two) times daily  fenofibrate nanocrystallized (TRICOR) 145 MG tablet Take 145 mg by mouth once daily.  fluticasone propionate (FLONASE) 50 mcg/actuation nasal spray Place 2 sprays into one nostril once daily  FUROsemide (LASIX) 40 MG tablet Take 40 mg by mouth once daily.  gabapentin (NEURONTIN) 300 MG capsule Take by mouth  hydroCHLOROthiazide (MICROZIDE) 12.5 mg capsule Take 12.5 mg by mouth once daily  lisinopril (PRINIVIL,ZESTRIL) 40 MG tablet Take 40 mg by mouth once daily.  metFORMIN (GLUCOPHAGE-XR) 500 MG XR tablet Take 2 tablets by mouth daily with breakfast  methocarbamol (ROBAXIN) 750 MG tablet Take 750 mg by mouth 4 (four) times daily.  MOUNJARO 5 mg/0.5 mL pen injector Inject subcutaneously  oxyCODONE-acetaminophen (PERCOCET) 10-325 mg tablet Take 1 tablet by mouth every 6 (six) hours as needed for Pain.  potassium chloride (MICRO-K) 10 MEQ ER capsule Take 10 mEq by mouth 2 (two) times daily.  rosuvastatin (CRESTOR) 10 MG tablet Take 10 mg by mouth once daily.  tamsulosin (FLOMAX) 0.4 mg capsule Take 0.4 mg by mouth once daily  tiZANidine (ZANAFLEX) 4 MG tablet Take 4 mg by mouth 3 (three) times daily as needed  tolterodine (DETROL LA) 4 MG LA capsule Take 4 mg by mouth once daily   No current facility-administered medications on file prior to visit.   Family History  Problem Relation Age  of Onset  Hyperlipidemia (Elevated cholesterol) Mother  Stroke Father  High blood pressure (Hypertension) Father  Diabetes Father  Colon cancer Father  Coronary Artery Disease (Blocked arteries around heart) Father  Hyperlipidemia (Elevated cholesterol) Father    Social History   Tobacco Use  Smoking Status Every Day  Current packs/day: 0.50  Types: Cigarettes   Smokeless Tobacco Not on file    Social History   Socioeconomic History  Marital status: Single  Tobacco Use  Smoking status: Every Day  Current packs/day: 0.50  Types: Cigarettes  Vaping Use  Vaping status: Unknown  Substance and Sexual Activity  Alcohol use: No  Drug use: No   Social Drivers of Corporate investment banker Strain: Medium Risk (08/07/2020)  Received from [facility]  Overall Financial Resource Strain (CARDIA)  Difficulty of Paying Living Expenses: Somewhat hard  Food Insecurity: No Food Insecurity (05/23/2022)  Received from [facility]  Hunger Vital Sign  Worried About Running Out of Food in the Last Year: Never true  Ran Out of Food in the Last Year: Never true  Transportation Needs: No Transportation Needs (05/23/2022)  Received from [facility] - Transportation  Lack of Transportation (Medical): No  Lack of Transportation (Non-Medical): No   Objective:   Vitals:  BP: (!) 144/88  Pulse: 59  Temp: 36.7 C (98.1 F)  SpO2: 97%  Weight: (!) 112.7 kg (248 lb 6.4 oz)  Height: 180.3 cm (5\' 11" )   Body mass index is 34.64 kg/m.  Physical Exam Constitutional:  General: He is not in acute distress. Appearance: Normal appearance.  HENT:  Head: Normocephalic and atraumatic.  Right Ear: External ear normal.  Left Ear: External ear normal.  Nose: Nose normal.  Mouth/Throat:  Mouth: Mucous membranes are moist.  Pharynx: Oropharynx is clear.  Eyes:  General: No scleral icterus. Extraocular Movements: Extraocular movements intact.  Conjunctiva/sclera: Conjunctivae normal.  Pupils: Pupils are equal, round, and reactive to light.  Cardiovascular:  Rate and Rhythm: Normal rate and regular rhythm.  Pulses: Normal pulses.  Heart sounds: Normal heart sounds.  Pulmonary:  Effort: Pulmonary effort is normal. No respiratory distress.  Breath sounds: Normal breath sounds.  Abdominal:  General: Abdomen is flat. Bowel sounds are normal. There is  no distension.  Palpations: Abdomen is soft.  Tenderness: There is no abdominal tenderness.  Musculoskeletal:  General: No swelling or deformity. Normal range of motion.  Cervical back: Normal range of motion and neck supple. No tenderness.  Skin: General: Skin is warm and dry.  Coloration: Skin is not jaundiced.  Comments: There is a 1.5 cm sebaceous cyst in the left axilla. There does not appear to be any acute infection.  Neurological:  General: No focal deficit present.  Mental Status: He is alert and oriented to person, place, and time.  Psychiatric:  Mood and Affect: Mood normal.  Behavior: Behavior normal.     Labs, Imaging and Diagnostic Testing:  Assessment and Plan:   Diagnoses and all orders for this visit:  Sebaceous cyst of left axilla - [facility] Case Posting Request; Future   The patient appears to have a sebaceous cyst in the left axilla that has periodically drained. Because of this he would like to have this removed and I feel like this is a very reasonable thing to do to prevent further infection. I have discussed with him in detail the risks and benefits of the operation as well as some of the technical aspects and he understands and wishes  to proceed. We will move forward with surgical scheduling.

## 2023-02-16 ENCOUNTER — Encounter (HOSPITAL_BASED_OUTPATIENT_CLINIC_OR_DEPARTMENT_OTHER): Payer: Self-pay | Admitting: General Surgery

## 2023-02-16 LAB — SURGICAL PATHOLOGY

## 2023-02-16 NOTE — Anesthesia Postprocedure Evaluation (Signed)
Anesthesia Post Note  Patient: Harold [name]  Procedure(s) Performed: EXCISION SEBACEOUS CYST LEFT AXILLA (Left: Axilla)     Patient location during evaluation: PACU Anesthesia Type: General Level of consciousness: awake and alert Pain management: pain level controlled Vital Signs Assessment: post-procedure vital signs reviewed and stable Respiratory status: spontaneous breathing, nonlabored ventilation and respiratory function stable Cardiovascular status: stable and blood pressure returned to baseline Anesthetic complications: no   No notable events documented.  Last Vitals:  Vitals:   02/15/23 1500 02/15/23 1516  BP: 126/73 (!) 141/84  Pulse: (!) 52 (!) 52  Resp: 19 18  Temp:  (!) 36.2 C  SpO2: 93% 95%    Last Pain:  Vitals:   02/16/23 0856  TempSrc:   PainSc: 4                  Beryle Lathe

## 2023-02-19 ENCOUNTER — Encounter: Payer: Self-pay | Admitting: Internal Medicine

## 2023-02-20 MED ORDER — TIRZEPATIDE 7.5 MG/0.5ML ~~LOC~~ SOAJ: 7.5000 mg | SUBCUTANEOUS | 3 refills | Status: DC

## 2023-02-28 ENCOUNTER — Ambulatory Visit: Payer: Medicare HMO | Admitting: Physician Assistant

## 2023-02-28 ENCOUNTER — Encounter: Payer: Self-pay | Admitting: Physician Assistant

## 2023-02-28 VITALS — BP 120/80 | HR 62 | Ht 71.0 in | Wt 246.0 lb

## 2023-02-28 DIAGNOSIS — Z791 Long term (current) use of non-steroidal anti-inflammatories (NSAID): Secondary | ICD-10-CM

## 2023-02-28 DIAGNOSIS — Z8711 Personal history of peptic ulcer disease: Secondary | ICD-10-CM

## 2023-02-28 DIAGNOSIS — Z800 Family history of malignant neoplasm of digestive organs: Secondary | ICD-10-CM

## 2023-02-28 DIAGNOSIS — K602 Anal fissure, unspecified: Secondary | ICD-10-CM

## 2023-02-28 DIAGNOSIS — K625 Hemorrhage of anus and rectum: Secondary | ICD-10-CM | POA: Diagnosis not present

## 2023-02-28 MED ORDER — NA SULFATE-K SULFATE-MG SULF 17.5-3.13-1.6 GM/177ML PO SOLN: 1.0000 | Freq: Once | ORAL | 0 refills | Status: AC

## 2023-02-28 MED ORDER — AMBULATORY NON FORMULARY MEDICATION: 2 refills | Status: AC

## 2023-02-28 MED ORDER — OMEPRAZOLE 40 MG PO CPDR: 40.0000 mg | DELAYED_RELEASE_CAPSULE | Freq: Every day | ORAL | 3 refills | Status: DC

## 2023-02-28 NOTE — Patient Instructions (Signed)
We have sent a prescription for Diltiazem 2% gel with Lidocaine 5% to [facility] for you. Using your index finger, you should apply a small amount of medication inside the rectum up to your first knuckle/joint 3-4 times daily x 6-8 weeks.  [facility]'s information is below: Address: [address] [place], [place] [address]  [phone]  *Please DO NOT go directly from our office to pick up this medication! Give the pharmacy 1 day to process the prescription as this is compounded and takes time to make.  We have sent the following medications to your pharmacy for you to pick up at your convenience: Omeprazole 40 mg daily 30-60 minutes before breakfast.   Decrease Ibuprofen use.   May use Miralax 1 capful daily in 8 ounces of liquid as needed.   May use a stool softener daily as needed.   You have been scheduled for a colonoscopy. Please follow written instructions given to you at your visit today.   Please pick up your prep supplies at the pharmacy within the next 1-3 days.  If you use inhalers (even only as needed), please bring them with you on the day of your procedure.  DO NOT TAKE 7 DAYS PRIOR TO TEST- Trulicity (dulaglutide) Ozempic, Wegovy (semaglutide) Mounjaro (tirzepatide) Bydureon Bcise (exanatide extended release)  DO NOT TAKE 1 DAY PRIOR TO YOUR TEST Rybelsus (semaglutide) Adlyxin (lixisenatide) Victoza (liraglutide) Byetta (exanatide) _________________________________________________________________  _______________________________________________________  If your blood pressure at your visit was 140/90 or greater, please contact your primary care physician to follow up on this.  _______________________________________________________  If you are age 1 or older, your body mass index should be between 23-30. Your Body mass index is 34.31 kg/m. If this is out of the aforementioned range listed, please consider follow up with your  Primary Care Provider.  If you are age 3 or younger, your body mass index should be between 19-25. Your Body mass index is 34.31 kg/m. If this is out of the aformentioned range listed, please consider follow up with your Primary Care Provider.   ________________________________________________________  The [facility] providers would like to encourage you to use [facility] to communicate with providers for non-urgent requests or questions.  Due to long hold times on the telephone, sending your provider a message by [facility] may be a faster and more efficient way to get a response.  Please allow 48 business hours for a response.  Please remember that this is for non-urgent requests.  _______________________________________________________

## 2023-02-28 NOTE — Progress Notes (Signed)
Subjective:    Patient ID: Harold [name], male    DOB: 30-Sep-1965, 57 y.o.   MRN: [id number]  HPI Harold "Franky Macho" is a 57 year old male, established with Dr. Rhea Belton currently referred back by Dr. Oliver Barre due to complaint of rectal bleeding, and anal rectal pain.  He had empirically been placed on hemorrhoidal suppository and cream. Patient says those symptoms had onset about 2 weeks ago, he had been somewhat constipated at that point and straining, developed anal pain and then saw bright red blood in the commode for couple of days.  He does not believe that he saw any blood mixed in with his bowel movements and did not see any melena.  The bleeding stopped and then recurred again this past weekend.  He does not really feel that the hemorrhoidal cream and suppository have made much difference and continues to complain of pain primarily with defecation. He has no current complaints of abdominal pain.  He has been using MiraLAX on a regular basis which has helped. Patient does have family history of colon cancer in his father. Also reports maternal grandfather had gastric cancer. Patient last had colonoscopy in February 2020 which was normal exam, no polyps and was not noted to have any internal hemorrhoids or external hemorrhoids at that time. He also had EGD and colonoscopy in 2013.  Colonoscopy at that time was unremarkable and at upper endoscopy he had esophageal candidiasis, several shallow gastric ulcers in antral gastritis. Urease was positive, duodenal biopsies were negative He was treated at that time but has not remained on long-term PPI. He says he has been having some intermittent epigastric pain recently and uses Alka-Seltzer and/or over-the-counter Zantac as needed. On further questioning he does have chronic back pain and is disabled due to back disease.  He usually takes three 800 mg ibuprofen daily as well as gabapentin and Percocet. He recently had initiated [facility] Other  medical problems include hypertension, adult onset diabetes mellitus, overactive bladder, sleep apnea with BiPAP use, cervical myelopathy ,lumbar disc disease and chronic back pain.  He also has history of idiopathic pericarditis, and required pericardiocentesis 2012  Review of Systems Pertinent positive and negative review of systems were noted in the above HPI section.  All other review of systems was otherwise negative.   Outpatient Encounter Medications as of 02/28/2023  Medication Sig   AMBULATORY NON FORMULARY MEDICATION Medication Name: Diltiazem 2% JYN:[id number] 5% - Using your index finger, you should apply a small amount of medication inside the rectum up to your first knuckle/joint 3-4 times daily x 6-8 weeks.   amLODipine (NORVASC) 10 MG tablet Take 1 tablet (10 mg total) by mouth daily.   Aromatic Inhalants (VICKS VAPOINHALER IN) Inhale 1 puff into the lungs daily as needed (congestion).   ascorbic acid (VITAMIN C) 500 MG tablet Take 500 mg by mouth daily.   aspirin EC 81 MG tablet Take 81 mg by mouth daily.   aspirin-sod bicarb-citric acid (ALKA-SELTZER) 325 MG TBEF tablet Take 650 mg by mouth every 6 (six) hours as needed (indigestion).   Blood Glucose Monitoring Suppl (ONE TOUCH ULTRA 2) w/Device KIT Use as directed daily E11.9   Cholecalciferol (VITAMIN D3 PO) Take 1 capsule by mouth daily.   citalopram (CELEXA) 40 MG tablet Take 1 tablet (40 mg total) by mouth daily.   clotrimazole-betamethasone (LOTRISONE) cream Apply 1 Application topically daily.   cyclobenzaprine (FLEXERIL) 10 MG tablet Take 1 tablet (10 mg total) by mouth 3 (three) times daily  as needed for muscle spasms.   fenofibrate (TRICOR) 145 MG tablet Take 1 tablet (145 mg total) by mouth daily. TAKE 1 TABLET(145 MG) BY MOUTH DAILY   fluticasone (FLONASE) 50 MCG/ACT nasal spray Place 2 sprays into both nostrils daily. (Patient taking differently: Place 2 sprays into both nostrils daily as needed for allergies.)    gabapentin (NEURONTIN) 300 MG capsule Take 900 mg by mouth 3 (three) times daily.   glucose blood test strip Use as instructed once daily E119   hydrochlorothiazide (MICROZIDE) 12.5 MG capsule Take 1 capsule (12.5 mg total) by mouth daily.   hydrocortisone (ANUSOL-HC) 25 MG suppository Place 1 suppository (25 mg total) rectally every 12 (twelve) hours.   iron polysaccharides (NU-IRON) 150 MG capsule Take 1 capsule (150 mg total) by mouth daily.   Lancets MISC Use as directed daily E11.9   lisinopril (ZESTRIL) 40 MG tablet TAKE 1 TABLET(40 MG) BY MOUTH DAILY   loratadine (CLARITIN) 10 MG tablet TAKE 1 TABLET(10 MG) BY MOUTH DAILY AS NEEDED FOR ALLERGIES   Magnesium 500 MG TABS Take 500 mg by mouth daily.   Na Sulfate-K Sulfate-Mg Sulf 17.5-3.13-1.6 GM/177ML SOLN Take 1 kit by mouth once for 1 dose.   omeprazole (PRILOSEC) 40 MG capsule Take 1 capsule (40 mg total) by mouth daily.   oxyCODONE (ROXICODONE) 15 MG immediate release tablet Take 1 tablet (15 mg total) by mouth every 4 (four) hours as needed for severe pain.   PERCOCET 10-325 MG tablet Take 1 tablet by mouth every 4 (four) hours as needed for pain.   Phenylephrine-Acetaminophen (TYLENOL SINUS CONGESTION/PAIN PO) Take 2 tablets by mouth 3 (three) times daily as needed (congestion).   Potassium Chloride ER 20 MEQ TBCR Take 1 tablet by mouth daily.   rosuvastatin (CRESTOR) 20 MG tablet Take 1 tablet (20 mg total) by mouth daily. TAKE 1 TABLET(20 MG) BY MOUTH DAILY   tadalafil (CIALIS) 20 MG tablet Take 0.5-1 tablets (10-20 mg total) by mouth every other day as needed for erectile dysfunction.   tamsulosin (FLOMAX) 0.4 MG CAPS capsule Take 1 capsule (0.4 mg total) by mouth daily.   tirzepatide (MOUNJARO) 7.5 MG/0.5ML Pen Inject 7.5 mg into the skin once a week.   tiZANidine (ZANAFLEX) 4 MG tablet Take 4 mg by mouth 3 (three) times daily as needed.   tolterodine (DETROL LA) 4 MG 24 hr capsule Take 1 capsule (4 mg total) by mouth daily.    vitamin B-12 (CYANOCOBALAMIN) 1000 MCG tablet Take 1 tablet (1,000 mcg total) by mouth daily.   zinc gluconate 50 MG tablet Take 50 mg by mouth daily.   [DISCONTINUED] cephALEXin (KEFLEX) 500 MG capsule Take 1 capsule (500 mg total) by mouth 3 (three) times daily.   Continuous Blood Gluc Receiver (DEXCOM G7 RECEIVER) DEVI Use as directed once per day E11.9 (Patient not taking: Reported on 02/28/2023)   Continuous Blood Gluc Sensor (DEXCOM G7 SENSOR) MISC Use as directed once every 2 weeks E11.9 (Patient not taking: Reported on 02/28/2023)   No facility-administered encounter medications on file as of 02/28/2023.   Allergies  Allergen Reactions   Diphenhydramine Palpitations and Other (See Comments)    "jittery" Pt states he can take Benadryl in an emergency situation   Beta Adrenergic Blockers Other (See Comments)    Erectile dysfxn   Colchicine Diarrhea   Lipitor [Atorvastatin] Other (See Comments)    Muscle cramps   Pecan Nut (Diagnostic) Palpitations    Pecans: itching and swelling of the tongue  Septra [Sulfamethoxazole-Trimethoprim] Itching   Patient Active Problem List   Diagnosis Date Noted   Facial rash 01/22/2023   Sebaceous cyst of axilla 01/22/2023   Allergies 01/22/2023   History of operative procedure on lumbosacral spinal structure 01/19/2023   Dysuria 07/19/2022   BPH (benign prostatic hyperplasia) 04/05/2022   Sinusitis 04/05/2022   Pseudoarthrosis of lumbar spine 06/11/2021   Opioid dependence (HCC) 05/27/2021   Nasal turbinate hypertrophy 05/27/2021   Lumbar pseudoarthrosis 05/27/2021   Infrapatellar bursitis of right knee 05/27/2021   Deviated nasal septum 05/27/2021   Arthritis of right knee 05/02/2021   Constipation 03/11/2021   Hematochezia 10/14/2020   OAB (overactive bladder) 10/12/2020   Hypokalemia 10/01/2020   Leucocytosis 10/01/2020   Cervical disc disease with myelopathy 09/30/2020   Upper extremity weakness 09/27/2020   Cervical myelopathy  (HCC) 09/27/2020   HNP (herniated nucleus pulposus) with myelopathy, cervical 09/14/2020   Cheilitis 07/04/2020   HNP (herniated nucleus pulposus), lumbar 01/08/2020   Type 2 diabetes mellitus with diabetic neuropathy, unspecified (HCC) 09/20/2019   Right knee pain 09/20/2019   Chronic low back pain 08/06/2019   Recurrent falls 08/06/2019   Educated about COVID-19 virus infection 07/11/2019   Lumbar post-laminectomy syndrome 04/10/2019   Allergic rhinitis 02/13/2018   Sinus congestion 12/19/2016   Thrush 08/02/2016   Lumbosacral spondylosis without myelopathy 07/05/2016   Sleep disturbance 07/04/2016   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   Itching of male genitalia 07/24/2015   Trichomonas exposure 07/24/2015   Type 2 diabetes mellitus (HCC) 03/04/2015   Verruca vulgaris 02/24/2015   Nocturia 02/17/2015   Accessory skin tags 02/17/2015   Acute sinusitis 06/04/2014   Lumbar radiculopathy, acute 05/23/2014   Acute left-sided back pain with sciatica 04/10/2014   Smoker 04/10/2014   Cough 01/01/2014   Grief reaction 11/19/2013   Primary localized osteoarthrosis, lower leg 09/04/2013   Chronic pain syndrome 07/11/2013   Hyperlipidemia 07/11/2013   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/09/2013   Gonalgia 11/29/2012   Encounter for well adult exam with abnormal findings 09/14/2012   DDD (degenerative disc disease), lumbar 09/14/2012   Left sided sciatica 09/14/2012   Erectile dysfunction 09/14/2012   Pre-ulcerative corn or callous 09/14/2012   Hemorrhoids 07/26/2011   Chronic back pain greater than 3 months duration 09/23/2010   Abnormal EKG 08/12/2010   OSA (obstructive sleep apnea) 08/09/2010   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   Edema 06/10/2010   Acute idiopathic pericarditis 04/08/2010   NONSPECIFIC ABNORMAL ELECTROCARDIOGRAM 04/08/2010   HYPERTRIGLYCERIDEMIA 05/26/2009   SHOULDER PAIN, LEFT 04/21/2008   Depression 03/11/2008   Essential hypertension 03/11/2008   METABOLIC  SYNDROME X 10/24/2006   OBESITY NOS 10/24/2006   Social History   Socioeconomic History   Marital status: Divorced    Spouse name: Not on file   Number of children: 1   Years of education: Not on file   Highest education level: Not on file  Occupational History   Occupation: disabled    Employer: UNEMPLOYED  Tobacco Use   Smoking status: Every Day    Current packs/day: 0.50    Average packs/day: 0.5 packs/day for 36.0 years (18.0 ttl pk-yrs)    Types: Cigarettes   Smokeless tobacco: Former    Types: Snuff    Quit date: 2021  Vaping Use   Vaping status: Some Days   Devices: Elfbar  Substance and Sexual Activity   Alcohol use: Yes    Comment: maybe 1/2 pint of liquor once a month, not every month   Drug use: No  Sexual activity: Yes  Other Topics Concern   Not on file  Social History Narrative   Regular exercise- No   Rare caffeine use    Social Determinants of Health   Financial Resource Strain: Medium Risk (08/07/2020)   Overall Financial Resource Strain (CARDIA)    Difficulty of Paying Living Expenses: Somewhat hard  Food Insecurity: No Food Insecurity (05/23/2022)   Hunger Vital Sign    Worried About Running Out of Food in the Last Year: Never true    Ran Out of Food in the Last Year: Never true  Transportation Needs: No Transportation Needs (05/23/2022)   PRAPARE - Administrator, Civil Service (Medical): No    Lack of Transportation (Non-Medical): No  Physical Activity: Not on file  Stress: Not on file  Social Connections: Not on file  Intimate Partner Violence: Not on file    Mr. Koskie family history includes Colon cancer (age of onset: 81) in his father; Diabetes in his father, mother, and sister; Heart disease (age of onset: 74) in his father; Hypertension in an other family member; Liver cancer in his paternal uncle; Pancreatitis in his father and paternal uncle; Stomach cancer in his maternal grandfather.      Objective:    Vitals:    02/28/23 1344  BP: 120/80  Pulse: 62  SpO2: 96%    Physical Exam Well-developed well-nourished older AA male  in no acute distress.  Height, WCBJSE,831  BMI34.3  HEENT; nontraumatic normocephalic, EOMI, PE R LA, sclera anicteric. Oropharynx;not donetoday Neck; supple, no JVD Cardiovascular; regular rate and rhythm with S1-S2, no murmur rub or gallop lower midline chest incisional scar Pulmonary; Clear bilaterally Abdomen; soft, obese , nontender, nondistended, no palpable mass or hepatosplenomegaly, bowel sounds are active Rectal; external skin tags present, on digital exam he is exquisitely tender on the right where there is a palpable anal fissure, I did not do a anoscopy Skin; benign exam, no jaundice rash or appreciable lesions Extremities; no clubbing cyanosis or edema skin warm and dry Neuro/Psych; alert and oriented x4, grossly nonfocal mood and affect appropriate        Assessment & Plan:   #30 57 year old African-American male with new onset of rectal bleeding about 2 weeks ago, right anal fissure on exam today likely cause of rectal bleeding.-Cannot rule out occult neoplasm  #2 family history of colon cancer-due for follow-up colonoscopy last done February 2020 and normal, no hemorrhoids noted at that time  #3 prior history of gastric ulcers-H. pylori positive and treated 2013, also was on NSAIDs at the time. Recent complaints of epigastric pain and continues on chronic NSAIDs-SPECT chronic gastritis and/or peptic ulcer disease  #4 adult onset diabetes mellitus-on Mounjaro #5.  Hypertension #6.  Remote history of idiopathic pericarditis 2012 #7.  Sleep apnea with BiPAP use no oxygen #8.  Chronic pain syndrome secondary to cervical and lumbar disc disease and myelopathy  Plan; patient will be scheduled for EGD and colonoscopy with Dr. Rhea Belton.  Both procedures were discussed in detail including indications risk and benefits and he is agreeable to proceed. For anal fissure  we discussed continued use of MiraLAX on a daily basis and a stool softener as needed to prevent constipation and/or hard stools. Stop hemorrhoidal suppositories/Preparation H Will send prescription for diltiazem 2% compounded with lidocaine 5% apply 1 inch into the anus 3-4 times daily over the next 4 to 6 weeks, continue use until symptoms resolve, refills included We discussed importance of trying to  decrease NSAID use Start omeprazole 40 mg p.o. every morning.  If he is going to need to stay on chronic NSAIDs he will benefit from taking this long-term, we will decide pending findings at EGD  Sammuel Cooper PA-C 02/28/2023   Cc: Corwin Levins, MD

## 2023-03-01 NOTE — Progress Notes (Signed)
Addendum: Reviewed and agree with assessment and management plan. Kadijah Shamoon M, MD  

## 2023-03-02 ENCOUNTER — Telehealth: Payer: Self-pay

## 2023-03-02 ENCOUNTER — Other Ambulatory Visit (HOSPITAL_COMMUNITY): Payer: Self-pay

## 2023-03-02 NOTE — Telephone Encounter (Signed)
Pharmacy Patient Advocate Encounter  Received notification from [facility] that Prior Authorization for [facility] G7 SENSOR has been APPROVED from 03/28/22 to 03/27/24. Ran test claim, Copay is $45.89. This test claim was processed through [facility]- copay amounts may vary at other pharmacies due to pharmacy/plan contracts, or as the patient moves through the different stages of their insurance plan.   PA #/Case ID/Reference #: [id number]

## 2023-03-03 ENCOUNTER — Telehealth: Payer: Self-pay

## 2023-03-06 ENCOUNTER — Other Ambulatory Visit (HOSPITAL_COMMUNITY): Payer: Self-pay

## 2023-03-06 ENCOUNTER — Telehealth: Payer: Self-pay

## 2023-03-06 NOTE — Telephone Encounter (Signed)
Pharmacy Patient Advocate Encounter   Received notification from Pt Calls Messages that prior authorization for Mounjaro 7.5mg /0.68ml is required/requested.   Insurance verification completed.   The patient is insured through [employer] .   Per test claim: PA required; PA submitted to above mentioned insurance via CoverMyMeds Key/confirmation #/EOC [id number] Status is pending

## 2023-03-06 NOTE — Telephone Encounter (Signed)
Pharmacy Patient Advocate Encounter  Received notification from [facility] that Prior Authorization for [facility] 7.5mg /0.29ml has been CANCELLED due to it is available without authorization.   Per test claim, refill too soon last filled 03/03/23 next fill 03/27/23.

## 2023-04-04 DIAGNOSIS — M5440 Lumbago with sciatica, unspecified side: Secondary | ICD-10-CM | POA: Diagnosis not present

## 2023-04-04 DIAGNOSIS — S32009K Unspecified fracture of unspecified lumbar vertebra, subsequent encounter for fracture with nonunion: Secondary | ICD-10-CM | POA: Diagnosis not present

## 2023-04-05 DIAGNOSIS — M25512 Pain in left shoulder: Secondary | ICD-10-CM | POA: Diagnosis not present

## 2023-04-05 DIAGNOSIS — Z9889 Other specified postprocedural states: Secondary | ICD-10-CM | POA: Diagnosis not present

## 2023-04-05 DIAGNOSIS — M5440 Lumbago with sciatica, unspecified side: Secondary | ICD-10-CM | POA: Diagnosis not present

## 2023-04-05 DIAGNOSIS — G894 Chronic pain syndrome: Secondary | ICD-10-CM | POA: Diagnosis not present

## 2023-04-12 ENCOUNTER — Ambulatory Visit: Payer: Medicare HMO | Admitting: Podiatry

## 2023-04-18 ENCOUNTER — Other Ambulatory Visit: Payer: Self-pay | Admitting: Neurosurgery

## 2023-04-18 DIAGNOSIS — S32009K Unspecified fracture of unspecified lumbar vertebra, subsequent encounter for fracture with nonunion: Secondary | ICD-10-CM

## 2023-04-19 ENCOUNTER — Encounter: Payer: Self-pay | Admitting: Podiatry

## 2023-04-19 ENCOUNTER — Ambulatory Visit (INDEPENDENT_AMBULATORY_CARE_PROVIDER_SITE_OTHER): Payer: Medicare HMO | Admitting: Podiatry

## 2023-04-19 VITALS — Ht 71.0 in | Wt 246.0 lb

## 2023-04-19 DIAGNOSIS — M7751 Other enthesopathy of right foot: Secondary | ICD-10-CM | POA: Diagnosis not present

## 2023-04-19 DIAGNOSIS — M7752 Other enthesopathy of left foot: Secondary | ICD-10-CM | POA: Diagnosis not present

## 2023-04-19 DIAGNOSIS — B351 Tinea unguium: Secondary | ICD-10-CM

## 2023-04-19 DIAGNOSIS — M79675 Pain in left toe(s): Secondary | ICD-10-CM | POA: Diagnosis not present

## 2023-04-19 DIAGNOSIS — M79674 Pain in right toe(s): Secondary | ICD-10-CM

## 2023-04-19 MED ORDER — BETAMETHASONE SOD PHOS & ACET 6 (3-3) MG/ML IJ SUSP
3.0000 mg | Freq: Once | INTRAMUSCULAR | Status: AC
  Administered 2023-04-19: 3 mg via INTRA_ARTICULAR

## 2023-04-19 NOTE — Addendum Note (Signed)
Addended by: Felecia Shelling on: 04/19/2023 03:35 PM   Modules accepted: Orders

## 2023-04-19 NOTE — Progress Notes (Addendum)
   Chief Complaint  Patient presents with   Nail Problem    Pt is here for [facility]    SUBJECTIVE Patient with a history of diabetes mellitus presents to office today complaining of elongated, thickened nails that cause pain while ambulating in shoes.  Patient is unable to trim their own nails.   Patient also states that the injection she received last visit to his first MTP bilateral helped significantly.  The pain is slowly returning.  This provided a few months of relief.  He is requesting injections today patient is here for further evaluation and treatment.   Past Medical History:  Diagnosis Date   Acute idiopathic pericarditis 04/08/2010   Qualifier: Diagnosis of  By: Huntley Dec, [name]     Acute sinusitis 06/04/2014   Anxiety    CHF (congestive heart failure) (HCC) 06/29/2010   was pericardititis not CHF   Chronic back pain    Constipation    DDD (degenerative disc disease), lumbar 09/14/2012   Depression    Erectile dysfunction 09/14/2012   GERD (gastroesophageal reflux disease)    ocassional   Gout, unspecified 05/01/2007   Qualifier: Diagnosis of  By: Tawanna Cooler MD, Tinnie Gens A    Headache    migraines in the past   History of pancreatitis    a. admx 04-2009.Marland KitchenMarland Kitchen? 2-2 triglycerides   Hypertension    Hypertriglyceridemia    a. followed by [facility]   Left sided sciatica 09/14/2012   Neuropathy    Obesity    OSA (obstructive sleep apnea)    CPAP- not current   Restless legs    Type 2 diabetes mellitus with diabetic neuropathy, unspecified (HCC) 09/20/2019    OBJECTIVE General Patient is awake, alert, and oriented x 3 and in no acute distress. Derm Skin is dry and supple bilateral. Negative open lesions or macerations. Remaining integument unremarkable. Nails are tender, long, thickened and dystrophic with subungual debris, consistent with onychomycosis, 1-5 bilateral. No signs of infection noted. Vasc  DP and PT pedal pulses palpable bilaterally. Temperature gradient within normal  limits.  Neuro Epicritic and protective threshold sensation diminished bilaterally.  Musculoskeletal Exam pain on palpation range of motion of the first MTP bilateral  ASSESSMENT 1. Diabetes Mellitus w/ peripheral neuropathy 2.  Pain due to onychomycosis of toenails bilateral 3.  Hallux limitus/MTP capsulitis bilateral  PLAN OF CARE 1. Patient evaluated today. 2. Instructed to maintain good pedal hygiene and foot care. Stressed importance of controlling blood sugar.  3. Mechanical debridement of nails 1-5 bilaterally performed using a nail nipper.  4.  Injection of 0.5 cc Celestone Soluspan injected in the first MTP bilateral  5.  Return to clinic in 3 mos. for routine foot care  *Goes by Posey Pronto, DPM [facility]  Dr. Felecia Shelling, DPM    [address] [place], [place] [address]                Office [phone]  Fax [phone]

## 2023-04-21 ENCOUNTER — Ambulatory Visit: Payer: Medicare HMO | Admitting: Internal Medicine

## 2023-04-27 ENCOUNTER — Encounter: Payer: Self-pay | Admitting: Internal Medicine

## 2023-04-27 ENCOUNTER — Ambulatory Visit: Payer: Medicare HMO | Admitting: Internal Medicine

## 2023-04-27 VITALS — BP 134/74 | HR 48 | Temp 98.1°F | Resp 18 | Ht 71.0 in | Wt 246.0 lb

## 2023-04-27 DIAGNOSIS — Z1211 Encounter for screening for malignant neoplasm of colon: Secondary | ICD-10-CM | POA: Diagnosis not present

## 2023-04-27 DIAGNOSIS — Z800 Family history of malignant neoplasm of digestive organs: Secondary | ICD-10-CM

## 2023-04-27 DIAGNOSIS — G4733 Obstructive sleep apnea (adult) (pediatric): Secondary | ICD-10-CM | POA: Diagnosis not present

## 2023-04-27 DIAGNOSIS — R1013 Epigastric pain: Secondary | ICD-10-CM

## 2023-04-27 DIAGNOSIS — K3189 Other diseases of stomach and duodenum: Secondary | ICD-10-CM | POA: Diagnosis not present

## 2023-04-27 DIAGNOSIS — K5730 Diverticulosis of large intestine without perforation or abscess without bleeding: Secondary | ICD-10-CM

## 2023-04-27 DIAGNOSIS — F419 Anxiety disorder, unspecified: Secondary | ICD-10-CM | POA: Diagnosis not present

## 2023-04-27 DIAGNOSIS — I509 Heart failure, unspecified: Secondary | ICD-10-CM | POA: Diagnosis not present

## 2023-04-27 DIAGNOSIS — K2970 Gastritis, unspecified, without bleeding: Secondary | ICD-10-CM | POA: Diagnosis not present

## 2023-04-27 DIAGNOSIS — E669 Obesity, unspecified: Secondary | ICD-10-CM | POA: Diagnosis not present

## 2023-04-27 DIAGNOSIS — K319 Disease of stomach and duodenum, unspecified: Secondary | ICD-10-CM | POA: Diagnosis not present

## 2023-04-27 MED ORDER — SODIUM CHLORIDE 0.9 % IV SOLN: 500.0000 mL | Freq: Once | INTRAVENOUS | Status: DC

## 2023-04-27 NOTE — Op Note (Signed)
[facility] Patient Name: Harold [name] Procedure Date: 04/27/2023 2:39 PM MRN: [id number] Endoscopist: Beverley Fiedler , MD, [id number] Age: 58 Referring MD:  Date of Birth: [birth year] Gender: Male Account #: [id number] Procedure:                Upper GI endoscopy Indications:              Epigastric abdominal pain Medicines:                Monitored Anesthesia Care Procedure:                Pre-Anesthesia Assessment:                           - Prior to the procedure, a History and Physical                            was performed, and patient medications and                            allergies were reviewed. The patient's tolerance of                            previous anesthesia was also reviewed. The risks                            and benefits of the procedure and the sedation                            options and risks were discussed with the patient.                            All questions were answered, and informed consent                            was obtained. Prior Anticoagulants: The patient has                            taken no anticoagulant or antiplatelet agents. ASA                            Grade Assessment: III - A patient with severe                            systemic disease. After reviewing the risks and                            benefits, the patient was deemed in satisfactory                            condition to undergo the procedure.                           After obtaining informed consent, the endoscope was  passed under direct vision. Throughout the                            procedure, the patient's blood pressure, pulse, and                            oxygen saturations were monitored continuously. The                            Olympus Scope [id number] was introduced through the                            mouth, and advanced to the second part of duodenum.                            The upper GI endoscopy was  accomplished without                            difficulty. The patient tolerated the procedure                            well. Scope In: Scope Out: Findings:                 The examined esophagus was normal.                           Mild inflammation characterized by erythema was                            found in the gastric antrum. Biopsies were taken                            with a cold forceps for Helicobacter pylori testing.                           The examined duodenum was normal. Complications:            No immediate complications. Estimated Blood Loss:     Estimated blood loss: none. Impression:               - Normal esophagus.                           - Mild gastritis. Biopsied to exclude H. Pylori                            infection.                           - Normal examined duodenum. Recommendation:           - Patient has a contact number available for                            emergencies. The signs and symptoms of potential  delayed complications were discussed with the                            patient. Return to normal activities tomorrow.                            Written discharge instructions were provided to the                            patient.                           - Resume previous diet.                           - Continue present medications.                           - Await pathology results. Beverley Fiedler, MD 04/27/2023 3:00:16 PM This report has been signed electronically.

## 2023-04-27 NOTE — Progress Notes (Signed)
Sedate, gd SR, tolerated procedure well, VSS, report to RN

## 2023-04-27 NOTE — Progress Notes (Signed)
Called to room to assist during endoscopic procedure.  Patient ID and intended procedure confirmed with present staff. Received instructions for my participation in the procedure from the performing physician.

## 2023-04-27 NOTE — Progress Notes (Signed)
Vitals-Harold [name]  Pt's states no medical or surgical changes since previsit or office visit.

## 2023-04-27 NOTE — Patient Instructions (Signed)
YOU HAD AN ENDOSCOPIC PROCEDURE TODAY AT THE [facility]:   Refer to the procedure report that was given to you for any specific questions about what was found during the examination.  If the procedure report does not answer your questions, please call your gastroenterologist to clarify.  If you requested that your care partner not be given the details of your procedure findings, then the procedure report has been included in a sealed envelope for you to review at your convenience later.  Educational handout provided to patient related to Gastritis and  Diverticulosis  Resume previous diet  Continue present medications  Awaiting pathology results   YOU SHOULD EXPECT: Some feelings of bloating in the abdomen. Passage of more gas than usual.  Walking can help get rid of the air that was put into your GI tract during the procedure and reduce the bloating. If you had a lower endoscopy (such as a colonoscopy or flexible sigmoidoscopy) you may notice spotting of blood in your stool or on the toilet paper. If you underwent a bowel prep for your procedure, you may not have a normal bowel movement for a few days.  Please Note:  You might notice some irritation and congestion in your nose or some drainage.  This is from the oxygen used during your procedure.  There is no need for concern and it should clear up in a day or so.  SYMPTOMS TO REPORT IMMEDIATELY:  Following lower endoscopy (colonoscopy or flexible sigmoidoscopy):  Excessive amounts of blood in the stool  Significant tenderness or worsening of abdominal pains  Swelling of the abdomen that is new, acute  Fever of 100F or higher  Following upper endoscopy (EGD)  Vomiting of blood or coffee ground material  New chest pain or pain under the shoulder blades  Painful or persistently difficult swallowing  New shortness of breath  Fever of 100F or higher  Black, tarry-looking stools  For urgent or emergent issues, a  gastroenterologist can be reached at any hour by calling [phone]. Do not use MyChart messaging for urgent concerns.    DIET:  We do recommend a small meal at first, but then you may proceed to your regular diet.  Drink plenty of fluids but you should avoid alcoholic beverages for 24 hours.  ACTIVITY:  You should plan to take it easy for the rest of today and you should NOT DRIVE or use heavy machinery until tomorrow (because of the sedation medicines used during the test).    FOLLOW UP: Our staff will call the number listed on your records the next business day following your procedure.  We will call around 7:15- 8:00 am to check on you and address any questions or concerns that you may have regarding the information given to you following your procedure. If we do not reach you, we will leave a message.     If any biopsies were taken you will be contacted by phone or by letter within the next 1-3 weeks.  Please call us at [phone] if you have not heard about the biopsies in 3 weeks.    SIGNATURES/CONFIDENTIALITY: You and/or your care partner have signed paperwork which will be entered into your electronic medical record.  These signatures attest to the fact that that the information above on your After Visit Summary has been reviewed and is understood.  Full responsibility of the confidentiality of this discharge information lies with you and/or your care-partner.

## 2023-04-27 NOTE — Progress Notes (Signed)
GASTROENTEROLOGY PROCEDURE H&P NOTE   Primary Care Physician: Corwin Levins, MD    Reason for Procedure:  Family history of colon cancer and epigastric pain  Plan:    EGD and colonoscopy  Patient is appropriate for endoscopic procedure(s) in the ambulatory ([facility]) setting.  The nature of the procedure, as well as the risks, benefits, and alternatives were carefully and thoroughly reviewed with the patient. Ample time for discussion and questions allowed. The patient understood, was satisfied, and agreed to proceed.     HPI: Harold [name] is a 58 y.o. male who presents for EGD and colonoscopy.  Medical history as below.  Tolerated the prep.  No recent chest pain or shortness of breath.  No abdominal pain today.  Past Medical History:  Diagnosis Date   Acute idiopathic pericarditis 04/08/2010   Qualifier: Diagnosis of  By: Huntley Dec, [name]     Acute sinusitis 06/04/2014   Anxiety    CHF (congestive heart failure) (HCC) 06/29/2010   was pericardititis not CHF   Chronic back pain    Constipation    DDD (degenerative disc disease), lumbar 09/14/2012   Depression    Erectile dysfunction 09/14/2012   GERD (gastroesophageal reflux disease)    ocassional   Gout, unspecified 05/01/2007   Qualifier: Diagnosis of  By: Tawanna Cooler MD, Tinnie Gens A    Headache    migraines in the past   History of pancreatitis    a. admx 04-2009.Marland KitchenMarland Kitchen? 2-2 triglycerides   Hypertension    Hypertriglyceridemia    a. followed by [facility]   Left sided sciatica 09/14/2012   Neuropathy    Obesity    OSA (obstructive sleep apnea)    CPAP- not current   Restless legs    Type 2 diabetes mellitus with diabetic neuropathy, unspecified (HCC) 09/20/2019    Past Surgical History:  Procedure Laterality Date   ANAL INTRAEPITHELIAL NEOPLASIA EXCISION Left 02/15/2023   Procedure: EXCISION SEBACEOUS CYST LEFT AXILLA;  Surgeon: Griselda Miner, MD;  Location: [facility];  Service: General;  Laterality: Left;    ANTERIOR CERVICAL DECOMP/DISCECTOMY FUSION N/A 09/14/2020   Procedure: Anterior Cervical Decompression Fusion - Cervical three-Cervical four;  Surgeon: Donalee Citrin, MD;  Location: [facility] OR;  Service: Neurosurgery;  Laterality: N/A;   BACK SURGERY  2021   lumbar fusion   COLONOSCOPY     ELBOW SURGERY Left    KNEE SURGERY Right    x3 scopes   KNEE SURGERY Right    open menisicus repair   LUMBAR LAMINECTOMY/DECOMPRESSION MICRODISCECTOMY N/A 08/26/2015   Procedure: Lumbar Two-Sacral One  Laminectomy for decompression;  Surgeon: Loura Halt Ditty, MD;  Location: [facility];  Service: Neurosurgery;  Laterality: N/A;   pericardectomy  07/15/2010   Dr. Tyrone Sage   pleurx catheter placement  07/15/2010   Dr. Tyrone Sage   SHOULDER SURGERY     right and left shoulders- Rotator cuff repair   SHOULDER SURGERY Right 2023   TONSILLECTOMY AND ADENOIDECTOMY     one tonsil    Prior to Admission medications   Medication Sig Start Date End Date Taking? Authorizing Provider  AMBULATORY NON FORMULARY MEDICATION Medication Name: Diltiazem 2% [id number] 5% - Using your index finger, you should apply a small amount of medication inside the rectum up to your first knuckle/joint 3-4 times daily x 6-8 weeks. 02/28/23   Esterwood, Amy S, PA-C  amLODipine (NORVASC) 10 MG tablet Take 1 tablet (10 mg total) by mouth daily. 04/05/22  Corwin Levins, MD  Aromatic Inhalants (VICKS VAPOINHALER IN) Inhale 1 puff into the lungs daily as needed (congestion).    [provider]  ascorbic acid (VITAMIN C) 500 MG tablet Take 500 mg by mouth daily.    [provider]  aspirin EC 81 MG tablet Take 81 mg by mouth daily.    [provider]  aspirin-sod bicarb-citric acid (ALKA-SELTZER) 325 MG TBEF tablet Take 650 mg by mouth every 6 (six) hours as needed (indigestion).    [provider]  Blood Glucose Monitoring Suppl (ONE TOUCH ULTRA 2) w/Device KIT Use as directed daily E11.9 08/22/18   Corwin Levins, MD  Cholecalciferol (VITAMIN D3 PO) Take 1 capsule by mouth daily.    [provider]  citalopram (CELEXA) 40 MG tablet Take 1 tablet (40 mg total) by mouth daily. 04/05/22   Corwin Levins, MD  clotrimazole-betamethasone (LOTRISONE) cream Apply 1 Application topically daily. 01/09/23   Felecia Shelling, DPM  Continuous Blood Gluc Receiver (DEXCOM G7 RECEIVER) DEVI Use as directed once per day E11.9 04/05/22   Corwin Levins, MD  Continuous Blood Gluc Sensor (DEXCOM G7 SENSOR) MISC Use as directed once every 2 weeks E11.9 04/05/22   Corwin Levins, MD  cyclobenzaprine (FLEXERIL) 10 MG tablet Take 1 tablet (10 mg total) by mouth 3 (three) times daily as needed for muscle spasms. 06/12/21   Jadene Pierini, MD  fenofibrate (TRICOR) 145 MG tablet Take 1 tablet (145 mg total) by mouth daily. TAKE 1 TABLET(145 MG) BY MOUTH DAILY 04/05/22   Corwin Levins, MD  fluticasone [facility]) 50 MCG/ACT nasal spray Place 2 sprays into both nostrils daily. Patient taking differently: Place 2 sprays into both nostrils daily as needed for allergies. 07/03/20   Corwin Levins, MD  gabapentin (NEURONTIN) 300 MG capsule Take 900 mg by mouth 3 (three) times daily.    [provider]  glucose blood test strip Use as instructed once daily E119 08/22/18   Corwin Levins, MD  hydrochlorothiazide (MICROZIDE) 12.5 MG capsule Take 1 capsule (12.5 mg total) by mouth daily. 04/05/22 04/05/23  Corwin Levins, MD  hydrocortisone (ANUSOL-HC) 25 MG suppository Place 1 suppository (25 mg total) rectally every 12 (twelve) hours. 01/19/23 01/19/24  Corwin Levins, MD  iron polysaccharides (NU-IRON) 150 MG capsule Take 1 capsule (150 mg total) by mouth daily. 01/19/23   Corwin Levins, MD  Lancets MISC Use as directed daily E11.9 08/22/18   Corwin Levins, MD  lisinopril (ZESTRIL) 40 MG tablet TAKE 1 TABLET(40 MG) BY MOUTH DAILY 04/05/22   Corwin Levins, MD  loratadine (CLARITIN) 10 MG tablet TAKE 1 TABLET(10 MG) BY MOUTH DAILY AS NEEDED  FOR ALLERGIES 07/06/22   Corwin Levins, MD  Magnesium 500 MG TABS Take 500 mg by mouth daily.    [provider]  omeprazole (PRILOSEC) 40 MG capsule Take 1 capsule (40 mg total) by mouth daily. 02/28/23   Esterwood, Amy S, PA-C  oxyCODONE (ROXICODONE) 15 MG immediate release tablet Take 1 tablet (15 mg total) by mouth every 4 (four) hours as needed for severe pain. 05/20/22   Donalee Citrin, MD  PERCOCET 10-325 MG tablet Take 1 tablet by mouth every 4 (four) hours as needed for pain. 08/17/22   [provider]  Phenylephrine-Acetaminophen (TYLENOL SINUS CONGESTION/PAIN PO) Take 2 tablets by mouth 3 (three) times daily as needed (congestion).    [provider]  Potassium Chloride  ER 20 MEQ TBCR Take 1 tablet by mouth daily. 04/05/22   Corwin Levins, MD  rosuvastatin (CRESTOR) 20 MG tablet Take 1 tablet (20 mg total) by mouth daily. TAKE 1 TABLET(20 MG) BY MOUTH DAILY 04/05/22   Corwin Levins, MD  tadalafil (CIALIS) 20 MG tablet Take 0.5-1 tablets (10-20 mg total) by mouth every other day as needed for erectile dysfunction. 04/05/22   Corwin Levins, MD  tamsulosin (FLOMAX) 0.4 MG CAPS capsule Take 1 capsule (0.4 mg total) by mouth daily. 04/05/22   Corwin Levins, MD  tirzepatide [facility]) 7.5 MG/0.5ML Pen Inject 7.5 mg into the skin once a week. 02/20/23   Corwin Levins, MD  tiZANidine (ZANAFLEX) 4 MG tablet Take 4 mg by mouth 3 (three) times daily as needed. 06/11/21   [provider]  tolterodine (DETROL LA) 4 MG 24 hr capsule Take 1 capsule (4 mg total) by mouth daily. 04/05/22   Corwin Levins, MD  vitamin B-12 (CYANOCOBALAMIN) 1000 MCG tablet Take 1 tablet (1,000 mcg total) by mouth daily. 08/08/19   Corwin Levins, MD  zinc gluconate 50 MG tablet Take 50 mg by mouth daily.    [provider]    Current Outpatient Medications  Medication Sig Dispense Refill   AMBULATORY NON FORMULARY MEDICATION Medication Name: Diltiazem 2% [id number] 5% - Using your index finger,  you should apply a small amount of medication inside the rectum up to your first knuckle/joint 3-4 times daily x 6-8 weeks. 45 g 2   amLODipine (NORVASC) 10 MG tablet Take 1 tablet (10 mg total) by mouth daily. 90 tablet 3   Aromatic Inhalants (VICKS VAPOINHALER IN) Inhale 1 puff into the lungs daily as needed (congestion).     ascorbic acid (VITAMIN C) 500 MG tablet Take 500 mg by mouth daily.     aspirin EC 81 MG tablet Take 81 mg by mouth daily.     aspirin-sod bicarb-citric acid (ALKA-SELTZER) 325 MG TBEF tablet Take 650 mg by mouth every 6 (six) hours as needed (indigestion).     Blood Glucose Monitoring Suppl (ONE TOUCH ULTRA 2) w/Device KIT Use as directed daily E11.9 1 each 0   Cholecalciferol (VITAMIN D3 PO) Take 1 capsule by mouth daily.     citalopram (CELEXA) 40 MG tablet Take 1 tablet (40 mg total) by mouth daily. 90 tablet 3   clotrimazole-betamethasone (LOTRISONE) cream Apply 1 Application topically daily. 45 g 1   Continuous Blood Gluc Receiver (DEXCOM G7 RECEIVER) DEVI Use as directed once per day E11.9 1 each 0   Continuous Blood Gluc Sensor (DEXCOM G7 SENSOR) MISC Use as directed once every 2 weeks E11.9 6 each 3   cyclobenzaprine (FLEXERIL) 10 MG tablet Take 1 tablet (10 mg total) by mouth 3 (three) times daily as needed for muscle spasms. 30 tablet 2   fenofibrate (TRICOR) 145 MG tablet Take 1 tablet (145 mg total) by mouth daily. TAKE 1 TABLET(145 MG) BY MOUTH DAILY 90 tablet 3   fluticasone (FLONASE) 50 MCG/ACT nasal spray Place 2 sprays into both nostrils daily. (Patient taking differently: Place 2 sprays into both nostrils daily as needed for allergies.) 16 g 6   gabapentin (NEURONTIN) 300 MG capsule Take 900 mg by mouth 3 (three) times daily.     glucose blood test strip Use as instructed once daily E119 100 each 12   hydrochlorothiazide (MICROZIDE) 12.5 MG capsule Take 1 capsule (12.5 mg total) by mouth daily. 90 capsule  3   hydrocortisone (ANUSOL-HC) 25 MG suppository  Place 1 suppository (25 mg total) rectally every 12 (twelve) hours. 12 suppository 1   iron polysaccharides (NU-IRON) 150 MG capsule Take 1 capsule (150 mg total) by mouth daily. 90 capsule 1   Lancets MISC Use as directed daily E11.9 100 each 11   lisinopril (ZESTRIL) 40 MG tablet TAKE 1 TABLET(40 MG) BY MOUTH DAILY 90 tablet 3   loratadine (CLARITIN) 10 MG tablet TAKE 1 TABLET(10 MG) BY MOUTH DAILY AS NEEDED FOR ALLERGIES 90 tablet 3   Magnesium 500 MG TABS Take 500 mg by mouth daily.     omeprazole (PRILOSEC) 40 MG capsule Take 1 capsule (40 mg total) by mouth daily. 90 capsule 3   oxyCODONE (ROXICODONE) 15 MG immediate release tablet Take 1 tablet (15 mg total) by mouth every 4 (four) hours as needed for severe pain. 30 tablet 0   PERCOCET 10-325 MG tablet Take 1 tablet by mouth every 4 (four) hours as needed for pain.     Phenylephrine-Acetaminophen (TYLENOL SINUS CONGESTION/PAIN PO) Take 2 tablets by mouth 3 (three) times daily as needed (congestion).     Potassium Chloride ER 20 MEQ TBCR Take 1 tablet by mouth daily. 90 tablet 3   rosuvastatin (CRESTOR) 20 MG tablet Take 1 tablet (20 mg total) by mouth daily. TAKE 1 TABLET(20 MG) BY MOUTH DAILY 90 tablet 3   tadalafil (CIALIS) 20 MG tablet Take 0.5-1 tablets (10-20 mg total) by mouth every other day as needed for erectile dysfunction. 10 tablet 11   tamsulosin (FLOMAX) 0.4 MG CAPS capsule Take 1 capsule (0.4 mg total) by mouth daily. 90 capsule 3   tirzepatide (MOUNJARO) 7.5 MG/0.5ML Pen Inject 7.5 mg into the skin once a week. 6 mL 3   tiZANidine (ZANAFLEX) 4 MG tablet Take 4 mg by mouth 3 (three) times daily as needed.     tolterodine (DETROL LA) 4 MG 24 hr capsule Take 1 capsule (4 mg total) by mouth daily. 90 capsule 3   vitamin B-12 (CYANOCOBALAMIN) 1000 MCG tablet Take 1 tablet (1,000 mcg total) by mouth daily. 90 tablet 1   zinc gluconate 50 MG tablet Take 50 mg by mouth daily.     Current Facility-Administered Medications   Medication Dose Route Frequency Provider Last Rate Last Admin   0.9 %  sodium chloride infusion  500 mL Intravenous Once Shelbie Franken, Carie Caddy, MD        Allergies as of 04/27/2023 - Review Complete 04/27/2023  Allergen Reaction Noted   Beta adrenergic blockers Other (See Comments) 04/05/2022   Diphenhydramine Palpitations and Other (See Comments) 06/20/2014   Colchicine Diarrhea 05/04/2007   Lipitor [atorvastatin] Other (See Comments) 10/04/2013   Pecan nut (diagnostic) Palpitations 08/07/2020   Septra [sulfamethoxazole-trimethoprim] Itching 10/28/2014    Family History  Problem Relation Age of Onset   Diabetes Mother    Diabetes Father    Colon cancer Father 65   Heart disease Father 76       CABG   Pancreatitis Father    Hypertension Other        entire family   Liver cancer Paternal Uncle    Diabetes Sister    Stomach cancer Maternal Grandfather    Pancreatitis Paternal Uncle        x 2   Esophageal cancer Neg Hx    Rectal cancer Neg Hx    Colon polyps Neg Hx     Social History   Socioeconomic History  Marital status: Divorced    Spouse name: Not on file   Number of children: 1   Years of education: Not on file   Highest education level: Not on file  Occupational History   Occupation: disabled    Employer: UNEMPLOYED  Tobacco Use   Smoking status: Every Day    Current packs/day: 0.50    Average packs/day: 0.5 packs/day for 36.0 years (18.0 ttl pk-yrs)    Types: Cigarettes   Smokeless tobacco: Former    Types: Snuff    Quit date: 2021  Vaping Use   Vaping status: Some Days   Devices: Elfbar  Substance and Sexual Activity   Alcohol use: Yes    Comment: maybe 1/2 pint of liquor once a month, not every month   Drug use: No   Sexual activity: Yes  Other Topics Concern   Not on file  Social History Narrative   Regular exercise- No   Rare caffeine use    Social Drivers of Corporate investment banker Strain: Medium Risk (08/07/2020)   Overall Financial  Resource Strain (CARDIA)    Difficulty of Paying Living Expenses: Somewhat hard  Food Insecurity: No Food Insecurity (05/23/2022)   Hunger Vital Sign    Worried About Running Out of Food in the Last Year: Never true    Ran Out of Food in the Last Year: Never true  Transportation Needs: No Transportation Needs (05/23/2022)   PRAPARE - Administrator, Civil Service (Medical): No    Lack of Transportation (Non-Medical): No  Physical Activity: Not on file  Stress: Not on file  Social Connections: Not on file  Intimate Partner Violence: Not on file    Physical Exam: Vital signs in last 24 hours: @BP  118/70 (BP Location: Right Arm, Patient Position: Sitting, Cuff Size: Normal)   Pulse (!) 53   Temp 98.1 F (36.7 C) (Temporal)   Ht 5\' 11"  (1.803 m)   Wt 246 lb (111.6 kg)   SpO2 98%   BMI 34.31 kg/m  GEN: NAD EYE: Sclerae anicteric ENT: MMM CV: Non-tachycardic Pulm: CTA b/l GI: Soft, NT/ND NEURO:  Alert & Oriented x 3   Erick Blinks, MD [facility]  04/27/2023 2:25 PM

## 2023-04-27 NOTE — Op Note (Signed)
[facility] Patient Name: Harold [name] Procedure Date: 04/27/2023 2:38 PM MRN: [id number] Endoscopist: Beverley Fiedler , MD, [id number] Age: 58 Referring MD:  Date of Birth: 1965-04-16 Gender: Male Account #: [id number] Procedure:                Colonoscopy Indications:              Screening in patient at increased risk: Family                            history of 1st-degree relative with colorectal                            cancer (father), Last colonoscopy: February 2020 Medicines:                Monitored Anesthesia Care Procedure:                Pre-Anesthesia Assessment:                           - Prior to the procedure, a History and Physical                            was performed, and patient medications and                            allergies were reviewed. The patient's tolerance of                            previous anesthesia was also reviewed. The risks                            and benefits of the procedure and the sedation                            options and risks were discussed with the patient.                            All questions were answered, and informed consent                            was obtained. Prior Anticoagulants: The patient has                            taken no anticoagulant or antiplatelet agents. ASA                            Grade Assessment: III - A patient with severe                            systemic disease. After reviewing the risks and                            benefits, the patient was deemed in satisfactory  condition to undergo the procedure.                           After obtaining informed consent, the colonoscope                            was passed under direct vision. Throughout the                            procedure, the patient's blood pressure, pulse, and                            oxygen saturations were monitored continuously. The                            Colonoscope was  introduced through the anus and                            advanced to the cecum, identified by appendiceal                            orifice and ileocecal valve. The colonoscopy was                            performed without difficulty. The patient tolerated                            the procedure well. The quality of the bowel                            preparation was good. The ileocecal valve,                            appendiceal orifice, and rectum were photographed. Scope In: 2:48:22 PM Scope Out: 2:57:23 PM Scope Withdrawal Time: 0 hours 7 minutes 46 seconds  Total Procedure Duration: 0 hours 9 minutes 1 second  Findings:                 The digital rectal exam was normal.                           A few small-mouthed diverticula were found in the                            ascending colon and cecum.                           The exam was otherwise without abnormality on                            direct and retroflexion views. Complications:            No immediate complications. Estimated Blood Loss:     Estimated blood loss: none. Impression:               - Mild diverticulosis in  the ascending colon and in                            the cecum.                           - The examination was otherwise normal on direct                            and retroflexion views.                           - No specimens collected. Recommendation:           - Patient has a contact number available for                            emergencies. The signs and symptoms of potential                            delayed complications were discussed with the                            patient. Return to normal activities tomorrow.                            Written discharge instructions were provided to the                            patient.                           - Resume previous diet.                           - Continue present medications.                           - Repeat  colonoscopy in 5 years for screening                            purposes. Beverley Fiedler, MD 04/27/2023 3:01:48 PM This report has been signed electronically.

## 2023-04-28 ENCOUNTER — Telehealth: Payer: Self-pay | Admitting: *Deleted

## 2023-04-28 NOTE — Telephone Encounter (Signed)
  Follow up Call-     04/27/2023    2:12 PM 04/27/2023    1:59 PM  Call back number  Post procedure Call Back phone  # [phone]   Permission to leave phone message  Yes     Patient questions:  Mailbox is full.

## 2023-05-02 LAB — SURGICAL PATHOLOGY

## 2023-05-03 ENCOUNTER — Encounter: Payer: Self-pay | Admitting: Internal Medicine

## 2023-05-05 ENCOUNTER — Ambulatory Visit (INDEPENDENT_AMBULATORY_CARE_PROVIDER_SITE_OTHER): Payer: Medicare HMO | Admitting: Internal Medicine

## 2023-05-05 ENCOUNTER — Encounter: Payer: Self-pay | Admitting: Internal Medicine

## 2023-05-05 VITALS — BP 136/84 | HR 50 | Temp 98.5°F | Ht 71.0 in | Wt 248.0 lb

## 2023-05-05 DIAGNOSIS — T7840XA Allergy, unspecified, initial encounter: Secondary | ICD-10-CM | POA: Diagnosis not present

## 2023-05-05 DIAGNOSIS — E785 Hyperlipidemia, unspecified: Secondary | ICD-10-CM | POA: Diagnosis not present

## 2023-05-05 DIAGNOSIS — E1169 Type 2 diabetes mellitus with other specified complication: Secondary | ICD-10-CM | POA: Diagnosis not present

## 2023-05-05 DIAGNOSIS — E559 Vitamin D deficiency, unspecified: Secondary | ICD-10-CM | POA: Diagnosis not present

## 2023-05-05 DIAGNOSIS — J309 Allergic rhinitis, unspecified: Secondary | ICD-10-CM | POA: Diagnosis not present

## 2023-05-05 DIAGNOSIS — I10 Essential (primary) hypertension: Secondary | ICD-10-CM

## 2023-05-05 DIAGNOSIS — Z0001 Encounter for general adult medical examination with abnormal findings: Secondary | ICD-10-CM | POA: Diagnosis not present

## 2023-05-05 DIAGNOSIS — Z7985 Long-term (current) use of injectable non-insulin antidiabetic drugs: Secondary | ICD-10-CM | POA: Diagnosis not present

## 2023-05-05 DIAGNOSIS — R972 Elevated prostate specific antigen [PSA]: Secondary | ICD-10-CM

## 2023-05-05 LAB — URINALYSIS, ROUTINE W REFLEX MICROSCOPIC
Bilirubin Urine: NEGATIVE
Hgb urine dipstick: NEGATIVE
Ketones, ur: NEGATIVE
Nitrite: NEGATIVE
Specific Gravity, Urine: 1.015 (ref 1.000–1.030)
Total Protein, Urine: NEGATIVE
Urine Glucose: NEGATIVE
Urobilinogen, UA: 0.2 (ref 0.0–1.0)
pH: 6 (ref 5.0–8.0)

## 2023-05-05 LAB — LIPID PANEL
Cholesterol: 122 mg/dL (ref 0–200)
HDL: 33.8 mg/dL — ABNORMAL LOW (ref >39.00)
LDL Cholesterol: 62 mg/dL (ref 0–99)
NonHDL: 87.96
Total CHOL/HDL Ratio: 4
Triglycerides: 131 mg/dL (ref 0.0–149.0)
VLDL: 26.2 mg/dL (ref 0.0–40.0)

## 2023-05-05 LAB — HEPATIC FUNCTION PANEL
ALT: 15 U/L (ref 0–53)
AST: 18 U/L (ref 0–37)
Albumin: 4.4 g/dL (ref 3.5–5.2)
Alkaline Phosphatase: 63 U/L (ref 39–117)
Bilirubin, Direct: 0.2 mg/dL (ref 0.0–0.3)
Total Bilirubin: 0.5 mg/dL (ref 0.2–1.2)
Total Protein: 6.7 g/dL (ref 6.0–8.3)

## 2023-05-05 LAB — HEMOGLOBIN A1C: Hgb A1c MFr Bld: 6.1 % (ref 4.6–6.5)

## 2023-05-05 LAB — BASIC METABOLIC PANEL
BUN: 13 mg/dL (ref 6–23)
CO2: 26 meq/L (ref 19–32)
Calcium: 9.5 mg/dL (ref 8.4–10.5)
Chloride: 102 meq/L (ref 96–112)
Creatinine, Ser: 1.46 mg/dL (ref 0.40–1.50)
GFR: 53.7 mL/min — ABNORMAL LOW (ref >60.00)
Glucose, Bld: 75 mg/dL (ref 70–99)
Potassium: 4.2 meq/L (ref 3.5–5.1)
Sodium: 140 meq/L (ref 135–145)

## 2023-05-05 LAB — CBC WITH DIFFERENTIAL/PLATELET
Basophils Absolute: 0.1 10*3/uL (ref 0.0–0.1)
Basophils Relative: 0.8 % (ref 0.0–3.0)
Eosinophils Absolute: 0.2 10*3/uL (ref 0.0–0.7)
Eosinophils Relative: 1.8 % (ref 0.0–5.0)
HCT: 45.5 % (ref 39.0–52.0)
Hemoglobin: 14.7 g/dL (ref 13.0–17.0)
Lymphocytes Relative: 21.6 % (ref 12.0–46.0)
Lymphs Abs: 2.5 10*3/uL (ref 0.7–4.0)
MCHC: 32.2 g/dL (ref 30.0–36.0)
MCV: 97.3 fL (ref 78.0–100.0)
Monocytes Absolute: 1.4 10*3/uL — ABNORMAL HIGH (ref 0.1–1.0)
Monocytes Relative: 12.2 % — ABNORMAL HIGH (ref 3.0–12.0)
Neutro Abs: 7.4 10*3/uL (ref 1.4–7.7)
Neutrophils Relative %: 63.6 % (ref 43.0–77.0)
Platelets: 385 10*3/uL (ref 150.0–400.0)
RBC: 4.67 Mil/uL (ref 4.22–5.81)
RDW: 14.6 % (ref 11.5–15.5)
WBC: 11.7 10*3/uL — ABNORMAL HIGH (ref 4.0–10.5)

## 2023-05-05 LAB — TSH: TSH: 0.47 u[IU]/mL (ref 0.35–5.50)

## 2023-05-05 LAB — PSA: PSA: 2.36 ng/mL (ref 0.10–4.00)

## 2023-05-05 LAB — VITAMIN D 25 HYDROXY (VIT D DEFICIENCY, FRACTURES): VITD: 19.05 ng/mL — ABNORMAL LOW (ref 30.00–100.00)

## 2023-05-05 MED ORDER — METHYLPREDNISOLONE ACETATE 80 MG/ML IJ SUSP
80.0000 mg | Freq: Once | INTRAMUSCULAR | Status: AC
  Administered 2023-05-05: 80 mg via INTRAMUSCULAR

## 2023-05-05 MED ORDER — TIRZEPATIDE 10 MG/0.5ML ~~LOC~~ SOAJ
10.0000 mg | SUBCUTANEOUS | 5 refills | Status: DC
Start: 2023-05-05 — End: 2023-11-15

## 2023-05-05 NOTE — Progress Notes (Signed)
 Patient ID: Harold [name], male   DOB: May 26, 1965, 59 y.o.   MRN: [id number]         Chief Complaint:: wellness exam and dm, allergy, htn, hld       HPI:  Harold [name] is a 58 y.o. male here for wellness exam; declines covid booster, for shingrix at the pharmacy, o/w up to date; still smoking not ready to quit                        Also Does have several wks ongoing nasal allergy symptoms with clearish congestion, itch and sneezing, without fever, pain, ST, cough, swelling or wheezing.  Pt denies chest pain, increased sob or doe, wheezing, orthopnea, PND, increased LE swelling, palpitations, dizziness or syncope.   Pt denies polydipsia, polyuria, or new focal neuro s/s.    Pt denies fever, wt loss, night sweats, loss of appetite, or other constitutional symptoms     Wt Readings from Last 3 Encounters:  05/05/23 248 lb (112.5 kg)  04/27/23 246 lb (111.6 kg)  04/19/23 246 lb (111.6 kg)   BP Readings from Last 3 Encounters:  05/05/23 136/84  04/27/23 134/74  02/28/23 120/80   Immunization History  Administered Date(s) Administered   Hepatitis B 03/29/2003, 06/27/2003, 09/26/2003   Influenza Split 01/27/2011   Influenza, Seasonal, Injecte, Preservative Fre 01/19/2023   Influenza,inj,Quad PF,6+ Mos 03/08/2013, 11/19/2013, 01/21/2015, 12/20/2016, 01/26/2018, 01/07/2019   Influenza-Unspecified 11/21/2018, 12/26/2019   PFIZER(Purple Top)SARS-COV-2 Vaccination 06/08/2019, 06/29/2019   PNEUMOCOCCAL CONJUGATE-20 10/12/2020   Pneumococcal Polysaccharide-23 03/24/2010, 03/04/2015   Td 03/28/2001   Tdap 09/14/2012, 10/05/2017   Health Maintenance Due  Topic Date Due   Medicare Annual Wellness (AWV)  Never done   Zoster Vaccines- Shingrix (1 of 2) Never done   COVID-19 Vaccine (3 - Pfizer risk series) 07/27/2019   Diabetic kidney evaluation - Urine ACR  04/06/2023      Past Medical History:  Diagnosis Date   Acute idiopathic pericarditis 04/08/2010   Qualifier: Diagnosis of  By:  Lelon RIGGERS, [name]     Acute sinusitis 06/04/2014   Anxiety    CHF (congestive heart failure) (HCC) 06/29/2010   was pericardititis not CHF   Chronic back pain    Constipation    DDD (degenerative disc disease), lumbar 09/14/2012   Depression    Erectile dysfunction 09/14/2012   GERD (gastroesophageal reflux disease)    ocassional   Gout, unspecified 05/01/2007   Qualifier: Diagnosis of  By: Krystal MD, Reyes A    Headache    migraines in the past   History of pancreatitis    a. admx 04-2009.SABRASABRA? 2-2 triglycerides   Hypertension    Hypertriglyceridemia    a. followed by [facility]   Left sided sciatica 09/14/2012   Neuropathy    Obesity    OSA (obstructive sleep apnea)    CPAP- not current   Restless legs    Type 2 diabetes mellitus with diabetic neuropathy, unspecified (HCC) 09/20/2019   Past Surgical History:  Procedure Laterality Date   ANAL INTRAEPITHELIAL NEOPLASIA EXCISION Left 02/15/2023   Procedure: EXCISION SEBACEOUS CYST LEFT AXILLA;  Surgeon: Curvin Deward MOULD, MD;  Location: [facility];  Service: General;  Laterality: Left;   ANTERIOR CERVICAL DECOMP/DISCECTOMY FUSION N/A 09/14/2020   Procedure: Anterior Cervical Decompression Fusion - Cervical three-Cervical four;  Surgeon: [name], MD;  Location: [facility];  Service: Neurosurgery;  Laterality: N/A;   BACK SURGERY  2021  lumbar fusion   COLONOSCOPY     ELBOW SURGERY Left    KNEE SURGERY Right    x3 scopes   KNEE SURGERY Right    open menisicus repair   LUMBAR LAMINECTOMY/DECOMPRESSION MICRODISCECTOMY N/A 08/26/2015   Procedure: Lumbar Two-Sacral One  Laminectomy for decompression;  Surgeon: Morene Hicks Ditty, MD;  Location: [facility];  Service: Neurosurgery;  Laterality: N/A;   pericardectomy  07/15/2010   Dr. Army   pleurx catheter placement  07/15/2010   Dr. Army   SHOULDER SURGERY     right and left shoulders- Rotator cuff repair   SHOULDER SURGERY Right 2023   TONSILLECTOMY AND  ADENOIDECTOMY     one tonsil    reports that he has been smoking cigarettes. He has a 18 pack-year smoking history. He quit smokeless tobacco use about 4 years ago.  His smokeless tobacco use included snuff. He reports current alcohol  use. He reports that he does not use drugs. family history includes Colon cancer (age of onset: 15) in his father; Diabetes in his father, mother, and sister; Heart disease (age of onset: 22) in his father; Hypertension in an other family member; Liver cancer in his paternal uncle; Pancreatitis in his father and paternal uncle; Stomach cancer in his maternal grandfather. Allergies  Allergen Reactions   Beta Adrenergic Blockers Other (See Comments)    Erectile dysfxn   Diphenhydramine Palpitations and Other (See Comments)    jittery Pt states he can take Benadryl in an emergency situation   Colchicine Diarrhea   Lipitor [Atorvastatin ] Other (See Comments)    Muscle cramps   Pecan Nut (Diagnostic) Palpitations    Pecans: itching and swelling of the tongue    Septra  [Sulfamethoxazole -Trimethoprim ] Itching   Current Outpatient Medications on File Prior to Visit  Medication Sig Dispense Refill   AMBULATORY NON FORMULARY MEDICATION Medication Name: Diltiazem 2% gel:Lidocaine  5% - Using your index finger, you should apply a small amount of medication inside the rectum up to your first knuckle/joint 3-4 times daily x 6-8 weeks. 45 g 2   amLODipine  (NORVASC ) 10 MG tablet Take 1 tablet (10 mg total) by mouth daily. 90 tablet 3   Aromatic Inhalants (VICKS VAPOINHALER IN) Inhale 1 puff into the lungs daily as needed (congestion).     ascorbic acid  (VITAMIN C ) 500 MG tablet Take 500 mg by mouth daily.     aspirin  EC 81 MG tablet Take 81 mg by mouth daily.     aspirin -sod bicarb-citric acid (ALKA-SELTZER) 325 MG TBEF tablet Take 650 mg by mouth every 6 (six) hours as needed (indigestion).     Blood Glucose Monitoring Suppl (ONE TOUCH ULTRA 2) w/Device KIT Use as directed  daily E11.9 1 each 0   Cholecalciferol  (VITAMIN D3 PO) Take 1 capsule by mouth daily.     citalopram  (CELEXA ) 40 MG tablet Take 1 tablet (40 mg total) by mouth daily. 90 tablet 3   clotrimazole -betamethasone  (LOTRISONE ) cream Apply 1 Application topically daily. 45 g 1   Continuous Blood Gluc Receiver (DEXCOM G7 RECEIVER) DEVI Use as directed once per day E11.9 1 each 0   Continuous Blood Gluc Sensor (DEXCOM G7 SENSOR) MISC Use as directed once every 2 weeks E11.9 6 each 3   cyclobenzaprine  (FLEXERIL ) 10 MG tablet Take 1 tablet (10 mg total) by mouth 3 (three) times daily as needed for muscle spasms. 30 tablet 2   fenofibrate  (TRICOR ) 145 MG tablet Take 1 tablet (145 mg total) by mouth daily. TAKE  1 TABLET(145 MG) BY MOUTH DAILY 90 tablet 3   fluticasone  (FLONASE ) 50 MCG/ACT nasal spray Place 2 sprays into both nostrils daily. (Patient taking differently: Place 2 sprays into both nostrils daily as needed for allergies.) 16 g 6   gabapentin  (NEURONTIN ) 300 MG capsule Take 900 mg by mouth 3 (three) times daily.     glucose blood test strip Use as instructed once daily E119 100 each 12   hydrocortisone  (ANUSOL -HC) 25 MG suppository Place 1 suppository (25 mg total) rectally every 12 (twelve) hours. 12 suppository 1   iron  polysaccharides (NU-IRON ) 150 MG capsule Take 1 capsule (150 mg total) by mouth daily. 90 capsule 1   Lancets MISC Use as directed daily E11.9 100 each 11   lisinopril  (ZESTRIL ) 40 MG tablet TAKE 1 TABLET(40 MG) BY MOUTH DAILY 90 tablet 3   loratadine  (CLARITIN ) 10 MG tablet TAKE 1 TABLET(10 MG) BY MOUTH DAILY AS NEEDED FOR ALLERGIES 90 tablet 3   Magnesium  500 MG TABS Take 500 mg by mouth daily.     omeprazole  (PRILOSEC) 40 MG capsule Take 1 capsule (40 mg total) by mouth daily. 90 capsule 3   oxyCODONE  (ROXICODONE ) 15 MG immediate release tablet Take 1 tablet (15 mg total) by mouth every 4 (four) hours as needed for severe pain. 30 tablet 0   PERCOCET 10-325 MG tablet Take 1  tablet by mouth every 4 (four) hours as needed for pain.     Phenylephrine -Acetaminophen  (TYLENOL  SINUS CONGESTION/PAIN PO) Take 2 tablets by mouth 3 (three) times daily as needed (congestion).     Potassium Chloride  ER 20 MEQ TBCR Take 1 tablet by mouth daily. 90 tablet 3   rosuvastatin  (CRESTOR ) 20 MG tablet Take 1 tablet (20 mg total) by mouth daily. TAKE 1 TABLET(20 MG) BY MOUTH DAILY 90 tablet 3   tadalafil  (CIALIS ) 20 MG tablet Take 0.5-1 tablets (10-20 mg total) by mouth every other day as needed for erectile dysfunction. 10 tablet 11   tamsulosin  (FLOMAX ) 0.4 MG CAPS capsule Take 1 capsule (0.4 mg total) by mouth daily. 90 capsule 3   tiZANidine  (ZANAFLEX ) 4 MG tablet Take 4 mg by mouth 3 (three) times daily as needed.     tolterodine  (DETROL  LA) 4 MG 24 hr capsule Take 1 capsule (4 mg total) by mouth daily. 90 capsule 3   vitamin B-12 (CYANOCOBALAMIN ) 1000 MCG tablet Take 1 tablet (1,000 mcg total) by mouth daily. 90 tablet 1   zinc  gluconate 50 MG tablet Take 50 mg by mouth daily.     hydrochlorothiazide  (MICROZIDE ) 12.5 MG capsule Take 1 capsule (12.5 mg total) by mouth daily. 90 capsule 3   No current facility-administered medications on file prior to visit.        ROS:  All others reviewed and negative.  Objective        PE:  BP 136/84 (BP Location: Left Arm, Patient Position: Sitting, Cuff Size: Normal)   Pulse (!) 50   Temp 98.5 F (36.9 C) (Oral)   Ht 5' 11 (1.803 m)   Wt 248 lb (112.5 kg)   SpO2 96%   BMI 34.59 kg/m                 Constitutional: Pt appears in NAD               HENT: Head: NCAT.                Right Ear: External ear normal.  Left Ear: External ear normal. Bilat tm's with mild erythema.  Max sinus areas non tender.  Pharynx with mild erythema, no exudate               Eyes: . Pupils are equal, round, and reactive to light. Conjunctivae and EOM are normal               Nose: without d/c or deformity               Neck: Neck supple.  Gross normal ROM               Cardiovascular: Normal rate and regular rhythm.                 Pulmonary/Chest: Effort normal and breath sounds without rales or wheezing.                Abd:  Soft, NT, ND, + BS, no organomegaly               Neurological: Pt is alert. At baseline orientation, motor grossly intact               Skin: Skin is warm. No rashes, no other new lesions, LE edema - none               Psychiatric: Pt behavior is normal without agitation   Micro: none  Cardiac tracings I have personally interpreted today:  none  Pertinent Radiological findings (summarize): none   Lab Results  Component Value Date   WBC 10.6 (H) 01/19/2023   HGB 14.3 01/19/2023   HCT 44.5 01/19/2023   PLT 444.0 (H) 01/19/2023   GLUCOSE 84 01/19/2023   CHOL 142 07/19/2022   TRIG 79.0 07/19/2022   HDL 40.70 07/19/2022   LDLDIRECT 124.0 04/05/2022   LDLCALC 85 07/19/2022   ALT 12 01/19/2023   AST 16 01/19/2023   NA 138 01/19/2023   K 4.0 01/19/2023   CL 102 01/19/2023   CREATININE 1.03 01/19/2023   BUN 10 01/19/2023   CO2 28 01/19/2023   TSH 0.50 04/05/2022   PSA 1.95 01/19/2023   INR 1.72 (H) 07/05/2010   HGBA1C 6.1 01/19/2023   MICROALBUR 3.1 (H) 04/05/2022   Assessment/Plan:  Harold [name] is a 58 y.o. Black or African American [2] male with  has a past medical history of Acute idiopathic pericarditis (04/08/2010), Acute sinusitis (06/04/2014), Anxiety, CHF (congestive heart failure) (HCC) (06/29/2010), Chronic back pain, Constipation, DDD (degenerative disc disease), lumbar (09/14/2012), Depression, Erectile dysfunction (09/14/2012), GERD (gastroesophageal reflux disease), Gout, unspecified (05/01/2007), Headache, History of pancreatitis, Hypertension, Hypertriglyceridemia, Left sided sciatica (09/14/2012), Neuropathy, Obesity, OSA (obstructive sleep apnea), Restless legs, and Type 2 diabetes mellitus with diabetic neuropathy, unspecified (HCC) (09/20/2019).  Encounter for well adult exam with  abnormal findings Age and sex appropriate education and counseling updated with regular exercise and diet Referrals for preventative services - none needed Immunizations addressed - declines covid booster, for shingrix at pharmacy Smoking counseling  - pt counsled to quit, pt not ready Evidence for depression or other mood disorder - none significant Most recent labs reviewed. I have personally reviewed and have noted: 1) the patient's medical and social history 2) The patient's current medications and supplements 3) The patient's height, weight, and BMI have been recorded in the chart   Allergic rhinitis Mild to mod, for depomedrol 80 mg IM,  to f/u any worsening symptoms or concerns  Essential hypertension BP Readings from Last 3 Encounters:  05/05/23 136/84  04/27/23 134/74  02/28/23 120/80   Stable, pt to continue medical treatment norvasc  10 every day, lisinopril  40 qd   Hyperlipidemia Lab Results  Component Value Date   LDLCALC 85 07/19/2022   uncontrolled, pt to continue current statin crestor  20 every day, tricor  145 every day and lower chol diet, declines other change for now   Type 2 diabetes mellitus (HCC) Lab Results  Component Value Date   HGBA1C 6.1 01/19/2023   With uncontrolled obesity, pt for increased mounjaro  10 mg weekly  Followup: Return in about 6 months (around 11/02/2023).  Lynwood Rush, MD 05/07/2023 6:45 PM  [facility]

## 2023-05-05 NOTE — Patient Instructions (Signed)
 Ok to increase the mounjaro  to 10 mg weekly  You had the steroid shot today  Please continue all other medications as before, and refills have been done if requested.  Please have the pharmacy call with any other refills you may need.  Please continue your efforts at being more active, low cholesterol diet, and weight control.  You are otherwise up to date with prevention measures today.  Please keep your appointments with your specialists as you may have planned  Please go to the LAB at the blood drawing area for the tests to be done  You will be contacted by phone if any changes need to be made immediately.  Otherwise, you will receive a letter about your results with an explanation, but please check with MyChart first.  Please make an Appointment to return in 6 months, or sooner if needed

## 2023-05-07 ENCOUNTER — Encounter: Payer: Self-pay | Admitting: Internal Medicine

## 2023-05-07 NOTE — Assessment & Plan Note (Signed)
Age and sex appropriate education and counseling updated with regular exercise and diet Referrals for preventative services - none needed Immunizations addressed - declines covid booster, for shingrix at pharmacy Smoking counseling  - pt counsled to quit, pt not ready Evidence for depression or other mood disorder - none significant Most recent labs reviewed. I have personally reviewed and have noted: 1) the patient's medical and social history 2) The patient's current medications and supplements 3) The patient's height, weight, and BMI have been recorded in the chart

## 2023-05-07 NOTE — Assessment & Plan Note (Signed)
 BP Readings from Last 3 Encounters:  05/05/23 136/84  04/27/23 134/74  02/28/23 120/80   Stable, pt to continue medical treatment norvasc  10 every day, lisinopril  40 qd

## 2023-05-07 NOTE — Assessment & Plan Note (Signed)
 Lab Results  Component Value Date   HGBA1C 6.1 01/19/2023   With uncontrolled obesity, pt for increased mounjaro  10 mg weekly

## 2023-05-07 NOTE — Assessment & Plan Note (Signed)
 Lab Results  Component Value Date   LDLCALC 85 07/19/2022   uncontrolled, pt to continue current statin crestor  20 every day, tricor  145 every day and lower chol diet, declines other change for now

## 2023-05-07 NOTE — Assessment & Plan Note (Signed)
Mild to mod, for depomedrol 80 mg IM, to f/u any worsening symptoms or concerns 

## 2023-05-08 ENCOUNTER — Encounter: Payer: Self-pay | Admitting: Internal Medicine

## 2023-05-08 LAB — MICROALBUMIN / CREATININE URINE RATIO
Creatinine,U: 179.8 mg/dL
Microalb Creat Ratio: 14.5 mg/g (ref 0.0–30.0)
Microalb, Ur: 2.6 mg/dL — ABNORMAL HIGH (ref 0.0–1.9)

## 2023-05-08 NOTE — Progress Notes (Signed)
 The test results show that your current treatment is OK, as the tests are stable.  Please continue the same plan.  There is no other need for change of treatment or further evaluation based on these results, at this time.  thanks

## 2023-05-19 ENCOUNTER — Ambulatory Visit
Admission: RE | Admit: 2023-05-19 | Discharge: 2023-05-19 | Disposition: A | Discharge status: Home / Self Care | Payer: Medicare HMO | Source: Ambulatory Visit | Attending: Neurosurgery | Admitting: Neurosurgery

## 2023-05-19 ENCOUNTER — Encounter: Payer: Self-pay | Admitting: Internal Medicine

## 2023-05-19 DIAGNOSIS — M5126 Other intervertebral disc displacement, lumbar region: Secondary | ICD-10-CM | POA: Diagnosis not present

## 2023-05-19 DIAGNOSIS — S32009K Unspecified fracture of unspecified lumbar vertebra, subsequent encounter for fracture with nonunion: Secondary | ICD-10-CM

## 2023-05-19 DIAGNOSIS — Z981 Arthrodesis status: Secondary | ICD-10-CM | POA: Diagnosis not present

## 2023-05-22 MED ORDER — CEPHALEXIN 500 MG PO CAPS: 500.0000 mg | ORAL_CAPSULE | Freq: Three times a day (TID) | ORAL | 0 refills | Status: DC

## 2023-05-26 ENCOUNTER — Encounter: Payer: Self-pay | Admitting: Internal Medicine

## 2023-05-26 ENCOUNTER — Other Ambulatory Visit: Payer: Self-pay | Admitting: Internal Medicine

## 2023-05-29 ENCOUNTER — Other Ambulatory Visit: Payer: Self-pay

## 2023-05-30 DIAGNOSIS — M5440 Lumbago with sciatica, unspecified side: Secondary | ICD-10-CM | POA: Diagnosis not present

## 2023-06-02 ENCOUNTER — Other Ambulatory Visit: Payer: Self-pay | Admitting: Neurosurgery

## 2023-06-10 ENCOUNTER — Other Ambulatory Visit: Payer: Self-pay | Admitting: Internal Medicine

## 2023-06-12 ENCOUNTER — Other Ambulatory Visit: Payer: Self-pay

## 2023-06-12 NOTE — Pre-Procedure Instructions (Signed)
 Surgical Instructions   Your procedure is scheduled on June 21, 2023. Report to [facility] Main Entrance "A" at 8:50 A.M., then check in with the Admitting office. Any questions or running late day of surgery: call [phone]  Questions prior to your surgery date: call [phone], Monday-Friday, 8am-4pm. If you experience any cold or flu symptoms such as cough, fever, chills, shortness of breath, etc. between now and your scheduled surgery, please notify us at the above number.     Remember:  Do not eat or drink after midnight the night before your surgery    Take these medicines the morning of surgery with A SIP OF WATER: amLODipine (NORVASC)  cephALEXin (KEFLEX)  citalopram (CELEXA)  fenofibrate (TRICOR)  gabapentin (NEURONTIN)  omeprazole (PRILOSEC)  rosuvastatin (CRESTOR)  tamsulosin (FLOMAX)  tolterodine (DETROL LA)    May take these medicines IF NEEDED: cyclobenzaprine (FLEXERIL)  fluticasone (FLONASE) nasal spray  loratadine (CLARITIN)  oxyCODONE (ROXICODONE)  PERCOCET  tiZANidine (ZANAFLEX)    Follow your surgeon's instructions on when to stop Aspirin.  If no instructions were given by your surgeon then you will need to call the office to get those instructions.     One week prior to surgery, STOP taking any Aleve, Naproxen, Ibuprofen, Motrin, Advil, Goody's, BC's, all herbal medications, fish oil, and non-prescription vitamins.   WHAT DO I DO ABOUT MY DIABETES MEDICATION?   STOP taking your tirzepatide [facility]) one week prior to surgery. DO NOT take any doses after March 18th.   HOW TO MANAGE YOUR DIABETES BEFORE AND AFTER SURGERY  Why is it important to control my blood sugar before and after surgery? Improving blood sugar levels before and after surgery helps healing and can limit problems. A way of improving blood sugar control is eating a healthy diet by:  Eating less sugar and carbohydrates  Increasing activity/exercise  Talking with your  doctor about reaching your blood sugar goals High blood sugars (greater than 180 mg/dL) can raise your risk of infections and slow your recovery, so you will need to focus on controlling your diabetes during the weeks before surgery. Make sure that the doctor who takes care of your diabetes knows about your planned surgery including the date and location.  How do I manage my blood sugar before surgery? Check your blood sugar at least 4 times a day, starting 2 days before surgery, to make sure that the level is not too high or low.  Check your blood sugar the morning of your surgery when you wake up and every 2 hours until you get to the Short Stay unit.  If your blood sugar is less than 70 mg/dL, you will need to treat for low blood sugar: Do not take insulin. Treat a low blood sugar (less than 70 mg/dL) with  cup of clear juice (cranberry or apple), 4 glucose tablets, OR glucose gel. Recheck blood sugar in 15 minutes after treatment (to make sure it is greater than 70 mg/dL). If your blood sugar is not greater than 70 mg/dL on recheck, call [phone] for further instructions. Report your blood sugar to the short stay nurse when you get to Short Stay.  If you are admitted to the hospital after surgery: Your blood sugar will be checked by the staff and you will probably be given insulin after surgery (instead of oral diabetes medicines) to make sure you have good blood sugar levels. The goal for blood sugar control after surgery is 80-180 mg/dL.  Do NOT Smoke (Tobacco/Vaping) for 24 hours prior to your procedure.  If you use a CPAP at night, you may bring your mask/headgear for your overnight stay.   You will be asked to remove any contacts, glasses, piercing's, hearing aid's, dentures/partials prior to surgery. Please bring cases for these items if needed.    Patients discharged the day of surgery will not be allowed to drive home, and someone needs to stay with  them for 24 hours.  SURGICAL WAITING ROOM VISITATION Patients may have no more than 2 support people in the waiting area - these visitors may rotate.   Pre-op nurse will coordinate an appropriate time for 1 ADULT support person, who may not rotate, to accompany patient in pre-op.  Children under the age of 28 must have an adult with them who is not the patient and must remain in the main waiting area with an adult.  If the patient needs to stay at the hospital during part of their recovery, the visitor guidelines for inpatient rooms apply.  Please refer to the [facility] website for the visitor guidelines for any additional information.   If you received a COVID test during your pre-op visit  it is requested that you wear a mask when out in public, stay away from anyone that may not be feeling well and notify your surgeon if you develop symptoms. If you have been in contact with anyone that has tested positive in the last 10 days please notify you surgeon.      Pre-operative 5 CHG Bathing Instructions   You can play a key role in reducing the risk of infection after surgery. Your skin needs to be as free of germs as possible. You can reduce the number of germs on your skin by washing with CHG (chlorhexidine gluconate) soap before surgery. CHG is an antiseptic soap that kills germs and continues to kill germs even after washing.   DO NOT use if you have an allergy to chlorhexidine/CHG or antibacterial soaps. If your skin becomes reddened or irritated, stop using the CHG and notify one of our RNs at [phone].   Please shower with the CHG soap starting 4 days before surgery using the following schedule:     Please keep in mind the following:  DO NOT shave, including legs and underarms, starting the day of your first shower.   You may shave your face at any point before/day of surgery.  Place clean sheets on your bed the day you start using CHG soap. Use a clean washcloth (not used  since being washed) for each shower. DO NOT sleep with pets once you start using the CHG.   CHG Shower Instructions:  Wash your face and private area with normal soap. If you choose to wash your hair, wash first with your normal shampoo.  After you use shampoo/soap, rinse your hair and body thoroughly to remove shampoo/soap residue.  Turn the water OFF and apply about 3 tablespoons (45 ml) of CHG soap to a CLEAN washcloth.  Apply CHG soap ONLY FROM YOUR NECK DOWN TO YOUR TOES (washing for 3-5 minutes)  DO NOT use CHG soap on face, private areas, open wounds, or sores.  Pay special attention to the area where your surgery is being performed.  If you are having back surgery, having someone wash your back for you may be helpful. Wait 2 minutes after CHG soap is applied, then you may rinse off the CHG soap.  Pat dry with a  clean towel  Put on clean clothes/pajamas   If you choose to wear lotion, please use ONLY the CHG-compatible lotions that are listed below.  Additional instructions for the day of surgery: DO NOT APPLY any lotions, deodorants, cologne, or perfumes.   Do not bring valuables to the hospital. [facility] is not responsible for any belongings/valuables. Do not wear nail polish, gel polish, artificial nails, or any other type of covering on natural nails (fingers and toes) Do not wear jewelry or makeup Put on clean/comfortable clothes.  Please brush your teeth.  Ask your nurse before applying any prescription medications to the skin.     CHG Compatible Lotions   Aveeno Moisturizing lotion  Cetaphil Moisturizing Cream  Cetaphil Moisturizing Lotion  Clairol Herbal Essence Moisturizing Lotion, Dry Skin  Clairol Herbal Essence Moisturizing Lotion, Extra Dry Skin  Clairol Herbal Essence Moisturizing Lotion, Normal Skin  Curel Age Defying Therapeutic Moisturizing Lotion with Alpha Hydroxy  Curel Extreme Care Body Lotion  Curel Soothing Hands Moisturizing Hand Lotion  Curel  Therapeutic Moisturizing Cream, Fragrance-Free  Curel Therapeutic Moisturizing Lotion, Fragrance-Free  Curel Therapeutic Moisturizing Lotion, Original Formula  Eucerin Daily Replenishing Lotion  Eucerin Dry Skin Therapy Plus Alpha Hydroxy Crme  Eucerin Dry Skin Therapy Plus Alpha Hydroxy Lotion  Eucerin Original Crme  Eucerin Original Lotion  Eucerin Plus Crme Eucerin Plus Lotion  Eucerin TriLipid Replenishing Lotion  Keri Anti-Bacterial Hand Lotion  Keri Deep Conditioning Original Lotion Dry Skin Formula Softly Scented  Keri Deep Conditioning Original Lotion, Fragrance Free Sensitive Skin Formula  Keri Lotion Fast Absorbing Fragrance Free Sensitive Skin Formula  Keri Lotion Fast Absorbing Softly Scented Dry Skin Formula  Keri Original Lotion  Keri Skin Renewal Lotion Keri Silky Smooth Lotion  Keri Silky Smooth Sensitive Skin Lotion  Nivea Body Creamy Conditioning Oil  Nivea Body Extra Enriched Lotion  Nivea Body Original Lotion  Nivea Body Sheer Moisturizing Lotion Nivea Crme  Nivea Skin Firming Lotion  NutraDerm 30 Skin Lotion  NutraDerm Skin Lotion  NutraDerm Therapeutic Skin Cream  NutraDerm Therapeutic Skin Lotion  ProShield Protective Hand Cream  Provon moisturizing lotion  Please read over the following fact sheets that you were given.

## 2023-06-13 ENCOUNTER — Inpatient Hospital Stay (HOSPITAL_COMMUNITY)
Admission: RE | Admit: 2023-06-13 | Discharge: 2023-06-13 | Disposition: A | Discharge status: Home / Self Care | Source: Ambulatory Visit

## 2023-06-14 ENCOUNTER — Encounter: Payer: Self-pay | Admitting: Podiatry

## 2023-06-14 ENCOUNTER — Ambulatory Visit (INDEPENDENT_AMBULATORY_CARE_PROVIDER_SITE_OTHER): Admitting: Podiatry

## 2023-06-14 DIAGNOSIS — M48062 Spinal stenosis, lumbar region with neurogenic claudication: Secondary | ICD-10-CM | POA: Diagnosis not present

## 2023-06-14 DIAGNOSIS — M7752 Other enthesopathy of left foot: Secondary | ICD-10-CM | POA: Diagnosis not present

## 2023-06-14 DIAGNOSIS — M25512 Pain in left shoulder: Secondary | ICD-10-CM | POA: Diagnosis not present

## 2023-06-14 DIAGNOSIS — G894 Chronic pain syndrome: Secondary | ICD-10-CM | POA: Diagnosis not present

## 2023-06-14 DIAGNOSIS — M7751 Other enthesopathy of right foot: Secondary | ICD-10-CM | POA: Diagnosis not present

## 2023-06-14 DIAGNOSIS — Z9889 Other specified postprocedural states: Secondary | ICD-10-CM | POA: Diagnosis not present

## 2023-06-14 DIAGNOSIS — M5440 Lumbago with sciatica, unspecified side: Secondary | ICD-10-CM | POA: Diagnosis not present

## 2023-06-14 MED ORDER — TRIAMCINOLONE ACETONIDE 10 MG/ML IJ SUSP
10.0000 mg | Freq: Once | INTRAMUSCULAR | Status: AC
  Administered 2023-06-14: 10 mg via INTRA_ARTICULAR

## 2023-06-15 ENCOUNTER — Other Ambulatory Visit: Payer: Self-pay

## 2023-06-15 ENCOUNTER — Encounter (HOSPITAL_COMMUNITY)
Admission: RE | Admit: 2023-06-15 | Discharge: 2023-06-15 | Disposition: A | Discharge status: Home / Self Care | Source: Ambulatory Visit | Attending: Neurosurgery | Admitting: Neurosurgery

## 2023-06-15 ENCOUNTER — Encounter (HOSPITAL_COMMUNITY): Payer: Self-pay

## 2023-06-15 VITALS — BP 172/77 | HR 58 | Temp 98.4°F | Resp 18 | Ht 71.0 in | Wt 247.0 lb

## 2023-06-15 DIAGNOSIS — E669 Obesity, unspecified: Secondary | ICD-10-CM | POA: Diagnosis not present

## 2023-06-15 DIAGNOSIS — I10 Essential (primary) hypertension: Secondary | ICD-10-CM | POA: Diagnosis not present

## 2023-06-15 DIAGNOSIS — E119 Type 2 diabetes mellitus without complications: Secondary | ICD-10-CM | POA: Diagnosis not present

## 2023-06-15 DIAGNOSIS — Z01812 Encounter for preprocedural laboratory examination: Secondary | ICD-10-CM | POA: Diagnosis not present

## 2023-06-15 DIAGNOSIS — Z981 Arthrodesis status: Secondary | ICD-10-CM | POA: Diagnosis not present

## 2023-06-15 DIAGNOSIS — K219 Gastro-esophageal reflux disease without esophagitis: Secondary | ICD-10-CM | POA: Insufficient documentation

## 2023-06-15 DIAGNOSIS — G4733 Obstructive sleep apnea (adult) (pediatric): Secondary | ICD-10-CM | POA: Diagnosis not present

## 2023-06-15 DIAGNOSIS — Z01818 Encounter for other preprocedural examination: Secondary | ICD-10-CM

## 2023-06-15 LAB — BASIC METABOLIC PANEL
Anion gap: 7 (ref 5–15)
BUN: 17 mg/dL (ref 6–20)
CO2: 26 mmol/L (ref 22–32)
Calcium: 9.2 mg/dL (ref 8.9–10.3)
Chloride: 106 mmol/L (ref 98–111)
Creatinine, Ser: 1.17 mg/dL (ref 0.61–1.24)
GFR, Estimated: >60 mL/min (ref >60)
Glucose, Bld: 145 mg/dL — ABNORMAL HIGH (ref 70–99)
Potassium: 4 mmol/L (ref 3.5–5.1)
Sodium: 139 mmol/L (ref 135–145)

## 2023-06-15 LAB — TYPE AND SCREEN
ABO/RH(D): A POS
Antibody Screen: NEGATIVE

## 2023-06-15 LAB — GLUCOSE, CAPILLARY: Glucose-Capillary: 159 mg/dL — ABNORMAL HIGH (ref 70–99)

## 2023-06-15 LAB — CBC
HCT: 43 % (ref 39.0–52.0)
Hemoglobin: 14.1 g/dL (ref 13.0–17.0)
MCH: 31.8 pg (ref 26.0–34.0)
MCHC: 32.8 g/dL (ref 30.0–36.0)
MCV: 97.1 fL (ref 80.0–100.0)
Platelets: 419 10*3/uL — ABNORMAL HIGH (ref 150–400)
RBC: 4.43 MIL/uL (ref 4.22–5.81)
RDW: 13.2 % (ref 11.5–15.5)
WBC: 11.4 10*3/uL — ABNORMAL HIGH (ref 4.0–10.5)
nRBC: 0 % (ref 0.0–0.2)

## 2023-06-15 LAB — SURGICAL PCR SCREEN
MRSA, PCR: NEGATIVE
Staphylococcus aureus: NEGATIVE

## 2023-06-15 NOTE — Pre-Procedure Instructions (Signed)
 Surgical Instructions   Your procedure is scheduled on June 21, 2023. Report to [facility] Main Entrance "A" at 8:50 A.M., then check in with the Admitting office. Any questions or running late day of surgery: call [phone]  Questions prior to your surgery date: call [phone], Monday-Friday, 8am-4pm. If you experience any cold or flu symptoms such as cough, fever, chills, shortness of breath, etc. between now and your scheduled surgery, please notify us at the above number.     Remember:  Do not eat or drink after midnight the night before your surgery    Take these medicines the morning of surgery with A SIP OF WATER: amLODipine (NORVASC)  cephALEXin (KEFLEX)  citalopram (CELEXA)  fenofibrate (TRICOR)  gabapentin (NEURONTIN)  omeprazole (PRILOSEC)  rosuvastatin (CRESTOR)  tamsulosin (FLOMAX)  tolterodine (DETROL LA)    May take these medicines IF NEEDED: cyclobenzaprine (FLEXERIL)  fluticasone (FLONASE) nasal spray  loratadine (CLARITIN)  oxyCODONE (ROXICODONE)  PERCOCET  tiZANidine (ZANAFLEX)    Follow your surgeon's instructions on when to stop Aspirin.  If no instructions were given by your surgeon then you will need to call the office to get those instructions.     One week prior to surgery, STOP taking any Aleve, Naproxen, Ibuprofen, Motrin, Advil, Goody's, BC's, all herbal medications, fish oil, and non-prescription vitamins.   WHAT DO I DO ABOUT MY DIABETES MEDICATION?   STOP taking your tirzepatide [facility]) one week prior to surgery. DO NOT take any doses after March 18th.   HOW TO MANAGE YOUR DIABETES BEFORE AND AFTER SURGERY  Why is it important to control my blood sugar before and after surgery? Improving blood sugar levels before and after surgery helps healing and can limit problems. A way of improving blood sugar control is eating a healthy diet by:  Eating less sugar and carbohydrates  Increasing activity/exercise  Talking with your  doctor about reaching your blood sugar goals High blood sugars (greater than 180 mg/dL) can raise your risk of infections and slow your recovery, so you will need to focus on controlling your diabetes during the weeks before surgery. Make sure that the doctor who takes care of your diabetes knows about your planned surgery including the date and location.  How do I manage my blood sugar before surgery? Check your blood sugar at least 4 times a day, starting 2 days before surgery, to make sure that the level is not too high or low.  Check your blood sugar the morning of your surgery when you wake up and every 2 hours until you get to the Short Stay unit.  If your blood sugar is less than 70 mg/dL, you will need to treat for low blood sugar: Do not take insulin. Treat a low blood sugar (less than 70 mg/dL) with  cup of clear juice (cranberry or apple), 4 glucose tablets, OR glucose gel. Recheck blood sugar in 15 minutes after treatment (to make sure it is greater than 70 mg/dL). If your blood sugar is not greater than 70 mg/dL on recheck, call [phone] for further instructions. Report your blood sugar to the short stay nurse when you get to Short Stay.  If you are admitted to the hospital after surgery: Your blood sugar will be checked by the staff and you will probably be given insulin after surgery (instead of oral diabetes medicines) to make sure you have good blood sugar levels. The goal for blood sugar control after surgery is 80-180 mg/dL.  Do NOT Smoke (Tobacco/Vaping) for 24 hours prior to your procedure.  If you use a CPAP at night, you may bring your mask/headgear for your overnight stay.   You will be asked to remove any contacts, glasses, piercing's, hearing aid's, dentures/partials prior to surgery. Please bring cases for these items if needed.    Patients discharged the day of surgery will not be allowed to drive home, and someone needs to stay with  them for 24 hours.  SURGICAL WAITING ROOM VISITATION Patients may have no more than 2 support people in the waiting area - these visitors may rotate.   Pre-op nurse will coordinate an appropriate time for 1 ADULT support person, who may not rotate, to accompany patient in pre-op.  Children under the age of 28 must have an adult with them who is not the patient and must remain in the main waiting area with an adult.  If the patient needs to stay at the hospital during part of their recovery, the visitor guidelines for inpatient rooms apply.  Please refer to the [facility] website for the visitor guidelines for any additional information.   If you received a COVID test during your pre-op visit  it is requested that you wear a mask when out in public, stay away from anyone that may not be feeling well and notify your surgeon if you develop symptoms. If you have been in contact with anyone that has tested positive in the last 10 days please notify you surgeon.      Pre-operative 5 CHG Bathing Instructions   You can play a key role in reducing the risk of infection after surgery. Your skin needs to be as free of germs as possible. You can reduce the number of germs on your skin by washing with CHG (chlorhexidine gluconate) soap before surgery. CHG is an antiseptic soap that kills germs and continues to kill germs even after washing.   DO NOT use if you have an allergy to chlorhexidine/CHG or antibacterial soaps. If your skin becomes reddened or irritated, stop using the CHG and notify one of our RNs at [phone].   Please shower with the CHG soap starting 4 days before surgery using the following schedule:     Please keep in mind the following:  DO NOT shave, including legs and underarms, starting the day of your first shower.   You may shave your face at any point before/day of surgery.  Place clean sheets on your bed the day you start using CHG soap. Use a clean washcloth (not used  since being washed) for each shower. DO NOT sleep with pets once you start using the CHG.   CHG Shower Instructions:  Wash your face and private area with normal soap. If you choose to wash your hair, wash first with your normal shampoo.  After you use shampoo/soap, rinse your hair and body thoroughly to remove shampoo/soap residue.  Turn the water OFF and apply about 3 tablespoons (45 ml) of CHG soap to a CLEAN washcloth.  Apply CHG soap ONLY FROM YOUR NECK DOWN TO YOUR TOES (washing for 3-5 minutes)  DO NOT use CHG soap on face, private areas, open wounds, or sores.  Pay special attention to the area where your surgery is being performed.  If you are having back surgery, having someone wash your back for you may be helpful. Wait 2 minutes after CHG soap is applied, then you may rinse off the CHG soap.  Pat dry with a  clean towel  Put on clean clothes/pajamas   If you choose to wear lotion, please use ONLY the CHG-compatible lotions that are listed below.  Additional instructions for the day of surgery: DO NOT APPLY any lotions, deodorants, cologne, or perfumes.   Do not bring valuables to the hospital. [facility] is not responsible for any belongings/valuables. Do not wear nail polish, gel polish, artificial nails, or any other type of covering on natural nails (fingers and toes) Do not wear jewelry or makeup Put on clean/comfortable clothes.  Please brush your teeth.  Ask your nurse before applying any prescription medications to the skin.     CHG Compatible Lotions   Aveeno Moisturizing lotion  Cetaphil Moisturizing Cream  Cetaphil Moisturizing Lotion  Clairol Herbal Essence Moisturizing Lotion, Dry Skin  Clairol Herbal Essence Moisturizing Lotion, Extra Dry Skin  Clairol Herbal Essence Moisturizing Lotion, Normal Skin  Curel Age Defying Therapeutic Moisturizing Lotion with Alpha Hydroxy  Curel Extreme Care Body Lotion  Curel Soothing Hands Moisturizing Hand Lotion  Curel  Therapeutic Moisturizing Cream, Fragrance-Free  Curel Therapeutic Moisturizing Lotion, Fragrance-Free  Curel Therapeutic Moisturizing Lotion, Original Formula  Eucerin Daily Replenishing Lotion  Eucerin Dry Skin Therapy Plus Alpha Hydroxy Crme  Eucerin Dry Skin Therapy Plus Alpha Hydroxy Lotion  Eucerin Original Crme  Eucerin Original Lotion  Eucerin Plus Crme Eucerin Plus Lotion  Eucerin TriLipid Replenishing Lotion  Keri Anti-Bacterial Hand Lotion  Keri Deep Conditioning Original Lotion Dry Skin Formula Softly Scented  Keri Deep Conditioning Original Lotion, Fragrance Free Sensitive Skin Formula  Keri Lotion Fast Absorbing Fragrance Free Sensitive Skin Formula  Keri Lotion Fast Absorbing Softly Scented Dry Skin Formula  Keri Original Lotion  Keri Skin Renewal Lotion Keri Silky Smooth Lotion  Keri Silky Smooth Sensitive Skin Lotion  Nivea Body Creamy Conditioning Oil  Nivea Body Extra Enriched Lotion  Nivea Body Original Lotion  Nivea Body Sheer Moisturizing Lotion Nivea Crme  Nivea Skin Firming Lotion  NutraDerm 30 Skin Lotion  NutraDerm Skin Lotion  NutraDerm Therapeutic Skin Cream  NutraDerm Therapeutic Skin Lotion  ProShield Protective Hand Cream  Provon moisturizing lotion  Please read over the following fact sheets that you were given.

## 2023-06-15 NOTE — Progress Notes (Signed)
 Subjective:   Patient ID: Harold [name], male   DOB: 58 y.o.   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

## 2023-06-15 NOTE — Progress Notes (Signed)
 PCP - Dr. Oliver Barre Cardiologist - Dr. Rollene Rotunda  PPM/ICD - denies   Chest x-ray - 09/27/20 EKG - 08/30/22 Stress Test - 08/12/15 ECHO - 03/26/10 Cardiac Cath - 06/14/10  Sleep Study - OSA+ BiPap- nightly, unsure of pressure settings  Fasting Blood Sugar - 90 Checks Blood Sugar once a week  Last dose of GLP1 agonist-  06/09/23 GLP1 instructions: Hold one week, pt knows not to take any further doses  Blood Thinner Instructions: n/a Aspirin Instructions: hold 1 week. Last dose 3/15  ERAS Protcol - no, NPO   COVID TEST- n/a   Anesthesia review: yes, cardiac hx  Patient denies shortness of breath, fever, cough and chest pain at PAT appointment   All instructions explained to the patient, with a verbal understanding of the material. Patient agrees to go over the instructions while at home for a better understanding. The opportunity to ask questions was provided.

## 2023-06-16 DIAGNOSIS — H52223 Regular astigmatism, bilateral: Secondary | ICD-10-CM | POA: Diagnosis not present

## 2023-06-16 DIAGNOSIS — H524 Presbyopia: Secondary | ICD-10-CM | POA: Diagnosis not present

## 2023-06-16 DIAGNOSIS — H5202 Hypermetropia, left eye: Secondary | ICD-10-CM | POA: Diagnosis not present

## 2023-06-16 DIAGNOSIS — E119 Type 2 diabetes mellitus without complications: Secondary | ICD-10-CM | POA: Diagnosis not present

## 2023-06-16 NOTE — Progress Notes (Signed)
 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   Continues to follow with cardiology for risk factor management.  Prior exercise tolerance test 2017 was not consistent with ischemia but did show hypertensive response.  Last seen by Dr. Antoine Poche on 08/30/2022, noted to be doing well at that time, no changes to management, 1 year follow-up recommended.  Patient reports last dose GLP-1 agonist 06/09/2023.  Preop labs reviewed, unremarkable.  EKG 08/30/2022: NSR.  Rate 64.  RAD.    Harold [name] [facility] Short Stay Center/Anesthesiology Phone [phone] 06/16/2023 1:16 PM

## 2023-06-16 NOTE — Anesthesia Preprocedure Evaluation (Signed)
 Anesthesia Evaluation    Airway        Dental   Pulmonary Current Smoker          Cardiovascular hypertension,      Neuro/Psych    GI/Hepatic   Endo/Other  diabetes    Renal/GU      Musculoskeletal   Abdominal   Peds  Hematology   Anesthesia Other Findings   Reproductive/Obstetrics                              Anesthesia Physical Anesthesia Plan  ASA:   Anesthesia Plan:    Post-op Pain Management:    Induction:   PONV Risk Score and Plan:   Airway Management Planned:   Additional Equipment:   Intra-op Plan:   Post-operative Plan:   Informed Consent:   Plan Discussed with:   Anesthesia Plan Comments: (PAT note by Antionette Poles, PA-C: 58 year old male with pertinent history including smoking, HTN, NIDDM2 (A1c 6.1 on 05/05/2023), HLD, OSA (does not use BiPAP), GERD on PPI, 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   Continues to follow with cardiology for risk factor management.  Prior exercise tolerance test 2017 was not consistent with ischemia but did show hypertensive response.  Last seen by Dr. Antoine Poche on 08/30/2022, noted to be doing well at that time, no changes to management, 1 year follow-up recommended.  Patient reports last dose GLP-1 agonist 06/09/2023.  Preop labs reviewed, unremarkable.  EKG 08/30/2022: NSR.  Rate 64.  RAD.   )         Anesthesia Quick Evaluation

## 2023-06-21 ENCOUNTER — Encounter (HOSPITAL_COMMUNITY): Admission: RE | Payer: Self-pay | Source: Home / Self Care

## 2023-06-21 ENCOUNTER — Inpatient Hospital Stay (HOSPITAL_COMMUNITY): Admission: RE | Admit: 2023-06-21 | Source: Home / Self Care | Admitting: Neurosurgery

## 2023-06-21 ENCOUNTER — Encounter (HOSPITAL_COMMUNITY): Payer: Self-pay | Admitting: Physician Assistant

## 2023-06-21 SURGERY — LAMINECTOMY WITH POSTERIOR LATERAL ARTHRODESIS LEVEL 1
Anesthesia: General | Site: Back

## 2023-06-29 ENCOUNTER — Encounter: Payer: Self-pay | Admitting: Internal Medicine

## 2023-06-30 MED ORDER — TADALAFIL 20 MG PO TABS: 10.0000 mg | ORAL_TABLET | ORAL | 11 refills | Status: AC | PRN

## 2023-07-19 ENCOUNTER — Ambulatory Visit: Payer: Medicare HMO | Admitting: Podiatry

## 2023-07-21 DIAGNOSIS — M1711 Unilateral primary osteoarthritis, right knee: Secondary | ICD-10-CM | POA: Diagnosis not present

## 2023-07-22 ENCOUNTER — Other Ambulatory Visit: Payer: Self-pay | Admitting: Internal Medicine

## 2023-07-22 DIAGNOSIS — I10 Essential (primary) hypertension: Secondary | ICD-10-CM

## 2023-07-24 ENCOUNTER — Other Ambulatory Visit: Payer: Self-pay

## 2023-07-31 ENCOUNTER — Encounter: Payer: Self-pay | Admitting: Internal Medicine

## 2023-08-01 MED ORDER — PREDNISONE 10 MG PO TABS: ORAL_TABLET | ORAL | 0 refills | Status: DC

## 2023-08-03 DIAGNOSIS — M48062 Spinal stenosis, lumbar region with neurogenic claudication: Secondary | ICD-10-CM | POA: Diagnosis not present

## 2023-08-17 ENCOUNTER — Other Ambulatory Visit: Payer: Self-pay | Admitting: Internal Medicine

## 2023-08-17 DIAGNOSIS — M48062 Spinal stenosis, lumbar region with neurogenic claudication: Secondary | ICD-10-CM | POA: Diagnosis not present

## 2023-08-19 ENCOUNTER — Encounter: Payer: Self-pay | Admitting: Internal Medicine

## 2023-08-20 ENCOUNTER — Other Ambulatory Visit: Payer: Self-pay | Admitting: Internal Medicine

## 2023-08-22 ENCOUNTER — Other Ambulatory Visit: Payer: Self-pay

## 2023-08-22 MED ORDER — AZITHROMYCIN 250 MG PO TABS: ORAL_TABLET | ORAL | 1 refills | Status: AC

## 2023-08-22 MED ORDER — PREDNISONE 10 MG PO TABS: ORAL_TABLET | ORAL | 0 refills | Status: DC

## 2023-09-18 ENCOUNTER — Encounter: Payer: Self-pay | Admitting: Podiatry

## 2023-09-18 ENCOUNTER — Ambulatory Visit (INDEPENDENT_AMBULATORY_CARE_PROVIDER_SITE_OTHER): Admitting: Podiatry

## 2023-09-18 VITALS — Ht 71.0 in | Wt 247.0 lb

## 2023-09-18 DIAGNOSIS — M19072 Primary osteoarthritis, left ankle and foot: Secondary | ICD-10-CM

## 2023-09-18 DIAGNOSIS — M79674 Pain in right toe(s): Secondary | ICD-10-CM | POA: Diagnosis not present

## 2023-09-18 DIAGNOSIS — M79675 Pain in left toe(s): Secondary | ICD-10-CM | POA: Diagnosis not present

## 2023-09-18 DIAGNOSIS — B351 Tinea unguium: Secondary | ICD-10-CM | POA: Diagnosis not present

## 2023-09-18 DIAGNOSIS — M19071 Primary osteoarthritis, right ankle and foot: Secondary | ICD-10-CM | POA: Diagnosis not present

## 2023-09-18 MED ORDER — BETAMETHASONE SOD PHOS & ACET 6 (3-3) MG/ML IJ SUSP
3.0000 mg | Freq: Once | INTRAMUSCULAR | Status: AC
  Administered 2023-09-18: 3 mg via INTRA_ARTICULAR

## 2023-09-18 NOTE — Progress Notes (Signed)
   Chief Complaint  Patient presents with   Nail Problem    Pt is here for [facility].    SUBJECTIVE Patient with a history of diabetes mellitus presents to office today complaining of elongated, thickened nails that cause pain while ambulating in shoes.  Patient is unable to trim their own nails.   Patient also states that the injection she received last visit to his first MTP bilateral helped significantly.  The pain is slowly returning.  This provided a few months of relief.  He is requesting injections today patient is here for further evaluation and treatment.   Past Medical History:  Diagnosis Date   Acute idiopathic pericarditis 04/08/2010   Qualifier: Diagnosis of  By: Lelon RIGGERS, [name]     Acute sinusitis 06/04/2014   Anxiety    CHF (congestive heart failure) (HCC) 06/29/2010   was pericardititis not CHF   Chronic back pain    Constipation    DDD (degenerative disc disease), lumbar 09/14/2012   Depression    pt denies   Erectile dysfunction 09/14/2012   GERD (gastroesophageal reflux disease)    ocassional   Gout, unspecified 05/01/2007   Qualifier: Diagnosis of  By: Krystal MD, Reyes A    Headache    migraines in the past   History of pancreatitis    a. admx 04-2009.SABRASABRA? 2-2 triglycerides   Hypertension    Hypertriglyceridemia    a. followed by [facility]   Left sided sciatica 09/14/2012   Neuropathy    Obesity    OSA (obstructive sleep apnea)    CPAP- not current   Restless legs    Type 2 diabetes mellitus with diabetic neuropathy, unspecified (HCC) 09/20/2019    OBJECTIVE General Patient is awake, alert, and oriented x 3 and in no acute distress. Derm Skin is dry and supple bilateral. Negative open lesions or macerations. Remaining integument unremarkable. Nails are tender, long, thickened and dystrophic with subungual debris, consistent with onychomycosis, 1-5 bilateral. No signs of infection noted. Vasc  DP and PT pedal pulses palpable bilaterally.  Temperature gradient within normal limits.  Neuro Light touch and protective threshold sensation diminished bilaterally.  Musculoskeletal Exam pain on palpation range of motion of the first MTP bilateral  ASSESSMENT 1. Diabetes Mellitus w/ peripheral neuropathy 2.  Pain due to onychomycosis of toenails bilateral 3.  Hallux limitus/MTP arthritis bilateral  PLAN OF CARE 1. Patient evaluated today. 2. Instructed to maintain good pedal hygiene and foot care. Stressed importance of controlling blood sugar.  3. Mechanical debridement of nails 1-5 bilaterally performed using a nail nipper.  4.  Injection of 0.5 cc Celestone  Soluspan injected in the first MTP bilateral  5.  Return to clinic in 3 mos. for routine foot care  *Goes by Herlene Thresa EMERSON Janit, DPM [facility]  Dr. Thresa EMERSON Janit, DPM    [address] [place], [place] [address]                Office [phone]  Fax [phone]

## 2023-09-27 DIAGNOSIS — Z9889 Other specified postprocedural states: Secondary | ICD-10-CM | POA: Diagnosis not present

## 2023-09-27 DIAGNOSIS — M25561 Pain in right knee: Secondary | ICD-10-CM | POA: Diagnosis not present

## 2023-09-27 DIAGNOSIS — M1711 Unilateral primary osteoarthritis, right knee: Secondary | ICD-10-CM | POA: Diagnosis not present

## 2023-09-27 DIAGNOSIS — M48062 Spinal stenosis, lumbar region with neurogenic claudication: Secondary | ICD-10-CM | POA: Diagnosis not present

## 2023-09-27 DIAGNOSIS — G894 Chronic pain syndrome: Secondary | ICD-10-CM | POA: Diagnosis not present

## 2023-09-27 DIAGNOSIS — M961 Postlaminectomy syndrome, not elsewhere classified: Secondary | ICD-10-CM | POA: Diagnosis not present

## 2023-10-03 ENCOUNTER — Encounter: Payer: Self-pay | Admitting: Internal Medicine

## 2023-10-04 MED ORDER — AMOXICILLIN-POT CLAVULANATE 875-125 MG PO TABS: 1.0000 | ORAL_TABLET | Freq: Two times a day (BID) | ORAL | 0 refills | Status: DC

## 2023-10-04 MED ORDER — VARENICLINE TARTRATE 1 MG PO TABS: 1.0000 mg | ORAL_TABLET | Freq: Two times a day (BID) | ORAL | 2 refills | Status: AC

## 2023-10-04 MED ORDER — VARENICLINE TARTRATE (STARTER) 0.5 MG X 11 & 1 MG X 42 PO TBPK: ORAL_TABLET | ORAL | 0 refills | Status: DC

## 2023-10-17 DIAGNOSIS — M25551 Pain in right hip: Secondary | ICD-10-CM | POA: Diagnosis not present

## 2023-10-17 DIAGNOSIS — M25511 Pain in right shoulder: Secondary | ICD-10-CM | POA: Diagnosis not present

## 2023-10-17 DIAGNOSIS — M1711 Unilateral primary osteoarthritis, right knee: Secondary | ICD-10-CM | POA: Diagnosis not present

## 2023-10-22 ENCOUNTER — Telehealth: Admitting: Physician Assistant

## 2023-10-22 DIAGNOSIS — R339 Retention of urine, unspecified: Secondary | ICD-10-CM

## 2023-10-22 NOTE — Patient Instructions (Signed)
 [name], thank you for joining Teena Shuck, PA-C for today's virtual visit.  While this provider is not your primary care provider (PCP), if your PCP is located in our provider database this encounter information will be shared with them immediately following your visit.   A [facility] MyChart account gives you access to today's visit and all your visits, tests, and labs performed at [facility]  click here if you don't have a [facility] MyChart account or go to [email or web address]  Consent: (Patient) [name] provided verbal consent for this virtual visit at the beginning of the encounter.  Current Medications:  Current Outpatient Medications:    AMBULATORY NON FORMULARY MEDICATION, Medication Name: Diltiazem 2% gel:Lidocaine  5% - Using your index finger, you should apply a small amount of medication inside the rectum up to your first knuckle/joint 3-4 times daily x 6-8 weeks. (Patient taking differently: Apply 1 Application topically daily as needed. Medication Name: Diltiazem 2% gel:Lidocaine  5% - Using your index finger, you should apply a small amount of medication inside the rectum up to your first knuckle/joint 3-4 times daily x 6-8 weeks.), Disp: 45 g, Rfl: 2   amLODipine  (NORVASC ) 10 MG tablet, TAKE 1 TABLET(10 MG) BY MOUTH DAILY, Disp: 90 tablet, Rfl: 3   amoxicillin -clavulanate (AUGMENTIN ) 875-125 MG tablet, Take 1 tablet by mouth 2 (two) times daily., Disp: 20 tablet, Rfl: 0   Aromatic Inhalants (VICKS VAPOINHALER IN), Inhale 1 puff into the lungs daily as needed (congestion)., Disp: , Rfl:    ascorbic acid  (VITAMIN C ) 500 MG tablet, Take 500 mg by mouth daily., Disp: , Rfl:    aspirin  EC 81 MG tablet, Take 81 mg by mouth daily., Disp: , Rfl:    aspirin -sod bicarb-citric acid (ALKA-SELTZER) 325 MG TBEF tablet, Take 650 mg by mouth every 6 (six) hours as needed (indigestion)., Disp: , Rfl:    Blood Glucose Monitoring Suppl (ONE TOUCH ULTRA 2)  w/Device KIT, Use as directed daily E11.9, Disp: 1 each, Rfl: 0   Cholecalciferol  (VITAMIN D3 PO), Take 1 capsule by mouth daily., Disp: , Rfl:    citalopram  (CELEXA ) 40 MG tablet, TAKE 1 TABLET(40 MG) BY MOUTH DAILY, Disp: 90 tablet, Rfl: 3   Continuous Blood Gluc Receiver (DEXCOM G7 RECEIVER) DEVI, Use as directed once per day E11.9, Disp: 1 each, Rfl: 0   Continuous Blood Gluc Sensor (DEXCOM G7 SENSOR) MISC, Use as directed once every 2 weeks E11.9, Disp: 6 each, Rfl: 3   cyclobenzaprine  (FLEXERIL ) 10 MG tablet, Take 1 tablet (10 mg total) by mouth 3 (three) times daily as needed for muscle spasms., Disp: 30 tablet, Rfl: 2   diclofenac  Sodium (VOLTAREN ) 1 % GEL, Apply 4 g topically 4 (four) times daily as needed (Pain)., Disp: , Rfl:    fenofibrate  (TRICOR ) 145 MG tablet, TAKE 1 TABLET(145 MG) BY MOUTH DAILY, Disp: 90 tablet, Rfl: 3   fluticasone  (FLONASE ) 50 MCG/ACT nasal spray, Place 2 sprays into both nostrils daily. (Patient taking differently: Place 2 sprays into both nostrils daily as needed for allergies.), Disp: 16 g, Rfl: 6   gabapentin  (NEURONTIN ) 300 MG capsule, Take 900 mg by mouth 3 (three) times daily., Disp: , Rfl:    glucose blood test strip, Use as instructed once daily E119, Disp: 100 each, Rfl: 12   hydrochlorothiazide  (MICROZIDE ) 12.5 MG capsule, Take 1 capsule (12.5 mg total) by mouth daily. (Patient taking differently: Take 12.5 mg by mouth daily as needed (Edema).), Disp: 90 capsule, Rfl:  3   iron  polysaccharides (NU-IRON ) 150 MG capsule, Take 1 capsule (150 mg total) by mouth daily., Disp: 90 capsule, Rfl: 1   Lancets MISC, Use as directed daily E11.9, Disp: 100 each, Rfl: 11   lisinopril  (ZESTRIL ) 40 MG tablet, TAKE 1 TABLET(40 MG) BY MOUTH DAILY, Disp: 90 tablet, Rfl: 3   loratadine  (CLARITIN ) 10 MG tablet, TAKE 1 TABLET(10 MG) BY MOUTH DAILY AS NEEDED FOR ALLERGIES, Disp: 90 tablet, Rfl: 3   Magnesium  500 MG TABS, Take 500 mg by mouth daily., Disp: , Rfl:    omeprazole   (PRILOSEC) 40 MG capsule, Take 1 capsule (40 mg total) by mouth daily., Disp: 90 capsule, Rfl: 3   oxyCODONE  (ROXICODONE ) 15 MG immediate release tablet, Take 1 tablet (15 mg total) by mouth every 4 (four) hours as needed for severe pain., Disp: 30 tablet, Rfl: 0   PERCOCET 10-325 MG tablet, Take 1 tablet by mouth every 6 (six) hours., Disp: , Rfl:    Phenylephrine -Acetaminophen  (TYLENOL  SINUS CONGESTION/PAIN PO), Take 2 tablets by mouth 3 (three) times daily as needed (congestion)., Disp: , Rfl:    Potassium Chloride  ER 20 MEQ TBCR, TAKE 1 TABLET BY MOUTH DAILY, Disp: 90 tablet, Rfl: 3   predniSONE  (DELTASONE ) 10 MG tablet, 3 tabs by mouth per day for 3 days,2tabs per day for 3 days,1tab per day for 3 days, Disp: 18 tablet, Rfl: 0   rosuvastatin  (CRESTOR ) 20 MG tablet, TAKE 1 TABLET(20 MG) BY MOUTH DAILY, Disp: 90 tablet, Rfl: 3   tadalafil  (CIALIS ) 20 MG tablet, Take 0.5-1 tablets (10-20 mg total) by mouth every other day as needed for erectile dysfunction., Disp: 10 tablet, Rfl: 11   tamsulosin  (FLOMAX ) 0.4 MG CAPS capsule, TAKE 1 CAPSULE(0.4 MG) BY MOUTH DAILY, Disp: 90 capsule, Rfl: 3   tirzepatide  (MOUNJARO ) 10 MG/0.5ML Pen, Inject 10 mg into the skin once a week., Disp: 6 mL, Rfl: 5   tiZANidine  (ZANAFLEX ) 4 MG tablet, Take 4 mg by mouth 3 (three) times daily as needed for muscle spasms., Disp: , Rfl:    tolterodine  (DETROL  LA) 4 MG 24 hr capsule, Take 1 capsule (4 mg total) by mouth daily. (Patient taking differently: Take 4 mg by mouth daily as needed (Urgency).), Disp: 90 capsule, Rfl: 3   varenicline  (CHANTIX  CONTINUING MONTH PAK) 1 MG tablet, Take 1 tablet (1 mg total) by mouth 2 (two) times daily., Disp: 60 tablet, Rfl: 2   Varenicline  Tartrate, Starter, (CHANTIX  STARTING MONTH PAK) 0.5 MG X 11 & 1 MG X 42 TBPK, 1 tab by mouth once daily for 7 days, then twice per day after, Disp: 53 each, Rfl: 0   vitamin B-12 (CYANOCOBALAMIN ) 1000 MCG tablet, Take 1 tablet (1,000 mcg total) by mouth  daily., Disp: 90 tablet, Rfl: 1   zinc  gluconate 50 MG tablet, Take 50 mg by mouth daily., Disp: , Rfl:    Medications ordered in this encounter:  No orders of the defined types were placed in this encounter.    *If you need refills on other medications prior to your next appointment, please contact your pharmacy*  Follow-Up: Call back or seek an in-person evaluation if the symptoms worsen or if the condition fails to improve as anticipated.  [facility] [phone]  Other Instructions Please report to the nearest Emergency room with any worsening symptoms. Follow up with primary care provider (PCP) in 2 -3 days.    If you have been instructed to have an in-person evaluation today  at a local Urgent Care facility, please use the link below. It will take you to a list of all of our available [facility], including address, phone number and hours of operation. Please do not delay care.  [facility]  If you or a family member do not have a primary care provider, use the link below to schedule a visit and establish care. When you choose a [facility] primary care physician or advanced practice provider, you gain a long-term partner in health. Find a Primary Care Provider  Learn more about [facility]'s in-office and virtual care options:  - Get Care Now

## 2023-10-22 NOTE — Progress Notes (Signed)
 Virtual Visit Consent   Harold [name], you are scheduled for a virtual visit with a [facility] provider today. Just as with appointments in the office, your consent must be obtained to participate. Your consent will be active for this visit and any virtual visit you may have with one of our providers in the next 365 days. If you have a MyChart account, a copy of this consent can be sent to you electronically.  As this is a virtual visit, video technology does not allow for your provider to perform a traditional examination. This may limit your provider's ability to fully assess your condition. If your provider identifies any concerns that need to be evaluated in person or the need to arrange testing (such as labs, EKG, etc.), we will make arrangements to do so. Although advances in technology are sophisticated, we cannot ensure that it will always work on either your end or our end. If the connection with a video visit is poor, the visit may have to be switched to a telephone visit. With either a video or telephone visit, we are not always able to ensure that we have a secure connection.  By engaging in this virtual visit, you consent to the provision of healthcare and authorize for your insurance to be billed (if applicable) for the services provided during this visit. Depending on your insurance coverage, you may receive a charge related to this service.  I need to obtain your verbal consent now. Are you willing to proceed with your visit today? Harold [name] has provided verbal consent on 10/22/2023 for a virtual visit (video or telephone). Teena Shuck, [place]  Date: 10/22/2023 9:22 AM   Virtual Visit via Video Note   I, Teena Shuck, connected with  Harold [name]  ([id number], 1966/03/03) on 10/22/23 at  9:15 AM EDT by a video-enabled telemedicine application and verified that I am speaking with the correct person using two identifiers.  Location: Patient: Virtual Visit Location Patient:  Home Provider: Virtual Visit Location Provider: Home Office   I discussed the limitations of evaluation and management by telemedicine and the availability of in person appointments. The patient expressed understanding and agreed to proceed.    History of Present Illness: Harold [name] is a 58 y.o. who identifies as a male who was assigned male at birth, and is being seen today for diarrhea and trouble urinating.   HPI: HPI  Problems:  Patient Active Problem List   Diagnosis Date Noted   Facial rash 01/22/2023   Sebaceous cyst of axilla 01/22/2023   Allergies 01/22/2023   History of operative procedure on lumbosacral spinal structure 01/19/2023   Dysuria 07/19/2022   BPH (benign prostatic hyperplasia) 04/05/2022   Sinusitis 04/05/2022   Pseudoarthrosis of lumbar spine 06/11/2021   Opioid dependence (HCC) 05/27/2021   Nasal turbinate hypertrophy 05/27/2021   Lumbar pseudoarthrosis 05/27/2021   Infrapatellar bursitis of right knee 05/27/2021   Deviated nasal septum 05/27/2021   Arthritis of right knee 05/02/2021   Constipation 03/11/2021   Hematochezia 10/14/2020   OAB (overactive bladder) 10/12/2020   Hypokalemia 10/01/2020   Leucocytosis 10/01/2020   Cervical disc disease with myelopathy 09/30/2020   Upper extremity weakness 09/27/2020   Cervical myelopathy (HCC) 09/27/2020   HNP (herniated nucleus pulposus) with myelopathy, cervical 09/14/2020   Cheilitis 07/04/2020   HNP (herniated nucleus pulposus), lumbar 01/08/2020   Type 2 diabetes mellitus with diabetic neuropathy, unspecified (HCC) 09/20/2019   Right knee pain 09/20/2019   Chronic low  back pain 08/06/2019   Recurrent falls 08/06/2019   Educated about COVID-19 virus infection 07/11/2019   Lumbar post-laminectomy syndrome 04/10/2019   Allergic rhinitis 02/13/2018   Sinus congestion 12/19/2016   Thrush 08/02/2016   Lumbosacral spondylosis without myelopathy 07/05/2016   Sleep disturbance 07/04/2016   Epidural  lipomatosis 08/26/2015   Itching of male genitalia 07/24/2015   Trichomonas exposure 07/24/2015   Type 2 diabetes mellitus (HCC) 03/04/2015   Verruca vulgaris 02/24/2015   Nocturia 02/17/2015   Accessory skin tags 02/17/2015   Acute sinusitis 06/04/2014   Lumbar radiculopathy, acute 05/23/2014   Acute left-sided back pain with sciatica 04/10/2014   Smoker 04/10/2014   Cough 01/01/2014   Grief reaction 11/19/2013   Primary localized osteoarthrosis, lower leg 09/04/2013   Chronic pain syndrome 07/11/2013   Hyperlipidemia 07/11/2013   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/09/2013   Gonalgia 11/29/2012   Encounter for well adult exam with abnormal findings 09/14/2012   DDD (degenerative disc disease), lumbar 09/14/2012   Left sided sciatica 09/14/2012   Erectile dysfunction 09/14/2012   Pre-ulcerative corn or callous 09/14/2012   Hemorrhoids 07/26/2011   Chronic back pain greater than 3 months duration 09/23/2010   Abnormal EKG 08/12/2010   OSA (obstructive sleep apnea) 08/09/2010   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   Edema 06/10/2010   Acute idiopathic pericarditis 04/08/2010   NONSPECIFIC ABNORMAL ELECTROCARDIOGRAM 04/08/2010   HYPERTRIGLYCERIDEMIA 05/26/2009   SHOULDER PAIN, LEFT 04/21/2008   Depression 03/11/2008   Essential hypertension 03/11/2008   METABOLIC SYNDROME X 10/24/2006   OBESITY NOS 10/24/2006    Allergies:  Allergies  Allergen Reactions   Beta Adrenergic Blockers Other (See Comments)    Erectile dysfxn   Diphenhydramine Palpitations and Other (See Comments)    jittery Pt states he can take Benadryl in an emergency situation   Colchicine Diarrhea   Lipitor Arie.[email or web address] ] Other (See Comments)    Muscle cramps   Pecan Nut (Diagnostic) Palpitations    Pecans: itching and swelling of the tongue    Septra  [Sulfamethoxazole -Trimethoprim ] Itching   Medications:  Current Outpatient Medications:    AMBULATORY NON FORMULARY MEDICATION, Medication Name:  Diltiazem 2% gel:Lidocaine  5% - Using your index finger, you should apply a small amount of medication inside the rectum up to your first knuckle/joint 3-4 times daily x 6-8 weeks. (Patient taking differently: Apply 1 Application topically daily as needed. Medication Name: Diltiazem 2% gel:Lidocaine  5% - Using your index finger, you should apply a small amount of medication inside the rectum up to your first knuckle/joint 3-4 times daily x 6-8 weeks.), Disp: 45 g, Rfl: 2   amLODipine  (NORVASC ) 10 MG tablet, TAKE 1 TABLET(10 MG) BY MOUTH DAILY, Disp: 90 tablet, Rfl: 3   amoxicillin -clavulanate (AUGMENTIN ) 875-125 MG tablet, Take 1 tablet by mouth 2 (two) times daily., Disp: 20 tablet, Rfl: 0   Aromatic Inhalants (VICKS VAPOINHALER IN), Inhale 1 puff into the lungs daily as needed (congestion)., Disp: , Rfl:    ascorbic acid  (VITAMIN C ) 500 MG tablet, Take 500 mg by mouth daily., Disp: , Rfl:    aspirin  EC 81 MG tablet, Take 81 mg by mouth daily., Disp: , Rfl:    aspirin -sod bicarb-citric acid (ALKA-SELTZER) 325 MG TBEF tablet, Take 650 mg by mouth every 6 (six) hours as needed (indigestion)., Disp: , Rfl:    Blood Glucose Monitoring Suppl (ONE TOUCH ULTRA 2) w/Device KIT, Use as directed daily E11.9, Disp: 1 each, Rfl: 0   Cholecalciferol  (VITAMIN D3 PO), Take 1 capsule by mouth daily.,  Disp: , Rfl:    citalopram  (CELEXA ) 40 MG tablet, TAKE 1 TABLET(40 MG) BY MOUTH DAILY, Disp: 90 tablet, Rfl: 3   Continuous Blood Gluc Receiver (DEXCOM G7 RECEIVER) DEVI, Use as directed once per day E11.9, Disp: 1 each, Rfl: 0   Continuous Blood Gluc Sensor (DEXCOM G7 SENSOR) MISC, Use as directed once every 2 weeks E11.9, Disp: 6 each, Rfl: 3   cyclobenzaprine  (FLEXERIL ) 10 MG tablet, Take 1 tablet (10 mg total) by mouth 3 (three) times daily as needed for muscle spasms., Disp: 30 tablet, Rfl: 2   diclofenac  Sodium (VOLTAREN ) 1 % GEL, Apply 4 g topically 4 (four) times daily as needed (Pain)., Disp: , Rfl:     fenofibrate  (TRICOR ) 145 MG tablet, TAKE 1 TABLET(145 MG) BY MOUTH DAILY, Disp: 90 tablet, Rfl: 3   fluticasone  (FLONASE ) 50 MCG/ACT nasal spray, Place 2 sprays into both nostrils daily. (Patient taking differently: Place 2 sprays into both nostrils daily as needed for allergies.), Disp: 16 g, Rfl: 6   gabapentin  (NEURONTIN ) 300 MG capsule, Take 900 mg by mouth 3 (three) times daily., Disp: , Rfl:    glucose blood test strip, Use as instructed once daily E119, Disp: 100 each, Rfl: 12   hydrochlorothiazide  (MICROZIDE ) 12.5 MG capsule, Take 1 capsule (12.5 mg total) by mouth daily. (Patient taking differently: Take 12.5 mg by mouth daily as needed (Edema).), Disp: 90 capsule, Rfl: 3   iron  polysaccharides (NU-IRON ) 150 MG capsule, Take 1 capsule (150 mg total) by mouth daily., Disp: 90 capsule, Rfl: 1   Lancets MISC, Use as directed daily E11.9, Disp: 100 each, Rfl: 11   lisinopril  (ZESTRIL ) 40 MG tablet, TAKE 1 TABLET(40 MG) BY MOUTH DAILY, Disp: 90 tablet, Rfl: 3   loratadine  (CLARITIN ) 10 MG tablet, TAKE 1 TABLET(10 MG) BY MOUTH DAILY AS NEEDED FOR ALLERGIES, Disp: 90 tablet, Rfl: 3   Magnesium  500 MG TABS, Take 500 mg by mouth daily., Disp: , Rfl:    omeprazole  (PRILOSEC) 40 MG capsule, Take 1 capsule (40 mg total) by mouth daily., Disp: 90 capsule, Rfl: 3   oxyCODONE  (ROXICODONE ) 15 MG immediate release tablet, Take 1 tablet (15 mg total) by mouth every 4 (four) hours as needed for severe pain., Disp: 30 tablet, Rfl: 0   PERCOCET 10-325 MG tablet, Take 1 tablet by mouth every 6 (six) hours., Disp: , Rfl:    Phenylephrine -Acetaminophen  (TYLENOL  SINUS CONGESTION/PAIN PO), Take 2 tablets by mouth 3 (three) times daily as needed (congestion)., Disp: , Rfl:    Potassium Chloride  ER 20 MEQ TBCR, TAKE 1 TABLET BY MOUTH DAILY, Disp: 90 tablet, Rfl: 3   predniSONE  (DELTASONE ) 10 MG tablet, 3 tabs by mouth per day for 3 days,2tabs per day for 3 days,1tab per day for 3 days, Disp: 18 tablet, Rfl: 0    rosuvastatin  (CRESTOR ) 20 MG tablet, TAKE 1 TABLET(20 MG) BY MOUTH DAILY, Disp: 90 tablet, Rfl: 3   tadalafil  (CIALIS ) 20 MG tablet, Take 0.5-1 tablets (10-20 mg total) by mouth every other day as needed for erectile dysfunction., Disp: 10 tablet, Rfl: 11   tamsulosin  (FLOMAX ) 0.4 MG CAPS capsule, TAKE 1 CAPSULE(0.4 MG) BY MOUTH DAILY, Disp: 90 capsule, Rfl: 3   tirzepatide  (MOUNJARO ) 10 MG/0.5ML Pen, Inject 10 mg into the skin once a week., Disp: 6 mL, Rfl: 5   tiZANidine  (ZANAFLEX ) 4 MG tablet, Take 4 mg by mouth 3 (three) times daily as needed for muscle spasms., Disp: , Rfl:    tolterodine  (DETROL   LA) 4 MG 24 hr capsule, Take 1 capsule (4 mg total) by mouth daily. (Patient taking differently: Take 4 mg by mouth daily as needed (Urgency).), Disp: 90 capsule, Rfl: 3   varenicline  (CHANTIX  CONTINUING MONTH PAK) 1 MG tablet, Take 1 tablet (1 mg total) by mouth 2 (two) times daily., Disp: 60 tablet, Rfl: 2   Varenicline  Tartrate, Starter, (CHANTIX  STARTING MONTH PAK) 0.5 MG X 11 & 1 MG X 42 TBPK, 1 tab by mouth once daily for 7 days, then twice per day after, Disp: 53 each, Rfl: 0   vitamin B-12 (CYANOCOBALAMIN ) 1000 MCG tablet, Take 1 tablet (1,000 mcg total) by mouth daily., Disp: 90 tablet, Rfl: 1   zinc  gluconate 50 MG tablet, Take 50 mg by mouth daily., Disp: , Rfl:   Observations/Objective: Patient is well-developed, well-nourished in no acute distress.  Resting comfortably  at home.  Head is normocephalic, atraumatic.  No labored breathing.  Speech is clear and coherent with logical content.  Patient is alert and oriented at baseline.    Assessment and Plan: 1. Urinary retention (Primary)  Patient unable to void standing and has little progress with sitting. Acute symptoms and is not his baseline. Recommended patient emergently present to ER for evaluation. This is not appropriate for telehealth. No immediate emergent life threatening issue noted during call. Vitals stable and patient  without acute distress.   Follow Up Instructions: I discussed the assessment and treatment plan with the patient. The patient was provided an opportunity to ask questions and all were answered. The patient agreed with the plan and demonstrated an understanding of the instructions.  A copy of instructions were sent to the patient via MyChart unless otherwise noted below.    The patient was advised to call back or seek an in-person evaluation if the symptoms worsen or if the condition fails to improve as anticipated.    Teena Shuck, PA-C

## 2023-10-22 NOTE — Addendum Note (Signed)
 Addended byBETHA ROLAN BERTHOLD on: 10/22/2023 09:27 AM   Modules accepted: Level of Service

## 2023-10-30 ENCOUNTER — Ambulatory Visit (INDEPENDENT_AMBULATORY_CARE_PROVIDER_SITE_OTHER)

## 2023-10-30 VITALS — Ht 71.0 in | Wt 247.0 lb

## 2023-10-30 DIAGNOSIS — Z0000 Encounter for general adult medical examination without abnormal findings: Secondary | ICD-10-CM | POA: Diagnosis not present

## 2023-10-30 NOTE — Progress Notes (Signed)
 Subjective:   Harold [name] is a 58 y.o. who presents for a Medicare Wellness preventive visit.  As a reminder, Annual Wellness Visits don't include a physical exam, and some assessments may be limited, especially if this visit is performed virtually. We may recommend an in-person follow-up visit with your provider if needed.  Visit Complete: Virtual I connected with  Harold [name] on 10/30/23 by a audio enabled telemedicine application and verified that I am speaking with the correct person using two identifiers.  Patient Location: Home  Provider Location: Home Office  I discussed the limitations of evaluation and management by telemedicine. The patient expressed understanding and agreed to proceed.  Vital Signs: Because this visit was a virtual/telehealth visit, some criteria may be missing or patient reported. Any vitals not documented were not able to be obtained and vitals that have been documented are patient reported.  VideoDeclined- This patient declined Librarian, academic. Therefore the visit was completed with audio only.  Persons Participating in Visit: Patient.  AWV Questionnaire: No: Patient Medicare AWV questionnaire was not completed prior to this visit.  Cardiac Risk Factors include: advanced age (>11men, >52 women);male gender;hypertension;Other (see comment);diabetes mellitus, Risk factor comments: OSA, BPH     Objective:    Today's Vitals   10/30/23 1322 10/30/23 1323  Weight: 247 lb (112 kg)   Height: 5' 11 (1.803 m)   PainSc:  7    Body mass index is 34.45 kg/m.     10/30/2023    1:37 PM 06/15/2023    9:49 AM 02/15/2023   12:43 PM 02/07/2023   12:32 PM 07/12/2022    2:55 AM 07/11/2022    9:38 PM 05/13/2022    2:58 PM  Advanced Directives  Does Patient Have a Medical Advance Directive? No No No No No No No  Would patient like information on creating a medical advance directive?  No - Patient declined No - Patient  declined No - Patient declined  No - Patient declined No - Patient declined    Current Medications (verified) Outpatient Encounter Medications as of 10/30/2023  Medication Sig   AMBULATORY NON FORMULARY MEDICATION Medication Name: Diltiazem 2% gel:Lidocaine  5% - Using your index finger, you should apply a small amount of medication inside the rectum up to your first knuckle/joint 3-4 times daily x 6-8 weeks. (Patient taking differently: Apply 1 Application topically daily as needed. Medication Name: Diltiazem 2% gel:Lidocaine  5% - Using your index finger, you should apply a small amount of medication inside the rectum up to your first knuckle/joint 3-4 times daily x 6-8 weeks.)   amLODipine  (NORVASC ) 10 MG tablet TAKE 1 TABLET(10 MG) BY MOUTH DAILY   amoxicillin -clavulanate (AUGMENTIN ) 875-125 MG tablet Take 1 tablet by mouth 2 (two) times daily.   Aromatic Inhalants (VICKS VAPOINHALER IN) Inhale 1 puff into the lungs daily as needed (congestion).   ascorbic acid  (VITAMIN C ) 500 MG tablet Take 500 mg by mouth daily.   aspirin  EC 81 MG tablet Take 81 mg by mouth daily.   aspirin -sod bicarb-citric acid (ALKA-SELTZER) 325 MG TBEF tablet Take 650 mg by mouth every 6 (six) hours as needed (indigestion).   Blood Glucose Monitoring Suppl (ONE TOUCH ULTRA 2) w/Device KIT Use as directed daily E11.9   Cholecalciferol  (VITAMIN D3 PO) Take 1 capsule by mouth daily.   citalopram  (CELEXA ) 40 MG tablet TAKE 1 TABLET(40 MG) BY MOUTH DAILY   Continuous Blood Gluc Receiver (DEXCOM G7 RECEIVER) DEVI Use as directed  once per day E11.9   Continuous Blood Gluc Sensor (DEXCOM G7 SENSOR) MISC Use as directed once every 2 weeks E11.9   cyclobenzaprine  (FLEXERIL ) 10 MG tablet Take 1 tablet (10 mg total) by mouth 3 (three) times daily as needed for muscle spasms.   diclofenac  Sodium (VOLTAREN ) 1 % GEL Apply 4 g topically 4 (four) times daily as needed (Pain).   fenofibrate  (TRICOR ) 145 MG tablet TAKE 1 TABLET(145 MG) BY  MOUTH DAILY   fluticasone  (FLONASE ) 50 MCG/ACT nasal spray Place 2 sprays into both nostrils daily. (Patient taking differently: Place 2 sprays into both nostrils daily as needed for allergies.)   gabapentin  (NEURONTIN ) 300 MG capsule Take 900 mg by mouth 3 (three) times daily.   glucose blood test strip Use as instructed once daily E119   hydrochlorothiazide  (MICROZIDE ) 12.5 MG capsule Take 1 capsule (12.5 mg total) by mouth daily. (Patient taking differently: Take 12.5 mg by mouth daily as needed (Edema).)   iron  polysaccharides (NU-IRON ) 150 MG capsule Take 1 capsule (150 mg total) by mouth daily.   Lancets MISC Use as directed daily E11.9   lisinopril  (ZESTRIL ) 40 MG tablet TAKE 1 TABLET(40 MG) BY MOUTH DAILY   loratadine  (CLARITIN ) 10 MG tablet TAKE 1 TABLET(10 MG) BY MOUTH DAILY AS NEEDED FOR ALLERGIES   Magnesium  500 MG TABS Take 500 mg by mouth daily.   omeprazole  (PRILOSEC) 40 MG capsule Take 1 capsule (40 mg total) by mouth daily.   PERCOCET 10-325 MG tablet Take 1 tablet by mouth every 6 (six) hours.   Phenylephrine -Acetaminophen  (TYLENOL  SINUS CONGESTION/PAIN PO) Take 2 tablets by mouth 3 (three) times daily as needed (congestion).   Potassium Chloride  ER 20 MEQ TBCR TAKE 1 TABLET BY MOUTH DAILY   predniSONE  (DELTASONE ) 10 MG tablet 3 tabs by mouth per day for 3 days,2tabs per day for 3 days,1tab per day for 3 days   rosuvastatin  (CRESTOR ) 20 MG tablet TAKE 1 TABLET(20 MG) BY MOUTH DAILY   tadalafil  (CIALIS ) 20 MG tablet Take 0.5-1 tablets (10-20 mg total) by mouth every other day as needed for erectile dysfunction.   tamsulosin  (FLOMAX ) 0.4 MG CAPS capsule TAKE 1 CAPSULE(0.4 MG) BY MOUTH DAILY   tirzepatide  (MOUNJARO ) 10 MG/0.5ML Pen Inject 10 mg into the skin once a week.   tiZANidine  (ZANAFLEX ) 4 MG tablet Take 4 mg by mouth 3 (three) times daily as needed for muscle spasms.   tolterodine  (DETROL  LA) 4 MG 24 hr capsule Take 1 capsule (4 mg total) by mouth daily. (Patient taking  differently: Take 4 mg by mouth daily as needed (Urgency).)   varenicline  (CHANTIX  CONTINUING MONTH PAK) 1 MG tablet Take 1 tablet (1 mg total) by mouth 2 (two) times daily.   Varenicline  Tartrate, Starter, (CHANTIX  STARTING MONTH PAK) 0.5 MG X 11 & 1 MG X 42 TBPK 1 tab by mouth once daily for 7 days, then twice per day after   vitamin B-12 (CYANOCOBALAMIN ) 1000 MCG tablet Take 1 tablet (1,000 mcg total) by mouth daily.   zinc  gluconate 50 MG tablet Take 50 mg by mouth daily.   oxyCODONE  (ROXICODONE ) 15 MG immediate release tablet Take 1 tablet (15 mg total) by mouth every 4 (four) hours as needed for severe pain.   No facility-administered encounter medications on file as of 10/30/2023.    Allergies (verified) Beta adrenergic blockers, Diphenhydramine, Colchicine, Lipitor [atorvastatin ], Pecan nut (diagnostic), and Septra  [sulfamethoxazole -trimethoprim ]   History: Past Medical History:  Diagnosis Date   Acute idiopathic pericarditis  04/08/2010   Qualifier: Diagnosis of  By: Lelon RIGGERS, [name]     Acute sinusitis 06/04/2014   Anxiety    CHF (congestive heart failure) (HCC) 06/29/2010   was pericardititis not CHF   Chronic back pain    Constipation    DDD (degenerative disc disease), lumbar 09/14/2012   Depression    pt denies   Erectile dysfunction 09/14/2012   GERD (gastroesophageal reflux disease)    ocassional   Gout, unspecified 05/01/2007   Qualifier: Diagnosis of  By: Krystal MD, Reyes LABOR    Headache    migraines in the past   History of pancreatitis    a. admx 04-2009.SABRASABRA? 2-2 triglycerides   Hypertension    Hypertriglyceridemia    a. followed by [facility]   Left sided sciatica 09/14/2012   Neuropathy    Obesity    OSA (obstructive sleep apnea)    CPAP- not current   Restless legs    Type 2 diabetes mellitus with diabetic neuropathy, unspecified (HCC) 09/20/2019   Past Surgical History:  Procedure Laterality Date   ANAL INTRAEPITHELIAL NEOPLASIA EXCISION Left  02/15/2023   Procedure: EXCISION SEBACEOUS CYST LEFT AXILLA;  Surgeon: Curvin Deward MOULD, MD;  Location: [facility];  Service: General;  Laterality: Left;   ANTERIOR CERVICAL DECOMP/DISCECTOMY FUSION N/A 09/14/2020   Procedure: Anterior Cervical Decompression Fusion - Cervical three-Cervical four;  Surgeon: [name], MD;  Location: [facility] OR;  Service: Neurosurgery;  Laterality: N/A;   BACK SURGERY  2021   lumbar fusion   COLONOSCOPY     ELBOW SURGERY Left    KNEE SURGERY Right    x3 scopes   KNEE SURGERY Right    open menisicus repair   LUMBAR LAMINECTOMY/DECOMPRESSION MICRODISCECTOMY N/A 08/26/2015   Procedure: Lumbar Two-Sacral One  Laminectomy for decompression;  Surgeon: Morene Hicks Ditty, MD;  Location: [facility];  Service: Neurosurgery;  Laterality: N/A;   pericardectomy  07/15/2010   Dr. Army   pleurx catheter placement  07/15/2010   Dr. Army   SHOULDER SURGERY     right and left shoulders- Rotator cuff repair   SHOULDER SURGERY Right 2023   TONSILLECTOMY AND ADENOIDECTOMY     one tonsil   Family History  Problem Relation Age of Onset   Diabetes Mother    Diabetes Father    Colon cancer Father 43   Heart disease Father 60       CABG   Pancreatitis Father    Hypertension Other        entire family   Liver cancer Paternal Uncle    Diabetes Sister    Stomach cancer Maternal Grandfather    Pancreatitis Paternal Uncle        x 2   Esophageal cancer Neg Hx    Rectal cancer Neg Hx    Colon polyps Neg Hx    Social History   Socioeconomic History   Marital status: Divorced    Spouse name: Not on file   Number of children: 1   Years of education: Not on file   Highest education level: Not on file  Occupational History   Occupation: disabled    Employer: UNEMPLOYED  Tobacco Use   Smoking status: Every Day    Current packs/day: 0.50    Average packs/day: 0.5 packs/day for 36.0 years (18.0 ttl pk-yrs)    Types: Cigarettes   Smokeless  tobacco: Former    Types: Snuff    Quit date: 2021  Vaping  Use   Vaping status: Former   Devices: Therapist, music and Sexual Activity   Alcohol use: Not Currently   Drug use: No   Sexual activity: Yes  Other Topics Concern   Not on file  Social History Narrative   Regular exercise- No   Rare caffeine use    Social Drivers of Corporate investment banker Strain: High Risk (10/30/2023)   Overall Financial Resource Strain (CARDIA)    Difficulty of Paying Living Expenses: Hard  Food Insecurity: Food Insecurity Present (10/30/2023)   Hunger Vital Sign    Worried About Running Out of Food in the Last Year: Sometimes true    Ran Out of Food in the Last Year: Sometimes true  Transportation Needs: No Transportation Needs (10/30/2023)   PRAPARE - Administrator, Civil Service (Medical): No    Lack of Transportation (Non-Medical): No  Physical Activity: Insufficiently Active (10/30/2023)   Exercise Vital Sign    Days of Exercise per Week: 3 days    Minutes of Exercise per Session: 30 min  Stress: No Stress Concern Present (10/30/2023)   [employer] of Occupational Health - Occupational Stress Questionnaire    Feeling of Stress: Not at all  Social Connections: Socially Isolated (10/30/2023)   Social Connection and Isolation Panel    Frequency of Communication with Friends and Family: More than three times a week    Frequency of Social Gatherings with Friends and Family: More than three times a week    Attends Religious Services: Never    Database administrator or Organizations: No    Attends Engineer, structural: Never    Marital Status: Divorced    Tobacco Counseling Ready to quit: Not Answered Counseling given: Not Answered    Clinical Intake:  Pre-visit preparation completed: Yes  Pain : 0-10 Pain Score: 7  Pain Type: Chronic pain Pain Location: Back (knee and neck) Pain Descriptors / Indicators: Aching, Crying Pain Onset: More than a month ago Pain  Frequency: Constant Pain Relieving Factors: Ibprofen/ perecet/gabapentin   Pain Relieving Factors: Ibprofen/ perecet/gabapentin   BMI - recorded: 34.45 Nutritional Status: BMI > 30  Obese Nutritional Risks: None Diabetes: Yes CBG done?: No Did pt. bring in CBG monitor from home?: No  Lab Results  Component Value Date   HGBA1C 6.1 05/05/2023   HGBA1C 6.1 01/19/2023   HGBA1C 6.4 07/19/2022     How often do you need to have someone help you when you read instructions, pamphlets, or other written materials from your doctor or pharmacy?: 1 - Never  Interpreter Needed?: No  Information entered by :: Debrah Granderson, RMA   Activities of Daily Living     10/30/2023    1:23 PM 06/15/2023    9:52 AM  In your present state of health, do you have any difficulty performing the following activities:  Hearing? 0   Vision? 0   Difficulty concentrating or making decisions? 0   Walking or climbing stairs? 0   Dressing or bathing? 0   Doing errands, shopping? 0 0  Preparing Food and eating ? N   Using the Toilet? N   In the past six months, have you accidently leaked urine? N   Do you have problems with loss of bowel control? N   Managing your Medications? N   Managing your Finances? N   Housekeeping or managing your Housekeeping? N     Patient Care Team: Norleen Lynwood ORN, MD as PCP - General (Internal  Medicine) Lavona Agent, MD as PCP - Cardiology (Cardiology) Sheril Coy, MD as Consulting Physician (Orthopedic Surgery)  I have updated your Care Teams any recent Medical Services you may have received from other providers in the past year.     Assessment:   This is a routine wellness examination for Harold [name].  Hearing/Vision screen Hearing Screening - Comments:: Denies hearing difficulties   Vision Screening - Comments:: Wear eyeglasses/   Goals Addressed   None    Depression Screen     10/30/2023    1:41 PM 05/05/2023    2:49 PM 07/19/2022    8:17 AM 04/05/2022    2:56 PM  04/28/2021    2:25 PM 04/09/2020    3:51 PM 08/06/2019    4:40 PM  PHQ 2/9 Scores  PHQ - 2 Score 0 0 1 0 0 0 0  PHQ- 9 Score 0  4        Fall Risk     10/30/2023    1:37 PM 05/05/2023    2:57 PM 07/19/2022    8:17 AM 04/05/2022    2:56 PM 04/09/2020    3:50 PM  Fall Risk   Falls in the past year? 1 0 1 0 1  Number falls in past yr: 1 0 1  1  Injury with Fall? 1 0 0 0 1  Comment right side xray    pt has problemtaic knee and lower back  Risk for fall due to : Impaired balance/gait No Fall Risks History of fall(s) No Fall Risks   Follow up Falls evaluation completed;Falls prevention discussed Falls evaluation completed Falls evaluation completed Falls evaluation completed       Data saved with a previous flowsheet row definition    MEDICARE RISK AT HOME:  Medicare Risk at Home Any stairs in or around the home?: Yes If so, are there any without handrails?: No Home free of loose throw rugs in walkways, pet beds, electrical cords, etc?: Yes Adequate lighting in your home to reduce risk of falls?: Yes Life alert?: No Use of a cane, walker or w/c?: Yes (cane and walker) Grab bars in the bathroom?: No Shower chair or bench in shower?: Yes Elevated toilet seat or a handicapped toilet?: Yes  TIMED UP AND GO:  Was the test performed?  No  Cognitive Function: Declined/Normal: No cognitive concerns noted by patient or family. Patient alert, oriented, able to answer questions appropriately and recall recent events. No signs of memory loss or confusion.        Immunizations Immunization History  Administered Date(s) Administered   Hepatitis B 03/29/2003, 06/27/2003, 09/26/2003   Influenza Split 01/27/2011   Influenza, Seasonal, Injecte, Preservative Fre 01/19/2023   Influenza,inj,Quad PF,6+ Mos 03/08/2013, 11/19/2013, 01/21/2015, 12/20/2016, 01/26/2018, 01/07/2019   Influenza-Unspecified 11/21/2018, 12/26/2019   PFIZER(Purple Top)SARS-COV-2 Vaccination 06/08/2019, 06/29/2019    PNEUMOCOCCAL CONJUGATE-20 10/12/2020   Pneumococcal Polysaccharide-23 03/24/2010, 03/04/2015   Td 03/28/2001   Tdap 09/14/2012, 10/05/2017    Screening Tests Health Maintenance  Topic Date Due   Zoster Vaccines- Shingrix (1 of 2) Never done   COVID-19 Vaccine (3 - Pfizer risk series) 07/27/2019   OPHTHALMOLOGY EXAM  06/06/2023   INFLUENZA VACCINE  10/27/2023   HEMOGLOBIN A1C  11/02/2023   Diabetic kidney evaluation - Urine ACR  05/04/2024   FOOT EXAM  05/04/2024   Diabetic kidney evaluation - eGFR measurement  06/14/2024   Medicare Annual Wellness (AWV)  10/29/2024   DTaP/Tdap/Td (4 - Td or Tdap) 10/06/2027  Colonoscopy  04/26/2028   Pneumococcal Vaccine: 19-49 Years  Completed   Pneumococcal Vaccine: 50+ Years  Completed   Hepatitis B Vaccines  Completed   Hepatitis C Screening  Completed   HIV Screening  Completed   HPV VACCINES  Aged Out   Meningococcal B Vaccine  Aged Out    Health Maintenance  Health Maintenance Due  Topic Date Due   Zoster Vaccines- Shingrix (1 of 2) Never done   COVID-19 Vaccine (3 - Pfizer risk series) 07/27/2019   OPHTHALMOLOGY EXAM  06/06/2023   INFLUENZA VACCINE  10/27/2023   Health Maintenance Items Addressed: See Nurse Notes at the end of this note  Additional Screening:  Vision Screening: Recommended annual ophthalmology exams for early detection of glaucoma and other disorders of the eye. Would you like a referral to an eye doctor? No    Dental Screening: Recommended annual dental exams for proper oral hygiene  Community Resource Referral / Chronic Care Management: CRR required this visit?  No   CCM required this visit?  No   Plan:    I have personally reviewed and noted the following in the patient's chart:   Medical and social history Use of alcohol, tobacco or illicit drugs  Current medications and supplements including opioid prescriptions. Patient is currently taking opioid prescriptions. Information provided to  patient regarding non-opioid alternatives. Patient advised to discuss non-opioid treatment plan with their provider. Functional ability and status Nutritional status Physical activity Advanced directives List of other physicians Hospitalizations, surgeries, and ER visits in previous 12 months Vitals Screenings to include cognitive, depression, and falls Referrals and appointments  In addition, I have reviewed and discussed with patient certain preventive protocols, quality metrics, and best practice recommendations. A written personalized care plan for preventive services as well as general preventive health recommendations were provided to patient.   Tziporah Knoke L Glenice Ciccone, CMA   10/30/2023   After Visit Summary: (MyChart) Due to this being a telephonic visit, the after visit summary with patients personalized plan was offered to patient via MyChart   Notes: Patient is due for a Shingrix vaccine.  He stated that he has had a recent eye exam, however he can not remember the name of the dr. Or place he had exam performed.

## 2023-10-30 NOTE — Patient Instructions (Addendum)
 Harold [name] , Thank you for taking time out of your busy schedule to complete your Annual Wellness Visit with me. I enjoyed our conversation and look forward to speaking with you again next year. I, as well as your care team,  appreciate your ongoing commitment to your health goals. Please review the following plan we discussed and let me know if I can assist you in the future. Your Game plan/ To Do List      Follow up Visits: We will see or speak with you next year for your Next Medicare AWV with our clinical staff Have you seen your provider in the last 6 months (3 months if uncontrolled diabetes)? Yes  Clinician Recommendations:  Aim for 30 minutes of exercise or brisk walking, 6-8 glasses of water, and 5 servings of fruits and vegetables each day. You are due for a Shingrix vaccine and can get that done at your local pharmacy.  Please let us  know who you eye doctor is or where you went for your recently eye exam.       This is a list of the screenings recommended for you:  Health Maintenance  Topic Date Due   Medicare Annual Wellness Visit  Never done   Zoster (Shingles) Vaccine (1 of 2) Never done   COVID-19 Vaccine (3 - Pfizer risk series) 07/27/2019   Eye exam for diabetics  06/06/2023   Flu Shot  10/27/2023   Hemoglobin A1C  11/02/2023   Yearly kidney health urinalysis for diabetes  05/04/2024   Complete foot exam   05/04/2024   Yearly kidney function blood test for diabetes  06/14/2024   DTaP/Tdap/Td vaccine (4 - Td or Tdap) 10/06/2027   Colon Cancer Screening  04/26/2028   Pneumococcal Vaccine for high risk medical condition  Completed   Pneumococcal Vaccine for age over 48  Completed   Hepatitis B Vaccine  Completed   Hepatitis C Screening  Completed   HIV Screening  Completed   HPV Vaccine  Aged Out   Meningitis B Vaccine  Aged Out    Advanced directives: (Copy Requested) Please bring a copy of your health care power of attorney and living will to the office to be added  to your chart at your convenience. You can mail to [facility] [address] [place], [place] [address] or email to [email or web address] Advance Care Planning is important because it:  [x]  Makes sure you receive the medical care that is consistent with your values, goals, and preferences  [x]  It provides guidance to your family and loved ones and reduces their decisional burden about whether or not they are making the right decisions based on your wishes.  Follow the link provided in your after visit summary or read over the paperwork we have mailed to you to help you started getting your Advance Directives in place. If you need assistance in completing these, please reach out to us  so that we can help you!  See attachments for Preventive Care and Fall Prevention Tips.    Managing Pain Without Opioids Opioids are strong medicines used to treat moderate to severe pain. For some people, especially those who have long-term (chronic) pain, opioids may not be the best choice for pain management due to: Side effects like nausea, constipation, and sleepiness. The risk of addiction (opioid use disorder). The longer you take opioids, the greater your risk of addiction. Pain that lasts for more than 3 months is called chronic pain. Managing chronic pain  usually requires more than one approach and is often provided by a team of health care providers working together (multidisciplinary approach). Pain management may be done at a pain management center or pain clinic. How to manage pain without the use of opioids Use non-opioid medicines Non-opioid medicines for pain may include: Over-the-counter or prescription non-steroidal anti-inflammatory drugs (NSAIDs). These may be the first medicines used for pain. They work well for muscle and bone pain, and they reduce swelling. Acetaminophen . This over-the-counter medicine may work well for milder pain but not swelling. Antidepressants.  These may be used to treat chronic pain. A certain type of antidepressant (tricyclics) is often used. These medicines are given in lower doses for pain than when used for depression. Anticonvulsants. These are usually used to treat seizures but may also reduce nerve (neuropathic) pain. Muscle relaxants. These relieve pain caused by sudden muscle tightening (spasms). You may also use a pain medicine that is applied to the skin as a patch, cream, or gel (topical analgesic), such as a numbing medicine. These may cause fewer side effects than medicines taken by mouth. Do certain therapies as directed Some therapies can help with pain management. They include: Physical therapy. You will do exercises to gain strength and flexibility. A physical therapist may teach you exercises to move and stretch parts of your body that are weak, stiff, or painful. You can learn these exercises at physical therapy visits and practice them at home. Physical therapy may also involve: Massage. Heat wraps or applying heat or cold to affected areas. Electrical signals that interrupt pain signals (transcutaneous electrical nerve stimulation, TENS). Weak lasers that reduce pain and swelling (low-level laser therapy). Signals from your body that help you learn to regulate pain (biofeedback). Occupational therapy. This helps you to learn ways to function at home and work with less pain. Recreational therapy. This involves trying new activities or hobbies, such as a physical activity or drawing. Mental health therapy, including: Cognitive behavioral therapy (CBT). This helps you learn coping skills for dealing with pain. Acceptance and commitment therapy (ACT) to change the way you think and react to pain. Relaxation therapies, including muscle relaxation exercises and mindfulness-based stress reduction. Pain management counseling. This may be individual, family, or group counseling.  Receive medical treatments Medical  treatments for pain management include: Nerve block injections. These may include a pain blocker and anti-inflammatory medicines. You may have injections: Near the spine to relieve chronic back or neck pain. Into joints to relieve back or joint pain. Into nerve areas that supply a painful area to relieve body pain. Into muscles (trigger point injections) to relieve some painful muscle conditions. A medical device placed near your spine to help block pain signals and relieve nerve pain or chronic back pain (spinal cord stimulation device). Acupuncture. Follow these instructions at home Medicines Take over-the-counter and prescription medicines only as told by your health care provider. If you are taking pain medicine, ask your health care providers about possible side effects to watch out for. Do not drive or use heavy machinery while taking prescription opioid pain medicine. Lifestyle  Do not use drugs or alcohol to reduce pain. If you drink alcohol, limit how much you have to: 0-1 drink a day for women who are not pregnant. 0-2 drinks a day for men. Know how much alcohol is in a drink. In the [place] one drink equals one 12 oz bottle of beer (355 mL), one 5 oz glass of wine (148 mL), or one 1 oz  glass of hard liquor (44 mL). Do not use any products that contain nicotine or tobacco. These products include cigarettes, chewing tobacco, and vaping devices, such as e-cigarettes. If you need help quitting, ask your health care provider. Eat a healthy diet and maintain a healthy weight. Poor diet and excess weight may make pain worse. Eat foods that are high in fiber. These include fresh fruits and vegetables, whole grains, and beans. Limit foods that are high in fat and processed sugars, such as fried and sweet foods. Exercise regularly. Exercise lowers stress and may help relieve pain. Ask your health care provider what activities and exercises are safe for you. If your health care provider  approves, join an exercise class that combines movement and stress reduction. Examples include yoga and tai chi. Get enough sleep. Lack of sleep may make pain worse. Lower stress as much as possible. Practice stress reduction techniques as told by your therapist. General instructions Work with all your pain management providers to find the treatments that work best for you. You are an important member of your pain management team. There are many things you can do to reduce pain on your own. Consider joining an online or in-person support group for people who have chronic pain. Keep all follow-up visits. This is important. Where to find more information You can find more information about managing pain without opioids from: American Academy of Pain Medicine: painmed.org Institute for Chronic Pain: instituteforchronicpain.org American Chronic Pain Association: theacpa.org Contact a health care provider if: You have side effects from pain medicine. Your pain gets worse or does not get better with treatments or home therapy. You are struggling with anxiety or depression. Summary Many types of pain can be managed without opioids. Chronic pain may respond better to pain management without opioids. Pain is best managed when you and a team of health care providers work together. Pain management without opioids may include non-opioid medicines, medical treatments, physical therapy, mental health therapy, and lifestyle changes. Tell your health care providers if your pain gets worse or is not being managed well enough. This information is not intended to replace advice given to you by your health care provider. Make sure you discuss any questions you have with your health care provider. Document Revised: 06/24/2020 Document Reviewed: 06/24/2020 Elsevier Patient Education  2024 [employer].

## 2023-11-02 DIAGNOSIS — M542 Cervicalgia: Secondary | ICD-10-CM | POA: Diagnosis not present

## 2023-11-02 DIAGNOSIS — Z6833 Body mass index (BMI) 33.0-33.9, adult: Secondary | ICD-10-CM | POA: Diagnosis not present

## 2023-11-02 DIAGNOSIS — M48062 Spinal stenosis, lumbar region with neurogenic claudication: Secondary | ICD-10-CM | POA: Diagnosis not present

## 2023-11-02 DIAGNOSIS — M5412 Radiculopathy, cervical region: Secondary | ICD-10-CM | POA: Diagnosis not present

## 2023-11-02 DIAGNOSIS — G5600 Carpal tunnel syndrome, unspecified upper limb: Secondary | ICD-10-CM | POA: Diagnosis not present

## 2023-11-06 ENCOUNTER — Other Ambulatory Visit: Payer: Self-pay | Admitting: Neurosurgery

## 2023-11-06 ENCOUNTER — Other Ambulatory Visit: Payer: Self-pay

## 2023-11-06 DIAGNOSIS — M5412 Radiculopathy, cervical region: Secondary | ICD-10-CM

## 2023-11-06 DIAGNOSIS — R202 Paresthesia of skin: Secondary | ICD-10-CM

## 2023-11-08 ENCOUNTER — Encounter: Payer: Self-pay | Admitting: Neurosurgery

## 2023-11-10 ENCOUNTER — Ambulatory Visit (INDEPENDENT_AMBULATORY_CARE_PROVIDER_SITE_OTHER): Admitting: Neurology

## 2023-11-10 DIAGNOSIS — R202 Paresthesia of skin: Secondary | ICD-10-CM | POA: Diagnosis not present

## 2023-11-10 DIAGNOSIS — G5603 Carpal tunnel syndrome, bilateral upper limbs: Secondary | ICD-10-CM

## 2023-11-10 DIAGNOSIS — M5412 Radiculopathy, cervical region: Secondary | ICD-10-CM

## 2023-11-10 NOTE — Procedures (Signed)
 [facility]  [address], [address]  [place], [place] [address] Tel: [phone] Fax: [phone] Test Date:  11/10/2023  Patient: Harold [name] DOB: [birth year] Physician: Tonita Blanch, DO  Sex: Male Height: 5' 11 Ref Phys: Arley Helling, MD  ID#: [id number]   Technician:    History: This is a 58 year old man with history of ACDF at C3-C4 referred for evaluation of bilateral weakness, tingling, and numbness of the hands.  NCV & EMG Findings: Extensive electrodiagnostic testing of the right upper extremity and additional studies of the left shows:  Bilateral median sensory responses show prolonged latency (R5.1, L4.9 ms) and reduced amplitude (R7.3, L8.5 V).  Bilateral ulnar sensory responses are within normal limits. Bilateral median motor responses show prolonged latency (R5.2, L5.2 ms) with reduced amplitude on the right (5.7 mV).  Bilateral ulnar motor responses are within normal limits. Chronic motor axon loss changes are seen affecting bilateral C5-C6 myotomes as well as the right abductor pollicis brevis muscle.  There is no evidence of accompanying active denervation.  Impression: Bilateral median neuropathy at or distal to the wrist, consistent with a clinical diagnosis of carpal tunnel syndrome.  Overall, these findings are moderate-to-severe and worse on the right.  Chronic C5-C6 radiculopathy affecting bilateral upper extremities, moderate and worse on the left.   ___________________________ Tonita Blanch, DO    Nerve Conduction Studies   Stim Site NR Peak (ms) Norm Peak (ms) O-P Amp (V) Norm O-P Amp  Left Median Anti Sensory (2nd Digit)  32 C  Wrist    *4.9 <3.6 *8.5 >15  Right Median Anti Sensory (2nd Digit)  32 C  Wrist    *5.1 <3.6 *7.3 >15  Left Ulnar Anti Sensory (5th Digit)  32 C  Wrist    3.0 <3.1 18.2 >10  Right Ulnar Anti Sensory (5th Digit)  32 C  Wrist    3.0 <3.1 13.0 >10     Stim Site NR Onset (ms) Norm Onset (ms) O-P Amp (mV)  Norm O-P Amp Site1 Site2 Delta-0 (ms) Dist (cm) Vel (m/s) Norm Vel (m/s)  Left Median Motor (Abd Poll Brev)  32 C  Wrist    *5.2 <4.0 7.2 >6 Elbow Wrist 6.2 33.0 53 >50  Elbow    11.4  6.5         Right Median Motor (Abd Poll Brev)  32 C  Wrist    *5.2 <4.0 *5.7 >6 Elbow Wrist 5.7 34.0 60 >50  Elbow    10.9  5.3         Left Ulnar Motor (Abd Dig Minimi)  32 C  Wrist    2.4 <3.1 9.7 >7 B Elbow Wrist 4.6 24.0 52 >50  B Elbow    7.0  9.1  A Elbow B Elbow 1.8 10.0 56 >50  A Elbow    8.8  9.0         Right Ulnar Motor (Abd Dig Minimi)  32 C  Wrist    2.6 <3.1 10.4 >7 B Elbow Wrist 4.9 25.0 51 >50  B Elbow    7.5  10.0  A Elbow B Elbow 2.0 10.0 50 >50  A Elbow    9.5  9.7          Electromyography   Side Muscle Ins.Act Fibs Fasc Recrt Amp Dur Poly Activation Comment  Right 1stDorInt Nml Nml Nml Nml Nml Nml Nml Nml N/A  Right Abd Poll Brev Nml Nml Nml *1- *1+ *1+ *1+ Nml N/A  Right PronatorTeres Nml Nml Nml Nml Nml Nml Nml Nml N/A  Right Biceps Nml Nml Nml *1- *1+ *1+ *1+ Nml N/A  Right Triceps Nml Nml Nml Nml Nml Nml Nml Nml N/A  Right Deltoid Nml Nml Nml *2- *2+ *1+ *1+ Nml N/A  Left 1stDorInt Nml Nml Nml Nml Nml Nml Nml Nml N/A  Left Abd Poll Brev Nml Nml Nml Nml Nml Nml Nml Nml N/A  Left PronatorTeres Nml Nml Nml Nml Nml Nml Nml Nml N/A  Left Biceps Nml Nml Nml *2- *2+ *1+ *1+ Nml N/A  Left Triceps Nml Nml Nml Nml Nml Nml Nml Nml N/A  Left Deltoid Nml Nml Nml *2- *2+ *1+ *1+ Nml N/A      Waveforms:

## 2023-11-15 ENCOUNTER — Encounter: Payer: Self-pay | Admitting: Internal Medicine

## 2023-11-15 MED ORDER — DOXYCYCLINE HYCLATE 100 MG PO TABS: 100.0000 mg | ORAL_TABLET | Freq: Two times a day (BID) | ORAL | 0 refills | Status: DC

## 2023-11-15 MED ORDER — TIRZEPATIDE 12.5 MG/0.5ML ~~LOC~~ SOAJ
12.5000 mg | SUBCUTANEOUS | 3 refills | Status: AC
Start: 2023-11-15 — End: ?

## 2023-11-15 MED ORDER — LORATADINE 10 MG PO TABS: ORAL_TABLET | ORAL | 3 refills | Status: AC

## 2023-11-15 NOTE — Telephone Encounter (Signed)
 Already responded. thanks

## 2023-11-18 ENCOUNTER — Ambulatory Visit
Admission: RE | Admit: 2023-11-18 | Discharge: 2023-11-18 | Disposition: A | Discharge status: Home / Self Care | Source: Ambulatory Visit | Attending: Neurosurgery | Admitting: Neurosurgery

## 2023-11-18 DIAGNOSIS — M4802 Spinal stenosis, cervical region: Secondary | ICD-10-CM | POA: Diagnosis not present

## 2023-11-18 DIAGNOSIS — M47812 Spondylosis without myelopathy or radiculopathy, cervical region: Secondary | ICD-10-CM | POA: Diagnosis not present

## 2023-11-18 DIAGNOSIS — M5412 Radiculopathy, cervical region: Secondary | ICD-10-CM

## 2023-11-18 DIAGNOSIS — M50222 Other cervical disc displacement at C5-C6 level: Secondary | ICD-10-CM | POA: Diagnosis not present

## 2023-12-04 ENCOUNTER — Other Ambulatory Visit: Payer: Self-pay | Admitting: Neurosurgery

## 2023-12-06 ENCOUNTER — Encounter: Payer: Self-pay | Admitting: Physician Assistant

## 2023-12-06 ENCOUNTER — Ambulatory Visit (INDEPENDENT_AMBULATORY_CARE_PROVIDER_SITE_OTHER): Admitting: Physician Assistant

## 2023-12-06 VITALS — BP 133/86 | HR 62

## 2023-12-06 DIAGNOSIS — M5412 Radiculopathy, cervical region: Secondary | ICD-10-CM | POA: Diagnosis not present

## 2023-12-06 DIAGNOSIS — L811 Chloasma: Secondary | ICD-10-CM

## 2023-12-06 DIAGNOSIS — L700 Acne vulgaris: Secondary | ICD-10-CM

## 2023-12-06 DIAGNOSIS — G894 Chronic pain syndrome: Secondary | ICD-10-CM | POA: Diagnosis not present

## 2023-12-06 DIAGNOSIS — M542 Cervicalgia: Secondary | ICD-10-CM | POA: Diagnosis not present

## 2023-12-06 DIAGNOSIS — M48062 Spinal stenosis, lumbar region with neurogenic claudication: Secondary | ICD-10-CM | POA: Diagnosis not present

## 2023-12-06 MED ORDER — HYDROQUINONE 4 % EX CREA: TOPICAL_CREAM | Freq: Two times a day (BID) | CUTANEOUS | 1 refills | Status: DC

## 2023-12-06 MED ORDER — DOXYCYCLINE HYCLATE 100 MG PO TABS: 100.0000 mg | ORAL_TABLET | Freq: Two times a day (BID) | ORAL | 0 refills | Status: DC

## 2023-12-06 MED ORDER — DOXYCYCLINE HYCLATE 100 MG PO TABS
100.0000 mg | ORAL_TABLET | Freq: Two times a day (BID) | ORAL | 0 refills | Status: AC
Start: 2023-12-06 — End: ?

## 2023-12-06 MED ORDER — HYDROQUINONE 4 % EX CREA: TOPICAL_CREAM | CUTANEOUS | 1 refills | Status: AC

## 2023-12-06 NOTE — Progress Notes (Signed)
   New Patient Visit   Subjective  Harold [name] is a 58 y.o. male NEW PATIENT who presents for the following: spot check.  Patient states he  has some discoloration on his face that he was prescribed bleaching cream for many years ago that worked wonderful.   In addition, he has bumps on his back that he was prescribed oral doxycyline for which helped greatly. Would like a refill.      The following portions of the chart were reviewed this encounter and updated as appropriate: medications, allergies, medical history  Review of Systems:  No other skin or systemic complaints except as noted in HPI or Assessment and Plan.  Objective  Well appearing patient in no apparent distress; mood and affect are within normal limits.   A focused examination was performed of the following areas: face and back  Relevant exam findings are noted in the Assessment and Plan.             Assessment & Plan   ACNE VULGARIS -- BACK  Exam: resolving papules   Treatment Plan: - start doxycyline as directed  - wash with BP 10% wash daily    MELASMA Exam: reticulated hyperpigmented patches at face  Melasma is a chronic; persistent condition of hyperpigmented patches generally on the face, worse in summer due to higher UV exposure.    Heredity; thyroid  disease; sun exposure; pregnancy; birth control pills; epilepsy medication and darker skin may predispose to Melasma.   Recommendations include: - Sun avoidance and daily broad spectrum (UVA/UVB) tinted mineral sunscreen SPF 30+, with Zinc  or Titanium Dioxide. - Rx topical bleaching creams (i.e. hydroquinone ) is a common treatment but should not be used long term.  Hydroquinones may be mixed with retinoids; vitamin C ; steroids; Kojic Acid. - Alastin A-luminate, retinoids, vitamin C , topical tranexamic acid, glycolic acid and kojic acid can be used for brightening while on break from hydroquinone  - Rx Azelaic Acid is also a treatment option that  is safe for pregnancy (Category B). - OTC Heliocare can be helpful in control and prevention. - Oral Rx with Tranexamic Acid 250 mg - 650 mg po daily can be used for moderate to severe cases especially during summer (contraindications include pregnancy; lactation; hx of PE; hx of DVT; clotting disorder; heart disease; anticoagulant use and upcoming long trips)   - Chemical peels (would need multiple for best result).  - Lasers and  Microdermabrasion may also be helpful adjunct treatments.  Treatment Plan: - start topical hydroquinone  4% nightly for 3 months.  - Recommend daily broad spectrum (UVA/UVB) tinted mineral sunscreen SPF 30+, with Zinc  or Titanium Dioxide.     MELASMA   Related Medications hydroquinone  4 % cream Apply to affected areas nightly for 3 months - then discontinue. ACNE VULGARIS   Related Medications doxycycline  (VIBRA -TABS) 100 MG tablet Take 1 tablet (100 mg total) by mouth 2 (two) times daily. Take twice daily with food  Return in about 6 months (around 06/04/2024) for melasma follow up.  I, Doyce Pan, CMA, am acting as scribe for Nancye Grumbine K, PA-C.  Documentation: I have reviewed the above documentation for accuracy and completeness, and I agree with the above.  Athaliah Baumbach K, PA-C

## 2023-12-06 NOTE — Patient Instructions (Addendum)

## 2023-12-12 NOTE — Progress Notes (Signed)
 Surgical Instructions   Your procedure is scheduled on Monday, September 22nd, 2025. Report to [facility] Main Entrance A at 9:30 A.M., then check in with the Admitting office. Any questions or running late day of surgery: call [phone]  Questions prior to your surgery date: call [phone], Monday-Friday, 8am-4pm. If you experience any cold or flu symptoms such as cough, fever, chills, shortness of breath, etc. between now and your scheduled surgery, please notify us  at the above number.     Remember:  Do not eat or drink after midnight the night before your surgery    Take these medicines the morning of surgery with A SIP OF WATER: Amlodipine  (Norvasc ) Citalopram  (Celexa ) Doxycycline  (Vibra -tabs) Gabapentin  (Neurontin ) Omeprazole  (Prilosec) Rosuvastatin  (Crestor ) Tamsulosin  (Flomax ) Loratadine  (Claritin )    May take these medicines IF NEEDED: Fluticasone  (Flonase ) Percocet Tizanidine  (Zanaflex ) Tolterodine  (Detrol )   Follow your surgeon's instructions on when to stop your Aspirin , if no instructions received, please contact your surgeon's office.     One week prior to surgery, STOP taking any Aspirin  (unless otherwise instructed by your surgeon) Aleve, Naproxen, Ibuprofen , Motrin , Advil , Goody's, BC's, all herbal medications, fish oil, and non-prescription vitamins.  This includes Diclofenac  Sodium (Voltaren ) and Aspirin -sod bicarb-citric acid (Alka-Sletzer).    WHAT DO I DO ABOUT MY DIABETES MEDICATION?   Tirzepatide  (Mounjaro ) should be stopped 7 days prior to surgery.  Your last dose of Tirzepatide  (Mounjaro ) should be on or before Thursday, September 18th.    HOW TO MANAGE YOUR DIABETES BEFORE AND AFTER SURGERY  Why is it important to control my blood sugar before and after surgery? Improving blood sugar levels before and after surgery helps healing and can limit problems. A way of improving blood sugar control is eating a healthy diet by:  Eating  less sugar and carbohydrates  Increasing activity/exercise  Talking with your doctor about reaching your blood sugar goals High blood sugars (greater than 180 mg/dL) can raise your risk of infections and slow your recovery, so you will need to focus on controlling your diabetes during the weeks before surgery. Make sure that the doctor who takes care of your diabetes knows about your planned surgery including the date and location.  How do I manage my blood sugar before surgery? Check your blood sugar at least 4 times a day, starting 2 days before surgery, to make sure that the level is not too high or low.  Check your blood sugar the morning of your surgery when you wake up and every 2 hours until you get to the Short Stay unit.  If your blood sugar is less than 70 mg/dL, you will need to treat for low blood sugar: Do not take insulin . Treat a low blood sugar (less than 70 mg/dL) with  cup of clear juice (cranberry or apple), 4 glucose tablets, OR glucose gel. Recheck blood sugar in 15 minutes after treatment (to make sure it is greater than 70 mg/dL). If your blood sugar is not greater than 70 mg/dL on recheck, call [phone] for further instructions. Report your blood sugar to the short stay nurse when you get to Short Stay.  If you are admitted to the hospital after surgery: Your blood sugar will be checked by the staff and you will probably be given insulin  after surgery (instead of oral diabetes medicines) to make sure you have good blood sugar levels. The goal for blood sugar control after surgery is 80-180 mg/dL.  Do NOT Smoke (Tobacco/Vaping) for 24 hours prior to your procedure.  If you use a CPAP at night, you may bring your mask/headgear for your overnight stay.   You will be asked to remove any contacts, glasses, piercing's, hearing aid's, dentures/partials prior to surgery. Please bring cases for these items if needed.    Patients discharged the day  of surgery will not be allowed to drive home, and someone needs to stay with them for 24 hours.  SURGICAL WAITING ROOM VISITATION Patients may have no more than 2 support people in the waiting area - these visitors may rotate.   Pre-op nurse will coordinate an appropriate time for 1 ADULT support person, who may not rotate, to accompany patient in pre-op.  Children under the age of 52 must have an adult with them who is not the patient and must remain in the main waiting area with an adult.  If the patient needs to stay at the hospital during part of their recovery, the visitor guidelines for inpatient rooms apply.  Please refer to the [facility] website for the visitor guidelines for any additional information.   If you received a COVID test during your pre-op visit  it is requested that you wear a mask when out in public, stay away from anyone that may not be feeling well and notify your surgeon if you develop symptoms. If you have been in contact with anyone that has tested positive in the last 10 days please notify you surgeon.      Pre-operative 5 CHG Bathing Instructions   You can play a key role in reducing the risk of infection after surgery. Your skin needs to be as free of germs as possible. You can reduce the number of germs on your skin by washing with CHG (chlorhexidine  gluconate) soap before surgery. CHG is an antiseptic soap that kills germs and continues to kill germs even after washing.   DO NOT use if you have an allergy to chlorhexidine /CHG or antibacterial soaps. If your skin becomes reddened or irritated, stop using the CHG and notify one of our RNs at [phone].   Please shower with the CHG soap starting 4 days before surgery using the following schedule:     Please keep in mind the following:  DO NOT shave, including legs and underarms, starting the day of your first shower.   You may shave your face at any point before/day of surgery.  Place clean sheets on  your bed the day you start using CHG soap. Use a clean washcloth (not used since being washed) for each shower. DO NOT sleep with pets once you start using the CHG.   CHG Shower Instructions:  Wash your face and private area with normal soap. If you choose to wash your hair, wash first with your normal shampoo.  After you use shampoo/soap, rinse your hair and body thoroughly to remove shampoo/soap residue.  Turn the water OFF and apply about 3 tablespoons (45 ml) of CHG soap to a CLEAN washcloth.  Apply CHG soap ONLY FROM YOUR NECK DOWN TO YOUR TOES (washing for 3-5 minutes)  DO NOT use CHG soap on face, private areas, open wounds, or sores.  Pay special attention to the area where your surgery is being performed.  If you are having back surgery, having someone wash your back for you may be helpful. Wait 2 minutes after CHG soap is applied, then you may rinse off the CHG soap.  Pat dry with a  clean towel  Put on clean clothes/pajamas   If you choose to wear lotion, please use ONLY the CHG-compatible lotions that are listed below.  Additional instructions for the day of surgery: DO NOT APPLY any lotions, deodorants, cologne, or perfumes.   Do not bring valuables to the hospital. [facility] is not responsible for any belongings/valuables. Do not wear nail polish, gel polish, artificial nails, or any other type of covering on natural nails (fingers and toes) Do not wear jewelry or makeup Put on clean/comfortable clothes.  Please brush your teeth.  Ask your nurse before applying any prescription medications to the skin.     CHG Compatible Lotions   Aveeno Moisturizing lotion  Cetaphil Moisturizing Cream  Cetaphil Moisturizing Lotion  Clairol Herbal Essence Moisturizing Lotion, Dry Skin  Clairol Herbal Essence Moisturizing Lotion, Extra Dry Skin  Clairol Herbal Essence Moisturizing Lotion, Normal Skin  Curel Age Defying Therapeutic Moisturizing Lotion with Alpha Hydroxy  Curel  Extreme Care Body Lotion  Curel Soothing Hands Moisturizing Hand Lotion  Curel Therapeutic Moisturizing Cream, Fragrance-Free  Curel Therapeutic Moisturizing Lotion, Fragrance-Free  Curel Therapeutic Moisturizing Lotion, Original Formula  Eucerin Daily Replenishing Lotion  Eucerin Dry Skin Therapy Plus Alpha Hydroxy Crme  Eucerin Dry Skin Therapy Plus Alpha Hydroxy Lotion  Eucerin Original Crme  Eucerin Original Lotion  Eucerin Plus Crme Eucerin Plus Lotion  Eucerin TriLipid Replenishing Lotion  Keri Anti-Bacterial Hand Lotion  Keri Deep Conditioning Original Lotion Dry Skin Formula Softly Scented  Keri Deep Conditioning Original Lotion, Fragrance Free Sensitive Skin Formula  Keri Lotion Fast Absorbing Fragrance Free Sensitive Skin Formula  Keri Lotion Fast Absorbing Softly Scented Dry Skin Formula  Keri Original Lotion  Keri Skin Renewal Lotion Keri Silky Smooth Lotion  Keri Silky Smooth Sensitive Skin Lotion  Nivea Body Creamy Conditioning Oil  Nivea Body Extra Enriched Lotion  Nivea Body Original Lotion  Nivea Body Sheer Moisturizing Lotion Nivea Crme  Nivea Skin Firming Lotion  NutraDerm 30 Skin Lotion  NutraDerm Skin Lotion  NutraDerm Therapeutic Skin Cream  NutraDerm Therapeutic Skin Lotion  ProShield Protective Hand Cream  Provon moisturizing lotion  Please read over the following fact sheets that you were given.

## 2023-12-13 ENCOUNTER — Inpatient Hospital Stay (HOSPITAL_COMMUNITY)
Admission: RE | Admit: 2023-12-13 | Discharge: 2023-12-13 | Disposition: A | Discharge status: Home / Self Care | Source: Ambulatory Visit

## 2023-12-13 NOTE — Progress Notes (Signed)
 Patient did not show for PAT appointment today and no return call at this time

## 2023-12-13 NOTE — Progress Notes (Signed)
 Message left for patient awaiting arrival for PAT appointment at 2:00 pm.

## 2023-12-14 ENCOUNTER — Encounter: Payer: Self-pay | Admitting: Cardiology

## 2023-12-14 ENCOUNTER — Ambulatory Visit: Attending: Cardiology | Admitting: Cardiology

## 2023-12-14 VITALS — BP 126/74 | HR 62 | Ht 71.0 in | Wt 236.0 lb

## 2023-12-14 DIAGNOSIS — E785 Hyperlipidemia, unspecified: Secondary | ICD-10-CM | POA: Diagnosis not present

## 2023-12-14 DIAGNOSIS — Z01810 Encounter for preprocedural cardiovascular examination: Secondary | ICD-10-CM

## 2023-12-14 DIAGNOSIS — R9431 Abnormal electrocardiogram [ECG] [EKG]: Secondary | ICD-10-CM

## 2023-12-14 DIAGNOSIS — I10 Essential (primary) hypertension: Secondary | ICD-10-CM

## 2023-12-14 NOTE — Patient Instructions (Addendum)
 Thank you for choosing [facility]!     Medication Instructions:  No medication changes were made during today's visit.  *If you need a refill on your cardiac medications before your next appointment, please call your pharmacy*   Lab Work: Labs will be drawn today............. MAGNESIUM , BMET If you have labs (blood work) drawn today and your tests are completely normal, you will receive your results only by: MyChart Message (if you have MyChart) OR A paper copy in the mail If you have any lab test that is abnormal or we need to change your treatment, we will call you to review the results.   Testing/Procedures: RETURN IN ONE MONTH FOR EKG.  Your next appointment:   1 year(s) A letter will be mailed to you as a reminder to call the office for your next follow up appointment.    Provider:   Lynwood Schilling, MD     Follow-Up: At [facility], you and your health needs are our priority.  As part of our continuing mission to provide you with exceptional heart care, we have created designated Provider Care Teams.  These Care Teams include your primary Cardiologist (physician) and Advanced Practice Providers (APPs -  Physician Assistants and Nurse Practitioners) who all work together to provide you with the care you need, when you need it. We recommend signing up for the patient portal called MyChart.  Sign up information is provided on this After Visit Summary.  MyChart is used to connect with patients for Virtual Visits (Telemedicine).  Patients are able to view lab/test results, encounter notes, upcoming appointments, etc.  Non-urgent messages can be sent to your provider as well.   To learn more about what you can do with MyChart, go to [email or web address].

## 2023-12-14 NOTE — Progress Notes (Signed)
 Cardiology Office Note   Date:  12/14/2023  ID:  Harold [name], DOB 04-Jan-1966, MRN [id number] PCP: Norleen Lynwood ORN, MD  [facility] Providers Cardiologist:  Lynwood Schilling, MD   History of Present Illness Harold [name] is a 58 y.o. male with a past medical history of pericarditis, obesity, type 2 DM, tobacco use, HTN, OSA. Patient followed by Dr. Schilling and presents today for preoperative evaluation   Patient has a remote history of pericarditis (at lease 8+ years ago). He hat  POET in 07/2015 that showed a hypertensive response to exercise but no evidence of ischemia.   Patient was last seen by Dr. Schilling on 08/30/22. At that time, patient was doing well. He had lost weight on Mounjaro . Denied chest pain, shortness of breath, edema. BP was well controlled   Today, patient presents for preoperative evaluation.  Reports that he has been doing very well from a cardiovascular perspective.  Denies chest pain, shortness of breath, dizziness, syncope, near syncope, palpitations.  He notes that his physical activity is limited by back pain.  He is able to go up and down stairs and go grocery shopping without chest pain.  His blood pressure is well-controlled.  He reports that he had a pericarditis in  2011/2012.  When he had pericarditis he had shortness of breath and chest discomfort.  He denies any recurrence in the symptoms.  He has occasional lower extremity swelling if he spends a lot of time on his feet.  This usually resolves after he rests and elevates his feet.  He is on Mounjaro  and has had good weight loss.  Estimates being down about 30 pounds since starting the medication   Studies Reviewed  Cardiac Studies & Procedures   ______________________________________________________________________________________________   STRESS TESTS  EXERCISE TOLERANCE TEST (ETT) 08/12/2015  Interpretation Summary  T wave inversion was noted during recovery in the II, III, aVF, V4, V5 and  V6 leads.  Hypertensive response to exercise, peak blood pressure 225/97, rest blood pressure 154/103  Shortness of breath main symptom, no chest pain. Fair exercise time of 5 minutes.  Abnormal exercise treadmill test, T wave inversions noted inferior laterally late in recovery. Intermediate risk study.  Oneil Parchment, MD            ______________________________________________________________________________________________       Risk Assessment/Calculations           Physical Exam VS:  BP 126/74   Pulse 62   Ht 5' 11 (1.803 m)   Wt 236 lb (107 kg)   SpO2 97%   BMI 32.92 kg/m        Wt Readings from Last 3 Encounters:  12/14/23 236 lb (107 kg)  10/30/23 247 lb (112 kg)  09/18/23 247 lb (112 kg)    GEN: Well nourished, well developed in no acute distress. Sitting comfortably on the exam table  NECK: No JVD  CARDIAC:  RRR, no murmurs, rubs, gallops RESPIRATORY:  Clear to auscultation without rales, wheezing or rhonchi. Normal WOB on room air   ABDOMEN: Soft, non-tender, non-distended EXTREMITIES:  No edema in BLE; No deformity   ASSESSMENT AND PLAN  Preoperative Evaluation  - Patient reports that he has been followed by cardiology for his history of pericarditis.  Has never had any cardiac stents or diagnosis of heart failure. - Denies chest pain or shortness of breath.  His activity is somewhat limited due to back pain but he is able to go up and down stairs  and go shopping without anginal symptoms -Denies palpitations, dizziness, syncope, near syncope - EKG today without ischemic changes - As patient able to complete greater than 4 METS physical activity without anginal symptoms, no indication for further cardiac testing. - RCRI 1 (high risk surgery). No ischemic heart disease, CHF, cerebrovascular disease. Not on insulin . Creatinine <2  - He does have history of OSA   QT prolongation  - EKG today showed prolonged QT- Qtc measures 495 ms.  - Patient is on  celexa - has been on for >10 years. Has not recently started and QT prolonging medications  - Ordered BMP, mag  - Ordered EKG to be completed in 1 month for monitoring. He has intermittently had QT prolongation in the past as well   HTN  - BP well controlled  - Continue amlodipine  10 mg daily, lisinopril  40 mg daily  - Ordered BMP for medication monitoring   HLD  - Lipid panel from 04/2023 showed LDL 62 - Continue crestor  20 mg daily and fenofibrate  145 mg daily   History of pericarditis  - Occurred in 2011 - No recurrent symptoms concerning for pericarditis   Lower extremity swelling  - Patient has been having occasional lower extremity swelling. Worse if he spends time on his feet. Relieved with rest/elevation. No DOE, orthopnea  - Euvolemic on exam today  - Continue lasix  PRN  - Ordered BMP    Dispo: Follow up with Dr. Lavona in 1 year   Signed, Harold FABIENE Louder, PA-C

## 2023-12-14 NOTE — Telephone Encounter (Signed)
 RN  contacted patient , since he did not reviewed his mychart   message.    Patient would need an appointment for surgery clearance, the surgery I schedule for Sept 22, 2025   Patient states he will come -  I need to get up to get dress to be there on time

## 2023-12-15 ENCOUNTER — Encounter (HOSPITAL_COMMUNITY)
Admission: RE | Admit: 2023-12-15 | Discharge: 2023-12-15 | Disposition: A | Discharge status: Home / Self Care | Source: Ambulatory Visit | Attending: Neurosurgery | Admitting: Neurosurgery

## 2023-12-15 ENCOUNTER — Encounter (HOSPITAL_COMMUNITY): Payer: Self-pay

## 2023-12-15 ENCOUNTER — Ambulatory Visit: Payer: Self-pay | Admitting: Cardiology

## 2023-12-15 ENCOUNTER — Other Ambulatory Visit: Payer: Self-pay

## 2023-12-15 VITALS — BP 146/78 | HR 57 | Temp 98.4°F | Resp 18 | Ht 71.0 in | Wt 236.8 lb

## 2023-12-15 DIAGNOSIS — Z882 Allergy status to sulfonamides status: Secondary | ICD-10-CM | POA: Diagnosis not present

## 2023-12-15 DIAGNOSIS — Z01818 Encounter for other preprocedural examination: Secondary | ICD-10-CM

## 2023-12-15 DIAGNOSIS — M960 Pseudarthrosis after fusion or arthrodesis: Secondary | ICD-10-CM | POA: Diagnosis present

## 2023-12-15 DIAGNOSIS — M5440 Lumbago with sciatica, unspecified side: Secondary | ICD-10-CM | POA: Diagnosis present

## 2023-12-15 DIAGNOSIS — Z9889 Other specified postprocedural states: Secondary | ICD-10-CM | POA: Insufficient documentation

## 2023-12-15 DIAGNOSIS — G2581 Restless legs syndrome: Secondary | ICD-10-CM | POA: Diagnosis present

## 2023-12-15 DIAGNOSIS — E1140 Type 2 diabetes mellitus with diabetic neuropathy, unspecified: Secondary | ICD-10-CM | POA: Diagnosis present

## 2023-12-15 DIAGNOSIS — M109 Gout, unspecified: Secondary | ICD-10-CM | POA: Insufficient documentation

## 2023-12-15 DIAGNOSIS — Z91018 Allergy to other foods: Secondary | ICD-10-CM | POA: Diagnosis not present

## 2023-12-15 DIAGNOSIS — M5442 Lumbago with sciatica, left side: Secondary | ICD-10-CM | POA: Diagnosis not present

## 2023-12-15 DIAGNOSIS — Z981 Arthrodesis status: Secondary | ICD-10-CM | POA: Insufficient documentation

## 2023-12-15 DIAGNOSIS — E785 Hyperlipidemia, unspecified: Secondary | ICD-10-CM | POA: Insufficient documentation

## 2023-12-15 DIAGNOSIS — F17210 Nicotine dependence, cigarettes, uncomplicated: Secondary | ICD-10-CM | POA: Diagnosis present

## 2023-12-15 DIAGNOSIS — Z6833 Body mass index (BMI) 33.0-33.9, adult: Secondary | ICD-10-CM | POA: Diagnosis not present

## 2023-12-15 DIAGNOSIS — T84226A Displacement of internal fixation device of vertebrae, initial encounter: Secondary | ICD-10-CM | POA: Diagnosis present

## 2023-12-15 DIAGNOSIS — E1169 Type 2 diabetes mellitus with other specified complication: Secondary | ICD-10-CM | POA: Insufficient documentation

## 2023-12-15 DIAGNOSIS — K219 Gastro-esophageal reflux disease without esophagitis: Secondary | ICD-10-CM | POA: Insufficient documentation

## 2023-12-15 DIAGNOSIS — E781 Pure hyperglyceridemia: Secondary | ICD-10-CM | POA: Diagnosis present

## 2023-12-15 DIAGNOSIS — Z7985 Long-term (current) use of injectable non-insulin antidiabetic drugs: Secondary | ICD-10-CM | POA: Diagnosis not present

## 2023-12-15 DIAGNOSIS — Y792 Prosthetic and other implants, materials and accessory orthopedic devices associated with adverse incidents: Secondary | ICD-10-CM | POA: Diagnosis present

## 2023-12-15 DIAGNOSIS — Z888 Allergy status to other drugs, medicaments and biological substances status: Secondary | ICD-10-CM | POA: Diagnosis not present

## 2023-12-15 DIAGNOSIS — Z79891 Long term (current) use of opiate analgesic: Secondary | ICD-10-CM | POA: Diagnosis not present

## 2023-12-15 DIAGNOSIS — T84296A Other mechanical complication of internal fixation device of vertebrae, initial encounter: Secondary | ICD-10-CM | POA: Diagnosis not present

## 2023-12-15 DIAGNOSIS — Z8249 Family history of ischemic heart disease and other diseases of the circulatory system: Secondary | ICD-10-CM | POA: Diagnosis not present

## 2023-12-15 DIAGNOSIS — Z01812 Encounter for preprocedural laboratory examination: Secondary | ICD-10-CM | POA: Insufficient documentation

## 2023-12-15 DIAGNOSIS — G4733 Obstructive sleep apnea (adult) (pediatric): Secondary | ICD-10-CM | POA: Diagnosis present

## 2023-12-15 DIAGNOSIS — Z79899 Other long term (current) drug therapy: Secondary | ICD-10-CM | POA: Diagnosis not present

## 2023-12-15 DIAGNOSIS — Z833 Family history of diabetes mellitus: Secondary | ICD-10-CM | POA: Diagnosis not present

## 2023-12-15 DIAGNOSIS — E669 Obesity, unspecified: Secondary | ICD-10-CM | POA: Diagnosis present

## 2023-12-15 DIAGNOSIS — I10 Essential (primary) hypertension: Secondary | ICD-10-CM | POA: Diagnosis present

## 2023-12-15 DIAGNOSIS — Z7982 Long term (current) use of aspirin: Secondary | ICD-10-CM | POA: Diagnosis not present

## 2023-12-15 LAB — HEMOGLOBIN A1C
Hgb A1c MFr Bld: 5.4 % (ref 4.8–5.6)
Mean Plasma Glucose: 108.28 mg/dL

## 2023-12-15 LAB — CBC
HCT: 42.9 % (ref 39.0–52.0)
Hemoglobin: 13.9 g/dL (ref 13.0–17.0)
MCH: 31.7 pg (ref 26.0–34.0)
MCHC: 32.4 g/dL (ref 30.0–36.0)
MCV: 97.7 fL (ref 80.0–100.0)
Platelets: 408 K/uL — ABNORMAL HIGH (ref 150–400)
RBC: 4.39 MIL/uL (ref 4.22–5.81)
RDW: 13.2 % (ref 11.5–15.5)
WBC: 9.4 K/uL (ref 4.0–10.5)
nRBC: 0 % (ref 0.0–0.2)

## 2023-12-15 LAB — BASIC METABOLIC PANEL WITH GFR
BUN/Creatinine Ratio: 10 (ref 9–20)
BUN: 10 mg/dL (ref 6–24)
CO2: 23 mmol/L (ref 20–29)
Calcium: 9.6 mg/dL (ref 8.7–10.2)
Chloride: 102 mmol/L (ref 96–106)
Creatinine, Ser: 1.03 mg/dL (ref 0.76–1.27)
Glucose: 83 mg/dL (ref 70–99)
Potassium: 4.2 mmol/L (ref 3.5–5.2)
Sodium: 140 mmol/L (ref 134–144)
eGFR: 85 mL/min/1.73 (ref >59)

## 2023-12-15 LAB — GLUCOSE, CAPILLARY: Glucose-Capillary: 115 mg/dL — ABNORMAL HIGH (ref 70–99)

## 2023-12-15 LAB — SURGICAL PCR SCREEN
MRSA, PCR: NEGATIVE
Staphylococcus aureus: NEGATIVE

## 2023-12-15 LAB — TYPE AND SCREEN
ABO/RH(D): A POS
Antibody Screen: NEGATIVE

## 2023-12-15 LAB — MAGNESIUM: Magnesium: 2.1 mg/dL (ref 1.6–2.3)

## 2023-12-15 NOTE — Progress Notes (Addendum)
 PCP - Dr. Lynwood Rush Cardiologist - Dr. Lynwood Schilling   Chest x-ray -  EKG - 12/14/23 Stress Test - 08/12/2015 ECHO - 03/26/2010 Cardiac Cath - 06/14/2010  Sleep Study - OSA+ 02/01/2021 BiPap- not routinely using  Fasting Blood Sugar -  Checks Blood Sugar _____ times a day  Last dose of GLP1 agonist-  Mounjaro  LD 12/08/23 GLP1 instructions: Hold 1 week  Blood Thinner Instructions: Aspirin  Instructions: Pt stopped Monday 9/15  ERAS Protcol - NPO PRE-SURGERY Ensure or G2-   COVID TEST- n/a  Pt takes doxycyline BID routinely for back acne--states Dr. Onetha aware. Also states no open sores or wounds on body.   Anesthesia review: Yes, cardiac hx  Patient denies shortness of breath, fever, cough and chest pain at PAT appointment

## 2023-12-15 NOTE — Progress Notes (Addendum)
 Anesthesia Chart Review:  Case: [id number] Date/Time: 12/18/23 0715   Procedure: LAMINECTOMY WITH POSTERIOR LATERAL ARTHRODESIS LEVEL 1 (Back) - L4-L5 exploration of fusion removal hardware redo posterolateral arthrodesis non instrumented   Anesthesia type: General   Diagnosis: Low back pain with sciatica, sciatica laterality unspecified, unspecified back pain laterality, unspecified chronicity [M54.40]   Pre-op diagnosis: lumbago with sciatica   [facility]OM [facility] / [facility]   Surgeons: Harold Kuba, MD       DISCUSSION:  Patient is a 58 year old male scheduled for the above procedure.   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  Last cardiology follow-up was on 12/14/2023 with Harold Sauer, PA-C. He was doing well fro ma cardiac standpoint. She wrote, Preoperative Evaluation  - Patient reports that he has been followed by cardiology for his history of pericarditis.  Has never had any cardiac stents or diagnosis of heart failure. - Denies chest pain or shortness of breath.  His activity is somewhat limited due to back pain but he is able to go up and down stairs and go shopping without anginal symptoms -Denies palpitations, dizziness, syncope, near syncope - EKG today without ischemic changes - As patient able to complete greater than 4 METS physical activity without anginal symptoms, no indication for further cardiac testing. - RCRI 1 (high risk surgery). No ischemic heart disease, CHF, cerebrovascular disease.  Not on insulin . Creatinine <2  - He does have history of OSA. Noted prolonged QT on EKG (on Celexa  for > 10 years). BMP and Mg ordered and were normal. Plan for repeat EKG in 1 month. Follow-up 1 year planned.    Last Mounjaro  12/08/2023. Last AS 12/11/2023.   Anesthesia team to evaluate on the day of surgery.   VS: BP (!) 146/78   Pulse (!) 57   Temp 36.9 C   Resp 18   Ht 5' 11 (1.803 m)   Wt 107.4 kg   SpO2 97%   BMI 33.03 kg/m   PROVIDERS: Harold [name] ORN, MD is PCP Harold Lynwood, MD is cardiologist   LABS: Labs from 12/14/2023 and 12/15/2023 noted. A1c 5.4%. (all labs ordered are listed, but only abnormal results are displayed)  Labs Reviewed  GLUCOSE, CAPILLARY - Abnormal; Notable for the following components:      Result Value   Glucose-Capillary 115 (*)    All other components within normal limits  CBC - Abnormal; Notable for the following components:   Platelets 408 (*)    All other components within normal limits  SURGICAL PCR SCREEN  HEMOGLOBIN A1C  TYPE AND SCREEN  Last metabolic panel Lab Results  Component Value Date   GLUCOSE 83 12/14/2023   NA 140 12/14/2023   K 4.2 12/14/2023   CL 102 12/14/2023   CO2 23 12/14/2023   BUN 10 12/14/2023   CREATININE 1.03 12/14/2023   EGFR 85 12/14/2023   CALCIUM  9.6 12/14/2023   PROT 6.7 05/05/2023   ALBUMIN  4.4 05/05/2023   BILITOT 0.5 05/05/2023   ALKPHOS 63 05/05/2023   AST 18 05/05/2023   ALT 15 05/05/2023   ANIONGAP 7 06/15/2023      IMAGES: CT L-spine 05/19/2023: IMPRESSION: 1. Stable  appearing fusion hardware with some areas of solid interbody fusion at L3-4 and L4-5. 2. Stable mild lucency around the L4.  No progressive changes. 3. Persistent protrusion of the left interbody fusion device into the left neural foramen at L4-5. There is also some adjacent bone grafting material. Stable small bone fragments in the right neural foramen at L4-5. 4. No spinal or foraminal stenosis at L3-4 or L5-S1.     EKG: Normal sinus rhythm Right axis deviation Prolonged QT (QT/QTcB 488/495 ms) When compared with ECG of 27-Sep-2020 08:13, No significant change Confirmed by Harold [name] [phone]) on 12/14/2023 4:27:57 PM   CV: Exercise tolerance test 08/12/2015: T wave inversion was noted during recovery in the II, III, aVF, V4, V5 and V6 leads. Hypertensive response to exercise, peak blood pressure 225/97, rest blood pressure 154/103 Shortness of breath main symptom, no chest pain. Fair exercise time of 5 minutes. Abnormal exercise treadmill test, T wave inversions noted inferior laterally late in recovery. Intermediate risk study.   Dr. Lavona commented on result stating that the EKG changes were nonspecific and the main issue was his hypertensive response.    TTE 03/26/2010 (during hospitalization for pericarditis): Study Conclusions  - Left ventricle: The cavity size was normal. Wall thickness was  increased in a pattern of mild LVH. Systolic function was normal.  The estimated ejection fraction was in the range of 55% to 60%.  Wall motion was normal; there were no regional wall motion  abnormalities. Left ventricular diastolic function parameters were  indeterminant.  - Aortic valve: There was no stenosis.  - Mitral valve: Trivial regurgitation.  - Right ventricle: The cavity size was normal. Systolic function was  normal.  - Tricuspid valve: Peak RV-RA gradient: 22mm Hg (S).  - Pulmonary arteries: PA peak pressure: 27mm Hg (S).  - Inferior vena cava: The vessel was normal in size; the  respirophasic diameter changes were in the normal range (= 50%);  findings are consistent with normal central venous pressure.  - Pericardium, extracardiac: Mild to moderate pericardial effusion.  There is mild right atrial indentation. No significant RV  diastolic collapse. The IVC is normal. < 25% respirophasic  variation of the mitral inflow E velocity. No evidence for  tamponade.       Past Medical History:  Diagnosis Date   Acute idiopathic pericarditis 04/08/2010   Qualifier: Diagnosis of  By: Lelon RIGGERS, [name]     Acute sinusitis 06/04/2014   Anxiety    CHF (congestive heart failure) (HCC) 06/29/2010   was pericardititis not CHF   Chronic back pain    Constipation    DDD (degenerative disc disease), lumbar 09/14/2012   Depression    pt denies   Erectile dysfunction 09/14/2012   GERD (gastroesophageal reflux disease)    ocassional   Gout, unspecified 05/01/2007   Qualifier: Diagnosis of  By: Krystal MD, Reyes A    Headache    migraines in the past   History of pancreatitis    a. admx 04-2009.SABRASABRA? 2-2 triglycerides   Hypertension    Hypertriglyceridemia    a. followed by [facility]   Left sided sciatica 09/14/2012   Neuropathy    Obesity    OSA (obstructive sleep apnea)    CPAP- not current   Restless legs    Type 2 diabetes mellitus with diabetic neuropathy, unspecified (HCC) 09/20/2019    Past Surgical History:  Procedure Laterality Date   ANAL INTRAEPITHELIAL NEOPLASIA EXCISION Left 02/15/2023   Procedure: EXCISION SEBACEOUS  CYST LEFT AXILLA;  Surgeon: Curvin Deward MOULD, MD;  Location: [facility];  Service: General;  Laterality: Left;   ANTERIOR CERVICAL DECOMP/DISCECTOMY FUSION N/A 09/14/2020   Procedure: Anterior Cervical Decompression Fusion - Cervical three-Cervical four;  Surgeon: Harold Kuba, MD;  Location: [facility];  Service: Neurosurgery;  Laterality: N/A;   BACK SURGERY  2021   lumbar fusion   COLONOSCOPY     ELBOW SURGERY Left    KNEE SURGERY Right    x3 scopes   KNEE SURGERY Right    open menisicus repair   LUMBAR LAMINECTOMY/DECOMPRESSION MICRODISCECTOMY N/A 08/26/2015   Procedure: Lumbar Two-Sacral One  Laminectomy for decompression;  Surgeon: Morene Hicks Ditty, MD;  Location: [facility];  Service: Neurosurgery;  Laterality: N/A;   pericardectomy  07/15/2010   Dr. Army   pleurx catheter placement   07/15/2010   Dr. Army   SHOULDER SURGERY     right and left shoulders- Rotator cuff repair   SHOULDER SURGERY Right 2023   TONSILLECTOMY AND ADENOIDECTOMY     one tonsil    MEDICATIONS:  AMBULATORY NON FORMULARY MEDICATION   amLODipine  (NORVASC ) 10 MG tablet   Aromatic Inhalants (VICKS VAPOINHALER IN)   aspirin  EC 81 MG tablet   aspirin -sod bicarb-citric acid (ALKA-SELTZER) 325 MG TBEF tablet   Blood Glucose Monitoring Suppl (ONE TOUCH ULTRA 2) w/Device KIT   Cholecalciferol  (VITAMIN D3 PO)   citalopram  (CELEXA ) 40 MG tablet   Continuous Blood Gluc Receiver (DEXCOM G7 RECEIVER) DEVI   Continuous Blood Gluc Sensor (DEXCOM G7 SENSOR) MISC   diclofenac  Sodium (VOLTAREN ) 1 % GEL   doxycycline  (VIBRA -TABS) 100 MG tablet   fenofibrate  (TRICOR ) 145 MG tablet   fluticasone  (FLONASE ) 50 MCG/ACT nasal spray   furosemide  (LASIX ) 40 MG tablet   gabapentin  (NEURONTIN ) 300 MG capsule   Garlic (GARLIQUE PO)   glucose blood test strip   hydrochlorothiazide  (MICROZIDE ) 12.5 MG capsule   hydroquinone  4 % cream   ibuprofen  (ADVIL ) 800 MG tablet   iron  polysaccharides (NU-IRON ) 150 MG capsule   Lancets MISC   lisinopril  (ZESTRIL ) 40 MG tablet   loratadine  (CLARITIN ) 10 MG tablet   Magnesium  200 MG TABS   Misc Natural Products (BEET ROOT PO)   omeprazole  (PRILOSEC) 40 MG capsule   PERCOCET 10-325 MG tablet   Phenylephrine -Acetaminophen  (TYLENOL  SINUS CONGESTION/PAIN PO)   Polyethyl Glycol-Propyl Glycol (LUBRICANT EYE DROPS) 0.4-0.3 % SOLN   Potassium Chloride  ER 20 MEQ TBCR   rosuvastatin  (CRESTOR ) 20 MG tablet   tadalafil  (CIALIS ) 20 MG tablet   tamsulosin  (FLOMAX ) 0.4 MG CAPS capsule   tirzepatide  (MOUNJARO ) 12.5 MG/0.5ML Pen   tiZANidine  (ZANAFLEX ) 4 MG tablet   tolterodine  (DETROL  LA) 4 MG 24 hr capsule   varenicline  (CHANTIX  CONTINUING MONTH PAK) 1 MG tablet   No current facility-administered medications for this encounter.   On course of doxycycline  for acne, prescribed  12/06/2023.   Isaiah Ruder, PA-C Surgical Short Stay/Anesthesiology [facility] Phone [phone] [facility] Phone [phone] 12/15/2023 6:27 PM

## 2023-12-15 NOTE — Anesthesia Preprocedure Evaluation (Addendum)
 Anesthesia Evaluation  Patient identified by MRN, date of birth, ID band Patient awake    Reviewed: Allergy & Precautions, NPO status , Patient's Chart, lab work & pertinent test results  Airway Mallampati: II  TM Distance: >3 FB Neck ROM: Full    Dental no notable dental hx. (+) Edentulous Upper, Edentulous Lower   Pulmonary sleep apnea , Current Smoker   Pulmonary exam normal        Cardiovascular hypertension, Pt. on medications +CHF   Rhythm:Regular Rate:Normal     Neuro/Psych  Headaches  Anxiety Depression       GI/Hepatic Neg liver ROS,GERD  Medicated,,  Endo/Other  diabetes    Renal/GU   negative genitourinary   Musculoskeletal  (+) Arthritis , Osteoarthritis,    Abdominal Normal abdominal exam  (+)   Peds  Hematology Lab Results      Component                Value               Date                      WBC                      9.4                 12/15/2023                HGB                      13.9                12/15/2023                HCT                      42.9                12/15/2023                MCV                      97.7                12/15/2023                PLT                      408 (H)             12/15/2023              Anesthesia Other Findings   Reproductive/Obstetrics                              Anesthesia Physical Anesthesia Plan  ASA: 2  Anesthesia Plan: General   Post-op Pain Management: Tylenol  PO (pre-op)* and Celebrex  PO (pre-op)*   Induction: Intravenous  PONV Risk Score and Plan: 1 and Ondansetron , Dexamethasone , Midazolam  and Treatment may vary due to age or medical condition  Airway Management Planned: Mask and Oral ETT  Additional Equipment: None  Intra-op Plan:   Post-operative Plan: Extubation in OR  Informed Consent: I have reviewed the patients History and Physical, chart, labs and discussed the procedure including  the risks, benefits and alternatives for  the proposed anesthesia with the patient or authorized representative who has indicated his/her understanding and acceptance.     Dental advisory given  Plan Discussed with: CRNA  Anesthesia Plan Comments: (PAT note written 12/15/2023 by Allison Zelenak, PA-C.  )         Anesthesia Quick Evaluation

## 2023-12-15 NOTE — Progress Notes (Signed)
 Surgical Instructions   Your procedure is scheduled on Monday, September 22nd, 2025. Report to [facility] Main Entrance A at 7:30 A.M., then check in with the Admitting office. Any questions or running late day of surgery: call [phone]  Questions prior to your surgery date: call [phone], Monday-Friday, 8am-4pm. If you experience any cold or flu symptoms such as cough, fever, chills, shortness of breath, etc. between now and your scheduled surgery, please notify us  at the above number.     Remember:  Do not eat or drink after midnight the night before your surgery    Take these medicines the morning of surgery with A SIP OF WATER: Amlodipine  (Norvasc ) Citalopram  (Celexa ) Doxycycline  (Vibra -tabs) Gabapentin  (Neurontin ) Omeprazole  (Prilosec) Rosuvastatin  (Crestor ) Tamsulosin  (Flomax ) Loratadine  (Claritin )    May take these medicines IF NEEDED: Fluticasone  (Flonase ) Percocet Tizanidine  (Zanaflex ) Tolterodine  (Detrol )   Follow your surgeon's instructions on when to stop your Aspirin , if no instructions received, please contact your surgeon's office.     One week prior to surgery, STOP taking any Aspirin  (unless otherwise instructed by your surgeon) Aleve, Naproxen, Ibuprofen , Motrin , Advil , Goody's, BC's, all herbal medications, fish oil, and non-prescription vitamins.  This includes Diclofenac  Sodium (Voltaren ) and Aspirin -sod bicarb-citric acid (Alka-Sletzer).    WHAT DO I DO ABOUT MY DIABETES MEDICATION?   Tirzepatide  (Mounjaro ) should be stopped 7 days prior to surgery.  Your last dose of Tirzepatide  (Mounjaro ) should be on or before September 14 th.    HOW TO MANAGE YOUR DIABETES BEFORE AND AFTER SURGERY  Why is it important to control my blood sugar before and after surgery? Improving blood sugar levels before and after surgery helps healing and can limit problems. A way of improving blood sugar control is eating a healthy diet by:  Eating less  sugar and carbohydrates  Increasing activity/exercise  Talking with your doctor about reaching your blood sugar goals High blood sugars (greater than 180 mg/dL) can raise your risk of infections and slow your recovery, so you will need to focus on controlling your diabetes during the weeks before surgery. Make sure that the doctor who takes care of your diabetes knows about your planned surgery including the date and location.  How do I manage my blood sugar before surgery? Check your blood sugar at least 4 times a day, starting 2 days before surgery, to make sure that the level is not too high or low.  Check your blood sugar the morning of your surgery when you wake up and every 2 hours until you get to the Short Stay unit.  If your blood sugar is less than 70 mg/dL, you will need to treat for low blood sugar: Do not take insulin . Treat a low blood sugar (less than 70 mg/dL) with  cup of clear juice (cranberry or apple), 4 glucose tablets, OR glucose gel. Recheck blood sugar in 15 minutes after treatment (to make sure it is greater than 70 mg/dL). If your blood sugar is not greater than 70 mg/dL on recheck, call [phone] for further instructions. Report your blood sugar to the short stay nurse when you get to Short Stay.  If you are admitted to the hospital after surgery: Your blood sugar will be checked by the staff and you will probably be given insulin  after surgery (instead of oral diabetes medicines) to make sure you have good blood sugar levels. The goal for blood sugar control after surgery is 80-180 mg/dL.  Do NOT Smoke (Tobacco/Vaping) for 24 hours prior to your procedure.  If you use a CPAP at night, you may bring your mask/headgear for your overnight stay.   You will be asked to remove any contacts, glasses, piercing's, hearing aid's, dentures/partials prior to surgery. Please bring cases for these items if needed.    Patients discharged the day of  surgery will not be allowed to drive home, and someone needs to stay with them for 24 hours.  SURGICAL WAITING ROOM VISITATION Patients may have no more than 2 support people in the waiting area - these visitors may rotate.   Pre-op nurse will coordinate an appropriate time for 1 ADULT support person, who may not rotate, to accompany patient in pre-op.  Children under the age of 68 must have an adult with them who is not the patient and must remain in the main waiting area with an adult.  If the patient needs to stay at the hospital during part of their recovery, the visitor guidelines for inpatient rooms apply.  Please refer to the [facility] website for the visitor guidelines for any additional information.   If you received a COVID test during your pre-op visit  it is requested that you wear a mask when out in public, stay away from anyone that may not be feeling well and notify your surgeon if you develop symptoms. If you have been in contact with anyone that has tested positive in the last 10 days please notify you surgeon.      Pre-operative 5 CHG Bathing Instructions   You can play a key role in reducing the risk of infection after surgery. Your skin needs to be as free of germs as possible. You can reduce the number of germs on your skin by washing with CHG (chlorhexidine  gluconate) soap before surgery. CHG is an antiseptic soap that kills germs and continues to kill germs even after washing.   DO NOT use if you have an allergy to chlorhexidine /CHG or antibacterial soaps. If your skin becomes reddened or irritated, stop using the CHG and notify one of our RNs at [phone].   Please shower with the CHG soap starting 4 days before surgery using the following schedule:     Please keep in mind the following:  DO NOT shave, including legs and underarms, starting the day of your first shower.   You may shave your face at any point before/day of surgery.  Place clean sheets on your  bed the day you start using CHG soap. Use a clean washcloth (not used since being washed) for each shower. DO NOT sleep with pets once you start using the CHG.   CHG Shower Instructions:  Wash your face and private area with normal soap. If you choose to wash your hair, wash first with your normal shampoo.  After you use shampoo/soap, rinse your hair and body thoroughly to remove shampoo/soap residue.  Turn the water OFF and apply about 3 tablespoons (45 ml) of CHG soap to a CLEAN washcloth.  Apply CHG soap ONLY FROM YOUR NECK DOWN TO YOUR TOES (washing for 3-5 minutes)  DO NOT use CHG soap on face, private areas, open wounds, or sores.  Pay special attention to the area where your surgery is being performed.  If you are having back surgery, having someone wash your back for you may be helpful. Wait 2 minutes after CHG soap is applied, then you may rinse off the CHG soap.  Pat dry with a  clean towel  Put on clean clothes/pajamas   If you choose to wear lotion, please use ONLY the CHG-compatible lotions that are listed below.  Additional instructions for the day of surgery: DO NOT APPLY any lotions, deodorants, cologne, or perfumes.   Do not bring valuables to the hospital. [facility] is not responsible for any belongings/valuables. Do not wear nail polish, gel polish, artificial nails, or any other type of covering on natural nails (fingers and toes) Do not wear jewelry or makeup Put on clean/comfortable clothes.  Please brush your teeth.  Ask your nurse before applying any prescription medications to the skin.     CHG Compatible Lotions   Aveeno Moisturizing lotion  Cetaphil Moisturizing Cream  Cetaphil Moisturizing Lotion  Clairol Herbal Essence Moisturizing Lotion, Dry Skin  Clairol Herbal Essence Moisturizing Lotion, Extra Dry Skin  Clairol Herbal Essence Moisturizing Lotion, Normal Skin  Curel Age Defying Therapeutic Moisturizing Lotion with Alpha Hydroxy  Curel Extreme  Care Body Lotion  Curel Soothing Hands Moisturizing Hand Lotion  Curel Therapeutic Moisturizing Cream, Fragrance-Free  Curel Therapeutic Moisturizing Lotion, Fragrance-Free  Curel Therapeutic Moisturizing Lotion, Original Formula  Eucerin Daily Replenishing Lotion  Eucerin Dry Skin Therapy Plus Alpha Hydroxy Crme  Eucerin Dry Skin Therapy Plus Alpha Hydroxy Lotion  Eucerin Original Crme  Eucerin Original Lotion  Eucerin Plus Crme Eucerin Plus Lotion  Eucerin TriLipid Replenishing Lotion  Keri Anti-Bacterial Hand Lotion  Keri Deep Conditioning Original Lotion Dry Skin Formula Softly Scented  Keri Deep Conditioning Original Lotion, Fragrance Free Sensitive Skin Formula  Keri Lotion Fast Absorbing Fragrance Free Sensitive Skin Formula  Keri Lotion Fast Absorbing Softly Scented Dry Skin Formula  Keri Original Lotion  Keri Skin Renewal Lotion Keri Silky Smooth Lotion  Keri Silky Smooth Sensitive Skin Lotion  Nivea Body Creamy Conditioning Oil  Nivea Body Extra Enriched Lotion  Nivea Body Original Lotion  Nivea Body Sheer Moisturizing Lotion Nivea Crme  Nivea Skin Firming Lotion  NutraDerm 30 Skin Lotion  NutraDerm Skin Lotion  NutraDerm Therapeutic Skin Cream  NutraDerm Therapeutic Skin Lotion  ProShield Protective Hand Cream  Provon moisturizing lotion  Please read over the following fact sheets that you were given.

## 2023-12-15 NOTE — Progress Notes (Signed)
 Pt reminded of surgery time change for 12/18/23 0730-1030, arrival 0730, and to follow all other previous instructions given.

## 2023-12-18 ENCOUNTER — Inpatient Hospital Stay (HOSPITAL_COMMUNITY)

## 2023-12-18 ENCOUNTER — Encounter (HOSPITAL_COMMUNITY): Payer: Self-pay | Admitting: Neurosurgery

## 2023-12-18 ENCOUNTER — Inpatient Hospital Stay (HOSPITAL_COMMUNITY): Payer: Self-pay | Admitting: Physician Assistant

## 2023-12-18 ENCOUNTER — Inpatient Hospital Stay (HOSPITAL_COMMUNITY)
Admission: RE | Admit: 2023-12-18 | Discharge: 2023-12-19 | DRG: 451 | Disposition: A | Discharge status: Home / Self Care | Attending: Neurosurgery | Admitting: Neurosurgery

## 2023-12-18 ENCOUNTER — Encounter (HOSPITAL_COMMUNITY)
Admission: RE | Disposition: A | Discharge status: Home / Self Care | Payer: Self-pay | Source: Home / Self Care | Attending: Neurosurgery

## 2023-12-18 ENCOUNTER — Other Ambulatory Visit: Payer: Self-pay

## 2023-12-18 DIAGNOSIS — G4733 Obstructive sleep apnea (adult) (pediatric): Secondary | ICD-10-CM | POA: Diagnosis present

## 2023-12-18 DIAGNOSIS — Z8249 Family history of ischemic heart disease and other diseases of the circulatory system: Secondary | ICD-10-CM

## 2023-12-18 DIAGNOSIS — I10 Essential (primary) hypertension: Secondary | ICD-10-CM | POA: Diagnosis present

## 2023-12-18 DIAGNOSIS — Y792 Prosthetic and other implants, materials and accessory orthopedic devices associated with adverse incidents: Secondary | ICD-10-CM | POA: Diagnosis present

## 2023-12-18 DIAGNOSIS — Z79891 Long term (current) use of opiate analgesic: Secondary | ICD-10-CM

## 2023-12-18 DIAGNOSIS — Z79899 Other long term (current) drug therapy: Secondary | ICD-10-CM | POA: Diagnosis not present

## 2023-12-18 DIAGNOSIS — Z01818 Encounter for other preprocedural examination: Secondary | ICD-10-CM

## 2023-12-18 DIAGNOSIS — F17210 Nicotine dependence, cigarettes, uncomplicated: Secondary | ICD-10-CM | POA: Diagnosis present

## 2023-12-18 DIAGNOSIS — Z882 Allergy status to sulfonamides status: Secondary | ICD-10-CM | POA: Diagnosis not present

## 2023-12-18 DIAGNOSIS — T84296A Other mechanical complication of internal fixation device of vertebrae, initial encounter: Secondary | ICD-10-CM

## 2023-12-18 DIAGNOSIS — Z888 Allergy status to other drugs, medicaments and biological substances status: Secondary | ICD-10-CM | POA: Diagnosis not present

## 2023-12-18 DIAGNOSIS — Z6833 Body mass index (BMI) 33.0-33.9, adult: Secondary | ICD-10-CM | POA: Diagnosis not present

## 2023-12-18 DIAGNOSIS — Z7982 Long term (current) use of aspirin: Secondary | ICD-10-CM

## 2023-12-18 DIAGNOSIS — T84226A Displacement of internal fixation device of vertebrae, initial encounter: Secondary | ICD-10-CM | POA: Diagnosis present

## 2023-12-18 DIAGNOSIS — G2581 Restless legs syndrome: Secondary | ICD-10-CM | POA: Diagnosis present

## 2023-12-18 DIAGNOSIS — E781 Pure hyperglyceridemia: Secondary | ICD-10-CM | POA: Diagnosis present

## 2023-12-18 DIAGNOSIS — E1140 Type 2 diabetes mellitus with diabetic neuropathy, unspecified: Secondary | ICD-10-CM | POA: Diagnosis present

## 2023-12-18 DIAGNOSIS — M960 Pseudarthrosis after fusion or arthrodesis: Secondary | ICD-10-CM | POA: Diagnosis present

## 2023-12-18 DIAGNOSIS — Z833 Family history of diabetes mellitus: Secondary | ICD-10-CM | POA: Diagnosis not present

## 2023-12-18 DIAGNOSIS — M5440 Lumbago with sciatica, unspecified side: Secondary | ICD-10-CM | POA: Diagnosis present

## 2023-12-18 DIAGNOSIS — Z7985 Long-term (current) use of injectable non-insulin antidiabetic drugs: Secondary | ICD-10-CM | POA: Diagnosis not present

## 2023-12-18 DIAGNOSIS — E669 Obesity, unspecified: Secondary | ICD-10-CM | POA: Diagnosis present

## 2023-12-18 DIAGNOSIS — Z91018 Allergy to other foods: Secondary | ICD-10-CM | POA: Diagnosis not present

## 2023-12-18 DIAGNOSIS — E1169 Type 2 diabetes mellitus with other specified complication: Secondary | ICD-10-CM

## 2023-12-18 DIAGNOSIS — S32009K Unspecified fracture of unspecified lumbar vertebra, subsequent encounter for fracture with nonunion: Principal | ICD-10-CM | POA: Diagnosis present

## 2023-12-18 HISTORY — PX: LAMINECTOMY WITH POSTERIOR LATERAL ARTHRODESIS LEVEL 1: SHX6335

## 2023-12-18 LAB — GLUCOSE, CAPILLARY
Glucose-Capillary: 120 mg/dL — ABNORMAL HIGH (ref 70–99)
Glucose-Capillary: 142 mg/dL — ABNORMAL HIGH (ref 70–99)
Glucose-Capillary: 151 mg/dL — ABNORMAL HIGH (ref 70–99)
Glucose-Capillary: 204 mg/dL — ABNORMAL HIGH (ref 70–99)

## 2023-12-18 SURGERY — LAMINECTOMY WITH POSTERIOR LATERAL ARTHRODESIS LEVEL 1
Anesthesia: General | Site: Back

## 2023-12-18 MED ORDER — VICKS VAPOINHALER IN INHA: Freq: Every day | RESPIRATORY_TRACT | Status: DC | PRN

## 2023-12-18 MED ORDER — PHENOL 1.4 % MT LIQD: 1.0000 | OROMUCOSAL | Status: DC | PRN

## 2023-12-18 MED ORDER — DEXAMETHASONE SODIUM PHOSPHATE 10 MG/ML IJ SOLN
INTRAMUSCULAR | Status: DC | PRN
  Administered 2023-12-18: 10 mg via INTRAVENOUS

## 2023-12-18 MED ORDER — OXYCODONE HCL 5 MG PO TABS: 5.0000 mg | ORAL_TABLET | Freq: Once | ORAL | Status: DC | PRN

## 2023-12-18 MED ORDER — MENTHOL 3 MG MT LOZG: 1.0000 | LOZENGE | OROMUCOSAL | Status: DC | PRN

## 2023-12-18 MED ORDER — 0.9 % SODIUM CHLORIDE (POUR BTL) OPTIME
TOPICAL | Status: DC | PRN
  Administered 2023-12-18 (×2): 1000 mL

## 2023-12-18 MED ORDER — DEXAMETHASONE SODIUM PHOSPHATE 4 MG/ML IJ SOLN
INTRAMUSCULAR | Status: AC
  Filled 2023-12-18: qty 1

## 2023-12-18 MED ORDER — TIZANIDINE HCL 4 MG PO TABS
4.0000 mg | ORAL_TABLET | Freq: Three times a day (TID) | ORAL | Status: DC | PRN
  Administered 2023-12-18 (×2): 4 mg via ORAL
  Filled 2023-12-18 (×2): qty 1

## 2023-12-18 MED ORDER — CHLORHEXIDINE GLUCONATE CLOTH 2 % EX PADS: 6.0000 | MEDICATED_PAD | Freq: Once | CUTANEOUS | Status: DC

## 2023-12-18 MED ORDER — PROPOFOL 500 MG/50ML IV EMUL
INTRAVENOUS | Status: DC | PRN
  Administered 2023-12-18: 120 ug/kg/min via INTRAVENOUS

## 2023-12-18 MED ORDER — CITALOPRAM HYDROBROMIDE 20 MG PO TABS
40.0000 mg | ORAL_TABLET | Freq: Every day | ORAL | Status: DC
  Administered 2023-12-19: 40 mg via ORAL
  Filled 2023-12-18: qty 2

## 2023-12-18 MED ORDER — POLYVINYL ALCOHOL 1.4 % OP SOLN: 1.0000 [drp] | OPHTHALMIC | Status: DC | PRN

## 2023-12-18 MED ORDER — DEXAMETHASONE SODIUM PHOSPHATE 4 MG/ML IJ SOLN
4.0000 mg | Freq: Once | INTRAMUSCULAR | Status: AC
  Administered 2023-12-18: 4 mg via INTRAVENOUS

## 2023-12-18 MED ORDER — FUROSEMIDE 40 MG PO TABS
40.0000 mg | ORAL_TABLET | Freq: Every morning | ORAL | Status: DC
  Administered 2023-12-19: 40 mg via ORAL
  Filled 2023-12-18: qty 1

## 2023-12-18 MED ORDER — SUGAMMADEX SODIUM 200 MG/2ML IV SOLN
INTRAVENOUS | Status: DC | PRN
  Administered 2023-12-18: 400 mg via INTRAVENOUS

## 2023-12-18 MED ORDER — INSULIN ASPART 100 UNIT/ML IJ SOLN: 0.0000 [IU] | Freq: Three times a day (TID) | INTRAMUSCULAR | Status: DC

## 2023-12-18 MED ORDER — ROSUVASTATIN CALCIUM 20 MG PO TABS
20.0000 mg | ORAL_TABLET | Freq: Every day | ORAL | Status: DC
  Administered 2023-12-19: 20 mg via ORAL
  Filled 2023-12-18: qty 1

## 2023-12-18 MED ORDER — CHLORHEXIDINE GLUCONATE 0.12 % MT SOLN
OROMUCOSAL | Status: AC
  Administered 2023-12-18: 15 mL via OROMUCOSAL
  Filled 2023-12-18: qty 15

## 2023-12-18 MED ORDER — POTASSIUM CHLORIDE ER 20 MEQ PO TBCR
1.0000 | EXTENDED_RELEASE_TABLET | Freq: Every day | ORAL | Status: DC
Start: 2023-12-18 — End: 2023-12-18

## 2023-12-18 MED ORDER — ONDANSETRON HCL 4 MG/2ML IJ SOLN
INTRAMUSCULAR | Status: DC | PRN
  Administered 2023-12-18: 4 mg via INTRAVENOUS

## 2023-12-18 MED ORDER — PROPOFOL 10 MG/ML IV BOLUS
INTRAVENOUS | Status: AC
Start: 2023-12-18 — End: 2023-12-18
  Filled 2023-12-18: qty 20

## 2023-12-18 MED ORDER — CEFAZOLIN SODIUM-DEXTROSE 2-4 GM/100ML-% IV SOLN
2.0000 g | INTRAVENOUS | Status: AC
  Administered 2023-12-18: 2 g via INTRAVENOUS

## 2023-12-18 MED ORDER — POTASSIUM CHLORIDE CRYS ER 20 MEQ PO TBCR
20.0000 meq | EXTENDED_RELEASE_TABLET | Freq: Every day | ORAL | Status: DC
  Administered 2023-12-18 – 2023-12-19 (×2): 20 meq via ORAL
  Filled 2023-12-18 (×2): qty 1

## 2023-12-18 MED ORDER — LIDOCAINE 2% (20 MG/ML) 5 ML SYRINGE
INTRAMUSCULAR | Status: DC | PRN
  Administered 2023-12-18: 100 mg via INTRAVENOUS

## 2023-12-18 MED ORDER — BUPIVACAINE LIPOSOME 1.3 % IJ SUSP
INTRAMUSCULAR | Status: AC
  Filled 2023-12-18: qty 20

## 2023-12-18 MED ORDER — SUCCINYLCHOLINE CHLORIDE 200 MG/10ML IV SOSY
PREFILLED_SYRINGE | INTRAVENOUS | Status: AC
  Filled 2023-12-18: qty 10

## 2023-12-18 MED ORDER — FLUTICASONE PROPIONATE 50 MCG/ACT NA SUSP: 2.0000 | Freq: Every day | NASAL | Status: DC | PRN

## 2023-12-18 MED ORDER — GABAPENTIN 300 MG PO CAPS
900.0000 mg | ORAL_CAPSULE | Freq: Three times a day (TID) | ORAL | Status: DC
  Administered 2023-12-18 – 2023-12-19 (×3): 900 mg via ORAL
  Filled 2023-12-18 (×3): qty 3

## 2023-12-18 MED ORDER — ONDANSETRON HCL 4 MG PO TABS: 4.0000 mg | ORAL_TABLET | Freq: Four times a day (QID) | ORAL | Status: DC | PRN

## 2023-12-18 MED ORDER — BUPIVACAINE LIPOSOME 1.3 % IJ SUSP
INTRAMUSCULAR | Status: DC | PRN
  Administered 2023-12-18: 20 mL

## 2023-12-18 MED ORDER — INSULIN ASPART 100 UNIT/ML IJ SOLN
0.0000 [IU] | Freq: Every day | INTRAMUSCULAR | Status: DC
  Administered 2023-12-18: 2 [IU] via SUBCUTANEOUS

## 2023-12-18 MED ORDER — KETAMINE HCL 50 MG/5ML IJ SOSY
PREFILLED_SYRINGE | INTRAMUSCULAR | Status: AC
Start: 2023-12-18 — End: 2023-12-18
  Filled 2023-12-18: qty 5

## 2023-12-18 MED ORDER — PANTOPRAZOLE SODIUM 40 MG PO TBEC
40.0000 mg | DELAYED_RELEASE_TABLET | Freq: Every day | ORAL | Status: DC
  Administered 2023-12-19: 40 mg via ORAL
  Filled 2023-12-18: qty 1

## 2023-12-18 MED ORDER — POLYSACCHARIDE IRON COMPLEX 150 MG PO CAPS
150.0000 mg | ORAL_CAPSULE | Freq: Every day | ORAL | Status: DC
  Administered 2023-12-18 – 2023-12-19 (×2): 150 mg via ORAL
  Filled 2023-12-18 (×2): qty 1

## 2023-12-18 MED ORDER — LIDOCAINE-EPINEPHRINE 1 %-1:100000 IJ SOLN
INTRAMUSCULAR | Status: DC | PRN
  Administered 2023-12-18: 9 mL

## 2023-12-18 MED ORDER — ORAL CARE MOUTH RINSE: 15.0000 mL | Freq: Once | OROMUCOSAL | Status: AC

## 2023-12-18 MED ORDER — EPHEDRINE SULFATE-NACL 50-0.9 MG/10ML-% IV SOSY
PREFILLED_SYRINGE | INTRAVENOUS | Status: DC | PRN
  Administered 2023-12-18 (×4): 5 mg via INTRAVENOUS

## 2023-12-18 MED ORDER — LIDOCAINE-EPINEPHRINE 1 %-1:100000 IJ SOLN
INTRAMUSCULAR | Status: AC
  Filled 2023-12-18: qty 1

## 2023-12-18 MED ORDER — ONDANSETRON HCL 4 MG/2ML IJ SOLN
INTRAMUSCULAR | Status: AC
  Filled 2023-12-18: qty 2

## 2023-12-18 MED ORDER — OXYCODONE HCL 5 MG PO TABS
5.0000 mg | ORAL_TABLET | Freq: Four times a day (QID) | ORAL | Status: DC
  Administered 2023-12-18: 5 mg via ORAL
  Filled 2023-12-18: qty 1

## 2023-12-18 MED ORDER — POLYETHYL GLYCOL-PROPYL GLYCOL 0.4-0.3 % OP SOLN: 1.0000 [drp] | Freq: Three times a day (TID) | OPHTHALMIC | Status: DC | PRN

## 2023-12-18 MED ORDER — BUPIVACAINE HCL (PF) 0.25 % IJ SOLN
INTRAMUSCULAR | Status: AC
  Filled 2023-12-18: qty 30

## 2023-12-18 MED ORDER — FESOTERODINE FUMARATE ER 4 MG PO TB24
4.0000 mg | ORAL_TABLET | Freq: Every day | ORAL | Status: DC
  Filled 2023-12-18: qty 1

## 2023-12-18 MED ORDER — CEFAZOLIN SODIUM-DEXTROSE 2-4 GM/100ML-% IV SOLN
2.0000 g | Freq: Three times a day (TID) | INTRAVENOUS | Status: DC
  Administered 2023-12-18 – 2023-12-19 (×3): 2 g via INTRAVENOUS
  Filled 2023-12-18 (×3): qty 100

## 2023-12-18 MED ORDER — ROCURONIUM BROMIDE 10 MG/ML (PF) SYRINGE
PREFILLED_SYRINGE | INTRAVENOUS | Status: DC | PRN
  Administered 2023-12-18: 60 mg via INTRAVENOUS
  Administered 2023-12-18: 30 mg via INTRAVENOUS
  Administered 2023-12-18: 40 mg via INTRAVENOUS

## 2023-12-18 MED ORDER — ACETAMINOPHEN 650 MG RE SUPP: 650.0000 mg | RECTAL | Status: DC | PRN

## 2023-12-18 MED ORDER — OXYCODONE-ACETAMINOPHEN 5-325 MG PO TABS
1.0000 | ORAL_TABLET | ORAL | Status: DC | PRN
  Administered 2023-12-18 – 2023-12-19 (×4): 2 via ORAL
  Filled 2023-12-18 (×4): qty 2

## 2023-12-18 MED ORDER — TAMSULOSIN HCL 0.4 MG PO CAPS
0.4000 mg | ORAL_CAPSULE | Freq: Every day | ORAL | Status: DC
  Administered 2023-12-18 – 2023-12-19 (×2): 0.4 mg via ORAL
  Filled 2023-12-18 (×2): qty 1

## 2023-12-18 MED ORDER — HYDROMORPHONE HCL 1 MG/ML IJ SOLN
INTRAMUSCULAR | Status: AC
  Filled 2023-12-18: qty 0.5

## 2023-12-18 MED ORDER — HYDROMORPHONE HCL 1 MG/ML IJ SOLN
INTRAMUSCULAR | Status: DC | PRN
  Administered 2023-12-18: .5 mg via INTRAVENOUS

## 2023-12-18 MED ORDER — MIDAZOLAM HCL 2 MG/2ML IJ SOLN
INTRAMUSCULAR | Status: DC | PRN
  Administered 2023-12-18: 2 mg via INTRAVENOUS

## 2023-12-18 MED ORDER — CHLORHEXIDINE GLUCONATE 0.12 % MT SOLN: 15.0000 mL | Freq: Once | OROMUCOSAL | Status: AC

## 2023-12-18 MED ORDER — ACETAMINOPHEN 325 MG PO TABS: 650.0000 mg | ORAL_TABLET | ORAL | Status: DC | PRN

## 2023-12-18 MED ORDER — SODIUM CHLORIDE 0.9% FLUSH: 3.0000 mL | INTRAVENOUS | Status: DC | PRN

## 2023-12-18 MED ORDER — ALUM & MAG HYDROXIDE-SIMETH 200-200-20 MG/5ML PO SUSP: 30.0000 mL | Freq: Four times a day (QID) | ORAL | Status: DC | PRN

## 2023-12-18 MED ORDER — MAGNESIUM 200 MG PO TABS: 400.0000 mg | ORAL_TABLET | Freq: Every morning | ORAL | Status: DC

## 2023-12-18 MED ORDER — SODIUM CHLORIDE 0.9 % IV SOLN
250.0000 mL | INTRAVENOUS | Status: DC
  Administered 2023-12-18: 250 mL via INTRAVENOUS

## 2023-12-18 MED ORDER — HYDROMORPHONE HCL 1 MG/ML IJ SOLN
INTRAMUSCULAR | Status: AC
  Filled 2023-12-18: qty 1

## 2023-12-18 MED ORDER — PHENYLEPHRINE HCL-NACL 20-0.9 MG/250ML-% IV SOLN
INTRAVENOUS | Status: DC | PRN
  Administered 2023-12-18: 30 ug/min via INTRAVENOUS

## 2023-12-18 MED ORDER — HYDROMORPHONE HCL 1 MG/ML IJ SOLN
0.5000 mg | INTRAMUSCULAR | Status: DC | PRN
  Administered 2023-12-18 – 2023-12-19 (×3): 0.5 mg via INTRAVENOUS
  Filled 2023-12-18 (×3): qty 0.5

## 2023-12-18 MED ORDER — FENTANYL CITRATE (PF) 250 MCG/5ML IJ SOLN
INTRAMUSCULAR | Status: DC | PRN
  Administered 2023-12-18: 100 ug via INTRAVENOUS

## 2023-12-18 MED ORDER — TADALAFIL 20 MG PO TABS: 20.0000 mg | ORAL_TABLET | Freq: Every day | ORAL | Status: DC

## 2023-12-18 MED ORDER — DEXAMETHASONE SODIUM PHOSPHATE 10 MG/ML IJ SOLN
INTRAMUSCULAR | Status: AC
  Filled 2023-12-18: qty 1

## 2023-12-18 MED ORDER — INSULIN ASPART 100 UNIT/ML IJ SOLN: 0.0000 [IU] | INTRAMUSCULAR | Status: DC | PRN

## 2023-12-18 MED ORDER — OXYCODONE-ACETAMINOPHEN 10-325 MG PO TABS
1.0000 | ORAL_TABLET | Freq: Four times a day (QID) | ORAL | Status: DC
Start: 2023-12-18 — End: 2023-12-18

## 2023-12-18 MED ORDER — MIDAZOLAM HCL 2 MG/2ML IJ SOLN
INTRAMUSCULAR | Status: AC
  Filled 2023-12-18: qty 2

## 2023-12-18 MED ORDER — LIDOCAINE 2% (20 MG/ML) 5 ML SYRINGE
INTRAMUSCULAR | Status: AC
Start: 2023-12-18 — End: 2023-12-18
  Filled 2023-12-18: qty 5

## 2023-12-18 MED ORDER — CEFAZOLIN SODIUM-DEXTROSE 2-4 GM/100ML-% IV SOLN
INTRAVENOUS | Status: AC
  Filled 2023-12-18: qty 100

## 2023-12-18 MED ORDER — ACETAMINOPHEN 500 MG PO TABS
ORAL_TABLET | ORAL | Status: AC
  Filled 2023-12-18: qty 2

## 2023-12-18 MED ORDER — FENTANYL CITRATE (PF) 250 MCG/5ML IJ SOLN
INTRAMUSCULAR | Status: AC
  Filled 2023-12-18: qty 5

## 2023-12-18 MED ORDER — ONDANSETRON HCL 4 MG/2ML IJ SOLN: 4.0000 mg | Freq: Four times a day (QID) | INTRAMUSCULAR | Status: DC | PRN

## 2023-12-18 MED ORDER — AMLODIPINE BESYLATE 10 MG PO TABS
10.0000 mg | ORAL_TABLET | Freq: Every day | ORAL | Status: DC
  Administered 2023-12-19: 10 mg via ORAL
  Filled 2023-12-18: qty 1

## 2023-12-18 MED ORDER — PROPOFOL 10 MG/ML IV BOLUS
INTRAVENOUS | Status: DC | PRN
  Administered 2023-12-18: 150 mg via INTRAVENOUS
  Administered 2023-12-18: 50 mg via INTRAVENOUS
  Administered 2023-12-18: 30 mg via INTRAVENOUS

## 2023-12-18 MED ORDER — LACTATED RINGERS IV SOLN
INTRAVENOUS | Status: DC
Start: 2023-12-18 — End: 2023-12-18

## 2023-12-18 MED ORDER — PANTOPRAZOLE SODIUM 40 MG IV SOLR: 40.0000 mg | Freq: Every day | INTRAVENOUS | Status: DC

## 2023-12-18 MED ORDER — LISINOPRIL 20 MG PO TABS
40.0000 mg | ORAL_TABLET | Freq: Every day | ORAL | Status: DC
  Administered 2023-12-19: 40 mg via ORAL
  Filled 2023-12-18: qty 2

## 2023-12-18 MED ORDER — INSULIN ASPART 100 UNIT/ML IJ SOLN
0.0000 [IU] | Freq: Three times a day (TID) | INTRAMUSCULAR | Status: DC
  Administered 2023-12-18: 2 [IU] via SUBCUTANEOUS
  Administered 2023-12-18: 3 [IU] via SUBCUTANEOUS

## 2023-12-18 MED ORDER — ACETAMINOPHEN 500 MG PO TABS
1000.0000 mg | ORAL_TABLET | Freq: Once | ORAL | Status: AC
  Administered 2023-12-18: 1000 mg via ORAL

## 2023-12-18 MED ORDER — OXYCODONE-ACETAMINOPHEN 5-325 MG PO TABS
1.0000 | ORAL_TABLET | Freq: Four times a day (QID) | ORAL | Status: DC
  Administered 2023-12-18: 1 via ORAL
  Filled 2023-12-18: qty 1

## 2023-12-18 MED ORDER — ASPIRIN 81 MG PO TBEC
81.0000 mg | DELAYED_RELEASE_TABLET | Freq: Every morning | ORAL | Status: DC
  Administered 2023-12-18 – 2023-12-19 (×2): 81 mg via ORAL
  Filled 2023-12-18 (×2): qty 1

## 2023-12-18 MED ORDER — AMISULPRIDE (ANTIEMETIC) 5 MG/2ML IV SOLN: 10.0000 mg | Freq: Once | INTRAVENOUS | Status: DC | PRN

## 2023-12-18 MED ORDER — KETAMINE HCL 50 MG/5ML IJ SOSY
PREFILLED_SYRINGE | INTRAMUSCULAR | Status: DC | PRN
Start: 2023-12-18 — End: 2023-12-18
  Administered 2023-12-18 (×2): 25 mg via INTRAVENOUS

## 2023-12-18 MED ORDER — ASPIRIN EFFERVESCENT 325 MG PO TBEF: 650.0000 mg | EFFERVESCENT_TABLET | Freq: Four times a day (QID) | ORAL | Status: DC | PRN

## 2023-12-18 MED ORDER — DOXYCYCLINE HYCLATE 100 MG PO TABS
100.0000 mg | ORAL_TABLET | Freq: Two times a day (BID) | ORAL | Status: DC
  Administered 2023-12-18 – 2023-12-19 (×2): 100 mg via ORAL
  Filled 2023-12-18 (×2): qty 1

## 2023-12-18 MED ORDER — HYDROMORPHONE HCL 1 MG/ML IJ SOLN
0.2500 mg | INTRAMUSCULAR | Status: DC | PRN
  Administered 2023-12-18: 0.5 mg via INTRAVENOUS

## 2023-12-18 MED ORDER — FENOFIBRATE 54 MG PO TABS
54.0000 mg | ORAL_TABLET | Freq: Every day | ORAL | Status: DC
  Filled 2023-12-18: qty 1

## 2023-12-18 MED ORDER — SODIUM CHLORIDE 0.9% FLUSH
3.0000 mL | Freq: Two times a day (BID) | INTRAVENOUS | Status: DC
  Administered 2023-12-18 – 2023-12-19 (×3): 3 mL via INTRAVENOUS

## 2023-12-18 MED ORDER — VARENICLINE TARTRATE 1 MG PO TABS
1.0000 mg | ORAL_TABLET | Freq: Two times a day (BID) | ORAL | Status: DC
  Administered 2023-12-18 – 2023-12-19 (×3): 1 mg via ORAL
  Filled 2023-12-18 (×3): qty 1

## 2023-12-18 MED ORDER — THROMBIN 20000 UNITS EX SOLR
CUTANEOUS | Status: AC
  Filled 2023-12-18: qty 20000

## 2023-12-18 MED ORDER — ROCURONIUM BROMIDE 10 MG/ML (PF) SYRINGE
PREFILLED_SYRINGE | INTRAVENOUS | Status: AC
  Filled 2023-12-18: qty 10

## 2023-12-18 MED ORDER — LACTATED RINGERS IV SOLN: INTRAVENOUS | Status: DC

## 2023-12-18 MED ORDER — THROMBIN 20000 UNITS EX SOLR
CUTANEOUS | Status: DC | PRN
  Administered 2023-12-18: 20 mL via TOPICAL

## 2023-12-18 MED ORDER — LORATADINE 10 MG PO TABS
10.0000 mg | ORAL_TABLET | Freq: Every day | ORAL | Status: DC
  Administered 2023-12-18 – 2023-12-19 (×2): 10 mg via ORAL
  Filled 2023-12-18 (×2): qty 1

## 2023-12-18 MED ORDER — OXYCODONE HCL 5 MG/5ML PO SOLN: 5.0000 mg | Freq: Once | ORAL | Status: DC | PRN

## 2023-12-18 MED ORDER — MAGNESIUM OXIDE -MG SUPPLEMENT 400 (240 MG) MG PO TABS
400.0000 mg | ORAL_TABLET | Freq: Every day | ORAL | Status: DC
  Administered 2023-12-18 – 2023-12-19 (×2): 400 mg via ORAL
  Filled 2023-12-18 (×2): qty 1

## 2023-12-18 SURGICAL SUPPLY — 56 items
BAG COUNTER SPONGE SURGICOUNT (BAG) ×1 IMPLANT
BENZOIN TINCTURE PRP APPL 2/3 (GAUZE/BANDAGES/DRESSINGS) ×1 IMPLANT
BLADE CLIPPER SURG (BLADE) IMPLANT
BLADE SURG 11 STRL SS (BLADE) ×1 IMPLANT
BUR MATCHSTICK NEURO 3.0 LAGG (BURR) ×1 IMPLANT
BUR PRECISION FLUTE 6.0 (BURR) ×1 IMPLANT
CANISTER SUCTION 3000ML PPV (SUCTIONS) ×1 IMPLANT
CNTNR URN SCR LID CUP LEK RST (MISCELLANEOUS) ×1 IMPLANT
COVER BACK TABLE 24X17X13 BIG (DRAPES) IMPLANT
COVER BACK TABLE 60X90IN (DRAPES) ×1 IMPLANT
DERMABOND ADVANCED .7 DNX12 (GAUZE/BANDAGES/DRESSINGS) ×1 IMPLANT
DRAPE C-ARM 42X72 X-RAY (DRAPES) ×2 IMPLANT
DRAPE HALF SHEET 40X57 (DRAPES) IMPLANT
DRAPE LAPAROTOMY 100X72X124 (DRAPES) ×1 IMPLANT
DRAPE POUCH INSTRU U-SHP 10X18 (DRAPES) ×1 IMPLANT
DRAPE SURG 17X23 STRL (DRAPES) ×1 IMPLANT
DRSG OPSITE POSTOP 4X8 (GAUZE/BANDAGES/DRESSINGS) IMPLANT
DURAPREP 26ML APPLICATOR (WOUND CARE) ×1 IMPLANT
ELECTRODE BLDE 4.0 EZ CLN MEGD (MISCELLANEOUS) IMPLANT
ELECTRODE REM PT RTRN 9FT ADLT (ELECTROSURGICAL) ×1 IMPLANT
EVACUATOR 1/8 PVC DRAIN (DRAIN) ×1 IMPLANT
GAUZE 4X4 16PLY ~~LOC~~+RFID DBL (SPONGE) IMPLANT
GAUZE SPONGE 4X4 12PLY STRL (GAUZE/BANDAGES/DRESSINGS) ×1 IMPLANT
GLOVE BIO SURGEON STRL SZ7 (GLOVE) IMPLANT
GLOVE BIO SURGEON STRL SZ8 (GLOVE) ×2 IMPLANT
GLOVE BIOGEL PI IND STRL 7.0 (GLOVE) IMPLANT
GLOVE EXAM NITRILE XL STR (GLOVE) IMPLANT
GLOVE INDICATOR 8.5 STRL (GLOVE) ×4 IMPLANT
GOWN STRL REUS W/ TWL LRG LVL3 (GOWN DISPOSABLE) IMPLANT
GOWN STRL REUS W/ TWL XL LVL3 (GOWN DISPOSABLE) ×2 IMPLANT
GOWN STRL REUS W/TWL 2XL LVL3 (GOWN DISPOSABLE) IMPLANT
GRAFT BN 5X1XSPNE CVD POST DBM (Bone Implant) IMPLANT
GRAFT BNE MATRIX VG FRMBL L 10 (Bone Implant) IMPLANT
KIT BASIN OR (CUSTOM PROCEDURE TRAY) ×1 IMPLANT
KIT INFUSE SMALL (Orthopedic Implant) IMPLANT
KIT TURNOVER KIT B (KITS) ×1 IMPLANT
NDL HYPO 21X1.5 SAFETY (NEEDLE) IMPLANT
NDL HYPO 25X1 1.5 SAFETY (NEEDLE) ×1 IMPLANT
NEEDLE HYPO 21X1.5 SAFETY (NEEDLE) ×1 IMPLANT
NEEDLE HYPO 25X1 1.5 SAFETY (NEEDLE) ×1 IMPLANT
NS IRRIG 1000ML POUR BTL (IV SOLUTION) ×1 IMPLANT
PACK LAMINECTOMY NEURO (CUSTOM PROCEDURE TRAY) ×1 IMPLANT
PAD ARMBOARD POSITIONER FOAM (MISCELLANEOUS) ×3 IMPLANT
PENCIL BUTTON HOLSTER BLD 10FT (ELECTRODE) IMPLANT
SPIKE FLUID TRANSFER (MISCELLANEOUS) ×1 IMPLANT
SPONGE SURGIFOAM ABS GEL 100 (HEMOSTASIS) ×1 IMPLANT
SPONGE T-LAP 4X18 ~~LOC~~+RFID (SPONGE) IMPLANT
STRIP CLOSURE SKIN 1/2X4 (GAUZE/BANDAGES/DRESSINGS) ×2 IMPLANT
SUT VIC AB 0 CT1 18XCR BRD8 (SUTURE) ×2 IMPLANT
SUT VIC AB 2-0 CT1 18 (SUTURE) ×1 IMPLANT
SUT VIC AB 4-0 PS2 18 (SUTURE) ×1 IMPLANT
SYR 20ML LL LF (SYRINGE) IMPLANT
TOWEL GREEN STERILE (TOWEL DISPOSABLE) ×1 IMPLANT
TOWEL GREEN STERILE FF (TOWEL DISPOSABLE) ×1 IMPLANT
TRAY FOLEY MTR SLVR 16FR STAT (SET/KITS/TRAYS/PACK) ×1 IMPLANT
WATER STERILE IRR 1000ML POUR (IV SOLUTION) ×1 IMPLANT

## 2023-12-18 NOTE — Anesthesia Postprocedure Evaluation (Signed)
 Anesthesia Post Note  Patient: Harold [name]  Procedure(s) Performed: Lumbar Four-Five with Exploration of Fusion with Removal of Hardware Redo Posterolateral Arthrodesis Non Instrumented (Back)     Patient location during evaluation: PACU Anesthesia Type: General Level of consciousness: awake Pain management: pain level controlled Vital Signs Assessment: post-procedure vital signs reviewed and stable Respiratory status: spontaneous breathing, nonlabored ventilation and respiratory function stable Cardiovascular status: blood pressure returned to baseline and stable Postop Assessment: no apparent nausea or vomiting Anesthetic complications: no   No notable events documented.  Last Vitals:  Vitals:   12/18/23 1115 12/18/23 1147  BP: 118/74 123/71  Pulse: (!) 54 (!) 53  Resp: (!) 22 18  Temp: 36.9 C 36.6 C  SpO2: 98% 98%    Last Pain:  Vitals:   12/18/23 1147  TempSrc: Oral  PainSc:     LLE Motor Response: Purposeful movement (12/18/23 1154) LLE Sensation: Numbness (Neuropaythy) (12/18/23 1154) RLE Motor Response: Purposeful movement (12/18/23 1154) RLE Sensation: Numbness (Neuropaythy) (12/18/23 1154)      Delon Aisha Arch

## 2023-12-18 NOTE — Transfer of Care (Signed)
 Immediate Anesthesia Transfer of Care Note  Patient: Harold [name]  Procedure(s) Performed: Lumbar Four-Five with Exploration of Fusion with Removal of Hardware Redo Posterolateral Arthrodesis Non Instrumented (Back)  Patient Location: PACU  Anesthesia Type:General  Level of Consciousness: awake, alert , and drowsy  Airway & Oxygen Therapy: Patient Spontanous Breathing and Patient connected to face mask oxygen  Post-op Assessment: Report given to RN, Post -op Vital signs reviewed and stable, and Patient moving all extremities X 4  Post vital signs: Reviewed and stable  Last Vitals:  Vitals Value Taken Time  BP 132/79 12/18/23 10:17  Temp    Pulse 63 12/18/23 10:20  Resp 18 12/18/23 10:20  SpO2 100 % 12/18/23 10:20  Vitals shown include unfiled device data.  Last Pain:  Vitals:   12/18/23 0627  TempSrc: (P) Oral         Complications: No notable events documented.

## 2023-12-18 NOTE — H&P (Signed)
 Harold [name] is an 58 y.o. male.   Chief Complaint: Back bilateral hip and leg pain HPI: 58 year old gentleman status post L3-L5 fusion presented with progressive worsening back pain workup revealed loosening of his pedicle screws with partial and interbody fusion.  Patient failed all forms conservative treatment and due to his progression of clinical syndrome imaging findings of a conservative treatment I recommended exploration fusion removal of hardware and redo posterolateral arthrodesis.  I recommended not replacing instrumentation as knees now had 2 episodes where he loosened the screws.  Does appear that he has a least a functional fusion with interbody bone and some posterior lateral fusion.  I gone over the risks benefits perioperative course expectations of outcome and alternatives and he understands and agrees to proceed forward.  Past Medical History:  Diagnosis Date   Acute idiopathic pericarditis 04/08/2010   Qualifier: Diagnosis of  By: Lelon RIGGERS, [name]     Acute sinusitis 06/04/2014   Anxiety    CHF (congestive heart failure) (HCC) 06/29/2010   was pericardititis not CHF   Chronic back pain    Constipation    DDD (degenerative disc disease), lumbar 09/14/2012   Depression    pt denies   Erectile dysfunction 09/14/2012   GERD (gastroesophageal reflux disease)    ocassional   Gout, unspecified 05/01/2007   Qualifier: Diagnosis of  By: Krystal MD, Reyes A    Headache    migraines in the past   History of pancreatitis    a. admx 04-2009.SABRASABRA? 2-2 triglycerides   Hypertension    Hypertriglyceridemia    a. followed by [facility]   Left sided sciatica 09/14/2012   Neuropathy    Obesity    OSA (obstructive sleep apnea)    CPAP- not current   Restless legs    Type 2 diabetes mellitus with diabetic neuropathy, unspecified (HCC) 09/20/2019    Past Surgical History:  Procedure Laterality Date   ANAL INTRAEPITHELIAL NEOPLASIA EXCISION Left 02/15/2023   Procedure:  EXCISION SEBACEOUS CYST LEFT AXILLA;  Surgeon: Curvin Deward MOULD, MD;  Location: [facility];  Service: General;  Laterality: Left;   ANTERIOR CERVICAL DECOMP/DISCECTOMY FUSION N/A 09/14/2020   Procedure: Anterior Cervical Decompression Fusion - Cervical three-Cervical four;  Surgeon: [name], MD;  Location: [facility] OR;  Service: Neurosurgery;  Laterality: N/A;   BACK SURGERY  2021   lumbar fusion   COLONOSCOPY     ELBOW SURGERY Left    KNEE SURGERY Right    x3 scopes   KNEE SURGERY Right    open menisicus repair   LUMBAR LAMINECTOMY/DECOMPRESSION MICRODISCECTOMY N/A 08/26/2015   Procedure: Lumbar Two-Sacral One  Laminectomy for decompression;  Surgeon: Morene Hicks Ditty, MD;  Location: [facility];  Service: Neurosurgery;  Laterality: N/A;   pericardectomy  07/15/2010   Dr. Army   pleurx catheter placement  07/15/2010   Dr. Army   SHOULDER SURGERY     right and left shoulders- Rotator cuff repair   SHOULDER SURGERY Right 2023   TONSILLECTOMY AND ADENOIDECTOMY     one tonsil    Family History  Problem Relation Age of Onset   Diabetes Mother    Diabetes Father    Colon cancer Father 62   Heart disease Father 7       CABG   Pancreatitis Father    Hypertension Other        entire family   Liver cancer Paternal Uncle    Diabetes Sister  Stomach cancer Maternal Grandfather    Pancreatitis Paternal Uncle        x 2   Esophageal cancer Neg Hx    Rectal cancer Neg Hx    Colon polyps Neg Hx    Social History:  reports that he has been smoking cigarettes. He has a 18 pack-year smoking history. He quit smokeless tobacco use about 4 years ago.  His smokeless tobacco use included snuff. He reports that he does not currently use alcohol . He reports that he does not use drugs.  Allergies:  Allergies  Allergen Reactions   Beta Adrenergic Blockers Other (See Comments)    Erectile dysfxn   Diphenhydramine Palpitations and Other (See Comments)    jittery Pt  states he can take Benadryl in an emergency situation   [facility] Itching and Swelling    swelling of tongue   Colchicine Diarrhea   Lipitor [Atorvastatin ] Other (See Comments)    Muscle cramps   Pecan Nut (Diagnostic) Palpitations    Pecans: itching and swelling of the tongue    Septra  [Sulfamethoxazole -Trimethoprim ] Itching    Medications Prior to Admission  Medication Sig Dispense Refill   AMBULATORY NON FORMULARY MEDICATION Medication Name: Diltiazem 2% gel:Lidocaine  5% - Using your index finger, you should apply a small amount of medication inside the rectum up to your first knuckle/joint 3-4 times daily x 6-8 weeks. (Patient taking differently: Apply 1 Application topically daily as needed. Medication Name: Diltiazem 2% gel:Lidocaine  5% - Using your index finger, you should apply a small amount of medication inside the rectum up to your first knuckle/joint 3-4 times daily x 6-8 weeks.) 45 g 2   amLODipine  (NORVASC ) 10 MG tablet TAKE 1 TABLET(10 MG) BY MOUTH DAILY 90 tablet 3   Aromatic Inhalants (VICKS VAPOINHALER IN) Inhale 1 puff into the lungs daily as needed (congestion).     aspirin  EC 81 MG tablet Take 81 mg by mouth in the morning.     aspirin -sod bicarb-citric acid (ALKA-SELTZER) 325 MG TBEF tablet Take 650 mg by mouth every 6 (six) hours as needed (indigestion).     citalopram  (CELEXA ) 40 MG tablet TAKE 1 TABLET(40 MG) BY MOUTH DAILY 90 tablet 3   diclofenac  Sodium (VOLTAREN ) 1 % GEL Apply 4 g topically 4 (four) times daily as needed (Pain).     doxycycline  (VIBRA -TABS) 100 MG tablet Take 1 tablet (100 mg total) by mouth 2 (two) times daily. Take twice daily with food 60 tablet 0   fenofibrate  (TRICOR ) 145 MG tablet TAKE 1 TABLET(145 MG) BY MOUTH DAILY 90 tablet 3   gabapentin  (NEURONTIN ) 300 MG capsule Take 900 mg by mouth 3 (three) times daily.     Garlic (GARLIQUE PO) Take 1 capsule by mouth in the morning.     ibuprofen  (ADVIL ) 800 MG tablet Take 800 mg by mouth 3 (three)  times daily.     lisinopril  (ZESTRIL ) 40 MG tablet TAKE 1 TABLET(40 MG) BY MOUTH DAILY 90 tablet 3   loratadine  (CLARITIN ) 10 MG tablet TAKE 1 TABLET(10 MG) BY MOUTH DAILY AS NEEDED FOR ALLERGIES (Patient taking differently: Take 10 mg by mouth daily.) 90 tablet 3   Magnesium  200 MG TABS Take 400 mg by mouth in the morning.     Misc Natural Products (BEET ROOT PO) Take 1 tablet by mouth in the morning and at bedtime. Total Beets     omeprazole  (PRILOSEC) 40 MG capsule Take 1 capsule (40 mg total) by mouth daily. 90 capsule 3  PERCOCET 10-325 MG tablet Take 1 tablet by mouth every 6 (six) hours.     Phenylephrine -Acetaminophen  (TYLENOL  SINUS CONGESTION/PAIN PO) Take 2 tablets by mouth 3 (three) times daily as needed (congestion).     Polyethyl Glycol-Propyl Glycol (LUBRICANT EYE DROPS) 0.4-0.3 % SOLN Place 1-2 drops into both eyes 3 (three) times daily as needed (dry/irritated eyes.).     Potassium Chloride  ER 20 MEQ TBCR TAKE 1 TABLET BY MOUTH DAILY 90 tablet 3   rosuvastatin  (CRESTOR ) 20 MG tablet TAKE 1 TABLET(20 MG) BY MOUTH DAILY 90 tablet 3   tadalafil  (CIALIS ) 20 MG tablet Take 0.5-1 tablets (10-20 mg total) by mouth every other day as needed for erectile dysfunction. 10 tablet 11   tamsulosin  (FLOMAX ) 0.4 MG CAPS capsule TAKE 1 CAPSULE(0.4 MG) BY MOUTH DAILY 90 capsule 3   tirzepatide  (MOUNJARO ) 12.5 MG/0.5ML Pen Inject 12.5 mg into the skin once a week. (Patient taking differently: Inject 12.5 mg into the skin every Friday.) 6 mL 3   tiZANidine  (ZANAFLEX ) 4 MG tablet Take 4 mg by mouth 3 (three) times daily as needed for muscle spasms.     tolterodine  (DETROL  LA) 4 MG 24 hr capsule Take 1 capsule (4 mg total) by mouth daily. (Patient taking differently: Take 4 mg by mouth daily as needed (Urgency).) 90 capsule 3   varenicline  (CHANTIX  CONTINUING MONTH PAK) 1 MG tablet Take 1 tablet (1 mg total) by mouth 2 (two) times daily. 60 tablet 2   Blood Glucose Monitoring Suppl (ONE TOUCH ULTRA 2)  w/Device KIT Use as directed daily E11.9 1 each 0   Cholecalciferol  (VITAMIN D3 PO) Take 1 capsule by mouth daily.     Continuous Blood Gluc Receiver (DEXCOM G7 RECEIVER) DEVI Use as directed once per day E11.9 1 each 0   Continuous Blood Gluc Sensor (DEXCOM G7 SENSOR) MISC Use as directed once every 2 weeks E11.9 6 each 3   fluticasone  (FLONASE ) 50 MCG/ACT nasal spray Place 2 sprays into both nostrils daily. (Patient taking differently: Place 2 sprays into both nostrils daily as needed for allergies.) 16 g 6   furosemide  (LASIX ) 40 MG tablet Take 40 mg by mouth in the morning.     glucose blood test strip Use as instructed once daily E119 100 each 12   hydrochlorothiazide  (MICROZIDE ) 12.5 MG capsule Take 1 capsule (12.5 mg total) by mouth daily. (Patient taking differently: Take 12.5 mg by mouth daily as needed (Edema).) 90 capsule 3   hydroquinone  4 % cream Apply to affected areas nightly for 3 months - then discontinue. 28.35 g 1   iron  polysaccharides (NU-IRON ) 150 MG capsule Take 1 capsule (150 mg total) by mouth daily. 90 capsule 1   Lancets MISC Use as directed daily E11.9 100 each 11    Results for orders placed or performed during the hospital encounter of 12/18/23 (from the past 48 hours)  Glucose, capillary     Status: Abnormal   Collection Time: 12/18/23  6:26 AM  Result Value Ref Range   Glucose-Capillary 120 (H) 70 - 99 mg/dL    Comment: Glucose reference range applies only to samples taken after fasting for at least 8 hours.   *Note: Due to a large number of results and/or encounters for the requested time period, some results have not been displayed. A complete set of results can be found in Results Review.   No results found.  Review of Systems  Musculoskeletal:  Positive for back pain.    Blood  pressure (!) (P) 183/90, pulse (!) (P) 55, temperature (P) 97.9 F (36.6 C), temperature source (P) Oral, resp. rate (P) 18, height (P) 5' 11 (1.803 m), weight (P) 107.4 kg,  SpO2 (P) 99%. Physical Exam HENT:     Head: Normocephalic.     Right Ear: Tympanic membrane normal.     Nose: Nose normal.  Cardiovascular:     Rate and Rhythm: Normal rate.     Pulses: Normal pulses.  Pulmonary:     Effort: Pulmonary effort is normal.  Musculoskeletal:        General: Normal range of motion.     Cervical back: Normal range of motion.  Neurological:     Mental Status: He is alert.     Comments: Strength is 5 out of 5 iliopsoas, quads, hamstrings, gastrocs, tibialis, EHL.      Assessment/Plan Patient presents for expiration fusion removal of hardware and redo posterolateral arthrodesis.  Arley SHAUNNA Helling, MD 12/18/2023, 7:26 AM

## 2023-12-18 NOTE — Op Note (Addendum)
 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  Postoperative diagnosis: Same.  Procedure: Exploration fusion removal of hardware L4-5 with redo posterolateral arthrodesis L4-5 utilizing locally harvested autograft mixed with Vivigen BMP and Magnifuse.  Surgeon: Arley helling.  Assistant: Suzen Click.  Anesthesia: General.  EBL: Minimal.  HPI: 58 year old gentleman previously undergone and L3-L5 fusion and had hardware loosening of his 3 screws and those removed before and now presented with increased back pain and an evidence of loosening of his L4 screws.  Fusion did appear to have progressed to some degree in the interbody spaces and questionably posterior laterally.  But due to his loose L4 screws his persistence and progressive back pain and failed conservative treatment I recommended exploration fusion move of hardware and possible redo posterolateral arthrodesis.  I extensively went over the risks and benefits of the operation with him as well as perioperative course expectations of outcome and alternatives to surgery and he understood and agreed to proceed forward.  Operative procedure: Patient was brought into the OR was induced under general anesthesia positioned prone on the Wilson frame his back was prepped and draped in routine sterile fashion his incision was infiltrated with 10 cc lidocaine  with epi and the midline incision was made and the scar tissue was dissected free with Bovie electrocautery.  I meetly palpated and identified the old hardware and construct so exposed the old screws and cross-link and removed them progressively.  The L4 screws were markedly loose as well as there was some loosening of the left L5 screw.  I then dissected through the scar tissue exposed some posterolateral bone but also the TPs at L4 and L5.  There did appear to be at least partial fusion the construct or the segment moved as a unit however some of the posterolateral bone was  mobile and some of it appeared to be fused but I did not feel that the patient had a compelling unstable pseudoarthrosis so I did not feel it was in his best interest to put any additional hardware and at the risk of additional loosening.  However I did aggressively decorticate the TPs at L4-5 the posterolateral bone that was in place and the lateral facet joints around the pedicle holes where I removed the screws.  Then I packed BMP Vivigen and Magnifuse posterior laterally bilaterally trying to augment the pre-existing fusion.  I then placed a medium Hemovac drain injected Exparel  in the fascia I sewed muscle layer over the Magnifuse sponges and then closed the wound in layers with interrupted Vicryl and a running 4 subcuticular.  Dermabond benzoin Steri-Strips and sterile dressing was applied patient to cover him in stable condition.  At the end the case all needle count sponge counts were correct.

## 2023-12-18 NOTE — Anesthesia Procedure Notes (Signed)
 Procedure Name: Intubation Date/Time: 12/18/2023 8:01 AM  Performed by: Mollie Olivia SAUNDERS, CRNAPre-anesthesia Checklist: Patient identified, Emergency Drugs available, Suction available and Patient being monitored Patient Re-evaluated:Patient Re-evaluated prior to induction Oxygen Delivery Method: Circle system utilized Preoxygenation: Pre-oxygenation with 100% oxygen Induction Type: IV induction Ventilation: Oral airway inserted - appropriate to patient size Laryngoscope Size: Glidescope and 4 Grade View: Grade I Tube type: Oral Tube size: 7.5 mm Number of attempts: 1 Airway Equipment and Method: Stylet, Oral airway and Rigid stylet Placement Confirmation: ETT inserted through vocal cords under direct vision, positive ETCO2 and breath sounds checked- equal and bilateral Secured at: 23 cm Tube secured with: Tape Dental Injury: Teeth and Oropharynx as per pre-operative assessment  Difficulty Due To: Difficulty was anticipated and Difficult Airway-  due to neck instability Comments: Pt has a appears to have a nodule on the left tonsilar area visualized but not obstructive

## 2023-12-18 NOTE — Plan of Care (Signed)

## 2023-12-19 ENCOUNTER — Encounter (HOSPITAL_COMMUNITY): Payer: Self-pay | Admitting: Neurosurgery

## 2023-12-19 LAB — GLUCOSE, CAPILLARY: Glucose-Capillary: 107 mg/dL — ABNORMAL HIGH (ref 70–99)

## 2023-12-19 MED ORDER — OXYCODONE-ACETAMINOPHEN 5-325 MG PO TABS: 1.0000 | ORAL_TABLET | ORAL | 0 refills | Status: AC | PRN

## 2023-12-19 NOTE — Progress Notes (Signed)
 Patient alert and oriented, mae's well, voiding adequate amount of urine, swallowing without difficulty, no c/o pain at time of discharge. Patient discharged home with wife. Script and discharged instructions given to patient. Patient and wife stated understanding of instructions given. Room was checked and accounted for all patient's belongings; discharge instructions concerning his medications, incision care, follow up appointment and when to call the doctor as needed were all discussed with patient by RN and he expressed understanding on the instructions given.

## 2023-12-19 NOTE — Evaluation (Signed)
 Occupational Therapy Evaluation Patient Details Name: Harold [name] MRN: [id number] DOB: Sep 04, 1965 Today's Date: 12/19/2023   History of Present Illness   Pt is a 58 y.o. male who presented for L3-5 revision of spinal fusion redo foraminotomy. PMH: CHF, HTN, neuropathy, OSA on CPAP, T2DM, hx of lumbar fusion, hx of ACDF, gout.     Clinical Impressions PTA, pt lives with significant other, typically Modified Independent with ADLs, IADLs and mobility with intermittent cane use due to back pain. Pt presents now fairly close to this reported baseline, able to complete LB ADLs with Modified Independence and mobilize to/from bathroom w/ IV pole as a support. No LOB or safety concerns noted. Pt reports significant other can assist as needed at DC and verbalizes understanding of back precautions. No further skilled OT services needed at this time.  Recommend follow up as prescribed by MD.       If plan is discharge home, recommend the following:   Assist for transportation     Functional Status Assessment   Patient has not had a recent decline in their functional status     Equipment Recommendations   None recommended by OT     Recommendations for Other Services         Precautions/Restrictions   Precautions Precautions: Back Precaution Booklet Issued: Yes (comment) Recall of Precautions/Restrictions: Intact Required Braces or Orthoses: Spinal Brace Spinal Brace: Lumbar corset Restrictions Weight Bearing Restrictions Per Provider Order: No     Mobility Bed Mobility Overal bed mobility: Modified Independent                  Transfers Overall transfer level: Modified independent                        Balance Overall balance assessment: Mild deficits observed, not formally tested                                         ADL either performed or assessed with clinical judgement   ADL Overall ADL's : At baseline                                              Vision Patient Visual Report: No change from baseline       Perception Perception: Within Functional Limits       Praxis Praxis: WFL       Pertinent Vitals/Pain Pain Assessment Pain Assessment: Faces Faces Pain Scale: Hurts little more Pain Location: Incisional Pain Descriptors / Indicators: Aching Pain Intervention(s): Monitored during session     Extremity/Trunk Assessment Upper Extremity Assessment Upper Extremity Assessment: Overall WFL for tasks assessed   Lower Extremity Assessment Lower Extremity Assessment: Overall WFL for tasks assessed   Cervical / Trunk Assessment Cervical / Trunk Assessment: Back Surgery   Communication Communication Communication: No apparent difficulties   Cognition Arousal: Alert Behavior During Therapy: WFL for tasks assessed/performed Cognition: No apparent impairments                               Following commands: Intact       Cueing  General Comments   Cueing Techniques: Verbal cues   VSS   Exercises  Shoulder Instructions      Home Living Family/patient expects to be discharged to:: Private residence Living Arrangements: Spouse/significant other;Other relatives Available Help at Discharge: Family;Available PRN/intermittently Type of Home: House Home Access: Stairs to enter [employer] of Steps: 3 Entrance Stairs-Rails: Right;Left;Can reach both Home Layout: Two level;Able to live on main level with bedroom/bathroom   Alternate Level Stairs-Rails: Right;Left;Can reach both Bathroom Shower/Tub: Chief Strategy Officer: Handicapped height Bathroom Accessibility: Yes How Accessible: Accessible via walker Home Equipment: Rollator (4 wheels);Shower seat;Electric scooter          Prior Functioning/Environment Prior Level of Function : Independent/Modified Independent                    OT Problem List: Pain    OT Treatment/Interventions:        OT Goals(Current goals can be found in the care plan section)   Acute Rehab OT Goals Patient Stated Goal: Return home OT Goal Formulation: With patient Time For Goal Achievement: 12/22/23 Potential to Achieve Goals: Good   OT Frequency:       Co-evaluation              AM-PAC OT 6 Clicks Daily Activity     Outcome Measure Help from another person eating meals?: None Help from another person taking care of personal grooming?: None Help from another person toileting, which includes using toliet, bedpan, or urinal?: None Help from another person bathing (including washing, rinsing, drying)?: None Help from another person to put on and taking off regular upper body clothing?: None Help from another person to put on and taking off regular lower body clothing?: None 6 Click Score: 24   End of Session Nurse Communication: Mobility status  Activity Tolerance: Patient tolerated treatment well Patient left: in bed  OT Visit Diagnosis: Pain                Time: 9169-9150 OT Time Calculation (min): 19 min Charges:  OT General Charges $OT Visit: 1 Visit OT Evaluation $OT Eval Moderate Complexity: 1 Mod  12/19/2023  RP, OTR/L  Acute Rehabilitation Services  Office:  [phone]   Charlie JONETTA Halsted 12/19/2023, 8:54 AM

## 2023-12-19 NOTE — Discharge Summary (Signed)
 Physician Discharge Summary  Patient ID: Harold [name] MRN: [id number] DOB/AGE: [birth year] 58 y.o. Estimated body mass index is 33.03 kg/m (pended) as calculated from the following:   Height as of this encounter: (P) 5' 11 (1.803 m).   Weight as of this encounter: (P) 107.4 kg.   Admit date: 12/18/2023 Discharge date: 12/19/2023  Admission Diagnoses: Pseudoarthrosis L4-5  Discharge Diagnoses: Same Principal Problem:   Pseudoarthrosis of lumbar spine   Discharged Condition: good  Hospital Course: Patient admitted to hospital underwent exploration of fusion move of hardware postoperatively patient did very well when covering the floor on the floor was ambulating voiding spontaneously tolerating regular diet and stable for discharge home.  Consults: Significant Diagnostic Studies: Treatments: Expiration fusion move of hardware L4-5 Discharge Exam: Blood pressure 136/79, pulse (!) 59, temperature 98.2 F (36.8 C), temperature source Oral, resp. rate 20, height (P) 5' 11 (1.803 m), weight (P) 107.4 kg, SpO2 96%. Strength 5 out of 5 wound clean dry and intact  Disposition: Home   Allergies as of 12/19/2023       Reactions   Beta Adrenergic Blockers Other (See Comments)   Erectile dysfxn   Diphenhydramine Palpitations, Other (See Comments)   jittery Pt states he can take Benadryl in an emergency situation   [facility] Itching, Swelling   swelling of tongue   Colchicine Diarrhea   Lipitor [atorvastatin ] Other (See Comments)   Muscle cramps   Pecan Nut (diagnostic) Palpitations   Pecans: itching and swelling of the tongue    Septra  [sulfamethoxazole -trimethoprim ] Itching        Medication List     TAKE these medications    AMBULATORY NON FORMULARY MEDICATION Medication Name: Diltiazem 2% gel:Lidocaine  5% - Using your index finger, you should apply a small amount of medication inside the rectum up to your first knuckle/joint 3-4 times daily x 6-8 weeks. What  changed:  how much to take how to take this when to take this reasons to take this   amLODipine  10 MG tablet Commonly known as: NORVASC  TAKE 1 TABLET(10 MG) BY MOUTH DAILY   aspirin  EC 81 MG tablet Take 81 mg by mouth in the morning.   aspirin -sod bicarb-citric acid 325 MG Tbef tablet Commonly known as: ALKA-SELTZER Take 650 mg by mouth every 6 (six) hours as needed (indigestion).   BEET ROOT PO Take 1 tablet by mouth in the morning and at bedtime. Total Beets   citalopram  40 MG tablet Commonly known as: CELEXA  TAKE 1 TABLET(40 MG) BY MOUTH DAILY   Dexcom G7 Receiver Devi Use as directed once per day E11.9   Dexcom G7 Sensor Misc Use as directed once every 2 weeks E11.9   diclofenac  Sodium 1 % Gel Commonly known as: VOLTAREN  Apply 4 g topically 4 (four) times daily as needed (Pain).   doxycycline  100 MG tablet Commonly known as: VIBRA -TABS Take 1 tablet (100 mg total) by mouth 2 (two) times daily. Take twice daily with food   fenofibrate  145 MG tablet Commonly known as: TRICOR  TAKE 1 TABLET(145 MG) BY MOUTH DAILY   fluticasone  50 MCG/ACT nasal spray Commonly known as: FLONASE  Place 2 sprays into both nostrils daily. What changed:  when to take this reasons to take this   furosemide  40 MG tablet Commonly known as: LASIX  Take 40 mg by mouth in the morning.   gabapentin  300 MG capsule Commonly known as: NEURONTIN  Take 900 mg by mouth 3 (three) times daily.   GARLIQUE PO Take 1 capsule by  mouth in the morning.   glucose blood test strip Use as instructed once daily E119   hydrochlorothiazide  12.5 MG capsule Commonly known as: Microzide  Take 1 capsule (12.5 mg total) by mouth daily. What changed:  when to take this reasons to take this   hydroquinone  4 % cream Apply to affected areas nightly for 3 months - then discontinue.   ibuprofen  800 MG tablet Commonly known as: ADVIL  Take 800 mg by mouth 3 (three) times daily.   iron  polysaccharides 150  MG capsule Commonly known as: Nu-Iron  Take 1 capsule (150 mg total) by mouth daily.   Lancets Misc Use as directed daily E11.9   lisinopril  40 MG tablet Commonly known as: ZESTRIL  TAKE 1 TABLET(40 MG) BY MOUTH DAILY   loratadine  10 MG tablet Commonly known as: CLARITIN  TAKE 1 TABLET(10 MG) BY MOUTH DAILY AS NEEDED FOR ALLERGIES What changed:  how much to take how to take this when to take this additional instructions   Lubricant Eye Drops 0.4-0.3 % Soln Generic drug: Polyethyl Glycol-Propyl Glycol Place 1-2 drops into both eyes 3 (three) times daily as needed (dry/irritated eyes.).   Magnesium  200 MG Tabs Take 400 mg by mouth in the morning.   omeprazole  40 MG capsule Commonly known as: PRILOSEC Take 1 capsule (40 mg total) by mouth daily.   ONE TOUCH ULTRA 2 w/Device Kit Use as directed daily E11.9   Percocet 10-325 MG tablet Generic drug: oxyCODONE -acetaminophen  Take 1 tablet by mouth every 6 (six) hours. What changed: Another medication with the same name was added. Make sure you understand how and when to take each.   oxyCODONE -acetaminophen  5-325 MG tablet Commonly known as: PERCOCET/ROXICET Take 1-2 tablets by mouth every 4 (four) hours as needed for moderate pain (pain score 4-6) or severe pain (pain score 7-10). What changed: You were already taking a medication with the same name, and this prescription was added. Make sure you understand how and when to take each.   Potassium Chloride  ER 20 MEQ Tbcr TAKE 1 TABLET BY MOUTH DAILY   rosuvastatin  20 MG tablet Commonly known as: CRESTOR  TAKE 1 TABLET(20 MG) BY MOUTH DAILY   tadalafil  20 MG tablet Commonly known as: CIALIS  Take 0.5-1 tablets (10-20 mg total) by mouth every other day as needed for erectile dysfunction.   tamsulosin  0.4 MG Caps capsule Commonly known as: FLOMAX  TAKE 1 CAPSULE(0.4 MG) BY MOUTH DAILY   tirzepatide  12.5 MG/0.5ML Pen Commonly known as: MOUNJARO  Inject 12.5 mg into the skin  once a week. What changed: when to take this   tiZANidine  4 MG tablet Commonly known as: ZANAFLEX  Take 4 mg by mouth 3 (three) times daily as needed for muscle spasms.   tolterodine  4 MG 24 hr capsule Commonly known as: DETROL  LA Take 1 capsule (4 mg total) by mouth daily. What changed:  when to take this reasons to take this   TYLENOL  SINUS CONGESTION/PAIN PO Take 2 tablets by mouth 3 (three) times daily as needed (congestion).   varenicline  1 MG tablet Commonly known as: Chantix  Continuing Month Pak Take 1 tablet (1 mg total) by mouth 2 (two) times daily.   VICKS VAPOINHALER IN Inhale 1 puff into the lungs daily as needed (congestion).   VITAMIN D3 PO Take 1 capsule by mouth daily.         Signed: Arley SHAUNNA Helling 12/19/2023, 7:54 AM

## 2023-12-19 NOTE — Discharge Instructions (Signed)
   Wound Care Keep incision covered and dry until post op day 3. You may remove the Honeycomb dressing on post op day 3. Leave steri-strips on back.  They will fall off by themselves. Do not put any creams, lotions, or ointments on incision. You are fine to shower. Let water run over incision and pat dry.  Activity Walk each and every day, increasing distance each day. No lifting greater than 8 lbs.  Avoid excessive back motion. No driving, or riding a car unless coming back and forth to see the doctor  Diet Resume your normal diet.   Return to Work Will be discussed at your follow up appointment.  Call Your Doctor If Any of These Occur Redness, drainage, or swelling at the wound.  Temperature greater than 101 degrees. Severe pain not relieved by pain medication. Incision starts to come apart.  Follow Up Appt Call [phone] if you have one or any problem.

## 2023-12-20 ENCOUNTER — Ambulatory Visit: Admitting: Podiatry

## 2023-12-20 ENCOUNTER — Telehealth: Payer: Self-pay | Admitting: *Deleted

## 2023-12-20 DIAGNOSIS — M47817 Spondylosis without myelopathy or radiculopathy, lumbosacral region: Secondary | ICD-10-CM

## 2023-12-20 NOTE — Transitions of Care (Post Inpatient/ED Visit) (Signed)
 12/20/2023  Name: MUSAB WINGARD MRN: [id number] DOB: 01-24-1966  Today's TOC FU Call Status: Today's TOC FU Call Status:: Successful TOC FU Call Completed TOC FU Call Complete Date: 12/20/23 Patient's Name and Date of Birth confirmed.  Transition Care Management Follow-up Telephone Call Date of Discharge: 12/19/23 Discharge Facility: [facility]) Type of Discharge: Inpatient Admission Primary Inpatient Discharge Diagnosis:: planned surgical lumbar fusion with exploration of L 4-5: overnight admission How have you been since you were released from the hospital?: Better (I am doing fine here and just a little sore.  I don't want to review my medicines; I am resting.  The only thing I need right now is talk to someone about my bill for natural gas- I am afraid they might cut it off, everything else is okay) Any questions or concerns?: Yes Patient Questions/Concerns:: Concerned about Natural Gas bill- states they might cut off the gas soon Patient Questions/Concerns Addressed: Other: Nurse, learning disability Care Guide referral placed: unable to access FindHelp to provide resource during [facility] call)  Items Reviewed: Did you receive and understand the discharge instructions provided?: Yes (briefly reviewed with patient who verbalizes good understanding of same - patient declined detailed review) Medications obtained,verified, and reconciled?: No (Per patient: self-manages medications and denies questions/ concerns around medications today) Medications Not Reviewed Reasons:: Other: (Patient declined full medication review today: not near medication list: reports no questions or concerns around medications) Any new allergies since your discharge?: No Dietary orders reviewed?: Yes Type of Diet Ordered:: Regular Do you have support at home?: Yes People in Home [RPT]: significant other Name of Support/Comfort Primary Source: Reports independent in some self-care activities/ requires  assistance/ supervision with others; resides with supportive girlfriend who assists as/ if needed/ indicated  Medications Reviewed Today: Medications Reviewed Today     Reviewed by Areil Ottey M, RN (Registered Nurse) on 12/20/23 at 1104  Med List Status: <None>   Medication Order Taking? Sig Documenting Provider Last Dose Status Informant  AMBULATORY NON FORMULARY MEDICATION [id number]  Medication Name: Diltiazem 2% gel:Lidocaine  5% - Using your index finger, you should apply a small amount of medication inside the rectum up to your first knuckle/joint 3-4 times daily x 6-8 weeks.  Patient taking differently: Apply 1 Application topically daily as needed. Medication Name: Diltiazem 2% gel:Lidocaine  5% - Using your index finger, you should apply a small amount of medication inside the rectum up to your first knuckle/joint 3-4 times daily x 6-8 weeks.   Esterwood, Amy S, PA-C  Active Self  amLODipine  (NORVASC ) 10 MG tablet [id number] No TAKE 1 TABLET(10 MG) BY MOUTH DAILY Norleen Lynwood ORN, MD 12/18/2023  5:00 AM Active Self  Aromatic Inhalants (VICKS VAPOINHALER IN) [id number]  Inhale 1 puff into the lungs daily as needed (congestion). [provider]  Active Self  aspirin  EC 81 MG tablet [id number] No Take 81 mg by mouth in the morning. [provider] 12/12/2023 Active Self  aspirin -sod bicarb-citric acid (ALKA-SELTZER) 325 MG TBEF tablet [id number]  Take 650 mg by mouth every 6 (six) hours as needed (indigestion). [provider]  Active Self  Blood Glucose Monitoring Suppl (ONE TOUCH ULTRA 2) w/Device KIT [id number]  Use as directed daily E11.9 Norleen Lynwood ORN, MD  Active Self  Cholecalciferol  (VITAMIN D3 PO) [id number] No Take 1 capsule by mouth daily. [provider] More than a month Active Self  citalopram  (CELEXA ) 40 MG tablet [id number] No TAKE 1 TABLET(40 MG) BY MOUTH DAILY Norleen Lynwood ORN, MD  12/18/2023  5:00 AM Active Self  Continuous Blood Gluc Receiver (DEXCOM G7  [place]) DEVI [id number]  Use as directed once per day E11.9 Norleen Lynwood ORN, MD  Active Self  Continuous Blood Gluc Sensor (DEXCOM G7 [place]) MISC [id number]  Use as directed once every 2 weeks E11.9 Norleen Lynwood ORN, MD  Active Self  diclofenac  Sodium (VOLTAREN ) 1 % GEL [id number]  Apply 4 g topically 4 (four) times daily as needed (Pain). [provider]  Active Self  doxycycline  (VIBRA -TABS) 100 MG tablet [id number] No Take 1 tablet (100 mg total) by mouth 2 (two) times daily. Take twice daily with food Sandridge, Brenda K, PA-C 12/18/2023  5:00 AM Active Self  fenofibrate  (TRICOR ) 145 MG tablet [id number] No TAKE 1 TABLET(145 MG) BY MOUTH DAILY Norleen Lynwood ORN, MD 12/18/2023  5:00 AM Active Self  fluticasone  (FLONASE ) 50 MCG/ACT nasal spray [id number] No Place 2 sprays into both nostrils daily.  Patient taking differently: Place 2 sprays into both nostrils daily as needed for allergies.   Norleen Lynwood ORN, MD More than a month Active Self  furosemide  (LASIX ) 40 MG tablet [id number] No Take 40 mg by mouth in the morning. [provider] More than a month Active Self  gabapentin  (NEURONTIN ) 300 MG capsule [id number] No Take 900 mg by mouth 3 (three) times daily. [provider] 12/18/2023  5:00 AM Active Self  Garlic (GARLIQUE PO) [id number] No Take 1 capsule by mouth in the morning. [provider] Past Week Active Self  glucose blood test strip [id number]  Use as instructed once daily E119 Norleen Lynwood ORN, MD  Active Self  hydrochlorothiazide  (MICROZIDE ) 12.5 MG capsule [id number] No Take 1 capsule (12.5 mg total) by mouth daily.  Patient taking differently: Take 12.5 mg by mouth daily as needed (Edema).   Norleen Lynwood ORN, MD More than a month Expired 12/14/23 2359 Self, Pharmacy Records  hydroquinone  4 % cream [id number]  Apply to affected areas nightly for 3 months - then discontinue. Sandridge, Brenda K, PA-C  Active Self  ibuprofen  (ADVIL ) 800 MG tablet [id number] No Take 800 mg by  mouth 3 (three) times daily. [provider] Past Week Active Self  iron  polysaccharides (NU-IRON ) 150 MG capsule [id number] No Take 1 capsule (150 mg total) by mouth daily. Norleen Lynwood ORN, MD More than a month Active Self  Lancets MISC [id number]  Use as directed daily E11.9 Norleen Lynwood ORN, MD  Active Self  lisinopril  (ZESTRIL ) 40 MG tablet [id number] No TAKE 1 TABLET(40 MG) BY MOUTH DAILY Norleen Lynwood ORN, MD 12/18/2023  5:00 AM Active Self  loratadine  (CLARITIN ) 10 MG tablet [id number]  TAKE 1 TABLET(10 MG) BY MOUTH DAILY AS NEEDED FOR ALLERGIES  Patient taking differently: Take 10 mg by mouth daily.   Norleen Lynwood ORN, MD  Active Self  Magnesium  200 MG TABS [id number] No Take 400 mg by mouth in the morning. [provider] Past Week Active Self  Misc Natural Products (BEET ROOT PO) [id number] No Take 1 tablet by mouth in the morning and at bedtime. Total Beets [provider] Past Week Active Self  omeprazole  (PRILOSEC) 40 MG capsule [id number] No Take 1 capsule (40 mg total) by mouth daily. Esterwood, Amy S, PA-C 12/18/2023  5:00 AM Active Self  oxyCODONE -acetaminophen  (PERCOCET/ROXICET) 5-325 MG tablet [id number]  Take 1-2 tablets by mouth every 4 (four) hours as needed for moderate pain (pain score 4-6) or severe pain (pain score 7-10). [name], MD  Active   PERCOCET  10-325 MG tablet [id number] No Take 1 tablet by mouth every 6 (six) hours. [provider] 12/18/2023  5:00 AM Active Self  Phenylephrine -Acetaminophen  (TYLENOL  SINUS CONGESTION/PAIN PO) [id number] No Take 2 tablets by mouth 3 (three) times daily as needed (congestion). [provider] Past Week Active Self  Polyethyl Glycol-Propyl Glycol (LUBRICANT EYE DROPS) 0.4-0.3 % SOLN [id number]  Place 1-2 drops into both eyes 3 (three) times daily as needed (dry/irritated eyes.). [provider]  Active Self  Potassium Chloride  ER 20 MEQ TBCR [id number] No TAKE 1 TABLET BY MOUTH DAILY Norleen Lynwood ORN, MD Past  Month Active Self  rosuvastatin  (CRESTOR ) 20 MG tablet [id number] No TAKE 1 TABLET(20 MG) BY MOUTH DAILY Norleen Lynwood ORN, MD 12/18/2023  5:00 AM Active Self  tadalafil  (CIALIS ) 20 MG tablet [id number]  Take 0.5-1 tablets (10-20 mg total) by mouth every other day as needed for erectile dysfunction. Norleen Lynwood ORN, MD  Active Self  tamsulosin  (FLOMAX ) 0.4 MG CAPS capsule [id number] No TAKE 1 CAPSULE(0.4 MG) BY MOUTH DAILY Norleen Lynwood ORN, MD 12/17/2023 Active Self  tirzepatide  (MOUNJARO ) 12.5 MG/0.5ML Pen [id number] No Inject 12.5 mg into the skin once a week.  Patient taking differently: Inject 12.5 mg into the skin every Friday.   Norleen Lynwood ORN, MD 12/08/2023 Active Self  tiZANidine  (ZANAFLEX ) 4 MG tablet [id number] No Take 4 mg by mouth 3 (three) times daily as needed for muscle spasms. [provider] Past Week Active Self  tolterodine  (DETROL  LA) 4 MG 24 hr capsule [id number] No Take 1 capsule (4 mg total) by mouth daily.  Patient taking differently: Take 4 mg by mouth daily as needed (Urgency).   Norleen Lynwood ORN, MD 12/18/2023  5:00 AM Active Self  varenicline  (CHANTIX  CONTINUING MONTH PAK) 1 MG tablet [id number] No Take 1 tablet (1 mg total) by mouth 2 (two) times daily. Norleen Lynwood ORN, MD 12/17/2023 Active Self           Home Care and Equipment/Supplies: Were Home Health Services Ordered?: No Any new equipment or medical supplies ordered?: No  Functional Questionnaire: Do you need assistance with bathing/showering or dressing?: Yes (significant other supervises/ assists as indicated) Do you need assistance with meal preparation?: Yes (significant other supervises/ assists as indicated) Do you need assistance with eating?: No Do you have difficulty maintaining continence: Yes (significant other supervises/ assists as indicated) Do you need assistance with getting out of bed/getting out of a chair/moving?: Yes (significant other supervises/ assists as indicated) Do you have difficulty managing  or taking your medications?: No (significant other supervises/ assists as indicated)  Follow up appointments reviewed: PCP Follow-up appointment confirmed?: Yes (care coordination outreach in real-time with scheduling care guide to successfully schedule hospital follow up PCP appointment 12/21/23) Date of PCP follow-up appointment?: 12/21/23 Follow-up Provider: PCP- Dr. Norleen Specialist [facility] Follow-up appointment confirmed?: Yes Date of Specialist follow-up appointment?: 01/02/24 Follow-Up Specialty Provider:: neurosurgery provider- Dr. Onetha Do you need transportation to your follow-up appointment?: No Do you understand care options if your condition(s) worsen?: Yes-patient verbalized understanding  SDOH Interventions Today    Flowsheet Row Most Recent Value  SDOH Interventions   Food Insecurity Interventions Intervention Not Indicated  [Denies food insecurity during TOC call today]  Housing Interventions Intervention Not Indicated  [Denies housing concerns during [facility] call today]  Transportation Interventions Intervention Not Indicated  [Reports significant other provides transportation as per baseline]  Utilities Interventions Other (Comment)  [Unable to access FindHelp to provide resources during [facility] call: Referral  placed to [facility] Guide]   See TOC assessment tabs for additional assessment/ TOC intervention information  Patient declines need for ongoing/ further care management/ coordination outreach; declines enrollment in 30-day TOC program- declines taking my direct phone number should needs/ concerns arise post-TOC call   Pls call/ message for questions,  Kaniah Rizzolo Mckinney Emslee Lopezmartinez, RN, BSN, CCRN Alumnus RN Care Manager  Transitions of Care  [facility] - [facility] [phone]: direct office

## 2023-12-21 ENCOUNTER — Encounter: Payer: Self-pay | Admitting: Internal Medicine

## 2023-12-21 ENCOUNTER — Telehealth: Payer: Self-pay | Admitting: *Deleted

## 2023-12-21 ENCOUNTER — Ambulatory Visit (INDEPENDENT_AMBULATORY_CARE_PROVIDER_SITE_OTHER): Admitting: Internal Medicine

## 2023-12-21 VITALS — BP 132/86 | HR 53 | Temp 98.2°F | Ht 71.0 in | Wt 241.0 lb

## 2023-12-21 DIAGNOSIS — E785 Hyperlipidemia, unspecified: Secondary | ICD-10-CM

## 2023-12-21 DIAGNOSIS — E1140 Type 2 diabetes mellitus with diabetic neuropathy, unspecified: Secondary | ICD-10-CM | POA: Diagnosis not present

## 2023-12-21 DIAGNOSIS — Z7985 Long-term (current) use of injectable non-insulin antidiabetic drugs: Secondary | ICD-10-CM | POA: Diagnosis not present

## 2023-12-21 DIAGNOSIS — E559 Vitamin D deficiency, unspecified: Secondary | ICD-10-CM | POA: Diagnosis not present

## 2023-12-21 DIAGNOSIS — I10 Essential (primary) hypertension: Secondary | ICD-10-CM | POA: Diagnosis not present

## 2023-12-21 DIAGNOSIS — K219 Gastro-esophageal reflux disease without esophagitis: Secondary | ICD-10-CM | POA: Diagnosis not present

## 2023-12-21 MED ORDER — PANTOPRAZOLE SODIUM 40 MG PO TBEC: 40.0000 mg | DELAYED_RELEASE_TABLET | Freq: Every day | ORAL | 3 refills | Status: AC

## 2023-12-21 NOTE — Progress Notes (Addendum)
 Patient ID: Harold [name], male   DOB: 10-05-1965, 58 y.o.   MRN: [id number]        Chief Complaint: follow up recent hospn for pseudoarthritis lumbar spine s/p fusion sept 22 - 23 2025 per Dr Onetha NS, DM, hld, htn, low vit d       HPI:  Harold [name] is a 58 y.o. male here overall a bit stiff and walking slowly with lumbar support about the waist, but overall doing quite nicely.  Pt denies chest pain, increased sob or doe, wheezing, orthopnea, PND, increased LE swelling, palpitations, dizziness or syncope.   Pt denies polydipsia, polyuria, or new focal neuro s/s.    Pt denies fever, night sweats, loss of appetite, or other constitutional symptoms, except for wt loss several lbs assoc with mounajro.  Pt does not require PT it seems  Only complaint today is worsening recent reflux, but Denies worsening abd pain, dysphagia, n/v, bowel change or blood.       Wt Readings from Last 3 Encounters:  12/21/23 241 lb (109.3 kg)  12/18/23 (P) 236 lb 12.8 oz (107.4 kg)  12/15/23 236 lb 12.8 oz (107.4 kg)   BP Readings from Last 3 Encounters:  12/21/23 132/86  12/19/23 (!) 142/79  12/15/23 (!) 146/78         Past Medical History:  Diagnosis Date   Acute idiopathic pericarditis 04/08/2010   Qualifier: Diagnosis of  By: Lelon RIGGERS, [name]     Acute sinusitis 06/04/2014   Anxiety    CHF (congestive heart failure) (HCC) 06/29/2010   was pericardititis not CHF   Chronic back pain    Constipation    DDD (degenerative disc disease), lumbar 09/14/2012   Depression    pt denies   Erectile dysfunction 09/14/2012   GERD (gastroesophageal reflux disease)    ocassional   Gout, unspecified 05/01/2007   Qualifier: Diagnosis of  By: Krystal MD, Reyes A    Headache    migraines in the past   History of pancreatitis    a. admx 04-2009.SABRASABRA? 2-2 triglycerides   Hypertension    Hypertriglyceridemia    a. followed by [facility]   Left sided sciatica 09/14/2012   Neuropathy    Obesity    OSA  (obstructive sleep apnea)    CPAP- not current   Restless legs    Type 2 diabetes mellitus with diabetic neuropathy, unspecified (HCC) 09/20/2019   Past Surgical History:  Procedure Laterality Date   ANAL INTRAEPITHELIAL NEOPLASIA EXCISION Left 02/15/2023   Procedure: EXCISION SEBACEOUS CYST LEFT AXILLA;  Surgeon: Curvin Deward MOULD, MD;  Location: [facility];  Service: General;  Laterality: Left;   ANTERIOR CERVICAL DECOMP/DISCECTOMY FUSION N/A 09/14/2020   Procedure: Anterior Cervical Decompression Fusion - Cervical three-Cervical four;  Surgeon: [name], MD;  Location: [facility] OR;  Service: Neurosurgery;  Laterality: N/A;   BACK SURGERY  2021   lumbar fusion   COLONOSCOPY     ELBOW SURGERY Left    KNEE SURGERY Right    x3 scopes   KNEE SURGERY Right    open menisicus repair   LAMINECTOMY WITH POSTERIOR LATERAL ARTHRODESIS LEVEL 1 N/A 12/18/2023   Procedure: Lumbar Four-Five with Exploration of Fusion with Removal of Hardware Redo Posterolateral Arthrodesis Non Instrumented;  Surgeon: [name], MD;  Location: [facility] OR;  Service: Neurosurgery;  Laterality: N/A;  Lumbar Four-Five with Exploration of Fusion with Removal of Hardware Redo Posterolateral Arthrodesis Non Instrumented   LUMBAR LAMINECTOMY/DECOMPRESSION MICRODISCECTOMY  N/A 08/26/2015   Procedure: Lumbar Two-Sacral One  Laminectomy for decompression;  Surgeon: Morene Hicks Ditty, MD;  Location: [facility];  Service: Neurosurgery;  Laterality: N/A;   pericardectomy  07/15/2010   Dr. Army   pleurx catheter placement  07/15/2010   Dr. Army   SHOULDER SURGERY     right and left shoulders- Rotator cuff repair   SHOULDER SURGERY Right 2023   TONSILLECTOMY AND ADENOIDECTOMY     one tonsil    reports that he has been smoking cigarettes. He has a 18 pack-year smoking history. He quit smokeless tobacco use about 4 years ago.  His smokeless tobacco use included snuff. He reports that he does not currently use  alcohol . He reports that he does not use drugs. family history includes Colon cancer (age of onset: 44) in his father; Diabetes in his father, mother, and sister; Heart disease (age of onset: 103) in his father; Hypertension in an other family member; Liver cancer in his paternal uncle; Pancreatitis in his father and paternal uncle; Stomach cancer in his maternal grandfather. Allergies  Allergen Reactions   Beta Adrenergic Blockers Other (See Comments)    Erectile dysfxn   Diphenhydramine Palpitations and Other (See Comments)    jittery Pt states he can take Benadryl in an emergency situation   [facility] Itching and Swelling    swelling of tongue   Colchicine Diarrhea   Lipitor [Atorvastatin ] Other (See Comments)    Muscle cramps   Pecan Nut (Diagnostic) Palpitations    Pecans: itching and swelling of the tongue    Septra  [Sulfamethoxazole -Trimethoprim ] Itching   Current Outpatient Medications on File Prior to Visit  Medication Sig Dispense Refill   AMBULATORY NON FORMULARY MEDICATION Medication Name: Diltiazem 2% gel:Lidocaine  5% - Using your index finger, you should apply a small amount of medication inside the rectum up to your first knuckle/joint 3-4 times daily x 6-8 weeks. (Patient taking differently: Apply 1 Application topically daily as needed. Medication Name: Diltiazem 2% gel:Lidocaine  5% - Using your index finger, you should apply a small amount of medication inside the rectum up to your first knuckle/joint 3-4 times daily x 6-8 weeks.) 45 g 2   amLODipine  (NORVASC ) 10 MG tablet TAKE 1 TABLET(10 MG) BY MOUTH DAILY 90 tablet 3   Aromatic Inhalants (VICKS VAPOINHALER IN) Inhale 1 puff into the lungs daily as needed (congestion).     aspirin  EC 81 MG tablet Take 81 mg by mouth in the morning.     aspirin -sod bicarb-citric acid (ALKA-SELTZER) 325 MG TBEF tablet Take 650 mg by mouth every 6 (six) hours as needed (indigestion).     Blood Glucose Monitoring Suppl (ONE TOUCH ULTRA 2)  w/Device KIT Use as directed daily E11.9 1 each 0   Cholecalciferol  (VITAMIN D3 PO) Take 1 capsule by mouth daily.     citalopram  (CELEXA ) 40 MG tablet TAKE 1 TABLET(40 MG) BY MOUTH DAILY 90 tablet 3   Continuous Blood Gluc Receiver (DEXCOM G7 RECEIVER) DEVI Use as directed once per day E11.9 1 each 0   Continuous Blood Gluc Sensor (DEXCOM G7 SENSOR) MISC Use as directed once every 2 weeks E11.9 6 each 3   diclofenac  Sodium (VOLTAREN ) 1 % GEL Apply 4 g topically 4 (four) times daily as needed (Pain).     doxycycline  (VIBRA -TABS) 100 MG tablet Take 1 tablet (100 mg total) by mouth 2 (two) times daily. Take twice daily with food 60 tablet 0   fenofibrate  (TRICOR ) 145 MG tablet TAKE  1 TABLET(145 MG) BY MOUTH DAILY 90 tablet 3   fluticasone  (FLONASE ) 50 MCG/ACT nasal spray Place 2 sprays into both nostrils daily. (Patient taking differently: Place 2 sprays into both nostrils daily as needed for allergies.) 16 g 6   furosemide  (LASIX ) 40 MG tablet Take 40 mg by mouth in the morning.     gabapentin  (NEURONTIN ) 300 MG capsule Take 900 mg by mouth 3 (three) times daily.     Garlic (GARLIQUE PO) Take 1 capsule by mouth in the morning.     glucose blood test strip Use as instructed once daily E119 100 each 12   hydrochlorothiazide  (MICROZIDE ) 12.5 MG capsule Take 1 capsule (12.5 mg total) by mouth daily. (Patient taking differently: Take 12.5 mg by mouth daily as needed (Edema).) 90 capsule 3   hydroquinone  4 % cream Apply to affected areas nightly for 3 months - then discontinue. 28.35 g 1   ibuprofen  (ADVIL ) 800 MG tablet Take 800 mg by mouth 3 (three) times daily.     iron  polysaccharides (NU-IRON ) 150 MG capsule Take 1 capsule (150 mg total) by mouth daily. 90 capsule 1   Lancets MISC Use as directed daily E11.9 100 each 11   lisinopril  (ZESTRIL ) 40 MG tablet TAKE 1 TABLET(40 MG) BY MOUTH DAILY 90 tablet 3   loratadine  (CLARITIN ) 10 MG tablet TAKE 1 TABLET(10 MG) BY MOUTH DAILY AS NEEDED FOR ALLERGIES  (Patient taking differently: Take 10 mg by mouth daily.) 90 tablet 3   Magnesium  200 MG TABS Take 400 mg by mouth in the morning.     Misc Natural Products (BEET ROOT PO) Take 1 tablet by mouth in the morning and at bedtime. Total Beets     oxyCODONE -acetaminophen  (PERCOCET/ROXICET) 5-325 MG tablet Take 1-2 tablets by mouth every 4 (four) hours as needed for moderate pain (pain score 4-6) or severe pain (pain score 7-10). 90 tablet 0   PERCOCET 10-325 MG tablet Take 1 tablet by mouth every 6 (six) hours.     Phenylephrine -Acetaminophen  (TYLENOL  SINUS CONGESTION/PAIN PO) Take 2 tablets by mouth 3 (three) times daily as needed (congestion).     Polyethyl Glycol-Propyl Glycol (LUBRICANT EYE DROPS) 0.4-0.3 % SOLN Place 1-2 drops into both eyes 3 (three) times daily as needed (dry/irritated eyes.).     Potassium Chloride  ER 20 MEQ TBCR TAKE 1 TABLET BY MOUTH DAILY 90 tablet 3   rosuvastatin  (CRESTOR ) 20 MG tablet TAKE 1 TABLET(20 MG) BY MOUTH DAILY 90 tablet 3   tadalafil  (CIALIS ) 20 MG tablet Take 0.5-1 tablets (10-20 mg total) by mouth every other day as needed for erectile dysfunction. 10 tablet 11   tamsulosin  (FLOMAX ) 0.4 MG CAPS capsule TAKE 1 CAPSULE(0.4 MG) BY MOUTH DAILY 90 capsule 3   tirzepatide  (MOUNJARO ) 12.5 MG/0.5ML Pen Inject 12.5 mg into the skin once a week. (Patient taking differently: Inject 12.5 mg into the skin every Friday.) 6 mL 3   tiZANidine  (ZANAFLEX ) 4 MG tablet Take 4 mg by mouth 3 (three) times daily as needed for muscle spasms.     tolterodine  (DETROL  LA) 4 MG 24 hr capsule Take 1 capsule (4 mg total) by mouth daily. (Patient taking differently: Take 4 mg by mouth daily as needed (Urgency).) 90 capsule 3   varenicline  (CHANTIX  CONTINUING MONTH PAK) 1 MG tablet Take 1 tablet (1 mg total) by mouth 2 (two) times daily. 60 tablet 2   No current facility-administered medications on file prior to visit.  ROS:  All others reviewed and negative.  Objective        PE:  BP  132/86   Pulse (!) 53   Temp 98.2 F (36.8 C)   Ht 5' 11 (1.803 m)   Wt 241 lb (109.3 kg)   SpO2 99%   BMI 33.61 kg/m                 Constitutional: Pt appears in NAD               HENT: Head: NCAT.                Right Ear: External ear normal.                 Left Ear: External ear normal.                Eyes: . Pupils are equal, round, and reactive to light. Conjunctivae and EOM are normal               Nose: without d/c or deformity               Neck: Neck supple. Gross normal ROM               Cardiovascular: Normal rate and regular rhythm.                 Pulmonary/Chest: Effort normal and breath sounds without rales or wheezing.                Abd:  Soft, NT, ND, + BS, no organomegaly               Neurological: Pt is alert. At baseline orientation, motor grossly intact               Skin: Skin is warm. No rashes, no other new lesions, LE edema - none               Psychiatric: Pt behavior is normal without agitation   Micro: none  Cardiac tracings I have personally interpreted today:  none  Pertinent Radiological findings (summarize): none   Lab Results  Component Value Date   WBC 9.4 12/15/2023   HGB 13.9 12/15/2023   HCT 42.9 12/15/2023   PLT 408 (H) 12/15/2023   GLUCOSE 83 12/14/2023   CHOL 122 05/05/2023   TRIG 131.0 05/05/2023   HDL 33.80 (L) 05/05/2023   LDLDIRECT 124.0 04/05/2022   LDLCALC 62 05/05/2023   ALT 15 05/05/2023   AST 18 05/05/2023   NA 140 12/14/2023   K 4.2 12/14/2023   CL 102 12/14/2023   CREATININE 1.03 12/14/2023   BUN 10 12/14/2023   CO2 23 12/14/2023   TSH 0.47 05/05/2023   PSA 2.36 05/05/2023   INR 1.72 (H) 07/05/2010   HGBA1C 5.4 12/15/2023   MICROALBUR 2.6 (H) 05/05/2023   Assessment/Plan:  STYLIANOS STRADLING is a 58 y.o. Black or African American [2] male with  has a past medical history of Acute idiopathic pericarditis (04/08/2010), Acute sinusitis (06/04/2014), Anxiety, CHF (congestive heart failure) (HCC) (06/29/2010),  Chronic back pain, Constipation, DDD (degenerative disc disease), lumbar (09/14/2012), Depression, Erectile dysfunction (09/14/2012), GERD (gastroesophageal reflux disease), Gout, unspecified (05/01/2007), Headache, History of pancreatitis, Hypertension, Hypertriglyceridemia, Left sided sciatica (09/14/2012), Neuropathy, Obesity, OSA (obstructive sleep apnea), Restless legs, and Type 2 diabetes mellitus with diabetic neuropathy, unspecified (HCC) (09/20/2019).  Type 2 diabetes mellitus with diabetic neuropathy, unspecified (HCC) Lab Results  Component Value Date  HGBA1C 5.4 12/15/2023   Stable, pt to continue current medical treatment mounjaro  12.5 mg weekly   Hyperlipidemia Lab Results  Component Value Date   LDLCALC 62 05/05/2023   Stable, pt to continue current statin crestor  20 mg qd   Essential hypertension BP Readings from Last 3 Encounters:  12/21/23 132/86  12/19/23 (!) 142/79  12/15/23 (!) 146/78   Stable, pt to continue medical treatment norvasc  10 every day, hct 12.5 every day, lisiniopril 40 qd   Vitamin D  deficiency Last vitamin D  Lab Results  Component Value Date   VD25OH 19.05 (L) 05/05/2023   Low, to start oral replacement   GERD (gastroesophageal reflux disease) Mild uncontrolled without red flags, for change omeprazole  to protonix  40 mg qd Followup: Return in about 6 months (around 06/19/2024).  Lynwood Rush, MD 12/21/2023 7:00 PM [facility]

## 2023-12-21 NOTE — Patient Instructions (Signed)
 Please continue all other medications as before, including the change of omeprazole  to protonix   Please have the pharmacy call with any other refills you may need.  Please continue your efforts at being more active, low cholesterol diet, and weight control.  You are otherwise up to date with prevention measures today.  Please keep your appointments with your specialists as you may have planned- Dr Onetha Oct 7  Please make an Appointment to return in 6 months, or sooner if needed

## 2023-12-21 NOTE — Progress Notes (Signed)
 Complex Care Management Note Care Guide Note  12/21/2023 Name: Harold [name] MRN: [id number] DOB: January 30, 1966   Complex Care Management Outreach Attempts: An unsuccessful telephone outreach was attempted today to offer the patient information about available complex care management services.  Follow Up Plan:  Additional outreach attempts will be made to offer the patient complex care management information and services.   Encounter Outcome:  No Answer [facility] Guide  Direct Dial:[phone] [phone] Website: [email or web address]

## 2023-12-21 NOTE — Assessment & Plan Note (Signed)
 Lab Results  Component Value Date   HGBA1C 5.4 12/15/2023   Stable, pt to continue current medical treatment mounjaro  12.5 mg weekly

## 2023-12-21 NOTE — Assessment & Plan Note (Signed)
 Last vitamin D  Lab Results  Component Value Date   VD25OH 19.05 (L) 05/05/2023   Low, to start oral replacement

## 2023-12-21 NOTE — Addendum Note (Signed)
 Addended by: NORLEEN LYNWOOD ORN on: 12/21/2023 07:00 PM   Modules accepted: Level of Service

## 2023-12-21 NOTE — Assessment & Plan Note (Signed)
 Lab Results  Component Value Date   LDLCALC 62 05/05/2023   Stable, pt to continue current statin crestor  20 mg qd

## 2023-12-21 NOTE — Assessment & Plan Note (Signed)
 Mild uncontrolled without red flags, for change omeprazole  to protonix  40 mg qd

## 2023-12-21 NOTE — Assessment & Plan Note (Signed)
 BP Readings from Last 3 Encounters:  12/21/23 132/86  12/19/23 (!) 142/79  12/15/23 (!) 146/78   Stable, pt to continue medical treatment norvasc  10 every day, hct 12.5 every day, lisiniopril 40 qd

## 2023-12-22 ENCOUNTER — Telehealth: Payer: Self-pay

## 2023-12-22 NOTE — Progress Notes (Signed)
   Telephone encounter was:  Unsuccessful.  12/22/2023 Name: Harold [name] MRN: [id number] DOB: 1965-05-10  Unsuccessful outbound call made today to assist with:  Financial Difficulties related to financial strain  Outreach Attempt:  1st Attempt    Jon Colt [facility]  [facility] Guide, Phone: [phone] Fax: [phone] Website: [email or web address]

## 2023-12-25 DIAGNOSIS — M4800 Spinal stenosis, site unspecified: Secondary | ICD-10-CM | POA: Diagnosis not present

## 2023-12-25 DIAGNOSIS — F419 Anxiety disorder, unspecified: Secondary | ICD-10-CM | POA: Diagnosis not present

## 2023-12-25 DIAGNOSIS — G4733 Obstructive sleep apnea (adult) (pediatric): Secondary | ICD-10-CM | POA: Diagnosis not present

## 2023-12-25 DIAGNOSIS — E1122 Type 2 diabetes mellitus with diabetic chronic kidney disease: Secondary | ICD-10-CM | POA: Diagnosis not present

## 2023-12-25 DIAGNOSIS — G2581 Restless legs syndrome: Secondary | ICD-10-CM | POA: Diagnosis not present

## 2023-12-25 DIAGNOSIS — N1831 Chronic kidney disease, stage 3a: Secondary | ICD-10-CM | POA: Diagnosis not present

## 2023-12-25 DIAGNOSIS — F17210 Nicotine dependence, cigarettes, uncomplicated: Secondary | ICD-10-CM | POA: Diagnosis not present

## 2023-12-25 DIAGNOSIS — M109 Gout, unspecified: Secondary | ICD-10-CM | POA: Diagnosis not present

## 2023-12-25 DIAGNOSIS — E1142 Type 2 diabetes mellitus with diabetic polyneuropathy: Secondary | ICD-10-CM | POA: Diagnosis not present

## 2023-12-25 DIAGNOSIS — F324 Major depressive disorder, single episode, in partial remission: Secondary | ICD-10-CM | POA: Diagnosis not present

## 2023-12-25 DIAGNOSIS — M62838 Other muscle spasm: Secondary | ICD-10-CM | POA: Diagnosis not present

## 2023-12-25 DIAGNOSIS — N529 Male erectile dysfunction, unspecified: Secondary | ICD-10-CM | POA: Diagnosis not present

## 2023-12-25 DIAGNOSIS — Z7985 Long-term (current) use of injectable non-insulin antidiabetic drugs: Secondary | ICD-10-CM | POA: Diagnosis not present

## 2023-12-25 DIAGNOSIS — E876 Hypokalemia: Secondary | ICD-10-CM | POA: Diagnosis not present

## 2023-12-25 DIAGNOSIS — K219 Gastro-esophageal reflux disease without esophagitis: Secondary | ICD-10-CM | POA: Diagnosis not present

## 2023-12-25 DIAGNOSIS — Z6832 Body mass index (BMI) 32.0-32.9, adult: Secondary | ICD-10-CM | POA: Diagnosis not present

## 2023-12-25 DIAGNOSIS — F1120 Opioid dependence, uncomplicated: Secondary | ICD-10-CM | POA: Diagnosis not present

## 2023-12-25 DIAGNOSIS — I509 Heart failure, unspecified: Secondary | ICD-10-CM | POA: Diagnosis not present

## 2023-12-25 DIAGNOSIS — M1990 Unspecified osteoarthritis, unspecified site: Secondary | ICD-10-CM | POA: Diagnosis not present

## 2023-12-25 DIAGNOSIS — Z5941 Food insecurity: Secondary | ICD-10-CM | POA: Diagnosis not present

## 2023-12-25 DIAGNOSIS — N400 Enlarged prostate without lower urinary tract symptoms: Secondary | ICD-10-CM | POA: Diagnosis not present

## 2023-12-25 DIAGNOSIS — N3941 Urge incontinence: Secondary | ICD-10-CM | POA: Diagnosis not present

## 2023-12-25 DIAGNOSIS — M5440 Lumbago with sciatica, unspecified side: Secondary | ICD-10-CM | POA: Diagnosis not present

## 2023-12-25 DIAGNOSIS — E785 Hyperlipidemia, unspecified: Secondary | ICD-10-CM | POA: Diagnosis not present

## 2023-12-25 DIAGNOSIS — I130 Hypertensive heart and chronic kidney disease with heart failure and stage 1 through stage 4 chronic kidney disease, or unspecified chronic kidney disease: Secondary | ICD-10-CM | POA: Diagnosis not present

## 2023-12-25 DIAGNOSIS — Z5948 Other specified lack of adequate food: Secondary | ICD-10-CM | POA: Diagnosis not present

## 2023-12-25 DIAGNOSIS — J309 Allergic rhinitis, unspecified: Secondary | ICD-10-CM | POA: Diagnosis not present

## 2023-12-26 ENCOUNTER — Telehealth: Payer: Self-pay

## 2023-12-26 NOTE — Progress Notes (Signed)
   Telephone encounter was:  Unsuccessful.  12/26/2023 Name: Harold [name] MRN: [id number] DOB: 04-29-1965  Unsuccessful outbound call made today to assist with:  Financial Difficulties related to financial strain  Outreach Attempt:  2nd Attempt  A HIPAA compliant voice message was left requesting a return call.  Instructed patient to call back   Jon Colt [facility] Guide, Phone: [phone] Fax: [phone] Website: [email or web address]

## 2023-12-28 ENCOUNTER — Telehealth: Payer: Self-pay

## 2023-12-28 NOTE — Progress Notes (Signed)
   Telephone encounter was:  Unsuccessful.  12/28/2023 Name: RAYNARD MAPPS MRN: [id number] DOB: 09-16-1965  Unsuccessful outbound call made today to assist with:  Financial Difficulties related to financial strain   Outreach Attempt:  3rd Attempt.  Referral closed unable to contact patient.  A HIPAA compliant voice message was left requesting a return call.  Instructed patient to call back    Jon Colt [facility] Guide, Phone: [phone] Fax: [phone] Website: [email or web address]

## 2024-01-02 DIAGNOSIS — S32009K Unspecified fracture of unspecified lumbar vertebra, subsequent encounter for fracture with nonunion: Secondary | ICD-10-CM | POA: Diagnosis not present

## 2024-01-02 DIAGNOSIS — M48062 Spinal stenosis, lumbar region with neurogenic claudication: Secondary | ICD-10-CM | POA: Diagnosis not present

## 2024-01-10 DIAGNOSIS — S32009D Unspecified fracture of unspecified lumbar vertebra, subsequent encounter for fracture with routine healing: Secondary | ICD-10-CM | POA: Diagnosis not present

## 2024-01-10 DIAGNOSIS — Z981 Arthrodesis status: Secondary | ICD-10-CM | POA: Diagnosis not present

## 2024-01-10 DIAGNOSIS — N400 Enlarged prostate without lower urinary tract symptoms: Secondary | ICD-10-CM | POA: Diagnosis not present

## 2024-01-10 DIAGNOSIS — M25552 Pain in left hip: Secondary | ICD-10-CM | POA: Diagnosis not present

## 2024-01-15 ENCOUNTER — Telehealth: Payer: Self-pay | Admitting: Cardiology

## 2024-01-15 ENCOUNTER — Ambulatory Visit

## 2024-01-15 NOTE — Telephone Encounter (Signed)
 Spoke with pt and he said he was not aware he had an appt today until just a little bit ago. Pt has been rescheduled for 10/28 at 3PM.

## 2024-01-15 NOTE — Telephone Encounter (Signed)
 Pt calling to reschedule Nurse Visit appt for today at 3PM. Please advise.

## 2024-01-23 ENCOUNTER — Ambulatory Visit

## 2024-01-23 ENCOUNTER — Telehealth: Payer: Self-pay | Admitting: Cardiology

## 2024-01-23 NOTE — Telephone Encounter (Signed)
 Pt called to cancel Nurse Visit appt on today since he's in a lot of pain since a surgery he had done. This appt would need to be r/s. Please advise

## 2024-01-23 NOTE — Telephone Encounter (Signed)
 Attempted to call pt, unable to reach. LMTCB to r/s nurse visit for EKG check.

## 2024-02-07 DIAGNOSIS — M25511 Pain in right shoulder: Secondary | ICD-10-CM | POA: Diagnosis not present

## 2024-02-07 DIAGNOSIS — M1711 Unilateral primary osteoarthritis, right knee: Secondary | ICD-10-CM | POA: Diagnosis not present

## 2024-02-09 ENCOUNTER — Encounter: Payer: Self-pay | Admitting: Internal Medicine

## 2024-02-09 ENCOUNTER — Ambulatory Visit: Admitting: Internal Medicine

## 2024-02-09 ENCOUNTER — Other Ambulatory Visit: Payer: Self-pay | Admitting: Physician Assistant

## 2024-02-09 VITALS — BP 122/80 | HR 55 | Temp 98.3°F | Ht 71.0 in | Wt 242.0 lb

## 2024-02-09 DIAGNOSIS — Z7985 Long-term (current) use of injectable non-insulin antidiabetic drugs: Secondary | ICD-10-CM

## 2024-02-09 DIAGNOSIS — R2689 Other abnormalities of gait and mobility: Secondary | ICD-10-CM | POA: Diagnosis not present

## 2024-02-09 DIAGNOSIS — T7840XA Allergy, unspecified, initial encounter: Secondary | ICD-10-CM

## 2024-02-09 DIAGNOSIS — E1140 Type 2 diabetes mellitus with diabetic neuropathy, unspecified: Secondary | ICD-10-CM

## 2024-02-09 DIAGNOSIS — J329 Chronic sinusitis, unspecified: Secondary | ICD-10-CM | POA: Diagnosis not present

## 2024-02-09 DIAGNOSIS — J302 Other seasonal allergic rhinitis: Secondary | ICD-10-CM

## 2024-02-09 MED ORDER — PROMETHAZINE-DM 6.25-15 MG/5ML PO SYRP: 5.0000 mL | ORAL_SOLUTION | Freq: Four times a day (QID) | ORAL | 0 refills | Status: AC | PRN

## 2024-02-09 MED ORDER — AZITHROMYCIN 250 MG PO TABS: ORAL_TABLET | ORAL | 1 refills | Status: AC

## 2024-02-09 MED ORDER — METHYLPREDNISOLONE ACETATE 80 MG/ML IJ SUSP
80.0000 mg | Freq: Once | INTRAMUSCULAR | Status: AC
  Administered 2024-02-09: 80 mg via INTRAMUSCULAR

## 2024-02-09 NOTE — Patient Instructions (Signed)
 Please take all new medication as prescribed - the antibiotic and cough medicine  You had the steroid shot today  Please continue all other medications as before, and no other refills are needed at this time  Please have the pharmacy call with any other refills you may need..  Please keep your appointments with your specialists as you may have planned  You are given the script for the motorized scooter out of the home  Please make an Appointment to return in March 2026, or sooner if needed

## 2024-02-09 NOTE — Progress Notes (Signed)
 Patient ID: Harold [name], male   DOB: 02-16-1966, 58 y.o.   MRN: [id number]        Chief Complaint: follow up sinusitis, allergies, dm, multifactorial gait d/o       HPI:  Harold [name] is a 58 y.o. male  Here with 2-3 days acute onset fever, facial pain, pressure, headache, general weakness and malaise, and greenish d/c, with mild ST and cough, but pt denies chest pain, wheezing, increased sob or doe, orthopnea, PND, increased LE swelling, palpitations, dizziness or syncope.  Does have several wks ongoing nasal allergy symptoms with clearish congestion, itch and sneezing, without fever, pain, ST, cough, swelling or wheezing.  Conts with lower wt on mounjaro  12.5 mg weekly, no wt loss recently but stable.   Pt denies polydipsia, polyuria, or new focal neuro s/s.   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  He is hoping for a travel type scooter.    Still smoking, not ready to quit Wt Readings from Last 3 Encounters:  02/09/24 242 lb (109.8 kg)  12/21/23 241 lb (109.3 kg)  12/18/23 (P) 236 lb 12.8 oz (107.4 kg)   BP Readings from Last 3 Encounters:  02/09/24 122/80  12/21/23 132/86  12/19/23 (!) 142/79         Past Medical History:  Diagnosis Date   Acute idiopathic pericarditis 04/08/2010   Qualifier: Diagnosis of  By: Lelon RIGGERS, [name]     Acute sinusitis 06/04/2014   Anxiety    CHF (congestive heart failure) (HCC) 06/29/2010   was pericardititis not CHF   Chronic back pain    Constipation    DDD (degenerative disc disease), lumbar 09/14/2012   Depression    pt denies   Erectile dysfunction 09/14/2012   GERD (gastroesophageal reflux disease)    ocassional   Gout, unspecified 05/01/2007   Qualifier: Diagnosis of  By: Krystal MD, Reyes A    Headache    migraines in the past   History of pancreatitis    a. admx 04-2009.SABRASABRA? 2-2 triglycerides   Hypertension     Hypertriglyceridemia    a. followed by [facility]   Left sided sciatica 09/14/2012   Neuropathy    Obesity    OSA (obstructive sleep apnea)    CPAP- not current   Restless legs    Type 2 diabetes mellitus with diabetic neuropathy, unspecified (HCC) 09/20/2019   Past Surgical History:  Procedure Laterality Date   ANAL INTRAEPITHELIAL NEOPLASIA EXCISION Left 02/15/2023   Procedure: EXCISION SEBACEOUS CYST LEFT AXILLA;  Surgeon: Curvin Deward MOULD, MD;  Location: [facility];  Service: General;  Laterality: Left;   ANTERIOR CERVICAL DECOMP/DISCECTOMY FUSION N/A 09/14/2020   Procedure: Anterior Cervical Decompression Fusion - Cervical three-Cervical four;  Surgeon: [name], MD;  Location: [facility];  Service: Neurosurgery;  Laterality: N/A;   BACK SURGERY  2021   lumbar fusion   COLONOSCOPY     ELBOW SURGERY Left    KNEE SURGERY Right    x3 scopes   KNEE SURGERY Right    open menisicus repair   LAMINECTOMY WITH POSTERIOR LATERAL ARTHRODESIS LEVEL 1 N/A 12/18/2023   Procedure: Lumbar Four-Five with Exploration of Fusion with Removal of Hardware Redo Posterolateral Arthrodesis Non Instrumented;  Surgeon: [name], MD;  Location: [facility] OR;  Service: Neurosurgery;  Laterality: N/A;  Lumbar Four-Five with Exploration  of Fusion with Removal of Hardware Redo Posterolateral Arthrodesis Non Instrumented   LUMBAR LAMINECTOMY/DECOMPRESSION MICRODISCECTOMY N/A 08/26/2015   Procedure: Lumbar Two-Sacral One  Laminectomy for decompression;  Surgeon: Morene Hicks Ditty, MD;  Location: [facility];  Service: Neurosurgery;  Laterality: N/A;   pericardectomy  07/15/2010   Dr. Army   pleurx catheter placement  07/15/2010   Dr. Army   SHOULDER SURGERY     right and left shoulders- Rotator cuff repair   SHOULDER SURGERY Right 2023   TONSILLECTOMY AND ADENOIDECTOMY     one tonsil    reports that he has been smoking cigarettes. He has a 18 pack-year smoking history. He quit smokeless  tobacco use about 4 years ago.  His smokeless tobacco use included snuff. He reports that he does not currently use alcohol . He reports that he does not use drugs. family history includes Colon cancer (age of onset: 25) in his father; Diabetes in his father, mother, and sister; Heart disease (age of onset: 36) in his father; Hypertension in an other family member; Liver cancer in his paternal uncle; Pancreatitis in his father and paternal uncle; Stomach cancer in his maternal grandfather. Allergies  Allergen Reactions   Beta Adrenergic Blockers Other (See Comments)    Erectile dysfxn   Diphenhydramine Palpitations and Other (See Comments)    jittery Pt states he can take Benadryl in an emergency situation   [facility] Itching and Swelling    swelling of tongue   Colchicine Diarrhea   Lipitor [Atorvastatin ] Other (See Comments)    Muscle cramps   Pecan Nut (Diagnostic) Palpitations    Pecans: itching and swelling of the tongue    Septra  [Sulfamethoxazole -Trimethoprim ] Itching   Current Outpatient Medications on File Prior to Visit  Medication Sig Dispense Refill   AMBULATORY NON FORMULARY MEDICATION Medication Name: Diltiazem 2% gel:Lidocaine  5% - Using your index finger, you should apply a small amount of medication inside the rectum up to your first knuckle/joint 3-4 times daily x 6-8 weeks. (Patient taking differently: Apply 1 Application topically daily as needed. Medication Name: Diltiazem 2% gel:Lidocaine  5% - Using your index finger, you should apply a small amount of medication inside the rectum up to your first knuckle/joint 3-4 times daily x 6-8 weeks.) 45 g 2   amLODipine  (NORVASC ) 10 MG tablet TAKE 1 TABLET(10 MG) BY MOUTH DAILY 90 tablet 3   Aromatic Inhalants (VICKS VAPOINHALER IN) Inhale 1 puff into the lungs daily as needed (congestion).     aspirin  EC 81 MG tablet Take 81 mg by mouth in the morning.     aspirin -sod bicarb-citric acid (ALKA-SELTZER) 325 MG TBEF tablet Take 650 mg  by mouth every 6 (six) hours as needed (indigestion).     Blood Glucose Monitoring Suppl (ONE TOUCH ULTRA 2) w/Device KIT Use as directed daily E11.9 1 each 0   Cholecalciferol  (VITAMIN D3 PO) Take 1 capsule by mouth daily.     citalopram  (CELEXA ) 40 MG tablet TAKE 1 TABLET(40 MG) BY MOUTH DAILY 90 tablet 3   Continuous Blood Gluc Receiver (DEXCOM G7 RECEIVER) DEVI Use as directed once per day E11.9 1 each 0   Continuous Blood Gluc Sensor (DEXCOM G7 SENSOR) MISC Use as directed once every 2 weeks E11.9 6 each 3   cyclobenzaprine  (FLEXERIL ) 10 MG tablet Take by mouth.     diclofenac  Sodium (VOLTAREN ) 1 % GEL Apply 4 g topically 4 (four) times daily as needed (Pain).     doxycycline  (VIBRA -TABS) 100 MG tablet  Take 1 tablet (100 mg total) by mouth 2 (two) times daily. Take twice daily with food 60 tablet 0   fenofibrate  (TRICOR ) 145 MG tablet TAKE 1 TABLET(145 MG) BY MOUTH DAILY 90 tablet 3   fluticasone  (FLONASE ) 50 MCG/ACT nasal spray Place 2 sprays into both nostrils daily. (Patient taking differently: Place 2 sprays into both nostrils daily as needed for allergies.) 16 g 6   furosemide  (LASIX ) 40 MG tablet Take 40 mg by mouth in the morning.     gabapentin  (NEURONTIN ) 300 MG capsule Take 900 mg by mouth 3 (three) times daily.     Garlic (GARLIQUE PO) Take 1 capsule by mouth in the morning.     glucose blood test strip Use as instructed once daily E119 100 each 12   hydroquinone  4 % cream Apply to affected areas nightly for 3 months - then discontinue. 28.35 g 1   ibuprofen  (ADVIL ) 800 MG tablet Take 800 mg by mouth 3 (three) times daily.     iron  polysaccharides (NU-IRON ) 150 MG capsule Take 1 capsule (150 mg total) by mouth daily. 90 capsule 1   Lancets MISC Use as directed daily E11.9 100 each 11   lisinopril  (ZESTRIL ) 40 MG tablet TAKE 1 TABLET(40 MG) BY MOUTH DAILY 90 tablet 3   loratadine  (CLARITIN ) 10 MG tablet TAKE 1 TABLET(10 MG) BY MOUTH DAILY AS NEEDED FOR ALLERGIES (Patient taking  differently: Take 10 mg by mouth daily.) 90 tablet 3   Magnesium  200 MG TABS Take 400 mg by mouth in the morning.     Misc Natural Products (BEET ROOT PO) Take 1 tablet by mouth in the morning and at bedtime. Total Beets     Na Sulfate-K Sulfate-Mg Sulfate concentrate (SUPREP) 17.5-3.13-1.6 GM/177ML SOLN Take 1 kit by mouth once.     omeprazole  (PRILOSEC) 40 MG capsule Take 40 mg by mouth daily.     oxyCODONE -acetaminophen  (PERCOCET/ROXICET) 5-325 MG tablet Take 1-2 tablets by mouth every 4 (four) hours as needed for moderate pain (pain score 4-6) or severe pain (pain score 7-10). 90 tablet 0   pantoprazole  (PROTONIX ) 40 MG tablet Take 1 tablet (40 mg total) by mouth daily. 90 tablet 3   PERCOCET 10-325 MG tablet Take 1 tablet by mouth every 6 (six) hours.     Phenylephrine -Acetaminophen  (TYLENOL  SINUS CONGESTION/PAIN PO) Take 2 tablets by mouth 3 (three) times daily as needed (congestion).     Polyethyl Glycol-Propyl Glycol (LUBRICANT EYE DROPS) 0.4-0.3 % SOLN Place 1-2 drops into both eyes 3 (three) times daily as needed (dry/irritated eyes.).     Potassium Chloride  ER 20 MEQ TBCR TAKE 1 TABLET BY MOUTH DAILY 90 tablet 3   rosuvastatin  (CRESTOR ) 20 MG tablet TAKE 1 TABLET(20 MG) BY MOUTH DAILY 90 tablet 3   tadalafil  (CIALIS ) 20 MG tablet Take 0.5-1 tablets (10-20 mg total) by mouth every other day as needed for erectile dysfunction. 10 tablet 11   tamsulosin  (FLOMAX ) 0.4 MG CAPS capsule TAKE 1 CAPSULE(0.4 MG) BY MOUTH DAILY 90 capsule 3   tirzepatide  (MOUNJARO ) 12.5 MG/0.5ML Pen Inject 12.5 mg into the skin once a week. (Patient taking differently: Inject 12.5 mg into the skin every Friday.) 6 mL 3   tiZANidine  (ZANAFLEX ) 4 MG tablet Take 4 mg by mouth 3 (three) times daily as needed for muscle spasms.     tolterodine  (DETROL  LA) 4 MG 24 hr capsule Take 1 capsule (4 mg total) by mouth daily. (Patient taking differently: Take 4 mg by mouth  daily as needed (Urgency).) 90 capsule 3   varenicline   (CHANTIX  CONTINUING MONTH PAK) 1 MG tablet Take 1 tablet (1 mg total) by mouth 2 (two) times daily. 60 tablet 2   hydrochlorothiazide  (MICROZIDE ) 12.5 MG capsule Take 1 capsule (12.5 mg total) by mouth daily. (Patient taking differently: Take 12.5 mg by mouth daily as needed (Edema).) 90 capsule 3   No current facility-administered medications on file prior to visit.        ROS:  All others reviewed and negative.  Objective        PE:  BP 122/80 (BP Location: Right Arm, Patient Position: Sitting, Cuff Size: Normal)   Pulse (!) 55   Temp 98.3 F (36.8 C) (Oral)   Ht 5' 11 (1.803 m)   Wt 242 lb (109.8 kg)   SpO2 98%   BMI 33.75 kg/m                 Constitutional: Pt appears in NAD               HENT: Head: NCAT.                Right Ear: External ear normal.                 Left Ear: External ear normal. Bilat tm's with mild erythema.  Max sinus areas mild tender.  Pharynx with mild erythema, no exudate               Eyes: . Pupils are equal, round, and reactive to light. Conjunctivae and EOM are normal               Nose: without d/c or deformity               Neck: Neck supple. Gross normal ROM               Cardiovascular: Normal rate and regular rhythm.                 Pulmonary/Chest: Effort normal and breath sounds without rales or wheezing.                Abd:  Soft, NT, ND, + BS, no organomegaly               Neurological: Pt is alert. At baseline orientation, motor grossly intact               Skin: Skin is warm. No rashes, no other new lesions, LE edema - mnone               Psychiatric: Pt behavior is normal without agitation   Micro: none  Cardiac tracings I have personally interpreted today:  none  Pertinent Radiological findings (summarize): none   Lab Results  Component Value Date   WBC 9.4 12/15/2023   HGB 13.9 12/15/2023   HCT 42.9 12/15/2023   PLT 408 (H) 12/15/2023   GLUCOSE 83 12/14/2023   CHOL 122 05/05/2023   TRIG 131.0 05/05/2023   HDL 33.80 (L)  05/05/2023   LDLDIRECT 124.0 04/05/2022   LDLCALC 62 05/05/2023   ALT 15 05/05/2023   AST 18 05/05/2023   NA 140 12/14/2023   K 4.2 12/14/2023   CL 102 12/14/2023   CREATININE 1.03 12/14/2023   BUN 10 12/14/2023   CO2 23 12/14/2023   TSH 0.47 05/05/2023   PSA 2.36 05/05/2023   INR 1.72 (H) 07/05/2010   HGBA1C 5.4 12/15/2023  MICROALBUR 2.6 (H) 05/05/2023   Assessment/Plan:  Harold [name] is a 58 y.o. Black or African American [2] male with  has a past medical history of Acute idiopathic pericarditis (04/08/2010), Acute sinusitis (06/04/2014), Anxiety, CHF (congestive heart failure) (HCC) (06/29/2010), Chronic back pain, Constipation, DDD (degenerative disc disease), lumbar (09/14/2012), Depression, Erectile dysfunction (09/14/2012), GERD (gastroesophageal reflux disease), Gout, unspecified (05/01/2007), Headache, History of pancreatitis, Hypertension, Hypertriglyceridemia, Left sided sciatica (09/14/2012), Neuropathy, Obesity, OSA (obstructive sleep apnea), Restless legs, and Type 2 diabetes mellitus with diabetic neuropathy, unspecified (HCC) (09/20/2019).  Multifactorial gait disorder Gave rx for scooter to pt  Type 2 diabetes mellitus with diabetic neuropathy, unspecified (HCC) Lab Results  Component Value Date   HGBA1C 5.4 12/15/2023   Stable, pt to continue current medical treatment mounjaro  12.5 weekly  With neuropathy  Sinusitis Mild to mod, for antibx course zpack, prom DM prn cough,  to f/u any worsening symptoms or concerns  Allergies Mild to mod, for depomedrol 80 mg IM,  to f/u any worsening symptoms or concerns  Followup: Return in 4 months (on 05/27/2024), or if symptoms worsen or fail to improve.  Lynwood Rush, MD 02/10/2024 3:50 PM [facility]

## 2024-02-10 ENCOUNTER — Encounter: Payer: Self-pay | Admitting: Internal Medicine

## 2024-02-10 NOTE — Assessment & Plan Note (Addendum)
 Lab Results  Component Value Date   HGBA1C 5.4 12/15/2023   Stable, pt to continue current medical treatment mounjaro  12.5 weekly  With neuropathy

## 2024-02-10 NOTE — Assessment & Plan Note (Signed)
 Gave rx for scooter to pt

## 2024-02-10 NOTE — Assessment & Plan Note (Signed)
 Mild to mod, for antibx course zpack, prom DM prn cough,  to f/u any worsening symptoms or concerns

## 2024-02-10 NOTE — Assessment & Plan Note (Signed)
Mild to mod, for depomedrol 80 mg IM, to f/u any worsening symptoms or concerns 

## 2024-02-19 ENCOUNTER — Ambulatory Visit: Admitting: Podiatry

## 2024-02-26 ENCOUNTER — Ambulatory Visit: Admitting: Podiatry

## 2024-02-28 DIAGNOSIS — G5601 Carpal tunnel syndrome, right upper limb: Secondary | ICD-10-CM | POA: Diagnosis not present

## 2024-03-11 ENCOUNTER — Encounter: Payer: Self-pay | Admitting: Podiatry

## 2024-03-11 ENCOUNTER — Ambulatory Visit: Admitting: Podiatry

## 2024-03-11 VITALS — Ht 71.0 in | Wt 242.0 lb

## 2024-03-11 DIAGNOSIS — M79675 Pain in left toe(s): Secondary | ICD-10-CM | POA: Diagnosis not present

## 2024-03-11 DIAGNOSIS — M79674 Pain in right toe(s): Secondary | ICD-10-CM | POA: Diagnosis not present

## 2024-03-11 DIAGNOSIS — M19072 Primary osteoarthritis, left ankle and foot: Secondary | ICD-10-CM | POA: Diagnosis not present

## 2024-03-11 DIAGNOSIS — M19071 Primary osteoarthritis, right ankle and foot: Secondary | ICD-10-CM | POA: Diagnosis not present

## 2024-03-11 DIAGNOSIS — B351 Tinea unguium: Secondary | ICD-10-CM | POA: Diagnosis not present

## 2024-03-11 MED ORDER — TERBINAFINE HCL 250 MG PO TABS: 250.0000 mg | ORAL_TABLET | Freq: Every day | ORAL | 0 refills | Status: AC

## 2024-03-11 MED ORDER — BETAMETHASONE SOD PHOS & ACET 6 (3-3) MG/ML IJ SUSP: 3.0000 mg | Freq: Once | INTRAMUSCULAR | Status: AC

## 2024-03-11 NOTE — Progress Notes (Signed)
 Chief Complaint  Patient presents with   Nail Problem    Pt is here due to right second toenail, he states the nail is growing into the skin and is bothering him.    SUBJECTIVE Patient with a history of diabetes mellitus presents to office today complaining of elongated, thickened nails that cause pain while ambulating in shoes.  Patient is unable to trim their own nails.   Patient also states that the injection she received last visit to his first MTP bilateral helped significantly.  The pain is slowly returning.  This provided a few months of relief.  He is requesting injections today patient is here for further evaluation and treatment.   Past Medical History:  Diagnosis Date   Acute idiopathic pericarditis 04/08/2010   Qualifier: Diagnosis of  By: Lelon RIGGERS, [name]     Acute sinusitis 06/04/2014   Anxiety    CHF (congestive heart failure) (HCC) 06/29/2010   was pericardititis not CHF   Chronic back pain    Constipation    DDD (degenerative disc disease), lumbar 09/14/2012   Depression    pt denies   Erectile dysfunction 09/14/2012   GERD (gastroesophageal reflux disease)    ocassional   Gout, unspecified 05/01/2007   Qualifier: Diagnosis of  By: Krystal MD, Reyes A    Headache    migraines in the past   History of pancreatitis    a. admx 04-2009.SABRASABRA? 2-2 triglycerides   Hypertension    Hypertriglyceridemia    a. followed by [facility]   Left sided sciatica 09/14/2012   Neuropathy    Obesity    OSA (obstructive sleep apnea)    CPAP- not current   Restless legs    Type 2 diabetes mellitus with diabetic neuropathy, unspecified (HCC) 09/20/2019    OBJECTIVE General Patient is awake, alert, and oriented x 3 and in no acute distress. Derm Skin is dry and supple bilateral. Negative open lesions or macerations. Remaining integument unremarkable. Nails are tender, long, thickened and dystrophic with subungual debris, consistent with onychomycosis, 1-5 bilateral. No  signs of infection noted. Vasc  DP and PT pedal pulses palpable bilaterally. Temperature gradient within normal limits.  Neuro Light touch and protective threshold sensation diminished bilaterally.  Musculoskeletal Exam pain on palpation range of motion of the first MTP bilateral  ASSESSMENT 1. Diabetes Mellitus w/ peripheral neuropathy 2.  Pain due to onychomycosis of toenails bilateral 3.  Hallux limitus/MTP arthritis bilateral  PLAN OF CARE -Patient evaluated today. -Instructed to maintain good pedal hygiene and foot care. Stressed importance of controlling blood sugar.  -Mechanical debridement of nails 1-5 bilaterally performed using a nail nipper.  -Injection of 0.5 cc Celestone  Soluspan injected in the first MTP bilateral  -Today we discussed different treatment options for toenail fungus including oral, topical, and laser antifungal treatment modalities.  Relative efficacy's as well as risks and benefits associated with each modality were explained.  The patient would really like to have his toenails treated for the fungus.  Patient opts for oral antifungal treatment.  Most recent hepatic function WNL  -Prescription for Lamisil  250 mg #90 daily. -Return to clinic in 3 mos. for routine foot care  *Goes by Herlene Thresa EMERSON Janit, DPM [facility]  Dr. Thresa EMERSON Janit, DPM    [address]  [place], [place] [address]                Office [phone]  Fax [phone]

## 2024-03-16 ENCOUNTER — Other Ambulatory Visit: Payer: Self-pay | Admitting: Internal Medicine

## 2024-03-18 ENCOUNTER — Other Ambulatory Visit: Payer: Self-pay

## 2024-06-05 ENCOUNTER — Ambulatory Visit: Admitting: Physician Assistant

## 2024-10-30 ENCOUNTER — Ambulatory Visit
